# Patient Record
Sex: Female | Born: 1965 | Race: White | Hispanic: No | Marital: Married | Smoking: Never smoker
Health system: Southern US, Community
[De-identification: ages and names within clinical notes are randomized; demographics above are authoritative.]

## PROBLEM LIST (undated history)

## (undated) DIAGNOSIS — F419 Anxiety disorder, unspecified: Secondary | ICD-10-CM

## (undated) HISTORY — PX: HERNIA REPAIR: SHX51

---

## 1999-06-06 ENCOUNTER — Inpatient Hospital Stay (HOSPITAL_COMMUNITY): Admission: AD | Admit: 1999-06-06 | Discharge: 1999-06-08 | Payer: Self-pay | Admitting: Obstetrics and Gynecology

## 1999-06-13 ENCOUNTER — Encounter: Admission: RE | Admit: 1999-06-13 | Discharge: 1999-09-11 | Payer: Self-pay | Admitting: Obstetrics and Gynecology

## 1999-07-28 ENCOUNTER — Other Ambulatory Visit: Admission: RE | Admit: 1999-07-28 | Discharge: 1999-07-28 | Payer: Self-pay | Admitting: Obstetrics and Gynecology

## 2000-12-07 ENCOUNTER — Encounter (INDEPENDENT_AMBULATORY_CARE_PROVIDER_SITE_OTHER): Payer: Self-pay | Admitting: Specialist

## 2000-12-07 ENCOUNTER — Ambulatory Visit (HOSPITAL_COMMUNITY): Admission: RE | Admit: 2000-12-07 | Discharge: 2000-12-07 | Payer: Self-pay | Admitting: *Deleted

## 2004-03-31 ENCOUNTER — Inpatient Hospital Stay (HOSPITAL_COMMUNITY): Admission: RE | Admit: 2004-03-31 | Discharge: 2004-04-03 | Payer: Self-pay | Admitting: Obstetrics and Gynecology

## 2004-03-31 ENCOUNTER — Encounter (INDEPENDENT_AMBULATORY_CARE_PROVIDER_SITE_OTHER): Payer: Self-pay | Admitting: *Deleted

## 2004-04-04 ENCOUNTER — Encounter: Admission: RE | Admit: 2004-04-04 | Discharge: 2004-04-25 | Payer: Self-pay | Admitting: Obstetrics and Gynecology

## 2004-05-09 ENCOUNTER — Other Ambulatory Visit: Admission: RE | Admit: 2004-05-09 | Discharge: 2004-05-09 | Payer: Self-pay | Admitting: Obstetrics and Gynecology

## 2005-07-07 ENCOUNTER — Other Ambulatory Visit: Admission: RE | Admit: 2005-07-07 | Discharge: 2005-07-07 | Payer: Self-pay | Admitting: Obstetrics and Gynecology

## 2005-07-31 ENCOUNTER — Ambulatory Visit: Payer: Self-pay | Admitting: Internal Medicine

## 2006-09-20 ENCOUNTER — Ambulatory Visit: Payer: Self-pay | Admitting: Pulmonary Disease

## 2009-07-06 DIAGNOSIS — E669 Obesity, unspecified: Secondary | ICD-10-CM | POA: Insufficient documentation

## 2009-07-06 DIAGNOSIS — E785 Hyperlipidemia, unspecified: Secondary | ICD-10-CM | POA: Insufficient documentation

## 2009-07-13 ENCOUNTER — Ambulatory Visit: Payer: Self-pay | Admitting: Cardiology

## 2010-04-10 ENCOUNTER — Encounter: Payer: Self-pay | Admitting: Obstetrics and Gynecology

## 2010-04-21 NOTE — Assessment & Plan Note (Signed)
Summary: EC6/ELEVATED LIPIDS/SEEN DR ROSS IN THE PAST/JML  Medications Added SIMVASTATIN 40 MG TABS (SIMVASTATIN) 1 at bedtime      Allergies Added: NKDA  Visit Type:  Initial Consult Primary Provider:  Dr Henderson Cloud  CC:  hyperlipidemia.  History of Present Illness: Felicia Chen comes in today per the request of Dr.Tomblin for the evaluation and management of a severe mixed hyperlipidemia, obesity, and a premature family history of coronary artery disease.  She was evaluated by Dr. Tenny Craw in May of 2007. At that time she was placed on Vytorin. She discontinued it because she was required to come the office for a refill. 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   She is having no symptoms of angina or ischemia. She has 5 children, youngest of 4, and readily admits she allows no time for herself. She does not exercise and she isn't eating late at night with unhealthy foods including ice cream. Her weight is going up.  She is very emotional today her the fact she is failing to take care of herself. She really seems to understand diets, the importance of exercise, and taking her medications but has not had the discipline   She is having no polyuria, polydipsia, dry mouth, weight loss, orthopnea, PND, or edema.  Her last numbers showed a total cholesterol of 281, triglycerides 153, HDL 47, LDL 203 and remarkably a normal VLDL. Her glucose was normal TSH was normal hemoglobin A1c was 6%.  There is no history of liver disease.  Current Medications (verified): 1)  Multivitamins   Tabs (Multiple Vitamin) .Marland Kitchen.. 1 Tab Once Daily 2)  Zoloft 50 Mg Tabs (Sertraline Hcl) .Marland Kitchen.. 1 Tab Once Daily  Allergies (verified): No Known Drug Allergies  Past History:  Past Medical History: Last updated: 02-Aug-2009 CORONARY ARTERY DISEASE, PREMATURE, FAMILY HX (ICD-V17.3) HYPERLIPIDEMIA (ICD-272.4) OBESITY (ICD-278.00)  Past Surgical History: Last updated: 08/02/2009 Repair of umbilical hernia  with mesh. Cesearan  Family History: Last updated: Aug 02, 2009 Father: deceased @ 41...had MI @ 74 and CABG @ 61 Family History of Hyperlipidemia:  Family History of Hypertension:   Social History: Last updated: 02-Aug-2009 Tobacco Use - No.  Alcohol Use - no Drug Use - no Married  Regular Exercise - yes  Risk Factors: Exercise: yes (2009/08/02)  Risk Factors: Smoking Status: never (08-02-2009)  Review of Systems       negative other than history of present illness  Vital Signs:  Patient profile:   45 year old female Height:      68 inches Weight:      258 pounds BMI:     39.37 Pulse rate:   81 / minute BP sitting:   126 / 72  (left arm) Cuff size:   large  Vitals Entered By: Hardin Negus, RMA (July 13, 2009 11:10 AM)  Physical Exam  General:  obese.  no acute distress, emotional Head:  normocephalic and atraumatic Eyes:  PERRLA/EOM intact; conjunctiva and lids normal. Mouth:  Teeth, gums and palate normal. Oral mucosa normal. Neck:  Neck supple, no JVD. No masses, thyromegaly or abnormal cervical nodes. Chest Shea Swalley:  no deformities or breast masses noted Lungs:  Clear bilaterally to auscultation and percussion. Heart:  Non-displaced PMI, chest non-tender; regular rate and rhythm, S1, S2 without murmurs, rubs or gallops. Carotid upstroke normal, no bruit. Normal abdominal aortic size, no bruits. Femorals normal pulses, no bruits. Pedals normal pulses. No edema, no varicosities. Msk:  Back normal, normal gait. Muscle strength and tone  normal. Pulses:  pulses normal in all 4 extremities Extremities:  No clubbing or cyanosis. Neurologic:  Alert and oriented x 3. Skin:  Intact without lesions or rashes. Psych:  Normal affect.   EKG  Procedure date:  07/13/2009  Findings:      normal sinus rhythm, low voltage.  Impression & Recommendations:  Problem # 1:  HYPERLIPIDEMIA (ICD-272.4) Assessment Deteriorated I have had a long discussion with the patient  today. I recommended simvastatin 40 mg q.h.s. because she cannot afford a torn. Our goal is to lower total cholesterol less than 200 and her LDL less than 1:30 or greater. We will check followup blood work in 6 weeks. I will see her back on an annual basis in order to renew his prescription. The following medications were removed from the medication list:    Vytorin 10-10 Mg Tabs (Ezetimibe-simvastatin) .Marland Kitchen... 1 tab at bedtime Her updated medication list for this problem includes:    Simvastatin 40 Mg Tabs (Simvastatin) .Marland Kitchen... 1 at bedtime  The following medications were removed from the medication list:    Vytorin 10-10 Mg Tabs (Ezetimibe-simvastatin) .Marland Kitchen... 1 tab at bedtime  Problem # 2:  OBESITY (ICD-278.00) Assessment: Deteriorated II have spent a great amount of time, greater than 30 minutes, talking about therapeutic and healthy lifestyle changes. This includes walking 3 hours per week, take her medication, and not gaining anymore weight. I think is safe to say that she would not lose weight based on her history. She agrees with this. Orders: EKG w/ Interpretation (93000)  Problem # 3:  CORONARY ARTERY DISEASE, PREMATURE, FAMILY HX (ICD-V17.3) Assessment: Unchanged  Orders: EKG w/ Interpretation (93000)  Patient Instructions: 1)  Your physician recommends that you schedule a follow-up appointment in: YEAR  WITH DR Kota Ciancio 2)  Your physician recommends that you return for lab work in:6 WEEKS LIPID LIVER 272.4 V58.69 MID JUNE FOR FASTING LABS 8:30  3)  Your physician has recommended you make the following change in your medication: SIMVASTATIN 40 MG Prescriptions: SIMVASTATIN 40 MG TABS (SIMVASTATIN) 1 at bedtime  #30 x 11   Entered by:   Scherrie Bateman, LPN   Authorized by:   Gaylord Shih, MD, [facility]   Signed by:   Scherrie Bateman, LPN on 16/12/9602   Method used:   Electronically to        [facility] [phone]* (retail)       [address]       [place], [place]  [address]       Ph:  [phone] or [phone]       Fax: [phone]   RxID:   [id number]

## 2010-08-02 NOTE — Assessment & Plan Note (Signed)
[facility]                             PULMONARY OFFICE NOTE   Felicia Chen, Felicia Chen                    MRN:          [id number]  DATE:09/20/2006                            DOB:          1965-09-01    SLEEP MEDICINE CONSULTATION:   HISTORY OF PRESENT ILLNESS:  The patient is a 45 year old female who I  have been asked to see for sleeping difficulties.  The patient states  that she has been told that she snores but no one has ever mentioned  pauses in her breathing during sleep.  The patient typically goes to bed  between 10 and 11 and gets up at 6 a.m. to start her day during the  school year and 7 to 8 a.m. during the summer.  She is not rested upon  arising.  She will awaken 3-5 times a night and really does not know  why.  She denies any leg kicking but she admits that she is in a king-  size bed and it may not be felt by her bed partner.  She denies any  symptoms consistent with a restless legs syndrome.  The patient does  have two sisters with obstructive sleep apnea, who are on CPAP.  She  currently works within the home and denies any significant sleep  pressure during the day.  She does have some fuzziness with periods of  inactivity.  She does note decreased energy level in the afternoon and  early evening.  She may doze in the evening with TV or movies.  She has  no difficulties driving.  The patient also notes that she has gained  about 20 pounds over the last 2 years.   PAST MEDICAL HISTORY:  Significant for hypercholesterolemia and tubal  ligation, otherwise is unremarkable.   CURRENT MEDICATIONS:  1. Zoloft 50 mg daily.  2. Vytorin 10/10 mg daily.   She has no known drug allergies.   SOCIAL HISTORY:  She is married and has children.  She has never smoked.   FAMILY HISTORY:  Remarkable for father having had heart disease as well  as prostate cancer.  Her mother had uterine cancer.   REVIEW OF SYSTEMS:  As per history of  present illness, also patient  intake form documented on the chart.   PHYSICAL EXAM:  GENERAL:  She is a morbidly obese female in no acute  distress.  Blood pressure is 112/70, pulse 77, temperature is 98.7, weight is 240  pounds.  O2 saturation on room air is 96%.  HEENT:  Pupils equal, round and reactive to light and accommodation.  Extraocular muscles were intact.  Nares are patent without discharge but  do show mild septal deviation to the left.  Oropharynx does show mild  elongation of the soft palate and uvula.  NECK:  Supple without JVD or lymphadenopathy.  There is no palpable  thyromegaly.  CHEST:  Totally clear.  CARDIAC:  Regular rate and rhythm, no murmurs, rubs or gallops.  ABDOMEN:  Soft, nontender, with good bowel sounds.  GENITAL, RECTAL, BREASTS:  Exams not done and not indicated.  Lower extremities  are without edema.  Pulses are intact distally.  NEUROLOGIC:  Alert and oriented with no obvious motor deficits.   IMPRESSION:  Possible obstructive sleep apnea.  The patient certainly  has disrupted sleep as well as snoring and is not rested in the  mornings.  However, she seems to be functioning fairly well during the  day and does not feel this is overly impacting her quality of life.  The  patient does not have a lot of cardiovascular morbidities.  Because of  all this, I think that we could either go ahead and proceed with a sleep  study or also take 6 months and work aggressively on weight loss and see  if things get better.  After giving the patient the option, she would  like to work on weight loss for the next 6 months and see how things go.   PLAN:  The patient will work on weight loss for the next months.  If she  is not seeing improvement, then she will contact me about getting set up  for nocturnal polysomnography.     Barbaraann Share, MD,FCCP  Electronically Signed    [facility]/MedQ  DD: 12/25/2006  DT: 12/26/2006  Job #: [id number]   cc:   Guy Sandifer. Henderson Cloud,  M.D.

## 2010-08-05 ENCOUNTER — Other Ambulatory Visit: Payer: Self-pay | Admitting: Cardiology

## 2010-08-05 NOTE — Op Note (Signed)
[facility]  Patient:    Felicia Chen, Felicia Chen Visit Number: [id number] MRN: [id number]          Service Type: DSU Location: DAY Attending Physician:  Vikki Ports. Dictated by:   Vikki Ports, M.D. Proc. Date: 12/07/00 Admit Date:  12/07/2000                             Operative Report  PREOPERATIVE DIAGNOSIS:  Umbilical mass.  POSTOPERATIVE DIAGNOSIS:  Umbilical hernia and cyst.  PROCEDURE:  Repair of umbilical hernia with mesh.  SURGEON:  Vikki Ports, M.D.  ANESTHESIA:  General.  DESCRIPTION OF PROCEDURE:  The patient was taken to the operating room and placed in the supine position.  After adequate general anesthesia was induced using laryngeal mask, the abdomen was prepped and draped in the normal sterile fashion.  Using a transverse infraumbilical incision, I dissected down on to the umbilical stalk.  There was a blue hemorrhagic cyst on the umbilical stalk, and as it was being dissected off, there was a small button hole deformity made in the inferior portion of the umbilicus.  This was repaired with a 4-0 Vicryl subcuticularly.  The hernia sac was then dissected free of surrounding structures and excised.  There was a small amount of momentum within the hernia.  The defect was only the size of my fingertip.  It was closed with figure-of-eight 0 Surgilon sutures.  A piece of Atrium mesh was then placed over the repair and tacked on the periphery using a 2-0 Prolene suture.  All tissues were injected using 0.5 Marcaine.  The skin was closed with subcuticular 4-0 Monocryl.  Dermabond dressing was placed.  A sterile dressing was applied.  The patient tolerated the procedure well and went to PACU in good condition. Dictated by:   Vikki Ports, M.D. Attending Physician:  Danna Hefty R. DD:  12/07/00 TD:  12/07/00 Job: [id number]

## 2010-08-05 NOTE — H&P (Signed)
[facility]  Patient:    Felicia Chen, Felicia Chen                  MRN: [id number] Adm. Date:  [id number] Attending:  Madelyn Flavors CC:         [facility]                         History and Physical  HISTORY OF PRESENT ILLNESS:   The patient is a 45 year old gravida 3, para 1-2-0-3 white female, [facility] June 15, 1999, admitted for elective repeat cesarean section. The patient declines a VBAC, having undergone a cesarean section with her first  delivery in 41 and then for twins in 1997.  Antenatal course has been totally and completely uncomplicated with the exception of some slight dilation of the left  fetal renal pelvis, which has been stable.  Patient is made aware of the risks f surgery including anesthetic complication, hemorrhage or infection, damage to adjacent structures including bladder, bowel, blood vessels or ureter.  She is ade aware of the risk of hemorrhage which could result in blood transfusion which could result in complications such as HIV or hepatitis.  She expresses understanding f and acceptances of these risks.  PAST MEDICAL HISTORY:         Preterm labor at 32 weeks with her twin pregnancy. C-sections x 2, as above.  Advanced maternal age.  FAMILY HISTORY:               Please see [place].  ALLERGIES:                    No known drug allergies.  MEDICATIONS:                  Prenatal vitamins.  PHYSICAL EXAMINATION:  NECK:                         Supple without thyromegaly.  LUNGS:                        Clear to auscultation.  CARDIAC:                      Regular rate and rhythm.  ABDOMEN:                      Gravida, nontender.  No hepatosplenomegaly.  ASSESSMENT AND PLAN:          Patient is a 45 year old gravida 3, para 1-2-0-3 white female, currently at 38-5/7 weeks, admitted for elective repeat cesarean section at term.  Risks have been explained to the patient and she  expresses understanding of and acceptance of these risks. DD:  06/06/99 TD:  06/06/99 Job: [id number]

## 2010-08-05 NOTE — Discharge Summary (Signed)
NAME:  Felicia, Chen NO.:  [id number]   MEDICAL RECORD NO.:  [id number]          PATIENT TYPE:  INP   LOCATION:  9105                           FACILITY:   PHYSICIAN:  Freddy Finner, M.D.   DATE OF BIRTH:  1965-08-23   DATE OF ADMISSION:  03/31/2004  DATE OF DISCHARGE:  04/03/2004                                 DISCHARGE SUMMARY   ADMISSION DIAGNOSES:  1.  Intrauterine pregnancy at 38-1/2 weeks estimated gestational age.  2.  Previous cesarean section, desires repeat.  3.  Multiparity, desires permanent sterilization.   DISCHARGE DIAGNOSES:  1.  Status post low transverse cesarean section.  2.  Viable female infant.  3.  Permanent sterilization.   PROCEDURE:  1.  Repeat low transverse cesarean section.  2.  Bilateral tubal ligation.   REASON FOR ADMISSION:  Please see dictated H&P.   HOSPITAL COURSE:  The patient was a 45 year old, white, married female,  gravida 4, para 4 that had a previous cesarean section and desired repeat.  The patient also desired permanent sterilization.  On the morning of  admission, the patient was taken to the operating room where a spinal  anesthesia was administered without difficulty.  A low transverse incision  was made with the delivery of a viable female infant weighing 8 pounds 9  ounces with Apgars of 8 at 1 minute and 9 at 5 minutes.  Arterial cord pH  was 7.27.  Bilateral tubal ligation was performed without difficulty.  The  patient tolerated the procedure well and was taken to the recovery room in  stable condition.  On postoperative day #1, the patient was without  complaint.  She had good relief from the OnQ system.  Vital signs were  stable.  She was afebrile.  Abdomen soft with good return of bowel function.  The fundus was firm and non-tender.  Abdominal dressing was clean, dry, and  intact.  The Foley catheter had been discontinued.  The patient was voiding  with good output.  Laboratories revealed a  hemoglobin of 10.4.   On postoperative day #2, the patient reported poor results from OnQ system.  Vital signs were stable.  She was afebrile.  The fundus was firm and non-  tender.  Laboratories revealed hemoglobin of 10.4.  The dressing was  removed.  OnQ was removed.  The incision was clean, dry, and intact.  She  was ambulating well and tolerating a regular diet without complaints of  nausea or vomiting.  On postoperative day #3, the patient was without  complaint.  Vital signs were stable.  She was afebrile.  Her fundus was firm  and non-tender.  Her incision was clean, dry, and intact.  Staples were  removed and the patient was discharged home.   CONDITION ON DISCHARGE:  Good.   DIET:  Regular, as tolerated.   DISCHARGE ACTIVITIES:  No heavy lifting.  No driving x2 weeks.  No vaginal  entry.   FOLLOW UP:  The patient is to followup in the office in 1-2 weeks for an  incision check.  She is to call for temperature greater  than 100 degrees,  persistent nausea or vomiting, heavy vaginal bleeding, any redness or  drainage from the incisional site.   DISCHARGE MEDICATIONS:  1.  Percocet 5/325 mg #30 one p.o. q.4-6h. p.r.n.  2.  Motrin 600 mg every six hours.  3.  Prenatal vitamins one p.o. daily.  4.  Colace 100 mg daily p.r.n.      CC/MEDQ  D:  04/20/2004  T:  04/20/2004  Job:  [id number]

## 2010-08-05 NOTE — H&P (Signed)
NAME:  Felicia Chen, Felicia Chen NO.:  [id number]   MEDICAL RECORD NO.:  [id number]          PATIENT TYPE:  INP   LOCATION:  NA                            FACILITY:  [facility]   PHYSICIAN:  Guy Sandifer. Tomblin II, M.D.DATE OF BIRTH:  December 10, 1965   DATE OF ADMISSION:  DATE OF DISCHARGE:                                HISTORY & PHYSICAL   CHIEF COMPLAINT:  Desires repeat cesarean section.   HISTORY OF PRESENT ILLNESS:  This patient is a 45 year old married white  female, G4, P4 (twins), with an EDC of April 08, 2004, consistent with  early ultrasound.  Prenatal care has been complicated by three previous  cesarean sections.  Also evaluation for first trimester nuchal lucency  screening with Dr. Sherrie George was within normal limits.  Potential risks and  complications have been reviewed preoperatively.  The patient also requests  tubal ligation.  Permanence of the procedure, alternatives, increased  ectopic risk, and failure rate have been reviewed.   Past medical history, past surgical history, family history, obstetric  history:  See prenatal history and physical.   MEDICATIONS:  Prenatal vitamins.   ALLERGIES:  No known drug allergies.   PHYSICAL EXAMINATION:  VITAL SIGNS:  Height 5 feet 8 inches, weight 238  pounds, blood pressure 124/72.  HEENT:  Without thyromegaly.  LUNGS:  Clear to auscultation.  HEART:  Regular rate and rhythm.  BACK:  Without CVA tenderness.  BREASTS:  Not examined.  ABDOMEN:  Gravid.  No epigastric tenderness.  CERVIX:  Not examined.  EXTREMITIES:  2+ edema.   ASSESSMENT:  Intrauterine pregnancy at 38-1/2 weeks' estimated gestational  age with previous cesarean section.  Desires repeat.   PLAN:  Repeat cesarean section and tubal ligations.      JET/MEDQ  D:  03/30/2004  T:  03/30/2004  Job:  [id number]

## 2010-08-05 NOTE — Discharge Summary (Signed)
[facility]  Patient:    Felicia Chen, Felicia Chen                  MRN: [id number] Adm. Date:  [id number] Disc. Date: 06/08/99 Attending:  Madelyn Flavors Dictator:   Danie Chandler, R.N.                           Discharge Summary  ADMISSION DIAGNOSES:          1. Intrauterine pregnancy at term.                               2. Declines vaginal birth after cesarean section.  DISCHARGE DIAGNOSES:          1. Intrauterine pregnancy at term.                               2. Declines vaginal birth after cesarean section.  PROCEDURE:                    On June 06, 1999, repeat low transverse cesarean  section.  REASON FOR ADMISSION:         The patient is a 45 year old, gravida 3, para 2, married white female with an estimated date of confinement of June 15, 1999. he patient has been admitted to undergo elective repeat cesarean section.  The patient declines a VBAC.  Her antenatal course has been uncomplicated.  HOSPITAL COURSE:              The patient is taken to the operating room and undergoes the above named procedure without complications.  This is productive f a viable female infant with Apgars of 8 at one minute and 9 at five minutes and an arterial cord pH of 7.19.  Postoperatively, the patient does well.  On postoperative day #1, the patient had a good return of bowel function and was tolerating a regular diet.  She was also ambulating well without difficulty and had good pain control.  Her hemoglobin on this day was 9.5, hematocrit 27.5, and white blood cell count 7.9.  She was started on iron daily and had a repeat CBC ordered for postoperative day #2.  On postoperative day #2, the patient desired discharge home.  She was without complaint.  Her hemoglobin was stable at 10.5.  CONDITION ON DISCHARGE:       Good.  DIET:                         Regular as tolerated.  ACTIVITY:                     No heavy lifting, no driving,  and no vaginal entry. She is to follow up in the office in six days for staple removal and she is to all for a temperature greater than 100 degrees, persistent nausea and vomiting, heavy vaginal bleeding, and/or redness or drainage from the incision site.  DISCHARGE MEDICATIONS:        1. Prenatal vitamins one p.o. q.d.                               2. Tylox #30 one to two p.o. q.3-4h. p.r.n. pain.  3. Ibuprofen 600 mg p.o. q.6h. p.r.n. pain. DD:  06/08/99 TD:  06/08/99 Job: [id number]

## 2010-08-05 NOTE — Op Note (Signed)
[facility]  Patient:    Felicia Chen, Felicia Chen                  MRN: [id number] Proc. Date: 06/06/99 Adm. Date:  [id number] Attending:  Madelyn Flavors                           Operative Report  PREOPERATIVE DIAGNOSIS:  Intrauterine pregnancy, term, declines vaginal birth after cesarean section.  POSTOPERATIVE DIAGNOSIS:  Intrauterine pregnancy, term, declines vaginal birth after cesarean section.  OPERATION:  Repeat cesarean section, low cervical transverse.  SURGEON:  Beather Arbour. Thomasena Edis, M.D.  ASSISTANT:  Guy Sandifer. Arleta Creek, M.D.  ANESTHESIA:  Spinal.  ESTIMATED BLOOD LOSS:  800 cc.  FLUID REPLACEMENT:  3000 cc of crystalloid and 1 gm Ancef IV.  COMPLICATIONS:  None.  DRAINS:  Foley.  DESCRIPTION OF PROCEDURE:  The patient was brought to the operating room and identified on the operating table.  After induction of adequate spinal anesthesia, the patient was placed in the left uterine displacement position and prepped and draped in the usual sterile fashion.  A Pfannenstiel incision was made through he patients previous incision and carried down to the fascia.  The fascia was scored in the midline and extended bilaterally using Mayo scissors.  It was then separated free from the underlying muscles.  The muscles were separated in the midline down to the symphysis.  The peritoneum was entered sharply and carefully taking care to avoid bowel or other abdominal contents.  The peritoneal incision was extended superiorly and inferiorly down to the bladder edge.  The bladder blade was placed. The bladder flap was developed.  The uterus was scored in the lower uterine segment and the incision was extended in a curvilinear fashion using bandage scissors. The amniotic cavity was entered and there was noted to be clear fluid.  All instruments were removed from the operative and the fetal vertex was delivered via vacuum extraction onto  the operative field.  The infant was bulb suction and the remainder of the infant was delivered onto the operative field without difficulty. The cord was doubly clamped and cut and the baby was handed to the awaiting neonatal resuscitation team.  The placenta was manually removed.  The bladder was placed. Spring clamps were placed on the uterus and the uterus was closed using simple running interlocking suture of 0 Monocryl.  Figure-of-eight sutures of 0 Monocryl were placed for hemostasis of any bleeding edges of the uterine incision. After noting excellent hemostasis, the incision was copiously irrigated with warm lactated ringers.  The bladder flap was noted to be very hemostatic.  The gutters were irrigated free of clots.  At that point the subfascial areas were examined for evidence of hemostasis and adequate hemostasis was achieved using cautery.  The  fascia was closed using a suture of 0 PDS with a suture anchored at the leftmost angle of the incision and run to the rightmost angle of the incision and tied. The subcutaneous tissue was irrigated copiously with warm lactated Ringers after assuring excellent fascial closure.  The subcutaneous tissue was then irrigated  copiously with warm lactated Ringers and excellent hemostasis achieved using cautery.  Skin was closed with staples and Steri-Strips were applied.  The patient tolerated the procedure well without apparent complications and transferred to recovery room in stable condition after all sponge, instrument and needle counts were correct.  The baby was found to be  a 9 pound 2 ounce female Apgars 8 and 10, cord pH 7.19 and went to the normal newborn nursery. DD:  06/06/99 TD:  06/07/99 Job: [id number]

## 2010-08-05 NOTE — Op Note (Signed)
NAME:  Felicia Chen, Felicia Chen NO.:  [id number]   MEDICAL RECORD NO.:  [id number]          PATIENT TYPE:  INP   LOCATION:  9105                          FACILITY:  [facility]   PHYSICIAN:  Guy Sandifer. Tomblin II, M.D.DATE OF BIRTH:  1965-10-20   DATE OF PROCEDURE:  03/31/2004  DATE OF DISCHARGE:                                 OPERATIVE REPORT   PREOPERATIVE DIAGNOSIS:  1.  Intrauterine pregnancy at 5 1/2 weeks estimated gestational age.  2.  Previous cesarean section.  3.  Desires permanent sterilization.   POSTOPERATIVE DIAGNOSIS:  1.  Intrauterine pregnancy at 66 1/2 weeks estimated gestational age.  2.  Previous cesarean section.  3.  Desires permanent sterilization.   PROCEDURE:  1.  Repeat low transverse cesarean section.  2.  Bilateral tubal ligation.  3.  ON-Q system.   SURGEON:  Guy Sandifer. Henderson Cloud, M.D.   ANESTHESIA:  Spinal.   ANESTHESIOLOGIST:  Belva Agee, M.D.   ESTIMATED BLOOD LOSS:  800 mL.   SPECIMENS:  Bilateral fallopian tube segments.   FINDINGS:  Viable female infant, Apgars 8 and 9 at 1 and 5 minutes  respectively, birth weight 8 pounds 9 ounces.  Arterial cord pH 7.27.   INDICATIONS AND CONSENT:  This patient is a 45 year old married white  female, G4, P4, (twins) with an EDC of April 08, 2004.  She has had a  previous cesarean section and desires repeat.  She also desires permanent  sterilization.  Alternate methods of contraception have been reviewed.  The  potential risks and complications are reviewed preoperatively including but  not limited to infection, bowel, bladder, or ureteral damage, bleeding  requiring transfusion of blood products and possible transfusion reaction,  HIV, and hepatitis acquisition, DVT, PE, and pneumonia.  Permanence of tubal  ligation, failure rate, and increased ectopic risks are also reviewed.  All  questions are answered and consent is signed on the chart.   DESCRIPTION OF PROCEDURE:  The patient was  taken to the operating room where  she was identified, spinal anesthetic was placed, and she was placed in a  dorsal supine position with a 15 degree left lateral wedge.  She is then  prepped, Foley catheter placed and the bladder is drained, and she was  draped in a sterile fashion.  After testing for adequate spinal incision,  the skin was entered through the previous Pfannenstiel scar and dissection  was carried out in layers to the peritoneum.  The adhesions in the  subfascial layer are quite dense.  The vesicouterine peritoneum was taken  down cephalolaterally.  The bladder flap was developed and the bladder blade  was placed.  The uterus was incised in a low transverse manner and the  uterine cavity was entered bluntly with a hemostat.  The uterine incision  was extended cephalolaterally with the fingers.  Artificial rupture of  membranes for clear fluid was carried out.  The baby is in the oblique  position with the head in the right lower quadrant.  The vertex continued to  try to turn transverse.  After exposing the vertex, the Tender Touch Vacuum  Extractor was placed.  This brings the head in the incision without  difficulty.  A small T-extension approximately 3 cm in the midline in the  upper part of the incision was also done to create adequate room and then  the vertex easily delivers.  Oronasopharynx was suctioned.  The remainder of  the baby is delivered.  A good cry and tone was noted.  The cord was clamped  and cut and the baby was handed to the awaiting pediatrics team.  The  placenta was then delivered.  The uterine cavity was cleaned.  The T-shaped  incision of the uterine extension is closed with two running locking layers  of 0 Monocryl suture which achieved good hemostasis.  The uterine incision  was next closed in two running locking imbricating layers of 0 Monocryl.  The left fallopian tube was then identified from cornua to fimbria.  There  were several filmy  adhesions of the tube which were taken down sharply  without difficulty.  The fallopian tube was then grasped in its mid portion  with a Babcock clamp.  The intervening knuckle of tube was then doubly  ligated with two free ties of 0 plain suture.  The intervening knuckle was  then resected.  Cautery was used to obtain complete hemostasis.  A similar  procedure was carried out on the right tube, although there were no  adhesions on this side.  Good hemostasis is noted on both sides.  After  irrigation, the anterior peritoneum was closed in a running fashion with 0  Monocryl suture which was also used to reapproximate the pyramidalis muscle  in the midline.  The anterior rectus fascia was closed in a running fashion  with 0 PDS suture.  A port for the ON-Q is then placed to the left of the  midline above the incision and the catheter is placed subcutaneously.  The  incision is then closed with 3-0 Monocryl in a subcuticular fashion.  The  catheter is primed.  Dressings are applied.  All counts were correct.  The  patient is taken to the recovery room in stable condition.      JET/MEDQ  D:  03/31/2004  T:  03/31/2004  Job:  [id number]

## 2010-11-30 ENCOUNTER — Other Ambulatory Visit: Payer: Self-pay | Admitting: Cardiology

## 2010-12-24 ENCOUNTER — Other Ambulatory Visit: Payer: Self-pay | Admitting: Cardiology

## 2011-07-28 ENCOUNTER — Other Ambulatory Visit: Payer: Self-pay | Admitting: Cardiology

## 2011-08-24 ENCOUNTER — Other Ambulatory Visit: Payer: Self-pay | Admitting: Cardiology

## 2011-10-06 ENCOUNTER — Other Ambulatory Visit: Payer: Self-pay | Admitting: Cardiology

## 2011-12-14 ENCOUNTER — Other Ambulatory Visit: Payer: Self-pay | Admitting: Cardiology

## 2011-12-14 NOTE — Telephone Encounter (Signed)
LMOM for pt to call to make appt

## 2012-05-27 ENCOUNTER — Encounter (HOSPITAL_COMMUNITY): Payer: Self-pay

## 2012-06-05 ENCOUNTER — Encounter (HOSPITAL_COMMUNITY)
Admission: RE | Admit: 2012-06-05 | Discharge: 2012-06-05 | Disposition: A | Discharge status: Home / Self Care | Payer: Managed Care, Other (non HMO) | Source: Ambulatory Visit | Attending: Obstetrics and Gynecology | Admitting: Obstetrics and Gynecology

## 2012-06-05 ENCOUNTER — Encounter (HOSPITAL_COMMUNITY): Payer: Self-pay

## 2012-06-05 HISTORY — DX: Gilbert syndrome: E80.4

## 2012-06-05 HISTORY — DX: Anxiety disorder, unspecified: F41.9

## 2012-06-05 LAB — CBC
HCT: 39.7 % (ref 36.0–46.0)
Hemoglobin: 13.7 g/dL (ref 12.0–15.0)
MCHC: 34.5 g/dL (ref 30.0–36.0)
MCV: 88 fL (ref 78.0–100.0)
RDW: 13 % (ref 11.5–15.5)
WBC: 9.9 10*3/uL (ref 4.0–10.5)

## 2012-06-05 LAB — SURGICAL PCR SCREEN: Staphylococcus aureus: NEGATIVE

## 2012-06-05 NOTE — Patient Instructions (Signed)
Your procedure is scheduled on: 06/11/12  Enter through the Main Entrance at :6am Pick up desk phone and dial [phone] and inform us of your arrival.  Please call [phone] if you have any problems the morning of surgery.  Remember: Do not eat or drink after midnight:Monday   DO NOT wear jewelry, eye make-up, lipstick,body lotion, or dark fingernail polish. Do not shave for 48 hours prior to surgery.  If you are to be admitted after surgery, leave suitcase in car until your room has been assigned. Patients discharged on the day of surgery will not be allowed to drive home.

## 2012-06-07 NOTE — H&P (Signed)
Felicia Chen is an 47 y.o. female. She has known fibroids and increasingly heavy bleeding, sometimes for more than a week. U/S C/W multiple fibroids up to 4 cm. Ovaries appear normal.  Pertinent Gynecological History: Menses: flow is excessive with use of many pads or tampons on heaviest days Bleeding: dysfunctional uterine bleeding Contraception: tubal ligation DES exposure: denies Blood transfusions: none Sexually transmitted diseases: no past history Previous GYN Procedures: none  Last mammogram: normal Date: 2013 Last pap: normal Date: 2013 OB History: G4, P5   Menstrual History: Menarche age: unknown  No LMP recorded.    Past Medical History  Diagnosis Date  . Anxiety   . Gilbert's syndrome     Past Surgical History  Procedure Laterality Date  . Hernia repair    . Cesarean section      x 4    No family history on file.  Social History:  reports that she has never smoked. She does not have any smokeless tobacco history on file. She reports that  drinks alcohol. She reports that she does not use illicit drugs.  Allergies: No Known Allergies  No prescriptions prior to admission    Review of Systems  Constitutional: Negative for fever.  Respiratory: Negative for shortness of breath.     There were no vitals taken for this visit. Physical Exam  Constitutional: She appears well-developed and well-nourished.  Cardiovascular: Normal rate and regular rhythm.   Respiratory: Effort normal and breath sounds normal.  GI: There is no tenderness.  Genitourinary:  Uterus 12-14 weeks size Adnexa NT without masses  Neurological: She has normal reflexes.    No results found for this or any previous visit (from the past 24 hour(s)).  No results found.  Assessment/Plan: 47 yo with menorrhagia and fibroids for LAVH/BSO. Risks reviewed including infection, organ damage, DVT/PE, pneumonia, bleeding/transfusion-HIV/Hep, fistula, pelvic/abdominal pain, dyspareunia,  laparotomy. All questions answered.  Felicia Chen,Evanne Matsunaga E 06/07/2012, 12:44 PM

## 2012-06-10 ENCOUNTER — Encounter (HOSPITAL_COMMUNITY): Payer: Self-pay | Admitting: Anesthesiology

## 2012-06-10 NOTE — Anesthesia Preprocedure Evaluation (Addendum)
Anesthesia Evaluation  Patient identified by MRN, date of birth, ID band Patient awake    Reviewed: Allergy & Precautions, H&P , NPO status , Patient's Chart, lab work & pertinent test results  Airway Mallampati: III TM Distance: >3 FB Neck ROM: Full    Dental no notable dental hx. (+) Teeth Intact   Pulmonary neg pulmonary ROS,  Snores occasionally breath sounds clear to auscultation        Cardiovascular negative cardio ROS  Rhythm:Regular Rate:Normal     Neuro/Psych Anxiety negative neurological ROS  negative psych ROS   GI/Hepatic negative GI ROS, Gilbert's Syndrome   Endo/Other  Morbid obesityHyperlipidemia  Renal/GU negative Renal ROS   SUI    Musculoskeletal negative musculoskeletal ROS (+)   Abdominal (+) + obese,   Peds  Hematology negative hematology ROS (+)   Anesthesia Other Findings   Reproductive/Obstetrics Fibroids                           Anesthesia Physical Anesthesia Plan  ASA: III  Anesthesia Plan: General   Post-op Pain Management:    Induction: Intravenous  Airway Management Planned: Oral ETT  Additional Equipment:   Intra-op Plan:   Post-operative Plan: Extubation in OR  Informed Consent: I have reviewed the patients History and Physical, chart, labs and discussed the procedure including the risks, benefits and alternatives for the proposed anesthesia with the patient or authorized representative who has indicated his/her understanding and acceptance.   Dental advisory given  Plan Discussed with: CRNA, Anesthesiologist and Surgeon  Anesthesia Plan Comments:         Anesthesia Quick Evaluation

## 2012-06-11 ENCOUNTER — Inpatient Hospital Stay (HOSPITAL_COMMUNITY)
Admission: RE | Admit: 2012-06-11 | Discharge: 2012-06-13 | DRG: 743 | Disposition: A | Discharge status: Home / Self Care | Payer: Managed Care, Other (non HMO) | Source: Ambulatory Visit | Attending: Obstetrics and Gynecology | Admitting: Obstetrics and Gynecology

## 2012-06-11 ENCOUNTER — Ambulatory Visit (HOSPITAL_COMMUNITY): Payer: Managed Care, Other (non HMO) | Admitting: Anesthesiology

## 2012-06-11 ENCOUNTER — Encounter (HOSPITAL_COMMUNITY): Payer: Self-pay | Admitting: Anesthesiology

## 2012-06-11 ENCOUNTER — Encounter (HOSPITAL_COMMUNITY)
Admission: RE | Disposition: A | Discharge status: Home / Self Care | Payer: Self-pay | Source: Ambulatory Visit | Attending: Obstetrics and Gynecology

## 2012-06-11 ENCOUNTER — Encounter (HOSPITAL_COMMUNITY): Payer: Self-pay | Admitting: *Deleted

## 2012-06-11 DIAGNOSIS — N803 Endometriosis of pelvic peritoneum, unspecified: Secondary | ICD-10-CM | POA: Diagnosis present

## 2012-06-11 DIAGNOSIS — N838 Other noninflammatory disorders of ovary, fallopian tube and broad ligament: Secondary | ICD-10-CM | POA: Diagnosis present

## 2012-06-11 DIAGNOSIS — Z5331 Laparoscopic surgical procedure converted to open procedure: Secondary | ICD-10-CM

## 2012-06-11 DIAGNOSIS — D259 Leiomyoma of uterus, unspecified: Secondary | ICD-10-CM | POA: Diagnosis present

## 2012-06-11 DIAGNOSIS — IMO0002 Reserved for concepts with insufficient information to code with codable children: Secondary | ICD-10-CM | POA: Diagnosis present

## 2012-06-11 DIAGNOSIS — N736 Female pelvic peritoneal adhesions (postinfective): Secondary | ICD-10-CM | POA: Diagnosis present

## 2012-06-11 DIAGNOSIS — N920 Excessive and frequent menstruation with regular cycle: Principal | ICD-10-CM | POA: Diagnosis present

## 2012-06-11 DIAGNOSIS — N949 Unspecified condition associated with female genital organs and menstrual cycle: Secondary | ICD-10-CM | POA: Diagnosis present

## 2012-06-11 HISTORY — PX: ABDOMINAL HYSTERECTOMY: SHX81

## 2012-06-11 HISTORY — PX: SALPINGOOPHORECTOMY: SHX82

## 2012-06-11 HISTORY — PX: LAPAROSCOPIC ASSISTED VAGINAL HYSTERECTOMY: SHX5398

## 2012-06-11 HISTORY — PX: CYSTOSCOPY: SHX5120

## 2012-06-11 HISTORY — PX: LAPAROSCOPIC LYSIS OF ADHESIONS: SHX5905

## 2012-06-11 SURGERY — HYSTERECTOMY, VAGINAL, LAPAROSCOPY-ASSISTED
Anesthesia: General | Site: Bladder | Wound class: Clean Contaminated

## 2012-06-11 MED ORDER — CEFAZOLIN SODIUM-DEXTROSE 2-3 GM-% IV SOLR
INTRAVENOUS | Status: AC
  Filled 2012-06-11: qty 50

## 2012-06-11 MED ORDER — HYDROMORPHONE HCL PF 1 MG/ML IJ SOLN
INTRAMUSCULAR | Status: AC
  Administered 2012-06-11: 0.5 mg via INTRAVENOUS
  Filled 2012-06-11: qty 1

## 2012-06-11 MED ORDER — DOCUSATE SODIUM 100 MG PO CAPS
100.0000 mg | ORAL_CAPSULE | Freq: Two times a day (BID) | ORAL | Status: DC
  Administered 2012-06-12 – 2012-06-13 (×3): 100 mg via ORAL
  Filled 2012-06-11 (×3): qty 1

## 2012-06-11 MED ORDER — CEFAZOLIN SODIUM-DEXTROSE 2-3 GM-% IV SOLR
2.0000 g | Freq: Three times a day (TID) | INTRAVENOUS | Status: AC
  Administered 2012-06-11 (×2): 2 g via INTRAVENOUS
  Filled 2012-06-11 (×3): qty 50

## 2012-06-11 MED ORDER — DEXAMETHASONE SODIUM PHOSPHATE 4 MG/ML IJ SOLN
INTRAMUSCULAR | Status: DC | PRN
  Administered 2012-06-11: 10 mg via INTRAVENOUS

## 2012-06-11 MED ORDER — INDIGOTINDISULFONATE SODIUM 8 MG/ML IJ SOLN
INTRAMUSCULAR | Status: AC
  Filled 2012-06-11: qty 5

## 2012-06-11 MED ORDER — PHENYLEPHRINE HCL 10 MG/ML IJ SOLN
INTRAMUSCULAR | Status: DC | PRN
  Administered 2012-06-11 (×3): 40 ug via INTRAVENOUS

## 2012-06-11 MED ORDER — PROPOFOL 10 MG/ML IV EMUL
INTRAVENOUS | Status: DC | PRN
  Administered 2012-06-11: 250 mg via INTRAVENOUS

## 2012-06-11 MED ORDER — GLYCOPYRROLATE 0.2 MG/ML IJ SOLN
INTRAMUSCULAR | Status: AC
  Filled 2012-06-11: qty 1

## 2012-06-11 MED ORDER — OXYCODONE-ACETAMINOPHEN 5-325 MG PO TABS
1.0000 | ORAL_TABLET | ORAL | Status: DC | PRN
  Administered 2012-06-12: 1 via ORAL
  Administered 2012-06-12 – 2012-06-13 (×4): 2 via ORAL
  Administered 2012-06-13: 1 via ORAL
  Filled 2012-06-11 (×3): qty 2
  Filled 2012-06-11: qty 1
  Filled 2012-06-11: qty 2
  Filled 2012-06-11: qty 1

## 2012-06-11 MED ORDER — ALUM & MAG HYDROXIDE-SIMETH 200-200-20 MG/5ML PO SUSP: 30.0000 mL | ORAL | Status: DC | PRN

## 2012-06-11 MED ORDER — HYDROMORPHONE HCL PF 1 MG/ML IJ SOLN
0.2500 mg | INTRAMUSCULAR | Status: DC | PRN
  Administered 2012-06-11: 0.5 mg via INTRAVENOUS

## 2012-06-11 MED ORDER — ROCURONIUM BROMIDE 100 MG/10ML IV SOLN
INTRAVENOUS | Status: DC | PRN
  Administered 2012-06-11 (×5): 10 mg via INTRAVENOUS
  Administered 2012-06-11: 40 mg via INTRAVENOUS

## 2012-06-11 MED ORDER — BUPIVACAINE LIPOSOME 1.3 % IJ SUSP
INTRAMUSCULAR | Status: DC | PRN
  Administered 2012-06-11: 70 mL

## 2012-06-11 MED ORDER — BUPIVACAINE HCL (PF) 0.5 % IJ SOLN
INTRAMUSCULAR | Status: DC | PRN
  Administered 2012-06-11: 7 mL

## 2012-06-11 MED ORDER — ACETAMINOPHEN 10 MG/ML IV SOLN: 1000.0000 mg | Freq: Once | INTRAVENOUS | Status: DC

## 2012-06-11 MED ORDER — DEXAMETHASONE SODIUM PHOSPHATE 10 MG/ML IJ SOLN
INTRAMUSCULAR | Status: AC
  Filled 2012-06-11: qty 1

## 2012-06-11 MED ORDER — CEFAZOLIN SODIUM-DEXTROSE 2-3 GM-% IV SOLR
2.0000 g | INTRAVENOUS | Status: AC
  Administered 2012-06-11: 2 g via INTRAVENOUS
  Filled 2012-06-11: qty 50

## 2012-06-11 MED ORDER — SODIUM CHLORIDE 0.9 % IJ SOLN
INTRAMUSCULAR | Status: DC | PRN
  Administered 2012-06-11: 10 mL

## 2012-06-11 MED ORDER — SODIUM CHLORIDE 0.9 % IJ SOLN: Freq: Once | INTRAMUSCULAR | Status: DC

## 2012-06-11 MED ORDER — GLYCOPYRROLATE 0.2 MG/ML IJ SOLN
INTRAMUSCULAR | Status: DC | PRN
  Administered 2012-06-11 (×2): 0.1 mg via INTRAVENOUS

## 2012-06-11 MED ORDER — PROPOFOL 10 MG/ML IV EMUL
INTRAVENOUS | Status: AC
  Filled 2012-06-11: qty 20

## 2012-06-11 MED ORDER — ONDANSETRON HCL 4 MG/2ML IJ SOLN: 4.0000 mg | Freq: Four times a day (QID) | INTRAMUSCULAR | Status: DC | PRN

## 2012-06-11 MED ORDER — ROCURONIUM BROMIDE 50 MG/5ML IV SOLN
INTRAVENOUS | Status: AC
  Filled 2012-06-11: qty 1

## 2012-06-11 MED ORDER — PHENYLEPHRINE 40 MCG/ML (10ML) SYRINGE FOR IV PUSH (FOR BLOOD PRESSURE SUPPORT)
PREFILLED_SYRINGE | INTRAVENOUS | Status: AC
  Filled 2012-06-11: qty 5

## 2012-06-11 MED ORDER — SCOPOLAMINE 1 MG/3DAYS TD PT72
MEDICATED_PATCH | TRANSDERMAL | Status: AC
  Administered 2012-06-11: 1.5 mg via TRANSDERMAL
  Filled 2012-06-11: qty 1

## 2012-06-11 MED ORDER — INDIGOTINDISULFONATE SODIUM 8 MG/ML IJ SOLN
INTRAMUSCULAR | Status: DC | PRN
  Administered 2012-06-11: 5 mL via INTRAVENOUS

## 2012-06-11 MED ORDER — NEOSTIGMINE METHYLSULFATE 1 MG/ML IJ SOLN
INTRAMUSCULAR | Status: DC | PRN
  Administered 2012-06-11: 3 mg via INTRAVENOUS

## 2012-06-11 MED ORDER — ONDANSETRON HCL 4 MG/2ML IJ SOLN
INTRAMUSCULAR | Status: AC
  Filled 2012-06-11: qty 2

## 2012-06-11 MED ORDER — MEPERIDINE HCL 25 MG/ML IJ SOLN: 6.2500 mg | INTRAMUSCULAR | Status: DC | PRN

## 2012-06-11 MED ORDER — MIDAZOLAM HCL 5 MG/5ML IJ SOLN
INTRAMUSCULAR | Status: DC | PRN
  Administered 2012-06-11: 2 mg via INTRAVENOUS

## 2012-06-11 MED ORDER — KETOROLAC TROMETHAMINE 30 MG/ML IJ SOLN
INTRAMUSCULAR | Status: AC
  Filled 2012-06-11: qty 1

## 2012-06-11 MED ORDER — HYDROMORPHONE 0.3 MG/ML IV SOLN
INTRAVENOUS | Status: DC
  Administered 2012-06-11: 12:00:00 via INTRAVENOUS
  Administered 2012-06-11: 0.999 mg via INTRAVENOUS
  Administered 2012-06-11 (×2): 1.19 mg via INTRAVENOUS
  Administered 2012-06-12: 22 mL via INTRAVENOUS
  Administered 2012-06-12: 1.59 mg via INTRAVENOUS
  Administered 2012-06-12: 0.399 mg via INTRAVENOUS
  Filled 2012-06-11: qty 25

## 2012-06-11 MED ORDER — DEXTROSE 5 % IV SOLN: 3.0000 g | INTRAVENOUS | Status: DC

## 2012-06-11 MED ORDER — EPHEDRINE 5 MG/ML INJ
INTRAVENOUS | Status: AC
  Filled 2012-06-11: qty 10

## 2012-06-11 MED ORDER — SODIUM CHLORIDE 0.9 % IJ SOLN: 9.0000 mL | INTRAMUSCULAR | Status: DC | PRN

## 2012-06-11 MED ORDER — MIDAZOLAM HCL 2 MG/2ML IJ SOLN
INTRAMUSCULAR | Status: AC
  Filled 2012-06-11: qty 2

## 2012-06-11 MED ORDER — ONDANSETRON HCL 4 MG/2ML IJ SOLN
INTRAMUSCULAR | Status: DC | PRN
  Administered 2012-06-11: 4 mg via INTRAVENOUS

## 2012-06-11 MED ORDER — LACTATED RINGERS IV SOLN
INTRAVENOUS | Status: DC
  Administered 2012-06-11 – 2012-06-12 (×2): via INTRAVENOUS

## 2012-06-11 MED ORDER — LACTATED RINGERS IV SOLN
INTRAVENOUS | Status: DC
  Administered 2012-06-11 (×4): via INTRAVENOUS

## 2012-06-11 MED ORDER — DIPHENHYDRAMINE HCL 12.5 MG/5ML PO ELIX
12.5000 mg | ORAL_SOLUTION | Freq: Four times a day (QID) | ORAL | Status: DC | PRN
  Administered 2012-06-12: 12.5 mg via ORAL
  Filled 2012-06-11 (×2): qty 5

## 2012-06-11 MED ORDER — METOCLOPRAMIDE HCL 5 MG/ML IJ SOLN: 10.0000 mg | Freq: Once | INTRAMUSCULAR | Status: DC | PRN

## 2012-06-11 MED ORDER — DIPHENHYDRAMINE HCL 50 MG/ML IJ SOLN: 12.5000 mg | Freq: Four times a day (QID) | INTRAMUSCULAR | Status: DC | PRN

## 2012-06-11 MED ORDER — SCOPOLAMINE 1 MG/3DAYS TD PT72: 1.0000 | MEDICATED_PATCH | TRANSDERMAL | Status: DC

## 2012-06-11 MED ORDER — 0.9 % SODIUM CHLORIDE (POUR BTL) OPTIME
TOPICAL | Status: DC | PRN
  Administered 2012-06-11 (×2): 1000 mL

## 2012-06-11 MED ORDER — IBUPROFEN 600 MG PO TABS
600.0000 mg | ORAL_TABLET | Freq: Four times a day (QID) | ORAL | Status: DC | PRN
  Administered 2012-06-12 – 2012-06-13 (×4): 600 mg via ORAL
  Filled 2012-06-11 (×4): qty 1

## 2012-06-11 MED ORDER — BUPIVACAINE LIPOSOME 1.3 % IJ SUSP
20.0000 mL | Freq: Once | INTRAMUSCULAR | Status: DC
  Filled 2012-06-11: qty 20

## 2012-06-11 MED ORDER — ZOLPIDEM TARTRATE 5 MG PO TABS: 5.0000 mg | ORAL_TABLET | Freq: Every evening | ORAL | Status: DC | PRN

## 2012-06-11 MED ORDER — MENTHOL 3 MG MT LOZG: 1.0000 | LOZENGE | OROMUCOSAL | Status: DC | PRN

## 2012-06-11 MED ORDER — FENTANYL CITRATE 0.05 MG/ML IJ SOLN
INTRAMUSCULAR | Status: DC | PRN
Start: 2012-06-11 — End: 2012-06-11
  Administered 2012-06-11 (×7): 50 ug via INTRAVENOUS

## 2012-06-11 MED ORDER — STERILE WATER FOR IRRIGATION IR SOLN
Status: DC | PRN
  Administered 2012-06-11: 1000 mL via INTRAVESICAL

## 2012-06-11 MED ORDER — NALOXONE HCL 0.4 MG/ML IJ SOLN: 0.4000 mg | INTRAMUSCULAR | Status: DC | PRN

## 2012-06-11 MED ORDER — ACETAMINOPHEN 10 MG/ML IV SOLN
INTRAVENOUS | Status: AC
  Administered 2012-06-11: 1000 mg via INTRAVENOUS
  Filled 2012-06-11: qty 100

## 2012-06-11 MED ORDER — LIDOCAINE HCL (CARDIAC) 20 MG/ML IV SOLN
INTRAVENOUS | Status: DC | PRN
  Administered 2012-06-11: 60 mg via INTRAVENOUS

## 2012-06-11 MED ORDER — SERTRALINE HCL 50 MG PO TABS
50.0000 mg | ORAL_TABLET | Freq: Every day | ORAL | Status: DC
  Administered 2012-06-12 – 2012-06-13 (×2): 50 mg via ORAL
  Filled 2012-06-11 (×4): qty 1

## 2012-06-11 MED ORDER — EPHEDRINE SULFATE 50 MG/ML IJ SOLN
INTRAMUSCULAR | Status: DC | PRN
  Administered 2012-06-11 (×2): 10 mg via INTRAVENOUS

## 2012-06-11 MED ORDER — FENTANYL CITRATE 0.05 MG/ML IJ SOLN
INTRAMUSCULAR | Status: AC
  Filled 2012-06-11: qty 10

## 2012-06-11 MED ORDER — LIDOCAINE HCL (CARDIAC) 20 MG/ML IV SOLN
INTRAVENOUS | Status: AC
  Filled 2012-06-11: qty 5

## 2012-06-11 MED ORDER — BUPIVACAINE HCL (PF) 0.5 % IJ SOLN
INTRAMUSCULAR | Status: AC
  Filled 2012-06-11: qty 30

## 2012-06-11 MED ORDER — ONDANSETRON HCL 4 MG PO TABS: 4.0000 mg | ORAL_TABLET | Freq: Four times a day (QID) | ORAL | Status: DC | PRN

## 2012-06-11 MED ORDER — GLYCOPYRROLATE 0.2 MG/ML IJ SOLN
INTRAMUSCULAR | Status: DC | PRN
  Administered 2012-06-11: 0.6 mg via INTRAVENOUS

## 2012-06-11 SURGICAL SUPPLY — 44 items
CABLE HIGH FREQUENCY MONO STRZ (ELECTRODE) IMPLANT
CATH ROBINSON RED A/P 16FR (CATHETERS) ×4 IMPLANT
CLOTH BEACON ORANGE TIMEOUT ST (SAFETY) ×4 IMPLANT
CONT PATH 16OZ SNAP LID 3702 (MISCELLANEOUS) ×4 IMPLANT
COVER TABLE BACK 60X90 (DRAPES) ×4 IMPLANT
DECANTER SPIKE VIAL GLASS SM (MISCELLANEOUS) ×8 IMPLANT
DERMABOND ADVANCED (GAUZE/BANDAGES/DRESSINGS) ×1
DERMABOND ADVANCED .7 DNX12 (GAUZE/BANDAGES/DRESSINGS) ×3 IMPLANT
DRSG OPSITE POSTOP 4X10 (GAUZE/BANDAGES/DRESSINGS) ×4 IMPLANT
ELECT LIGASURE LONG (ELECTRODE) ×4 IMPLANT
ELECT LIGASURE SHORT 9 REUSE (ELECTRODE) IMPLANT
ELECT REM PT RETURN 9FT ADLT (ELECTROSURGICAL) ×8
ELECTRODE REM PT RTRN 9FT ADLT (ELECTROSURGICAL) ×6 IMPLANT
FILTER SMOKE EVAC LAPAROSHD (FILTER) ×4 IMPLANT
GLOVE BIO SURGEON STRL SZ8 (GLOVE) ×12 IMPLANT
GLOVE BIOGEL PI IND STRL 6.5 (GLOVE) ×3 IMPLANT
GLOVE BIOGEL PI INDICATOR 6.5 (GLOVE) ×1
GOWN STRL REIN XL XLG (GOWN DISPOSABLE) ×20 IMPLANT
NEEDLE INSUFFLATION 120MM (ENDOMECHANICALS) ×8 IMPLANT
NEEDLE INSUFFLATION 150MM (ENDOMECHANICALS) ×4 IMPLANT
NS IRRIG 1000ML POUR BTL (IV SOLUTION) ×4 IMPLANT
PACK LAVH (CUSTOM PROCEDURE TRAY) ×4 IMPLANT
PROTECTOR NERVE ULNAR (MISCELLANEOUS) ×4 IMPLANT
SCISSORS LAP 5X45 EPIX DISP (ENDOMECHANICALS) IMPLANT
SEALER TISSUE G2 CVD JAW 45CM (ENDOMECHANICALS) ×4 IMPLANT
SET CYSTO W/LG BORE CLAMP LF (SET/KITS/TRAYS/PACK) ×4 IMPLANT
SET IRRIG TUBING LAPAROSCOPIC (IRRIGATION / IRRIGATOR) IMPLANT
SPONGE SURGIFOAM ABS GEL 12-7 (HEMOSTASIS) ×4 IMPLANT
STRIP CLOSURE SKIN 1/4X3 (GAUZE/BANDAGES/DRESSINGS) IMPLANT
SUT MNCRL 0 MO-4 VIOLET 18 CR (SUTURE) ×3 IMPLANT
SUT MNCRL 0 VIOLET 6X18 (SUTURE) ×3 IMPLANT
SUT MNCRL AB 0 CT1 27 (SUTURE) IMPLANT
SUT MONOCRYL 0 6X18 (SUTURE) ×1
SUT MONOCRYL 0 MO 4 18  CR/8 (SUTURE) ×1
SUT VIC AB 4-0 PS2 27 (SUTURE) ×4 IMPLANT
SUT VICRYL 0 UR6 27IN ABS (SUTURE) IMPLANT
SYR 30ML LL (SYRINGE) ×4 IMPLANT
SYR 5ML LL (SYRINGE) ×4 IMPLANT
TOWEL OR 17X24 6PK STRL BLUE (TOWEL DISPOSABLE) ×8 IMPLANT
TRAY FOLEY CATH 14FR (SET/KITS/TRAYS/PACK) ×4 IMPLANT
TROCAR XCEL NON-BLD 11X100MML (ENDOMECHANICALS) ×4 IMPLANT
TROCAR XCEL NON-BLD 5MMX100MML (ENDOMECHANICALS) ×8 IMPLANT
WARMER LAPAROSCOPE (MISCELLANEOUS) ×4 IMPLANT
WATER STERILE IRR 1000ML POUR (IV SOLUTION) IMPLANT

## 2012-06-11 NOTE — Progress Notes (Signed)
Reviewed with patient procedure No changes to H&P per patient history  

## 2012-06-11 NOTE — Progress Notes (Signed)
Good pain relief, ambulating well Hungry  Blood pressure 100/63, pulse 95, temperature 98.5 F (36.9 C), temperature source Oral, resp. rate 18, height 5\' 8"  (1.727 m), weight 260 lb (117.935 kg), last menstrual period 05/24/2012, SpO2 95.00%.  Lungs CTA Cor RRR Abd soft, BS+, incisions OK Ext PAS on  A: Stable  P: Per Orders

## 2012-06-11 NOTE — Op Note (Signed)
NAME:  Felicia Chen, SPIKER NO.:  [id number]  MEDICAL RECORD NO.:  [id number]  LOCATION:  [facility]                          FACILITY:  [facility]  PHYSICIAN:  Guy Sandifer. Henderson Cloud, M.D. DATE OF BIRTH:  [birth year]  DATE OF PROCEDURE:  06/11/2012 DATE OF DISCHARGE:                              OPERATIVE REPORT   PREOPERATIVE DIAGNOSIS:  Uterine leiomyomata.  POSTOPERATIVE DIAGNOSES: 1. Uterine leiomyomata. 2. Endometriosis. 3. Dense pelvic adhesions.  PROCEDURE: 1. Attempted laparoscopic vaginal hysterectomy. 2. Total abdominal hysterectomy with bilateral salpingo-oophorectomy. 3. Extensive lysis of adhesions. 4. Cystoscopy.  SURGEON:  Guy Sandifer. Henderson Cloud, M.D.  ASSISTANT:  Duke Salvia. Marcelle Overlie, M.D.  ANESTHESIA:  General endotracheal intubation.  ESTIMATED BLOOD LOSS:  800 mL.  SPECIMENS:  Uterus, bilateral tubes, and ovaries to Pathology.  INDICATIONS AND CONSENT:  This patient is a 47 year old multiparous female with enlarging uterine leiomyomata.  Details are dictated in the history and physical.  Laparoscopically-assisted vaginal hysterectomy with bilateral salpingo-oophorectomy as discussed preoperatively. Potential risks and complications were reviewed preoperatively including, but not limited to, infection, organ damage, bleeding requiring transfusion of blood products with HIV and hepatitis acquisition, DVT, PE, pneumonia, fistula formation, pelvic pain, pain with intercourse, abdominal pain, laparotomy.  All questions were answered and consent is signed on the chart.  FINDINGS:  Upper abdomen is grossly normal.  Uterus is about 14 weeks in size, smooth in contour.  Right ovary has a 2 cm smooth translucent cyst and the right tube is normal status post ligation.  The left ovary and tube are adherent to the lower uterine segment, and the insertion of the uterosacral ligament into the uterus.  Anterior cul-de-sac is somewhat scarred with the bladder upon the uterus  secondary to C-section. Posterior cul-de-sac was clear.  PROCEDURE:  The patient was taken to the operating room, where she was identified, placed in dorsal supine position.  General anesthesia was induced via endotracheal intubation.  She was then placed in a dorsal lithotomy position.  Time-out was undertaken.  She was prepped abdominally, bladder straight catheterized.  Hulka was placed.  The uterus was manipulated and she was draped in a sterile fashion.  The infraumbilical and suprapubic areas were injected midline with approximately 6 mL of 0.25% plain Marcaine.  Small infraumbilical incision was made and a disposable Veress needle was placed on the 1st attempt without difficulty.  Good syringe and drop test were noted.  2 L of gas were then insufflated under low pressure with good tympany in the right upper quadrant.  Veress needle was removed.  A 10/11 XL bladeless disposable trocar sleeve was then inserted using direct visualization with the diagnostic laparoscope.  After insertion, the operative laparoscope was used.  A small suprapubic incision was made and later a right lower quadrant incision was made after careful transillumination and injection with local.  5 mm disposable XL trocar sleeves were placed and both under direct visualization without difficulty.  After noting the above, the EnSeal bipolar cautery cutting instrument was used to free the left ovary and tube.  Then coming across the left infundibulopelvic ligament, dissection was carried down to the round ligament, which was also taken down.  The adhesions to the left lower  uterine segment in the pelvic sidewall were quite thick.  The left portion of the vesicouterine peritoneum was taken down with the EnSeal. The right infundibulopelvic was taken down coming across the meso across the round ligament.  The attachments were quite thick as well on the lower uterine segment and the pelvic sidewall.  Suprapubic  trocar sleeves were removed.  The instruments were removed, and attention was turned to the vagina.  The cervix was circumscribed with unipolar cautery.  Mucosa was advanced sharply and bluntly.  There was very little descent.  Using the long LigaSure bipolar cautery instrument, progressive bites were taken and eventually the posterior cul-de-sac was entered without difficulty.  Further dissection was carried out with progressive small bites bilaterally.  Further sharp and blunt was carried out anteriorly.  Eventually it was noted that the endocervical canal had actually been entered.  At this point, the dissection was carried all the way up to the lower uterine segment, and there was no descent in a quite long vagina.  At this point, decision was made to perform abdominal hysterectomy.  Foley catheter was placed and attention was returned to the abdomen.  Pfannenstiel incision was made incorporating the two 5 mm sites.  Dissection was carried out in layers. The fascia was scarred down somewhat.  The peritoneum was successfully entered without difficulty.  This was extended superiorly and inferiorly.  Andrey Spearman retractor was placed.  Bowel was packed away and the upper blade and the bladder blade replaced.  A Kelly clamp was used to grasp the proximal ligaments bilaterally.  The bladder was further completely taken down and advanced sharply and bluntly. Straight clamps were used with Monocryl suture to take down the cardinal ligaments bilaterally.  This reaches the point where the posterior colpotomy had been done.  This was used to initiate the incisions in the upper vagina and to basically circumscribe the cervix maintaining maximum vaginal length.  The specimen was delivered intact.  Multiple bleeders were encountered, which were ligated and cauterized.  The vagina was then closed in a running, locking fashion with 0-Monocryl suture.  This achieved good hemostasis.  Irrigation  was carried out. Gelfoam was placed in the posterior cul-de-sac to help assure hemostasis.  Retractors and packs were removed.  The peritoneum was closed in running fashion with a 0-Monocryl suture.  The fascia was then closed in a running fashion with a 0-looped PDS suture.  Exparel anesthetic 20 mL diluted in 50 mL of saline was then injected subfascially and subcutaneously.  The adipose layer was reapproximated with interrupted plain suture and the skin was closed with clips.  The umbilical incision was closed with interrupted 4-0 Vicryl.  Dermabond was placed on this and dressings were placed on the Pfannenstiel incision.  Cystoscopy was then carried out.  Foley catheter was removed. The patient had already being given indigo carmine.  360-degree inspection reveals an intact bladder with no foreign bodies.  No evidence of perforation.  A good puff of indigo carmine was noted from the ureters bilaterally.  Cystoscope was removed.  Foley catheter was replaced.  All counts were correct.  The patient was awake and taken to recovery room in stable condition.    Guy Sandifer Henderson Cloud, M.D.    JET/MEDQ  D:  06/11/2012  T:  06/11/2012  Job:  [id number]

## 2012-06-11 NOTE — Anesthesia Postprocedure Evaluation (Signed)
  Anesthesia Post-op Note  Patient: Felicia Chen  Procedure(s) Performed: Procedure(s) with comments: LAPAROSCOPIC ASSISTED VAGINAL HYSTERECTOMY atttempted (N/A) - Attempted Laparoscopic assisted vaginal hysterectomy. SALPINGO OOPHORECTOMY (Bilateral) LAPAROSCOPIC LYSIS OF ADHESIONS (N/A) CYSTOSCOPY (N/A) HYSTERECTOMY ABDOMINAL (N/A)  Patient Location: [facility] Unit  Anesthesia Type:General  Level of Consciousness: awake, alert , oriented and patient cooperative  Airway and Oxygen Therapy: Patient Spontanous Breathing and Patient connected to nasal cannula oxygen  Post-op Pain: mild  Post-op Assessment: Patient's Cardiovascular Status Stable, Respiratory Function Stable, Patent Airway, No signs of Nausea or vomiting, Adequate PO intake and Pain level controlled  Post-op Vital Signs: Reviewed and stable  Complications: No apparent anesthesia complications

## 2012-06-11 NOTE — Anesthesia Postprocedure Evaluation (Signed)
  Anesthesia Post Note  Patient: Felicia Chen  Procedure(s) Performed: Procedure(s) (LRB): LAPAROSCOPIC ASSISTED VAGINAL HYSTERECTOMY atttempted (N/A) SALPINGO OOPHORECTOMY (Bilateral) LAPAROSCOPIC LYSIS OF ADHESIONS (N/A) CYSTOSCOPY (N/A) HYSTERECTOMY ABDOMINAL (N/A)  Anesthesia type: GA  Patient location: PACU  Post pain: Pain level controlled  Post assessment: Post-op Vital signs reviewed  Last Vitals:  Filed Vitals:   06/11/12 1115  BP: 130/70  Pulse: 94  Temp:   Resp: 14    Post vital signs: Reviewed  Level of consciousness: sedated  Complications: No apparent anesthesia complications

## 2012-06-11 NOTE — Brief Op Note (Signed)
06/11/2012  10:31 AM  PATIENT:  Felicia Chen  47 y.o. female  PRE-OPERATIVE DIAGNOSIS:  fibroids  POST-OPERATIVE DIAGNOSIS:  Fibroids, Endometriosis, pelvic adhesions  PROCEDURE:  Procedure(s) with comments: LAPAROSCOPIC ASSISTED VAGINAL HYSTERECTOMY atttempted (N/A) - Attempted Laparoscopic assisted vaginal hysterectomy. SALPINGO OOPHORECTOMY (Bilateral) LAPAROSCOPIC LYSIS OF ADHESIONS (N/A) CYSTOSCOPY (N/A) HYSTERECTOMY ABDOMINAL (N/A)  SURGEON:  Surgeon(s) and Role:    * [name], MD - Primary    * Meriel Pica, MD - Assisting  PHYSICIAN ASSISTANT:   ASSISTANTS: none   ANESTHESIA:   general  EBL:  Total I/O In: 2000 [I.V.:2000] Out: 875 [Urine:75; Blood:800]  BLOOD ADMINISTERED:none  DRAINS: Urinary Catheter (Foley)   LOCAL MEDICATIONS USED:  MARCAINE    and OTHER Exparel  SPECIMEN:  Source of Specimen:  uterus,bilateral tubes and ovaries  DISPOSITION OF SPECIMEN:  PATHOLOGY  COUNTS:  YES  TOURNIQUET:  * No tourniquets in log *  DICTATION: .Other Dictation: Dictation Number [id number]  PLAN OF CARE: Admit to inpatient   PATIENT DISPOSITION:  PACU - hemodynamically stable.   Delay start of Pharmacological VTE agent (>24hrs) due to surgical blood loss or risk of bleeding: not applicable

## 2012-06-11 NOTE — Transfer of Care (Signed)
Immediate Anesthesia Transfer of Care Note  Patient: Felicia Chen  Procedure(s) Performed: Procedure(s) with comments: LAPAROSCOPIC ASSISTED VAGINAL HYSTERECTOMY atttempted (N/A) - Attempted Laparoscopic assisted vaginal hysterectomy. SALPINGO OOPHORECTOMY (Bilateral) LAPAROSCOPIC LYSIS OF ADHESIONS (N/A) CYSTOSCOPY (N/A) HYSTERECTOMY ABDOMINAL (N/A)  Patient Location: PACU  Anesthesia Type:General  Level of Consciousness: awake, alert  and oriented  Airway & Oxygen Therapy: Patient Spontanous Breathing and Patient connected to nasal cannula oxygen  Post-op Assessment: Report given to PACU RN and Post -op Vital signs reviewed and stable  Post vital signs: stable  Complications: No apparent anesthesia complications

## 2012-06-11 NOTE — Progress Notes (Signed)
UR completed 

## 2012-06-12 ENCOUNTER — Encounter (HOSPITAL_COMMUNITY): Payer: Self-pay | Admitting: Obstetrics and Gynecology

## 2012-06-12 LAB — CBC
HCT: 32.3 % — ABNORMAL LOW (ref 36.0–46.0)
Hemoglobin: 10.9 g/dL — ABNORMAL LOW (ref 12.0–15.0)
MCH: 30.3 pg (ref 26.0–34.0)
MCHC: 33.7 g/dL (ref 30.0–36.0)
MCV: 89.7 fL (ref 78.0–100.0)

## 2012-06-12 NOTE — Progress Notes (Signed)
POD #1 Passing flatus, tolerating regular diet, ambulating well, voiding, good pain relief, tired  Blood pressure 111/59, pulse 73, temperature 98.5 F (36.9 C), temperature source Oral, resp. rate 16, height 5\' 8"  (1.727 m), weight 260 lb (117.935 kg), last menstrual period 05/24/2012, SpO2 95.00%.  Abd soft, good BS Incision healing well  Results for orders placed during the hospital encounter of 06/11/12 (from the past 24 hour(s))  CBC     Status: Abnormal   Collection Time    06/12/12  5:30 AM      Result Value Range   WBC 13.6 (*) 4.0 - 10.5 K/uL   RBC 3.60 (*) 3.87 - 5.11 MIL/uL   Hemoglobin 10.9 (*) 12.0 - 15.0 g/dL   HCT 13.0 (*) 86.5 - 78.4 %   MCV 89.7  78.0 - 100.0 fL   MCH 30.3  26.0 - 34.0 pg   MCHC 33.7  30.0 - 36.0 g/dL   RDW 69.6  29.5 - 28.4 %   Platelets 295  150 - 400 K/uL    A: Satisfactory   P: D/C in am     D/w patient discharge instructions, meds

## 2012-06-13 MED ORDER — IBUPROFEN 600 MG PO TABS: 600.0000 mg | ORAL_TABLET | Freq: Four times a day (QID) | ORAL | Status: DC | PRN

## 2012-06-13 MED ORDER — HYDROMORPHONE HCL 2 MG PO TABS: 2.0000 mg | ORAL_TABLET | ORAL | Status: DC | PRN

## 2012-06-13 NOTE — Progress Notes (Signed)
Patient discharged home.  Patient and family verbalized understanding of discharge instructions.  Patient ambulated to car without difficulty. 

## 2012-06-13 NOTE — Discharge Summary (Signed)
Physician Discharge Summary  Patient ID: Felicia Chen MRN: [id number] DOB/AGE: Aug 03, 1965 47 y.o.  Admit date: 06/11/2012 Discharge date: 06/13/2012  Admission Diagnoses:Fibroids  Discharge Diagnoses: Fibroids, endometriosis  Active Problems:   * No active hospital problems. *   Discharged Condition: good  Hospital Course: admitted for LAVH/BSO. LAVH converted to TAH/BSO. Good recovery of bowel and bladder function. VSS and afebrile. Some mild itching with Percocet. Good pain control.  Consults: None  Significant Diagnostic Studies: labs:  Results for orders placed during the hospital encounter of 06/11/12 (from the past 48 hour(s))  CBC     Status: Abnormal   Collection Time    06/12/12  5:30 AM      Result Value Range   WBC 13.6 (*) 4.0 - 10.5 K/uL   RBC 3.60 (*) 3.87 - 5.11 MIL/uL   Hemoglobin 10.9 (*) 12.0 - 15.0 g/dL   HCT 40.9 (*) 81.1 - 91.4 %   MCV 89.7  78.0 - 100.0 fL   MCH 30.3  26.0 - 34.0 pg   MCHC 33.7  30.0 - 36.0 g/dL   RDW 78.2  95.6 - 21.3 %   Platelets 295  150 - 400 K/uL    Treatments: IV hydration, antibiotics: Ancef and surgery: TAH/BSO, LOA  Discharge Exam: Blood pressure 135/79, pulse 82, temperature 98.3 F (36.8 C), temperature source Oral, resp. rate 18, height 5\' 8"  (1.727 m), weight 260 lb (117.935 kg), last menstrual period 05/24/2012, SpO2 95.00%. General appearance: alert, cooperative and no distress GI: soft, non-tender; bowel sounds normal; no masses,  no organomegaly  Disposition:      Medication List    STOP taking these medications       Lysine HCl 500 MG Tabs     vitamin C 500 MG tablet  Commonly known as:  ASCORBIC ACID      TAKE these medications       HYDROmorphone 2 MG tablet  Commonly known as:  DILAUDID  Take 1 tablet (2 mg total) by mouth every 4 (four) hours as needed for pain.     ibuprofen 600 MG tablet  Commonly known as:  ADVIL,MOTRIN  Take 1 tablet (600 mg total) by mouth every 6 (six) hours as  needed (mild pain).     sertraline 50 MG tablet  Commonly known as:  ZOLOFT  Take 50 mg by mouth daily.           Follow-up Information   Call in 1 week to follow up.      Signed: Jarone Ostergaard Chen,Felicia Chen 06/13/2012, 1:30 PM

## 2012-06-13 NOTE — Progress Notes (Signed)
Feeling good, tolerating regular diet, passing flatus, voiding. Some itching with Percocet  Blood pressure 135/79, pulse 82, temperature 98.3 F (36.8 C), temperature source Oral, resp. rate 18, height 5\' 8"  (1.727 m), weight 260 lb (117.935 kg), last menstrual period 05/24/2012, SpO2 95.00%.  Abd soft, incision healing well  A: Satisfactory  P: D/C home     Instructions given     Dilaudid 2mg 

## 2016-10-31 ENCOUNTER — Encounter (INDEPENDENT_AMBULATORY_CARE_PROVIDER_SITE_OTHER): Payer: Self-pay

## 2018-04-03 DIAGNOSIS — E669 Obesity, unspecified: Secondary | ICD-10-CM | POA: Diagnosis not present

## 2018-04-03 DIAGNOSIS — Z23 Encounter for immunization: Secondary | ICD-10-CM | POA: Diagnosis not present

## 2018-04-03 DIAGNOSIS — E7800 Pure hypercholesterolemia, unspecified: Secondary | ICD-10-CM | POA: Diagnosis not present

## 2018-08-01 DIAGNOSIS — E7800 Pure hypercholesterolemia, unspecified: Secondary | ICD-10-CM | POA: Diagnosis not present

## 2019-02-11 ENCOUNTER — Other Ambulatory Visit: Payer: Self-pay

## 2019-02-11 DIAGNOSIS — Z20822 Contact with and (suspected) exposure to covid-19: Secondary | ICD-10-CM

## 2019-02-13 LAB — NOVEL CORONAVIRUS, NAA: SARS-CoV-2, NAA: NOT DETECTED

## 2020-12-01 ENCOUNTER — Inpatient Hospital Stay (HOSPITAL_COMMUNITY): Payer: 59

## 2020-12-01 ENCOUNTER — Emergency Department (HOSPITAL_COMMUNITY): Payer: 59

## 2020-12-01 ENCOUNTER — Other Ambulatory Visit: Payer: Self-pay

## 2020-12-01 ENCOUNTER — Inpatient Hospital Stay (HOSPITAL_COMMUNITY)
Admission: EM | Admit: 2020-12-01 | Discharge: 2020-12-07 | DRG: 064 | Disposition: A | Discharge status: Rehabilitation Facility | Payer: 59 | Attending: Family Medicine | Admitting: Family Medicine

## 2020-12-01 DIAGNOSIS — R1310 Dysphagia, unspecified: Secondary | ICD-10-CM | POA: Diagnosis present

## 2020-12-01 DIAGNOSIS — G935 Compression of brain: Secondary | ICD-10-CM | POA: Diagnosis present

## 2020-12-01 DIAGNOSIS — G9349 Other encephalopathy: Secondary | ICD-10-CM | POA: Diagnosis present

## 2020-12-01 DIAGNOSIS — G9341 Metabolic encephalopathy: Secondary | ICD-10-CM | POA: Diagnosis not present

## 2020-12-01 DIAGNOSIS — I6389 Other cerebral infarction: Secondary | ICD-10-CM

## 2020-12-01 DIAGNOSIS — J9601 Acute respiratory failure with hypoxia: Secondary | ICD-10-CM | POA: Diagnosis present

## 2020-12-01 DIAGNOSIS — D72829 Elevated white blood cell count, unspecified: Secondary | ICD-10-CM

## 2020-12-01 DIAGNOSIS — R4182 Altered mental status, unspecified: Secondary | ICD-10-CM | POA: Diagnosis not present

## 2020-12-01 DIAGNOSIS — I10 Essential (primary) hypertension: Secondary | ICD-10-CM | POA: Diagnosis present

## 2020-12-01 DIAGNOSIS — H5461 Unqualified visual loss, right eye, normal vision left eye: Secondary | ICD-10-CM | POA: Diagnosis present

## 2020-12-01 DIAGNOSIS — I611 Nontraumatic intracerebral hemorrhage in hemisphere, cortical: Secondary | ICD-10-CM | POA: Diagnosis not present

## 2020-12-01 DIAGNOSIS — R7401 Elevation of levels of liver transaminase levels: Secondary | ICD-10-CM | POA: Diagnosis not present

## 2020-12-01 DIAGNOSIS — I161 Hypertensive emergency: Secondary | ICD-10-CM | POA: Diagnosis present

## 2020-12-01 DIAGNOSIS — R29713 NIHSS score 13: Secondary | ICD-10-CM | POA: Diagnosis present

## 2020-12-01 DIAGNOSIS — I959 Hypotension, unspecified: Secondary | ICD-10-CM | POA: Diagnosis not present

## 2020-12-01 DIAGNOSIS — D72828 Other elevated white blood cell count: Secondary | ICD-10-CM | POA: Diagnosis not present

## 2020-12-01 DIAGNOSIS — I612 Nontraumatic intracerebral hemorrhage in hemisphere, unspecified: Secondary | ICD-10-CM | POA: Diagnosis not present

## 2020-12-01 DIAGNOSIS — F419 Anxiety disorder, unspecified: Secondary | ICD-10-CM | POA: Diagnosis present

## 2020-12-01 DIAGNOSIS — G4733 Obstructive sleep apnea (adult) (pediatric): Secondary | ICD-10-CM | POA: Diagnosis present

## 2020-12-01 DIAGNOSIS — R110 Nausea: Secondary | ICD-10-CM | POA: Diagnosis not present

## 2020-12-01 DIAGNOSIS — R4701 Aphasia: Secondary | ICD-10-CM | POA: Diagnosis present

## 2020-12-01 DIAGNOSIS — Z79899 Other long term (current) drug therapy: Secondary | ICD-10-CM

## 2020-12-01 DIAGNOSIS — I69320 Aphasia following cerebral infarction: Secondary | ICD-10-CM | POA: Diagnosis not present

## 2020-12-01 DIAGNOSIS — G903 Multi-system degeneration of the autonomic nervous system: Secondary | ICD-10-CM | POA: Diagnosis not present

## 2020-12-01 DIAGNOSIS — U071 COVID-19: Secondary | ICD-10-CM | POA: Diagnosis present

## 2020-12-01 DIAGNOSIS — H5340 Unspecified visual field defects: Secondary | ICD-10-CM | POA: Diagnosis present

## 2020-12-01 DIAGNOSIS — Z6839 Body mass index (BMI) 39.0-39.9, adult: Secondary | ICD-10-CM | POA: Diagnosis not present

## 2020-12-01 DIAGNOSIS — T380X5A Adverse effect of glucocorticoids and synthetic analogues, initial encounter: Secondary | ICD-10-CM | POA: Diagnosis not present

## 2020-12-01 DIAGNOSIS — S0010XA Contusion of unspecified eyelid and periocular area, initial encounter: Secondary | ICD-10-CM | POA: Diagnosis not present

## 2020-12-01 DIAGNOSIS — G936 Cerebral edema: Secondary | ICD-10-CM | POA: Diagnosis present

## 2020-12-01 DIAGNOSIS — I67848 Other cerebrovascular vasospasm and vasoconstriction: Secondary | ICD-10-CM | POA: Diagnosis present

## 2020-12-01 DIAGNOSIS — R414 Neurologic neglect syndrome: Secondary | ICD-10-CM | POA: Diagnosis present

## 2020-12-01 DIAGNOSIS — G8191 Hemiplegia, unspecified affecting right dominant side: Secondary | ICD-10-CM

## 2020-12-01 DIAGNOSIS — J1282 Pneumonia due to coronavirus disease 2019: Secondary | ICD-10-CM | POA: Diagnosis not present

## 2020-12-01 DIAGNOSIS — F32A Depression, unspecified: Secondary | ICD-10-CM | POA: Diagnosis present

## 2020-12-01 DIAGNOSIS — E782 Mixed hyperlipidemia: Secondary | ICD-10-CM | POA: Diagnosis not present

## 2020-12-01 DIAGNOSIS — W19XXXA Unspecified fall, initial encounter: Secondary | ICD-10-CM | POA: Diagnosis not present

## 2020-12-01 DIAGNOSIS — I619 Nontraumatic intracerebral hemorrhage, unspecified: Secondary | ICD-10-CM | POA: Diagnosis present

## 2020-12-01 DIAGNOSIS — I609 Nontraumatic subarachnoid hemorrhage, unspecified: Secondary | ICD-10-CM | POA: Diagnosis present

## 2020-12-01 DIAGNOSIS — I610 Nontraumatic intracerebral hemorrhage in hemisphere, subcortical: Secondary | ICD-10-CM | POA: Diagnosis not present

## 2020-12-01 DIAGNOSIS — Z823 Family history of stroke: Secondary | ICD-10-CM

## 2020-12-01 DIAGNOSIS — G8110 Spastic hemiplegia affecting unspecified side: Secondary | ICD-10-CM | POA: Diagnosis not present

## 2020-12-01 DIAGNOSIS — Z8679 Personal history of other diseases of the circulatory system: Secondary | ICD-10-CM | POA: Diagnosis not present

## 2020-12-01 DIAGNOSIS — E785 Hyperlipidemia, unspecified: Secondary | ICD-10-CM | POA: Diagnosis present

## 2020-12-01 LAB — LIPID PANEL
Cholesterol: 289 mg/dL — ABNORMAL HIGH (ref 0–200)
HDL: 40 mg/dL — ABNORMAL LOW (ref >40)
LDL Cholesterol: 212 mg/dL — ABNORMAL HIGH (ref 0–99)
Total CHOL/HDL Ratio: 7.2 RATIO
Triglycerides: 186 mg/dL — ABNORMAL HIGH (ref <150)
VLDL: 37 mg/dL (ref 0–40)

## 2020-12-01 LAB — URINALYSIS, ROUTINE W REFLEX MICROSCOPIC
Bilirubin Urine: NEGATIVE
Glucose, UA: NEGATIVE mg/dL
Ketones, ur: NEGATIVE mg/dL
Leukocytes,Ua: NEGATIVE
Nitrite: NEGATIVE
Protein, ur: NEGATIVE mg/dL
Specific Gravity, Urine: 1.015 (ref 1.005–1.030)
pH: 6.5 (ref 5.0–8.0)

## 2020-12-01 LAB — COMPREHENSIVE METABOLIC PANEL
ALT: 41 U/L (ref 0–44)
AST: 29 U/L (ref 15–41)
Albumin: 3.8 g/dL (ref 3.5–5.0)
Alkaline Phosphatase: 54 U/L (ref 38–126)
Anion gap: 16 — ABNORMAL HIGH (ref 5–15)
BUN: 14 mg/dL (ref 6–20)
CO2: 23 mmol/L (ref 22–32)
Calcium: 9.5 mg/dL (ref 8.9–10.3)
Chloride: 99 mmol/L (ref 98–111)
Creatinine, Ser: 0.71 mg/dL (ref 0.44–1.00)
GFR, Estimated: >60 mL/min (ref >60)
Glucose, Bld: 130 mg/dL — ABNORMAL HIGH (ref 70–99)
Potassium: 3.4 mmol/L — ABNORMAL LOW (ref 3.5–5.1)
Sodium: 138 mmol/L (ref 135–145)
Total Bilirubin: 1.5 mg/dL — ABNORMAL HIGH (ref 0.3–1.2)
Total Protein: 6.8 g/dL (ref 6.5–8.1)

## 2020-12-01 LAB — DIFFERENTIAL
Abs Immature Granulocytes: 0.02 10*3/uL (ref 0.00–0.07)
Basophils Absolute: 0 10*3/uL (ref 0.0–0.1)
Basophils Relative: 1 %
Eosinophils Absolute: 0.2 10*3/uL (ref 0.0–0.5)
Eosinophils Relative: 2 %
Immature Granulocytes: 0 %
Lymphocytes Relative: 30 %
Lymphs Abs: 2.2 10*3/uL (ref 0.7–4.0)
Monocytes Absolute: 0.6 10*3/uL (ref 0.1–1.0)
Monocytes Relative: 8 %
Neutro Abs: 4.2 10*3/uL (ref 1.7–7.7)
Neutrophils Relative %: 59 %

## 2020-12-01 LAB — CBC
HCT: 44.1 % (ref 36.0–46.0)
Hemoglobin: 15.2 g/dL — ABNORMAL HIGH (ref 12.0–15.0)
MCH: 30.9 pg (ref 26.0–34.0)
MCHC: 34.5 g/dL (ref 30.0–36.0)
MCV: 89.6 fL (ref 80.0–100.0)
Platelets: 285 10*3/uL (ref 150–400)
RBC: 4.92 MIL/uL (ref 3.87–5.11)
RDW: 12.2 % (ref 11.5–15.5)
WBC: 7.1 10*3/uL (ref 4.0–10.5)
nRBC: 0 % (ref 0.0–0.2)

## 2020-12-01 LAB — ECHOCARDIOGRAM LIMITED
Area-P 1/2: 5.34 cm2
Calc EF: 80.6 %
Height: 68 in
S' Lateral: 2.4 cm
Single Plane A2C EF: 82.9 %
Single Plane A4C EF: 78.9 %
Weight: 4158.76 oz

## 2020-12-01 LAB — HEMOGLOBIN A1C
Hgb A1c MFr Bld: 5.4 % (ref 4.8–5.6)
Mean Plasma Glucose: 108.28 mg/dL

## 2020-12-01 LAB — I-STAT CHEM 8, ED
BUN: 20 mg/dL (ref 6–20)
Calcium, Ion: 1.13 mmol/L — ABNORMAL LOW (ref 1.15–1.40)
Chloride: 103 mmol/L (ref 98–111)
Creatinine, Ser: 0.6 mg/dL (ref 0.44–1.00)
Glucose, Bld: 132 mg/dL — ABNORMAL HIGH (ref 70–99)
HCT: 45 % (ref 36.0–46.0)
Hemoglobin: 15.3 g/dL — ABNORMAL HIGH (ref 12.0–15.0)
Potassium: 3.7 mmol/L (ref 3.5–5.1)
Sodium: 140 mmol/L (ref 135–145)
TCO2: 27 mmol/L (ref 22–32)

## 2020-12-01 LAB — RAPID URINE DRUG SCREEN, HOSP PERFORMED
Amphetamines: NOT DETECTED
Barbiturates: NOT DETECTED
Benzodiazepines: NOT DETECTED
Cocaine: NOT DETECTED
Opiates: NOT DETECTED
Tetrahydrocannabinol: NOT DETECTED

## 2020-12-01 LAB — RESP PANEL BY RT-PCR (FLU A&B, COVID) ARPGX2
Influenza A by PCR: NEGATIVE
Influenza B by PCR: NEGATIVE
SARS Coronavirus 2 by RT PCR: POSITIVE — AB

## 2020-12-01 LAB — PROTIME-INR
INR: 1 (ref 0.8–1.2)
Prothrombin Time: 13 seconds (ref 11.4–15.2)

## 2020-12-01 LAB — URINALYSIS, MICROSCOPIC (REFLEX)
Bacteria, UA: NONE SEEN
Squamous Epithelial / HPF: NONE SEEN (ref 0–5)
WBC, UA: NONE SEEN WBC/hpf (ref 0–5)

## 2020-12-01 LAB — ETHANOL: Alcohol, Ethyl (B): <10 mg/dL (ref <10)

## 2020-12-01 LAB — I-STAT BETA HCG BLOOD, ED (MC, WL, AP ONLY): I-stat hCG, quantitative: <5 m[IU]/mL (ref <5)

## 2020-12-01 LAB — SODIUM
Sodium: 139 mmol/L (ref 135–145)
Sodium: 139 mmol/L (ref 135–145)

## 2020-12-01 LAB — APTT: aPTT: 26 seconds (ref 24–36)

## 2020-12-01 LAB — C-REACTIVE PROTEIN: CRP: 1.4 mg/dL — ABNORMAL HIGH (ref <1.0)

## 2020-12-01 LAB — MRSA NEXT GEN BY PCR, NASAL: MRSA by PCR Next Gen: NOT DETECTED

## 2020-12-01 LAB — HIV ANTIBODY (ROUTINE TESTING W REFLEX): HIV Screen 4th Generation wRfx: NONREACTIVE

## 2020-12-01 LAB — PREGNANCY, URINE: Preg Test, Ur: NEGATIVE

## 2020-12-01 LAB — CBG MONITORING, ED: Glucose-Capillary: 126 mg/dL — ABNORMAL HIGH (ref 70–99)

## 2020-12-01 MED ORDER — LABETALOL HCL 5 MG/ML IV SOLN
10.0000 mg | Freq: Once | INTRAVENOUS | Status: AC
  Administered 2020-12-01: 10 mg via INTRAVENOUS

## 2020-12-01 MED ORDER — BETHANECHOL CHLORIDE 10 MG PO TABS
10.0000 mg | ORAL_TABLET | Freq: Three times a day (TID) | ORAL | Status: AC
  Administered 2020-12-01 – 2020-12-03 (×8): 10 mg via ORAL
  Filled 2020-12-01 (×8): qty 1

## 2020-12-01 MED ORDER — HYDROXYZINE HCL 25 MG PO TABS
25.0000 mg | ORAL_TABLET | Freq: Four times a day (QID) | ORAL | Status: DC
  Administered 2020-12-01 – 2020-12-07 (×26): 25 mg via ORAL
  Filled 2020-12-01 (×26): qty 1

## 2020-12-01 MED ORDER — LORAZEPAM 2 MG/ML IJ SOLN
0.5000 mg | INTRAMUSCULAR | Status: DC | PRN
  Administered 2020-12-01 – 2020-12-03 (×5): 0.5 mg via INTRAVENOUS
  Filled 2020-12-01: qty 1

## 2020-12-01 MED ORDER — DEXAMETHASONE SODIUM PHOSPHATE 10 MG/ML IJ SOLN
6.0000 mg | Freq: Every day | INTRAMUSCULAR | Status: DC
  Administered 2020-12-01 – 2020-12-07 (×7): 6 mg via INTRAVENOUS
  Filled 2020-12-01 (×7): qty 1

## 2020-12-01 MED ORDER — ACETAMINOPHEN 160 MG/5ML PO SOLN: 650.0000 mg | ORAL | Status: DC | PRN

## 2020-12-01 MED ORDER — REMDESIVIR 100 MG IV SOLR
100.0000 mg | Freq: Every day | INTRAVENOUS | Status: AC
Start: 2020-12-02 — End: 2020-12-05
  Administered 2020-12-02 – 2020-12-05 (×4): 100 mg via INTRAVENOUS
  Filled 2020-12-01 (×6): qty 20

## 2020-12-01 MED ORDER — LORAZEPAM 2 MG/ML IJ SOLN
1.0000 mg | Freq: Once | INTRAMUSCULAR | Status: AC
  Administered 2020-12-01: 1 mg via INTRAVENOUS

## 2020-12-01 MED ORDER — IOHEXOL 350 MG/ML SOLN
75.0000 mL | Freq: Once | INTRAVENOUS | Status: AC | PRN
  Administered 2020-12-01: 75 mL via INTRAVENOUS

## 2020-12-01 MED ORDER — PANTOPRAZOLE SODIUM 40 MG IV SOLR
40.0000 mg | Freq: Every day | INTRAVENOUS | Status: DC
  Administered 2020-12-01 – 2020-12-03 (×3): 40 mg via INTRAVENOUS
  Filled 2020-12-01 (×3): qty 40

## 2020-12-01 MED ORDER — HYDRALAZINE HCL 20 MG/ML IJ SOLN
20.0000 mg | INTRAMUSCULAR | Status: DC | PRN
  Filled 2020-12-01: qty 1

## 2020-12-01 MED ORDER — CLEVIDIPINE BUTYRATE 0.5 MG/ML IV EMUL
0.0000 mg/h | INTRAVENOUS | Status: DC
  Administered 2020-12-01: 2 mg/h via INTRAVENOUS
  Administered 2020-12-01: 8 mg/h via INTRAVENOUS
  Administered 2020-12-01: 6 mg/h via INTRAVENOUS
  Administered 2020-12-02: 2 mg/h via INTRAVENOUS
  Administered 2020-12-02: 5 mg/h via INTRAVENOUS
  Filled 2020-12-01 (×3): qty 100

## 2020-12-01 MED ORDER — LABETALOL HCL 5 MG/ML IV SOLN
20.0000 mg | INTRAVENOUS | Status: DC | PRN
  Administered 2020-12-01 – 2020-12-02 (×2): 20 mg via INTRAVENOUS
  Filled 2020-12-01 (×3): qty 4

## 2020-12-01 MED ORDER — SODIUM CHLORIDE 0.9 % IV SOLN
200.0000 mg | Freq: Once | INTRAVENOUS | Status: AC
  Administered 2020-12-01: 200 mg via INTRAVENOUS
  Filled 2020-12-01: qty 40

## 2020-12-01 MED ORDER — SERTRALINE HCL 50 MG PO TABS
100.0000 mg | ORAL_TABLET | Freq: Every day | ORAL | Status: DC
  Administered 2020-12-01 – 2020-12-02 (×2): 100 mg via ORAL
  Filled 2020-12-01 (×2): qty 2

## 2020-12-01 MED ORDER — ACETAMINOPHEN 325 MG PO TABS
650.0000 mg | ORAL_TABLET | ORAL | Status: DC | PRN
  Administered 2020-12-04 – 2020-12-07 (×4): 650 mg via ORAL
  Filled 2020-12-01 (×4): qty 2

## 2020-12-01 MED ORDER — LORAZEPAM 2 MG/ML IJ SOLN
INTRAMUSCULAR | Status: AC
  Filled 2020-12-01: qty 1

## 2020-12-01 MED ORDER — CHLORHEXIDINE GLUCONATE CLOTH 2 % EX PADS
6.0000 | MEDICATED_PAD | Freq: Every day | CUTANEOUS | Status: DC
  Administered 2020-12-02 – 2020-12-07 (×6): 6 via TOPICAL

## 2020-12-01 MED ORDER — STROKE: EARLY STAGES OF RECOVERY BOOK
Freq: Once | Status: AC
  Filled 2020-12-01: qty 1

## 2020-12-01 MED ORDER — SODIUM CHLORIDE 3 % IV SOLN
INTRAVENOUS | Status: DC
  Filled 2020-12-01 (×4): qty 500
  Filled 2020-12-01: qty 1000
  Filled 2020-12-01: qty 500

## 2020-12-01 MED ORDER — SENNOSIDES-DOCUSATE SODIUM 8.6-50 MG PO TABS
1.0000 | ORAL_TABLET | Freq: Two times a day (BID) | ORAL | Status: DC
  Administered 2020-12-02 – 2020-12-07 (×8): 1 via ORAL
  Filled 2020-12-01 (×12): qty 1

## 2020-12-01 MED ORDER — SODIUM CHLORIDE 0.9 % IV SOLN: INTRAVENOUS | Status: DC

## 2020-12-01 MED ORDER — ACETAMINOPHEN 650 MG RE SUPP: 650.0000 mg | RECTAL | Status: DC | PRN

## 2020-12-01 NOTE — Code Documentation (Signed)
Responded to Code Stroke called at [id number] for R sided weakness, LSN- yesterday afternoon. Pt arrived at 0540, CBG-126, NIH-13, CT head-60cc L frontal parietal lobar hematoma with regional subarachnoid extension. BP-170/95. Pt given labetolol '10mg'$  x 2 while in CT and cleviprex gtt started for goal SBP<140. Plan to admit to ICU.

## 2020-12-01 NOTE — Consult Note (Signed)
NAME:  Felicia Chen, MRN:  [id number], DOB:  [birth year], LOS: 0 ADMISSION DATE:  12/01/2020, CONSULTATION DATE: 11/21/2020 REFERRING MD: Dr. Leonie Man, CHIEF COMPLAINT: Headache and shortness of breath  History of Present Illness:  55 year old female with hyperlipidemia and obesity was brought in the emergency department with increasing headache and right-sided neglect with weakness.  Stroke code was called, patient was noted to have left frontal parietal intraparenchymal hemorrhage with acute ischemic stroke of left temporal region.  Patient was diagnosed with COVID few days ago, now with increasing shortness of breath requiring oxygen, PCCM was consulted for help with management. Patient continued complain of headache and shortness of breath, denies fever, chills, nausea vomiting, dysuria, urgency, frequency or other complaints  Pertinent  Medical History   Past Medical History:  Diagnosis Date   Anxiety    Gilbert's syndrome      Significant Hospital Events: Including procedures, antibiotic start and stop dates in addition to other pertinent events   9/14 admitted under stroke service, PCCM was consulted  Interim History / Subjective:    Objective   Blood pressure 128/66, pulse (!) 57, temperature 98.7 F (37.1 C), temperature source Oral, resp. rate 13, height '5\' 8"'$  (1.727 m), weight 117.9 kg, SpO2 (!) 84 %.       No intake or output data in the 24 hours ending 12/01/20 1039 Filed Weights   12/01/20 0703  Weight: 117.9 kg    Examination:   Physical exam: General: Crtitically ill-appearing obese middle-aged Caucasian female, on nasal cannula oxygen HEENT: [place]/AT, eyes anicteric.  Severely dry mucous membranes Neuro: Alert, awake, forgetful.  Following simple commands on left side, severe neglect on right side with right upper extremity 1/4, right lower extremity 2/4 Chest: Coarse breath sounds, no wheezes or rhonchi Heart: Regular rate and rhythm, no murmurs or  gallops Abdomen: Soft, nontender, nondistended, bowel sounds present Skin: No rash  Resolved Hospital Problem list     Assessment & Plan:  Acute left frontoparietal intraparenchymal hemorrhage with associated subarachnoid hemorrhage and cerebral brain edema, ICH score of 1 Acute hypoxic respiratory failure due to COVID-19 pneumonia Morbid obesity Hyperlipidemia Hypertension  Continue neuro watch every hour Patient has obtained some neglect on the right side PT/OT and speech evaluation Maintain systolic blood pressure [id number] Continue clevidipine as needed MRI and MRA brain is pending to rule out cortical vein thrombosis leading to hemorrhagic and ischemic stroke together Continue 3% saline to help reduce cytotoxic cerebral edema Continue oxygen via nasal cannula Continue contact and airborne isolation Started on remdesivir and dexamethasone Nutritionist follow-up Continue atorvastatin   Best Practice (right click and "Production designer, theatre/television/film" daily)   Diet/type: NPO speech and swallow evaluation pending DVT prophylaxis: SCD GI prophylaxis: N/A Lines: N/A Foley:  N/A Code Status:  full code Last date of multidisciplinary goals of care discussion [Per primary team]  Labs   CBC: Recent Labs  Lab 12/01/20 0545 12/01/20 0552  WBC 7.1  --   NEUTROABS 4.2  --   HGB 15.2* 15.3*  HCT 44.1 45.0  MCV 89.6  --   PLT 285  --     Basic Metabolic Panel: Recent Labs  Lab 12/01/20 0545 12/01/20 0552  NA 138 140  K 3.4* 3.7  CL 99 103  CO2 23  --   GLUCOSE 130* 132*  BUN 14 20  CREATININE 0.71 0.60  CALCIUM 9.5  --    GFR: Estimated Creatinine Clearance: 106 mL/min (by C-G formula based on SCr  of 0.6 mg/dL). Recent Labs  Lab 12/01/20 0545  WBC 7.1    Liver Function Tests: Recent Labs  Lab 12/01/20 0545  AST 29  ALT 41  ALKPHOS 54  BILITOT 1.5*  PROT 6.8  ALBUMIN 3.8   No results for input(s): LIPASE, AMYLASE in the last 168 hours. No results  for input(s): AMMONIA in the last 168 hours.  ABG    Component Value Date/Time   TCO2 27 12/01/2020 0552     Coagulation Profile: Recent Labs  Lab 12/01/20 0545  INR 1.0    Cardiac Enzymes: No results for input(s): CKTOTAL, CKMB, CKMBINDEX, TROPONINI in the last 168 hours.  HbA1C: Hgb A1c MFr Bld  Date/Time Value Ref Range Status  12/01/2020 08:32 AM 5.4 4.8 - 5.6 % Final    Comment:    (NOTE) Pre diabetes:          5.7%-6.4%  Diabetes:              >6.4%  Glycemic control for   <7.0% adults with diabetes     CBG: Recent Labs  Lab 12/01/20 0545  GLUCAP 126*    Review of Systems:   12 point review of systems significant for complaint mentioned in HPI, rest is negative  Past Medical History:  She,  has a past medical history of Anxiety and Gilbert's syndrome.   Surgical History:   Past Surgical History:  Procedure Laterality Date   ABDOMINAL HYSTERECTOMY N/A 06/11/2012   Procedure: HYSTERECTOMY ABDOMINAL;  Surgeon: Allena Katz, MD;  Location: [facility];  Service: Gynecology;  Laterality: N/A;   CESAREAN SECTION     x 4   CYSTOSCOPY N/A 06/11/2012   Procedure: CYSTOSCOPY;  Surgeon: Allena Katz, MD;  Location: [facility];  Service: Gynecology;  Laterality: N/A;   HERNIA REPAIR     LAPAROSCOPIC ASSISTED VAGINAL HYSTERECTOMY N/A 06/11/2012   Procedure: LAPAROSCOPIC ASSISTED VAGINAL HYSTERECTOMY atttempted;  Surgeon: Allena Katz, MD;  Location: [facility];  Service: Gynecology;  Laterality: N/A;  Attempted Laparoscopic assisted vaginal hysterectomy.   LAPAROSCOPIC LYSIS OF ADHESIONS N/A 06/11/2012   Procedure: LAPAROSCOPIC LYSIS OF ADHESIONS;  Surgeon: Allena Katz, MD;  Location: [facility];  Service: Gynecology;  Laterality: N/A;   SALPINGOOPHORECTOMY Bilateral 06/11/2012   Procedure: SALPINGO OOPHORECTOMY;  Surgeon: Allena Katz, MD;  Location: [facility];  Service: Gynecology;  Laterality: Bilateral;     Social History:   reports current alcohol  use. She reports that she does not use drugs.   Family History:  Her family history is not on file.   Allergies No Known Allergies   Home Medications  Prior to Admission medications   Medication Sig Start Date End Date Taking? Authorizing Provider  hydrOXYzine (VISTARIL) 25 MG capsule Take 25 mg by mouth 4 (four) times daily. 11/03/20   [provider]  sertraline (ZOLOFT) 100 MG tablet Take 100 mg by mouth daily. 11/03/20   [provider]     Critical care time:     Total critical care time: 47 minutes  Performed by: [place] care time was exclusive of separately billable procedures and treating other patients.   Critical care was necessary to treat or prevent imminent or life-threatening deterioration.   Critical care was time spent personally by me on the following activities: development of treatment plan with patient and/or surrogate as well as nursing, discussions with consultants, evaluation of patient's response to treatment, examination  of patient, obtaining history from patient or surrogate, ordering and performing treatments and interventions, ordering and review of laboratory studies, ordering and review of radiographic studies, pulse oximetry and re-evaluation of patient's condition.   Jacky Kindle MD [facility] See Amion for pager If no response to pager, please call [phone] until 7pm After 7pm, Please call E-link [phone]

## 2020-12-01 NOTE — ED Triage Notes (Signed)
Pt brought in by EMS for a code stroke. Pt reports a headache with right side deficits.

## 2020-12-01 NOTE — Procedures (Signed)
Patient Name: Felicia Chen  MRN: [id number]  Epilepsy Attending: Lora Havens  Referring Physician/Provider: Parke Poisson Date: 12/01/2020 Duration: 30.25 mins  Patient history: 55yo F with left frontoparietal ICH. EEG to evaluate for seizure  Level of alertness: Awake, asleep  AEDs during EEG study: None  Technical aspects: This EEG study was done with scalp electrodes positioned according to the 10-20 International system of electrode placement. Electrical activity was acquired at a sampling rate of '500Hz'$  and reviewed with a high frequency filter of '70Hz'$  and a low frequency filter of '1Hz'$ . EEG data were recorded continuously and digitally stored.   Description: No clear posterior dominant rhythm was seen. Sleep was characterized by vertex waves, sleep spindles (12 to 14 Hz), maximal frontocentral region.  EEG showed continuous generalized and lateralized left hemisphere 5 to 6 Hz theta-delta slowing admixed with 15-'18Hz'$  generalized beta activity. Sharp transients were noted in left temporal region. Hyperventilation and photic stimulation were not performed.     ABNORMALITY -Continuous slow, generalized and lateralized left hemisphere  IMPRESSION: This study is suggestive of cortical dysfunction in left hemisphere likely secondary to hemorrhage.  Additionally there is moderate diffuse encephalopathy, nonspecific etiology. No seizures or definite epileptiform discharges were seen throughout the recording.  If suspicion for interictal activity remains a concern, a prolonged study can be considered.   Malaiya Paczkowski Barbra Sarks

## 2020-12-01 NOTE — Progress Notes (Signed)
EEG complete - results pending 

## 2020-12-01 NOTE — ED Provider Notes (Signed)
[facility] EMERGENCY DEPARTMENT Provider Note   CSN: [id number] Arrival date & time: 12/01/20  0540     History No chief complaint on file.   Felicia Chen is a 55 y.o. female.  HPI     This is a 55 year old female with a history of anxiety and Felicia Chen syndrome who presents as a code stroke.  Last seen normal by family at 2 a.m.  Patient reports that she woke up around 4 AM with an intense headache.  She noted at 4 AM that she was unable to move her right arm and leg.  She cannot really tell us when she last normally moves her right arm and leg.  She states she has had a headache since yesterday evening but it intensified.  Per EMS was hypertensive in route.  Normal CBG.  Recently diagnosed with COVID-19.  Level 5 caveat for acuity of condition  Past Medical History:  Diagnosis Date   Anxiety    Gilbert's syndrome     Patient Active Problem List   Diagnosis Date Noted   HYPERLIPIDEMIA 07/06/2009   OBESITY 07/06/2009    Past Surgical History:  Procedure Laterality Date   ABDOMINAL HYSTERECTOMY N/A 06/11/2012   Procedure: HYSTERECTOMY ABDOMINAL;  Surgeon: Allena Katz, MD;  Location: [facility];  Service: Gynecology;  Laterality: N/A;   CESAREAN SECTION     x 4   CYSTOSCOPY N/A 06/11/2012   Procedure: CYSTOSCOPY;  Surgeon: Allena Katz, MD;  Location: [facility];  Service: Gynecology;  Laterality: N/A;   HERNIA REPAIR     LAPAROSCOPIC ASSISTED VAGINAL HYSTERECTOMY N/A 06/11/2012   Procedure: LAPAROSCOPIC ASSISTED VAGINAL HYSTERECTOMY atttempted;  Surgeon: Allena Katz, MD;  Location: [facility];  Service: Gynecology;  Laterality: N/A;  Attempted Laparoscopic assisted vaginal hysterectomy.   LAPAROSCOPIC LYSIS OF ADHESIONS N/A 06/11/2012   Procedure: LAPAROSCOPIC LYSIS OF ADHESIONS;  Surgeon: Allena Katz, MD;  Location: [facility];  Service: Gynecology;  Laterality: N/A;   SALPINGOOPHORECTOMY Bilateral 06/11/2012   Procedure: SALPINGO  OOPHORECTOMY;  Surgeon: Allena Katz, MD;  Location: [facility];  Service: Gynecology;  Laterality: Bilateral;     OB History   No obstetric history on file.     No family history on file.  Social History   Tobacco Use   Smoking status: Never  Substance Use Topics   Alcohol use: Yes    Comment: rarely   Drug use: No    Home Medications Prior to Admission medications   Medication Sig Start Date End Date Taking? Authorizing Provider  HYDROmorphone (DILAUDID) 2 MG tablet Take 1 tablet (2 mg total) by mouth every 4 (four) hours as needed for pain. 06/13/12   Everlene Farrier, MD  ibuprofen (ADVIL,MOTRIN) 600 MG tablet Take 1 tablet (600 mg total) by mouth every 6 (six) hours as needed (mild pain). 06/13/12   Everlene Farrier, MD  sertraline (ZOLOFT) 50 MG tablet Take 50 mg by mouth daily.    [provider]    Allergies    Patient has no known allergies.  Review of Systems   Review of Systems  Unable to perform ROS: Acuity of condition   Physical Exam Updated Vital Signs There were no vitals taken for this visit.  Physical Exam Vitals and nursing note reviewed.  Constitutional:      Appearance: She is well-developed. She is obese. She is not ill-appearing.  HENT:     Head: Normocephalic and atraumatic.  Nose: Nose normal.     Mouth/Throat:     Mouth: Mucous membranes are moist.  Eyes:     Pupils: Pupils are equal, round, and reactive to light.  Cardiovascular:     Rate and Rhythm: Normal rate and regular rhythm.  Pulmonary:     Effort: Pulmonary effort is normal. No respiratory distress.     Breath sounds: No wheezing.  Abdominal:     Palpations: Abdomen is soft.     Tenderness: There is no abdominal tenderness.  Musculoskeletal:     Cervical back: Neck supple.     Right lower leg: No edema.     Left lower leg: No edema.  Skin:    General: Skin is warm and dry.  Neurological:     Mental Status: She is alert and oriented to person, place, and time.      Comments: Cranial nerves II through XII intact, flaccid paralysis noted right upper extremity, right-sided neglect  Psychiatric:        Mood and Affect: Mood normal.    ED Results / Procedures / Treatments   Labs (all labs ordered are listed, but only abnormal results are displayed) Labs Reviewed  CBC - Abnormal; Notable for the following components:      Result Value   Hemoglobin 15.2 (*)    All other components within normal limits  CBG MONITORING, ED - Abnormal; Notable for the following components:   Glucose-Capillary 126 (*)    All other components within normal limits  I-STAT CHEM 8, ED - Abnormal; Notable for the following components:   Glucose, Bld 132 (*)    Calcium, Ion 1.13 (*)    Hemoglobin 15.3 (*)    All other components within normal limits  RESP PANEL BY RT-PCR (FLU A&B, COVID) ARPGX2  PROTIME-INR  APTT  DIFFERENTIAL  ETHANOL  COMPREHENSIVE METABOLIC PANEL  RAPID URINE DRUG SCREEN, HOSP PERFORMED  URINALYSIS, ROUTINE W REFLEX MICROSCOPIC  I-STAT BETA HCG BLOOD, ED ([facility], [facility], [facility] ONLY)    EKG None  Radiology No results found.  Procedures .Critical Care Performed by: Merryl Hacker, MD Authorized by: Merryl Hacker, MD   Critical care provider statement:    Critical care time (minutes):  35   Critical care was time spent personally by me on the following activities:  Discussions with consultants, evaluation of patient's response to treatment, examination of patient, ordering and performing treatments and interventions, ordering and review of laboratory studies, ordering and review of radiographic studies, pulse oximetry, re-evaluation of patient's condition, obtaining history from patient or surrogate and review of old charts   Medications Ordered in ED Medications - No data to display  ED Course  I have reviewed the triage vital signs and the nursing notes.  Pertinent labs & imaging results that were available during my care of the patient  were reviewed by me and considered in my medical decision making (see chart for details).  Clinical Course as of 12/01/20 0614  Wed Dec 01, 2020  0614 Now at the bedside.  He was updated regarding CT results.  CT concerning for acute bleed.  Further imaging ordered by neurology in conjunction with radiology for further delineation.  Patient will need ICU care and monitoring. [CH]    Clinical Course User Index [CH] Grigor Lipschutz, Barbette Hair, MD   MDM Rules/Calculators/A&P                           Patient  presents as a code stroke.  Reports headache as well as some right-sided deficits.  Unclear exact last known normal although family reports normal around 59.  Patient was sent to the [facility] with neurology.  ABCs intact but she had evidence of right-sided weakness and neglect.  CT reviewed independently and confirmed with neurology.  Patient with hemorrhagic stroke.  Unclear origin.  Patient be taken for further imaging.  Will need blood pressure control and ICU monitoring.  Husband updated at the bedside.  Available labs reviewed and largely reassuring.  No significant metabolic derangement or other.  Final Clinical Impression(s) / ED Diagnoses Final diagnoses:  Hemorrhagic stroke [facility])    Rx / DC Orders ED Discharge Orders     None        Shawnta Schlegel, Barbette Hair, MD 12/01/20 [phone]

## 2020-12-01 NOTE — Progress Notes (Addendum)
STROKE TEAM PROGRESS NOTE   INTERVAL HISTORY Patient is pleasant and cooperative. COVID precautions in place.  No one is in the room at time of this exam.  Blood pressure adequately controlled on Cleviprex drip.  Patient continues to have dense right hemiplegia with some hemineglect and right-sided visual field loss. Vitals:   12/01/20 0730 12/01/20 0830 12/01/20 0845 12/01/20 0900  BP: 138/76 (!) 143/83 128/71 128/66  Pulse: 88 87 91 (!) 57  Resp: (!) '25 20 16 13  '$ Temp:    98.7 F (37.1 C)  TempSrc:    Oral  SpO2: 96% 92% 96% (!) 84%  Weight:      Height:       CBC:  Recent Labs  Lab 12/01/20 0545 12/01/20 0552  WBC 7.1  --   NEUTROABS 4.2  --   HGB 15.2* 15.3*  HCT 44.1 45.0  MCV 89.6  --   PLT 285  --    Basic Metabolic Panel:  Recent Labs  Lab 12/01/20 0545 12/01/20 0552  NA 138 140  K 3.4* 3.7  CL 99 103  CO2 23  --   GLUCOSE 130* 132*  BUN 14 20  CREATININE 0.71 0.60  CALCIUM 9.5  --     Lipid Panel:  Recent Labs  Lab 12/01/20 0832  CHOL 289*  TRIG 186*  HDL 40*  CHOLHDL 7.2  VLDL 37  LDLCALC 212*    HgbA1c:  Recent Labs  Lab 12/01/20 0832  HGBA1C 5.4   Urine Drug Screen:  Recent Labs  Lab 12/01/20 0819  LABOPIA NONE DETECTED  COCAINSCRNUR NONE DETECTED  LABBENZ NONE DETECTED  AMPHETMU NONE DETECTED  THCU NONE DETECTED  LABBARB NONE DETECTED    Alcohol Level  Recent Labs  Lab 12/01/20 0545  ETH <10    IMAGING past 24 hours DG CHEST PORT 1 VIEW  Result Date: 12/01/2020 CLINICAL DATA:  Altered mental status. EXAM: PORTABLE CHEST 1 VIEW COMPARISON:  None. FINDINGS: Low volume film. Cardiopericardial silhouette is at upper limits of normal for size. There is pulmonary vascular congestion without overt pulmonary edema. Subtle airspace opacity noted at the lung bases. No pleural effusion. Telemetry leads overlie the chest. IMPRESSION: 1. Low volume film with pulmonary vascular congestion. 2. Subtle airspace opacity at the lung bases.  Atelectasis versus pneumonia. Electronically Signed   By: Misty Stanley M.D.   On: 12/01/2020 09:46   EEG adult  Result Date: 12/01/2020 Lora Havens, MD     12/01/2020 10:53 AM Patient Name: BRIXLEY DIOGUARDI MRN: [id number] Epilepsy Attending: Lora Havens Referring Physician/Provider: Parke Poisson Date: 12/01/2020 Duration: 30.25 mins Patient history: 55yo F with left frontoparietal ICH. EEG to evaluate for seizure Level of alertness: Awake, asleep AEDs during EEG study: None Technical aspects: This EEG study was done with scalp electrodes positioned according to the 10-20 International system of electrode placement. Electrical activity was acquired at a sampling rate of '500Hz'$  and reviewed with a high frequency filter of '70Hz'$  and a low frequency filter of '1Hz'$ . EEG data were recorded continuously and digitally stored. Description: No clear posterior dominant rhythm was seen. Sleep was characterized by vertex waves, sleep spindles (12 to 14 Hz), maximal frontocentral region.  EEG showed continuous generalized and lateralized left hemisphere 5 to 6 Hz theta-delta slowing admixed with 15-'18Hz'$  generalized beta activity. Sharp transients were noted in left temporal region. Hyperventilation and photic stimulation were not performed.   ABNORMALITY -Continuous slow, generalized and lateralized left hemisphere  IMPRESSION: This study is suggestive of cortical dysfunction in left hemisphere likely secondary to hemorrhage.  Additionally there is moderate diffuse encephalopathy, nonspecific etiology. No seizures or definite epileptiform discharges were seen throughout the recording. If suspicion for interictal activity remains a concern, a prolonged study can be considered. Lora Havens   CT VENOGRAM HEAD  Result Date: 12/01/2020 CLINICAL DATA:  Cerebral hemorrhage. EXAM: CT VENOGRAM HEAD TECHNIQUE: Venographic phase images of the brain were obtained following the administration of intravenous  contrast. Multiplanar reformats and maximum intensity projections were generated. CONTRAST:  37m OMNIPAQUE IOHEXOL 350 MG/ML SOLN COMPARISON:  Preceding CTA and noncontrast head CT from the same day FINDINGS: The dural sinuses are diffusely patent with no thrombus or focal stenosis. Paired deep veins are unremarkable. As permitted by subarachnoid blood and local mass effect, no visible cortical vein thrombus. IMPRESSION: Negative for dural venous sinus thrombosis. Electronically Signed   By: JJorje GuildM.D.   On: 12/01/2020 07:47   CT HEAD CODE STROKE WO CONTRAST  Result Date: 12/01/2020 CLINICAL DATA:  Code stroke.  Right-sided weakness EXAM: CT HEAD WITHOUT CONTRAST TECHNIQUE: Contiguous axial images were obtained from the base of the skull through the vertex without intravenous contrast. COMPARISON:  None. FINDINGS: Brain: Acute, high-density hematoma at the left frontal parietal cortex and subjacent white matter which measures 6.1 x 5 x 3.8 cm (nearly 60 cc volume). Regional subarachnoid extension. Mass effect on adjacent structures without midline shift or entrapment. Low-density appearance at the left temporal lobe could be accentuated by streak artifact but is not completely explained by artifact. It is unclear if this would reflect cerebritis, infarction, or other. Vascular: No hyperdense vessel or unexpected calcification. The dural sinuses do not appear hyperdense. No hyperdense cortical veins noted. There is plan to evaluate the dural sinuses at vessel imaging follow-up. Skull: Normal. Negative for fracture or focal lesion. Sinuses/Orbits: No acute finding. Other: Critical Value/emergent results were called by telephone at the time of interpretation on 12/01/2020 at 6:02 am to Dr BCurly Sh[place] who verbally acknowledged these results. ASPECTS ([facility] Early CT Score) Not scored in this setting IMPRESSION: 1. 60 cc left frontal parietal lobar hematoma with regional subarachnoid extension.  2. Low-density appearance of the left temporal lobe of indeterminate cause, as above. Recommend MR characterization when able. Electronically Signed   By: JJorje GuildM.D.   On: 12/01/2020 06:11   CT ANGIO HEAD NECK W WO CM (CODE STROKE)  Result Date: 12/01/2020 CLINICAL DATA:  Cerebral hemorrhage suspected. History of headache with subsequent weakness EXAM: CT ANGIOGRAPHY HEAD AND NECK TECHNIQUE: Multidetector CT imaging of the head and neck was performed using the standard protocol during bolus administration of intravenous contrast. Multiplanar CT image reconstructions and MIPs were obtained to evaluate the vascular anatomy. Carotid stenosis measurements (when applicable) are obtained utilizing NASCET criteria, using the distal internal carotid diameter as the denominator. CONTRAST:  761mOMNIPAQUE IOHEXOL 350 MG/ML SOLN COMPARISON:  Head CT from earlier the same day FINDINGS: CTA NECK FINDINGS Aortic arch: Normal arch with 3 vessel branching Right carotid system: Mild mixed density plaque at the bifurcation without stenosis or ulceration. ICA tortuosity without beading. Left carotid system: Mixed density plaque at the bifurcation without stenosis or ulceration. Vertebral arteries: No proximal subclavian stenosis. Codominant vertebral arteries which are widely patent to the dura. Borderline beading of the V4 segments, fibromuscular dysplasia is possible. Skeleton: Negative Other neck: No acute finding Upper chest: Negative Review of the MIP images confirms the  above findings CTA HEAD FINDINGS Anterior circulation: Widely patent carotid siphons. Hypoplastic left A1 segment. Segmental narrowings of MCA and ACA branches. No major branch occlusion or proximal flow limiting stenosis. Negative for aneurysm or vascular malformation. No spot sign at the lobar hematoma on the left. Posterior circulation: Codominant vertebral arteries. Vertebral and basilar arteries are smoothly contoured and widely patent.  Segmental narrowing is possible at the PCA branches. Narrow right P1 segment primarily attributed to fetal type circulation. Venous sinuses: Negative, as reported on dedicated CTV Anatomic variants: As above Review of the MIP images confirms the above findings IMPRESSION: 1. No spot sign or discrete vascular lesion underlying the lobar hematoma. 2. 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 3. Mild atherosclerosis in the neck. 4. Tortuous cervical carotid arteries and possible mild cervical vertebral fibromuscular dysplasia. Electronically Signed   By: Jorje Guild M.D.   On: 12/01/2020 07:35    PHYSICAL EXAM Pleasant mildly obese middle-aged Caucasian lady not in distress. . Afebrile. Head is nontraumatic. Neck is supple without bruit.    Cardiac exam no murmur or gallop. Lungs are clear to auscultation. Distal pulses are well felt.  Mental Status: Patient is awake, alert, oriented to person, place, month, year, and situation.  Patient appears anxious and at times tearful. Patient is not able to give history at this time but she recognizes that she is confused.  Clear right sided neglect but after coaching patient was able to recognize her right arm as part of her ody.   Cranial Nerves: II: Visual Fields are notable for  right hemianopia.    Pupils are equal, round, and reactive to light.   III,IV, VI: EOM lLeft gaze preference but able to look to the left past midline. V: Facial sensation is symmetric to light touch  VII: Facial movement is symmetric.  VIII: hearing is intact to voice XII: Tongue midline Unable to assess remainder given mental status  Motor: Tone is low in the RUE. Bulk is normal. RUE 1/5, RLE triple flexion, LUE no pronator drift, LLE no drift  Sensory: Marked loss of sensation in the right arm and leg  Deep Tendon Reflexes: 3+ right biceps and patella, 2+ on the left  Plantars: Toes are mute on the right and  downgoing on the left  Cerebellar: FNF and HKS are intact bilaterally  ASSESSMENT/PLAN Ms. TRENECIA GAFFKE is a 55 y.o. female with history of   significant for hyperlipidemia, obesity (BMI 39.5), anxiety presenting with confusion as well as headache in the setting of recent COVID infection. CT head showed 60 cc left frontal parietal lobar hematoma with regional subarachnoid extension. 2. Low-density appearance of the left temporal lobe of indeterminate cause, as above.   Stroke:  left temporal lobe ischemic stroke secondary to  small vessel disease  Left frontal lobe cerebral bleed with subarachnoid extension  Etiology unclear but likely related to her acute COVID illness  CT head 1. 60 cc left frontal parietal lobar hematoma with regiona subarachnoid extension. 2. Low-density appearance of the left temporal lobe of indeterminate cause, as above. Recommend MR characterization when able.  CTA head & neck  . No spot sign or discrete vascular lesion underlying the lobar hematoma. 2. 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 3. Mild atherosclerosis in the neck. 4. Tortuous cervical carotid arteries and possible mild cervical vertebral fibromuscular dysplasia. MRI  brain pending MRV 2D Echo done and results pending  LDL 212 HgbA1c 5.4 VTE prophylaxis - scd     Diet   Diet NPO time specified   No antithrombotic prior to admission, now on No antithrombotic. Cerebral bleed  Therapy recommendations:  pending Disposition:     Hypertension Home meds:   none stable Permissive hypertension (OK if < 220/120) but gradually normalize in 5-7 days Long-term BP goal normotensive  Hyperlipidemia Home meds:   none  LDL 212, goal < 70 Add lipitor   High intensity statin   Continue statin at discharge    HgbA1c 5.4, goal < 7.0 CBGs Recent Labs    12/01/20 0545  GLUCAP 126*    SSI  Other Stroke Risk Factors  Obesity,  Body mass index is 39.52 kg/m., BMI >/= 30 associated with increased stroke risk, recommend weight loss, diet and exercise as appropriate   Other Active Problems    Hospital day # 0  I have personally obtained history,examined this patient, reviewed notes, independently viewed imaging studies, participated in medical decision making and plan of care.ROS completed by me personally and pertinent positives fully documented  I have made any additions or clarifications directly to the above note. Agree with note above.  Patient presented with right-sided weakness and neglect secondary to left parietotemporal hemorrhage as well as left anterior temporal suspect ischemic infarct.  Etiology unclear possibly representing sinus thrombosis related to acute COVID illness.  Continue close neurological monitoring and strict blood pressure control with systolic goal 0000000 for the first 24 hours and then below 160.  Start hypertonic saline at 75 cc an hour trabeculae with serum sodium goal 150-155.  Check MRI scan of the brain and MRV later today.  No family available at the bedside for discussion.  Plan to consult pulmonary critical care team to help manage her acute COVID illness.  Restart home medications for anxiety and depression.  Discussed with Dr. Tacy Learn critical care medicine and pharmacist This patient is critically ill and at significant risk of neurological worsening, death and care requires constant monitoring of vital signs, hemodynamics,respiratory and cardiac monitoring, extensive review of multiple databases, frequent neurological assessment, discussion with family, other specialists and medical decision making of high complexity.I have made any additions or clarifications directly to the above note.This critical care time does not reflect procedure time, or teaching time or supervisory time of PA/NP/Med Resident etc but could involve care discussion time.  I spent 35 minutes of neurocritical care time   in the care of  this patient.      Antony Contras, MD Medical Director [facility] Pager: [phone] 12/01/2020 1:40 PM   To contact Stroke Continuity provider, please refer to [email or web address] After hours, contact General Neurology

## 2020-12-01 NOTE — Progress Notes (Signed)
  Echocardiogram 2D Echocardiogram has been performed.  Felicia Chen 12/01/2020, 12:03 PM

## 2020-12-01 NOTE — H&P (Addendum)
Neurology H&P Reason for Consult: Code stroke  CC: headache and right sided weakness  History is obtained from: Patient, EMS and chart review as well as husband at bedside  HPI: Felicia Chen is a 55 y.o. female with a PMHx significant for hyperlipidemia, obesity (BMI 39.5), anxiety  Patient developed typical COVID symptoms over the past weekend (3 to 4 days prior to admission), such as cough, fever, chills.  Her daughters have recently had COVID and were helping her, but she was isolating from her husband.  He notes that she began to complain of a severe headache yesterday afternoon which is atypical for her.  This resolved but then recurred around 7 PM at which time he went to get her some Excedrin for the pain.  The patient reports she woke up at 4:30 AM with a headache and she is not having any weakness and she woke up, but she is having significant neglect and her history is not felt to be reliable.  She has been out of her cholesterol medications that she is due for her PCP appointment, and otherwise takes hydroxyzine and Zoloft 100 mg daily for anxiety.  LKW: 9/13 afternoon tPA given?: No, ICH Premorbid modified rankin scale:      0 - No symptoms. ICH Score:   Time performed: 6:15  GCS: 13-15 is 0 points Infratentorial: No.. If yes, 1 point -- 0 Volume: >30cc is 1 point  Age: 55 y.o.. >80 is 1 point -- 0 Intraventricular extension is 1 point -- 0  Score:1  A Score of 1 points has a 30 day mortality of 13%. Stroke. 2001 Apr;32(4):891-7.    ROS: Unable to obtain due to altered mental status, obtained from family as able  Past Medical History:  Diagnosis Date   Anxiety    Gilbert's syndrome    No family history on file. File reports history of aneurysm in her sister and stroke in her maternal   Social History:  reports current alcohol use. She reports that she does not use drugs. No history on file for tobacco use. Family denies any substance use including alcohol  use  Exam: Current vital signs: There were no vitals taken for this visit. Vital signs in last 24 hours:     Physical Exam  Constitutional: Appears well-developed and well-nourished. Obese Psych: Affect calm and cooperative Eyes: No scleral injection HENT: No oropharyngeal obstruction.  MSK: no joint deformities.  Cardiovascular: Normal rate and regular rhythm.  Respiratory: Effort normal, non-labored breathing GI: Soft.  No distension. There is no tenderness.  Skin: Warm dry and intact visible skin  Neuro: Mental Status: Patient is awake, alert, oriented to person, place, month, year, and situation. Patient is able to give some coherent history but confused on details of time. Clear right neglect  Cranial Nerves: II: Visual Fields are notable for intermittent right hemianopia. Pupils are equal, round, and reactive to light.   III,IV, VI: EOM left gaze preference but ALMOST buries to the right  V: Facial sensation is symmetric to light touch  VII: Facial movement is symmetric.  VIII: hearing is intact to voice XII: Tongue midline Unable to assess remainder given mental status Motor: Tone is low in the RUE. Bulk is normal. RUE 1/5, RLE triple flexion, LUE no pronator drift, LLE no drift  Sensory: Marked loss of sensation in the left arm and leg  Deep Tendon Reflexes: 3+ right biceps and patella, 2+ on the left  Plantars: Toes are mute on the right  and downgoing on the left  Cerebellar: FNF and HKS are intact bilaterally  NIHSS total 13 Score breakdown: One-point for gaze deviation to the left, one-point for right hemianopia, 3 points for right upper extremity weakness, 3 points for right lower leg weakness, 2 points for severe sensory loss in the right, one-point for aphasia (possibly simple confusion), 2 points for neglect of right side   I have reviewed labs in epic and the results pertinent to this consultation are:  Basic Metabolic Panel: Recent Labs  Lab  12/01/20 0545 12/01/20 0552  NA 138 140  K 3.4* 3.7  CL 99 103  CO2 23  --   GLUCOSE 130* 132*  BUN 14 20  CREATININE 0.71 0.60  CALCIUM 9.5  --     CBC: Recent Labs  Lab 12/01/20 0545 12/01/20 0552  WBC 7.1  --   NEUTROABS 4.2  --   HGB 15.2* 15.3*  HCT 44.1 45.0  MCV 89.6  --   PLT 285  --     Coagulation Studies: Recent Labs    12/01/20 0545  LABPROT 13.0  INR 1.0   I have reviewed the images obtained:  Head CT 1. 60 cc left frontal parietal lobar hematoma with regional subarachnoid extension. 2. Low-density appearance of the left temporal lobe of indeterminate cause, as above. Recommend MR characterization when able.  Impression: Intracerebral hemorrhage of unclear etiology in the setting of COVID-19 infection.  Hypertensive hemorrhage is a possibility although patient does not have a known history of hypertension, underlying mass cannot be excluded, venous thrombosis with secondary intraparenchymal hemorrhage seems less likely, underlying vascular malformation is possible, RCVS is a possibility given recurrent severe headaches and use of SSRI    Assessment:   Plan: Cortical ICH, nontraumatic, with associated subarachnoid hemorrhage and cerebral edema  Acuity: Acute Laterality: Left frontoparietal  -Admit to stroke team, ICU -ICH Score: 1 -ICH Volume: 60 cc -BP control goal SYS< 140 -PT/OT/ST  -neuromonitoring  CNS Cerebral edema Compression of brain -Hold hyperosmolar therapy for now, will start if examination declines so that there is room for therapeutic effect -Every 6 hours sodium with goal 140 - 145 -Routine EEG given high risk of seizures due to cortical location of ICH  Dysarthria Dysphagia following ICH  -NPO until cleared by speech -ST -Advance diet as tolerated -May need PEG  Hemiplegia and hemiparesis following nontraumatic intracerebral hemorrhage affecting right dominant side  -Continue PT/OT/ST  Anxiety -Hold home SSRI  at this time given possible RCVS and theoretical effect on platelet function -Hold hydroxyzine for now given it is sedating  RESP Currently protecting airway -Confirmed with patent family patient is a full CODE STATUS in case of decline  CV Hypertensive Emergency -Aggressive BP control, goal SBP < 140 -Labetalol and clozapine for now  Potential heart failure -TTE  GI/GU No active issues, continue to monitor -Gentle hydration 50 cc normal saline per hour  HEME Iron Deficiency Anemia Blood Loss Anemia Anemia in CKD -Monitor -transfuse for hgb < 7  Chart history of possible Gilbert's syndrome -Continue to monitor  ENDO No active issues, follow-up A1c  Fluid/Electrolyte Disorders No active issues  ID  COVID-19 infection Possible Aspiration PNA -CXR -NPO -Monitor -CCM consult   Nutrition E66.9 Obesity  E46 Protein-Calorie Malnutrition Mild Moderate Severe -diet consult  Prophylaxis DVT: SCDs GI: Pantoprazole Bowel: Senna  Dispo: Pending clinical stabilization  Diet: NPO until cleared by speech  Code Status: Full Code     Lesleigh Noe MD-PhD  [facility] [phone] Available 7 PM to 7 AM, outside of these hours please call Neurologist on call as listed on Amion.   Total critical care time: 60 minutes   Critical care time was exclusive of separately billable procedures and treating other patients.   Critical care was necessary to treat or prevent imminent or life-threatening deterioration.   Critical care was time spent personally by me on the following activities: development of treatment plan with patient and/or surrogate as well as nursing, discussions with consultants/primary team, evaluation of patient's response to treatment, examination of patient, obtaining history from patient or surrogate, ordering and performing treatments and interventions, ordering and review of laboratory studies, ordering and review of radiographic  studies, and re-evaluation of patient's condition as needed, as documented above.

## 2020-12-02 ENCOUNTER — Inpatient Hospital Stay (HOSPITAL_COMMUNITY): Payer: 59

## 2020-12-02 DIAGNOSIS — I611 Nontraumatic intracerebral hemorrhage in hemisphere, cortical: Secondary | ICD-10-CM

## 2020-12-02 LAB — SODIUM
Sodium: 144 mmol/L (ref 135–145)
Sodium: 146 mmol/L — ABNORMAL HIGH (ref 135–145)
Sodium: 145 mmol/L (ref 135–145)
Sodium: 145 mmol/L (ref 135–145)

## 2020-12-02 MED ORDER — ORAL CARE MOUTH RINSE
15.0000 mL | Freq: Two times a day (BID) | OROMUCOSAL | Status: DC
  Administered 2020-12-03 – 2020-12-07 (×10): 15 mL via OROMUCOSAL

## 2020-12-02 MED ORDER — HYDRALAZINE HCL 20 MG/ML IJ SOLN: 20.0000 mg | INTRAMUSCULAR | Status: DC | PRN

## 2020-12-02 MED ORDER — HYALURONIDASE HUMAN 150 UNIT/ML IJ SOLN
150.0000 [IU] | Freq: Once | INTRAMUSCULAR | Status: AC
  Administered 2020-12-02: 150 [IU] via SUBCUTANEOUS
  Filled 2020-12-02: qty 1

## 2020-12-02 MED ORDER — BUSPIRONE HCL 10 MG PO TABS
5.0000 mg | ORAL_TABLET | Freq: Two times a day (BID) | ORAL | Status: DC
  Administered 2020-12-03 – 2020-12-07 (×9): 5 mg via ORAL
  Filled 2020-12-02 (×9): qty 1

## 2020-12-02 MED ORDER — LABETALOL HCL 5 MG/ML IV SOLN
20.0000 mg | INTRAVENOUS | Status: DC | PRN
  Administered 2020-12-03: 20 mg via INTRAVENOUS
  Filled 2020-12-02 (×2): qty 4

## 2020-12-02 MED ORDER — HYALURONIDASE OVINE 200 UNIT/ML IJ SOLN
150.0000 [IU] | Freq: Once | INTRAMUSCULAR | Status: DC
  Filled 2020-12-02: qty 0.75

## 2020-12-02 NOTE — Progress Notes (Signed)
TCD completed. Refer to "CV Proc" under chart review to view preliminary results.  12/02/2020 5:34 PM Kelby Aline., MHA, RVT, RDCS, RDMS

## 2020-12-02 NOTE — Progress Notes (Addendum)
At around 2200 the patient's IV watch began to alarm alerting to check IV site where 3% hypertonic saline was running at 75 mL/hr. The IV site appeared flushed and swollen, I attempted to get blood return and none was noted. I stopped the infusion, aspirated the drug, placed cold compress at the site, and elevated the arm above the level of the heart. The provider on call was contacted - pharmacy has been consulted. Awaiting any possible orders.   Normajean Baxter, RN  Updated at 2330 - pharmacist ordered hyaluronidase ovine (Vitrase) subcutaneous as well as hot compress. This was given and hot compress was applied.   Normajean Baxter, RN

## 2020-12-02 NOTE — Evaluation (Signed)
Occupational Therapy Evaluation Patient Details Name: Felicia Chen MRN: [id number] DOB: 09-30-1965 Today's Date: 12/02/2020   History of Present Illness 55 yo female presents to [facility] on 9/14 with intense headache, R hemiplegia. CTH/MRI shows 60 cc left frontal parietal lobar hematoma with SAH extending over bilat cerebral hemispheres and cerebellum, L temporal lobe ischemic CVA. Pt also with covid-19. PMH includes anxiety, Gilbert's syndrome, obesity, HLD.   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 Pt presents with a significant functional decline, requiring Max A +2 with mobility and Max A with ADL tasks due to R spastic hemiplegia, R inattention, sensory motor processing and visual deficits in addition to deficits listed below. Pt is extremely motivated to improve and has a very supportive family. Recommend intensive therapy at CIR to maximize functional level of independence and facilitate safe DC home with family.  VSS on RA throughout session. Encourage nsg to position pt in chair position at least TID.     Recommendations for follow up therapy are one component of a multi-disciplinary discharge planning process, led by the attending physician.  Recommendations may be updated based on patient status, additional functional criteria and insurance authorization.   Follow Up Recommendations  CIR    Equipment Recommendations  3 in 1 bedside commode;Tub/shower bench;Wheelchair (measurements OT);Wheelchair cushion (measurements OT)    Recommendations for Other Services Rehab consult     Precautions / Restrictions Precautions Precautions: Fall Precaution Comments: R hemi, inattention. SBP parameters 120-140 Restrictions Weight Bearing Restrictions: No      Mobility Bed Mobility Overal bed mobility: Needs Assistance Bed Mobility: Rolling;Sidelying to Sit;Sit to Supine Rolling: Mod assist;+2 for safety/equipment Sidelying  to sit: Max assist;+2 for physical assistance   Sit to supine: Max assist;+2 for physical assistance;HOB elevated   General bed mobility comments: mod assist for roll to R for truncal translation, RLE translation. Max +2 for supine<>sit for trunk and LE management, especially RLE given inattention and tone. Boost assist needed for moving up in bed, but pt assisting with LLE and and LUE.    Transfers Overall transfer level: Needs assistance Equipment used: 2 person hand held assist Transfers: Sit to/from Stand Sit to Stand: Max assist;+2 physical assistance;From elevated surface. Push towards R         General transfer comment: Max +2 for power up, rise, managing RLE, and steadying upon standing. Standing tolerance x10 seconds.    Balance Overall balance assessment: Needs assistance Sitting-balance support: Single extremity supported;Feet supported Sitting balance-Leahy Scale: Poor Sitting balance - Comments: R and posterior leaning, especially with fatigue Postural control: Right lateral lean;Posterior lean Standing balance support: Bilateral upper extremity supported Standing balance-Leahy Scale: Zero Standing balance comment: max +2                           ADL either performed or assessed with clinical judgement   ADL Overall ADL's : Needs assistance/impaired Eating/Feeding: Minimal assistance   Grooming: Moderate assistance;Sitting   Upper Body Bathing: Moderate assistance;Sitting   Lower Body Bathing: Maximal assistance;Bed level   Upper Body Dressing : Maximal assistance;Sitting   Lower Body Dressing: Total assistance;Bed level       Toileting- Clothing Manipulation and Hygiene: Total assistance       Functional mobility during ADLs: Maximal assistance;+2 for physical assistance General ADL Comments: affected by hemiplegia in addition to apraxia     Vision Baseline Vision/History: 1 Wears glasses (contacts)  Patient Visual Report: No change  from baseline Vision Assessment?: Vision impaired- to be further tested in functional context Additional Comments: decreased visual attention; most likely R field cut     Perception Perception Perception Tested?: Yes Perception Deficits: Inattention/neglect;Spatial orientation Inattention/Neglect: Impaired- to be further tested in functional context   [place] tested?: Deficits Deficits: Ideomotor;Organization;Initiation Praxis-Other Comments: "I know what I need to do...why can't I make my body do it"    Pertinent Vitals/Pain Pain Assessment: Faces Faces Pain Scale: No hurt     Hand Dominance Right   Extremity/Trunk Assessment Upper Extremity Assessment Upper Extremity Assessment: RUE deficits/detail RUE Deficits / Details: flexor synergy pattern reflexive in nature; no active movement; spasticity; +clonus; non-functional RUE RUE Sensation: decreased light touch;decreased proprioception RUE Coordination: decreased fine motor;decreased gross motor   Lower Extremity Assessment Lower Extremity Assessment: Defer to PT evaluation RLE Deficits / Details: 0/5 observed throughout RLE; hypertonicity with extensor tone in supine, assumes flexor tone when sitting EOB/standing RLE Sensation: decreased light touch LLE Deficits / Details: incoordination as assessed via heel-to-shin, at least 3/5 hip and knee flex/ext.   Cervical / Trunk Assessment Cervical / Trunk Assessment: Other exceptions (R bias) Cervical / Trunk Exceptions: R lateral leaning functionally   Communication Communication Communication: Expressive difficulties (word finding deficits at times)   Cognition Arousal/Alertness: Awake/alert Behavior During Therapy: Restless Overall Cognitive Status: Impaired/Different from baseline Area of Impairment: Attention;Safety/judgement;Awareness;Problem solving;Following commands                 Orientation Level: Disoriented to;Time Current Attention Level:  Sustained   Following Commands: Follows one step commands with increased time (apparent apraxia) Safety/Judgement: Decreased awareness of safety;Decreased awareness of deficits Awareness: Emergent Problem Solving: Slow processing;Decreased initiation;Difficulty sequencing;Requires verbal cues;Requires tactile cues General Comments: Demonstrates insight into deficits, however asks if she will get back to normal; attention affecting postural control while sitting EOB   General Comments  VSS on RA    Exercises Exercises: Other exercises Other Exercises Other Exercises: encouraged husband to keep RUE/hadn elevated on pillows Other Exercises: began education on RUE PROM with husband   Shoulder Instructions      Home Living Family/patient expects to be discharged to:: Private residence Living Arrangements: Spouse/significant other Available Help at Discharge: Family Type of Home: House Home Access: Stairs to enter Technical brewer of Steps: 2 (from kitchen to [employer])   Home Layout: One level     Bathroom Shower/Tub: Gaffer;Tub/shower unit   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 How Accessible: Accessible via walker Home Equipment: Shower seat;Hand held shower head      Lives With: Family    Prior Functioning/Environment Level of Independence: Independent        Comments: Pt helps 33 yo mother at baseline, completely independent        OT Problem List: Decreased strength;Decreased range of motion;Decreased activity tolerance;Impaired balance (sitting and/or standing);Impaired vision/perception;Decreased coordination;Decreased cognition;Decreased safety awareness;Decreased knowledge of use of DME or AE;Impaired sensation;Impaired tone;Obesity;Impaired UE functional use;Pain;Increased edema      OT Treatment/Interventions: Self-care/ADL training;Therapeutic exercise;Neuromuscular education;DME and/or AE instruction;Splinting;Therapeutic  activities;Cognitive remediation/compensation;Visual/perceptual remediation/compensation;Patient/family education;Balance training    OT Goals(Current goals can be found in the care plan section) Acute Rehab OT Goals Patient Stated Goal: get better OT Goal Formulation: With patient/family Time For Goal Achievement: 12/16/20 Potential to Achieve Goals: Good  OT Frequency: Min 2X/week   Barriers to D/C:            Co-evaluation PT/OT/SLP Co-Evaluation/Treatment: Yes Reason  for Co-Treatment: For patient/therapist safety;Complexity of the patient's impairments (multi-system involvement) PT goals addressed during session: Mobility/safety with mobility;Balance OT goals addressed during session: ADL's and self-care      AM-PAC OT "6 Clicks" Daily Activity     Outcome Measure Help from another person eating meals?: A Lot Help from another person taking care of personal grooming?: A Lot Help from another person toileting, which includes using toliet, bedpan, or urinal?: Total Help from another person bathing (including washing, rinsing, drying)?: A Lot Help from another person to put on and taking off regular upper body clothing?: A Lot Help from another person to put on and taking off regular lower body clothing?: Total 6 Click Score: 10   End of Session Nurse Communication: Mobility status  Activity Tolerance: Patient tolerated treatment well Patient left: in bed;with call bell/phone within reach;with bed alarm set;with family/visitor present;Other (comment) (chair position)  OT Visit Diagnosis: Other abnormalities of gait and mobility (R26.89);Muscle weakness (generalized) (M62.81);Apraxia (R48.2);Other symptoms and signs involving cognitive function;Other symptoms and signs involving the nervous system (R29.898);Hemiplegia and hemiparesis;Pain Hemiplegia - Right/Left: Right Hemiplegia - dominant/non-dominant: Dominant Hemiplegia - caused by: Nontraumatic SAH;Other Nontraumatic  intracranial hemorrhage;Cerebral infarction Pain - part of body:  (general discomfort)                Time: [id number] OT Time Calculation (min): 49 min Charges:  OT General Charges $OT Visit: 1 Visit OT Evaluation $OT Eval Moderate Complexity: 1 Mod OT Treatments $Self Care/Home Management : 8-22 mins  Maurie Boettcher, OT/L   Acute OT Clinical Specialist [place] Pager [phone] Office [phone]   [facility] 12/02/2020, 4:18 PM

## 2020-12-02 NOTE — Progress Notes (Signed)
SBP goal < 160 per Dr. Leonie Man Orders modified.

## 2020-12-02 NOTE — Evaluation (Signed)
Speech Language Pathology Evaluation Patient Details Name: Felicia Chen MRN: [id number] DOB: 25-Sep-1965 Today's Date: 12/02/2020 Time: 1511-1540 SLP Time Calculation (min) (ACUTE ONLY): 29 min  Problem List:  Patient Active Problem List   Diagnosis Date Noted   ICH (intracerebral hemorrhage) ([place]) 12/01/2020   HYPERLIPIDEMIA 07/06/2009   OBESITY 07/06/2009   Past Medical History:  Past Medical History:  Diagnosis Date   Anxiety    Gilbert's syndrome    Past Surgical History:  Past Surgical History:  Procedure Laterality Date   ABDOMINAL HYSTERECTOMY N/A 06/11/2012   Procedure: HYSTERECTOMY ABDOMINAL;  Surgeon: Allena Katz, MD;  Location: [facility];  Service: Gynecology;  Laterality: N/A;   CESAREAN SECTION     x 4   CYSTOSCOPY N/A 06/11/2012   Procedure: CYSTOSCOPY;  Surgeon: Allena Katz, MD;  Location: [place];  Service: Gynecology;  Laterality: N/A;   HERNIA REPAIR     LAPAROSCOPIC ASSISTED VAGINAL HYSTERECTOMY N/A 06/11/2012   Procedure: LAPAROSCOPIC ASSISTED VAGINAL HYSTERECTOMY atttempted;  Surgeon: Allena Katz, MD;  Location: [facility];  Service: Gynecology;  Laterality: N/A;  Attempted Laparoscopic assisted vaginal hysterectomy.   LAPAROSCOPIC LYSIS OF ADHESIONS N/A 06/11/2012   Procedure: LAPAROSCOPIC LYSIS OF ADHESIONS;  Surgeon: Allena Katz, MD;  Location: [facility];  Service: Gynecology;  Laterality: N/A;   SALPINGOOPHORECTOMY Bilateral 06/11/2012   Procedure: SALPINGO OOPHORECTOMY;  Surgeon: Allena Katz, MD;  Location: [facility];  Service: Gynecology;  Laterality: Bilateral;   HPI:  55 yo female presents to [facility] on 9/14 with intense headache, R sided neglect with weakness. CTH/MRI shows 60 cc left frontal parietal lobar hematoma with SAH extending over bilat cerebral hemispheres and cerebellum, L temporal lobe ischemic CVA. Pt also with covid-19. PMH includes anxiety, Gilbert's syndrome, obesity, HLD.   Assessment / Plan /  Recommendation Clinical Impression  Pt presents with mild expressive and receptive aphasia with question of cognitive communication deficit post CVA. Pt engaged in complex conversation with clinician, exhibiting occasional semantic paraphasias without awareness or self correction. She is able to follow simple commands and answer simple y/n questions, but demonstrates decreased auditory comprehension with greater complexity. She benefits from clarification questions in order to increase mutual understanding in conversation. Immediate recall of word lists and prompted commands was mildly reduced. Pt reports being "out of it" as reason for performance this date. Recommend SLP services to f/u to continue to address language and cognitive functions during acute stay.    SLP Assessment  SLP Recommendation/Assessment: Patient needs continued Speech Lanaguage Pathology Services SLP Visit Diagnosis: Aphasia (R47.01);Cognitive communication deficit (R41.841)    Recommendations for follow up therapy are one component of a multi-disciplinary discharge planning process, led by the attending physician.  Recommendations may be updated based on patient status, additional functional criteria and insurance authorization.    Follow Up Recommendations  Inpatient Rehab    Frequency and Duration min 2x/week  2 weeks      SLP Evaluation Cognition  Overall Cognitive Status: Impaired/Different from baseline Arousal/Alertness: Awake/alert Orientation Level: Oriented to person;Oriented to place;Oriented to situation Attention: Sustained Sustained Attention: Appears intact Memory: Impaired Memory Impairment: Storage deficit;Retrieval deficit;Decreased recall of new information Immediate Memory Recall:  (4/5 immediate and delayed) Awareness: Impaired Awareness Impairment: Emergent impairment Executive Function: Self Monitoring;Self Correcting Self Monitoring: Impaired Self Monitoring Impairment: Verbal basic Self  Correcting: Impaired Self Correcting Impairment: Verbal basic       Comprehension  Auditory Comprehension Overall Auditory Comprehension: Impaired Yes/No  Questions: Impaired Complex Questions: 50-74% accurate Commands: Impaired Two Step Basic Commands: 50-74% accurate Complex Commands: 25-49% accurate Conversation: Complex EffectiveTechniques: Repetition;Extra processing time Visual Recognition/Discrimination Discrimination: Not tested Reading Comprehension Reading Status: Within funtional limits    Expression Expression Primary Mode of Expression: Verbal Verbal Expression Overall Verbal Expression: Impaired Initiation: No impairment Automatic Speech: Social Response Level of Generative/Spontaneous Verbalization: Conversation Repetition: No impairment Naming: Impairment Confrontation: Within functional limits Other Naming Comments: receptive naming 80% Verbal Errors: Semantic paraphasias;Not aware of errors Pragmatics: No impairment Interfering Components: Other (comment) (reported lethargy) Effective Techniques: Open ended questions Written Expression Dominant Hand: Right Written Expression: Not tested   Oral / Motor  Oral Motor/Sensory Function Overall Oral Motor/Sensory Function: Within functional limits Motor Speech Overall Motor Speech: Appears within functional limits for tasks assessed   [place], [place], [place] Office Number: [phone]  Acie Fredrickson 12/02/2020, 3:57 PM

## 2020-12-02 NOTE — Plan of Care (Signed)
Notified of infiltrated IV site for 3% saline  Pharmacy consult placed for recommendations on arm care  Lesleigh Noe MD-PhD [facility] [phone]  Available 7 PM to 7 AM, outside of these hours please call Neurologist on call as listed on Amion.

## 2020-12-02 NOTE — Progress Notes (Signed)
OT Cancellation Note  Patient Details Name: Felicia Chen MRN: [id number] DOB: Jul 23, 1965   Cancelled Treatment:    Reason Eval/Treat Not Completed: Active bedrest order  [place], OT/L   Acute OT Clinical Specialist Acute Rehabilitation Services Pager [phone] Office [phone]  12/02/2020, 8:14 AM

## 2020-12-02 NOTE — Progress Notes (Signed)
Inpatient Rehabilitation Admissions Coordinator   Inpatient rehab consult./prescreen received. Noted COVID + 12/01/20. Patients are eligible to be considered for admit to the [facility] when cleared from airborne precautions by acute MD or Infectious disease regardless of onset day.  Please call me with any questions.    Danne Baxter, RN, MSN Rehab Admissions Coordinator [phone] 12/02/2020 1:01 PM

## 2020-12-02 NOTE — Evaluation (Signed)
Physical Therapy Evaluation Patient Details Name: Felicia Chen MRN: [id number] DOB: 1965/08/14 55 yo female presents to [facility] on 9/14 with intense headache, R hemiplegia. CTH/MRI shows 60 cc left frontal parietal lobar hematoma with SAH extending over bilat cerebral hemispheres and cerebellum, L temporal lobe ischemic CVA. Pt also with covid-19. PMH includes anxiety, Gilbert's syndrome, obesity, HLD.  Clinical Impression   Pt presents with R inattention, R hemiplegia, impaired R sensation, apraxia, poor sitting and standing balance, R-sided hypertonicity, L sided ataxia, and max difficulty performing mobility tasks at this time. Pt to benefit from acute PT to address deficits. Pt requires max +2 to reach standing position from EOB today, with heavy R lateral leaning and R hypertonicity. At baseline, pt is completely independent and states multiple times during session "will I be back to normal? Did I do well?". PT recommending CIR to maximize functional recovery, has a supportive family (husband works from home, 3 daughters with with pt and husband).  PT to progress mobility as tolerated, and will continue to follow acutely.         Recommendations for follow up therapy are one component of a multi-disciplinary discharge planning process, led by the attending physician.  Recommendations may be updated based on patient status, additional functional criteria and insurance authorization.  Follow Up Recommendations CIR    Equipment Recommendations  3in1 (PT);Wheelchair (measurements PT);Wheelchair cushion (measurements PT)    Recommendations for Other Services Rehab consult     Precautions / Restrictions Precautions Precautions: Fall Precaution Comments: R hemi, inattention. SBP parameters 120-140 Restrictions Weight Bearing Restrictions: No      Mobility  Bed Mobility Overal bed mobility: Needs Assistance Bed Mobility:  Rolling;Sidelying to Sit;Sit to Supine Rolling: Mod assist;+2 for safety/equipment Sidelying to sit: Max assist;+2 for physical assistance   Sit to supine: Max assist;+2 for physical assistance;HOB elevated   General bed mobility comments: mod assist for roll to R for truncal translation, RLE translation. Max +2 for supine<>sit for trunk and LE management, especially RLE given inattention and tone. Boost assist needed for moving up in bed, but pt assisting with LLE and and LUE.    Transfers Overall transfer level: Needs assistance Equipment used: 2 person hand held assist Transfers: Sit to/from Stand Sit to Stand: Max assist;+2 physical assistance;From elevated surface         General transfer comment: Max +2 for power up, rise, managing RLE, and steadying upon standing. Standing tolerance x10 seconds.  Ambulation/Gait             General Gait Details: nt  Financial trader Rankin (Stroke Patients Only) Modified Rankin (Stroke Patients Only) Pre-Morbid Rankin Score: No symptoms Modified Rankin: Severe disability     Balance Overall balance assessment: Needs assistance Sitting-balance support: Single extremity supported;Feet supported Sitting balance-Leahy Scale: Poor Sitting balance - Comments: R and posterior leaning, especially with fatigue Postural control: Right lateral lean;Posterior lean Standing balance support: Bilateral upper extremity supported Standing balance-Leahy Scale: Zero Standing balance comment: max +2                             Pertinent Vitals/Pain Pain Assessment: No/denies pain    Home Living Family/patient expects to be discharged to:: Private residence Living Arrangements: Spouse/significant other Available Help at Discharge: Family Type of Home: House (information  based on pt's mother's house) Home Access: Stairs to enter   [employer] of Steps: 2 (from kitchen to  [employer]) Home Layout: One level Home Equipment: Shower seat;Hand held shower head      Prior Function Level of Independence: Independent         Comments: Pt helps 72 yo mother at baseline, completely independent     Hand Dominance   Dominant Hand: Right    Extremity/Trunk Assessment   Upper Extremity Assessment Upper Extremity Assessment: Defer to OT evaluation    Lower Extremity Assessment Lower Extremity Assessment: RLE deficits/detail;LLE deficits/detail RLE Deficits / Details: 0/5 observed throughout RLE; hypertonicity with extensor tone in supine, assumes flexor tone when sitting EOB/standing RLE Sensation: decreased light touch LLE Deficits / Details: incoordination as assessed via heel-to-shin, at least 3/5 hip and knee flex/ext.    Cervical / Trunk Assessment Cervical / Trunk Assessment: Other exceptions Cervical / Trunk Exceptions: R lateral leaning functionally  Communication   Communication: No difficulties  Cognition Arousal/Alertness: Awake/alert Behavior During Therapy: Restless Overall Cognitive Status: Impaired/Different from baseline Area of Impairment: Orientation;Attention;Following commands;Safety/judgement;Problem solving;Awareness                 Orientation Level: Disoriented to;Time Current Attention Level: Sustained   Following Commands: Follows one step commands with increased time Safety/Judgement: Decreased awareness of safety Awareness: Emergent Problem Solving: Decreased initiation;Difficulty sequencing;Requires verbal cues;Requires tactile cues General Comments: Pt states year is 2021, otherwise A&O. Pt aware of CVA and that things feel "weird", but does not perceive impairements functionally (i.e. heavy R lateral leaning, R inattention). Pt demonstrating ideomotor apraxia during exam (cannot execute finger to nose initially stating "why can't I do that?"), requires max multimodal cuing for mobility.      General Comments General  comments (skin integrity, edema, etc.): SpO2 90-95% on RA, SBP 129 sitting EOB, other vss    Exercises     Assessment/Plan    PT Assessment Patient needs continued PT services  PT Problem List Decreased strength;Decreased mobility;Decreased safety awareness;Impaired tone;Decreased range of motion;Decre  History of Present Illness  55 yo female presents to [facility] on 9/14 with intense headache, R hemiplegia. CTH/MRI shows 60 cc left frontal parietal lobar hematoma with SAH extending over bilat cerebral hemispheres and cerebellum, L temporal lobe ischemic CVA. Pt also with covid-19. PMH includes anxiety, Gilbert's syndrome, obesity, HLD.  Clinical Impression   Pt presents with R inattention, R hemiplegia, impaired R sensation, apraxia, poor sitting and standing balance, R-sided hypertonicity, L sided ataxia, and max difficulty performing mobility tasks at this time. Pt to benefit from acute PT to address deficits. Pt requires max +2 to reach standing position from EOB today, with heavy R lateral leaning and R hypertonicity. At baseline, pt is completely independent and states multiple times during session "will I be back to normal? Did I do well?". PT recommending CIR to maximize functional recovery, has a supportive family (husband works from home, 3 daughters with with pt and husband).  PT to progress mobility as tolerated, and will continue to follow acutely.         Recommendations for follow up therapy are one component of a multi-disciplinary discharge planning process, led by the attending physician.  Recommendations may be updated based on patient status, additional functional criteria and insurance authorization.  Follow Up Recommendations CIR    Equipment Recommendations  3in1 (PT);Wheelchair (measurements PT);Wheelchair cushion (measurements PT)    Recommendations for Other Services Rehab consult     Precautions / Restrictions Precautions Precautions: Fall Precaution Comments: R hemi, inattention. SBP parameters 120-140 Restrictions Weight Bearing Restrictions: No      Mobility  Bed Mobility Overal bed mobility: Needs Assistance Bed Mobility:  Rolling;Sidelying to Sit;Sit to Supine Rolling: Mod assist;+2 for safety/equipment Sidelying to sit: Max assist;+2 for physical assistance   Sit to supine: Max assist;+2 for physical assistance;HOB elevated   General bed mobility comments: mod assist for roll to R for truncal translation, RLE translation. Max +2 for supine<>sit for trunk and LE management, especially RLE given inattention and tone. Boost assist needed for moving up in bed, but pt assisting with LLE and and LUE.    Transfers Overall transfer level: Needs assistance Equipment used: 2 person hand held assist Transfers: Sit to/from Stand Sit to Stand: Max assist;+2 physical assistance;From elevated surface         General transfer comment: Max +2 for power up, rise, managing RLE, and steadying upon standing. Standing tolerance x10 seconds.  Ambulation/Gait             General Gait Details: nt  Financial trader Rankin (Stroke Patients Only) Modified Rankin (Stroke Patients Only) Pre-Morbid Rankin Score: No symptoms Modified Rankin: Severe disability     Balance Overall balance assessment: Needs assistance Sitting-balance support: Single extremity supported;Feet supported Sitting balance-Leahy Scale: Poor Sitting balance - Comments: R and posterior leaning, especially with fatigue Postural control: Right lateral lean;Posterior lean Standing balance support: Bilateral upper extremity supported Standing balance-Leahy Scale: Zero Standing balance comment: max +2                             Pertinent Vitals/Pain Pain Assessment: No/denies pain    Home Living Family/patient expects to be discharged to:: Private residence Living Arrangements: Spouse/significant other Available Help at Discharge: Family Type of Home: House (information  based on pt's mother's house) Home Access: Stairs to enter   [employer] of Steps: 2 (from kitchen to  [employer]) Home Layout: One level Home Equipment: Shower seat;Hand held shower head      Prior Function Level of Independence: Independent         Comments: Pt helps 72 yo mother at baseline, completely independent     Hand Dominance   Dominant Hand: Right    Extremity/Trunk Assessment   Upper Extremity Assessment Upper Extremity Assessment: Defer to OT evaluation    Lower Extremity Assessment Lower Extremity Assessment: RLE deficits/detail;LLE deficits/detail RLE Deficits / Details: 0/5 observed throughout RLE; hypertonicity with extensor tone in supine, assumes flexor tone when sitting EOB/standing RLE Sensation: decreased light touch LLE Deficits / Details: incoordination as assessed via heel-to-shin, at least 3/5 hip and knee flex/ext.    Cervical / Trunk Assessment Cervical / Trunk Assessment: Other exceptions Cervical / Trunk Exceptions: R lateral leaning functionally  Communication   Communication: No difficulties  Cognition Arousal/Alertness: Awake/alert Behavior During Therapy: Restless Overall Cognitive Status: Impaired/Different from baseline Area of Impairment: Orientation;Attention;Following commands;Safety/judgement;Problem solving;Awareness                 Orientation Level: Disoriented to;Time Current Attention Level: Sustained   Following Commands: Follows one step commands with increased time Safety/Judgement: Decreased awareness of safety Awareness: Emergent Problem Solving: Decreased initiation;Difficulty sequencing;Requires verbal cues;Requires tactile cues General Comments: Pt states year is 2021, otherwise A&O. Pt aware of CVA and that things feel "weird", but does not perceive impairements functionally (i.e. heavy R lateral leaning, R inattention). Pt demonstrating ideomotor apraxia during exam (cannot execute finger to nose initially stating "why can't I do that?"), requires max multimodal cuing for mobility.      General Comments General  comments (skin integrity, edema, etc.): SpO2 90-95% on RA, SBP 129 sitting EOB, other vss    Exercises     Assessment/Plan    PT Assessment Patient needs continued PT services  PT Problem List Decreased strength;Decreased mobility;Decreased safety awareness;Impaired tone;Decreased range of motion;Decreased coordination;Obesity;Decreased activity tolerance;Decreased cognition;Decreased balance;Decreased knowledge of use of DME;Impaired sensation       PT Treatment Interventions DME instruction;Therapeutic activities;Gait training;Therapeutic exercise;Patient/family education;Balance training;Functional mobility training;Neuromuscular re-education    PT Goals (Current goals can be found in the Care Plan section)  Acute Rehab PT Goals Patient Stated Goal: get better PT Goal Formulation: With patient/family Time For Goal Achievement: 12/16/20 Potential to Achieve Goals: Good    Frequency Min 4X/week   Barriers to discharge        Co-evaluation PT/OT/SLP Co-Evaluation/Treatment: Yes Reason for Co-Treatment: For patient/therapist safety;To address functional/ADL transfers;Complexity of the patient's impairments (multi-system involvement) PT goals addressed during session: Mobility/safety with mobility;Balance         AM-PAC PT "6 Clicks" Mobility  Outcome Measure Help needed turning from your back to your side while in a flat bed without using bedrails?: A Lot Help needed moving from lying on your back to sitting on the side of a flat bed without using bedrails?: A Lot Help needed moving to and from a bed to a chair (including a wheelchair)?: Total Help needed standing up from a chair using your arms (e.g., wheelchair or bedside chair)?: Total Help needed to walk in hospital room?: Total Help needed climbing 3-5 steps with a railing? : Total 6 Click Score: 8    End of Session Equipment Utilized During Treatment: Gait belt Activity Tolerance: Patient tolerated treatment  well Patient left: in  bed;with call bell/phone within reach;with bed alarm set;with family/visitor present;Other (comment) (on chair position in bed) Nurse Communication: Mobility status PT Visit Diagnosis: Hemiplegia and hemiparesis Hemiplegia - Right/Left: Right Hemiplegia - dominant/non-dominant: Dominant Hemiplegia - caused by: Nontraumatic intracerebral hemorrhage;Cerebral infarction    Time: [id number] PT Time Calculation (min) (ACUTE ONLY): 49 min   Charges:   PT Evaluation $PT Eval Moderate Complexity: 1 Mod         Wlliam Grosso S, PT DPT Acute Rehabilitation Services Pager [phone]  Office [phone]   Roxine Caddy E Ruffin Pyo 12/02/2020, 12:50 PM

## 2020-12-02 NOTE — Progress Notes (Signed)
Na 145. 3% infusing at 75cc/hr as ordered. Dr. Leonie Man aware, no further orders at this time.

## 2020-12-02 NOTE — Progress Notes (Signed)
STROKE TEAM PROGRESS NOTE  INTERVAL HISTORY Patient is pleasant and cooperative. COVID precautions in place.Headaches improved. Bps also improved, d/c'd clevidipine gtt. Can continue to use prns, only used once overnight.  Patient's husband is in the room at time of this exam. Patient continues to have dense right hemiplegia with some hemineglect and right-sided visual field loss. Vitals:   12/02/20 1330 12/02/20 1400 12/02/20 1430 12/02/20 1500  BP: 130/84 115/74 123/69 (!) 138/91  Pulse: 77 78 83 95  Resp: 17 (!) 22 20 (!) 28  Temp:      TempSrc:      SpO2: 93% (!) 89% (!) 88% 94%  Weight:      Height:       CBC:  Recent Labs  Lab 12/01/20 0545 12/01/20 0552  WBC 7.1  --   NEUTROABS 4.2  --   HGB 15.2* 15.3*  HCT 44.1 45.0  MCV 89.6  --   PLT 285  --     Basic Metabolic Panel:  Recent Labs  Lab 12/01/20 0545 12/01/20 0552 12/01/20 1434 12/02/20 0119 12/02/20 0734  NA 138 140   < > 144 146*  K 3.4* 3.7  --   --   --   CL 99 103  --   --   --   CO2 23  --   --   --   --   GLUCOSE 130* 132*  --   --   --   BUN 14 20  --   --   --   CREATININE 0.71 0.60  --   --   --   CALCIUM 9.5  --   --   --   --    < > = values in this interval not displayed.     Lipid Panel:  Recent Labs  Lab 12/01/20 0832  CHOL 289*  TRIG 186*  HDL 40*  CHOLHDL 7.2  VLDL 37  LDLCALC 212*     HgbA1c:  Recent Labs  Lab 12/01/20 0832  HGBA1C 5.4    Urine Drug Screen:  Recent Labs  Lab 12/01/20 0819  LABOPIA NONE DETECTED  COCAINSCRNUR NONE DETECTED  LABBENZ NONE DETECTED  AMPHETMU NONE DETECTED  THCU NONE DETECTED  LABBARB NONE DETECTED     Alcohol Level  Recent Labs  Lab 12/01/20 0545  ETH <10     IMAGING past 24 hours No results found.  PHYSICAL EXAM: Pleasant mildly obese middle-aged Caucasian lady not in distress. . Afebrile. Head is nontraumatic. Neck is supple without bruit.    Cardiac exam no murmur or gallop. Lungs are clear to auscultation. Distal  pulses are well felt.  Mental Status: Patient is awake, alert, oriented to person, place, month, year, and situation.  Patient appears anxious and at times tearful. Patient is not able to give history at this time but she recognizes that she is confused.  Clear right sided neglect but after coaching patient was able to recognize her right arm as part of her ody.   Cranial Nerves: II: Visual Fields are notable for  right hemianopia.    Pupils are equal, round, and reactive to light.   III,IV, VI: EOM lLeft gaze preference but able to look to the left past midline. V: Facial sensation is symmetric to light touch  VII: Facial movement is symmetric.  VIII: hearing is intact to voice XII: Tongue midline Unable to assess remainder given mental status  Motor: Tone is low in the RUE. Bulk is normal.  RUE 1/5, RLE triple flexion, LUE no pronator drift, LLE no drift  Sensory: Marked loss of sensation in the right arm and leg  Deep Tendon Reflexes: 3+ right biceps and patella, 2+ on the left  Plantars: Toes are mute on the right and downgoing on the left  Cerebellar: FNF and HKS are intact bilaterally  ASSESSMENT/PLAN Felicia Chen is a 55 y.o. female with history of   significant for hyperlipidemia, obesity (BMI 39.5), anxiety presenting with confusion as well as headache in the setting of recent COVID infection. CT head showed 60 cc left frontal parietal lobar hematoma with regional subarachnoid extension and Low-density appearance of the left temporal lobe of indeterminate cause, as above. Initial concern for the latter was ischemic infarction, however, MRI fortunately did not show evidence of acute ischemia.   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 60 cc left frontal parietal lobar hematoma with regiona subarachnoid extension. 2. Low-density appearance of the left temporal lobe of  indeterminate cause, as above. Recommend MR characterization when able. CTA head & neck  . No spot sign or discrete vascular lesion underlying the lobar hematoma. 2. 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 3. Mild atherosclerosis in the neck. 4. Tortuous cervical carotid arteries and possible mild cervical vertebral fibromuscular dysplasia. MRI  brain : no evidence temporal lobe ischemia as initially deszcribed on head CT. Continues to show stable size of left parietal IPH with subarachnoid extension overlying bilateral cerebral hemispheres are well as cerebellar regions. Mild edema, no midline shift.  MRV: no evidence CVST  2D Echo LVEF 60-65%, no regional wall abnormalities, LVH which is mild, grade I diastolic dysfunction  TCDs pending [  ]  LDL 212 HgbA1c 5.4 VTE prophylaxis - scd     Diet   Diet Heart Room service appropriate? Yes; Fluid consistency: Thin   No antithrombotic prior to admission, now on No antithrombotic. Cerebral bleed  Therapy recommendations:  CIR  Disposition: for acute rehab after treatment for acute COVID infection     Hypertension Home meds:   none stable BOP <140 in setting of ICH , was able to discontinue celvedipine gtt, can use labetalol or hydral prns Long-term BP goal normotensive  Hyperlipidemia Home meds:   none  LDL 212, goal < 70 Add lipitor   High intensity statin   Continue statin at discharge   HgbA1c 5.4, goal < 7.0 CBGs Recent Labs    12/01/20 0545  GLUCAP 126*    SSI  Other Stroke Risk Factors  Obesity, Body mass index is 39.52 kg/m., BMI >/= 30 associated with increased stroke risk, recommend weight loss, diet and exercise as appropriate   Other Active Problems COVID infection, active. On remdesivir and dexamethasone: appreciate management per CCM   Headache: much improved and BP stabilizing, no role nimodipine  Depression: had been on zoloft '100mg'$  outpatient.  This was discontinued due to suspicion RCVS and she was alternatively placed on buspar '5mg'$ . Can also use prn atarax, which is a home medication.   Hospital day # 1  Felicia Chen, [facility]  I have personally obtained history,examined this patient, reviewed notes, independently viewed imaging studies, participated in medical decision making and plan of care.ROS completed by me personally and pertinent positives fully documented  I have made any additions or clarifications directly to the above note. Agree with note above.  Mobilize out of bed.  Continue ongoing therapies.  Continue COVID treatment as per CCM.  Discussed with Dr. Tacy Learn and patient's husband.This patient is critically ill and at significant risk of neurological worsening, death and care requires constant monitoring of vital signs, hemodynamics,respiratory and cardiac monitoring, extensive review of multiple databases, frequent neurological assessment, discussion with family, other specialists and medical decision making of high complexity.I have made any additions or clarifications directly to the above note.This critical care time does not reflect procedure time, or teaching time or supervisory time of PA/NP/Med Resident etc but could involve care discussion time.  I spent 30 minutes of neurocritical care time  in the care of  this patient.      Felicia Contras, MD Medical Director [facility] Pager: [phone] 12/02/2020 6:21 PM Strokes 1 deficits on the NIH stroke scale To contact Stroke Continuity provider, please refer to [email or web address] After hours, contact General Neurology

## 2020-12-02 NOTE — Progress Notes (Signed)
NAME:  Felicia Chen, MRN:  [id number], DOB:  13-Jan-1966, LOS: 1 ADMISSION DATE:  12/01/2020, CONSULTATION DATE: 11/21/2020 REFERRING MD: Dr. Leonie Man, CHIEF COMPLAINT: Headache and shortness of breath  History of Present Illness:  55 year old female with hyperlipidemia and obesity was brought in the emergency department with increasing headache and right-sided neglect with weakness.  Stroke code was called, patient was noted to have left frontal parietal intraparenchymal hemorrhage with acute ischemic stroke of left temporal region.  Patient was diagnosed with COVID few days ago, now with increasing shortness of breath requiring oxygen, PCCM was consulted for help with management. Patient continued complain of headache and shortness of breath, denies fever, chills, nausea vomiting, dysuria, urgency, frequency or other complaints  Significant Hospital Events: Including procedures, antibiotic start and stop dates in addition to other pertinent events   9/14 admitted under stroke service, PCCM was consulted  Interim History / Subjective:  Remains on 2 L oxygen via nasal cannula, afebrile  Objective   Blood pressure 133/71, pulse 85, temperature 98.9 F (37.2 C), temperature source Oral, resp. rate (!) 28, height '5\' 8"'$  (1.727 m), weight 117.9 kg, SpO2 93 %.        Intake/Output Summary (Last 24 hours) at 12/02/2020 1027 Last data filed at 12/02/2020 1000 Gross per 24 hour  Intake 2111.7 ml  Output 2525 ml  Net -413.3 ml   Filed Weights   12/01/20 0703  Weight: 117.9 kg    Examination:   Physical exam: General: Crtitically ill-appearing obese middle-aged Caucasian female, on nasal cannula oxygen HEENT: [place]/AT, eyes anicteric.  Severely dry mucous membranes Neuro: Alert, awake, forgetful.  Following simple commands on left side, dense plegia on right side Chest: Coarse breath sounds, no wheezes or rhonchi Heart: Regular rate and rhythm, no murmurs or gallops Abdomen: Soft,  nontender, nondistended, bowel sounds present Skin: No rash  Resolved Hospital Problem list     Assessment & Plan:  Acute left frontoparietal intraparenchymal hemorrhage with associated subarachnoid hemorrhage and cerebral brain edema, ICH score of 1, could be related to her CVS Acute hypoxic respiratory failure due to COVID-19 pneumonia Morbid obesity Hyperlipidemia Hypertension  Continue neuro watch every hour PT/OT  evaluation Closely monitor blood pressure with SBP goal 120-140 Continue clevidipine as needed MRI and MRA brain ruled out cortical vein thrombosis, confirmed left-sided frontoparietal hemorrhage Continue 3% saline to help reduce cytotoxic cerebral edema, serum sodium goal 150-155 Continue oxygen via nasal cannula Continue contact and airborne isolation Continue remdesivir and dexamethasone Nutritionist follow-up Continue atorvastatin Avoid antiplatelet agent   Best Practice (right click and "Reselect all SmartList Selections" daily)   Diet/type: Heart healthy diet DVT prophylaxis: SCD GI prophylaxis: N/A Lines: N/A Foley:  N/A Code Status:  full code Last date of multidisciplinary goals of care discussion [9/15: Patient has been was updated at bedside]  Labs   CBC: Recent Labs  Lab 12/01/20 0545 12/01/20 0552  WBC 7.1  --   NEUTROABS 4.2  --   HGB 15.2* 15.3*  HCT 44.1 45.0  MCV 89.6  --   PLT 285  --     Basic Metabolic Panel: Recent Labs  Lab 12/01/20 0545 12/01/20 0552 12/01/20 1434 12/01/20 2023 12/02/20 0119 12/02/20 0734  NA 138 140 139 139 144 146*  K 3.4* 3.7  --   --   --   --   CL 99 103  --   --   --   --   CO2 23  --   --   --   --   --  GLUCOSE 130* 132*  --   --   --   --   BUN 14 20  --   --   --   --   CREATININE 0.71 0.60  --   --   --   --   CALCIUM 9.5  --   --   --   --   --    GFR: Estimated Creatinine Clearance: 106 mL/min (by C-G formula based on SCr of 0.6 mg/dL). Recent Labs  Lab 12/01/20 0545  WBC 7.1     Liver Function Tests: Recent Labs  Lab 12/01/20 0545  AST 29  ALT 41  ALKPHOS 54  BILITOT 1.5*  PROT 6.8  ALBUMIN 3.8   No results for input(s): LIPASE, AMYLASE in the last 168 hours. No results for input(s): AMMONIA in the last 168 hours.  ABG    Component Value Date/Time   TCO2 27 12/01/2020 0552     Coagulation Profile: Recent Labs  Lab 12/01/20 0545  INR 1.0    Cardiac Enzymes: No results for input(s): CKTOTAL, CKMB, CKMBINDEX, TROPONINI in the last 168 hours.  HbA1C: Hgb A1c MFr Bld  Date/Time Value Ref Range Status  12/01/2020 08:32 AM 5.4 4.8 - 5.6 % Final    Comment:    (NOTE) Pre diabetes:          5.7%-6.4%  Diabetes:              >6.4%  Glycemic control for   <7.0% adults with diabetes     CBG: Recent Labs  Lab 12/01/20 0545  GLUCAP 126*      Total critical care time: 39 minutes  Performed by: Central care time was exclusive of separately billable procedures and treating other patients.   Critical care was necessary to treat or prevent imminent or life-threatening deterioration.   Critical care was time spent personally by me on the following activities: development of treatment plan with patient and/or surrogate as well as nursing, discussions with consultants, evaluation of patient's response to treatment, examination of patient, obtaining history from patient or surrogate, ordering and performing treatments and interventions, ordering and review of laboratory studies, ordering and review of radiographic studies, pulse oximetry and re-evaluation of patient's condition.   Jacky Kindle MD [facility] See Amion for pager If no response to pager, please call [phone] until 7pm After 7pm, Please call E-link [phone]

## 2020-12-02 NOTE — Progress Notes (Signed)
PIV consult for second site. Pt with hypertonic saline infusing. Early phlebitis noted at L lateral AC site. 2 PIVs placed in LUE. Recommend central line for hypertonic saline as pt has had one infiltration already. For now, please use upper arm site as it is a larger vein.

## 2020-12-02 NOTE — Progress Notes (Signed)
Attempted TCD, however patient is receiving nursing care, and phlebotomy is entering pt room. Will attempt again as schedule permits.  12/02/2020 2:34 PM Kelby Aline., MHA, RVT, RDCS, RDMS

## 2020-12-03 DIAGNOSIS — G9341 Metabolic encephalopathy: Secondary | ICD-10-CM

## 2020-12-03 LAB — MAGNESIUM: Magnesium: 2.1 mg/dL (ref 1.7–2.4)

## 2020-12-03 LAB — CBC
HCT: 39.9 % (ref 36.0–46.0)
Hemoglobin: 13.6 g/dL (ref 12.0–15.0)
MCH: 31.1 pg (ref 26.0–34.0)
MCHC: 34.1 g/dL (ref 30.0–36.0)
MCV: 91.3 fL (ref 80.0–100.0)
Platelets: 289 10*3/uL (ref 150–400)
RBC: 4.37 MIL/uL (ref 3.87–5.11)
RDW: 12.9 % (ref 11.5–15.5)
WBC: 10.6 10*3/uL — ABNORMAL HIGH (ref 4.0–10.5)
nRBC: 0 % (ref 0.0–0.2)

## 2020-12-03 LAB — COMPREHENSIVE METABOLIC PANEL
ALT: 34 U/L (ref 0–44)
AST: 26 U/L (ref 15–41)
Albumin: 3.5 g/dL (ref 3.5–5.0)
Alkaline Phosphatase: 38 U/L (ref 38–126)
Anion gap: 6 (ref 5–15)
BUN: 13 mg/dL (ref 6–20)
CO2: 25 mmol/L (ref 22–32)
Calcium: 8.5 mg/dL — ABNORMAL LOW (ref 8.9–10.3)
Chloride: 114 mmol/L — ABNORMAL HIGH (ref 98–111)
Creatinine, Ser: 0.62 mg/dL (ref 0.44–1.00)
GFR, Estimated: >60 mL/min (ref >60)
Glucose, Bld: 125 mg/dL — ABNORMAL HIGH (ref 70–99)
Potassium: 3.8 mmol/L (ref 3.5–5.1)
Sodium: 145 mmol/L (ref 135–145)
Total Bilirubin: 1.5 mg/dL — ABNORMAL HIGH (ref 0.3–1.2)
Total Protein: 6 g/dL — ABNORMAL LOW (ref 6.5–8.1)

## 2020-12-03 LAB — SODIUM
Sodium: 144 mmol/L (ref 135–145)
Sodium: 143 mmol/L (ref 135–145)
Sodium: 144 mmol/L (ref 135–145)

## 2020-12-03 MED ORDER — SODIUM CHLORIDE 3 % IV SOLN
INTRAVENOUS | Status: AC
  Filled 2020-12-03: qty 500

## 2020-12-03 MED ORDER — HYDRALAZINE HCL 20 MG/ML IJ SOLN
20.0000 mg | INTRAMUSCULAR | Status: DC | PRN
  Administered 2020-12-03: 20 mg via INTRAVENOUS
  Filled 2020-12-03: qty 1

## 2020-12-03 MED ORDER — SODIUM CHLORIDE 3 % IV SOLN: INTRAVENOUS | Status: DC

## 2020-12-03 MED ORDER — ATORVASTATIN CALCIUM 80 MG PO TABS
80.0000 mg | ORAL_TABLET | Freq: Every day | ORAL | Status: DC
  Administered 2020-12-04 – 2020-12-07 (×4): 80 mg via ORAL
  Filled 2020-12-03 (×4): qty 1

## 2020-12-03 MED ORDER — LABETALOL HCL 5 MG/ML IV SOLN
20.0000 mg | INTRAVENOUS | Status: DC | PRN
  Administered 2020-12-03 (×2): 20 mg via INTRAVENOUS
  Filled 2020-12-03: qty 4

## 2020-12-03 MED ORDER — ALPRAZOLAM 0.5 MG PO TABS
0.5000 mg | ORAL_TABLET | Freq: Three times a day (TID) | ORAL | Status: DC
  Administered 2020-12-03: 0.5 mg via ORAL
  Administered 2020-12-03: 0.25 mg via ORAL
  Administered 2020-12-03 – 2020-12-07 (×11): 0.5 mg via ORAL
  Filled 2020-12-03 (×14): qty 1

## 2020-12-03 NOTE — Progress Notes (Signed)
NAME:  Felicia Chen, MRN:  [id number], DOB:  [birth year], LOS: 2 ADMISSION DATE:  12/01/2020, CONSULTATION DATE: 11/21/2020 REFERRING MD: Dr. Leonie Man, CHIEF COMPLAINT: Headache and shortness of breath  History of Present Illness:  55 year old female with hyperlipidemia and obesity was brought in the emergency department with increasing headache and right-sided neglect with weakness.  Stroke code was called, patient was noted to have left frontal parietal intraparenchymal hemorrhage with acute ischemic stroke of left temporal region.  Patient was diagnosed with COVID few days ago, now with increasing shortness of breath requiring oxygen, PCCM was consulted for help with management. Patient continued complain of headache and shortness of breath, denies fever, chills, nausea vomiting, dysuria, urgency, frequency or other complaints  Significant Hospital Events: Including procedures, antibiotic start and stop dates in addition to other pertinent events   9/14 admitted under stroke service, PCCM was consulted  Interim History / Subjective:  No overnight issues. On 2LNC. On HTS and had IV infiltrate overnight.   Objective   Blood pressure (!) 160/80, pulse 68, temperature 98.6 F (37 C), temperature source Oral, resp. rate 20, height '5\' 8"'$  (1.727 m), weight 117.9 kg, SpO2 93 %.        Intake/Output Summary (Last 24 hours) at 12/03/2020 1005 Last data filed at 12/03/2020 0800 Gross per 24 hour  Intake 1556.94 ml  Output 1850 ml  Net -293.06 ml   Filed Weights   12/01/20 0703  Weight: 117.9 kg    Examination:   Physical exam: General: Crtitically ill-appearing obese middle-aged Caucasian female, on nasal cannula oxygen HEENT: [place]/AT, eyes anicteric.  Severely dry mucous membranes Neuro: awake, alert, follows commands, right sided hemiplegia, encephalopathic and forgetful Chest: no increased wob, no wheezes or crackles Heart: RRR no mrg Abdomen: soft, nt, nd Skin: No  rash  Resolved Hospital Problem list     Assessment & Plan:  Acute left frontoparietal intraparenchymal hemorrhage with associated subarachnoid hemorrhage and cerebral brain edema, ICH score of 1, could be related to her CVS Mild encephalopathy possible related to cerebral edema Acute hypoxic respiratory failure due to COVID-19 pneumonia Morbid obesity Hyperlipidemia Hypertension  Continue neuro watch every hour PT/OT  evaluation Closely monitor blood pressure with SBP goal 120-140. Off clevidipine now.  MRI and MRA brain ruled out cortical vein thrombosis, confirmed left-sided frontoparietal hemorrhage Continue 3% saline to help reduce cytotoxic cerebral edema, serum sodium goal 150-155. Will talk with neurology today to see when they would like to start tapering this.  Continue oxygen via nasal cannula. Goal saturations over 90%.  Continue contact and airborne isolation Continue remdesivir and dexamethasone Nutritionist follow-up Continue atorvastatin Avoid antiplatelet agent   Best Practice (right click and "Reselect all SmartList Selections" daily)   Diet/type: Heart healthy diet DVT prophylaxis: SCD GI prophylaxis: N/A Lines: N/A Foley:  N/A Code Status:  full code Last date of multidisciplinary goals of care discussion [9/16: Patient and husband have been updated at bedside]  The patient is critically ill due to encephalopathy and cerebral edema.  Critical care was necessary to treat or prevent imminent or life-threatening deterioration.  Critical care was time spent personally by me on the following activities: development of treatment plan with patient and/or surrogate as well as nursing, discussions with consultants, evaluation of patient's response to treatment, examination of patient, obtaining history from patient or surrogate, ordering and performing treatments and interventions, ordering and review of laboratory studies, ordering and review of radiographic studies,  pulse oximetry, re-evaluation of patient's condition and  participation in multidisciplinary rounds.   Critical Care Time devoted to patient care services described in this note is 31 minutes. This time reflects time of care of this [place] . This critical care time does not reflect separately billable procedures or procedure time, teaching time or supervisory time of PA/NP/Med student/Med Resident etc but could involve care discussion time.       Spero Geralds [facility] 12/03/2020 10:07 AM  Pager: see AMION  If no response to pager , please call critical care on call (see AMION) until 7pm After 7:00 pm call Elink     Labs   CBC: Recent Labs  Lab 12/01/20 0545 12/01/20 0552 12/03/20 0751  WBC 7.1  --  10.6*  NEUTROABS 4.2  --   --   HGB 15.2* 15.3* 13.6  HCT 44.1 45.0 39.9  MCV 89.6  --  91.3  PLT 285  --  [id number]    Basic Metabolic Panel: Recent Labs  Lab 12/01/20 0545 12/01/20 0552 12/01/20 1434 12/02/20 0734 12/02/20 1451 12/02/20 2015 12/03/20 0143 12/03/20 0751  NA 138 140   < > 146* 145 145 144 145  K 3.4* 3.7  --   --   --   --   --  3.8  CL 99 103  --   --   --   --   --  114*  CO2 23  --   --   --   --   --   --  25  GLUCOSE 130* 132*  --   --   --   --   --  125*  BUN 14 20  --   --   --   --   --  13  CREATININE 0.71 0.60  --   --   --   --   --  0.62  CALCIUM 9.5  --   --   --   --   --   --  8.5*  MG  --   --   --   --   --   --   --  2.1   < > = values in this interval not displayed.   GFR: Estimated Creatinine Clearance: 106 mL/min (by C-G formula based on SCr of 0.62 mg/dL). Recent Labs  Lab 12/01/20 0545 12/03/20 0751  WBC 7.1 10.6*    Liver Function Tests: Recent Labs  Lab 12/01/20 0545 12/03/20 0751  AST 29 26  ALT 41 34  ALKPHOS 54 38  BILITOT 1.5* 1.5*  PROT 6.8 6.0*  ALBUMIN 3.8 3.5   No results for input(s): LIPASE, AMYLASE in the last 168 hours. No results for input(s): AMMONIA  in the last 168 hours.  ABG    Component Value Date/Time   TCO2 27 12/01/2020 0552     Coagulation Profile: Recent Labs  Lab 12/01/20 0545  INR 1.0    Cardiac Enzymes: No results for input(s): CKTOTAL, CKMB, CKMBINDEX, TROPONINI in the last 168 hours.  HbA1C: Hgb A1c MFr Bld  Date/Time Value Ref Range Status  12/01/2020 08:32 AM 5.4 4.8 - 5.6 % Final    Comment:    (NOTE) Pre diabetes:          5.7%-6.4%  Diabetes:              >6.4%  Glycemic control for   <7.0% adults with diabetes     CBG: Recent Labs  Lab 12/01/20 0545  GLUCAP 126*

## 2020-12-03 NOTE — Progress Notes (Signed)
Physical Therapy Treatment Patient Details Name: Felicia Chen MRN: [id number] DOB: [birth year] Today's Date: 12/03/2020   History of Present Illness 55 yo female [facility]14 with intense headache, R hemiplegia. CTH/MRI shows 60 cc left frontal parietal lobar hematoma with SAH extending over bilat cerebral hemispheres and cerebellum, L temporal lobe ischemic CVA. Pt also with covid-19. PMH includes anxiety, Gilbert's syndrome, obesity, HLD.    PT Comments    Progression to OOB today and standing for balance, midline orientation and R LE weight bearing for tone inhibition.  Patient appropriate for CIR level rehab.  Spouse eager to assist and very attentive.  PT to continue to follow acutely.    Recommendations for follow up therapy are one component of a multi-disciplinary discharge planning process, led by the attending physician.  Recommendations may be updated based on patient status, additional functional criteria and insurance authorization.  Follow Up Recommendations  CIR     Equipment Recommendations  3in1 (PT);Wheelchair (measurements PT);Wheelchair cushion (measurements PT)    Recommendations for Other Services       Precautions / Restrictions Precautions Precautions: Fall Precaution Comments: R hemi, inattention. SBP parameters 120-140     Mobility  Bed Mobility Overal bed mobility: Needs Assistance Bed Mobility: Rolling Rolling: Mod assist;+2 for safety/equipment Sidelying to sit: Max assist;+2 for physical assistance       General bed mobility comments: patient trying to initiate rolling to L by using on L side until cued and assisted to reach with L hand for R hand and assist to turn then assisted legs off bed and trunk upright    Transfers Overall transfer level: Needs assistance Equipment used: 2 person hand held assist (handle on back of recliner) Transfers: Sit to/from [employer] Sit to Stand: Max assist;+2 physical  assistance   Squat pivot transfers: Mod assist;+2 physical assistance     General transfer comment: Assist initially to stand without device and pt pushing badly to R so placed back of recliner with handle near L side and pt able to stand enough for PT to place R foot in good position to limit flexor tone and allow weight bearing and more upright posture; pivot to recliner with drop arm and mod cues reaching with L hand to opposite armrest with multimodal cues and guiding hips into chair  Ambulation/Gait                 Stairs             Wheelchair Mobility    Modified Rankin (Stroke Patients Only) Modified Rankin (Stroke Patients Only) Pre-Morbid Rankin Score: No symptoms Modified Rankin: Severe disability     Balance Overall balance assessment: Needs assistance Sitting-balance support: Single extremity supported Sitting balance-Leahy Scale: Poor Sitting balance - Comments: leans R when has something to push into, can sit with min A to CGA when static and positioned, loses balance to R with functional tasks in sitting Postural control: Right lateral lean Standing balance support: Single extremity supported Standing balance-Leahy Scale: Poor Standing balance comment: initially standing pt pushing R and R LE in flexor synergy; with handle from back of recliner on L side pt able to use to pull up and for balance on L foot so PT could place R foot in weight bearing position and decrease tone, pt stood for about 2 minutes and performed some lateral weight shifts wtih A of 2  Cognition Arousal/Alertness: Awake/alert Behavior During Therapy: Impulsive Overall Cognitive Status: Impaired/Different from baseline Area of Impairment: Attention;Safety/judgement;Problem solving;Awareness                   Current Attention Level: Sustained   Following Commands: Follows one step commands with increased time;Follows one step commands  inconsistently (and multimodal cues) Safety/Judgement: Decreased awareness of safety;Decreased awareness of deficits Awareness: Emergent Problem Solving: Slow processing;Difficulty sequencing;Requires verbal cues;Requires tactile cues        Exercises Other Exercises Other Exercises: Performed R LE PROM to reduce extensor tone in supine and attempted bridging in supine, but pt unable to utilize R LE despite cues so performed lateral trunk rotation PROM/AAROM for tone inhibition    General Comments General comments (skin integrity, edema, etc.): Spouse present in the room and very eager to assist; pt c/o not being able to see due to not having her glasses      Pertinent Vitals/Pain      Home Living                      Prior Function            PT Goals (current goals can now be found in the care plan section) Progress towards PT goals: Progressing toward goals    Frequency    Min 4X/week      PT Plan Current plan remains appropriate    Co-evaluation              AM-PAC PT "6 Clicks" Mobility   Outcome Measure  Help needed turning from your back to your side while in a flat bed without using bedrails?: A Lot Help needed moving from lying on your back to sitting on the side of a flat bed without using bedrails?: Total Help needed moving to and from a bed to a chair (including a wheelchair)?: Total Help needed standing up from a chair using your arms (e.g., wheelchair or bedside chair)?: Total Help needed to walk in hospital room?: Total Help needed climbing 3-5 steps with a railing? : Total 6 Click Score: 7    End of Session Equipment Utilized During Treatment: Gait belt Activity Tolerance: Patient tolerated treatment well Patient left: in chair;with call bell/phone within reach;with family/visitor present;with chair alarm set Nurse Communication: Mobility status;Need for lift equipment PT Visit Diagnosis: Hemiplegia and hemiparesis;Other symptoms  and signs involving the nervous system (R29.898);Other abnormalities of gait and mobility (R26.89) Hemiplegia - Right/Left: Right Hemiplegia - dominant/non-dominant: Dominant Hemiplegia - caused by: Nontraumatic intracerebral hemorrhage;Cerebral infarction     Time: 1035-1105 PT Time Calculation (min) (ACUTE ONLY): 30 min  Charges:  $Therapeutic Activity: 8-22 mins $Neuromuscular Re-education: 8-22 mins                     Magda Kiel, PT Acute Rehabilitation Services Pager:[phone] [phone] 12/03/2020    Felicia Chen 12/03/2020, 1:11 PM

## 2020-12-03 NOTE — Progress Notes (Addendum)
STROKE TEAM PROGRESS NOTE  INTERVAL HISTORY Patient   remains pleasant and cooperative.  Marland KitchenHeadaches improved. Bps also improved, d/c'd clevidipine gtt. Can continue to use prns, only used once overnight.    Patient continues to have dense right hemiplegia with some hemineglect and right-sided visual field loss.  Vitals:   12/03/20 0600 12/03/20 0645 12/03/20 0700 12/03/20 0800  BP: (!) 163/96 (!) 149/87 (!) 153/76 (!) 160/80  Pulse: 76 72 76 68  Resp: (!) 22 (!) 21 (!) 23 20  Temp:      TempSrc:      SpO2: 91% 90% 96% 93%  Weight:      Height:       CBC:  Recent Labs  Lab 12/01/20 0545 12/01/20 0552 12/03/20 0751  WBC 7.1  --  10.6*  NEUTROABS 4.2  --   --   HGB 15.2* 15.3* 13.6  HCT 44.1 45.0 39.9  MCV 89.6  --  91.3  PLT 285  --  [id number]   Basic Metabolic Panel:  Recent Labs  Lab 12/01/20 0545 12/01/20 0552 12/01/20 1434 12/02/20 2015 12/03/20 0143  NA 138 140   < > 145 144  K 3.4* 3.7  --   --   --   CL 99 103  --   --   --   CO2 23  --   --   --   --   GLUCOSE 130* 132*  --   --   --   BUN 14 20  --   --   --   CREATININE 0.71 0.60  --   --   --   CALCIUM 9.5  --   --   --   --    < > = values in this interval not displayed.    Lipid Panel:  Recent Labs  Lab 12/01/20 0832  CHOL 289*  TRIG 186*  HDL 40*  CHOLHDL 7.2  VLDL 37  LDLCALC 212*    HgbA1c:  Recent Labs  Lab 12/01/20 0832  HGBA1C 5.4   Urine Drug Screen:  Recent Labs  Lab 12/01/20 0819  LABOPIA NONE DETECTED  COCAINSCRNUR NONE DETECTED  LABBENZ NONE DETECTED  AMPHETMU NONE DETECTED  THCU NONE DETECTED  LABBARB NONE DETECTED    Alcohol Level  Recent Labs  Lab 12/01/20 0545  ETH <10    IMAGING past 24 hours VAS [place] TRANSCRANIAL DOPPLER  Result Date: 12/02/2020  Transcranial Doppler Patient Name:  Felicia Chen  Date of Exam:   12/02/2020 Medical Rec #: [id number]            Accession #:    [id number] Date of Birth: 1965-09-14           Patient Gender: F Patient Age:   55  years Exam Location:  [facility] Procedure:      VAS [place] TRANSCRANIAL DOPPLER Referring Phys: Amere Iott --------------------------------------------------------------------------------  Indications: Stroke. Comparison Study: No prior study Performing Technologist: Maudry Mayhew MHA, RDMS, RVT, RDCS  Examination Guidelines: A complete evaluation includes B-mode imaging, spectral Doppler, color Doppler, and power Doppler as needed of all accessible portions of each vessel. Bilateral testing is considered an integral part of a complete examination. Limited examinations for reoccurring indications may be performed as noted.  +----------+-------------+----------+-----------+-------+ RIGHT TCD Right VM (cm)Depth (cm)PulsatilityComment +----------+-------------+----------+-----------+-------+ MCA           57.00                 1.17            +----------+-------------+----------+-----------+-------+  ACA          -51.00                 1.40            +----------+-------------+----------+-----------+-------+ Term ICA      30.00                 1.21            +----------+-------------+----------+-----------+-------+ PCA           41.00                 1.18            +----------+-------------+----------+-----------+-------+ Opthalmic     26.00                 1.21            +----------+-------------+----------+-----------+-------+ ICA siphon    30.00                 1.25            +----------+-------------+----------+-----------+-------+ Vertebral    -32.00                 1.43            +----------+-------------+----------+-----------+-------+  +----------+------------+----------+-----------+------------------+ LEFT TCD  Left VM (cm)Depth (cm)Pulsatility     Comment       +----------+------------+----------+-----------+------------------+ MCA          43.00                 1.24                        +----------+------------+----------+-----------+------------------+ ACA                                        Unable to insonate +----------+------------+----------+-----------+------------------+ Term ICA     30.00                 1.55                       +----------+------------+----------+-----------+------------------+ PCA          41.00                 1.14                       +----------+------------+----------+-----------+------------------+ Opthalmic    18.00                 1.49                       +----------+------------+----------+-----------+------------------+ ICA siphon   38.00                 1.57                       +----------+------------+----------+-----------+------------------+ Vertebral    -22.00                1.34                       +----------+------------+----------+-----------+------------------+  +------------+------+------------------+             VM cm      Comment       +------------+------+------------------+  Prox Basilar-27.00                   +------------+------+------------------+ Dist Basilar      Unable to insonate +------------+------+------------------+    Preliminary     PHYSICAL EXAM: Pleasant mildly obese middle-aged Caucasian lady not in distress. . Afebrile. Head is nontraumatic. Neck is supple without bruit.    Cardiac exam no murmur or gallop. Lungs are clear to auscultation. Distal pulses are well felt.  Mental Status: Patient is awake, alert, oriented to person, place, month, year, and situation.  Patient appears anxious and at times tearful. Patient is not able to give history at this time but she recognizes that she is confused.  Clear right sided neglect but after coaching patient was able to recognize her right arm as part of her ody.   Cranial Nerves: II: Visual Fields are notable for  right hemianopia.    Pupils are equal, round, and reactive to light.   III,IV, VI: EOM lLeft gaze  preference but able to look to the left past midline. V: Facial sensation is symmetric to light touch  VII: Facial movement is symmetric.  VIII: hearing is intact to voice XII: Tongue midline Unable to assess remainder given mental status  Motor: Tone is low in the RUE. Bulk is normal. RUE 1/5, RLE triple flexion, LUE no pronator drift, LLE no drift  Sensory: Marked loss of sensation in the right arm and leg .  Hemineglect improved Deep Tendon Reflexes: 3+ right biceps and patella, 2+ on the left  Plantars: Toes are mute on the right and downgoing on the left  Cerebellar: FNF and HKS are intact bilaterally  ASSESSMENT/PLAN Ms. Felicia Chen is a 55 y.o. female with history of   significant for hyperlipidemia, obesity (BMI 39.5), anxiety presenting with confusion as well as headache in the setting of recent COVID infection. CT head showed 60 cc left frontal parietal lobar hematoma with regional subarachnoid extension and Low-density appearance of the left temporal lobe of indeterminate cause, as above. Initial concern for the latter was ischemic infarction, however, MRI fortunately did not show evidence of acute ischemia.   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 60 cc left frontal parietal lobar hematoma with regiona subarachnoid extension. 2. Low-density appearance of the left temporal lobe of indeterminate cause, as above. Recommend MR characterization when able. CTA head & neck  . No spot sign or discrete vascular lesion underlying the lobar hematoma. 2. 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 3. Mild atherosclerosis in the neck. 4. Tortuous cervical carotid arteries and possible mild cervical vertebral fibromuscular dysplasia. MRI  brain  : no evidence temporal lobe ischemia as initially deszcribed on head CT. Continues to show stable size of left parietal IPH with subarachnoid extension overlying bilateral cerebral hemispheres are well as cerebellar regions. Mild edema, no midline shift.  MRV: no evidence CVST  2D Echo LVEF 60-65%, no regional wall abnormalities, LVH which is mild, grade I diastolic dysfunction  TCDs no evidence of vasospasm  LDL 212 HgbA1c 5.4 VTE prophylaxis - scd     Diet   Diet Heart Room service appropriate? Yes; Fluid consistency: Thin   No antithrombotic prior to admission, now on No antithrombotic. Cerebral bleed  Therapy recommendations:  CIR  Disposition: for acute  rehab after treatment for acute COVID infection     Hypertension Home meds:   none stable BOP <160 in setting of ICH , w hydralazine prn first  Long-term BP goal normotensive  Hyperlipidemia Home meds:   none  LDL 212, goal < 70 Add lipitor   High intensity statin   Continue statin at discharge   HgbA1c 5.4, goal < 7.0 CBGs Recent Labs    12/01/20 0545  GLUCAP 126*   SSI  Anxiety Add Xanax 0.'5mg'$  TID scheduled DC IV Ativan   Other Stroke Risk Factors  Obesity, Body mass index is 39.52 kg/m., BMI >/= 30 associated with increased stroke risk, recommend weight loss, diet and exercise as appropriate   Other Active Problems COVID infection, active. On remdesivir and dexamethasone: appreciate management per CCM   Headache: much improved and BP stabilizing, no role nimodipine  Depression: had been on zoloft '100mg'$  outpatient. This was discontinued due to suspicion RCVS and she was alternatively placed on buspar '5mg'$ . Can also use prn atarax, which is a home medication.  Have also added Xanax to BuSpar kicks in Possible RCVS triggered by Zoloft and COVID infection.  Strict blood pressure control with systolic 0000000 and have discontinued Zoloft and COVID infection is being treated Consider repeat CT angiogram in 1 week  prior to discharge to look for resolution of cerebral vaso-constriction Hospital day # 2  Patient continues to show slow improvement.  Recommend tapering and discontinuing hypertonic saline over the next 24 hours.  Continue mobilize out of bed and ongoing therapy consults.  Continue tight blood pressure control.  Add Xanax for anxiety and continue BuSpar.  Long discussion with patient and pharmacy nurse and answered questions.  Hopefully transfer to neurology floor bed in the next couple of days.  We will asked medical hospitalist team to take over for help manage COVID once she is on the floor. This patient is critically ill and at significant risk of neurological worsening, death and care requires constant monitoring of vital signs, hemodynamics,respiratory and cardiac monitoring, extensive review of multiple databases, frequent neurological assessment, discussion with family, other specialists and medical decision making of high complexity.I have made any additions or clarifications directly to the above note.This critical care time does not reflect procedure time, or teaching time or supervisory time of PA/NP/Med Resident etc but could involve care discussion time.  I spent 30 minutes of neurocritical care time  in the care of  this patient.  Discussed with Dr. Lenice Llamas critical care medicine.  Antony Contras MD     To contact Stroke Continuity provider, please refer to [email or web address] After hours, contact General Neurology

## 2020-12-03 NOTE — Progress Notes (Addendum)
Wasted 1 mg of Lorazepam (Ativan) with Wyn Quaker, RN.  Normajean Baxter, RN

## 2020-12-04 DIAGNOSIS — I609 Nontraumatic subarachnoid hemorrhage, unspecified: Secondary | ICD-10-CM

## 2020-12-04 DIAGNOSIS — I161 Hypertensive emergency: Secondary | ICD-10-CM

## 2020-12-04 DIAGNOSIS — E782 Mixed hyperlipidemia: Secondary | ICD-10-CM

## 2020-12-04 LAB — BASIC METABOLIC PANEL
Anion gap: 9 (ref 5–15)
BUN: 14 mg/dL (ref 6–20)
CO2: 24 mmol/L (ref 22–32)
Calcium: 8.5 mg/dL — ABNORMAL LOW (ref 8.9–10.3)
Chloride: 111 mmol/L (ref 98–111)
Creatinine, Ser: 0.63 mg/dL (ref 0.44–1.00)
GFR, Estimated: >60 mL/min (ref >60)
Glucose, Bld: 97 mg/dL (ref 70–99)
Potassium: 3.4 mmol/L — ABNORMAL LOW (ref 3.5–5.1)
Sodium: 144 mmol/L (ref 135–145)

## 2020-12-04 LAB — CBC
HCT: 37.7 % (ref 36.0–46.0)
Hemoglobin: 12.8 g/dL (ref 12.0–15.0)
MCH: 31.1 pg (ref 26.0–34.0)
MCHC: 34 g/dL (ref 30.0–36.0)
MCV: 91.5 fL (ref 80.0–100.0)
Platelets: 282 10*3/uL (ref 150–400)
RBC: 4.12 MIL/uL (ref 3.87–5.11)
RDW: 13 % (ref 11.5–15.5)
WBC: 10.8 10*3/uL — ABNORMAL HIGH (ref 4.0–10.5)
nRBC: 0 % (ref 0.0–0.2)

## 2020-12-04 LAB — SODIUM: Sodium: 143 mmol/L (ref 135–145)

## 2020-12-04 LAB — CARDIOLIPIN ANTIBODIES, IGG, IGM, IGA
Anticardiolipin IgA: <9 APL U/mL (ref 0–11)
Anticardiolipin IgG: <9 GPL U/mL (ref 0–14)
Anticardiolipin IgM: <9 MPL U/mL (ref 0–12)

## 2020-12-04 MED ORDER — POTASSIUM CHLORIDE CRYS ER 20 MEQ PO TBCR
40.0000 meq | EXTENDED_RELEASE_TABLET | Freq: Once | ORAL | Status: AC
  Administered 2020-12-04: 40 meq via ORAL
  Filled 2020-12-04: qty 2

## 2020-12-04 MED ORDER — ENSURE SURGERY PO LIQD
237.0000 mL | Freq: Three times a day (TID) | ORAL | Status: DC
  Filled 2020-12-04 (×2): qty 237

## 2020-12-04 MED ORDER — LABETALOL HCL 5 MG/ML IV SOLN
5.0000 mg | INTRAVENOUS | Status: DC | PRN
  Administered 2020-12-04 – 2020-12-05 (×2): 10 mg via INTRAVENOUS
  Administered 2020-12-06: 20 mg via INTRAVENOUS
  Filled 2020-12-04 (×3): qty 4

## 2020-12-04 MED ORDER — PANTOPRAZOLE SODIUM 40 MG PO TBEC
40.0000 mg | DELAYED_RELEASE_TABLET | Freq: Every day | ORAL | Status: DC
  Administered 2020-12-04 – 2020-12-07 (×4): 40 mg via ORAL
  Filled 2020-12-04 (×4): qty 1

## 2020-12-04 MED ORDER — HEPARIN SODIUM (PORCINE) 5000 UNIT/ML IJ SOLN
5000.0000 [IU] | Freq: Three times a day (TID) | INTRAMUSCULAR | Status: DC
  Administered 2020-12-04 – 2020-12-07 (×9): 5000 [IU] via SUBCUTANEOUS
  Filled 2020-12-04 (×9): qty 1

## 2020-12-04 MED ORDER — HYDRALAZINE HCL 20 MG/ML IJ SOLN: 5.0000 mg | INTRAMUSCULAR | Status: DC | PRN

## 2020-12-04 MED ORDER — LOSARTAN POTASSIUM 50 MG PO TABS
50.0000 mg | ORAL_TABLET | Freq: Every day | ORAL | Status: DC
  Administered 2020-12-04 – 2020-12-05 (×2): 50 mg via ORAL
  Filled 2020-12-04 (×2): qty 1

## 2020-12-04 MED ORDER — ENSURE SURGERY PO LIQD
237.0000 mL | Freq: Two times a day (BID) | ORAL | Status: DC
  Administered 2020-12-04 – 2020-12-07 (×7): 237 mL via ORAL
  Filled 2020-12-04 (×7): qty 237

## 2020-12-04 MED ORDER — BETHANECHOL CHLORIDE 10 MG PO TABS
10.0000 mg | ORAL_TABLET | Freq: Three times a day (TID) | ORAL | Status: AC
  Administered 2020-12-04 – 2020-12-06 (×9): 10 mg via ORAL
  Filled 2020-12-04 (×9): qty 1

## 2020-12-04 NOTE — Progress Notes (Signed)
Occupational Therapy Treatment Patient Details Name: Felicia Chen MRN: [id number] DOB: [birth year] Today's Date: 12/04/2020   History of present illness 55 yo female presents to [facility] on 9/14 with intense headache, R hemiplegia. CTH/MRI shows 60 cc left frontal parietal lobar hematoma with SAH extending over bilat cerebral hemispheres and cerebellum, L temporal lobe ischemic CVA. Pt also with covid-19. PMH includes anxiety, Gilbert's syndrome, obesity, HLD.   OT comments  Pt progressing towards OT goals this session. Very motivated. Continues with R visual deficits, R inattention, cognitive deficits including problem solving, attention. No tone noted today in RLE during multiple sit<>stands and pivot to recliner. More buckling in RLE today. CIR remains essential to maximize safety and independence in ADL and functional transfers.    Recommendations for follow up therapy are one component of a multi-disciplinary discharge planning process, led by the attending physician.  Recommendations may be updated based on patient status, additional functional criteria and insurance authorization.    Follow Up Recommendations  CIR    Equipment Recommendations  3 in 1 bedside commode;Tub/shower bench;Wheelchair (measurements OT);Wheelchair cushion (measurements OT)    Recommendations for Other Services Rehab consult    Precautions / Restrictions Precautions Precautions: Fall Precaution Comments: R hemi, inattention. SBP parameters 120-140 Restrictions Weight Bearing Restrictions: No       Mobility Bed Mobility Overal bed mobility: Needs Assistance Bed Mobility: Supine to Sit     Supine to sit: Mod assist;+2 for physical assistance;+2 for safety/equipment;HOB elevated     General bed mobility comments: assist for BLE off the bed (cues to attend to right side throughout), assist for trunk elevation and use of pad to bring hips out EOB. Pt trying so hard to help but with such trouble  problem solving that sometimes it was more of a hinderance. multimodal cues to correct    Transfers Overall transfer level: Needs assistance Equipment used: 2 person hand held assist (handle on back of recliner) Transfers: Sit to/from [facility] Sit to Stand: +2 physical assistance;Mod assist;+2 safety/equipment Stand pivot transfers: Max assist;+2 physical assistance;+2 safety/equipment       General transfer comment: rocking momentum used to cue Pt to get into standing. Blocking of R knee to prevent buckling today. initial Right lateral lean able to correct with assist.    Balance Overall balance assessment: Needs assistance Sitting-balance support: Single extremity supported Sitting balance-Leahy Scale: Poor Sitting balance - Comments: leans R when has something to push into, can sit with min A to CGA when static and positioned, loses balance to R with functional tasks in sitting Postural control: Right lateral lean Standing balance support: Single extremity supported Standing balance-Leahy Scale: Poor Standing balance comment: sit<>stand x3 from EOB with RLE showing decreased control with each stand requiring blocking at knee and repositioning of foot for stand.  pt stood for and performed some lateral weight shifts wtih A of 2, and then finally pivot to chair with max A.                           ADL either performed or assessed with clinical judgement   ADL Overall ADL's : Needs assistance/impaired                     Lower Body Dressing: Total assistance;Bed level Lower Body Dressing Details (indicate cue type and reason): to don socks       Toileting - Clothing Manipulation Details (indicate cue type and  reason): purewick in place at beginning and end of session     Functional mobility during ADLs: Maximal assistance;+2 for physical assistance General ADL Comments: affected by hemiplegia in addition to apraxia and visual deficits      Vision   Vision Assessment?: Vision impaired- to be further tested in functional context   Perception     Praxis      Cognition Arousal/Alertness: Awake/alert Behavior During Therapy: Impulsive Overall Cognitive Status: Impaired/Different from baseline Area of Impairment: Attention;Safety/judgement;Problem solving;Awareness                   Current Attention Level: Sustained   Following Commands: Follows one step commands with increased time;Follows one step commands inconsistently (and multimodal cues) Safety/Judgement: Decreased awareness of safety;Decreased awareness of deficits Awareness: Emergent Problem Solving: Slow processing;Difficulty sequencing;Requires verbal cues;Requires tactile cues General Comments: Demonstrates limited insight into deficits, emotional at times; attention affecting postural control while sitting EOB, requires multimodal cues for problem solving        Exercises     Shoulder Instructions       General Comments      Pertinent Vitals/ Pain       Pain Assessment: No/denies pain Faces Pain Scale: No hurt  Home Living                                          Prior Functioning/Environment              Frequency  Min 2X/week        Progress Toward Goals  OT Goals(current goals can now be found in the care plan section)  Progress towards OT goals: Progressing toward goals  Acute Rehab OT Goals Patient Stated Goal: get better OT Goal Formulation: With patient/family Time For Goal Achievement: 12/16/20 Potential to Achieve Goals: Good ADL Goals Pt Will Perform Eating: with modified independence Pt Will Perform Grooming: with set-up;sitting Pt Will Perform Upper Body Bathing: with set-up;sitting Pt Will Perform Lower Body Bathing: with mod assist;sit to/from stand Pt Will Transfer to Toilet: squat pivot transfer;bedside commode;with mod assist Additional ADL Goal #1: Pt will maintain midline  postural control EOB with S in preparation for ADL tasks  Plan Discharge plan remains appropriate;Frequency remains appropriate    Co-evaluation    PT/OT/SLP Co-Evaluation/Treatment: Yes Reason for Co-Treatment: Necessary to address cognition/behavior during functional activity;For patient/therapist safety;To address functional/ADL transfers PT goals addressed during session: Mobility/safety with mobility;Balance;Strengthening/ROM OT goals addressed during session: ADL's and self-care;Strengthening/ROM      AM-PAC OT "6 Clicks" Daily Activity     Outcome Measure   Help from another person eating meals?: A Lot Help from another person taking care of personal grooming?: A Lot Help from another person toileting, which includes using toliet, bedpan, or urinal?: Total Help from another person bathing (including washing, rinsing, drying)?: A Lot Help from another person to put on and taking off regular upper body clothing?: A Lot Help from another person to put on and taking off regular lower body clothing?: Total 6 Click Score: 10    End of Session Equipment Utilized During Treatment: Gait belt;Oxygen  OT Visit Diagnosis: Other abnormalities of gait and mobility (R26.89);Muscle weakness (generalized) (M62.81);Apraxia (R48.2);Other symptoms and signs involving cognitive function;Other symptoms and signs involving the nervous system (R29.898);Hemiplegia and hemiparesis Hemiplegia - Right/Left: Right Hemiplegia - dominant/non-dominant: Dominant Hemiplegia - caused by: Nontraumatic SAH;Other Nontraumatic  intracranial hemorrhage;Cerebral infarction   Activity Tolerance Patient tolerated treatment well   Patient Left with call bell/phone within reach;with family/visitor present;Other (comment);in chair;with chair alarm set (chair position)   Nurse Communication Mobility status        Time: [phone] OT Time Calculation (min): 34 min  Charges: OT General Charges $OT Visit: 1  Visit OT Treatments $Self Care/Home Management : 8-22 mins  Jesse Sans OTR/L Acute Rehabilitation Services Pager: [phone] Office: [place] 12/04/2020, 5:28 PM

## 2020-12-04 NOTE — Progress Notes (Signed)
Pharmacy Electrolyte Replacement  Recent Labs:  Recent Labs    12/03/20 0751 12/04/20 0016  K 3.8 3.4*  MG 2.1  --   CREATININE 0.62 0.63    Low Critical Values (K </= 2.5, Phos </= 1, Mg </= 1) Present: None  Plan:  - K 3.4 - supplement KCl 40 mEq po x 1 - F/u K with AM labs  Thank you for allowing pharmacy to be a part of this patient's care.  Alycia Rossetti, PharmD, BCPS Clinical Pharmacist Clinical phone for 12/04/2020: [phone] 12/04/2020 7:19 AM   **Pharmacist phone directory can now be found on [email or web address] ([email or web address]).  Listed under [place].

## 2020-12-04 NOTE — Progress Notes (Signed)
NAME:  Felicia Chen, MRN:  [id number], DOB:  02/14/1966, LOS: 3 ADMISSION DATE:  12/01/2020, CONSULTATION DATE: 11/21/2020 REFERRING MD: Dr. Leonie Man, CHIEF COMPLAINT: Headache and shortness of breath  History of Present Illness:  55 year old female with hyperlipidemia and obesity was brought in the emergency department with increasing headache and right-sided neglect with weakness.  Stroke code was called, patient was noted to have left frontal parietal intraparenchymal hemorrhage with acute ischemic stroke of left temporal region.  Patient was diagnosed with COVID few days ago, now with increasing shortness of breath requiring oxygen, PCCM was consulted for help with management. Patient continued complain of headache and shortness of breath, denies fever, chills, nausea vomiting, dysuria, urgency, frequency or other complaints  Significant Hospital Events: Including procedures, antibiotic start and stop dates in addition to other pertinent events   9/14 admitted under stroke service, PCCM was consulted  Interim History / Subjective:  Had a good night's sleep. Anxiety and confusion MUCH better today. Breathing stable, oxygen saturations improved.   Objective   Blood pressure (!) 149/89, pulse 82, temperature 100 F (37.8 C), temperature source Oral, resp. rate 15, height '5\' 8"'$  (1.727 m), weight 117.9 kg, SpO2 96 %.        Intake/Output Summary (Last 24 hours) at 12/04/2020 1100 Last data filed at 12/04/2020 0800 Gross per 24 hour  Intake 685.69 ml  Output 2090 ml  Net -1404.31 ml   Filed Weights   12/01/20 0703  Weight: 117.9 kg    Examination:   Physical exam: General: sitting in bed, resting comfortably,  HEENT: mmm Neuro: right sided hemiplegia, normal speech, AOx4 Chest: no increased wob, no wheezes or crackles. On nasal cannula. Able to speak in full setences Heart: RRR no mrg Abdomen: soft, nt, nd Skin: No rash Psych: normal affect and mod  Resolved Hospital  Problem list     Assessment & Plan:  Acute left frontoparietal intraparenchymal hemorrhage with associated subarachnoid hemorrhage and cerebral brain edema, ICH score of 1, could be related to her CVS Mild encephalopathy possible related to cerebral edema Acute hypoxic respiratory failure due to COVID-19 pneumonia Morbid obesity Hyperlipidemia Hypertension   PT/OT  evaluation. May be appropriate for CIR Closely monitor blood pressure with SBP goal 120-140. Off clevidipine now.  MRI and MRA brain ruled out cortical vein thrombosis, confirmed left-sided frontoparietal hemorrhage Taper off HTS today. Anxiety and confusion much improved.  Continue oxygen via nasal cannula. Goal saturations over 90%.  Continue contact and airborne isolation Continue remdesivir and dexamethasone for total course Continue atorvastatin Avoid antiplatelet agent   Best Practice (right click and "Reselect all SmartList Selections" daily)   Diet/type: Heart healthy diet DVT prophylaxis: SCD GI prophylaxis: N/A Lines: N/A Foley:  N/A Code Status:  full code Last date of multidisciplinary goals of care discussion [9/17: Patient and husband have been updated at bedside]  [facility] for transfer to [facility] today. PCCM will see as needed.   [facility] 12/04/2020 11:00 AM  Pager: see AMION  If no response to pager , please call critical care on call (see AMION) until 7pm After 7:00 pm call Elink     Labs   CBC: Recent Labs  Lab 12/01/20 0545 12/01/20 0552 12/03/20 0751 12/04/20 0016  WBC 7.1  --  10.6* 10.8*  NEUTROABS 4.2  --   --   --   HGB 15.2* 15.3* 13.6 12.8  HCT 44.1 45.0 39.9 37.7  MCV 89.6  --  91.3 91.5  PLT 285  --  289 [id number]    Basic Metabolic Panel: Recent Labs  Lab 12/01/20 0545 12/01/20 0552 12/01/20 1434 12/03/20 0143 12/03/20 0751 12/03/20 1154 12/03/20 1746 12/04/20 0016  NA 138 140   < > 144 145 143 144 144  143  K 3.4* 3.7   --   --  3.8  --   --  3.4*  CL 99 103  --   --  114*  --   --  111  CO2 23  --   --   --  25  --   --  24  GLUCOSE 130* 132*  --   --  125*  --   --  97  BUN 14 20  --   --  13  --   --  14  CREATININE 0.71 0.60  --   --  0.62  --   --  0.63  CALCIUM 9.5  --   --   --  8.5*  --   --  8.5*  MG  --   --   --   --  2.1  --   --   --    < > = values in this interval not displayed.   GFR: Estimated Creatinine Clearance: 106 mL/min (by C-G formula based on SCr of 0.63 mg/dL). Recent Labs  Lab 12/01/20 0545 12/03/20 0751 12/04/20 0016  WBC 7.1 10.6* 10.8*    Liver Function Tests: Recent Labs  Lab 12/01/20 0545 12/03/20 0751  AST 29 26  ALT 41 34  ALKPHOS 54 38  BILITOT 1.5* 1.5*  PROT 6.8 6.0*  ALBUMIN 3.8 3.5   No results for input(s): LIPASE, AMYLASE in the last 168 hours. No results for input(s): AMMONIA in the last 168 hours.  ABG    Component Value Date/Time   TCO2 27 12/01/2020 0552     Coagulation Profile: Recent Labs  Lab 12/01/20 0545  INR 1.0    Cardiac Enzymes: No results for input(s): CKTOTAL, CKMB, CKMBINDEX, TROPONINI in the last 168 hours.  HbA1C: Hgb A1c MFr Bld  Date/Time Value Ref Range Status  12/01/2020 08:32 AM 5.4 4.8 - 5.6 % Final    Comment:    (NOTE) Pre diabetes:          5.7%-6.4%  Diabetes:              >6.4%  Glycemic control for   <7.0% adults with diabetes     CBG: Recent Labs  Lab 12/01/20 0545  GLUCAP 126*

## 2020-12-04 NOTE — Progress Notes (Signed)
Physical Therapy Treatment Patient Details Name: Felicia Chen MRN: [id number] DOB: 1965-10-21 Today's Date: 12/04/2020   History of Present Illness 55 yo female presents to [facility] on 9/14 with intense headache, R hemiplegia. CTH/MRI shows 60 cc left frontal parietal lobar hematoma with SAH extending over bilat cerebral hemispheres and cerebellum, L temporal lobe ischemic CVA. Pt also with covid-19. PMH includes anxiety, Gilbert's syndrome, obesity, HLD.    PT Comments    Patient continues to progress towards physical therapy goals. Patient continues to be limited by R inattention, R visual field deficits, R hemi, and impaired cognition. Requires multimodal cueing to process commands at times and follow directions. Patient performed sit to stand x 3 from EOB with R knee blocked and maxA+2 to pivot towards L to recliner. Patient with difficulty problem solving throughout session.  Continue to recommend comprehensive inpatient rehab (CIR) for post-acute therapy needs.    Recommendations for follow up therapy are one component of a multi-disciplinary discharge planning process, led by the attending physician.  Recommendations may be updated based on patient status, additional functional criteria and insurance authorization.  Follow Up Recommendations  CIR     Equipment Recommendations  3in1 (PT);Wheelchair (measurements PT);Wheelchair cushion (measurements PT)    Recommendations for Other Services       Precautions / Restrictions Precautions Precautions: Fall Precaution Comments: R hemi, inattention. SBP parameters 120-140 Restrictions Weight Bearing Restrictions: No     Mobility  Bed Mobility Overal bed mobility: Needs Assistance Bed Mobility: Supine to Sit     Supine to sit: Mod assist;+2 for physical assistance;+2 for safety/equipment;HOB elevated     General bed mobility comments: assist for BLE off the bed (cues to attend to right side throughout), assist for trunk  elevation and use of pad to bring hips out EOB. Pt trying so hard to help but with such trouble problem solving that sometimes it was more of a hinderance. multimodal cues to correct    Transfers Overall transfer level: Needs assistance Equipment used: 2 person hand held assist Transfers: Sit to/from [facility] Sit to Stand: Mod assist;+2 physical assistance;+2 safety/equipment Stand pivot transfers: Max assist;+2 physical assistance;+2 safety/equipment       General transfer comment: rocking momentum used to cue Pt to get into standing. Blocking of R knee to prevent buckling today. initial Right lateral lean able to correct with assist.  Ambulation/Gait                 Stairs             Wheelchair Mobility    Modified Rankin (Stroke Patients Only) Modified Rankin (Stroke Patients Only) Pre-Morbid Rankin Score: No symptoms Modified Rankin: Severe disability     Balance Overall balance assessment: Needs assistance Sitting-balance support: Single extremity supported Sitting balance-Leahy Scale: Poor Sitting balance - Comments: leans R when has something to push into, can sit with min A to CGA when static and positioned, loses balance to R with functional tasks in sitting Postural control: Right lateral lean Standing balance support: Single extremity supported Standing balance-Leahy Scale: Poor Standing balance comment: sit<>stand x3 from EOB with RLE showing decreased control with each stand requiring blocking at knee and repositioning of foot for stand.  pt stood for and performed some lateral weight shifts wtih A of 2, and then finally pivot to chair with max A.  Cognition Arousal/Alertness: Awake/alert Behavior During Therapy: Impulsive Overall Cognitive Status: Impaired/Different from baseline Area of Impairment: Attention;Safety/judgement;Problem solving;Awareness                   Current  Attention Level: Sustained   Following Commands: Follows one step commands with increased time;Follows one step commands inconsistently Safety/Judgement: Decreased awareness of safety;Decreased awareness of deficits Awareness: Emergent Problem Solving: Slow processing;Difficulty sequencing;Requires verbal cues;Requires tactile cues General Comments: Demonstrates limited insight into deficits, emotional at times; attention affecting postural control while sitting EOB, requires multimodal cues for problem solving      Exercises      General Comments        Pertinent Vitals/Pain Pain Assessment: No/denies pain Faces Pain Scale: No hurt    Home Living                      Prior Function            PT Goals (current goals can now be found in the care plan section) Acute Rehab PT Goals Patient Stated Goal: get better PT Goal Formulation: With patient/family Time For Goal Achievement: 12/16/20 Potential to Achieve Goals: Good Progress towards PT goals: Progressing toward goals    Frequency    Min 4X/week      PT Plan Current plan remains appropriate    Co-evaluation PT/OT/SLP Co-Evaluation/Treatment: Yes Reason for Co-Treatment: Necessary to address cognition/behavior during functional activity;For patient/therapist safety;To address functional/ADL transfers PT goals addressed during session: Mobility/safety with mobility;Balance;Strengthening/ROM OT goals addressed during session: ADL's and self-care;Strengthening/ROM      AM-PAC PT "6 Clicks" Mobility   Outcome Measure  Help needed turning from your back to your side while in a flat bed without using bedrails?: A Lot Help needed moving from lying on your back to sitting on the side of a flat bed without using bedrails?: Total Help needed moving to and from a bed to a chair (including a wheelchair)?: Total Help needed standing up from a chair using your arms (e.g., wheelchair or bedside chair)?:  Total Help needed to walk in hospital room?: Total Help needed climbing 3-5 steps with a railing? : Total 6 Click Score: 7    End of Session Equipment Utilized During Treatment: Gait belt Activity Tolerance: Patient tolerated treatment well Patient left: in chair;with call bell/phone within reach;with chair alarm set Nurse Communication: Mobility status;Need for lift equipment PT Visit Diagnosis: Hemiplegia and hemiparesis;Other symptoms and signs involving the nervous system (R29.898);Other abnormalities of gait and mobility (R26.89) Hemiplegia - Right/Left: Right Hemiplegia - dominant/non-dominant: Dominant Hemiplegia - caused by: Nontraumatic intracerebral hemorrhage;Cerebral infarction     Time: [id number] PT Time Calculation (min) (ACUTE ONLY): 34 min  Charges:  $Neuromuscular Re-education: 8-22 mins                     Naheim Burgen A. Gilford Rile PT, DPT Acute Rehabilitation Services Pager [phone] Office [phone]    Linna Hoff 12/04/2020, 5:50 PM

## 2020-12-04 NOTE — Progress Notes (Signed)
STROKE TEAM PROGRESS NOTE  INTERVAL HISTORY Patient lying in bed, PT/OT also in room. Pt awake alert, conversing well, still has right hemiplegia. Off 3% saline, doing well. On diet, no appetite, will give ensure for diet supplement. BP stable on the high end, will start losartan.   Vitals:   12/04/20 1121 12/04/20 1200 12/04/20 1300 12/04/20 1400  BP:  (!) 149/84 (!) 148/84 (!) 147/75  Pulse: 78 76 78 83  Resp: 18 (!) 21 (!) 21 (!) 22  Temp:  99 F (37.2 C)    TempSrc:  Oral    SpO2: 96% 91% 97% 93%  Weight:      Height:       CBC:  Recent Labs  Lab 12/01/20 0545 12/01/20 0552 12/03/20 0751 12/04/20 0016  WBC 7.1  --  10.6* 10.8*  NEUTROABS 4.2  --   --   --   HGB 15.2*   < > 13.6 12.8  HCT 44.1   < > 39.9 37.7  MCV 89.6  --  91.3 91.5  PLT 285  --  289 282   < > = values in this interval not displayed.   Basic Metabolic Panel:  Recent Labs  Lab 12/03/20 0751 12/03/20 1154 12/03/20 1746 12/04/20 0016  NA 145   < > 144 144  143  K 3.8  --   --  3.4*  CL 114*  --   --  111  CO2 25  --   --  24  GLUCOSE 125*  --   --  97  BUN 13  --   --  14  CREATININE 0.62  --   --  0.63  CALCIUM 8.5*  --   --  8.5*  MG 2.1  --   --   --    < > = values in this interval not displayed.    Lipid Panel:  Recent Labs  Lab 12/01/20 0832  CHOL 289*  TRIG 186*  HDL 40*  CHOLHDL 7.2  VLDL 37  LDLCALC 212*    HgbA1c:  Recent Labs  Lab 12/01/20 0832  HGBA1C 5.4   Urine Drug Screen:  Recent Labs  Lab 12/01/20 0819  LABOPIA NONE DETECTED  COCAINSCRNUR NONE DETECTED  LABBENZ NONE DETECTED  AMPHETMU NONE DETECTED  THCU NONE DETECTED  LABBARB NONE DETECTED    Alcohol Level  Recent Labs  Lab 12/01/20 0545  ETH <10    IMAGING past 24 hours No results found.  PHYSICAL EXAM:  Temp:  [98.7 F (37.1 C)-100 F (37.8 C)] 99 F (37.2 C) (09/17 1200) Pulse Rate:  [53-87] 83 (09/17 1400) Resp:  [12-30] 22 (09/17 1400) BP: (134-162)/(71-94) 147/75 (09/17  1400) SpO2:  [90 %-97 %] 93 % (09/17 1400)  General - Well nourished, well developed, in no apparent distress.  Ophthalmologic - fundi not visualized due to noncooperation.  Cardiovascular - Regular rhythm and rate.  Mental Status -  Level of arousal and orientation to time, place, and person were intact. Language including expression, naming, repetition, comprehension was assessed and found intact. Fund of Knowledge was assessed and was intact.  Cranial Nerves II - XII - II - Visual field intact OU but with right simultagnosia. III, IV, VI - Extraocular movements intact. V - Facial sensation intact bilaterally. VII - mild right nasolabial fold flattening VIII - Hearing & vestibular intact bilaterally. X - Palate elevates symmetrically. XI - Chin turning & shoulder shrug intact bilaterally. XII - Tongue protrusion intact.  Motor Strength - The patient's strength was normal in LUE and LLE, however, right UE 0/5 and RLE 2-/5.  Bulk was normal and fasciculations were absent.   Motor Tone - Muscle tone was assessed at the neck and appendages and was normal.  Reflexes - The patient's reflexes were symmetrical in all extremities and she had no pathological reflexes.  Sensory - Light touch, temperature/pinprick were assessed and were symmetrical.    Coordination - The patient had normal movements in the left hand with no ataxia or dysmetria.  Tremor was absent.  Gait and Station - deferred.   ASSESSMENT/PLAN Ms. ANTONIYA VORNDRAN is a 55 y.o. female with history of   significant for hyperlipidemia, obesity (BMI 39.5), anxiety presenting with confusion as well as headache in the setting of recent COVID infection. CT head showed 60 cc left frontal parietal lobar hematoma with regional subarachnoid extension and Low-density appearance of the left temporal lobe of indeterminate cause, as above. Initial concern for the latter was ischemic infarction, however, MRI fortunately did not show  evidence of acute ischemia.   ICH -  Left frontal lobe ICH with small SAH, etiology unclear, hypertension vs. small vessel event given multiple risk factors vs. ? RCVS   CT head 1. 60 cc left frontal parietal lobar hematoma with regiona subarachnoid extension. CTA head & neck  . No spot sign or discrete vascular lesion underlying the lobar hematoma. 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 CTV negative for CVST MRI  brain : no evidence temporal lobe ischemia as initially deszcribed on head CT. Continues to show stable size of left parietal IPH with subarachnoid extension overlying bilateral cerebral hemispheres are well as cerebellar regions. Mild edema, no midline shift.  MRV: no evidence CVST  2D Echo LVEF 60-65%, no regional wall abnormalities, LVH which is mild, grade I diastolic dysfunction TCDs no evidence of vasospasm  LDL 212 HgbA1c 5.4 VTE prophylaxis - heparin subq No antithrombotic prior to admission, now on No antithrombotic. Cerebral bleed  Therapy recommendations:  CIR  Disposition: for acute rehab after treatment for acute COVID infection     Hypertensive emergency Home meds:   none Off cleviprex Stable on the high end Put on losartan  BP <160  Long-term BP goal normotensive  Hyperlipidemia Home meds:   none  LDL 212, goal < 70 Add lipitor 80 High intensity statin   Continue statin at discharge  ? RCVS CTA head and neck - Scattered segmental narrowings, suspect RCVS or similar vasculopathy in this patient with headache. ? Possible RCVS triggered by Zoloft and COVID infection.   May Consider repeat CT angiogram in 1 week prior to discharge to look for resolution of cerebral vaso-constriction  COVID infection COVID infection, PCR positive Airborne isolation  On remdesivir and dexamethasone Aappreciate management per CCM   Anxiety Add Xanax 0.'5mg'$  TID scheduled DC IV Ativan  Other Stroke Risk  Factors  Obesity, Body mass index is 39.52 kg/m., BMI >/= 30 associated with increased stroke risk, recommend weight loss, diet and exercise as appropriate   Other Active Problems Depression: had been on zoloft '100mg'$  outpatient. This was discontinued due to suspicion RCVS and she was alternatively placed on buspar '5mg'$ . Can also use prn atarax, which is a home medication.  Have also added Xanax to BuSpar kicks in Headache: much improved and BP stabilizing, no role nimodipine   Hospital day # 3  This patient is critically ill due to large left  frontal ICH, SAH, hypertensive emergency,?  R CVS, COVID infection and at significant risk of neurological worsening, death form recurrent hemorrhage, cerebral edema, brain herniation, hypertensive encephalopathy, respiratory failure, sepsis, seizure. This patient's care requires constant monitoring of vital signs, hemodynamics, respiratory and cardiac monitoring, review of multiple databases, neurological assessment, discussion with family, other specialists and medical decision making of high complexity. I spent 40 minutes of neurocritical care time in the care of this patient.  Rosalin Hawking, MD PhD Stroke Neurology 12/04/2020 7:38 PM    To contact Stroke Continuity provider, please refer to [email or web address] After hours, contact General Neurology

## 2020-12-05 ENCOUNTER — Inpatient Hospital Stay (HOSPITAL_COMMUNITY): Payer: 59

## 2020-12-05 LAB — BASIC METABOLIC PANEL
Anion gap: 13 (ref 5–15)
BUN: 18 mg/dL (ref 6–20)
CO2: 21 mmol/L — ABNORMAL LOW (ref 22–32)
Calcium: 8.7 mg/dL — ABNORMAL LOW (ref 8.9–10.3)
Chloride: 105 mmol/L (ref 98–111)
Creatinine, Ser: 0.69 mg/dL (ref 0.44–1.00)
GFR, Estimated: >60 mL/min (ref >60)
Glucose, Bld: 90 mg/dL (ref 70–99)
Potassium: 4.9 mmol/L (ref 3.5–5.1)
Sodium: 139 mmol/L (ref 135–145)

## 2020-12-05 LAB — CBC WITH DIFFERENTIAL/PLATELET
Abs Immature Granulocytes: 0.14 10*3/uL — ABNORMAL HIGH (ref 0.00–0.07)
Basophils Absolute: 0.1 10*3/uL (ref 0.0–0.1)
Basophils Relative: 1 %
Eosinophils Absolute: 0.1 10*3/uL (ref 0.0–0.5)
Eosinophils Relative: 1 %
HCT: 41.1 % (ref 36.0–46.0)
Hemoglobin: 14 g/dL (ref 12.0–15.0)
Immature Granulocytes: 1 %
Lymphocytes Relative: 35 %
Lymphs Abs: 4 10*3/uL (ref 0.7–4.0)
MCH: 30.7 pg (ref 26.0–34.0)
MCHC: 34.1 g/dL (ref 30.0–36.0)
MCV: 90.1 fL (ref 80.0–100.0)
Monocytes Absolute: 0.9 10*3/uL (ref 0.1–1.0)
Monocytes Relative: 8 %
Neutro Abs: 6.3 10*3/uL (ref 1.7–7.7)
Neutrophils Relative %: 54 %
Platelets: 326 10*3/uL (ref 150–400)
RBC: 4.56 MIL/uL (ref 3.87–5.11)
RDW: 12.6 % (ref 11.5–15.5)
WBC: 11.5 10*3/uL — ABNORMAL HIGH (ref 4.0–10.5)
nRBC: 0 % (ref 0.0–0.2)

## 2020-12-05 MED ORDER — AMLODIPINE BESYLATE 5 MG PO TABS
5.0000 mg | ORAL_TABLET | Freq: Every day | ORAL | Status: DC
  Administered 2020-12-05 – 2020-12-07 (×3): 5 mg via ORAL
  Filled 2020-12-05 (×2): qty 1

## 2020-12-05 MED ORDER — LOSARTAN POTASSIUM 50 MG PO TABS
50.0000 mg | ORAL_TABLET | Freq: Two times a day (BID) | ORAL | Status: DC
  Administered 2020-12-05 – 2020-12-07 (×4): 50 mg via ORAL
  Filled 2020-12-05 (×4): qty 1

## 2020-12-05 NOTE — Progress Notes (Signed)
PROGRESS NOTE   Felicia Chen  [id number] DOB: [birth year] DOA: 12/01/2020 PCP: Default, Provider, MD  Brief Narrative:  10 home dwelling f HLD BMI 39.5 moderate obesity anxiety Gilbert syndrome endometriosis status post TAH/BSO 2014 Recent diagnosis COVID-19 Admit code stroke 12/01/2020-diagnosed left frontal parietal intraparenchymal hemorrhage + ischemic stroke left temporal region ubarachnoid hemorrhage cerebral edema [?  Sinus thrombosis related to COVID?] Dense right hemiplegia noted hemineglect right-sided visual loss, ICH score of 1 Also requiring 2 L of oxygen critical care consulted  9/14 hypertonic saline started, Cleviprex drip started, given remdesivir dexamethasone since 9/14 9/15 Cleviprex drip stopped 9/16 hypertonic saline stopped  4 L nasal cannula T-max 99.1 151/80   Hospital-Problem based course  Accidental fall with mild periorbital hematoma right side Fall occurred in setting of ambulation Assessed and do not think there is high risk for any further intracranial pathology and CT head performed stat was negative for any intracranial bleed Continue monitoring and therapy services to continue to assess patient Left frontoparietal intracranial hemorrhage + ischemic stroke-?  Secondary to sinus thrombosis from COVID although ruled out on MRI brain No current antiplatelet or blood thinner because of intracranial bleed recently Strict blood pressure control for BP with goal below 160 over next several days Statin etc. as per neurology Needs CT angio to look for resolution of cerebral vasoconstriction contraction Hypertension with goal less than 160 Continue Cozaar 50 daily Augment with Norvasc '5mg'$  still somewhat uncontrolled  as needed labetalol every 2 as needed above 1/61 choice and hydralazine every 4 as needed second choice blood pressure above 160 Leukocytosis is secondary to steroids probably No work-up at this time Hypoxic respiratory failure on  admission Coronavirus 19 infection-mild Probable OSA given habitus and desaturations when sleeping Complete remdesivir 9/18, complete Decadron 9/23 Wean oxygen up in chair and if worsening oxygenation would resume checking COVID labs Will need CIR placement once outside of infectious window--- symptom onset was 11/26/2020--so 10 days out would be 9/18 and can probably come off precautions 9/19 a.m. Will need outpatient bilevel sleep study to rule out OSA if the patient drops her sats at night Anxiety depression Was taking hydroxyzine 25 4 times daily and sertraline 100 daily at home on BuSpar 5 twice daily and Xanax    DVT prophylaxis: SCD Code Status: Full presumed Family Communication: Husband [phone] discussed with at bedside Disposition:  Status is: Inpatient  Remains inpatient appropriate because:Persistent severe electrolyte disturbances  Dispo: The patient is from:  home              Anticipated d/c is to: CIR              Patient currently is not medically stable to d/c.   Difficult to place patient No    Consultants:  Neurology  Procedures: Multiple scans  Antimicrobials:   Remdesivir till 9/19   Subjective: Seen downstairs around 730 outside of CT suite and patient had no focal deficits-only had swelling over right eyebrow Reassessed at bedside at around 10 AM feeling somewhat sleepy and a little bit headache but no new focal deficits fully arousable Husband relates seems about the same in terms of level of function    Objective: Vitals:   12/05/20 0300 12/05/20 0400 12/05/20 0500 12/05/20 0600  BP: (!) 149/84 136/82 (!) 158/98 (!) 151/80  Pulse: 66 67 69 70  Resp: (!) '23 18 20 20  '$ Temp:  99.1 F (37.3 C)    TempSrc:  Oral    SpO2: Marland Kitchen)  88% 95% 97% 92%  Weight:      Height:        Intake/Output Summary (Last 24 hours) at 12/05/2020 0721 Last data filed at 12/05/2020 0300 Gross per 24 hour  Intake 1017 ml  Output 3025 ml  Net -2008 ml   Filed  Weights   12/01/20 0703  Weight: 117.9 kg    Examination:  Coherent pleasant no distress EOMI NCAT dense hemineglect on right side dense paralysis right upper extremity and right lower extremity sensory is intact reflexes poor exam because of no knee hand however not brisk Finger-nose-finger not tested external ocular movements intact smile symmetric shoulder shrug unilaterally performed on left side absent on the right S1-S2 no murmur no rub no gallop telemetry shows sinus, sinus bradycardia Chest clear no added sound no rales no rhonchi No lower extremity edema    Data Reviewed: personally reviewed   CBC    Component Value Date/Time   WBC 10.8 (H) 12/04/2020 0016   RBC 4.12 12/04/2020 0016   HGB 12.8 12/04/2020 0016   HCT 37.7 12/04/2020 0016   PLT 282 12/04/2020 0016   MCV 91.5 12/04/2020 0016   MCH 31.1 12/04/2020 0016   MCHC 34.0 12/04/2020 0016   RDW 13.0 12/04/2020 0016   LYMPHSABS 2.2 12/01/2020 0545   MONOABS 0.6 12/01/2020 0545   EOSABS 0.2 12/01/2020 0545   BASOSABS 0.0 12/01/2020 0545   CMP Latest Ref Rng & Units 12/04/2020 12/04/2020 12/03/2020  Glucose 70 - 99 mg/dL 97 - -  BUN 6 - 20 mg/dL 14 - -  Creatinine 0.44 - 1.00 mg/dL 0.63 - -  Sodium 135 - 145 mmol/L 144 143 144  Potassium 3.5 - 5.1 mmol/L 3.4(L) - -  Chloride 98 - 111 mmol/L 111 - -  CO2 22 - 32 mmol/L 24 - -  Calcium 8.9 - 10.3 mg/dL 8.5(L) - -  Total Protein 6.5 - 8.1 g/dL - - -  Total Bilirubin 0.3 - 1.2 mg/dL - - -  Alkaline Phos 38 - 126 U/L - - -  AST 15 - 41 U/L - - -  ALT 0 - 44 U/L - - -     Radiology Studies: No results found.   Scheduled Meds:  ALPRAZolam  0.5 mg Oral TID   atorvastatin  80 mg Oral Daily   bethanechol  10 mg Oral TID   busPIRone  5 mg Oral BID   Chlorhexidine Gluconate Cloth  6 each Topical Daily   dexamethasone (DECADRON) injection  6 mg Intravenous Daily   feeding supplement  237 mL Oral BID BM   heparin injection (subcutaneous)  5,000 Units  Subcutaneous Q8H   hydrOXYzine  25 mg Oral QID   losartan  50 mg Oral Daily   mouth rinse  15 mL Mouth Rinse BID   pantoprazole  40 mg Oral Daily   senna-docusate  1 tablet Oral BID   Continuous Infusions:  remdesivir 100 mg in NS 100 mL Stopped (12/04/20 1342)     LOS: 4 days   Time spent: [place], MD [facility] To contact the attending provider between 7A-7P or the covering provider during after hours 7P-7A, please log into the web site [email or web address] and access using universal [facility] password for that web site. If you do not have the password, please call the hospital operator.  12/05/2020, 7:21 AM

## 2020-12-05 NOTE — Progress Notes (Signed)
STROKE TEAM PROGRESS NOTE  INTERVAL HISTORY Husband and RN are at bedside.  Patient this morning was using bedside commode, had a fall, hitting head, now has right face bruises and eyelid swelling.  Stat CT no acute change of left brain hematoma and mild SAH.  BP still on the high end, will increase losartan to twice daily.  Vitals:   12/05/20 1300 12/05/20 1400 12/05/20 1500 12/05/20 1600  BP: (!) 141/76 (!) 158/91 (!) 169/90 (!) 168/93  Pulse: 89 92 95 95  Resp: '18 15 14 '$ (!) 21  Temp:    99.6 F (37.6 C)  TempSrc:    Oral  SpO2: 95% 95% 94% 97%  Weight:      Height:       CBC:  Recent Labs  Lab 12/01/20 0545 12/01/20 0552 12/04/20 0016 12/05/20 0628  WBC 7.1   < > 10.8* 11.5*  NEUTROABS 4.2  --   --  6.3  HGB 15.2*   < > 12.8 14.0  HCT 44.1   < > 37.7 41.1  MCV 89.6   < > 91.5 90.1  PLT 285   < > 282 326   < > = values in this interval not displayed.   Basic Metabolic Panel:  Recent Labs  Lab 12/03/20 0751 12/03/20 1154 12/04/20 0016 12/05/20 0545  NA 145   < > 144  143 139  K 3.8  --  3.4* 4.9  CL 114*  --  111 105  CO2 25  --  24 21*  GLUCOSE 125*  --  97 90  BUN 13  --  14 18  CREATININE 0.62  --  0.63 0.69  CALCIUM 8.5*  --  8.5* 8.7*  MG 2.1  --   --   --    < > = values in this interval not displayed.    Lipid Panel:  Recent Labs  Lab 12/01/20 0832  CHOL 289*  TRIG 186*  HDL 40*  CHOLHDL 7.2  VLDL 37  LDLCALC 212*    HgbA1c:  Recent Labs  Lab 12/01/20 0832  HGBA1C 5.4   Urine Drug Screen:  Recent Labs  Lab 12/01/20 0819  LABOPIA NONE DETECTED  COCAINSCRNUR NONE DETECTED  LABBENZ NONE DETECTED  AMPHETMU NONE DETECTED  THCU NONE DETECTED  LABBARB NONE DETECTED    Alcohol Level  Recent Labs  Lab 12/01/20 0545  ETH <10    IMAGING past 24 hours CT HEAD WO CONTRAST (5MM)  Result Date: 12/05/2020 CLINICAL DATA:  Cerebral hemorrhage suspected EXAM: CT HEAD WITHOUT CONTRAST TECHNIQUE: Contiguous axial images were obtained from the  base of the skull through the vertex without intravenous contrast. COMPARISON:  12/01/2020 FINDINGS: Brain: Size stable left cerebral hematoma centered at the parietal lobe with 5.3 cm craniocaudal span and nearly 3 cm width. Regional subarachnoid hemorrhage is stable to decreased. Expected vasogenic edema with local mass effect. No intraventricular hemorrhage. No visible infarct or hydrocephalus. Vascular: No hyperdense vessel or unexpected calcification. Skull: Normal. Negative for fracture or focal lesion. Sinuses/Orbits: No acute finding. Other: Soft tissue hematoma lateral to the right orbit. Patchy sinus opacification. IMPRESSION: No progression of the left cerebral hematoma with mild subarachnoid extension. No hydrocephalus or midline shift. Electronically Signed   By: Jorje Guild M.D.   On: 12/05/2020 08:18    PHYSICAL EXAM:  Temp:  [98.4 F (36.9 C)-99.6 F (37.6 C)] 99.6 F (37.6 C) (09/18 1600) Pulse Rate:  [62-95] 95 (09/18 1600) Resp:  [13-30] 21 (  09/18 1600) BP: (128-169)/(60-109) 168/93 (09/18 1600) SpO2:  [87 %-97 %] 97 % (09/18 1600)  General - Well nourished, well developed, in no apparent distress.  Ophthalmologic - fundi not visualized due to noncooperation.  Cardiovascular - Regular rhythm and rate.  Mental Status -  Level of arousal and orientation to time, place, and person were intact.  Right facial and eyelid bruises with swelling. Language including expression, naming, repetition, comprehension was assessed and found intact. Fund of Knowledge was assessed and was intact.  Cranial Nerves II - XII - II - Visual field intact OU but with right simultagnosia. III, IV, VI - Extraocular movements intact. V - Facial sensation intact bilaterally. VII - mild right nasolabial fold flattening VIII - Hearing & vestibular intact bilaterally. X - Palate elevates symmetrically. XI - Chin turning & shoulder shrug intact bilaterally. XII - Tongue protrusion  intact.  Motor Strength - The patient's strength was normal in LUE and LLE, however, right UE 0/5 and RLE 2-/5.  Bulk was normal and fasciculations were absent.   Motor Tone - Muscle tone was assessed at the neck and appendages and was normal.  Reflexes - The patient's reflexes were symmetrical in all extremities and she had no pathological reflexes.  Sensory - Light touch, temperature/pinprick were assessed and were symmetrical.    Coordination - The patient had normal movements in the left hand with no ataxia or dysmetria.  Tremor was absent.  Gait and Station - deferred.   ASSESSMENT/PLAN Ms. Felicia Chen is a 55 y.o. female with history of   significant for hyperlipidemia, obesity (BMI 39.5), anxiety presenting with confusion as well as headache in the setting of recent COVID infection. CT head showed 60 cc left frontal parietal lobar hematoma with regional subarachnoid extension and Low-density appearance of the left temporal lobe of indeterminate cause, as above. Initial concern for the latter was ischemic infarction, however, MRI fortunately did not show evidence of acute ischemia.   ICH -  Left frontal lobe ICH with small SAH, etiology unclear, hypertension vs. small vessel event given multiple risk factors vs. ? RCVS   CT head 1. 60 cc left frontal parietal lobar hematoma with regiona subarachnoid extension. CTA head & neck  . No spot sign or discrete vascular lesion underlying the lobar hematoma. 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 CTV negative for CVST MRI  brain : no evidence temporal lobe ischemia as initially deszcribed on head CT. Continues to show stable size of left parietal IPH with subarachnoid extension overlying bilateral cerebral hemispheres are well as cerebellar regions. Mild edema, no midline shift.  MRV: no evidence CVST  CT head 9/18 -stable hematoma and mild SAH 2D Echo LVEF 60-65%, no regional  wall abnormalities, LVH which is mild, grade I diastolic dysfunction TCDs no evidence of vasospasm  LDL 212 HgbA1c 5.4 VTE prophylaxis - heparin subq No antithrombotic prior to admission, now on No antithrombotic. Cerebral bleed  Therapy recommendations:  CIR  Disposition: for acute rehab after isolation for acute COVID infection     Hypertensive emergency Home meds:   none Off cleviprex Stable on the high end Put on losartan daily -> twice daily BP <160  Long-term BP goal normotensive  Hyperlipidemia Home meds:   none  LDL 212, goal < 70 Add lipitor 80 High intensity statin   Continue statin at discharge  ? RCVS vs. Intracranial stenosis given multiple risk factors CTA head and neck - Scattered segmental  narrowings, suspect RCVS or similar vasculopathy in this patient with headache. ? Possible RCVS triggered by Zoloft and COVID infection.   May Consider repeat CTA in 2 weeks to re-evaluate cerebral vaso-constriction vs. Atherosclerotic stenosis  COVID infection COVID infection, PCR positive Airborne isolation  On remdesivir and dexamethasone Aappreciate management per CCM   Anxiety Add Xanax 0.'5mg'$  TID scheduled DC IV Ativan  Other Stroke Risk Factors  Obesity, Body mass index is 39.52 kg/m., BMI >/= 30 associated with increased stroke risk, recommend weight loss, diet and exercise as appropriate   Other Active Problems Depression: had been on zoloft '100mg'$  outpatient. This was discontinued due to suspicion RCVS and she was alternatively placed on buspar '5mg'$ . Can also use prn atarax, which is a home medication.  Have also added Xanax to BuSpar kicks in Headache: much improved and BP stabilizing, no role nimodipine  Fall 9/18 while on bedside commode - right facial and eyelid bruise and edema - CT no change  Hospital day # 4  Neurology will sign off. Please call with questions. Pt will follow up with stroke clinic NP at [facility] in about 4 weeks. Thanks for the  consult.   Rosalin Hawking, MD PhD Stroke Neurology 12/05/2020 5:36 PM    To contact Stroke Continuity provider, please refer to [email or web address] After hours, contact General Neurology

## 2020-12-05 NOTE — Progress Notes (Signed)
Patient through night having difficulty urinating and was I and O twice.  Wanted to use bathroom but I set up Bedside Commode.  Transferred patient to bedside commode and stood with her about 3 min and she said it would take a minute.  Husband and I began to clean up her bed because it was soiled.  I was behind the bedside commode when she fell forward off of commode landing on her hands and right eyebrow hitting base of bedside table.  Patient never lost rhythm, O2 sat remained normal, she never lost consciousness.   Left side weakness remained the same and I believe was the primary factor leading to fall.  Stat CT done and Hospitalist and Neuro MD aware.  Dr Verlon Au evaluated patient outside CT room and will complete further exam at bedside.

## 2020-12-06 LAB — BASIC METABOLIC PANEL
Anion gap: 12 (ref 5–15)
BUN: 20 mg/dL (ref 6–20)
CO2: 25 mmol/L (ref 22–32)
Calcium: 8.9 mg/dL (ref 8.9–10.3)
Chloride: 100 mmol/L (ref 98–111)
Creatinine, Ser: 0.73 mg/dL (ref 0.44–1.00)
GFR, Estimated: >60 mL/min (ref >60)
Glucose, Bld: 105 mg/dL — ABNORMAL HIGH (ref 70–99)
Potassium: 3.6 mmol/L (ref 3.5–5.1)
Sodium: 137 mmol/L (ref 135–145)

## 2020-12-06 LAB — CBC
HCT: 43.1 % (ref 36.0–46.0)
Hemoglobin: 14.4 g/dL (ref 12.0–15.0)
MCH: 30.4 pg (ref 26.0–34.0)
MCHC: 33.4 g/dL (ref 30.0–36.0)
MCV: 90.9 fL (ref 80.0–100.0)
Platelets: 379 10*3/uL (ref 150–400)
RBC: 4.74 MIL/uL (ref 3.87–5.11)
RDW: 12.5 % (ref 11.5–15.5)
WBC: 12.2 10*3/uL — ABNORMAL HIGH (ref 4.0–10.5)
nRBC: 0 % (ref 0.0–0.2)

## 2020-12-06 NOTE — Progress Notes (Signed)
Physical Therapy Treatment Patient Details Name: Felicia Chen MRN: [id number] DOB: September 10, 1965 Today's Date: 12/06/2020   History of Present Illness 55 yo female [facility] to [facility] on 9/14 with intense headache, R hemiplegia. CTH/MRI shows 60 cc left frontal parietal lobar hematoma with SAH extending over bilat cerebral hemispheres and cerebellum, L temporal lobe ischemic CVA. Pt also with covid-19. Fall off [facility] 9/18; stat CT  -No progression of the left cerebral hematoma with mild subarachnoid  extension. PMH includes anxiety, Gilbert's syndrome, obesity, HLD.    PT Comments    Pt seen by PT/OT today, making steady progress towards goals and remains very motivated. Pt came to EOB with mod A +2 and worked on maintaining midline sitting, progressed to min-guard with less R lean. Pt needing full support R side with sit>stand with R lean and R knee buckling, especially when stepping LLE. Continue to recommend CIR at d/c. PT will continue to follow.    Recommendations for follow up therapy are one component of a multi-disciplinary discharge planning process, led by the attending physician.  Recommendations may be updated based on patient status, additional functional criteria and insurance authorization.  Follow Up Recommendations  CIR     Equipment Recommendations  3in1 (PT);Wheelchair (measurements PT);Wheelchair cushion (measurements PT)    Recommendations for Other Services Rehab consult     Precautions / Restrictions Precautions Precautions: Fall Precaution Comments: R hemi, inattention. SBP parameters <160 per neuro Restrictions Weight Bearing Restrictions: No     Mobility  Bed Mobility Overal bed mobility: Needs Assistance Bed Mobility: Supine to Sit Rolling: Mod assist Sidelying to sit: Max assist Supine to sit: +2 for physical assistance;+2 for safety/equipment;HOB elevated;Max assist     General bed mobility comments: rolled R with supervision, needed mod A for prep  for L roll and execution. Max A +2 for LE's off EOB and elevation of trunk to upright    Transfers Overall transfer level: Needs assistance Equipment used: 2 person hand held assist Transfers: Sit to/from [facility] Sit to Stand: Mod assist;+2 physical assistance Stand pivot transfers: Max assist;+2 physical assistance       General transfer comment: needed full support R side due to R lean and buckling of R knee. Pt with very good effort to get self to chair, almost hopping on LLE but needed mod/ max A on R during this  Ambulation/Gait             General Gait Details: R knee buckles with L step   Stairs             Wheelchair Mobility    Modified Rankin (Stroke Patients Only) Modified Rankin (Stroke Patients Only) Pre-Morbid Rankin Score: No symptoms Modified Rankin: Severe disability     Balance Overall balance assessment: Needs assistance Sitting-balance support: Single extremity supported Sitting balance-Leahy Scale: Fair Sitting balance - Comments: worked on leaning to L and then recovering to R without overcorrecting. Worked EOB as well as in Psychologist, occupational. Postural control: Right lateral lean Standing balance support: Bilateral upper extremity supported;During functional activity Standing balance-Leahy Scale: Poor Standing balance comment: worked on wt shifting in standing with verbal and tactile cues. Needed full knee block on R.                            Cognition Arousal/Alertness: Awake/alert Behavior During Therapy: Impulsive Overall Cognitive Status: Impaired/Different from baseline Area of Impairment: Attention;Following commands;Safety/judgement;Problem solving;Awareness;Memory  Orientation Level: Disoriented to;Time Current Attention Level: Sustained   Following Commands: Follows one step commands inconsistently Safety/Judgement: Decreased awareness of safety;Decreased awareness of  deficits Awareness: Emergent Problem Solving: Slow processing;Decreased initiation;Difficulty sequencing;Requires verbal cues;Requires tactile cues General Comments: pt unable to remember all of her children's ages, was able to name them and where they live. Continued R inattention.      Exercises Other Exercises Other Exercises: hand over hand guidance of LUE to assist RUE in lifting 5x Other Exercises: began to work on LLE assisting RLE in an effective way    General Comments General comments (skin integrity, edema, etc.): pt mildly lethargic once in chair and c/o of nausea when sitting EOB. BP in supine 141/88, BP after session in recliner 99/66. HR in 70's, SpO2 93-95% on RA.      Pertinent Vitals/Pain Pain Assessment: Faces Faces Pain Scale: Hurts a little bit Pain Location: nausea Pain Descriptors / Indicators: Discomfort Pain Intervention(s): Limited activity within patient's tolerance;Monitored during session    Home Living                      Prior Function            PT Goals (current goals can now be found in the care plan section) Acute Rehab PT Goals Patient Stated Goal: get better PT Goal Formulation: With patient/family Time For Goal Achievement: 12/16/20 Potential to Achieve Goals: Good Progress towards PT goals: Progressing toward goals    Frequency    Min 4X/week      PT Plan Current plan remains appropriate    Co-evaluation PT/OT/SLP Co-Evaluation/Treatment: Yes Reason for Co-Treatment: Complexity of the patient's impairments (multi-system involvement);Necessary to address cognition/behavior during functional activity;For patient/therapist safety PT goals addressed during session: Mobility/safety with mobility;Balance;Strengthening/ROM OT goals addressed during session: ADL's and self-care;Strengthening/ROM      AM-PAC PT "6 Clicks" Mobility   Outcome Measure  Help needed turning from your back to your side while in a flat bed  without using bedrails?: A Lot Help needed moving from lying on your back to sitting on the side of a flat bed without using bedrails?: Total Help needed moving to and from a bed to a chair (including a wheelchair)?: Total Help needed standing up from a chair using your arms (e.g., wheelchair or bedside chair)?: Total Help needed to walk in hospital room?: Total Help needed climbing 3-5 steps with a railing? : Total 6 Click Score: 7    End of Session Equipment Utilized During Treatment: Gait belt Activity Tolerance: Patient tolerated treatment well Patient left: in chair;with call bell/phone within reach;with chair alarm set;with family/visitor present Nurse Communication: Mobility status;Need for lift equipment PT Visit Diagnosis: Hemiplegia and hemiparesis;Other symptoms and signs involving the nervous system (R29.898);Other abnormalities of gait and mobility (R26.89) Hemiplegia - Right/Left: Right Hemiplegia - dominant/non-dominant: Dominant Hemiplegia - caused by: Nontraumatic intracerebral hemorrhage;Cerebral infarction     Time: [id number] PT Time Calculation (min) (ACUTE ONLY): 37 min  Charges:  $Neuromuscular Re-education: 8-22 mins                     [place]  Pager [phone] Office [place] 12/06/2020, 1:22 PM

## 2020-12-06 NOTE — Progress Notes (Signed)
Occupational Therapy Treatment Patient Details Name: Felicia Chen MRN: [id number] DOB: [birth year] Today's Date: 12/06/2020   History of present illness 55 yo female presents to [facility] on 9/14 with intense headache, R hemiplegia. CTH/MRI shows 60 cc left frontal parietal lobar hematoma with SAH extending over bilat cerebral hemispheres and cerebellum, L temporal lobe ischemic CVA. Pt also with covid-19. Fall off [facility] 9/18; stat CT  -No progression of the left cerebral hematoma with mild subarachnoid  extension. PMH includes anxiety, Gilbert's syndrome, obesity, HLD.   OT comments  Pt is making steady progress toward goals. Able to progress to EOB with mod A +2 and maintain midline postural control with minguard assist. Decreased pushing noted on EOB. Pt does better with repetitive hand over hand assist to increase control of movement patterns rather than multistep verbal directions.  +2 Max A to stand step pivot to recliner. Completed grooming tasks in seated position. Apparent apraxia during mobility and ADL tasks affecting function. Excellent CIR candidate. Pt very motivated to become more independent with mobility and self care. Increased time talking with pt/pt's husband regarding progression of rehab after having a CVA. Will continue to follow acutely.  VSS on RA   Recommendations for follow up therapy are one component of a multi-disciplinary discharge planning process, led by the attending physician.  Recommendations may be updated based on patient status, additional functional criteria and insurance authorization.    Follow Up Recommendations  CIR    Equipment Recommendations  3 in 1 bedside commode;Tub/shower bench;Wheelchair (measurements OT);Wheelchair cushion (measurements OT)    Recommendations for Other Services Rehab consult    Precautions / Restrictions Precautions Precautions: Fall Precaution Comments: R hemi, inattention. SBP parameters <160 per neuro       Mobility  Bed Mobility Overal bed mobility: Needs Assistance     Sidelying to sit: Max assist       General bed mobility comments: Ablet o roll R with S; Mod A +2 to rool L;    Transfers Overall transfer level: Needs assistance   Transfers: Sit to/from [employer] Transfers Sit to Stand: Mod assist;+2 physical assistance Stand pivot transfers: Max assist;+2 physical assistance       General transfer comment: improved ability to stand and maintain midline control    Balance     Sitting balance-Leahy Scale: Fair       Standing balance-Leahy Scale: Poor                             ADL either performed or assessed with clinical judgement   ADL Overall ADL's : Needs assistance/impaired Eating/Feeding: Minimal assistance   Grooming: Moderate assistance;Sitting   Upper Body Bathing: Moderate assistance;Sitting                           Functional mobility during ADLs: Maximal assistance;+2 for physical assistance       Vision   Vision Assessment?: Vision impaired- to be further tested in functional context Additional Comments: Difficulty maintaining visual target in R field during pursuits   Perception     Praxis      Cognition Arousal/Alertness: Awake/alert Behavior During Therapy: Impulsive Overall Cognitive Status: Impaired/Different from baseline Area of Impairment: Attention;Following commands;Safety/judgement;Problem solving;Awareness                   Current Attention Level: Sustained   Following Commands: Follows one step commands inconsistently Safety/Judgement: Decreased  awareness of safety;Decreased awareness of deficits Awareness: Emergent Problem Solving: Slow processing;Decreased initiation;Difficulty sequencing;Requires verbal cues;Requires tactile cues General Comments: R/L discrimination deficits        Exercises Other Exercises Other Exercises: RUE PROM; encouraged husband to continue PROM adn positioning    Shoulder Instructions       General Comments Long conversation with husband after session regarding progression of rehab most likely taking months; active listening provided    Pertinent Vitals/ Pain       Pain Assessment: Faces Faces Pain Scale: Hurts a little bit Pain Location: nausea Pain Descriptors / Indicators: Discomfort Pain Intervention(s): Limited activity within patient's tolerance  Home Living                                          Prior Functioning/Environment              Frequency  Min 2X/week        Progress Toward Goals  OT Goals(current goals can now be found in the care plan section)  Progress towards OT goals: Progressing toward goals  Acute Rehab OT Goals Patient Stated Goal: get better OT Goal Formulation: With patient/family Time For Goal Achievement: 12/16/20 Potential to Achieve Goals: Good ADL Goals Pt Will Perform Eating: with modified independence Pt Will Perform Grooming: with set-up;sitting Pt Will Perform Upper Body Bathing: with set-up;sitting Pt Will Perform Lower Body Bathing: with mod assist;sit to/from stand Pt Will Transfer to Toilet: squat pivot transfer;bedside commode;with mod assist Additional ADL Goal #1: Pt will maintain midline postural control EOB with S in preparation for ADL tasks  Plan Discharge plan remains appropriate;Frequency remains appropriate    Co-evaluation    PT/OT/SLP Co-Evaluation/Treatment: Yes Reason for Co-Treatment: For patient/therapist safety;To address functional/ADL transfers   OT goals addressed during session: ADL's and self-care;Strengthening/ROM      AM-PAC OT "6 Clicks" Daily Activity     Outcome Measure   Help from another person eating meals?: A Lot Help from another person taking care of personal grooming?: A Lot Help from another person toileting, which includes using toliet, bedpan, or urinal?: Total Help from another person bathing (including washing,  rinsing, drying)?: A Lot Help from another person to put on and taking off regular upper body clothing?: A Lot Help from another person to put on and taking off regular lower body clothing?: Total 6 Click Score: 10    End of Session Equipment Utilized During Treatment: Gait belt  OT Visit Diagnosis: Other abnormalities of gait and mobility (R26.89);Muscle weakness (generalized) (M62.81);Apraxia (R48.2);Other symptoms and signs involving cognitive function;Other symptoms and signs involving the nervous system (R29.898);Hemiplegia and hemiparesis Hemiplegia - Right/Left: Right Hemiplegia - dominant/non-dominant: Dominant Hemiplegia - caused by: Nontraumatic SAH;Other Nontraumatic intracranial hemorrhage;Cerebral infarction Pain - part of body:  (general discomfort form nausea)   Activity Tolerance Patient tolerated treatment well   Patient Left in chair;with call bell/phone within reach;with chair alarm set;with family/visitor present   Nurse Communication Mobility status;Need for lift equipment;Precautions        Time: 1000-1045 OT Time Calculation (min): 45 min  Charges: OT General Charges $OT Visit: 1 Visit OT Treatments $Self Care/Home Management : 8-22 mins $Therapeutic Exercise: 8-22 mins  Maurie Boettcher, OT/L   Acute OT Clinical Specialist Acute Rehabilitation Services Pager [phone] Office [phone]   [facility] 12/06/2020, 11:04 AM

## 2020-12-06 NOTE — Progress Notes (Signed)
Inpatient Rehabilitation Admissions Coordinator   Inpatient rehab consult received. I met with pt's spouse outside of room to discuss goals and expectations of a possible Cir admit. He prefers CIR admit. I discussed with Dr Verlon Au to clarify  when to d/c airborne precautions. Once airborne precautions discontinued, we can then begin process for possible CIR admit insurance Auth, etc.  Danne Baxter, RN, MSN Rehab Admissions Coordinator [phone] 12/06/2020 11:27 AM

## 2020-12-06 NOTE — Progress Notes (Signed)
PROGRESS NOTE   Felicia Chen  [id number] DOB: 07/17/1965 DOA: 12/01/2020 PCP: Default, Provider, MD  Brief Narrative:  4 home dwelling f HLD BMI 39.5 moderate obesity anxiety Gilbert syndrome endometriosis status post TAH/BSO 2014 Recent diagnosis COVID-19 Admit code stroke 12/01/2020-diagnosed left frontal parietal intraparenchymal hemorrhage + ischemic stroke left temporal region ubarachnoid hemorrhage cerebral edema [?  Sinus thrombosis related to COVID?] Dense right hemiplegia noted hemineglect right-sided visual loss, ICH score of 1 Also requiring 2 L of oxygen critical care consulted  9/14 hypertonic saline started, Cleviprex drip started, given remdesivir dexamethasone since 9/14 9/15 Cleviprex drip stopped 9/16 hypertonic saline stopped   Hospital-Problem based course  Accidental fall 9/18 with mild periorbital hematoma right side Fall occurred in setting of ambulation CT head performed stat was negative for any intracranial bleed Therapy re-eval and likely can discharge to CIR when stable Left frontoparietal intracranial hemorrhage + ischemic stroke-?  Secondary to sinus thrombosis from COVID although ruled out on MRI brain No antiplatelet or blood thinner because of intracranial bleed recently Strict blood pressure control for BP with goal below 160 over next several days Statin etc. as per neurology Needs CT angio  OCtober 3rd/4th to look for resolution of cerebral vasoconstriction contraction Hypertension with goal less than 160 Continue Cozaar 50 bid as per neuro Augment with Norvasc '5mg'$  still somewhat uncontrolled Prn labetalol every 2 as needed above 1/61 choice and hydralazine every 4 as needed second choice blood pressure above 160 Leukocytosis is secondary to steroids probably No work-up at this time Hypoxic respiratory failure on admission Coronavirus 19 infection-mild Probable OSA given habitus and desaturations when sleeping Complete remdesivir  9/18, complete Decadron 9/23 Wean oxygen up in chair and if worsening oxygenation would resume checking COVID labs Will need CIR placement once outside of infectious window--- symptom onset was 11/26/2020--so 10 days out would be 9/18  D/c precautions 9/19 a.m. Will need outpatient bilevel sleep study to rule out OSA if the patient drops her sats at night Anxiety depression PTA hydroxyzine 25 4 times daily and sertraline 100 daily at home on BuSpar 5 twice daily and Xanax    DVT prophylaxis: SCD Code Status: Full presumed Family Communication: Husband [phone] discussed with at bedside on several days Disposition:  Status is: Inpatient  Remains inpatient appropriate because:Persistent severe electrolyte disturbances  Dispo: The patient is from:  home              Anticipated d/c is to: CIR              Patient currently is not medically stable to d/c.   Difficult to place patient No    Consultants:  Neurology  Procedures: Multiple scans  Antimicrobials:   Remdesivir till 9/19   Subjective:  Awake coherent pleasant sitting up in chair very motivated Husband at bedside No chest pain no fever no chills no nausea no vomiting Moving left upper and lower extremity well still dense plegia to right side   Objective: Vitals:   12/06/20 0500 12/06/20 0600 12/06/20 0700 12/06/20 0800  BP: (!) 140/100 (!) 152/104 (!) 167/109 (!) 168/106  Pulse: 63 63 68 75  Resp: '19 18 20 11  '$ Temp:    99.3 F (37.4 C)  TempSrc:    Oral  SpO2: 96% 95% 95% 93%  Weight:      Height:        Intake/Output Summary (Last 24 hours) at 12/06/2020 1056 Last data filed at 12/06/2020 0600 Gross per 24 hour  Intake 100 ml  Output 2025 ml  Net -1925 ml    Filed Weights   12/01/20 0703  Weight: 117.9 kg    Examination:  Awake coherent no distress Bump on right periorbital area seems a little improved but has darkened expectedly Dense plegia on right side ROM intact on left good power  5/5 Some hemineglect on right Chest clear no added sound no rales no rhonchi S1-S2 no murmur no rub no gallop Psych euthymic pleasant coherent   Data Reviewed: personally reviewed   CBC    Component Value Date/Time   WBC 12.2 (H) 12/06/2020 0347   RBC 4.74 12/06/2020 0347   HGB 14.4 12/06/2020 0347   HCT 43.1 12/06/2020 0347   PLT 379 12/06/2020 0347   MCV 90.9 12/06/2020 0347   MCH 30.4 12/06/2020 0347   MCHC 33.4 12/06/2020 0347   RDW 12.5 12/06/2020 0347   LYMPHSABS 4.0 12/05/2020 0628   MONOABS 0.9 12/05/2020 0628   EOSABS 0.1 12/05/2020 0628   BASOSABS 0.1 12/05/2020 0628   CMP Latest Ref Rng & Units 12/06/2020 12/05/2020 12/04/2020  Glucose 70 - 99 mg/dL 105(H) 90 97  BUN 6 - 20 mg/dL '20 18 14  '$ Creatinine 0.44 - 1.00 mg/dL 0.73 0.69 0.63  Sodium 135 - 145 mmol/L 137 139 144  Potassium 3.5 - 5.1 mmol/L 3.6 4.9 3.4(L)  Chloride 98 - 111 mmol/L 100 105 111  CO2 22 - 32 mmol/L 25 21(L) 24  Calcium 8.9 - 10.3 mg/dL 8.9 8.7(L) 8.5(L)  Total Protein 6.5 - 8.1 g/dL - - -  Total Bilirubin 0.3 - 1.2 mg/dL - - -  Alkaline Phos 38 - 126 U/L - - -  AST 15 - 41 U/L - - -  ALT 0 - 44 U/L - - -     Radiology Studies: CT HEAD WO CONTRAST (5MM)  Result Date: 12/05/2020 CLINICAL DATA:  Cerebral hemorrhage suspected EXAM: CT HEAD WITHOUT CONTRAST TECHNIQUE: Contiguous axial images were obtained from the base of the skull through the vertex without intravenous contrast. COMPARISON:  12/01/2020 FINDINGS: Brain: Size stable left cerebral hematoma centered at the parietal lobe with 5.3 cm craniocaudal span and nearly 3 cm width. Regional subarachnoid hemorrhage is stable to decreased. Expected vasogenic edema with local mass effect. No intraventricular hemorrhage. No visible infarct or hydrocephalus. Vascular: No hyperdense vessel or unexpected calcification. Skull: Normal. Negative for fracture or focal lesion. Sinuses/Orbits: No acute finding. Other: Soft tissue hematoma lateral to the  right orbit. Patchy sinus opacification. IMPRESSION: No progression of the left cerebral hematoma with mild subarachnoid extension. No hydrocephalus or midline shift. Electronically Signed   By: Jorje Guild M.D.   On: 12/05/2020 08:18     Scheduled Meds:  ALPRAZolam  0.5 mg Oral TID   amLODipine  5 mg Oral Daily   atorvastatin  80 mg Oral Daily   bethanechol  10 mg Oral TID   busPIRone  5 mg Oral BID   Chlorhexidine Gluconate Cloth  6 each Topical Daily   dexamethasone (DECADRON) injection  6 mg Intravenous Daily   feeding supplement  237 mL Oral BID BM   heparin injection (subcutaneous)  5,000 Units Subcutaneous Q8H   hydrOXYzine  25 mg Oral QID   losartan  50 mg Oral BID   mouth rinse  15 mL Mouth Rinse BID   pantoprazole  40 mg Oral Daily   senna-docusate  1 tablet Oral BID   Continuous Infusions:     LOS: 5 days  Time spent: 75  Nita Sells, MD [facility] To contact the attending provider between 7A-7P or the covering provider during after hours 7P-7A, please log into the web site [email or web address] and access using universal [place] password for that web site. If you do not have the password, please call the hospital operator.  12/06/2020, 10:56 AM

## 2020-12-06 NOTE — Plan of Care (Signed)

## 2020-12-07 ENCOUNTER — Other Ambulatory Visit: Payer: Self-pay

## 2020-12-07 ENCOUNTER — Encounter (HOSPITAL_COMMUNITY): Payer: Self-pay | Admitting: Physical Medicine and Rehabilitation

## 2020-12-07 ENCOUNTER — Inpatient Hospital Stay (HOSPITAL_COMMUNITY)
Admission: RE | Admit: 2020-12-07 | Discharge: 2021-01-05 | DRG: 057 | Disposition: A | Discharge status: Home Health | Payer: 59 | Source: Intra-hospital | Attending: Physical Medicine and Rehabilitation | Admitting: Physical Medicine and Rehabilitation

## 2020-12-07 DIAGNOSIS — Z8679 Personal history of other diseases of the circulatory system: Secondary | ICD-10-CM

## 2020-12-07 DIAGNOSIS — E669 Obesity, unspecified: Secondary | ICD-10-CM | POA: Diagnosis present

## 2020-12-07 DIAGNOSIS — D72828 Other elevated white blood cell count: Secondary | ICD-10-CM | POA: Diagnosis not present

## 2020-12-07 DIAGNOSIS — I611 Nontraumatic intracerebral hemorrhage in hemisphere, cortical: Principal | ICD-10-CM

## 2020-12-07 DIAGNOSIS — I69120 Aphasia following nontraumatic intracerebral hemorrhage: Secondary | ICD-10-CM

## 2020-12-07 DIAGNOSIS — Z90710 Acquired absence of both cervix and uterus: Secondary | ICD-10-CM | POA: Diagnosis not present

## 2020-12-07 DIAGNOSIS — I69151 Hemiplegia and hemiparesis following nontraumatic intracerebral hemorrhage affecting right dominant side: Principal | ICD-10-CM

## 2020-12-07 DIAGNOSIS — D72829 Elevated white blood cell count, unspecified: Secondary | ICD-10-CM | POA: Diagnosis present

## 2020-12-07 DIAGNOSIS — I69190 Apraxia following nontraumatic intracerebral hemorrhage: Secondary | ICD-10-CM | POA: Diagnosis not present

## 2020-12-07 DIAGNOSIS — I619 Nontraumatic intracerebral hemorrhage, unspecified: Secondary | ICD-10-CM

## 2020-12-07 DIAGNOSIS — N319 Neuromuscular dysfunction of bladder, unspecified: Secondary | ICD-10-CM | POA: Diagnosis present

## 2020-12-07 DIAGNOSIS — Z79899 Other long term (current) drug therapy: Secondary | ICD-10-CM

## 2020-12-07 DIAGNOSIS — J029 Acute pharyngitis, unspecified: Secondary | ICD-10-CM | POA: Diagnosis not present

## 2020-12-07 DIAGNOSIS — I959 Hypotension, unspecified: Secondary | ICD-10-CM | POA: Diagnosis not present

## 2020-12-07 DIAGNOSIS — R0982 Postnasal drip: Secondary | ICD-10-CM | POA: Diagnosis not present

## 2020-12-07 DIAGNOSIS — I69051 Hemiplegia and hemiparesis following nontraumatic subarachnoid hemorrhage affecting right dominant side: Secondary | ICD-10-CM | POA: Diagnosis present

## 2020-12-07 DIAGNOSIS — T380X5A Adverse effect of glucocorticoids and synthetic analogues, initial encounter: Secondary | ICD-10-CM | POA: Diagnosis present

## 2020-12-07 DIAGNOSIS — I69020 Aphasia following nontraumatic subarachnoid hemorrhage: Secondary | ICD-10-CM

## 2020-12-07 DIAGNOSIS — F419 Anxiety disorder, unspecified: Secondary | ICD-10-CM | POA: Diagnosis present

## 2020-12-07 DIAGNOSIS — Y92239 Unspecified place in hospital as the place of occurrence of the external cause: Secondary | ICD-10-CM | POA: Diagnosis present

## 2020-12-07 DIAGNOSIS — Z90722 Acquired absence of ovaries, bilateral: Secondary | ICD-10-CM

## 2020-12-07 DIAGNOSIS — E785 Hyperlipidemia, unspecified: Secondary | ICD-10-CM | POA: Diagnosis present

## 2020-12-07 DIAGNOSIS — Z8616 Personal history of COVID-19: Secondary | ICD-10-CM | POA: Diagnosis not present

## 2020-12-07 DIAGNOSIS — Z9181 History of falling: Secondary | ICD-10-CM | POA: Diagnosis not present

## 2020-12-07 DIAGNOSIS — I69090 Apraxia following nontraumatic subarachnoid hemorrhage: Secondary | ICD-10-CM

## 2020-12-07 DIAGNOSIS — Z23 Encounter for immunization: Secondary | ICD-10-CM

## 2020-12-07 DIAGNOSIS — G8110 Spastic hemiplegia affecting unspecified side: Secondary | ICD-10-CM

## 2020-12-07 DIAGNOSIS — Z6834 Body mass index (BMI) 34.0-34.9, adult: Secondary | ICD-10-CM | POA: Diagnosis not present

## 2020-12-07 DIAGNOSIS — R339 Retention of urine, unspecified: Secondary | ICD-10-CM | POA: Diagnosis not present

## 2020-12-07 DIAGNOSIS — G903 Multi-system degeneration of the autonomic nervous system: Secondary | ICD-10-CM | POA: Diagnosis present

## 2020-12-07 DIAGNOSIS — R110 Nausea: Secondary | ICD-10-CM

## 2020-12-07 DIAGNOSIS — I612 Nontraumatic intracerebral hemorrhage in hemisphere, unspecified: Secondary | ICD-10-CM | POA: Diagnosis not present

## 2020-12-07 DIAGNOSIS — F32A Depression, unspecified: Secondary | ICD-10-CM | POA: Diagnosis present

## 2020-12-07 DIAGNOSIS — I10 Essential (primary) hypertension: Secondary | ICD-10-CM | POA: Diagnosis present

## 2020-12-07 DIAGNOSIS — G8191 Hemiplegia, unspecified affecting right dominant side: Secondary | ICD-10-CM

## 2020-12-07 DIAGNOSIS — R7401 Elevation of levels of liver transaminase levels: Secondary | ICD-10-CM | POA: Diagnosis present

## 2020-12-07 DIAGNOSIS — I610 Nontraumatic intracerebral hemorrhage in hemisphere, subcortical: Secondary | ICD-10-CM | POA: Diagnosis not present

## 2020-12-07 DIAGNOSIS — I69320 Aphasia following cerebral infarction: Secondary | ICD-10-CM | POA: Diagnosis not present

## 2020-12-07 LAB — CBC
HCT: 42 % (ref 36.0–46.0)
HCT: 43.6 % (ref 36.0–46.0)
Hemoglobin: 14.2 g/dL (ref 12.0–15.0)
Hemoglobin: 14.8 g/dL (ref 12.0–15.0)
MCH: 30.6 pg (ref 26.0–34.0)
MCH: 30.5 pg (ref 26.0–34.0)
MCHC: 33.8 g/dL (ref 30.0–36.0)
MCHC: 33.9 g/dL (ref 30.0–36.0)
MCV: 90.5 fL (ref 80.0–100.0)
MCV: 89.9 fL (ref 80.0–100.0)
Platelets: 360 10*3/uL (ref 150–400)
Platelets: 390 10*3/uL (ref 150–400)
RBC: 4.64 MIL/uL (ref 3.87–5.11)
RBC: 4.85 MIL/uL (ref 3.87–5.11)
RDW: 12.8 % (ref 11.5–15.5)
RDW: 12.8 % (ref 11.5–15.5)
WBC: 14.1 10*3/uL — ABNORMAL HIGH (ref 4.0–10.5)
WBC: 14.4 10*3/uL — ABNORMAL HIGH (ref 4.0–10.5)
nRBC: 0 % (ref 0.0–0.2)
nRBC: 0 % (ref 0.0–0.2)

## 2020-12-07 LAB — BASIC METABOLIC PANEL
Anion gap: 10 (ref 5–15)
BUN: 25 mg/dL — ABNORMAL HIGH (ref 6–20)
CO2: 26 mmol/L (ref 22–32)
Calcium: 9.3 mg/dL (ref 8.9–10.3)
Chloride: 102 mmol/L (ref 98–111)
Creatinine, Ser: 0.74 mg/dL (ref 0.44–1.00)
GFR, Estimated: >60 mL/min (ref >60)
Glucose, Bld: 109 mg/dL — ABNORMAL HIGH (ref 70–99)
Potassium: 3.7 mmol/L (ref 3.5–5.1)
Sodium: 138 mmol/L (ref 135–145)

## 2020-12-07 LAB — CREATININE, SERUM
Creatinine, Ser: 0.94 mg/dL (ref 0.44–1.00)
GFR, Estimated: >60 mL/min (ref >60)

## 2020-12-07 MED ORDER — ALPRAZOLAM 0.5 MG PO TABS: 0.5000 mg | ORAL_TABLET | Freq: Three times a day (TID) | ORAL | 0 refills | Status: DC

## 2020-12-07 MED ORDER — HEPARIN SODIUM (PORCINE) 5000 UNIT/ML IJ SOLN
5000.0000 [IU] | Freq: Three times a day (TID) | INTRAMUSCULAR | Status: DC
  Administered 2020-12-07 – 2020-12-23 (×47): 5000 [IU] via SUBCUTANEOUS
  Filled 2020-12-07 (×47): qty 1

## 2020-12-07 MED ORDER — HYDROXYZINE HCL 25 MG PO TABS
25.0000 mg | ORAL_TABLET | Freq: Four times a day (QID) | ORAL | Status: DC
  Administered 2020-12-07 – 2020-12-11 (×17): 25 mg via ORAL
  Filled 2020-12-07 (×17): qty 1

## 2020-12-07 MED ORDER — BUSPIRONE HCL 5 MG PO TABS: 5.0000 mg | ORAL_TABLET | Freq: Two times a day (BID) | ORAL | Status: DC

## 2020-12-07 MED ORDER — PANTOPRAZOLE SODIUM 40 MG PO TBEC
40.0000 mg | DELAYED_RELEASE_TABLET | Freq: Every day | ORAL | Status: DC
  Administered 2020-12-08 – 2021-01-05 (×29): 40 mg via ORAL
  Filled 2020-12-07 (×29): qty 1

## 2020-12-07 MED ORDER — ONDANSETRON 4 MG PO TBDP
4.0000 mg | ORAL_TABLET | Freq: Three times a day (TID) | ORAL | Status: DC | PRN
  Administered 2020-12-10 – 2020-12-22 (×7): 4 mg via ORAL
  Filled 2020-12-07 (×8): qty 1

## 2020-12-07 MED ORDER — LOSARTAN POTASSIUM 50 MG PO TABS: 50.0000 mg | ORAL_TABLET | Freq: Two times a day (BID) | ORAL | Status: DC

## 2020-12-07 MED ORDER — ENSURE SURGERY PO LIQD
237.0000 mL | Freq: Two times a day (BID) | ORAL | Status: DC
  Administered 2020-12-08 – 2021-01-02 (×29): 237 mL via ORAL
  Filled 2020-12-07 (×60): qty 237

## 2020-12-07 MED ORDER — ATORVASTATIN CALCIUM 80 MG PO TABS: 80.0000 mg | ORAL_TABLET | Freq: Every day | ORAL | Status: DC

## 2020-12-07 MED ORDER — LOSARTAN POTASSIUM 50 MG PO TABS
50.0000 mg | ORAL_TABLET | Freq: Two times a day (BID) | ORAL | Status: DC
  Administered 2020-12-07 – 2020-12-13 (×12): 50 mg via ORAL
  Filled 2020-12-07 (×12): qty 1

## 2020-12-07 MED ORDER — ATORVASTATIN CALCIUM 80 MG PO TABS
80.0000 mg | ORAL_TABLET | Freq: Every day | ORAL | Status: DC
  Administered 2020-12-08 – 2021-01-05 (×29): 80 mg via ORAL
  Filled 2020-12-07 (×29): qty 1

## 2020-12-07 MED ORDER — BUSPIRONE HCL 5 MG PO TABS
5.0000 mg | ORAL_TABLET | Freq: Two times a day (BID) | ORAL | Status: DC
  Administered 2020-12-07 – 2020-12-11 (×9): 5 mg via ORAL
  Filled 2020-12-07 (×9): qty 1

## 2020-12-07 MED ORDER — ONDANSETRON 4 MG PO TBDP
4.0000 mg | ORAL_TABLET | Freq: Three times a day (TID) | ORAL | Status: DC | PRN
  Administered 2020-12-07: 4 mg via ORAL
  Filled 2020-12-07: qty 1

## 2020-12-07 MED ORDER — ACETAMINOPHEN 650 MG RE SUPP: 650.0000 mg | RECTAL | Status: DC | PRN

## 2020-12-07 MED ORDER — ACETAMINOPHEN 160 MG/5ML PO SOLN: 650.0000 mg | ORAL | Status: DC | PRN

## 2020-12-07 MED ORDER — DEXAMETHASONE SODIUM PHOSPHATE 10 MG/ML IJ SOLN
6.0000 mg | Freq: Every day | INTRAMUSCULAR | Status: DC
  Administered 2020-12-08: 6 mg via INTRAVENOUS
  Filled 2020-12-07 (×2): qty 0.6

## 2020-12-07 MED ORDER — HEPARIN SODIUM (PORCINE) 5000 UNIT/ML IJ SOLN: 5000.0000 [IU] | Freq: Three times a day (TID) | INTRAMUSCULAR | Status: DC

## 2020-12-07 MED ORDER — ACETAMINOPHEN 325 MG PO TABS
650.0000 mg | ORAL_TABLET | ORAL | Status: DC | PRN
  Administered 2020-12-10 – 2021-01-04 (×17): 650 mg via ORAL
  Filled 2020-12-07 (×17): qty 2

## 2020-12-07 MED ORDER — SENNOSIDES-DOCUSATE SODIUM 8.6-50 MG PO TABS
1.0000 | ORAL_TABLET | Freq: Two times a day (BID) | ORAL | Status: DC
  Administered 2020-12-07 – 2020-12-23 (×32): 1 via ORAL
  Filled 2020-12-07 (×32): qty 1

## 2020-12-07 MED ORDER — ALPRAZOLAM 0.5 MG PO TABS
0.5000 mg | ORAL_TABLET | Freq: Three times a day (TID) | ORAL | Status: DC
  Administered 2020-12-07 – 2021-01-05 (×86): 0.5 mg via ORAL
  Filled 2020-12-07 (×86): qty 1

## 2020-12-07 MED ORDER — AMLODIPINE BESYLATE 5 MG PO TABS
5.0000 mg | ORAL_TABLET | Freq: Every day | ORAL | Status: DC
  Administered 2020-12-08 – 2020-12-20 (×13): 5 mg via ORAL
  Filled 2020-12-07 (×13): qty 1

## 2020-12-07 MED ORDER — AMLODIPINE BESYLATE 5 MG PO TABS: 5.0000 mg | ORAL_TABLET | Freq: Every day | ORAL | Status: DC

## 2020-12-07 NOTE — Plan of Care (Signed)
Patient progressing 

## 2020-12-07 NOTE — PMR Pre-admission (Signed)
PMR Admission Coordinator Pre-Admission Assessment  Patient: Felicia Chen is an 55 y.o., female MRN: [id number] DOB: 22-Jul-1965 Height: 5\' 8"  (172.7 cm) Weight: 117.9 kg  Insurance Information HMO:     PPO:      PCP:      IPA:      80/20:      OTHER:  PRIMARY: [employer] commercial      Policy#: [id number]      Subscriber: spouse CM Name: Tillie Rung      Phone#: [phone]     Fax#: [phone] Pre-Cert#: [id number] approved for 7 days f/u with [facility] phone [phone] fax [phone]      Employer:  Benefits:  Phone #: [phone]     Name: 9/19 Eff. Date: 03/20/2020     Deduct: $1500      Out of Pocket Max: $2500      Life Max: none CIR: 90%      SNF: 90% 60 days Outpatient: 90%     Co-Pay: 40 visits combined Home Health: 90%      Co-Pay: 120 visits combined DME: 90%     Co-Pay: 10% Providers: in network  SECONDARY: none      Policy#:      Phone#:   Development worker, community:       Phone#:   The Engineer, petroleum" for patients in Inpatient Rehabilitation Facilities with attached "Privacy Act Percy Records" was provided and verbally reviewed with: N/A  Emergency Contact Information Contact Information     Name Relation Home Work Mobile   [place] J Spouse [place] Daughter   [phone]       Current Medical History  Patient Admitting Diagnosis: IPH and SAH  History of Present Illness:  55 year old right-handed female with history of hyperlipidemia, anxiety, Gilbert's syndrome endometriosis status post TAH/BSO 2014, obesity with BMI 39.5 and recent COVID-19.   Presented 12/01/2020 with increasing headache right side weakness and neglect.  CT angiogram head and neck showed 60 cc left frontal parietal lobar hematoma with regional subarachnoid extension.  No spot sign or discrete vascular lesion underlying the lobar hematoma.  Mild atherosclerosis in the neck.  CT venogram of the head negative  for dural venous sinus thrombosis.  MRI showed overall stable size of large left parietal intraparenchymal hematoma with subarachnoid hemorrhage overlying the bilateral cerebral hemispheres and cerebellum.  No evidence of intraventricular extension or hemorrhage.  EEG negative for seizure.  Echocardiogram with ejection fraction of 60 to 65% no wall motion abnormality grade 1 diastolic dysfunction.  Patient did experience hypoxic respiratory failure on admission and monitored by critical care medicine.  Admission chemistries unremarkable except potassium 3.4 glucose 130, alcohol negative, urine drug screen negative, hemoglobin 15.2.  Hypertonic saline initiated.  Placed on Cleviprex for blood pressure control.  Neurology follow-up with conservative care.  Consideration made to repeat CTA in 2 weeks to reevaluate cerebral vasoconstriction versus atherosclerotic stenosis.  Hospital course patient did have a fall while using bedside commode 12/05/2020 sustaining right facial and eyelid bruising with swelling and cranial CT scan showed no progression of the left cerebral hematoma with mild subarachnoid extension.  No hydrocephalus or midline shift.  She was cleared to begin subcutaneous heparin for DVT prophylaxis.  In regards to patient's ELFYB-01 she did complete course of Remdesivir and weaned from dexamethasone.    Complete NIHSS TOTAL: 10  Patient's medical record from [facility] has been reviewed by the rehabilitation admission coordinator and physician.  Past Medical History  Past Medical History:  Diagnosis Date   Anxiety    Gilbert's syndrome     Has the patient had major surgery during 100 days prior to admission? No  Family History   family history is not on file.  Current Medications  Current Facility-Administered Medications:    acetaminophen (TYLENOL) tablet 650 mg, 650 mg, Oral, Q4H PRN, 650 mg at 12/07/20 1129 **OR** acetaminophen (TYLENOL) 160 MG/5ML solution 650 mg, 650 mg,  Per Tube, Q4H PRN **OR** acetaminophen (TYLENOL) suppository 650 mg, 650 mg, Rectal, Q4H PRN, Bhagat, Srishti L, MD   ALPRAZolam (XANAX) tablet 0.5 mg, 0.5 mg, Oral, TID, Bailey-Modzik, Delila A, NP, 0.5 mg at 12/07/20 0944   amLODipine (NORVASC) tablet 5 mg, 5 mg, Oral, Daily, Samtani, Jai-Gurmukh, MD, 5 mg at 12/07/20 0944   atorvastatin (LIPITOR) tablet 80 mg, 80 mg, Oral, Daily, Rosalin Hawking, MD, 80 mg at 12/07/20 0945   busPIRone (BUSPAR) tablet 5 mg, 5 mg, Oral, BID, Leonie Man, Pramod S, MD, 5 mg at 12/07/20 0944   Chlorhexidine Gluconate Cloth 2 % PADS 6 each, 6 each, Topical, Daily, Garvin Fila, MD, 6 each at 12/06/20 0908   dexamethasone (DECADRON) injection 6 mg, 6 mg, Intravenous, Daily, Chand, Sudham, MD, 6 mg at 12/07/20 0945   feeding supplement (ENSURE SURGERY) liquid 237 mL, 237 mL, Oral, BID BM, Rosalin Hawking, MD, 237 mL at 12/07/20 1411   heparin injection 5,000 Units, 5,000 Units, Subcutaneous, Q8H, Rosalin Hawking, MD, 5,000 Units at 12/07/20 1411   hydrALAZINE (APRESOLINE) injection 5-20 mg, 5-20 mg, Intravenous, Q4H PRN, Rosalin Hawking, MD   hydrOXYzine (ATARAX/VISTARIL) tablet 25 mg, 25 mg, Oral, QID, Leonie Man, Pramod S, MD, 25 mg at 12/07/20 1411   labetalol (NORMODYNE) injection 5-20 mg, 5-20 mg, Intravenous, Q2H PRN, Rosalin Hawking, MD, 20 mg at 12/06/20 0816   losartan (COZAAR) tablet 50 mg, 50 mg, Oral, BID, Rosalin Hawking, MD, 50 mg at 12/07/20 0944   MEDLINE mouth rinse, 15 mL, Mouth Rinse, BID, Leonie Man, Pramod S, MD, 15 mL at 12/07/20 0948   ondansetron (ZOFRAN-ODT) disintegrating tablet 4 mg, 4 mg, Oral, Q8H PRN, Nita Sells, MD, 4 mg at 12/07/20 3212   pantoprazole (PROTONIX) EC tablet 40 mg, 40 mg, Oral, Daily, Rosalin Hawking, MD, 40 mg at 12/07/20 0944   senna-docusate (Senokot-S) tablet 1 tablet, 1 tablet, Oral, BID, Bhagat, Srishti L, MD, 1 tablet at 12/07/20 0944  Patients Current Diet:  Diet Order             Diet Heart Room service appropriate? Yes; Fluid consistency:  Thin  Diet effective now                  Precautions / Restrictions Precautions Precautions: Fall Precaution Comments: R hemi, inattention. SBP parameters <160 per neuro Restrictions Weight Bearing Restrictions: No   Has the patient had 2 or more falls or a fall with injury in the past year? Yes Fell on acute hospital 9/18 from [facility]  Prior Activity Level Community (5-7x/wk): Independent and active  Prior Functional Level Self Care: Did the patient need help bathing, dressing, using the toilet or eating? Independent  Indoor Mobility: Did the patient need assistance with walking from room to room (with or without device)? Independent  Stairs: Did the patient need assistance with internal or external stairs (with or without device)? Independent  Functional Cognition: Did the patient need help planning regular tasks such as shopping or remembering to take medications? Independent  Patient Information  Are you of Hispanic, Latino/a,or Spanish origin?: A. No, not of Hispanic, Latino/a, or Spanish origin What is your race?: A. White Do you need or want an interpreter to communicate with a doctor or health care staff?: 0. No  Patient's Response To:  Health Literacy and Transportation Is the patient able to respond to health literacy and transportation needs?: Yes Health Literacy - How often do you need to have someone help you when you read instructions, pamphlets, or other written material from your doctor or pharmacy?: Never In the past 12 months, has lack of transportation kept you from medical appointments or from getting medications?: No In the past 12 months, has lack of transportation kept you from meetings, work, or from getting things needed for daily living?: No  Development worker, international aid / [place] Devices/Equipment: None Home Equipment: Shower seat, Hand held shower head  Prior Device Use: Indicate devices/aids used by the patient prior to current  illness, exacerbation or injury? None of the above  Current Functional Level Cognition  Arousal/Alertness: Awake/alert Overall Cognitive Status: Impaired/Different from baseline Current Attention Level: Sustained Orientation Level: Oriented X4 Following Commands: Follows one step commands inconsistently Safety/Judgement: Decreased awareness of safety, Decreased awareness of deficits General Comments: pt unable to remember all of her children's ages, was able to name them and where they live. Continued R inattention. Attention: Sustained Sustained Attention: Appears intact Memory: Impaired Memory Impairment: Storage deficit, Retrieval deficit, Decreased recall of new information Awareness: Impaired Awareness Impairment: Emergent impairment Executive Function: Self Monitoring, Self Correcting Self Monitoring: Impaired Self Monitoring Impairment: Verbal basic Self Correcting: Impaired Self Correcting Impairment: Verbal basic    Extremity Assessment (includes Sensation/Coordination)  Upper Extremity Assessment: RUE deficits/detail RUE Deficits / Details: flexor synergy pattern reflexive in nature; no active movement; spasticity; +clonus; non-functional RUE RUE Sensation: decreased light touch, decreased proprioception RUE Coordination: decreased fine motor, decreased gross motor  Lower Extremity Assessment: Defer to PT evaluation RLE Deficits / Details: 0/5 observed throughout RLE; hypertonicity with extensor tone in supine, assumes flexor tone when sitting EOB/standing RLE Sensation: decreased light touch LLE Deficits / Details: incoordination as assessed via heel-to-shin, at least 3/5 hip and knee flex/ext.    ADLs  Overall ADL's : Needs assistance/impaired Eating/Feeding: Minimal assistance Grooming: Moderate assistance, Sitting Upper Body Bathing: Moderate assistance, Sitting Lower Body Bathing: Maximal assistance, Bed level Upper Body Dressing : Maximal assistance,  Sitting Lower Body Dressing: Total assistance, Bed level Lower Body Dressing Details (indicate cue type and reason): to don socks Toileting- Clothing Manipulation and Hygiene: Total assistance Toileting - Clothing Manipulation Details (indicate cue type and reason): purewick in place at beginning and end of session Functional mobility during ADLs: Maximal assistance, +2 for physical assistance General ADL Comments: 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 Max A +2 for LE's off EOB and elevation of trunk to upright    Transfers  Overall transfer level: Needs assistance Equipment used: 2 person hand held assist Transfers: Sit to/from Stand, Stand Pivot Transfers Sit to Stand: Mod assist, +2 physical assistance Stand pivot transfers: Max assist, +2 physical assistance Squat  pivot transfers: Mod assist, +2 physical assistance General transfer comment: needed full support R side due to R lean and buckling of R knee. Pt with very good effort to get self to chair, almost hopping on LLE but needed mod/ max A on R during this    Ambulation / Gait / Stairs / Wheelchair Mobility  Ambulation/Gait General Gait Details: R knee buckles with L step    Posture / Balance Dynamic Sitting Balance Sitting balance - Comments: worked on leaning to L and then recovering to R without overcorrecting. Worked EOB as well as in Psychologist, occupational. Balance Overall balance assessment: Needs assistance Sitting-balance support: Single extremity supported Sitting balance-Leahy Scale: Fair Sitting balance -  Comments: worked on leaning to L and then recovering to R without overcorrecting. Worked EOB as well as in Psychologist, occupational. Postural control: Right lateral lean Standing balance support: Bilateral upper extremity supported, During functional activity Standing balance-Leahy Scale: Poor Standing balance comment: worked on wt shifting in standing with verbal and tactile cues. Needed full knee block on R.    Special needs/care consideration Fall form [facility] on acute 9/18 Post COVID isolation d/c'd 9/19   Previous Home Environment  Living Arrangements: Children, Spouse/significant other  Lives With: Family Available Help at Discharge: Family, Available 24 hours/day (spouse works from home and other family can assist) Type of Home: House Home Layout: One level Home Access: Stairs to enter [employer] of Steps: 2 (from kitchen to [employer]) [employer] Shower/Tub: Gaffer, Chiropodist: Standard Bathroom Accessibility: Yes How Accessible: Accessible via walker [place]: No  Discharge Living Setting Plans for Discharge Living Setting: Patient's home, Lives with (comment) (spouse and children) Type of Home at Discharge: House Discharge Home Layout: One level Discharge Home Access: Stairs to enter Entrance Stairs-Rails: None Entrance Stairs-Number of Steps: 2 from kitchen to [employer] Discharge Bathroom Shower/Tub: Careers adviser unit, Horticulturist, commercial: Standard Discharge Bathroom Accessibility: Yes How Accessible: Accessible via walker Does the patient have any problems obtaining your medications?: No  Social/Family/Support Systems Patient Roles: Spouse, Caregiver, Parent Contact Information: spouse, [name] Anticipated Caregiver: spouse and Family Anticipated Ambulance person Information: see above Ability/Limitations of Caregiver: spouse works from home; Librarian, academic for workers compensation cases Caregiver Availability: 24/7 Discharge  Plan Discussed with Primary Caregiver: Yes Is Caregiver In Agreement with Plan?: Yes Does Caregiver/Family have Issues with Lodging/Transportation while Pt is in Rehab?: No  Goals Patient/Family Goal for Rehab: Min assist with PT and OT, supervision with SLP Expected length of stay: ELOS 3 to 4 weeks Pt/Family Agrees to Admission and willing to participate: Yes Program Orientation Provided & Reviewed with Pt/Caregiver Including Roles  & Responsibilities: Yes  Decrease burden of Care through IP rehab admission: n/a  Possible need for SNF placement upon discharge: not anticipated  Patient Condition: I have reviewed medical records from [facility] , spoken with CM, and patient, spouse, and daughter. I met with patient at the bedside for inpatient rehabilitation assessment.  Patient will benefit from ongoing PT, OT, and SLP, can actively participate in 3 hours of therapy a day 5 days of the week, and can make measurable gains during the admission.  Patient will also benefit from the coordinated team approach during an Inpatient Acute Rehabilitation admission.  The patient will receive intensive therapy as well as Rehabilitation physician, nursing, social worker, and care management interventions.  Due to bladder management, bowel management, safety, skin/wound care, disease management, medication administration, pain management, and patient education the patient requires  24 hour a day rehabilitation nursing.  The patient is currently Mod to max assist with mobility and basic ADLs.  Discharge setting and therapy post discharge at home with home health is anticipated.  Patient has agreed to participate in the Acute Inpatient Rehabilitation Program and will admit today.  Preadmission Screen Completed By:  Cleatrice Burke, 12/07/2020 2:25 PM ______________________________________________________________________   Discussed status with Dr. Posey Pronto on 12/07/2020 at 1435 and received approval for  admission today.  Admission Coordinator:  Cleatrice Burke, RN, time  1435 Date  12/07/2020   Assessment/Plan: Diagnosis: IPH and SAH Does the need for close, 24 hr/day Medical supervision in concert with the patient's rehab needs make it unreasonable for this patient to be served in a less intensive setting? Yes Co-Morbidities requiring supervision/potential complications: hyperlipidemia, anxiety, Gilbert's syndrome, endometriosis status post TAH/BSO 2014, obesity  Due to bladder management, safety, disease management, and patient education, does the patient require 24 hr/day rehab nursing? Yes Does the patient require coordinated care of a physician, rehab nurse, PT, OT, and SLP to address physical and functional deficits in the context of the above medical diagnosis(es)? Yes Addressing deficits in the following areas: balance, endurance, locomotion, strength, transferring, bathing, dressing, toileting, cognition, and psychosocial support Can the patient actively participate in an intensive therapy program of at least 3 hrs of therapy 5 days a week? Yes The potential for patient to make measurable gains while on inpatient rehab is excellent Anticipated functional outcomes upon discharge from inpatient rehab: min assist PT, min assist OT, supervision and min assist SLP Estimated rehab length of stay to reach the above functional goals is: 18-21 days. Anticipated discharge destination: Home 10. Overall Rehab/Functional Prognosis: good   MD Signature: Delice Lesch, MD, ABPMR

## 2020-12-07 NOTE — Progress Notes (Signed)
Inpatient Rehabilitation Medication Review by a Pharmacist  A complete drug regimen review was completed for this patient to identify any potential clinically significant medication issues.  High Risk Drug Classes Is patient taking? Indication by Medication  Antipsychotic No   Anticoagulant Yes Heparin for VTE prophylaxis  Antibiotic No   Opioid No   Antiplatelet No   Hypoglycemics/insulin No   Vasoactive Medication Yes Losartan, amlodipine for hypertension  Chemotherapy No   Other No      Type of Medication Issue Identified Description of Issue Recommendation(s)  Drug Interaction(s) (clinically significant)     Duplicate Therapy     Allergy     No Medication Administration End Date     Incorrect Dose     Additional Drug Therapy Needed     Significant med changes from prior encounter (inform family/care partners about these prior to discharge). Sertraline has been discontinued Inform pt/caregivers prior to discharge from CIR  Other       Clinically significant medication issues were identified that warrant physician communication and completion of prescribed/recommended actions by midnight of the next day:  No  Name of provider notified for urgent issues identified:  N/A  Time spent performing this drug regimen review (minutes):  [place], PharmD, BCPS, [facility] Clinical Pharmacist 12/07/2020 5:54 PM

## 2020-12-07 NOTE — Progress Notes (Signed)
Jamse Arn, MD   Physician  Physical Medicine and Rehabilitation  PMR Pre-admission     Signed  Date of Service:  12/07/2020  2:24 PM       Related encounter: ED to Hosp-Admission (Current) from 12/01/2020 in [place] [facility]       Signed      Show:Clear all [x] Written[x] Templated[x] Copied  Added by: [x] Cristina Gong, RN[x] Jamse Arn, MD  [] Hover for details                                                                                                                                                                                                                                                                                                                                                                                                                                           PMR Admission Coordinator Pre-Admission Assessment   Patient: Felicia Chen is an 55 y.o., female MRN: [id number] DOB: Mar 08, 1966 Height: 5\' 8"  (172.7 cm) Weight: 117.9 kg   Insurance Information HMO:     PPO:      PCP:      IPA:      80/20:      OTHER:  PRIMARY: [employer] commercial      Policy#: [id number]      Subscriber: spouse CM Name: Tillie Rung      Phone#: [phone]  Fax#: [phone] Pre-Cert#: [id number] approved for 7 days f/u with [facility] phone [phone] fax [phone]      Employer:  Benefits:  Phone #: [phone]     Name: 9/19 Eff. Date: 03/20/2020     Deduct: $1500      Out of Pocket Max: $2500      Life Max: none CIR: 90%      SNF: 90% 60 days Outpatient: 90%     Co-Pay: 40 visits combined Home Health: 90%      Co-Pay: 120 visits combined DME: 90%     Co-Pay: 10% Providers: in network  SECONDARY: none      Policy#:      Phone#:    Development worker, community:       Phone#:    The Conservator, museum/gallery" for patients in Inpatient Rehabilitation Facilities with attached "Privacy Act [place] Records" was provided and verbally reviewed with: N/A   Emergency Contact Information Contact Information       Name Relation Home Work Mobile    Theresa J Spouse [place] Daughter     [phone]           Current Medical History  Patient Admitting Diagnosis: IPH and SAH   History of Present Illness:  55 year old right-handed female with history of hyperlipidemia, anxiety, Gilbert's syndrome endometriosis status post TAH/BSO 2014, obesity with BMI 39.5 and recent COVID-19.   Presented 12/01/2020 with increasing headache right side weakness and neglect.  CT angiogram head and neck showed 60 cc left frontal parietal lobar hematoma with regional subarachnoid extension.  No spot sign or discrete vascular lesion underlying the lobar hematoma.  Mild atherosclerosis in the neck.  CT venogram of the head negative for dural venous sinus thrombosis.  MRI showed overall stable size of large left parietal intraparenchymal hematoma with subarachnoid hemorrhage overlying the bilateral cerebral hemispheres and cerebellum.  No evidence of intraventricular extension or hemorrhage.  EEG negative for seizure.  Echocardiogram with ejection fraction of 60 to 65% no wall motion abnormality grade 1 diastolic dysfunction.  Patient did experience hypoxic respiratory failure on admission and monitored by critical care medicine.  Admission chemistries unremarkable except potassium 3.4 glucose 130, alcohol negative, urine drug screen negative, hemoglobin 15.2.  Hypertonic saline initiated.  Placed on Cleviprex for blood pressure control.  Neurology follow-up with conservative care.  Consideration made to repeat CTA in 2 weeks to reevaluate cerebral vasoconstriction versus atherosclerotic stenosis.  Hospital course patient did have a fall while using bedside  commode 12/05/2020 sustaining right facial and eyelid bruising with swelling and cranial CT scan showed no progression of the left cerebral hematoma with mild subarachnoid extension.  No hydrocephalus or midline shift.  She was cleared to begin subcutaneous heparin for DVT prophylaxis.  In regards to patient's IHKVQ-25 she did complete course of Remdesivir and weaned from dexamethasone.     Complete NIHSS TOTAL: 10   Patient's medical record from [facility] has been reviewed by the rehabilitation admission coordinator and physician.   Past Medical History      Past Medical History:  Diagnosis Date   Anxiety     Gilbert's syndrome        Has the patient had major surgery during 100 days prior to admission? No   Family History   family history is not on file.   Current Medications   Current Facility-Administered Medications:    acetaminophen (TYLENOL) tablet 650 mg, 650 mg, Oral,  Q4H PRN, 650 mg at 12/07/20 1129 **OR** acetaminophen (TYLENOL) 160 MG/5ML solution 650 mg, 650 mg, Per Tube, Q4H PRN **OR** acetaminophen (TYLENOL) suppository 650 mg, 650 mg, Rectal, Q4H PRN, Bhagat, Srishti L, MD   ALPRAZolam (XANAX) tablet 0.5 mg, 0.5 mg, Oral, TID, Bailey-Modzik, Delila A, NP, 0.5 mg at 12/07/20 0944   amLODipine (NORVASC) tablet 5 mg, 5 mg, Oral, Daily, Samtani, Jai-Gurmukh, MD, 5 mg at 12/07/20 0944   atorvastatin (LIPITOR) tablet 80 mg, 80 mg, Oral, Daily, Rosalin Hawking, MD, 80 mg at 12/07/20 0945   busPIRone (BUSPAR) tablet 5 mg, 5 mg, Oral, BID, Leonie Man, Pramod S, MD, 5 mg at 12/07/20 0944   Chlorhexidine Gluconate Cloth 2 % PADS 6 each, 6 each, Topical, Daily, Garvin Fila, MD, 6 each at 12/06/20 0908   dexamethasone (DECADRON) injection 6 mg, 6 mg, Intravenous, Daily, Chand, Sudham, MD, 6 mg at 12/07/20 0945   feeding supplement (ENSURE SURGERY) liquid 237 mL, 237 mL, Oral, BID BM, Rosalin Hawking, MD, 237 mL at 12/07/20 1411   heparin injection 5,000 Units, 5,000 Units,  Subcutaneous, Q8H, Rosalin Hawking, MD, 5,000 Units at 12/07/20 1411   hydrALAZINE (APRESOLINE) injection 5-20 mg, 5-20 mg, Intravenous, Q4H PRN, Rosalin Hawking, MD   hydrOXYzine (ATARAX/VISTARIL) tablet 25 mg, 25 mg, Oral, QID, Leonie Man, Pramod S, MD, 25 mg at 12/07/20 1411   labetalol (NORMODYNE) injection 5-20 mg, 5-20 mg, Intravenous, Q2H PRN, Rosalin Hawking, MD, 20 mg at 12/06/20 0816   losartan (COZAAR) tablet 50 mg, 50 mg, Oral, BID, Rosalin Hawking, MD, 50 mg at 12/07/20 0944   MEDLINE mouth rinse, 15 mL, Mouth Rinse, BID, Leonie Man, Pramod S, MD, 15 mL at 12/07/20 0948   ondansetron (ZOFRAN-ODT) disintegrating tablet 4 mg, 4 mg, Oral, Q8H PRN, Nita Sells, MD, 4 mg at 12/07/20 9379   pantoprazole (PROTONIX) EC tablet 40 mg, 40 mg, Oral, Daily, Rosalin Hawking, MD, 40 mg at 12/07/20 0944   senna-docusate (Senokot-S) tablet 1 tablet, 1 tablet, Oral, BID, Bhagat, Srishti L, MD, 1 tablet at 12/07/20 0944   Patients Current Diet:  Diet Order                  Diet Heart Room service appropriate? Yes; Fluid consistency: Thin  Diet effective now                       Precautions / Restrictions Precautions Precautions: Fall Precaution Comments: R hemi, inattention. SBP parameters <160 per neuro Restrictions Weight Bearing Restrictions: No    Has the patient had 2 or more falls or a fall with injury in the past year? Yes Fell on acute hospital 9/18 from [facility]   Prior Activity Level Community (5-7x/wk): Independent and active   Prior Functional Level Self Care: Did the patient need help bathing, dressing, using the toilet or eating? Independent   Indoor Mobility: Did the patient need assistance with walking from room to room (with or without device)? Independent   Stairs: Did the patient need assistance with internal or external stairs (with or without device)? Independent   Functional Cognition: Did the patient need help planning regular tasks such as shopping or remembering to take  medications? Independent   Patient Information Are you of Hispanic, Latino/a,or Spanish origin?: A. No, not of Hispanic, Latino/a, or Spanish origin What is your race?: A. White Do you need or want an interpreter to communicate with a doctor or health care staff?: 0. No   Patient's Response To:  Health Literacy and Transportation Is the patient able to respond to health literacy and transportation needs?: Yes Health Literacy - How often do you need to have someone help you when you read instructions, pamphlets, or other written material from your doctor or pharmacy?: Never In the past 12 months, has lack of transportation kept you from medical appointments or from getting medications?: No In the past 12 months, has lack of transportation kept you from meetings, work, or from getting things needed for daily living?: No   Development worker, international aid / [place] Devices/Equipment: None Home Equipment: Shower seat, Hand held shower head   Prior Device Use: Indicate devices/aids used by the patient prior to current illness, exacerbation or injury? None of the above   Current Functional Level Cognition   Arousal/Alertness: Awake/alert Overall Cognitive Status: Impaired/Different from baseline Current Attention Level: Sustained Orientation Level: Oriented X4 Following Commands: Follows one step commands inconsistently Safety/Judgement: Decreased awareness of safety, Decreased awareness of deficits General Comments: pt unable to remember all of her children's ages, was able to name them and where they live. Continued R inattention. Attention: Sustained Sustained Attention: Appears intact Memory: Impaired Memory Impairment: Storage deficit, Retrieval deficit, Decreased recall of new information Awareness: Impaired Awareness Impairment: Emergent impairment Executive Function: Self Monitoring, Self Correcting Self Monitoring: Impaired Self Monitoring Impairment: Verbal basic Self  Correcting: Impaired Self Correcting Impairment: Verbal basic    Extremity Assessment (includes Sensation/Coordination)   Upper Extremity Assessment: RUE deficits/detail RUE Deficits / Details: flexor synergy pattern reflexive in nature; no active movement; spasticity; +clonus; non-functional RUE RUE Sensation: decreased light touch, decreased proprioception RUE Coordination: decreased fine motor, decreased gross motor  Lower Extremity Assessment: Defer to PT evaluation RLE Deficits / Details: 0/5 observed throughout RLE; hypertonicity with extensor tone in supine, assumes flexor tone when sitting EOB/standing RLE Sensation: decreased light touch LLE Deficits / Details: incoordination as assessed via heel-to-shin, at least 3/5 hip and knee flex/ext.     ADLs   Overall ADL's : Needs assistance/impaired Eating/Feeding: Minimal assistance Grooming: Moderate assistance, Sitting Upper Body Bathing: Moderate assistance, Sitting Lower Body Bathing: Maximal assistance, Bed level Upper Body Dressing : Maximal assistance, Sitting Lower Body Dressing: Total assistance, Bed level Lower Body Dressing Details (indicate cue type and reason): to don socks Toileting- Clothing Manipulation and Hygiene: Total assistance Toileting - Clothing Manipulation Details (indicate cue type and reason): purewick in place at beginning and end of session Functional mobility during ADLs: Maximal assistance, +2 for physical assistance General ADL Comments: affected by 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 Max  A +2 for LE's off EOB and elevation of trunk to upright     Transfers   Overall transfer level: Needs assistance Equipment used: 2 person hand held assist Transfers: Sit to/from Stand, Stand Pivot Transfers Sit to Stand: Mod assist, +2 physical assistance Stand pivot transfers: Max assist, +2 physical assistance Squat pivot transfers: Mod assist, +2 physical assistance General transfer comment: needed full support R side due to R lean and buckling of R knee. Pt with very good effort to get self to chair,  almost hopping on LLE but needed mod/ max A on R during this     Ambulation / Gait / Stairs / Wheelchair Mobility   Ambulation/Gait General Gait Details: R knee buckles with L step     Posture / Balance Dynamic Sitting Balance Sitting balance - Comments: worked on leaning to L and then recovering to R without overcorrecting. Worked EOB as well as in Psychologist, occupational. Balance Overall balance assessment: Needs assistance Sitting-balance support: Single extremity supported Sitting balance-Leahy Scale: Fair Sitting balance - Comments: worked on leaning to L and then recovering to R without overcorrecting. Worked EOB as well as in Psychologist, occupational. Postural control: Right lateral lean Standing balance support: Bilateral upper extremity supported, During functional activity Standing balance-Leahy Scale: Poor Standing balance comment: worked on wt shifting in standing with verbal and tactile cues. Needed full knee block on R.     Special needs/care consideration Fall form [facility] on acute 9/18 Post COVID isolation d/c'd 9/19    Previous Home Environment  Living Arrangements: Children, Spouse/significant other  Lives With: Family Available Help at Discharge: Family, Available 24 hours/day (spouse works from home and other family can assist) Type of Home: House Home Layout: One level Home Access: Stairs to enter [employer] of Steps: 2 (from kitchen to [employer]) [employer] Shower/Tub: Gaffer,  Chiropodist: Standard Bathroom Accessibility: Yes How Accessible: Accessible via walker [place]: No   Discharge Living Setting Plans for Discharge Living Setting: Patient's home, Lives with (comment) (spouse and children) Type of Home at Discharge: House Discharge Home Layout: One level Discharge Home Access: Stairs to enter Entrance Stairs-Rails: None Entrance Stairs-Number of Steps: 2 from kitchen to [employer] Discharge Bathroom Shower/Tub: Careers adviser unit, Horticulturist, commercial: Standard Discharge Bathroom Accessibility: Yes How Accessible: Accessible via walker Does the patient have any problems obtaining your medications?: No   Social/Family/Support Systems Patient Roles: Spouse, Caregiver, Parent Contact Information: spouse, [name] Anticipated Caregiver: spouse and Family Anticipated Ambulance person Information: see above Ability/Limitations of Caregiver: spouse works from home; Librarian, academic for workers compensation cases Caregiver Availability: 24/7 Discharge Plan Discussed with Primary Caregiver: Yes Is Caregiver In Agreement with Plan?: Yes Does Caregiver/Family have Issues with Lodging/Transportation while Pt is in Rehab?: No   Goals Patient/Family Goal for Rehab: Min assist with PT and OT, supervision with SLP Expected length of stay: ELOS 3 to 4 weeks Pt/Family Agrees to Admission and willing to participate: Yes Program Orientation Provided & Reviewed with Pt/Caregiver Including Roles  & Responsibilities: Yes   Decrease burden of Care through IP rehab admission: n/a   Possible need for SNF placement upon discharge: not anticipated   Patient Condition: I have reviewed medical records from [facility] , spoken with CM, and patient, spouse, and daughter. I met with patient at the bedside for inpatient rehabilitation assessment.  Patient will benefit from ongoing PT, OT, and SLP, can actively participate in 3 hours  of therapy a day 5 days of the week, and can make measurable gains during the admission.  Patient will also benefit from the coordinated team approach during an Inpatient Acute Rehabilitation admission.  The patient will receive intensive therapy as well as Rehabilitation physician, nursing, social worker, and care management interventions.  Due to bladder management, bowel management, safety, skin/wound care, disease management, medication administration, pain management, and patient education the patient requires 24 hour a day rehabilitation nursing.  The patient is currently Mod to max assist with mobility and basic ADLs.  Discharge setting  and therapy post discharge at home with home health is anticipated.  Patient has agreed to participate in the Acute Inpatient Rehabilitation Program and will admit today.   Preadmission Screen Completed By:  Cleatrice Burke, 12/07/2020 2:25 PM ______________________________________________________________________   Discussed status with Dr. Posey Pronto on 12/07/2020 at 1435 and received approval for admission today.   Admission Coordinator:  Cleatrice Burke, RN, time  1435 Date  12/07/2020    Assessment/Plan: Diagnosis: IPH and SAH Does the need for close, 24 hr/day Medical supervision in concert with the patient's rehab needs make it unreasonable for this patient to be served in a less intensive setting? Yes Co-Morbidities requiring supervision/potential complications: hyperlipidemia, anxiety, Gilbert's syndrome, endometriosis status post TAH/BSO 2014, obesity  Due to bladder management, safety, disease management, and patient education, does the patient require 24 hr/day rehab nursing? Yes Does the patient require coordinated care of a physician, rehab nurse, PT, OT, and SLP to address physical and functional deficits in the context of the above medical diagnosis(es)? Yes Addressing deficits in the following areas: balance, endurance, locomotion,  strength, transferring, bathing, dressing, toileting, cognition, and psychosocial support Can the patient actively participate in an intensive therapy program of at least 3 hrs of therapy 5 days a week? Yes The potential for patient to make measurable gains while on inpatient rehab is excellent Anticipated functional outcomes upon discharge from inpatient rehab: min assist PT, min assist OT, supervision and min assist SLP Estimated rehab length of stay to reach the above functional goals is: 18-21 days. Anticipated discharge destination: Home 10. Overall Rehab/Functional Prognosis: good     MD Signature: Delice Lesch, MD, ABPMR         Revision History                     Note Details  Author Jamse Arn, MD File Time 12/07/2020  2:54 PM  Author Type Physician Status Signed  Last Editor Jamse Arn, MD Service Physical Medicine and Rehabilitation

## 2020-12-07 NOTE — Progress Notes (Signed)
Physical Therapy Treatment Patient Details Name: Felicia Chen MRN: [id number] DOB: 24-Mar-1965 Today's Date: 12/07/2020   History of Present Illness 55 yo female presents to [facility] on 9/14 with intense headache, R hemiplegia. CTH/MRI shows 60 cc left frontal parietal lobar hematoma with SAH extending over bilat cerebral hemispheres and cerebellum, L temporal lobe ischemic CVA. Pt also with covid-19. Fall off [facility] 9/18; stat CT  -No progression of the left cerebral hematoma with mild subarachnoid extension. PMH includes anxiety, Gilbert's syndrome, obesity, HLD.    PT Comments    Pt received in supine, agreeable to therapy session and with good participation and tolerance for bed mobility,  seated balance/strengthening activity and transfer training. Pt needing up to +2 maxA for bed mobility and maxA for partial stand and squat pivots toward R side. Emphasis on midline seated balance, seated weight shifting/reaching, attention to R side, activity pacing and fall precautions. RN/unit secretary notified pt room feels warm and AC not working although temp turned down, pt also with slightly elevated temp (see below). Pt continues to benefit from PT services to progress toward functional mobility goals.    Recommendations for follow up therapy are one component of a multi-disciplinary discharge planning process, led by the attending physician.  Recommendations may be updated based on patient status, additional functional criteria and insurance authorization.  Follow Up Recommendations  CIR     Equipment Recommendations  3in1 (PT);Wheelchair (measurements PT);Wheelchair cushion (measurements PT)    Recommendations for Other Services Rehab consult     Precautions / Restrictions Precautions Precautions: Fall Precaution Comments: R hemi, inattention. SBP parameters <160 per neuro Restrictions Weight Bearing Restrictions: No     Mobility  Bed Mobility Overal bed mobility: Needs  Assistance Bed Mobility: Rolling;Sidelying to Sit;Sit to Sidelying Rolling: Mod assist Sidelying to sit: Max assist;+2 for physical assistance;HOB elevated     Sit to sidelying: Max assist;+2 for physical assistance General bed mobility comments: Max A +2 for LE's off EOB and elevation of trunk to upright, pt able to use LUE on R bedrail when sitting up on R EOB    Transfers Overall transfer level: Needs assistance Equipment used: 1 person hand held assist (face to face partial stand) Transfers: Sit to/from [facility] Sit to Stand: Mod assist   Squat pivot transfers: Max assist     General transfer comment: partial stand and squat scoots toward [facility] with face to face posture, pt unable to reach full upright posture in stance 2/2 fatigue and c/o dizziness (BP 120's/70's)  Ambulation/Gait        General Gait Details: pt unable with +1 assist              Modified Rankin (Stroke Patients Only) Modified Rankin (Stroke Patients Only) Pre-Morbid Rankin Score: No symptoms Modified Rankin: Severe disability     Balance Overall balance assessment: Needs assistance Sitting-balance support: Single extremity supported Sitting balance-Leahy Scale: Poor Sitting balance - Comments: pt with LOB toward R side x2 while performing seated weight shifting/scooting at EOB, needing mod/maxA to correct; can be impulsive to lean/scoot when uncomfortable so needs min guard at least Postural control: Right lateral lean Standing balance support: Bilateral upper extremity supported Standing balance-Leahy Scale: Poor Standing balance comment: worked on squat scooting toward [facility] via face to face posture and attempted static stand but unable to reach upright with +1 assist today.                  Cognition Arousal/Alertness: Awake/alert Behavior  During Therapy: Impulsive;Anxious Overall Cognitive Status: Impaired/Different from baseline Area of Impairment:  Attention;Following commands;Safety/judgement;Problem solving;Awareness;Memory                 Orientation Level: Disoriented to;Time Current Attention Level: Sustained Memory: Decreased recall of precautions;Decreased short-term memory Following Commands: Follows one step commands inconsistently;Follows one step commands with increased time Safety/Judgement: Decreased awareness of safety;Decreased awareness of deficits Awareness: Emergent Problem Solving: Slow processing;Decreased initiation;Difficulty sequencing;Requires verbal cues;Requires tactile cues General Comments: pt anxious regarding deficits and some apraxia and word finding difficulties. Continued R inattention.      Exercises Other Exercises Other Exercises: hand over hand guidance of LUE to assist RUE in lifting 5x working on gentle ROM Other Exercises: LLE AROM: ankle pumps and circles (needs AA), LAQ, hip flexion (needs AA to initiate) x10 reps (assist due to apraxia rather than decreased strength) Other Exercises: STS x1 for BLE strengthening Other Exercises: RLE PROM: ankle pumps, hip flexion, knee flex/ext x5-10 reps ea    General Comments General comments (skin integrity, edema, etc.): Pt noted with damp gown/hair and states daughter washed her hair earlier, but still noted to be warm/clammy so checked temp: 100.1 *F, put it in flowsheet and RN notified, obtained small fan for pt room as A/C does not seem to be working; BP/HR stable      Pertinent Vitals/Pain Pain Assessment: Faces Faces Pain Scale: No hurt Pain Location: "it feels weird" Pain Descriptors / Indicators: Numbness Pain Intervention(s): Limited activity within patient's tolerance;Monitored during session;Repositioned     PT Goals (current goals can now be found in the care plan section) Acute Rehab PT Goals Patient Stated Goal: get better PT Goal Formulation: With patient/family Time For Goal Achievement: 12/16/20 Potential to Achieve  Goals: Good Progress towards PT goals: Progressing toward goals    Frequency    Min 4X/week      PT Plan Current plan remains appropriate       AM-PAC PT "6 Clicks" Mobility   Outcome Measure  Help needed turning from your back to your side while in a flat bed without using bedrails?: A Lot Help needed moving from lying on your back to sitting on the side of a flat bed without using bedrails?: Total Help needed moving to and from a bed to a chair (including a wheelchair)?: Total Help needed standing up from a chair using your arms (e.g., wheelchair or bedside chair)?: Total Help needed to walk in hospital room?: Total Help needed climbing 3-5 steps with a railing? : Total 6 Click Score: 7    End of Session Equipment Utilized During Treatment: Gait belt Activity Tolerance: Patient tolerated treatment well Patient left: in bed;with call bell/phone within reach;with bed alarm set;Other (comment) (heels floated, cushion under RUE to elevate) Nurse Communication: Mobility status;Need for lift equipment;Other (comment) (pt slightly elevated temp (100* F) and AC feels hot in room (Unit Sec notified)) PT Visit Diagnosis: Hemiplegia and hemiparesis;Other symptoms and signs involving the nervous system (R29.898);Other abnormalities of gait and mobility (R26.89) Hemiplegia - Right/Left: Right Hemiplegia - dominant/non-dominant: Dominant Hemiplegia - caused by: Nontraumatic intracerebral hemorrhage;Cerebral infarction     Time: 5427-0623 PT Time Calculation (min) (ACUTE ONLY): 26 min  Charges:  $Therapeutic Exercise: 8-22 mins $Therapeutic Activity: 8-22 mins                     Eisha Chatterjee P., PTA Acute Rehabilitation Services Pager: [phone] Office: [place] 12/07/2020, 4:34 PM

## 2020-12-07 NOTE — H&P (Signed)
Physical Medicine and Rehabilitation Admission H&P    Chief Complaint Patient presents with  Code Stroke : HPI: Felicia Chen is a 55 year old right-handed female with history of hyperlipidemia, anxiety, Gilbert's syndrome endometriosis status post TAH/BSO 2014, obesity with BMI 39.5 and recent COVID-19.  History taken from patient and chart review due to cognition patient lives with spouse.  1 level home 2 steps to entry.  Independent prior to admission provides assistance for her 100 year old mother.  She presented on 12/01/2020 with increasing headache, right side weakness and neglect.  CT angiogram head and neck showed 60 cc left frontal parietal lobar hematoma with regional subarachnoid extension.  No spot sign or discrete vascular lesion underlying the lobar hematoma.  Mild atherosclerosis in the neck.  CT venogram of the head negative for dural venous sinus thrombosis.  MRI showed overall stable size of large left parietal intraparenchymal hematoma with subarachnoid hemorrhage overlying the bilateral cerebral hemispheres and cerebellum.  No evidence of intraventricular extension or hemorrhage.  EEG negative for seizures.  Echocardiogram with ejection fraction of 60-65%, no wall motion abnormality grade 1 diastolic dysfunction.  Patient did experience hypoxic respiratory failure on admission and monitored by critical care medicine.  Admission chemistries unremarkable except potassium 3.4, glucose 130, alcohol negative, urine drug screen negative, hemoglobin 15.2.  Hypertonic saline initiated.  Placed on Cleviprex for blood pressure control.  Neurology follow-up with conservative care.  Consideration made to repeat CTA in 2 weeks to reevaluate cerebral vasoconstriction versus atherosclerotic stenosis.  Hospital course patient did have a fall while using bedside commode 12/05/2020 sustaining right facial and eyelid bruising with swelling and cranial CT scan showed no progression of the left  cerebral hematoma with mild subarachnoid extension.  No hydrocephalus or midline shift.  She was cleared to begin subcutaneous heparin for DVT prophylaxis.  In regards to patient's [id number] she did complete course of Remdesivir and weaned from dexamethasone.  Therapy evaluations completed due to patient's right-sided hemiparesis with sensory deficits was admitted for a comprehensive rehab program.  Please see preadmission assessment from earlier today as well.  Review of Systems  Constitutional:  Negative for chills.       Low-grade fever  HENT:  Negative for hearing loss.   Eyes:  Negative for blurred vision and double vision.  Respiratory:  Positive for cough. Negative for shortness of breath.   Cardiovascular:  Negative for chest pain, palpitations and leg swelling.  Gastrointestinal:  Negative for heartburn, nausea and vomiting.  Genitourinary:  Negative for dysuria, flank pain and hematuria.  Musculoskeletal:  Negative for joint pain and myalgias.  Skin:  Negative for rash.  Neurological:  Positive for sensory change, focal weakness and weakness.  Psychiatric/Behavioral:  Positive for depression.   All other systems reviewed and are negative. Past Medical History: Diagnosis Date  Anxiety   Gilbert's syndrome   Past Surgical History: Procedure Laterality Date  ABDOMINAL HYSTERECTOMY N/A 06/11/2012  Procedure: HYSTERECTOMY ABDOMINAL;  Surgeon: Allena Katz, MD;  Location: [place] [facility];  Service: Gynecology;  Laterality: N/A;  CESAREAN SECTION    x 4  CYSTOSCOPY N/A 06/11/2012  Procedure: CYSTOSCOPY;  Surgeon: Allena Katz, MD;  Location: [facility] [facility];  Service: Gynecology;  Laterality: N/A;  HERNIA REPAIR    LAPAROSCOPIC ASSISTED VAGINAL HYSTERECTOMY N/A 06/11/2012  Procedure: LAPAROSCOPIC ASSISTED VAGINAL HYSTERECTOMY atttempted;  Surgeon: Allena Katz, MD;  Location: [place] [facility];  Service: Gynecology;  Laterality: N/A;  Attempted Laparoscopic assisted vaginal  hysterectomy.  LAPAROSCOPIC LYSIS OF ADHESIONS N/A 06/11/2012  Procedure: LAPAROSCOPIC LYSIS OF ADHESIONS;  Surgeon: Allena Katz, MD;  Location: [place] [facility];  Service: Gynecology;  Laterality: N/A;  SALPINGOOPHORECTOMY Bilateral 06/11/2012  Procedure: SALPINGO OOPHORECTOMY;  Surgeon: Allena Katz, MD;  Location: [place] [facility];  Service: Gynecology;  Laterality: Bilateral;  No pertinent family history of premature CVA Social History:  reports current alcohol use. She reports that she does not use drugs. No history on file for tobacco use. Allergies: No Known Allergies Medications Prior to Admission Medication Sig Dispense Refill  hydrOXYzine (VISTARIL) 25 MG capsule Take 25 mg by mouth 4 (four) times daily.    sertraline (ZOLOFT) 100 MG tablet Take 100 mg by mouth daily.     Drug Regimen Review Drug regimen was reviewed and remains appropriate with no significant issues identified  Home: Home Living Family/patient expects to be discharged to:: Private residence Living Arrangements: Children, Spouse/significant other Available Help at Discharge: Family Type of Home: House Home Access: Stairs to enter Technical brewer of Steps: 2 (from kitchen to [employer]) Home Layout: One level Bathroom Shower/Tub: Gaffer, Chiropodist: Standard Bathroom Accessibility: Yes Home Equipment: Civil engineer, contracting, Engineer, manufacturing held shower head  Lives With: Family   Functional History: Prior Function Level of Independence: Independent Comments: Pt helps 7 yo mother at baseline, completely independent  Functional Status:  Mobility: Bed Mobility Overal bed mobility: Needs Assistance Bed Mobility: Supine to Sit Rolling: Mod assist Sidelying to sit: Max assist Supine to sit: +2 for physical assistance, +2 for safety/equipment, HOB elevated, Max assist Sit to supine: Max assist, +2 for physical assistance, HOB elevated General bed mobility comments: rolled R with supervision, needed mod  A for prep for L roll and execution. Max A +2 for LE's off EOB and elevation of trunk to upright Transfers Overall transfer level: Needs assistance Equipment used: 2 person hand held assist Transfers: Sit to/from Stand, Stand Pivot Transfers Sit to Stand: Mod assist, +2 physical assistance Stand pivot transfers: Max assist, +2 physical assistance Squat pivot transfers: Mod assist, +2 physical assistance General transfer comment: needed full support R side due to R lean and buckling of R knee. Pt with very good effort to get self to chair, almost hopping on LLE but needed mod/ max A on R during this Ambulation/Gait General Gait Details: R knee buckles with L step    ADL: ADL Overall ADL's : Needs assistance/impaired Eating/Feeding: Minimal assistance Grooming: Moderate assistance, Sitting Upper Body Bathing: Moderate assistance, Sitting Lower Body Bathing: Maximal assistance, Bed level Upper Body Dressing : Maximal assistance, Sitting Lower Body Dressing: Total assistance, Bed level Lower Body Dressing Details (indicate cue type and reason): to don socks Toileting- Clothing Manipulation and Hygiene: Total assistance Toileting - Clothing Manipulation Details (indicate cue type and reason): purewick in place at beginning and end of session Functional mobility during ADLs: Maximal assistance, +2 for physical assistance General ADL Comments: affected by hemiplegia in addition to apraxia and visual deficits  Cognition: Cognition Overall Cognitive Status: Impaired/Different from baseline Arousal/Alertness: Awake/alert Orientation Level: Oriented X4 Attention: Sustained Sustained Attention: Appears intact Memory: Impaired Memory Impairment: Storage deficit, Retrieval deficit, Decreased recall of new information Immediate Memory Recall:  (4/5 immediate and delayed) Awareness: Impaired Awareness Impairment: Emergent impairment Executive Function: Self Monitoring, Self Correcting Self  Monitoring: Impaired Self Monitoring Impairment: Verbal basic Self Correcting: Impaired Self Correcting Impairment: Verbal basic Cognition Arousal/Alertness: Awake/alert Behavior During Therapy: Impulsive Overall Cognitive Status: Impaired/Different from baseline Area of Impairment:  Attention, Following commands, Safety/judgement, Problem solving, Awareness, Memory Orientation Level: Disoriented to, Time Current Attention Level: Sustained Following Commands: Follows one step commands inconsistently Safety/Judgement: Decreased awareness of safety, Decreased awareness of deficits Awareness: Emergent Problem Solving: Slow processing, Decreased initiation, Difficulty sequencing, Requires verbal cues, Requires tactile cues General Comments: pt unable to remember all of her children's ages, was able to name them and where they live. Continued R inattention.  Physical Exam: Blood pressure 128/72, pulse 84, temperature 99 F (37.2 C), temperature source Oral, resp. rate 18, height 5\' 8"  (1.727 m), weight 117.9 kg, SpO2 95 %. Physical Exam Vitals reviewed.  Constitutional:      Appearance: Normal appearance. She is obese.  HENT:     Head: Normocephalic.     Comments: Right periorbital edema and ecchymosis    Right Ear: External ear normal.     Left Ear: External ear normal.     Nose: Nose normal.  Eyes:     General:        Right eye: No discharge.        Left eye: No discharge.     Extraocular Movements: Extraocular movements intact.  Cardiovascular:     Rate and Rhythm: Normal rate and regular rhythm.  Pulmonary:     Effort: Pulmonary effort is normal.     Breath sounds: Normal breath sounds. No stridor.  Abdominal:     General: Abdomen is flat. Bowel sounds are normal. There is no distension.  Musculoskeletal:     Cervical back: Normal range of motion and neck supple.     Comments: No edema or tenderness in extremities  Skin:    General: Skin is warm and dry.     Comments:  Right periorbital edema  Neurological:     Mental Status: She is alert.     Comments: Alert Makes eye contact with examiner.   Follows simple commands.   Provides her name and age.   She does some difficulty differentiating right from left. Motor: RUE/RLE: 0/5 proximal distal Sensation diminished to light touch RUE/RLE  Psychiatric:        Mood and Affect: Mood normal.        Behavior: Behavior normal.    Results for orders placed or performed during the hospital encounter of 12/01/20 (from the past 48 hour(s)) CBC     Status: Abnormal  Collection Time: 12/06/20  3:47 AM Result Value Ref Range  WBC 12.2 (H) 4.0 - 10.5 K/uL  RBC 4.74 3.87 - 5.11 MIL/uL  Hemoglobin 14.4 12.0 - 15.0 g/dL  HCT 43.1 36.0 - 46.0 %  MCV 90.9 80.0 - 100.0 fL  MCH 30.4 26.0 - 34.0 pg  MCHC 33.4 30.0 - 36.0 g/dL  RDW 12.5 11.5 - 15.5 %  Platelets 379 150 - 400 K/uL  nRBC 0.0 0.0 - 0.2 %   Comment: Performed at [facility], [place] [address] [place], [place] [address] Basic metabolic panel     Status: Abnormal  Collection Time: 12/06/20  3:47 AM Result Value Ref Range  Sodium 137 135 - 145 mmol/L  Potassium 3.6 3.5 - 5.1 mmol/L   Comment: DELTA CHECK NOTED  Chloride 100 98 - 111 mmol/L  CO2 25 22 - 32 mmol/L  Glucose, Bld 105 (H) 70 - 99 mg/dL   Comment: Glucose reference range applies only to samples taken after fasting for at least 8 hours.  BUN 20 6 - 20 mg/dL  Creatinine, Ser 0.73 0.44 - 1.00 mg/dL  Calcium 8.9 8.9 - 10.3 mg/dL  GFR, Estimated >60 >60 mL/min   Comment: (NOTE) Calculated using the CKD-EPI Creatinine Equation (2021)   Anion gap 12 5 - 15   Comment: Performed at [facility], [place] [address] [place], [place] [address] CBC     Status: Abnormal  Collection Time: 12/07/20  3:55 AM Result Value Ref Range  WBC 14.1 (H) 4.0 - 10.5 K/uL  RBC 4.64 3.87 - 5.11 MIL/uL  Hemoglobin 14.2 12.0 - 15.0 g/dL  HCT 42.0 36.0 - 46.0 %  MCV 90.5 80.0 - 100.0 fL  MCH 30.6 26.0 -  34.0 pg  MCHC 33.8 30.0 - 36.0 g/dL  RDW 12.8 11.5 - 15.5 %  Platelets 360 150 - 400 K/uL  nRBC 0.0 0.0 - 0.2 %   Comment: Performed at [facility], [place] [address] [place], [place] [address] Basic metabolic panel     Status: Abnormal  Collection Time: 12/07/20  3:55 AM Result Value Ref Range  Sodium 138 135 - 145 mmol/L  Potassium 3.7 3.5 - 5.1 mmol/L  Chloride 102 98 - 111 mmol/L  CO2 26 22 - 32 mmol/L  Glucose, Bld 109 (H) 70 - 99 mg/dL   Comment: Glucose reference range applies only to samples taken after fasting for at least 8 hours.  BUN 25 (H) 6 - 20 mg/dL  Creatinine, Ser 0.74 0.44 - 1.00 mg/dL  Calcium 9.3 8.9 - 10.3 mg/dL  GFR, Estimated >60 >60 mL/min   Comment: (NOTE) Calculated using the CKD-EPI Creatinine Equation (2021)   Anion gap 10 5 - 15   Comment: Performed at [facility] [address] [place],  [address]  No results found.     Medical Problem List and Plan: 1.  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  Antithrombotics: -DVT/anticoagulation:  Pharmaceutical: Heparin  -antiplatelet therapy: N/A 3. Pain Management: As needed meds 4. Mood: Xanax 0.5 mg 3 times daily, BuSpar 5 mg twice daily, hydroxyzine 25 mg 4 times daily  -antipsychotic agents: N/A 5. Neuropsych: This patient is not fully capable of making decisions on her own behalf. 6. Skin/Wound Care: Routine skin checks 7. Fluids/Electrolytes/Nutrition: Routine in and outs  CMP ordered for tomorrow a.m. 8.  Hypertension.  Norvasc 5 mg daily, Cozaar 50 mg twice daily.    Monitor with increased mobility 9.  Hyperlipidemia: Lipitor 10.  COVID-19.  Completed course of remdesivir.  Currently completing course of dexamethasone 11.  Obesity.  BMI 39.52.  Dietary follow-up.  Encourage weight loss 12.  Leukocytosis-likely secondary to  steroids  WBCs 14.1 on 9/20  Afebrile, no signs/symptoms of infection  CBC ordered for tomorrow   Cathlyn Parsons, PA-C 12/07/2020  I have personally performed a face to face diagnostic evaluation, including, but not limited to relevant history and physical exam findings, of this patient and developed relevant assessment and plan.  Additionally, I have reviewed and concur with the physician assistant's documentation above.  Delice Lesch, MD, ABPMR  The patient's status has not changed. Any changes from the pre-admission screening or documentation from the acute chart are noted above.   Delice Lesch, MD, ABPMR

## 2020-12-07 NOTE — Progress Notes (Signed)
Inpatient Rehabilitation Admissions Coordinator   I have insurance approval and CIR bed to admit pt to today. Airborne precautions d/c'd 9/19. I met with patient and daughter at bedside and spoke with spouse by phone. I will make the arrangements to admit today. Acute team and TOC made aware.  Danne Baxter, RN, MSN Rehab Admissions Coordinator [phone] 12/07/2020 2:12 PM

## 2020-12-07 NOTE — IPOC Note (Signed)
Individualized overall Plan of Care [facility]) Patient Details Name: Felicia Chen MRN: [id number] DOB: [birth year]  Admitting Diagnosis: ICH (intracerebral hemorrhage) [facility])  Hospital Problems: Principal Problem:   ICH (intracerebral hemorrhage) ([place]) Active Problems:   Transaminitis   Right hemiplegia ([place])     Functional Problem List: Nursing Bladder, Bowel, Endurance, Medication Management, Pain, Safety, Perception, Sensory  PT Balance, Perception, Behavior, Safety, Sensory, Endurance, Motor  OT Balance, Cognition, Edema, Endurance, Motor, Perception, Safety, Sensory  SLP Cognition  TR         Basic ADL's: OT Eating, Grooming, Bathing, Dressing, Toileting     Advanced  ADL's: OT       Transfers: PT Bed Mobility, Bed to Chair, Teacher, early years/pre, Tub/Shower     Locomotion: PT Ambulation, Emergency planning/management officer, Stairs     Additional Impairments: OT Fuctional Use of Upper Extremity  SLP Communication, Social Cognition comprehension, expression Memory, Awareness  TR      Anticipated Outcomes Item Anticipated Outcome  Self Feeding set up  Swallowing      Basic self-care  Min A  Toileting  Min A   Bathroom Transfers CGA  Bowel/Bladder  min assist  Transfers  min A  Locomotion  supervision w/c, mod A gait  Communication  Min-Supervision A  Cognition  Min- Supervision A  Pain  < 3  Safety/Judgment  min assist and no falls   Therapy Plan: PT Intensity: Minimum of 1-2 x/day ,45 to 90 minutes PT Frequency: 5 out of 7 days PT Duration Estimated Length of Stay: ~3 weeks OT Intensity: Minimum of 1-2 x/day, 45 to 90 minutes OT Frequency: 5 out of 7 days OT Duration/Estimated Length of Stay: 21-24 days SLP Intensity: Minumum of 1-2 x/day, 30 to 90 minutes SLP Frequency: 3 to 5 out of 7 days SLP Duration/Estimated Length of Stay: 3-4 weeks    Team Interventions: Nursing Interventions Patient/Family Education, Bladder Management, Bowel Management,  Disease Management/Prevention, Pain Management, Medication Management, Discharge Planning  PT interventions Ambulation/gait training, Cognitive remediation/compensation, Discharge planning, Functional mobility training, Pain management, DME/adaptive equipment instruction, Psychosocial support, UE/LE Strength taining/ROM, Therapeutic Activities, Visual/perceptual remediation/compensation, Training and development officer, Community reintegration, Disease management/prevention, Neuromuscular re-education, Patient/family education, Skin care/wound management, IT trainer, Therapeutic Exercise, Wheelchair propulsion/positioning, UE/LE Coordination activities  OT Interventions Training and development officer, Discharge planning, Functional electrical stimulation, Pain management, Self Care/advanced ADL retraining, Therapeutic Activities, UE/LE Coordination activities, Visual/perceptual remediation/compensation, Therapeutic Exercise, Skin care/wound managment, Patient/family education, Functional mobility training, Disease mangement/prevention, Cognitive remediation/compensation, Academic librarian, Engineer, drilling, Neuromuscular re-education, Psychosocial support, Splinting/orthotics, UE/LE Strength taining/ROM, Wheelchair propulsion/positioning  SLP Interventions Cognitive remediation/compensation, English as a second language teacher, Functional tasks, Speech/Language facilitation, Internal/external aids, Patient/family education  TR Interventions    SW/CM Interventions Discharge Planning, Psychosocial Support, Patient/Family Education   Barriers to Discharge MD  Medical stability and Weight  Nursing Decreased caregiver support, Home environment access/layout, Incontinence, Lack of/limited family support, Medication compliance Lives in a 1 level home with 2 steps. Lives with spouse and children. Spouse works from home and can provide 24/7 assist at discharge.  PT Inaccessible home environment, Home environment  access/layout, Incontinence    OT Home environment access/layout    SLP      SW       Team Discharge Planning: Destination: PT-Home ,OT- Home , SLP-Home Projected Follow-up: PT-Home health PT, OT-  Home health OT, 24 hour supervision/assistance, SLP-Outpatient SLP, 24 hour supervision/assistance Projected Equipment Needs: PT-Rolling walker with 5" wheels, Wheelchair (measurements), OT- To be determined, SLP-None recommended by SLP Equipment Details:  PT- , OT-  Patient/family involved in discharge planning: PT- Patient,  OT-Patient, Family member/caregiver, SLP-Patient, Family member/caregiver  MD ELOS: 18-21 days. Medical Rehab Prognosis:  Good Assessment: Felicia Chen is a 54 year old right-handed female with history of hyperlipidemia, anxiety, Gilbert's syndrome endometriosis status post TAH/BSO 2014, obesity with BMI 39.5 and recent COVID-19.  Patient lives with spouse.  1 level home 2 steps to entry.  Independent prior to admission provides assistance for her 64 year old mother.  She presented on 12/01/2020 with increasing headache, right side weakness and neglect.  CT angiogram head and neck showed 60 cc left frontal parietal lobar hematoma with regional subarachnoid extension.  No spot sign or discrete vascular lesion underlying the lobar hematoma.  Mild atherosclerosis in the neck.  CT venogram of the head negative for dural venous sinus thrombosis.  MRI showed overall stable size of large left parietal intraparenchymal hematoma with subarachnoid hemorrhage overlying the bilateral cerebral hemispheres and cerebellum.  No evidence of intraventricular extension or hemorrhage.  EEG negative for seizures.  Echocardiogram with ejection fraction of 60-65%, no wall motion abnormality grade 1 diastolic dysfunction.  Patient did experience hypoxic respiratory failure on admission and monitored by critical care medicine.  Admission chemistries unremarkable except potassium 3.4, glucose 130,  alcohol negative, urine drug screen negative, hemoglobin 15.2.  Hypertonic saline initiated.  Placed on Cleviprex for blood pressure control.  Neurology follow-up with conservative care.  Consideration made to repeat CTA in 2 weeks to reevaluate cerebral vasoconstriction versus atherosclerotic stenosis.  Hospital course patient did have a fall while using bedside commode 12/05/2020 sustaining right facial and eyelid bruising with swelling and cranial CT scan showed no progression of the left cerebral hematoma with mild subarachnoid extension.  No hydrocephalus or midline shift.  She was cleared to begin subcutaneous heparin for DVT prophylaxis.  In regards to patient's [id number] she did complete course of Remdesivir and weaned from dexamethasone.  Patient with resulting functional deficits with mobility, transfers, cognition.  Will set goals for Min A with PT/OT/SLP.   Due to the current state of emergency, patients may not be receiving their 3-hours of Medicare-mandated therapy.  See Team Conference Notes for weekly updates to the plan of care

## 2020-12-07 NOTE — TOC Transition Note (Signed)
Transition of Care [facility]) - CM/SW Discharge Note   Patient Details  Name: Felicia Chen MRN: [id number] Date of Birth: 01-08-1966  Transition of Care [facility]) CM/SW Contact:  Pollie Friar, RN Phone Number: 12/07/2020, 2:37 PM   Clinical Narrative:    Patient is discharging to CIR today. CM signing off.   Final next level of care: IP Rehab Facility Barriers to Discharge: No Barriers Identified   Patient Goals and CMS Choice        Discharge Placement                       Discharge Plan and Services                                     Social Determinants of Health (SDOH) Interventions     Readmission Risk Interventions No flowsheet data found.

## 2020-12-07 NOTE — Discharge Summary (Addendum)
Physician Discharge Summary  Felicia Chen HAL:[id number] DOB: 06/30/1965 DOA: 9/14/202572  PCP: Default, Provider, MD  Admit date: 12/01/2020 Discharge date: 12/07/2020  Time spent: 25 minutes  Recommendations for Outpatient Follow-up:  Discharging to [facility] for inpatient rehab and need for intensive therapies to regain function Recommend Chem-12 CBC in about 1 week at rehab To be seen by ambulatory neurology and resume aspirin and antiplatelet agent as per them in the outpatient setting Consider noninfectious for coronavirus  Discharge Diagnoses:  MAIN problem for hospitalization   Left frontoparietal hemorrhage and possible ischemic stroke  Please see below for itemized issues addressed in HOpsital- refer to other progress notes for clarity if needed  Discharge Condition: Improved  Diet recommendation: Regular  Filed Weights   12/01/20 0703  Weight: 117.9 kg    History of present illness:  89 home dwelling f HLD BMI 39.5 moderate obesity anxiety Gilbert syndrome endometriosis status post TAH/BSO 2014 Recent diagnosis COVID-19 Admit code stroke 12/01/2020-diagnosed left frontal parietal intraparenchymal hemorrhage + ischemic stroke left temporal region ubarachnoid hemorrhage cerebral edema [?  Sinus thrombosis related to COVID?] Dense right hemiplegia noted hemineglect right-sided visual loss, ICH score of 1 Also requiring 2 L of oxygen critical care consulted   9/14 hypertonic saline started, Cleviprex drip started, given remdesivir dexamethasone since 9/14 9/15 Cleviprex drip stopped 9/16 hypertonic saline stopped    Hospital Course:  Accidental fall 9/18 with mild periorbital hematoma right side Fall occurred in setting of ambulation CT head performed stat was negative for any intracranial bleed Is stable at this time to go to CIR Left frontoparietal intracranial hemorrhage + ischemic stroke-?  Secondary to sinus thrombosis from COVID although ruled out on MRI  brain No antiplatelet or blood thinner because of intracranial bleed recently-needs outpatient consideration for antiplatelet as per neurology Blood pressure goal below 140 chronically but definitely below 160 because of recent bleed Needs CT angio  OCtober 3rd/4th to look for resolution of cerebral vasoconstriction contraction Hypertension with goal less than 160 Continue Cozaar 50 bid  Norvasc 5mg  still somewhat uncontrolled Can use as needed hydralazine if above 160 Leukocytosis is secondary to steroids probably No work-up at this time Hypoxic respiratory failure on admission Coronavirus 19 infection-mild Probable OSA given habitus and desaturations when sleeping Complete remdesivir 9/18, Decadron discontinued as no respiratory symptoms whatsoever Wean oxygen up in chair and if worsening oxygenation would resume checking COVID labs Needs outpatient bilevel study Anxiety depression Hold sertraline 100 daily  Meds to continue BuSpar, Xanax, hydroxyzine  Discharge Exam: Vitals:   12/07/20 0759 12/07/20 1118  BP: (!) 145/71 128/72  Pulse: 76 84  Resp: 18 18  Temp: 98.4 F (36.9 C) 99 F (37.2 C)  SpO2: 95% 95%    Subj on day of d/c   Awake coherent no distress daughter at the bedside doing some grooming no new issues fevers chills nausea vomiting  General Exam on discharge  EOMI NCAT no pallor no icterus Dense right-sided deficits with weakness and mild right hemineglect power 5/5 left upper and lower extremity movement without any limitation Chest clear no added sound rales rhonchi Abdomen soft no rebound guarding S1-S2 no murmur no rub no gallop  Discharge Instructions   Discharge Instructions     Ambulatory referral to Neurology   Complete by: As directed    Follow up with stroke clinic NP [phone] (Jessica Vanschaick or Cecille Rubin, if both not available, consider Zachery Dauer, or Ahern) at [facility] in about 4 weeks. Thanks.  Diet - low sodium heart healthy   Complete  by: As directed    Increase activity slowly   Complete by: As directed       Allergies as of 12/07/2020   No Known Allergies      Medication List     STOP taking these medications    sertraline 100 MG tablet Commonly known as: ZOLOFT       TAKE these medications    ALPRAZolam 0.5 MG tablet Commonly known as: XANAX Take 1 tablet (0.5 mg total) by mouth 3 (three) times daily.   amLODipine 5 MG tablet Commonly known as: NORVASC Take 1 tablet (5 mg total) by mouth daily. Start taking on: December 08, 2020   atorvastatin 80 MG tablet Commonly known as: LIPITOR Take 1 tablet (80 mg total) by mouth daily. Start taking on: December 08, 2020   busPIRone 5 MG tablet Commonly known as: BUSPAR Take 1 tablet (5 mg total) by mouth 2 (two) times daily.   hydrOXYzine 25 MG capsule Commonly known as: VISTARIL Take 25 mg by mouth 4 (four) times daily.   losartan 50 MG tablet Commonly known as: COZAAR Take 1 tablet (50 mg total) by mouth 2 (two) times daily.       No Known Allergies  Follow-up Information     [facility]. Schedule an appointment as soon as possible for a visit in 1 month(s).   Specialty: Neurology Why: stroke clinic Contact information: [address] [place] [phone]                 The results of significant diagnostics from this hospitalization (including imaging, microbiology, ancillary and laboratory) are listed below for reference.    Significant Diagnostic Studies: CT HEAD WO CONTRAST (5MM)  Result Date: 12/05/2020 CLINICAL DATA:  Cerebral hemorrhage suspected EXAM: CT HEAD WITHOUT CONTRAST TECHNIQUE: Contiguous axial images were obtained from the base of the skull through the vertex without intravenous contrast. COMPARISON:  12/01/2020 FINDINGS: Brain: Size stable left cerebral hematoma centered at the parietal lobe with 5.3 cm craniocaudal span and nearly 3 cm width.  Regional subarachnoid hemorrhage is stable to decreased. Expected vasogenic edema with local mass effect. No intraventricular hemorrhage. No visible infarct or hydrocephalus. Vascular: No hyperdense vessel or unexpected calcification. Skull: Normal. Negative for fracture or focal lesion. Sinuses/Orbits: No acute finding. Other: Soft tissue hematoma lateral to the right orbit. Patchy sinus opacification. IMPRESSION: No progression of the left cerebral hematoma with mild subarachnoid extension. No hydrocephalus or midline shift. Electronically Signed   By: Jorje Guild M.D.   On: 12/05/2020 08:18   MR BRAIN WO CONTRAST  Result Date: 12/01/2020 CLINICAL DATA:  Headache EXAM: MRI HEAD WITHOUT CONTRAST MRV HEAD WITHOUT CONTRAST TECHNIQUE: Multiplanar, multiecho pulse sequences of the brain and surrounding structures were obtained without intravenous contrast. Angiographic images of the intracranial venous structures were obtained using MRV technique without intravenous contrast. COMPARISON:  CT/CTA/CTV head obtained earlier the same day FINDINGS: MR HEAD: Brain: There is a large intraparenchymal hematoma in the left parietal lobe measuring approximately 6.0 cm AP by 4.1 cm TV by 5.6 cm cc, similar in size compared to the prior CT allowing for difference in modality and measurement technique. There is mild FLAIR hyperintensity in the surrounding brain parenchyma. There is scattered subarachnoid blood overlying the bilateral cerebral hemispheres as seen on the prior CT. Sulcal FLAIR hyperintensity in the cerebellar folia is also seen consistent with infratentorial subarachnoid blood. There  is no evidence of intra-articular extension. Mass effect from the hematoma results in regional sulcal effacement and partial effacement of the body of the left lateral ventricle. There is no midline shift. No microhemorrhages are seen on the T2* sequence. There is no evidence of acute infarct. The left temporal lobe parenchyma is  normal in appearance. Vascular: Normal flow voids. Skull and upper cervical spine: Normal marrow signal. Sinuses/Orbits: There is mucosal thickening throughout the ethmoid air cells and layering fluid in the right sphenoid sinus, unchanged. Other: None. MRV HEAD: The superior and inferior sagittal sinuses, straight sinus, transverse sinuses, and sigmoid sinuses are patent. The internal cerebral veins and basal veins of Rosenthal are patent. There is no evidence of venous sinus thrombosis IMPRESSION: 1. Overall stable size of the large left parietal intraparenchymal hematoma with subarachnoid hemorrhage overlying the bilateral cerebral hemispheres and cerebellum. There is mild surrounding edema without midline shift. 2. No evidence of intraventricular extension of hemorrhage. 3. No evidence of venous sinus thrombosis. 4. No acute infarct. The left temporal lobe parenchyma is normal in appearance. Electronically Signed   By: Valetta Mole M.D.   On: 12/01/2020 14:17   DG CHEST PORT 1 VIEW  Result Date: 12/01/2020 CLINICAL DATA:  Altered mental status. EXAM: PORTABLE CHEST 1 VIEW COMPARISON:  None. FINDINGS: Low volume film. Cardiopericardial silhouette is at upper limits of normal for size. There is pulmonary vascular congestion without overt pulmonary edema. Subtle airspace opacity noted at the lung bases. No pleural effusion. Telemetry leads overlie the chest. IMPRESSION: 1. Low volume film with pulmonary vascular congestion. 2. Subtle airspace opacity at the lung bases. Atelectasis versus pneumonia. Electronically Signed   By: Misty Stanley M.D.   On: 12/01/2020 09:46   EEG adult  Result Date: 12/01/2020 Lora Havens, MD     12/01/2020 10:53 AM Patient Name: Felicia Chen MRN: [id number] Epilepsy Attending: Lora Havens Referring Physician/Provider: Parke Poisson Date: 12/01/2020 Duration: 30.25 mins Patient history: 55yo F with left frontoparietal ICH. EEG to evaluate for seizure Level  of alertness: Awake, asleep AEDs during EEG study: None Technical aspects: This EEG study was done with scalp electrodes positioned according to the 10-20 International system of electrode placement. Electrical activity was acquired at a sampling rate of 500Hz  and reviewed with a high frequency filter of 70Hz  and a low frequency filter of 1Hz . EEG data were recorded continuously and digitally stored. Description: No clear posterior dominant rhythm was seen. Sleep was characterized by vertex waves, sleep spindles (12 to 14 Hz), maximal frontocentral region.  EEG showed continuous generalized and lateralized left hemisphere 5 to 6 Hz theta-delta slowing admixed with 15-18Hz  generalized beta activity. Sharp transients were noted in left temporal region. Hyperventilation and photic stimulation were not performed.   ABNORMALITY -Continuous slow, generalized and lateralized left hemisphere IMPRESSION: This study is suggestive of cortical dysfunction in left hemisphere likely secondary to hemorrhage.  Additionally there is moderate diffuse encephalopathy, nonspecific etiology. No seizures or definite epileptiform discharges were seen throughout the recording. If suspicion for interictal activity remains a concern, a prolonged study can be considered. Lora Havens   MR MRV HEAD WO CM  Result Date: 12/01/2020 CLINICAL DATA:  Headache EXAM: MRI HEAD WITHOUT CONTRAST MRV HEAD WITHOUT CONTRAST TECHNIQUE: Multiplanar, multiecho pulse sequences of the brain and surrounding structures were obtained without intravenous contrast. Angiographic images of the intracranial venous structures were obtained using MRV technique without intravenous contrast. COMPARISON:  CT/CTA/CTV head obtained earlier the same  day FINDINGS: MR HEAD: Brain: There is a large intraparenchymal hematoma in the left parietal lobe measuring approximately 6.0 cm AP by 4.1 cm TV by 5.6 cm cc, similar in size compared to the prior CT allowing for difference  in modality and measurement technique. There is mild FLAIR hyperintensity in the surrounding brain parenchyma. There is scattered subarachnoid blood overlying the bilateral cerebral hemispheres as seen on the prior CT. Sulcal FLAIR hyperintensity in the cerebellar folia is also seen consistent with infratentorial subarachnoid blood. There is no evidence of intra-articular extension. Mass effect from the hematoma results in regional sulcal effacement and partial effacement of the body of the left lateral ventricle. There is no midline shift. No microhemorrhages are seen on the T2* sequence. There is no evidence of acute infarct. The left temporal lobe parenchyma is normal in appearance. Vascular: Normal flow voids. Skull and upper cervical spine: Normal marrow signal. Sinuses/Orbits: There is mucosal thickening throughout the ethmoid air cells and layering fluid in the right sphenoid sinus, unchanged. Other: None. MRV HEAD: The superior and inferior sagittal sinuses, straight sinus, transverse sinuses, and sigmoid sinuses are patent. The internal cerebral veins and basal veins of Rosenthal are patent. There is no evidence of venous sinus thrombosis IMPRESSION: 1. Overall stable size of the large left parietal intraparenchymal hematoma with subarachnoid hemorrhage overlying the bilateral cerebral hemispheres and cerebellum. There is mild surrounding edema without midline shift. 2. No evidence of intraventricular extension of hemorrhage. 3. No evidence of venous sinus thrombosis. 4. No acute infarct. The left temporal lobe parenchyma is normal in appearance. Electronically Signed   By: Valetta Mole M.D.   On: 12/01/2020 14:17   CT VENOGRAM HEAD  Result Date: 12/01/2020 CLINICAL DATA:  Cerebral hemorrhage. EXAM: CT VENOGRAM HEAD TECHNIQUE: Venographic phase images of the brain were obtained following the administration of intravenous contrast. Multiplanar reformats and maximum intensity projections were generated.  CONTRAST:  69mL OMNIPAQUE IOHEXOL 350 MG/ML SOLN COMPARISON:  Preceding CTA and noncontrast head CT from the same day FINDINGS: The dural sinuses are diffusely patent with no thrombus or focal stenosis. Paired deep veins are unremarkable. As permitted by subarachnoid blood and local mass effect, no visible cortical vein thrombus. IMPRESSION: Negative for dural venous sinus thrombosis. Electronically Signed   By: Jorje Guild M.D.   On: 12/01/2020 07:47   CT HEAD CODE STROKE WO CONTRAST  Result Date: 12/01/2020 CLINICAL DATA:  Code stroke.  Right-sided weakness EXAM: CT HEAD WITHOUT CONTRAST TECHNIQUE: Contiguous axial images were obtained from the base of the skull through the vertex without intravenous contrast. COMPARISON:  None. FINDINGS: Brain: Acute, high-density hematoma at the left frontal parietal cortex and subjacent white matter which measures 6.1 x 5 x 3.8 cm (nearly 60 cc volume). Regional subarachnoid extension. Mass effect on adjacent structures without midline shift or entrapment. Low-density appearance at the left temporal lobe could be accentuated by streak artifact but is not completely explained by artifact. It is unclear if this would reflect cerebritis, infarction, or other. Vascular: No hyperdense vessel or unexpected calcification. The dural sinuses do not appear hyperdense. No hyperdense cortical veins noted. There is plan to evaluate the dural sinuses at vessel imaging follow-up. Skull: Normal. Negative for fracture or focal lesion. Sinuses/Orbits: No acute finding. Other: Critical Value/emergent results were called by telephone at the time of interpretation on 12/01/2020 at 6:02 am to Dr Curly Shores, who verbally acknowledged these results. ASPECTS [facility] Stroke Program Early CT Score) Not scored in this setting IMPRESSION: 1.  60 cc left frontal parietal lobar hematoma with regional subarachnoid extension. 2. Low-density appearance of the left temporal lobe of indeterminate cause, as  above. Recommend MR characterization when able. Electronically Signed   By: Jorje Guild M.D.   On: 12/01/2020 06:11   VAS [place] TRANSCRANIAL DOPPLER  Result Date: 12/03/2020  Transcranial Doppler Patient Name:  ALYSA DUCA  Date of Exam:   12/02/2020 Medical Rec #: [id number]            Accession #:    [id number] Date of Birth: 26-Jun-1965           Patient Gender: F Patient Age:   1 years Exam Location:  [facility] Procedure:      VAS [place] TRANSCRANIAL DOPPLER Referring Phys: PRAMOD SETHI --------------------------------------------------------------------------------  Indications: Stroke. Comparison Study: No prior study Performing Technologist: Maudry Mayhew MHA, RDMS, RVT, RDCS  Examination Guidelines: A complete evaluation includes B-mode imaging, spectral Doppler, color Doppler, and power Doppler as needed of all accessible portions of each vessel. Bilateral testing is considered an integral part of a complete examination. Limited examinations for reoccurring indications may be performed as noted.  +----------+-------------+----------+-----------+-------+ RIGHT TCD Right VM (cm)Depth (cm)PulsatilityComment +----------+-------------+----------+-----------+-------+ MCA           57.00                 1.17            +----------+-------------+----------+-----------+-------+ ACA          -51.00                 1.40            +----------+-------------+----------+-----------+-------+ Term ICA      30.00                 1.21            +----------+-------------+----------+-----------+-------+ PCA           41.00                 1.18            +----------+-------------+----------+-----------+-------+ Opthalmic     26.00                 1.21            +----------+-------------+----------+-----------+-------+ ICA siphon    30.00                 1.25            +----------+-------------+----------+-----------+-------+ Vertebral    -32.00                  1.43            +----------+-------------+----------+-----------+-------+  +----------+------------+----------+-----------+------------------+ LEFT TCD  Left VM (cm)Depth (cm)Pulsatility     Comment       +----------+------------+----------+-----------+------------------+ MCA          43.00                 1.24                       +----------+------------+----------+-----------+------------------+ ACA                                        Unable to insonate +----------+------------+----------+-----------+------------------+ Term ICA     30.00  1.55                       +----------+------------+----------+-----------+------------------+ PCA          41.00                 1.14                       +----------+------------+----------+-----------+------------------+ Opthalmic    18.00                 1.49                       +----------+------------+----------+-----------+------------------+ ICA siphon   38.00                 1.57                       +----------+------------+----------+-----------+------------------+ Vertebral    -22.00                1.34                       +----------+------------+----------+-----------+------------------+  +------------+------+------------------+             VM cm      Comment       +------------+------+------------------+ Prox Basilar-27.00                   +------------+------+------------------+ Dist Basilar      Unable to insonate +------------+------+------------------+ Summary:  Normal mean flow velocities in majority of identified vessels of anterior and posterior cerebral circulations. No dfinite evidence of vasospasm noted. *See table(s) above for TCD measurements and observations.  Diagnosing physician: Antony Contras MD Electronically signed by Antony Contras MD on 12/03/2020 at 12:50:53 PM.    Final    ECHOCARDIOGRAM LIMITED  Result Date: 12/01/2020    ECHOCARDIOGRAM LIMITED  REPORT   Patient Name:   ANALYCIA KHOKHAR Date of Exam: 12/01/2020 Medical Rec #:  [id number]           Height:       68.0 in Accession #:    [id number]          Weight:       259.9 lb Date of Birth:  1965/10/13          BSA:          2.285 m Patient Age:    38 years            BP:           128/66 mmHg Patient Gender: F                   HR:           102 bpm. Exam Location:  Inpatient Procedure: Limited Echo, Cardiac Doppler, Color Doppler and 3D Echo Indications:    Stroke  History:        Patient has no prior history of Echocardiogram examinations.                 Stroke; Risk Factors:Dyslipidemia. Covid positive.  Sonographer:    Roseanna Rainbow RDCS Referring Phys: [id number] [place]  Sonographer Comments: Technically difficult study due to poor echo windows and patient is morbidly obese. Image acquisition challenging due to patient body habitus. IMPRESSIONS  1. Left ventricular ejection fraction, by estimation, is 60 to 65%. The left  ventricle has normal function. The left ventricle has no regional wall motion abnormalities. There is mild concentric left ventricular hypertrophy. Left ventricular diastolic parameters are consistent with Grade I diastolic dysfunction (impaired relaxation).  2. Right ventricular systolic function is normal. The right ventricular size is normal. There is normal pulmonary artery systolic pressure.  3. The mitral valve is normal in structure. Trivial mitral valve regurgitation. No evidence of mitral stenosis.  4. The aortic valve is tricuspid. Aortic valve regurgitation is not visualized. No aortic stenosis is present.  5. The inferior vena cava is normal in size with greater than 50% respiratory variability, suggesting right atrial pressure of 3 mmHg. FINDINGS  Left Ventricle: Left ventricular ejection fraction, by estimation, is 60 to 65%. The left ventricle has normal function. The left ventricle has no regional wall motion abnormalities. The left ventricular internal cavity  size was normal in size. There is  mild concentric left ventricular hypertrophy. Left ventricular diastolic parameters are consistent with Grade I diastolic dysfunction (impaired relaxation). Right Ventricle: The right ventricular size is normal. No increase in right ventricular wall thickness. Right ventricular systolic function is normal. There is normal pulmonary artery systolic pressure. The tricuspid regurgitant velocity is 1.90 m/s, and  with an assumed right atrial pressure of 3 mmHg, the estimated right ventricular systolic pressure is 09.3 mmHg. Left Atrium: Left atrial size was normal in size. Right Atrium: Right atrial size was normal in size. Pericardium: There is no evidence of pericardial effusion. Mitral Valve: The mitral valve is normal in structure. Trivial mitral valve regurgitation. No evidence of mitral valve stenosis. Tricuspid Valve: The tricuspid valve is normal in structure. Tricuspid valve regurgitation is trivial. No evidence of tricuspid stenosis. Aortic Valve: The aortic valve is tricuspid. Aortic valve regurgitation is not visualized. No aortic stenosis is present. Pulmonic Valve: The pulmonic valve was normal in structure. Pulmonic valve regurgitation is not visualized. No evidence of pulmonic stenosis. Aorta: The aortic root is normal in size and structure. Venous: The inferior vena cava is normal in size with greater than 50% respiratory variability, suggesting right atrial pressure of 3 mmHg. IAS/Shunts: No atrial level shunt detected by color flow Doppler. LEFT VENTRICLE PLAX 2D LVIDd:         4.50 cm LVIDs:         2.40 cm LV PW:         1.02 cm LV IVS:        1.24 cm LVOT diam:     1.70 cm LVOT Area:     2.27 cm  LV Volumes (MOD) LV vol d, MOD A2C: 37.2 ml LV vol d, MOD A4C: 74.1 ml LV vol s, MOD A2C: 6.4 ml LV vol s, MOD A4C: 15.6 ml LV SV MOD A2C:     30.8 ml LV SV MOD A4C:     74.1 ml LV SV MOD BP:      45.2 ml IVC IVC diam: 1.90 cm LEFT ATRIUM         Index LA diam:    3.00  cm 1.31 cm/m   AORTA Ao Root diam: 3.10 cm Ao Asc diam:  3.50 cm MITRAL VALVE               TRICUSPID VALVE MV Area (PHT): 5.34 cm    TR Peak grad:   14.4 mmHg MV Decel Time: 142 msec    TR Vmax:        190.04 cm/s MV E velocity: 89.10 cm/s MV A  velocity: 86.50 cm/s  SHUNTS MV E/A ratio:  1.03        Systemic Diam: 1.70 cm Skeet Latch MD Electronically signed by Skeet Latch MD Signature Date/Time: 12/01/2020/4:08:31 PM    Final    CT ANGIO HEAD NECK W WO CM (CODE STROKE)  Result Date: 12/01/2020 CLINICAL DATA:  Cerebral hemorrhage suspected. History of headache with subsequent weakness EXAM: CT ANGIOGRAPHY HEAD AND NECK TECHNIQUE: Multidetector CT imaging of the head and neck was performed using the standard protocol during bolus administration of intravenous contrast. Multiplanar CT image reconstructions and MIPs were obtained to evaluate the vascular anatomy. Carotid stenosis measurements (when applicable) are obtained utilizing NASCET criteria, using the distal internal carotid diameter as the denominator. CONTRAST:  33mL OMNIPAQUE IOHEXOL 350 MG/ML SOLN COMPARISON:  Head CT from earlier the same day FINDINGS: CTA NECK FINDINGS Aortic arch: Normal arch with 3 vessel branching Right carotid system: Mild mixed density plaque at the bifurcation without stenosis or ulceration. ICA tortuosity without beading. Left carotid system: Mixed density plaque at the bifurcation without stenosis or ulceration. Vertebral arteries: No proximal subclavian stenosis. Codominant vertebral arteries which are widely patent to the dura. Borderline beading of the V4 segments, fibromuscular dysplasia is possible. Skeleton: Negative Other neck: No acute finding Upper chest: Negative Review of the MIP images confirms the above findings CTA HEAD FINDINGS Anterior circulation: Widely patent carotid siphons. Hypoplastic left A1 segment. Segmental narrowings of MCA and ACA branches. No major branch occlusion or proximal flow  limiting stenosis. Negative for aneurysm or vascular malformation. No spot sign at the lobar hematoma on the left. Posterior circulation: Codominant vertebral arteries. Vertebral and basilar arteries are smoothly contoured and widely patent. Segmental narrowing is possible at the PCA branches. Narrow right P1 segment primarily attributed to fetal type circulation. Venous sinuses: Negative, as reported on dedicated CTV Anatomic variants: As above Review of the MIP images confirms the above findings IMPRESSION: 1. No spot sign or discrete vascular lesion underlying the lobar hematoma. 2. 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 3. Mild atherosclerosis in the neck. 4. Tortuous cervical carotid arteries and possible mild cervical vertebral fibromuscular dysplasia. Electronically Signed   By: Jorje Guild M.D.   On: 12/01/2020 07:35    Microbiology: Recent Results (from the past 240 hour(s))  Resp Panel by RT-PCR (Flu A&B, Covid) Nasopharyngeal Swab     Status: Abnormal   Collection Time: 12/01/20  5:50 AM   Specimen: Nasopharyngeal Swab; Nasopharyngeal(NP) swabs in vial transport medium  Result Value Ref Range Status   SARS Coronavirus 2 by RT PCR POSITIVE (A) NEGATIVE Final    Comment: RESULT CALLED TO, READ BACK BY AND VERIFIED WITH: J. DODD RN, AT 1610 12/01/20 D. VANHOOK (NOTE) SARS-CoV-2 target nucleic acids are DETECTED.  The SARS-CoV-2 RNA is generally detectable in upper respiratory specimens during the acute phase of infection. Positive results are indicative of the presence of the identified virus, but do not rule out bacterial infection or co-infection with other pathogens not detected by the test. Clinical correlation with patient history and other diagnostic information is necessary to determine patient infection status. The expected result is Negative.  Fact Sheet for  Patients: EntrepreneurPulse.com.au  Fact Sheet for Healthcare Providers: IncredibleEmployment.be  This test is not yet approved or cleared by the [place] FDA and  has been authorized for detection and/or diagnosis of SARS-CoV-2 by FDA under an Emergency Use Authorization (EUA).  This EUA will remain in  effect (meaning this test c an be used) for the duration of  the COVID-19 declaration under Section 564(b)(1) of the Act, 21 U.S.C. section 360bbb-3(b)(1), unless the authorization is terminated or revoked sooner.     Influenza A by PCR NEGATIVE NEGATIVE Final   Influenza B by PCR NEGATIVE NEGATIVE Final    Comment: (NOTE) The Xpert Xpress SARS-CoV-2/FLU/RSV plus assay is intended as an aid in the diagnosis of influenza from Nasopharyngeal swab specimens and should not be used as a sole basis for treatment. Nasal washings and aspirates are unacceptable for Xpert Xpress SARS-CoV-2/FLU/RSV testing.  Fact Sheet for Patients: EntrepreneurPulse.com.au  Fact Sheet for Healthcare Providers: IncredibleEmployment.be  This test is not yet approved or cleared by the [place] FDA and has been authorized for detection and/or diagnosis of SARS-CoV-2 by FDA under an Emergency Use Authorization (EUA). This EUA will remain in effect (meaning this test can be used) for the duration of the COVID-19 declaration under Section 564(b)(1) of the Act, 21 U.S.C. section 360bbb-3(b)(1), unless the authorization is terminated or revoked.  Performed at [facility], [place] [address], [place], [place] [address]   MRSA Next Gen by PCR, Nasal     Status: None   Collection Time: 12/01/20  8:18 AM   Specimen: Nasal Mucosa; Nasal Swab  Result Value Ref Range Status   MRSA by PCR Next Gen NOT DETECTED NOT DETECTED Final    Comment: (NOTE) The GeneXpert MRSA Assay (FDA approved for NASAL specimens only), is one component of  a comprehensive MRSA colonization surveillance program. It is not intended to diagnose MRSA infection nor to guide or monitor treatment for MRSA infections. Test performance is not FDA approved in patients less than 63 years old. Performed at [facility], [place] [address], [place], [place] [address]      Labs: Basic Metabolic Panel: Recent Labs  Lab 12/03/20 0751 12/03/20 1154 12/03/20 1746 12/04/20 0016 12/05/20 0545 12/06/20 0347 12/07/20 0355  NA 145   < > 144 144  143 139 137 138  K 3.8  --   --  3.4* 4.9 3.6 3.7  CL 114*  --   --  111 105 100 102  CO2 25  --   --  24 21* 25 26  GLUCOSE 125*  --   --  97 90 105* 109*  BUN 13  --   --  14 18 20  25*  CREATININE 0.62  --   --  0.63 0.69 0.73 0.74  CALCIUM 8.5*  --   --  8.5* 8.7* 8.9 9.3  MG 2.1  --   --   --   --   --   --    < > = values in this interval not displayed.   Liver Function Tests: Recent Labs  Lab 12/01/20 0545 12/03/20 0751  AST 29 26  ALT 41 34  ALKPHOS 54 38  BILITOT 1.5* 1.5*  PROT 6.8 6.0*  ALBUMIN 3.8 3.5   No results for input(s): LIPASE, AMYLASE in the last 168 hours. No results for input(s): AMMONIA in the last 168 hours. CBC: Recent Labs  Lab 12/01/20 0545 12/01/20 0552 12/03/20 0751 12/04/20 0016 12/05/20 0628 12/06/20 0347 12/07/20 0355  WBC 7.1  --  10.6* 10.8* 11.5* 12.2* 14.1*  NEUTROABS 4.2  --   --   --  6.3  --   --   HGB 15.2*   < > 13.6 12.8 14.0 14.4 14.2  HCT 44.1   < >  39.9 37.7 41.1 43.1 42.0  MCV 89.6  --  91.3 91.5 90.1 90.9 90.5  PLT 285  --  289 282 326 379 360   < > = values in this interval not displayed.   Cardiac Enzymes: No results for input(s): CKTOTAL, CKMB, CKMBINDEX, TROPONINI in the last 168 hours. BNP: BNP (last 3 results) No results for input(s): BNP in the last 8760 hours.  ProBNP (last 3 results) No results for input(s): PROBNP in the last 8760 hours.  CBG: Recent Labs  Lab 12/01/20 0545  GLUCAP 126*        Signed:  Nita Sells MD   [facility] 12/07/2020, 2:31 PM

## 2020-12-07 NOTE — Progress Notes (Signed)
Patient admitted to floor transported by staff with family by side. Patient denies pain. Patient and family oriented to floor and safety. Bed in lowest position and call bell within reach. Will continue to monitor.

## 2020-12-07 NOTE — H&P (Signed)
Physical Medicine and Rehabilitation Admission H&P    Chief Complaint  Patient presents with   Code Stroke  : HPI: Felicia Chen is a 55 year old right-handed female with history of hyperlipidemia, anxiety, Gilbert's syndrome endometriosis status post TAH/BSO 2014, obesity with BMI 39.5 and recent COVID-19.  History taken from patient and chart review due to cognition patient lives with spouse.  1 level home 2 steps to entry.  Independent prior to admission provides assistance for her 107 year old mother.  She presented on 12/01/2020 with increasing headache, right side weakness and neglect.  CT angiogram head and neck showed 60 cc left frontal parietal lobar hematoma with regional subarachnoid extension.  No spot sign or discrete vascular lesion underlying the lobar hematoma.  Mild atherosclerosis in the neck.  CT venogram of the head negative for dural venous sinus thrombosis.  MRI showed overall stable size of large left parietal intraparenchymal hematoma with subarachnoid hemorrhage overlying the bilateral cerebral hemispheres and cerebellum.  No evidence of intraventricular extension or hemorrhage.  EEG negative for seizures.  Echocardiogram with ejection fraction of 60-65%, no wall motion abnormality grade 1 diastolic dysfunction.  Patient did experience hypoxic respiratory failure on admission and monitored by critical care medicine.  Admission chemistries unremarkable except potassium 3.4, glucose 130, alcohol negative, urine drug screen negative, hemoglobin 15.2.  Hypertonic saline initiated.  Placed on Cleviprex for blood pressure control.  Neurology follow-up with conservative care.  Consideration made to repeat CTA in 2 weeks to reevaluate cerebral vasoconstriction versus atherosclerotic stenosis.  Hospital course patient did have a fall while using bedside commode 12/05/2020 sustaining right facial and eyelid bruising with swelling and cranial CT scan showed no progression of the left  cerebral hematoma with mild subarachnoid extension.  No hydrocephalus or midline shift.  She was cleared to begin subcutaneous heparin for DVT prophylaxis.  In regards to patient's TOIZT-24 she did complete course of Remdesivir and weaned from dexamethasone.  Therapy evaluations completed due to patient's right-sided hemiparesis with sensory deficits was admitted for a comprehensive rehab program.  Please see preadmission assessment from earlier today as well.  Review of Systems  Constitutional:  Negative for chills.       Low-grade fever  HENT:  Negative for hearing loss.   Eyes:  Negative for blurred vision and double vision.  Respiratory:  Positive for cough. Negative for shortness of breath.   Cardiovascular:  Negative for chest pain, palpitations and leg swelling.  Gastrointestinal:  Negative for heartburn, nausea and vomiting.  Genitourinary:  Negative for dysuria, flank pain and hematuria.  Musculoskeletal:  Negative for joint pain and myalgias.  Skin:  Negative for rash.  Neurological:  Positive for sensory change, focal weakness and weakness.  Psychiatric/Behavioral:  Positive for depression.   All other systems reviewed and are negative. Past Medical History:  Diagnosis Date   Anxiety    Gilbert's syndrome    Past Surgical History:  Procedure Laterality Date   ABDOMINAL HYSTERECTOMY N/A 06/11/2012   Procedure: HYSTERECTOMY ABDOMINAL;  Surgeon: Allena Katz, MD;  Location: [facility];  Service: Gynecology;  Laterality: N/A;   CESAREAN SECTION     x 4   CYSTOSCOPY N/A 06/11/2012   Procedure: CYSTOSCOPY;  Surgeon: Allena Katz, MD;  Location: [facility];  Service: Gynecology;  Laterality: N/A;   HERNIA REPAIR     LAPAROSCOPIC ASSISTED VAGINAL HYSTERECTOMY N/A 06/11/2012   Procedure: LAPAROSCOPIC ASSISTED VAGINAL HYSTERECTOMY atttempted;  Surgeon: Allena Katz, MD;  Location:  [facility];  Service: Gynecology;  Laterality: N/A;  Attempted Laparoscopic assisted vaginal  hysterectomy.   LAPAROSCOPIC LYSIS OF ADHESIONS N/A 06/11/2012   Procedure: LAPAROSCOPIC LYSIS OF ADHESIONS;  Surgeon: Allena Katz, MD;  Location: [facility];  Service: Gynecology;  Laterality: N/A;   SALPINGOOPHORECTOMY Bilateral 06/11/2012   Procedure: SALPINGO OOPHORECTOMY;  Surgeon: Allena Katz, MD;  Location: [facility];  Service: Gynecology;  Laterality: Bilateral;   No pertinent family history of premature CVA Social History:  reports current alcohol use. She reports that she does not use drugs. No history on file for tobacco use. Allergies: No Known Allergies Medications Prior to Admission  Medication Sig Dispense Refill   hydrOXYzine (VISTARIL) 25 MG capsule Take 25 mg by mouth 4 (four) times daily.     sertraline (ZOLOFT) 100 MG tablet Take 100 mg by mouth daily.      Drug Regimen Review Drug regimen was reviewed and remains appropriate with no significant issues identified  Home: Home Living Family/patient expects to be discharged to:: Private residence Living Arrangements: Children, Spouse/significant other Available Help at Discharge: Family Type of Home: House Home Access: Stairs to enter Technical brewer of Steps: 2 (from kitchen to [employer]) Home Layout: One level Bathroom Shower/Tub: Gaffer, Chiropodist: Standard Bathroom Accessibility: Yes Home Equipment: Civil engineer, contracting, Engineer, manufacturing held shower head  Lives With: Family   Functional History: Prior Function Level of Independence: Independent Comments: Pt helps 55 yo mother at baseline, completely independent  Functional Status:  Mobility: Bed Mobility Overal bed mobility: Needs Assistance Bed Mobility: Supine to Sit Rolling: Mod assist Sidelying to sit: Max assist Supine to sit: +2 for physical assistance, +2 for safety/equipment, HOB elevated, Max assist Sit to supine: Max assist, +2 for physical assistance, HOB elevated General bed mobility comments: rolled R with supervision,  needed mod A for prep for L roll and execution. Max A +2 for LE's off EOB and elevation of trunk to upright Transfers Overall transfer level: Needs assistance Equipment used: 2 person hand held assist Transfers: Sit to/from Stand, Stand Pivot Transfers Sit to Stand: Mod assist, +2 physical assistance Stand pivot transfers: Max assist, +2 physical assistance Squat pivot transfers: Mod assist, +2 physical assistance General transfer comment: needed full support R side due to R lean and buckling of R knee. Pt with very good effort to get self to chair, almost hopping on LLE but needed mod/ max A on R during this Ambulation/Gait General Gait Details: R knee buckles with L step    ADL: ADL Overall ADL's : Needs assistance/impaired Eating/Feeding: Minimal assistance Grooming: Moderate assistance, Sitting Upper Body Bathing: Moderate assistance, Sitting Lower Body Bathing: Maximal assistance, Bed level Upper Body Dressing : Maximal assistance, Sitting Lower Body Dressing: Total assistance, Bed level Lower Body Dressing Details (indicate cue type and reason): to don socks Toileting- Clothing Manipulation and Hygiene: Total assistance Toileting - Clothing Manipulation Details (indicate cue type and reason): purewick in place at beginning and end of session Functional mobility during ADLs: Maximal assistance, +2 for physical assistance General ADL Comments: affected by hemiplegia in addition to apraxia and visual deficits  Cognition: Cognition Overall Cognitive Status: Impaired/Different from baseline Arousal/Alertness: Awake/alert Orientation Level: Oriented X4 Attention: Sustained Sustained Attention: Appears intact Memory: Impaired Memory Impairment: Storage deficit, Retrieval deficit, Decreased recall of new information Immediate Memory Recall:  (4/5 immediate and delayed) Awareness: Impaired Awareness Impairment: Emergent impairment Executive Function: Self Monitoring, Self  Correcting Self Monitoring: Impaired Self Monitoring Impairment: Verbal  basic Self Correcting: Impaired Self Correcting Impairment: Verbal basic Cognition Arousal/Alertness: Awake/alert Behavior During Therapy: Impulsive Overall Cognitive Status: Impaired/Different from baseline Area of Impairment: Attention, Following commands, Safety/judgement, Problem solving, Awareness, Memory Orientation Level: Disoriented to, Time Current Attention Level: Sustained Following Commands: Follows one step commands inconsistently Safety/Judgement: Decreased awareness of safety, Decreased awareness of deficits Awareness: Emergent Problem Solving: Slow processing, Decreased initiation, Difficulty sequencing, Requires verbal cues, Requires tactile cues General Comments: pt unable to remember all of her children's ages, was able to name them and where they live. Continued R inattention.  Physical Exam: Blood pressure 128/72, pulse 84, temperature 99 F (37.2 C), temperature source Oral, resp. rate 18, height 5\' 8"  (1.727 m), weight 117.9 kg, SpO2 95 %. Physical Exam Vitals reviewed.  Constitutional:      Appearance: Normal appearance. She is obese.  HENT:     Head: Normocephalic.     Comments: Right periorbital edema and ecchymosis    Right Ear: External ear normal.     Left Ear: External ear normal.     Nose: Nose normal.  Eyes:     General:        Right eye: No discharge.        Left eye: No discharge.     Extraocular Movements: Extraocular movements intact.  Cardiovascular:     Rate and Rhythm: Normal rate and regular rhythm.  Pulmonary:     Effort: Pulmonary effort is normal.     Breath sounds: Normal breath sounds. No stridor.  Abdominal:     General: Abdomen is flat. Bowel sounds are normal. There is no distension.  Musculoskeletal:     Cervical back: Normal range of motion and neck supple.     Comments: No edema or tenderness in extremities  Skin:    General: Skin is warm and dry.      Comments: Right periorbital edema  Neurological:     Mental Status: She is alert.     Comments: Alert Makes eye contact with examiner.   Follows simple commands.   Provides her name and age.   She does some difficulty differentiating right from left. Motor: RUE/RLE: 0/5 proximal distal Sensation diminished to light touch RUE/RLE  Psychiatric:        Mood and Affect: Mood normal.        Behavior: Behavior normal.    Results for orders placed or performed during the hospital encounter of 12/01/20 (from the past 48 hour(s))  CBC     Status: Abnormal   Collection Time: 12/06/20  3:47 AM  Result Value Ref Range   WBC 12.2 (H) 4.0 - 10.5 K/uL   RBC 4.74 3.87 - 5.11 MIL/uL   Hemoglobin 14.4 12.0 - 15.0 g/dL   HCT 43.1 36.0 - 46.0 %   MCV 90.9 80.0 - 100.0 fL   MCH 30.4 26.0 - 34.0 pg   MCHC 33.4 30.0 - 36.0 g/dL   RDW 12.5 11.5 - 15.5 %   Platelets 379 150 - 400 K/uL   nRBC 0.0 0.0 - 0.2 %    Comment: Performed at [facility], [place] [address], [place], [place] [address]  Basic metabolic panel     Status: Abnormal   Collection Time: 12/06/20  3:47 AM  Result Value Ref Range   Sodium 137 135 - 145 mmol/L   Potassium 3.6 3.5 - 5.1 mmol/L    Comment: DELTA CHECK NOTED   Chloride 100 98 - 111 mmol/L   CO2 25  22 - 32 mmol/L   Glucose, Bld 105 (H) 70 - 99 mg/dL    Comment: Glucose reference range applies only to samples taken after fasting for at least 8 hours.   BUN 20 6 - 20 mg/dL   Creatinine, Ser 0.73 0.44 - 1.00 mg/dL   Calcium 8.9 8.9 - 10.3 mg/dL   GFR, Estimated >60 >60 mL/min    Comment: (NOTE) Calculated using the CKD-EPI Creatinine Equation (2021)    Anion gap 12 5 - 15    Comment: Performed at [facility] [address] [place], [place] [address]  CBC     Status: Abnormal   Collection Time: 12/07/20  3:55 AM  Result Value Ref Range   WBC 14.1 (H) 4.0 - 10.5 K/uL   RBC 4.64 3.87 - 5.11 MIL/uL   Hemoglobin 14.2 12.0 - 15.0 g/dL   HCT 42.0 36.0 -  46.0 %   MCV 90.5 80.0 - 100.0 fL   MCH 30.6 26.0 - 34.0 pg   MCHC 33.8 30.0 - 36.0 g/dL   RDW 12.8 11.5 - 15.5 %   Platelets 360 150 - 400 K/uL   nRBC 0.0 0.0 - 0.2 %    Comment: Performed at [facility], [place] [address], [place], [place] [address]  Basic metabolic panel     Status: Abnormal   Collection Time: 12/07/20  3:55 AM  Result Value Ref Range   Sodium 138 135 - 145 mmol/L   Potassium 3.7 3.5 - 5.1 mmol/L   Chloride 102 98 - 111 mmol/L   CO2 26 22 - 32 mmol/L   Glucose, Bld 109 (H) 70 - 99 mg/dL    Comment: Glucose reference range applies only to samples taken after fasting for at least 8 hours.   BUN 25 (H) 6 - 20 mg/dL   Creatinine, Ser 0.74 0.44 - 1.00 mg/dL   Calcium 9.3 8.9 - 10.3 mg/dL   GFR, Estimated >60 >60 mL/min    Comment: (NOTE) Calculated using the CKD-EPI Creatinine Equation (2021)    Anion gap 10 5 - 15    Comment: Performed at [facility] [address] [place],  [address]   No results found.     Medical Problem List and Plan: 1.  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  Antithrombotics: -DVT/anticoagulation:  Pharmaceutical: Heparin  -antiplatelet therapy: N/A 3. Pain Management: As needed meds 4. Mood: Xanax 0.5 mg 3 times daily, BuSpar 5 mg twice daily, hydroxyzine 25 mg 4 times daily  -antipsychotic agents: N/A 5. Neuropsych: This patient is not fully capable of making decisions on her own behalf. 6. Skin/Wound Care: Routine skin checks 7. Fluids/Electrolytes/Nutrition: Routine in and outs  CMP ordered for tomorrow a.m. 8.  Hypertension.  Norvasc 5 mg daily, Cozaar 50 mg twice daily.    Monitor with increased mobility 9.  Hyperlipidemia: Lipitor 10.  COVID-19.  Completed course of remdesivir.  Currently completing course of dexamethasone 11.  Obesity.  BMI 39.52.  Dietary  follow-up.  Encourage weight loss 12.  Leukocytosis-likely secondary to steroids  WBCs 14.1 on 9/20  Afebrile, no signs/symptoms of infection  CBC ordered for tomorrow   Cathlyn Parsons, PA-C 12/07/2020  I have personally performed a face to face diagnostic evaluation, including, but not limited to relevant history and physical exam findings, of  this patient and developed relevant assessment and plan.  Additionally, I have reviewed and concur with the physician assistant's documentation above.  Delice Lesch, MD, ABPMR

## 2020-12-08 DIAGNOSIS — G8191 Hemiplegia, unspecified affecting right dominant side: Secondary | ICD-10-CM

## 2020-12-08 DIAGNOSIS — I611 Nontraumatic intracerebral hemorrhage in hemisphere, cortical: Secondary | ICD-10-CM

## 2020-12-08 DIAGNOSIS — I10 Essential (primary) hypertension: Secondary | ICD-10-CM

## 2020-12-08 DIAGNOSIS — R7401 Elevation of levels of liver transaminase levels: Secondary | ICD-10-CM | POA: Diagnosis not present

## 2020-12-08 DIAGNOSIS — D72828 Other elevated white blood cell count: Secondary | ICD-10-CM | POA: Diagnosis not present

## 2020-12-08 LAB — COMPREHENSIVE METABOLIC PANEL
ALT: 73 U/L — ABNORMAL HIGH (ref 0–44)
AST: 28 U/L (ref 15–41)
Albumin: 3.5 g/dL (ref 3.5–5.0)
Alkaline Phosphatase: 51 U/L (ref 38–126)
Anion gap: 11 (ref 5–15)
BUN: 29 mg/dL — ABNORMAL HIGH (ref 6–20)
CO2: 26 mmol/L (ref 22–32)
Calcium: 9.4 mg/dL (ref 8.9–10.3)
Chloride: 102 mmol/L (ref 98–111)
Creatinine, Ser: 0.81 mg/dL (ref 0.44–1.00)
GFR, Estimated: >60 mL/min (ref >60)
Glucose, Bld: 104 mg/dL — ABNORMAL HIGH (ref 70–99)
Potassium: 4 mmol/L (ref 3.5–5.1)
Sodium: 139 mmol/L (ref 135–145)
Total Bilirubin: 1.6 mg/dL — ABNORMAL HIGH (ref 0.3–1.2)
Total Protein: 6.7 g/dL (ref 6.5–8.1)

## 2020-12-08 LAB — CBC WITH DIFFERENTIAL/PLATELET
Abs Immature Granulocytes: 0 10*3/uL (ref 0.00–0.07)
Basophils Absolute: 0.2 10*3/uL — ABNORMAL HIGH (ref 0.0–0.1)
Basophils Relative: 1 %
Eosinophils Absolute: 0 10*3/uL (ref 0.0–0.5)
Eosinophils Relative: 0 %
HCT: 45.3 % (ref 36.0–46.0)
Hemoglobin: 15.5 g/dL — ABNORMAL HIGH (ref 12.0–15.0)
Lymphocytes Relative: 33 %
Lymphs Abs: 5.7 10*3/uL — ABNORMAL HIGH (ref 0.7–4.0)
MCH: 31 pg (ref 26.0–34.0)
MCHC: 34.2 g/dL (ref 30.0–36.0)
MCV: 90.6 fL (ref 80.0–100.0)
Monocytes Absolute: 0.7 10*3/uL (ref 0.1–1.0)
Monocytes Relative: 4 %
Neutro Abs: 10.8 10*3/uL — ABNORMAL HIGH (ref 1.7–7.7)
Neutrophils Relative %: 62 %
Platelets: 365 10*3/uL (ref 150–400)
RBC: 5 MIL/uL (ref 3.87–5.11)
RDW: 12.8 % (ref 11.5–15.5)
WBC: 17.4 10*3/uL — ABNORMAL HIGH (ref 4.0–10.5)
nRBC: 0 % (ref 0.0–0.2)
nRBC: 0 /100 WBC

## 2020-12-08 MED ORDER — CHLORHEXIDINE GLUCONATE CLOTH 2 % EX PADS: 6.0000 | MEDICATED_PAD | Freq: Every day | CUTANEOUS | Status: DC

## 2020-12-08 MED ORDER — CHLORHEXIDINE GLUCONATE CLOTH 2 % EX PADS: 6.0000 | MEDICATED_PAD | Freq: Two times a day (BID) | CUTANEOUS | Status: DC

## 2020-12-08 NOTE — Evaluation (Signed)
Occupational Therapy Assessment and Plan  Patient Details  Name: Felicia Chen MRN: [id number] Date of Birth: 09-03-1965  OT Diagnosis: abnormal posture, cognitive deficits, hemiplegia affecting dominant side, and muscle weakness (generalized) Rehab Potential:   ELOS: 21-24 days   Today's Date: 12/08/2020 OT Individual Time: 4627-0350 OT Individual Time Calculation (min): 60 min     Hospital Problem: Principal Problem:   ICH (intracerebral hemorrhage) ([place]) Active Problems:   Transaminitis   Right hemiplegia ([place])   Past Medical History:  Past Medical History:  Diagnosis Date   Anxiety    Gilbert's syndrome    Past Surgical History:  Past Surgical History:  Procedure Laterality Date   ABDOMINAL HYSTERECTOMY N/A 06/11/2012   Procedure: HYSTERECTOMY ABDOMINAL;  Surgeon: Allena Katz, MD;  Location: [facility];  Service: Gynecology;  Laterality: N/A;   CESAREAN SECTION     x 4   CYSTOSCOPY N/A 06/11/2012   Procedure: CYSTOSCOPY;  Surgeon: Allena Katz, MD;  Location: [facility];  Service: Gynecology;  Laterality: N/A;   HERNIA REPAIR     LAPAROSCOPIC ASSISTED VAGINAL HYSTERECTOMY N/A 06/11/2012   Procedure: LAPAROSCOPIC ASSISTED VAGINAL HYSTERECTOMY atttempted;  Surgeon: Allena Katz, MD;  Location: [facility];  Service: Gynecology;  Laterality: N/A;  Attempted Laparoscopic assisted vaginal hysterectomy.   LAPAROSCOPIC LYSIS OF ADHESIONS N/A 06/11/2012   Procedure: LAPAROSCOPIC LYSIS OF ADHESIONS;  Surgeon: Allena Katz, MD;  Location: [facility];  Service: Gynecology;  Laterality: N/A;   SALPINGOOPHORECTOMY Bilateral 06/11/2012   Procedure: SALPINGO OOPHORECTOMY;  Surgeon: Allena Katz, MD;  Location: [facility];  Service: Gynecology;  Laterality: Bilateral;    Assessment & Plan Clinical Impression: Felicia Chen is a 55 year old right-handed female with history of hyperlipidemia, anxiety, Gilbert's syndrome endometriosis status post TAH/BSO 2014, obesity  with BMI 39.5 and recent COVID-19.  History taken from patient and chart review due to cognition patient lives with spouse.  1 level home 2 steps to entry.  Independent prior to admission provides assistance for her 57 year old mother.  She presented on 12/01/2020 with increasing headache, right side weakness and neglect.  CT angiogram head and neck showed 60 cc left frontal parietal lobar hematoma with regional subarachnoid extension.  No spot sign or discrete vascular lesion underlying the lobar hematoma.  Mild atherosclerosis in the neck.  CT venogram of the head negative for dural venous sinus thrombosis.  MRI showed overall stable size of large left parietal intraparenchymal hematoma with subarachnoid hemorrhage overlying the bilateral cerebral hemispheres and cerebellum.  No evidence of intraventricular extension or hemorrhage.  EEG negative for seizures.  Echocardiogram with ejection fraction of 60-65%, no wall motion abnormality grade 1 diastolic dysfunction.  Patient did experience hypoxic respiratory failure on admission and monitored by critical care medicine.  Admission chemistries unremarkable except potassium 3.4, glucose 130, alcohol negative, urine drug screen negative, hemoglobin 15.2.  Hypertonic saline initiated.  Placed on Cleviprex for blood pressure control.  Neurology follow-up with conservative care.  Consideration made to repeat CTA in 2 weeks to reevaluate cerebral vasoconstriction versus atherosclerotic stenosis.  Hospital course patient did have a fall while using bedside commode 12/05/2020 sustaining right facial and eyelid bruising with swelling and cranial CT scan showed no progression of the left cerebral hematoma with mild subarachnoid extension.  No hydrocephalus or midline shift.  She was cleared to begin subcutaneous heparin for DVT prophylaxis.  In regards to patient's [id number] she did complete course of Remdesivir and weaned from dexamethasone.  Therapy evaluations completed due  to patient's right-sided hemiparesis with sensory deficits was admitted for a comprehensive rehab program.  Please see preadmission assessment from earlier today as Chen. Patient transferred to CIR on 12/07/2020 .    Patient currently requires max with basic self-care skills secondary to muscle weakness, decreased cardiorespiratoy endurance, impaired timing and sequencing, abnormal tone, unbalanced muscle activation, decreased coordination, and decreased motor planning, decreased attention to right and decreased motor planning, decreased initiation, decreased attention, decreased awareness, decreased problem solving, decreased safety awareness, and decreased memory, and decreased sitting balance, decreased standing balance, decreased postural control, hemiplegia, and decreased balance strategies.  Prior to hospitalization, patient could complete BADL and IADL with independent .  Patient will benefit from skilled intervention to decrease level of assist with basic self-care skills and increase independence with basic self-care skills prior to discharge home with care partner.  Anticipate patient will require 24 hour supervision and follow up home health.  OT - End of Session Activity Tolerance: Tolerates 10 - 20 min activity with multiple rests Endurance Deficit: Yes OT Assessment OT Barriers to Discharge: Home environment access/layout OT Patient demonstrates impairments in the following area(s): Balance;Cognition;Endurance;Motor;Edema;Perception;Safety OT Basic ADL's Functional Problem(s): Eating;Grooming;Bathing;Dressing;Toileting OT Transfers Functional Problem(s): Toilet;Tub/Shower OT Additional Impairment(s): Fuctional Use of Upper Extremity OT Plan OT Intensity: Minimum of 1-2 x/day, 45 to 90 minutes OT Frequency: 5 out of 7 days OT Duration/Estimated Length of Stay: 21-24 days OT Treatment/Interventions: Balance/vestibular training;Discharge planning;Functional electrical stimulation;Pain  management;Self Care/advanced ADL retraining;Therapeutic Activities;UE/LE Coordination activities;Visual/perceptual remediation/compensation;Therapeutic Exercise;Skin care/wound managment;Patient/family education;Functional mobility training;Disease mangement/prevention;Cognitive remediation/compensation;Community reintegration;DME/adaptive equipment instruction;Neuromuscular re-education;Psychosocial support;Splinting/orthotics;UE/LE Strength taining/ROM;Wheelchair propulsion/positioning   OT Evaluation Precautions/Restrictions  Precautions Precautions: Fall Precaution Comments: R hemi, inattention. SBP parameters <160 per neuro on acute Restrictions Weight Bearing Restrictions: No Pain Pain Assessment Pain Scale: 0-10 Pain Score: 0-No pain Home Living/Prior Functioning Home Living Family/patient expects to be discharged to:: Private residence Living Arrangements: Children, Spouse/significant other Available Help at Discharge: Family, Available 24 hours/day Type of Home: House Home Layout: Two level Alternate Level Stairs-Number of Steps: pt and spouse stating now planning to go back to their house, 2 story with 1/2 bath on main floor Bathroom Shower/Tub: Walk-in shower, Tub/shower unit (upstairs) Bathroom Accessibility: Yes  Lives With: Family Prior Function Level of Independence: Independent with basic ADLs, Independent with homemaking with ambulation, Independent with gait, Independent with transfers  Able to Take Stairs?: Yes Driving: Yes Comments: Pt helps 15 yo mother at baseline, completely independent Vision Baseline Vision/History: 1 Wears glasses (contacts per chart) Patient Visual Report: No change from baseline Vision Assessment?: Vision impaired- to be further tested in functional context Additional Comments: difficulty smoothly tracking and maintaining target in R field Perception  Perception: Impaired Praxis Praxis: Impaired Praxis Impairment Details: Motor  planning Cognition Overall Cognitive Status: Impaired/Different from baseline Arousal/Alertness: Awake/alert Orientation Level: Person Year:  (unable to answer) Month: September Day of Week: Incorrect Memory: Impaired Memory Impairment: Storage deficit;Retrieval deficit;Decreased recall of new information;Decreased short term memory Immediate Memory Recall: Sock;Blue;Bed Memory Recall Sock: Not able to recall Memory Recall Blue: With Cue Memory Recall Bed: Not able to recall Awareness: Impaired Problem Solving: Impaired Self Monitoring: Impaired Self Correcting: Impaired Behaviors: Impulsive Safety/Judgment: Impaired Sensation Sensation Light Touch: Impaired by gross assessment Coordination Gross Motor Movements are Fluid and Coordinated: No Fine Motor Movements are Fluid and Coordinated: No Coordination and Movement Description: flaccid RUE>RLE, R hemi limiting Finger Nose Finger Test: unable with RUE Motor  Motor Motor: Hemiplegia Motor - Skilled  Clinical Observations: flaccid RUE, some tone in RLE in standing  Trunk/Postural Assessment  Cervical Assessment Cervical Assessment: Exceptions to [facility] Thoracic Assessment Thoracic Assessment: Exceptions to [facility] (rounded shoulders) Lumbar Assessment Lumbar Assessment: Exceptions to [facility] (posterior tilt) Postural Control Postural Control: Deficits on evaluation Trunk Control: delayed and inadequate Protective Responses: delayed and inadequate  Balance Balance Balance Assessed: Yes Static Sitting Balance Static Sitting - Balance Support: Feet supported;Left upper extremity supported Static Sitting - Level of Assistance: 4: Min assist Dynamic Sitting Balance Dynamic Sitting - Balance Support: Feet supported Dynamic Sitting - Level of Assistance: 3: Mod assist Dynamic Sitting - Balance Activities: Lateral lean/weight shifting;Forward lean/weight shifting Static Standing Balance Static Standing - Balance Support: During  functional activity;Bilateral upper extremity supported Static Standing - Level of Assistance: 2: Max assist (with 2nd helper) Extremity/Trunk Assessment RUE Assessment RUE Assessment: Exceptions to [facility] Passive Range of Motion (PROM) Comments: starting to develop tone in shoulder grossly and tricep/bicep, PROM [facility] General Strength Comments: flaccid, 0/5 with some tone noted RUE Body System: Neuro Brunstrum levels for arm and hand: Arm;Hand Brunstrum level for arm: Stage I Presynergy Brunstrum level for hand: Stage I Flaccidity LUE Assessment LUE Assessment: Within Functional Limits  Care Tool Care Tool Self Care Eating    Min A    Oral Care     Min A    Bathing   Body parts bathed by patient: Right arm;Chest;Abdomen;Right upper leg;Left upper leg;Face Body parts bathed by helper: Left arm;Front perineal area;Buttocks;Right lower leg;Left lower leg   Assist Level: Total Assistance - Patient < 25%    Upper Body Dressing(including orthotics)   What is the patient wearing?: Pull over shirt   Assist Level: Maximal Assistance - Patient 25 - 49%    Lower Body Dressing (excluding footwear)   What is the patient wearing?: Pants Assist for lower body dressing: Total Assistance - Patient < 25%    Putting on/Taking off footwear   What is the patient wearing?: Non-skid slipper socks Assist for footwear: Total Assistance - Patient < 25%       Care Tool Toileting Toileting activity   Assist for toileting:  (not assessed at time of eval, foley in place as Chen)     Care Tool Bed Mobility Roll left and right activity   Roll left and right assist level: Moderate Assistance - Patient 50 - 74%    Sit to lying activity   Sit to lying assist level: Maximal Assistance - Patient 25 - 49%    Lying to sitting on side of bed activity   Lying to sitting on side of bed assist level: the ability to move from lying on the back to sitting on the side of the bed with no back support.: Maximal  Assistance - Patient 25 - 49%     Care Tool Transfers Sit to stand transfer   Sit to stand assist level: 2 Helpers    Chair/bed transfer   Chair/bed transfer assist level: Dependent - mechanical lift Charlaine Dalton)     Toilet transfer   Assist Level: Dependent - Patient 0% (stedy)     Care Tool Cognition  Expression of Ideas and Wants Expression of Ideas and Wants: 3. Some difficulty - exhibits some difficulty with expressing needs and ideas (e.g, some words or finishing thoughts) or speech is not clear  Understanding Verbal and Non-Verbal Content Understanding Verbal and Non-Verbal Content: 3. Usually understands - understands most conversations, but misses some part/intent of message. Requires cues at times  to understand   Memory/Recall Ability Memory/Recall Ability : Current season;That he or she is in a hospital/hospital unit   Refer to Care Plan for [place] 1 OT Short Term Goal 1 (Week 1): Pt will perform sit <> stand at LRAD with MOD of 1 OT Short Term Goal 2 (Week 1): Pt will thread BLEs into pants with AE PRN OT Short Term Goal 3 (Week 1): Pt will perform BSC/toilet transfer with MOD of 1 OT Short Term Goal 4 (Week 1): Pt will perform self ROM for RUE with no more than Min cues  Recommendations for other services: None    Skilled Therapeutic Intervention ADL ADL Eating: Not assessed Grooming: Not assessed Upper Body Bathing: Moderate assistance Lower Body Bathing: Maximal assistance Upper Body Dressing: Maximal assistance Lower Body Dressing: Dependent Toileting: Dependent Toilet Transfer: Other (comment) (stedy) Mobility  Bed Mobility Bed Mobility: Rolling Right;Right Sidelying to Sit;Supine to Sit Rolling Right: Moderate Assistance - Patient 50-74% Right Sidelying to Sit: Maximal Assistance - Patient 25-49% Supine to Sit: Maximal Assistance - Patient - Patient 25-49% Transfers Sit to Stand: Maximal Assistance - Patient 25-49% Stand to  Sit: Maximal Assistance - Patient 25-49%  Skilled Interventions: Pt greeted at time of session supine in bed resting with husband present, who remained throughout. Discussion with pt and spouse regarding role and purpose of OT and CIR. Initially provided new schedule as one in room was incorrect.   Focus of session initially on bed mobility rolling R with Mod A and supine > sit Max A. Once EOB, Min fading to CGA for sitting balance. Pt able to perform UB dress Max A with instructions for hemitechniques. LB dressing total A again with hemitechnique. Sit <> stand 2 helpers with husband assisting on L side, therapist blocking R knee and pt able to static stand and minimally weight shift for approx 45 seconds. Returned to sitting and sit <> stands in [place] of 2 for LB dressing and standing tolerance tasks. Sit > supine with Max A for technique and LE management. Alarm on call bell in reach. Note difficulty with short term memory throughout session, inconsistent cognition, and mild impulsiviity.    Discharge Criteria: Patient will be discharged from OT if patient refuses treatment 3 consecutive times without medical reason, if treatment goals not met, if there is a change in medical status, if patient makes no progress towards goals or if patient is discharged from hospital.  The above assessment, treatment plan, treatment alternatives and goals were discussed and mutually agreed upon: by patient and by family  [name] 12/08/2020, 1:00 PM

## 2020-12-08 NOTE — Plan of Care (Signed)
Problem: RH Balance Goal: LTG: Patient will maintain dynamic sitting balance (OT) Description: LTG:  Patient will maintain dynamic sitting balance with assistance during activities of daily living (OT) Flowsheets (Taken 12/08/2020 1722) LTG: Pt will maintain dynamic sitting balance during ADLs with: Supervision/Verbal cueing Goal: LTG Patient will maintain dynamic standing with ADLs (OT) Description: LTG:  Patient will maintain dynamic standing balance with assist during activities of daily living (OT)  Flowsheets (Taken 12/08/2020 1722) LTG: Pt will maintain dynamic standing balance during ADLs with: Contact Guard/Touching assist   Problem: Sit to Stand Goal: LTG:  Patient will perform sit to stand in prep for activites of daily living with assistance level (OT) Description: LTG:  Patient will perform sit to stand in prep for activites of daily living with assistance level (OT) Flowsheets (Taken 12/08/2020 1722) LTG: PT will perform sit to stand in prep for activites of daily living with assistance level: Contact Guard/Touching assist   Problem: RH Eating Goal: LTG Patient will perform eating w/assist, cues/equip (OT) Description: LTG: Patient will perform eating with assist, with/without cues using equipment (OT) Flowsheets (Taken 12/08/2020 1722) LTG: Pt will perform eating with assistance level of: Set up assist    Problem: RH Grooming Goal: LTG Patient will perform grooming w/assist,cues/equip (OT) Description: LTG: Patient will perform grooming with assist, with/without cues using equipment (OT) Flowsheets (Taken 12/08/2020 1722) LTG: Pt will perform grooming with assistance level of: Supervision/Verbal cueing   Problem: RH Bathing Goal: LTG Patient will bathe all body parts with assist levels (OT) Description: LTG: Patient will bathe all body parts with assist levels (OT) Flowsheets (Taken 12/08/2020 1722) LTG: Pt will perform bathing with assistance level/cueing: Minimal  Assistance - Patient > 75%   Problem: RH Dressing Goal: LTG Patient will perform upper body dressing (OT) Description: LTG Patient will perform upper body dressing with assist, with/without cues (OT). Flowsheets (Taken 12/08/2020 1722) LTG: Pt will perform upper body dressing with assistance level of: Supervision/Verbal cueing Goal: LTG Patient will perform lower body dressing w/assist (OT) Description: LTG: Patient will perform lower body dressing with assist, with/without cues in positioning using equipment (OT) Flowsheets (Taken 12/08/2020 1722) LTG: Pt will perform lower body dressing with assistance level of: Minimal Assistance - Patient > 75%   Problem: RH Toileting Goal: LTG Patient will perform toileting task (3/3 steps) with assistance level (OT) Description: LTG: Patient will perform toileting task (3/3 steps) with assistance level (OT)  Flowsheets (Taken 12/08/2020 1722) LTG: Pt will perform toileting task (3/3 steps) with assistance level: Contact Guard/Touching assist   Problem: RH Functional Use of Upper Extremity Goal: LTG Patient will use RT/LT upper extremity as a (OT) Description: LTG: Patient will use right/left upper extremity as a stabilizer/gross assist/diminished/nondominant/dominant level with assist, with/without cues during functional activity (OT) Flowsheets (Taken 12/08/2020 1722) LTG: Use of upper extremity in functional activities: RUE as gross assist level LTG: Pt will use upper extremity in functional activity with assistance level of: Supervision/Verbal cueing   Problem: RH Toilet Transfers Goal: LTG Patient will perform toilet transfers w/assist (OT) Description: LTG: Patient will perform toilet transfers with assist, with/without cues using equipment (OT) Flowsheets (Taken 12/08/2020 1722) LTG: Pt will perform toilet transfers with assistance level of: Contact Guard/Touching assist   Problem: RH Tub/Shower Transfers Goal: LTG Patient will perform  tub/shower transfers w/assist (OT) Description: LTG: Patient will perform tub/shower transfers with assist, with/without cues using equipment (OT) Flowsheets (Taken 12/08/2020 1722) LTG: Pt will perform tub/shower stall transfers with assistance level of: Contact  Guard/Touching assist   Problem: RH Awareness Goal: LTG: Patient will demonstrate awareness during functional activites type of (OT) Description: LTG: Patient will demonstrate awareness during functional activites type of (OT) Flowsheets (Taken 12/08/2020 1722) Patient will demonstrate awareness during functional activites type of: Emergent LTG: Patient will demonstrate awareness during functional activites type of (OT): Supervision

## 2020-12-08 NOTE — Progress Notes (Signed)
[facility] Individual Statement of Services  Patient Name:  Felicia Chen  Date:  12/08/2020  Welcome to the [facility].  Our goal is to provide you with an individualized program based on your diagnosis and situation, designed to meet your specific needs.  With this comprehensive rehabilitation program, you will be expected to participate in at least 3 hours of rehabilitation therapies Monday-Friday, with modified therapy programming on the weekends.  Your rehabilitation program will include the following services:  Physical Therapy (PT), Occupational Therapy (OT), Speech Therapy (ST), 24 hour per day rehabilitation nursing, Therapeutic Recreaction (TR), Neuropsychology, Care Coordinator, Rehabilitation Medicine, Nutrition Services, and Pharmacy Services  Weekly team conferences will be held on Tuesday to discuss your progress.  Your Inpatient Rehabilitation Care Coordinator will talk with you frequently to get your input and to update you on team discussions.  Team conferences with you and your family in attendance may also be held.  Expected length of stay: 21-24 days  Overall anticipated outcome: min assist level  Depending on your progress and recovery, your program may change. Your Inpatient Rehabilitation Care Coordinator will coordinate services and will keep you informed of any changes. Your Inpatient Rehabilitation Care Coordinator's name and contact numbers are listed  below.  The following services may also be recommended but are not provided by the [facility] will be made to provide these services after discharge if needed.  Arrangements include referral to agencies that provide these services.  Your insurance has been verified to be:  [facility] Your primary doctor is:  Felicia Chen  Pertinent  information will be shared with your doctor and your insurance company.  Inpatient Rehabilitation Care Coordinator:  Ovidio Kin, [place] or Emilia Beck  Information discussed with and copy given to patient by: Elease Hashimoto, 12/08/2020, 2:19 PM

## 2020-12-08 NOTE — Evaluation (Signed)
Speech Language Pathology Assessment and Plan  Patient Details  Name: Felicia Chen MRN: [id number] Date of Birth: 1965/07/19  SLP Diagnosis: Speech and Language deficits;Cognitive Impairments  Rehab Potential: Good ELOS: 3-4 weeks    Today's Date: 12/08/2020 SLP Individual Time: 1000-1105 SLP Individual Time Calculation (min): 65 min   Hospital Problem: Principal Problem:   ICH (intracerebral hemorrhage) ([place]) Active Problems:   Transaminitis   Right hemiplegia ([place])  Past Medical History:  Past Medical History:  Diagnosis Date   Anxiety    Gilbert's syndrome    Past Surgical History:  Past Surgical History:  Procedure Laterality Date   ABDOMINAL HYSTERECTOMY N/A 06/11/2012   Procedure: HYSTERECTOMY ABDOMINAL;  Surgeon: Allena Katz, MD;  Location: [facility];  Service: Gynecology;  Laterality: N/A;   CESAREAN SECTION     x 4   CYSTOSCOPY N/A 06/11/2012   Procedure: CYSTOSCOPY;  Surgeon: Allena Katz, MD;  Location: [facility];  Service: Gynecology;  Laterality: N/A;   HERNIA REPAIR     LAPAROSCOPIC ASSISTED VAGINAL HYSTERECTOMY N/A 06/11/2012   Procedure: LAPAROSCOPIC ASSISTED VAGINAL HYSTERECTOMY atttempted;  Surgeon: Allena Katz, MD;  Location: [facility];  Service: Gynecology;  Laterality: N/A;  Attempted Laparoscopic assisted vaginal hysterectomy.   LAPAROSCOPIC LYSIS OF ADHESIONS N/A 06/11/2012   Procedure: LAPAROSCOPIC LYSIS OF ADHESIONS;  Surgeon: Allena Katz, MD;  Location: [facility];  Service: Gynecology;  Laterality: N/A;   SALPINGOOPHORECTOMY Bilateral 06/11/2012   Procedure: SALPINGO OOPHORECTOMY;  Surgeon: Allena Katz, MD;  Location: [facility];  Service: Gynecology;  Laterality: Bilateral;    Assessment / Plan / Recommendation Clinical Impression  Felicia Chen is a 55 year old right-handed female with history of hyperlipidemia, anxiety, Gilbert's syndrome endometriosis status post TAH/BSO 2014, obesity with BMI 39.5 and recent  COVID-19.  History taken from patient and chart review due to cognition patient lives with spouse.  1 level home 2 steps to entry.  Independent prior to admission provides assistance for her 52 year old mother.  She presented on 12/01/2020 with increasing headache, right side weakness and neglect.  CT angiogram head and neck showed 60 cc left frontal parietal lobar hematoma with regional subarachnoid extension.  No spot sign or discrete vascular lesion underlying the lobar hematoma.  Mild atherosclerosis in the neck.  CT venogram of the head negative for dural venous sinus thrombosis.  MRI showed overall stable size of large left parietal intraparenchymal hematoma with subarachnoid hemorrhage overlying the bilateral cerebral hemispheres and cerebellum.  No evidence of intraventricular extension or hemorrhage.  EEG negative for seizures.  Echocardiogram with ejection fraction of 60-65%, no wall motion abnormality grade 1 diastolic dysfunction.  Patient did experience hypoxic respiratory failure on admission and monitored by critical care medicine.  Admission chemistries unremarkable except potassium 3.4, glucose 130, alcohol negative, urine drug screen negative, hemoglobin 15.2.  Hypertonic saline initiated.  Placed on Cleviprex for blood pressure control.  Neurology follow-up with conservative care.  Consideration made to repeat CTA in 2 weeks to reevaluate cerebral vasoconstriction versus atherosclerotic stenosis.  Hospital course patient did have a fall while using bedside commode 12/05/2020 sustaining right facial and eyelid bruising with swelling and cranial CT scan showed no progression of the left cerebral hematoma with mild subarachnoid extension.  No hydrocephalus or midline shift.  She was cleared to begin subcutaneous heparin for DVT prophylaxis.  In regards to patient's [id number] she did complete course of Remdesivir and weaned from dexamethasone.  Therapy evaluations completed due  to patient's right-sided  hemiparesis with sensory deficits was admitted for a comprehensive rehab program.  Please see preadmission assessment from earlier today as well.  Pt presents with mild-moderate higher level expressive aphasia and severe-moderate receptive aphasia, further impacted by cognitive impairments in delayed processing, short term recall retrieval and emergent awareness. Expressive language pt can communicate in sentence/conversation level with minimal assistance, noting anomia and circumlocution. Pt can name objects and function of objects in room, demonstrated simple convergent naming, but severe deficits in divergent naming (named 2 animals (n=>15.) Pt can read at phrase level, with increase difficulty writing with non-dominate left hand ability to trace letters with moderate assistance and write at letter level/dictation with maximal assistance. Receptive language pt can answer yes/no questions, follow 1 step commands with 70% accuracy increasing accuracy with demonstration and written cues. Pt is noted to have increase difficulty with numbers; recall dates, ages, mental math and backwards sequencing.  Pt scored 10/30 on SLUMS (n=>27) indicating severe deficits in short term recall, divergent naming, and basic problem solving; however, appeared cognitive deficits are secondary to cognitive deficits. Further assessment in reading comprehension is needed. Pt expressed no acute changes in speech nor swallow function. Pt would benefit from skilled ST services in order to maximize functional independence and reduce burden of care, likely requiring supervision at discharge with continued skilled ST services.   Skilled Therapeutic Interventions          Skilled ST services focused on language skills. SLP facilitated administration of cognitive linguistic formal assessment and provided education of results. SLP and pt collaborated to set goals for cognitive linguistic needs during length of stay. All questions answered to  satisfaction.  Pt was left in room with call bell within reach and bed alarm set. SLP recommends to continue skilled services.  SLP Assessment  Patient will need skilled [facility] during CIR admission    Recommendations  Patient destination: Home Follow up Recommendations: Outpatient SLP;24 hour supervision/assistance Equipment Recommended: None recommended by SLP    SLP Frequency 3 to 5 out of 7 days   SLP Duration  SLP Intensity  SLP Treatment/Interventions 3-4 weeks  Minumum of 1-2 x/day, 30 to 90 minutes  Cognitive remediation/compensation;Cueing hierarchy;Functional tasks;Speech/Language facilitation;Internal/external aids;Patient/family education    Pain Pain Assessment Pain Scale: 0-10 Pain Score: 0-No pain  Prior Functioning Cognitive/Linguistic Baseline: Within functional limits Type of Home: House  Lives With: Family;Spouse Available Help at Discharge: Family;Available 24 hours/day  SLP Evaluation Cognition Overall Cognitive Status: Impaired/Different from baseline Arousal/Alertness: Awake/alert Orientation Level: Oriented to person;Oriented to place;Disoriented to time;Oriented to situation Year: 2022 Month: September Day of Week: Correct Attention: Sustained Sustained Attention: Appears intact Memory: Impaired Memory Impairment: Retrieval deficit;Decreased recall of new information;Decreased short term memory Immediate Memory Recall: Sock;Blue;Bed Memory Recall Sock: Not able to recall Memory Recall Blue: With Cue Memory Recall Bed: Not able to recall Awareness: Impaired Awareness Impairment: Emergent impairment Problem Solving: Impaired (impacted by language deficits) Problem Solving Impairment: Verbal basic;Functional basic Self Monitoring: Impaired Self Correcting: Impaired Behaviors: Impulsive Safety/Judgment: Impaired  Comprehension Auditory Comprehension Overall Auditory Comprehension: Impaired Yes/No Questions:  Impaired Commands: Impaired One Step Basic Commands: 50-74% accurate Two Step Basic Commands: 25-49% accurate Complex Commands: 0-24% accurate Conversation: Complex EffectiveTechniques: Repetition;Extra processing time Visual Recognition/Discrimination Discrimination: Within Function Limits Reading Comprehension Reading Status: Within funtional limits (only assessed phrase level) Expression Expression Primary Mode of Expression: Verbal Verbal Expression Overall Verbal Expression: Impaired Initiation: No impairment Automatic Speech: Counting;Social Response;Name Level of Generative/Spontaneous  Verbalization: Conversation Repetition: No impairment Naming: Impairment Responsive: 76-100% accurate Confrontation: Within functional limits Convergent: 75-100% accurate Divergent: 0-24% accurate Verbal Errors: Aware of errors Pragmatics: No impairment Written Expression Dominant Hand: Right Written Expression: Exceptions to [facility] Trace Ability: Letter Oral Motor Oral Motor/Sensory Function Overall Oral Motor/Sensory Function: Within functional limits Motor Speech Overall Motor Speech: Appears within functional limits for tasks assessed  Care Tool Care Tool Cognition Ability to hear (with hearing aid or hearing appliances if normally used Ability to hear (with hearing aid or hearing appliances if normally used): 0. Adequate - no difficulty in normal conservation, social interaction, listening to TV   Expression of Ideas and Wants Expression of Ideas and Wants: 3. Some difficulty - exhibits some difficulty with expressing needs and ideas (e.g, some words or finishing thoughts) or speech is not clear   Understanding Verbal and Non-Verbal Content Understanding Verbal and Non-Verbal Content: 3. Usually understands - understands most conversations, but misses some part/intent of message. Requires cues at times to understand  Memory/Recall Ability Memory/Recall Ability : Current season;That  he or she is in a hospital/hospital unit    Short Term Goals: Week 1: SLP Short Term Goal 1 (Week 1): Pt will participate in further language assessment. SLP Short Term Goal 2 (Week 1): Pt will demonstrate ability to follow 1 step directions with 80% accuracy given mod A multimodal cues. SLP Short Term Goal 3 (Week 1): Pt will demonstrate word finding strategies in higher level tasks such as divergent naming with mod A semantic cues. SLP Short Term Goal 4 (Week 1): Pt will demonstrate recall of personal information, as well as daily and novel information with mod A verbal cues. SLP Short Term Goal 5 (Week 1): Pt will demonstrated recall of personal information, as well as daily and novel information with supervision A verbal cues for memory notebook. SLP Short Term Goal 6 (Week 1): Pt will demonstrate self-monitoring verbal and functional errors with mod A verbal cues.  Refer to Care Plan for Long Term Goals  Recommendations for other services: None   Discharge Criteria: Patient will be discharged from SLP if patient refuses treatment 3 consecutive times without medical reason, if treatment goals not met, if there is a change in medical status, if patient makes no progress towards goals or if patient is discharged from hospital.  The above assessment, treatment plan, treatment alternatives and goals were discussed and mutually agreed upon: by patient and by family   Broadus Costilla  [facility] 12/08/2020, 4:48 PM

## 2020-12-08 NOTE — Progress Notes (Signed)
Inpatient Rehabilitation  Patient information reviewed and entered into eRehab system by Marvelle Span M. Rhianne Soman, M.A., CCC/SLP, PPS Coordinator.  Information including medical coding, functional ability and quality indicators will be reviewed and updated through discharge.    

## 2020-12-08 NOTE — Discharge Instructions (Addendum)
Inpatient Rehab Discharge Instructions  Felicia Chen Discharge date and time: No discharge date for patient encounter.   Activities/Precautions/ Functional Status: Activity: activity as tolerated Diet: regular diet Wound Care: Routine skin checks Functional status:  ___ No restrictions     ___ Walk up steps independently ___ 24/7 supervision/assistance   ___ Walk up steps with assistance ___ Intermittent supervision/assistance  ___ Bathe/dress independently ___ Walk with walker     _x__ Bathe/dress with assistance ___ Walk Independently    ___ Shower independently ___ Walk with assistance    ___ Shower with assistance ___ No alcohol     ___ Return to work/school ________  Special Instructions: No driving smoking or alcohol    COMMUNITY REFERRALS UPON DISCHARGE:    Home Health:   PT   OT  SP                Agency: [facility] Phone: [phone]   Medical Equipment/Items Ordered: [facility] MOTION  [place] [phone]                                                   GENERAL COMMUNITY RESOURCES FOR PATIENT/FAMILY: Support Groups: [facility] SUPPORT GROUP 2ND Thursday OF EVERY MONTH SEPT-MAY 6;00-7:00PM [place] [address] PHONE: [phone]   My questions have been answered and I understand these instructions. I will adhere to these goals and the provided educational materials after my discharge from the hospital.  Patient/Caregiver Signature _______________________________ Date __________  Clinician Signature _______________________________________ Date __________  Please bring this form and your medication list with you to all your follow-up doctor's appointments.

## 2020-12-08 NOTE — Evaluation (Signed)
Physical Therapy Assessment and Plan  Patient Details  Name: Felicia Chen MRN: [id number] Date of Birth: [birth year]  PT Diagnosis: Abnormal posture, Abnormality of gait, Difficulty walking, Hemiplegia dominant, Impaired cognition, Impaired sensation, and Muscle weakness Rehab Potential:   ELOS: ~3 weeks   Today's Date: 12/08/2020 PT Individual Time: 1300-1400 PT Individual Time Calculation (min): 60 min    Hospital Problem: Principal Problem:   ICH (intracerebral hemorrhage) ([place]) Active Problems:   Transaminitis   Right hemiplegia ([place])   Past Medical History:  Past Medical History:  Diagnosis Date   Anxiety    Gilbert's syndrome    Past Surgical History:  Past Surgical History:  Procedure Laterality Date   ABDOMINAL HYSTERECTOMY N/A 06/11/2012   Procedure: HYSTERECTOMY ABDOMINAL;  Surgeon: Felicia Katz, MD;  Location: [facility];  Service: Gynecology;  Laterality: N/A;   CESAREAN SECTION     x 4   CYSTOSCOPY N/A 06/11/2012   Procedure: CYSTOSCOPY;  Surgeon: Felicia Katz, MD;  Location: [facility];  Service: Gynecology;  Laterality: N/A;   HERNIA REPAIR     LAPAROSCOPIC ASSISTED VAGINAL HYSTERECTOMY N/A 06/11/2012   Procedure: LAPAROSCOPIC ASSISTED VAGINAL HYSTERECTOMY atttempted;  Surgeon: Felicia Katz, MD;  Location: [facility];  Service: Gynecology;  Laterality: N/A;  Attempted Laparoscopic assisted vaginal hysterectomy.   LAPAROSCOPIC LYSIS OF ADHESIONS N/A 06/11/2012   Procedure: LAPAROSCOPIC LYSIS OF ADHESIONS;  Surgeon: Felicia Katz, MD;  Location: [facility];  Service: Gynecology;  Laterality: N/A;   SALPINGOOPHORECTOMY Bilateral 06/11/2012   Procedure: SALPINGO OOPHORECTOMY;  Surgeon: Felicia Katz, MD;  Location: [facility];  Service: Gynecology;  Laterality: Bilateral;    Assessment & Plan Clinical Impression: Felicia Chen is a 55 year old right-handed female with history of hyperlipidemia, anxiety, Gilbert's syndrome endometriosis status  post TAH/BSO 2014, obesity with BMI 39.5 and recent COVID-19.  History taken from patient and chart review due to cognition patient lives with spouse.  1 level home 2 steps to entry.  Independent prior to admission provides assistance for her 53 year old mother.  She presented on 12/01/2020 with increasing headache, right side weakness and neglect.  CT angiogram head and neck showed 60 cc left frontal parietal lobar hematoma with regional subarachnoid extension.  No spot sign or discrete vascular lesion underlying the lobar hematoma.  Mild atherosclerosis in the neck.  CT venogram of the head negative for dural venous sinus thrombosis.  MRI showed overall stable size of large left parietal intraparenchymal hematoma with subarachnoid hemorrhage overlying the bilateral cerebral hemispheres and cerebellum.  No evidence of intraventricular extension or hemorrhage.  EEG negative for seizures.  Echocardiogram with ejection fraction of 60-65%, no wall motion abnormality grade 1 diastolic dysfunction.  Patient did experience hypoxic respiratory failure on admission and monitored by critical care medicine.  Admission chemistries unremarkable except potassium 3.4, glucose 130, alcohol negative, urine drug screen negative, hemoglobin 15.2.  Hypertonic saline initiated.  Placed on Cleviprex for blood pressure control.  Neurology follow-up with conservative care.  Consideration made to repeat CTA in 2 weeks to reevaluate cerebral vasoconstriction versus atherosclerotic stenosis.  Hospital course patient did have a fall while using bedside commode 12/05/2020 sustaining right facial and eyelid bruising with swelling and cranial CT scan showed no progression of the left cerebral hematoma with mild subarachnoid extension.  No hydrocephalus or midline shift.  She was cleared to begin subcutaneous heparin for DVT prophylaxis.  In regards to patient's [id number] she did complete course of Remdesivir and  weaned from dexamethasone.  Therapy  evaluations completed due to patient's right-sided hemiparesis with sensory deficits was admitted for a comprehensive rehab program.  Please see preadmission assessment from earlier today as well. Patient transferred to CIR on 12/07/2020 .    Patient currently requires max with mobility secondary to muscle weakness, decreased cardiorespiratoy endurance, impaired timing and sequencing, abnormal tone, unbalanced muscle activation, decreased coordination, and decreased motor planning, decreased attention, decreased awareness, decreased problem solving, and decreased memory, and decreased sitting balance, decreased standing balance, decreased postural control, and hemiplegia.  Prior to hospitalization, patient was independent  with mobility and lived with Family, Spouse in a House home.  Home access is 2Stairs to enter.  Patient will benefit from skilled PT intervention to maximize safe functional mobility, minimize fall risk, and decrease caregiver burden for planned discharge home with 24 hour assist.  Anticipate patient will benefit from follow up [facility] at discharge.  PT - End of Session Activity Tolerance: Tolerates 30+ min activity with multiple rests Endurance Deficit: Yes Endurance Deficit Description: Pt requires rest breaks PT Assessment PT Barriers to Discharge: [place] home environment;Home environment access/layout;Incontinence PT Patient demonstrates impairments in the following area(s): Balance;Perception;Behavior;Safety;Sensory;Endurance;Motor PT Transfers Functional Problem(s): Bed Mobility;Bed to Chair;Car PT Locomotion Functional Problem(s): Ambulation;Wheelchair Mobility;Stairs PT Plan PT Intensity: Minimum of 1-2 x/day ,45 to 90 minutes PT Frequency: 5 out of 7 days PT Duration Estimated Length of Stay: ~3 weeks PT Treatment/Interventions: Ambulation/gait training;Cognitive remediation/compensation;Discharge planning;Functional mobility training;Pain management;DME/adaptive  equipment instruction;Psychosocial support;UE/LE Strength taining/ROM;Therapeutic Activities;Visual/perceptual remediation/compensation;Balance/vestibular training;Community reintegration;Disease management/prevention;Neuromuscular re-education;Patient/family education;Skin care/wound management;Stair training;Therapeutic Exercise;Wheelchair propulsion/positioning;UE/LE Coordination activities PT Transfers Anticipated Outcome(s): min A PT Locomotion Anticipated Outcome(s): supervision w/c, mod A gait PT Recommendation Recommendations for Other Services: Therapeutic Recreation consult Therapeutic Recreation Interventions: Stress management Follow Up Recommendations: Home health PT Patient destination: Home Equipment Recommended: Rolling walker with 5" wheels;Wheelchair (measurements)   PT Evaluation Precautions/Restrictions Precautions Precautions: Fall Precaution Comments: R hemi, inattention. SBP parameters <160 per neuro on acute General   Vital SignsTherapy Vitals Temp: 98.7 F (37.1 C) Temp Source: Oral Pulse Rate: 91 Resp: 16 BP: 108/61 Patient Position (if appropriate): Lying Oxygen Therapy SpO2: 95 % O2 Device: Room Air Pain Pain Assessment Pain Score: 0-No pain Pain Interference Pain Interference Pain Effect on Sleep: 1. Rarely or not at all Pain Interference with Therapy Activities: 1. Rarely or not at all Pain Interference with Day-to-Day Activities: 1. Rarely or not at all Home Living/Prior [place] Available Help at Discharge: Family;Available 24 hours/day Type of Home: House Home Access: Stairs to enter [employer] of Steps: 2 Entrance Stairs-Rails: None Home Layout: Two level Alternate Level Stairs-Number of Steps: pt and spouse stating now planning to go back to their house, 2 story with 1/2 bath on main floor, could stay in living area if needed  Lives With: Family;Spouse Prior Function Level of Independence: Independent with  gait;Independent with homemaking with ambulation;Independent with transfers  Able to Take Stairs?: Yes Driving: Yes Comments: Pt helps 87 yo mother at baseline, completely independent Vision/Perception  Perception Perception: Impaired Praxis Praxis: Impaired Praxis Impairment Details: Motor planning  Cognition Overall Cognitive Status: Impaired/Different from baseline Arousal/Alertness: Awake/alert Orientation Level: Oriented to person;Oriented to place;Disoriented to time;Oriented to situation Year: 2022 Month: September Day of Week: Correct Attention: Sustained Sustained Attention: Appears intact Memory: Impaired Memory Impairment: Retrieval deficit;Decreased recall of new information;Decreased short term memory Awareness: Impaired Awareness Impairment: Emergent impairment Problem Solving: Impaired Problem Solving Impairment: Verbal basic;Functional basic Executive Function: Self Monitoring;Self Correcting  Self Monitoring: Impaired Self Monitoring Impairment: Verbal basic Self Correcting: Impaired Self Correcting Impairment: Verbal basic Behaviors: Impulsive Safety/Judgment: Impaired Sensation Sensation Light Touch: Impaired by gross assessment Hot/Cold: Not tested Proprioception: Impaired by gross assessment Stereognosis: Not tested Additional Comments: Impaired proprioception in RLE Coordination Gross Motor Movements are Fluid and Coordinated: No Motor  Motor Motor: Hemiplegia Motor - Skilled Clinical Observations: flaccid RUE, some tone in RLE in standing   Trunk/Postural Assessment  Cervical Assessment Cervical Assessment: Exceptions to [facility] Thoracic Assessment Thoracic Assessment: Exceptions to [facility] (rounded shoulders) Lumbar Assessment Lumbar Assessment: Exceptions to [facility] (posterior tilt) Postural Control Postural Control: Deficits on evaluation Trunk Control: delayed and inadequate Protective Responses: delayed and inadequate  Balance Balance Balance  Assessed: Yes Static Sitting Balance Static Sitting - Balance Support: Feet supported;Left upper extremity supported Static Sitting - Level of Assistance: 4: Min assist Dynamic Sitting Balance Dynamic Sitting - Balance Support: Feet supported Dynamic Sitting - Level of Assistance: 3: Mod assist Dynamic Sitting - Balance Activities: Lateral lean/weight shifting;Forward lean/weight shifting Static Standing Balance Static Standing - Balance Support: During functional activity;Bilateral upper extremity supported Static Standing - Level of Assistance: 2: Max assist (with 2nd helper) Extremity Assessment      RLE Assessment RLE Assessment: Exceptions to [facility] General Strength Comments: Grossly 2/5 LLE Assessment LLE Assessment: Exceptions to [facility] General Strength Comments: Grossly 4/5  Care Tool Care Tool Bed Mobility Roll left and right activity   Roll left and right assist level: Moderate Assistance - Patient 50 - 74%    Sit to lying activity   Sit to lying assist level: Maximal Assistance - Patient 25 - 49%    Lying to sitting on side of bed activity   Lying to sitting on side of bed assist level: the ability to move from lying on the back to sitting on the side of the bed with no back support.: Maximal Assistance - Patient 25 - 49%     Care Tool Transfers Sit to stand transfer   Sit to stand assist level: 2 Helpers    Chair/bed transfer   Chair/bed transfer assist level: Dependent - Librarian, academic transfer activity did not occur: Safety/medical concerns        Care Tool Locomotion Ambulation Ambulation activity did not occur: Safety/medical concerns        Walk 10 feet activity Walk 10 feet activity did not occur: Safety/medical concerns       Walk 50 feet with 2 turns activity Walk 50 feet with 2 turns activity did not occur: Safety/medical concerns      Walk 150 feet activity Walk 150 feet activity did not occur:  Safety/medical concerns      Walk 10 feet on uneven surfaces activity Walk 10 feet on uneven surfaces activity did not occur: Safety/medical concerns      Stairs Stair activity did not occur: Safety/medical concerns        Walk up/down 1 step activity Walk up/down 1 step or curb (drop down) activity did not occur: Safety/medical concerns     Walk up/down 4 steps activity did not occuR: Safety/medical concerns  Walk up/down 4 steps activity      Walk up/down 12 steps activity Walk up/down 12 steps activity did not occur: Safety/medical concerns      Pick up small objects from floor Pick up small object from the floor (from standing position) activity did not occur:  Safety/medical concerns      Wheelchair Is the patient using a wheelchair?: No   Wheelchair activity did not occur: Safety/medical concerns      Wheel 50 feet with 2 turns activity Wheelchair 50 feet with 2 turns activity did not occur: Safety/medical concerns    Wheel 150 feet activity Wheelchair 150 feet activity did not occur: Safety/medical concerns      Refer to Care Plan for Long Term Goals  SHORT TERM GOAL WEEK 1 PT Short Term Goal 1 (Week 1): Pt will initiate gait training PT Short Term Goal 2 (Week 1): Pt will perform STS with mod A of 1 PT Short Term Goal 3 (Week 1): Pt will tolerate OOB sitting between therapy sessions  Recommendations for other services: Therapeutic Recreation  Stress management  Skilled Therapeutic Intervention Mobility Bed Mobility Bed Mobility: Rolling Right;Right Sidelying to Sit;Supine to Sit Rolling Right: Moderate Assistance - Patient 50-74% Right Sidelying to Sit: Maximal Assistance - Patient 25-49% Supine to Sit: Maximal Assistance - Patient - Patient 25-49% Transfers Transfers: Sit to Stand;Stand to Sit Sit to Stand: Maximal Assistance - Patient 25-49% Stand to Sit: Maximal Assistance - Patient 25-49% Transfer (Assistive device): Other (Comment) (Stedy) Locomotion   Gait Ambulation: No Gait Gait: No Stairs / Additional Locomotion Stairs: No Wheelchair Mobility Wheelchair Mobility: No  Evaluation completed (see details above and below) with education on PT POC and goals and individual treatment initiated with focus on initiating functional mobility. Sensation and strength testing performed as documented above. Pt required max A for supine<>sit. Sit to stand with stedy min A, scooting at EOB with min A. Pt unable to stand with max A of 1 at this time d/t fatigue. Pt and daughter educated on UE positioning while resting in bed. Pt returned to bed in same manner and was left with all needs in reach and alarm active.    Discharge Criteria: Patient will be discharged from PT if patient refuses treatment 3 consecutive times without medical reason, if treatment goals not met, if there is a change in medical status, if patient makes no progress towards goals or if patient is discharged from hospital.  The above assessment, treatment plan, treatment alternatives and goals were discussed and mutually agreed upon: by patient  Mickel Fuchs 12/08/2020, 7:48 PM

## 2020-12-08 NOTE — Progress Notes (Signed)
Orthopedic Tech Progress Note Patient Details:  Felicia Chen 10-27-1965 [id number]  Called in order to [facility] for a REHAB COMBO.Duanne Guess PRAFO BOOT, [place], WRIST SPLINT  Patient ID: Felicia Chen, female   DOB: 29-Jan-1966, 55 y.o.   MRN: [id number]  Felicia Chen 12/08/2020, 11:51 AM

## 2020-12-08 NOTE — Progress Notes (Signed)
[place] PHYSICAL MEDICINE & REHABILITATION PROGRESS NOTE  Subjective/Complaints: Patient seen sitting up at the edge of the bed this AM, working with therapies.  Fair sitting balance noted.  Family at bedside.  Discussed bracing with therapies, patient, family.  ROS: Denies CP, SOB, N/V/D  Objective: Vital Signs: Blood pressure 124/70, pulse 76, temperature 98.9 F (37.2 C), temperature source Oral, resp. rate 18, height 5' 7.99" (1.727 m), weight 109.4 kg, SpO2 95 %. No results found. Recent Labs    12/07/20 1837 12/08/20 0452  WBC 14.4* 17.4*  HGB 14.8 15.5*  HCT 43.6 45.3  PLT 390 365   Recent Labs    12/07/20 0355 12/07/20 1837 12/08/20 0452  NA 138  --  139  K 3.7  --  4.0  CL 102  --  102  CO2 26  --  26  GLUCOSE 109*  --  104*  BUN 25*  --  29*  CREATININE 0.74 0.94 0.81  CALCIUM 9.3  --  9.4    Intake/Output Summary (Last 24 hours) at 12/08/2020 1021 Last data filed at 12/08/2020 0521 Gross per 24 hour  Intake --  Output 700 ml  Net -700 ml        Physical Exam: BP 124/70   Pulse 76   Temp 98.9 F (37.2 C) (Oral)   Resp 18   Ht 5' 7.99" (1.727 m)   Wt 109.4 kg   SpO2 95%   BMI 36.68 kg/m  Constitutional: No distress . Vital signs reviewed. Obese. HENT: Normocephalic.  Right periorbital edema and ecchymosis. Eyes: EOMI. No discharge. Cardiovascular: No JVD.  RRR. Respiratory: Normal effort.  No stridor.  Bilateral clear to auscultation. GI: Non-distended.  BS +. Skin: Warm and dry.  Right periorbital edema.  Psych: Normal mood.  Normal behavior. Musc: No edema in extremities.  No tenderness in extremities. Neuro: Alert Makes eye contact with examiner.   Follows simple commands.   Provides her name and age.   She does some difficulty differentiating right from left. Motor: RUE/RLE: 0/5 proximal distal, unchanged Sensation diminished to light touch RUE/RLE   Assessment/Plan: 1. Functional deficits which require 3+ hours per day of  interdisciplinary therapy in a comprehensive inpatient rehab setting. Physiatrist is providing close team supervision and 24 hour management of active medical problems listed below. Physiatrist and rehab team continue to assess barriers to discharge/monitor patient progress toward functional and medical goals   Care Tool:  Bathing              Bathing assist       Upper Body Dressing/Undressing Upper body dressing   What is the patient wearing?: Hospital gown only    Upper body assist Assist Level: Maximal Assistance - Patient 25 - 49%    Lower Body Dressing/Undressing Lower body dressing            Lower body assist       Toileting Toileting    Toileting assist Assist for toileting: 2 Helpers     Transfers Chair/bed transfer  Transfers assist     Chair/bed transfer assist level: 2 Helpers     Locomotion Ambulation   Ambulation assist              Walk 10 feet activity   Assist           Walk 50 feet activity   Assist           Walk 150 feet activity   Assist  Walk 10 feet on uneven surface  activity   Assist           Wheelchair     Assist               Wheelchair 50 feet with 2 turns activity    Assist            Wheelchair 150 feet activity     Assist           Medical Problem List and Plan: 1.  Right side hemiplegia secondary to left frontal lobe ICH and small SAH etiology hypertension versus small vessel event  Begin CIR evaluations  WHO/PRAFO ordered 2.  Antithrombotics: -DVT/anticoagulation:  Pharmaceutical: Heparin             -antiplatelet therapy: N/A 3. Pain Management: As needed meds 4. Mood: Xanax 0.5 mg 3 times daily, BuSpar 5 mg twice daily, hydroxyzine 25 mg 4 times daily             -antipsychotic agents: N/A 5. Neuropsych: This patient is not fully capable of making decisions on her own behalf. 6. Skin/Wound Care: Routine skin checks 7.  Fluids/Electrolytes/Nutrition: Routine in and outs 8.  Hypertension.  Norvasc 5 mg daily, Cozaar 50 mg twice daily.               Monitor with increased mobility 9.  Hyperlipidemia: Lipitor 10.  COVID-19.  Completed course of remdesivir.  Currently completing course of dexamethasone 11.  Obesity.  BMI 39.52.  Dietary follow-up.  Encourage weight loss 12.  Leukocytosis-likely secondary to steroids             WBCs 17.4 on 9/21             Afebrile, no signs/symptoms of infection 13.  Transaminitis  ALT elevated on 9/21, continue to monitor  LOS: 1 days A FACE TO FACE EVALUATION WAS PERFORMED  Cranford Blessinger Lorie Phenix 12/08/2020, 10:21 AM

## 2020-12-08 NOTE — Progress Notes (Signed)
Inpatient Rehabilitation Care Coordinator Assessment and Plan Patient Details  Name: Felicia Chen MRN: [id number] Date of Birth: May 17, 1965  Today's Date: 12/08/2020  Hospital Problems: Principal Problem:   ICH (intracerebral hemorrhage) ([place]) Active Problems:   Transaminitis   Right hemiplegia ([place])  Past Medical History:  Past Medical History:  Diagnosis Date   Anxiety    Gilbert's syndrome    Past Surgical History:  Past Surgical History:  Procedure Laterality Date   ABDOMINAL HYSTERECTOMY N/A 06/11/2012   Procedure: HYSTERECTOMY ABDOMINAL;  Surgeon: Allena Katz, MD;  Location: [facility];  Service: Gynecology;  Laterality: N/A;   CESAREAN SECTION     x 4   CYSTOSCOPY N/A 06/11/2012   Procedure: CYSTOSCOPY;  Surgeon: Allena Katz, MD;  Location: [facility];  Service: Gynecology;  Laterality: N/A;   HERNIA REPAIR     LAPAROSCOPIC ASSISTED VAGINAL HYSTERECTOMY N/A 06/11/2012   Procedure: LAPAROSCOPIC ASSISTED VAGINAL HYSTERECTOMY atttempted;  Surgeon: Allena Katz, MD;  Location: [facility];  Service: Gynecology;  Laterality: N/A;  Attempted Laparoscopic assisted vaginal hysterectomy.   LAPAROSCOPIC LYSIS OF ADHESIONS N/A 06/11/2012   Procedure: LAPAROSCOPIC LYSIS OF ADHESIONS;  Surgeon: Allena Katz, MD;  Location: [facility];  Service: Gynecology;  Laterality: N/A;   SALPINGOOPHORECTOMY Bilateral 06/11/2012   Procedure: SALPINGO OOPHORECTOMY;  Surgeon: Allena Katz, MD;  Location: [facility];  Service: Gynecology;  Laterality: Bilateral;   Social History:  reports that she has never smoked. She has never used smokeless tobacco. She reports current alcohol use. She reports that she does not use drugs.  Family / Support Systems Marital Status: Married Patient Roles: Spouse, Parent, Caregiver Spouse/Significant Other: [name] [phone] Children: [name] [phone] [name] taking a  break after finishing college, Geographical information systems officer in college  at [employer], [employer] at [employer] Other Supports: Friends and neighbors Anticipated Caregiver: husband and daughter-Claire Ability/Limitations of Caregiver: Husband works from home and [place] on break after graduating college Caregiver Availability: 24/7 Family Dynamics: [place] knit family al five children have been home to see pt and will provide support. Pt has a 57 yo Mom who is involved also, but pt was assisting with PTA.  Social History Preferred language: English Religion: Catholic Cultural Background: No issues Education: Medical sales representative - How often do you need to have someone help you when you read instructions, pamphlets, or other written material from your doctor or pharmacy?: Never Writes: Yes Employment Status: Unemployed Public relations account executive Issues: No issues Guardian/Conservator: None-according to MD pt is capable of making her own decisions while here. Will include husband per pt's request due to not always processes what is being discussed   Abuse/Neglect Abuse/Neglect Assessment Can Be Completed: Yes Physical Abuse: Denies Verbal Abuse: Denies Sexual Abuse: Denies Exploitation of patient/patient's resources: Denies Self-Neglect: Denies  Patient response to: Social Isolation - How often do you feel lonely or isolated from those around you?: Never  Emotional Status Pt's affect, behavior and adjustment status: Pt is motivated to do well and is still processing what has happened to her. She is willing to work hard and recover from this. She is the one in charge and directs everyone where they need to be. Thisis hard for her to sit back and let them direct themselves. Recent Psychosocial Issues: other health issues were managed and she had recovered from [place] History: History of anxiety takes medications for this and she finds it helpful. She may benefit from seeing neuro-psych while here for coping.  Substance Abuse History: No  issues  Patient / Family Perceptions, Expectations & Goals Pt/Family understanding of illness & functional limitations: Pt and husband are able to explain her SAH and deficits as a result of this. They talk with the MD and feel their concerns are being addressed and they have a direction for pt's treatment plan. Premorbid pt/family roles/activities: mom, wife, caregiver, friend, church member, neighbor etc Anticipated changes in roles/activities/participation: resume Pt/family expectations/goals: Pt states: " I want to be able to do for myself if I can I do not want to burden my husband or children."  Husband states: " We will do what is needed for her she would [place]."  [employer]: None Premorbid Home Care/DME Agencies: None Transportation available at discharge: Family pt drove PTA Is the patient able to respond to transportation needs?: Yes In the past 12 months, has lack of transportation kept you from medical appointments or from getting medications?: No In the past 12 months, has lack of transportation kept you from meetings, work, or from getting things needed for daily living?: No Resource referrals recommended: Neuropsychology  Discharge Planning Living Arrangements: Children, Spouse/significant other Support Systems: Spouse/significant other, Children, Parent, Other relatives, Friends/neighbors, Church/faith community Type of Residence: Private residence Insurance Resources: Multimedia programmer (specify) Sports administrator) Financial Resources: Family Support Financial Screen Referred: No Living Expenses: Medical laboratory scientific officer Management: Patient, Spouse Does the patient have any problems obtaining your medications?: No Home Management: Self other family members to do while she recovers Patient/Family Preliminary Plans: Return home with husband and daughter-Claire who is taking a break after Production designer, theatre/television/film. The other four children are in college but will be involved when  able to. Care Coordinator Anticipated Follow Up Needs: HH/OP  Clinical Impression Pleasant female who is wanting to work hard and recover from her [place]. Her husband and children are involved and supportive, she will have 24/7 care at discharge if needed. She was caring for her 39 yo Mom but the other members will make sure she has what she needs while pt is recovering. Husband works from home and can assist along with daughter. Await therapy evaluations.  Elease Hashimoto 12/08/2020, 2:17 PM

## 2020-12-09 DIAGNOSIS — I611 Nontraumatic intracerebral hemorrhage in hemisphere, cortical: Secondary | ICD-10-CM | POA: Diagnosis not present

## 2020-12-09 MED ORDER — DEXAMETHASONE 4 MG PO TABS
6.0000 mg | ORAL_TABLET | Freq: Every day | ORAL | Status: AC
  Administered 2020-12-09 – 2020-12-10 (×2): 6 mg via ORAL
  Filled 2020-12-09 (×2): qty 2

## 2020-12-09 NOTE — Progress Notes (Signed)
Physical Therapy Session Note  Patient Details  Name: Felicia Chen MRN: [id number] Date of Birth: [birth year]  Today's Date: 12/09/2020 PT Individual Time: 0800-0903 PT Individual Time Calculation (min): 63 min   Short Term Goals: Week 1:  PT Short Term Goal 1 (Week 1): Pt will initiate gait training PT Short Term Goal 2 (Week 1): Pt will perform STS with mod A of 1 PT Short Term Goal 3 (Week 1): Pt will tolerate OOB sitting between therapy sessions  Skilled Therapeutic Interventions/Progress Updates:    pt received in bed and agreeable to therapy. No complaint of pain. Pt requested to use the bathroom. Bed mobility with mod A and max VC, pt demoes poor motor planning and apraxia BIL. Pt sat EOB with CGA fading to close supervision. Pt reported some dizziness in sitting, therapist educated on waiting for dizziness to resolve before moving. Pt later reported that dizziness did not resolve but that [facility] transfer to commode over toilet with mod A to stand with stedy. Pt then had continent bowel movement on commode. Mild confusion noted while attempting to perform anterior hygiene. Pt then stood with stedy, mod A for dependent posterior hygiene. Upon sitting back on stedy paddles, pt became drowsy and could not maintain upright posture.Therapist used cord in bathroom to call for assistance. After several minutes, NT arrived and assisted getting pt back to bed. Pt did not lose consciousness as she was able to respond to verbal stimuli throughout. RN present to check vitals and assess pt status. Almost immediately after returning to bed, pt became alert and awake. BP in supine 110/65. Pt positioned in bed, talking amicably throughout with no obvious signs of confusion. Pt remained in bed with HOB elevated, BP=109/69. Pt's daughter returned to pt room and therapist shared the events of the session with her. Pt was left with all needs in reach and alarm active with her daughter present.   Therapy  Documentation Precautions:  Precautions Precautions: Fall Precaution Comments: R hemi, inattention. SBP parameters <160 per neuro on acute Restrictions Weight Bearing Restrictions: No    Therapy/Group: Individual Therapy  Mickel Fuchs 12/09/2020, 9:55 AM

## 2020-12-09 NOTE — Progress Notes (Signed)
Speech Language Pathology Daily Session Note  Patient Details  Name: Felicia Chen MRN: [id number] Date of Birth: 1965/11/21  Today's Date: 12/09/2020 SLP Individual Time: 0930-1015 SLP Individual Time Calculation (min): 45 min  Short Term Goals: Week 1: SLP Short Term Goal 1 (Week 1): Pt will participate in further language assessment. SLP Short Term Goal 2 (Week 1): Pt will demonstrate ability to follow 1 step directions with 80% accuracy given mod A multimodal cues. SLP Short Term Goal 3 (Week 1): Pt will demonstrate word finding strategies in higher level tasks such as divergent naming with mod A semantic cues. SLP Short Term Goal 4 (Week 1): Pt will demonstrate recall of personal information, as well as daily and novel information with mod A verbal cues. SLP Short Term Goal 5 (Week 1): Pt will demonstrated recall of personal information, as well as daily and novel information with supervision A verbal cues for memory notebook. SLP Short Term Goal 6 (Week 1): Pt will demonstrate self-monitoring verbal and functional errors with mod A verbal cues.  Skilled Therapeutic Interventions:   Patient seen with daughter present in room for skilled ST session focusing on receptive and expressive language. Patient in bed but fully awake, alert. She named object pictures with 90% accuracy, described verb/action photos with 100% accuracy and no verbal cues. She demonstrated reading comprehension at word level by pointing to corresponding word to match with picture in field of three and was 100% accurate without cues. She required modA for sentence level reading, with SLP blocking off parts of sentence secondary to patient seeming to become distracted by too much information at once. Once she read the sentence correctly, she was then 75% accurate with choosing corresponding picture in field of three. Patient participated in picture describing task (with constraint of not being able to say the object's  name) and required modA verbal and modeling cues. Patient does exhibit awareness and said she was surprised that the stroke affected her language as it has. Patient is very motivated and is expected to make very good progress with continued skilled SLP intervention prior to discharge. Patient left in bed with daughter present in room and all needs in place.  Pain Pain Assessment Pain Scale: 0-10 Pain Score: 0-No pain  Therapy/Group: Individual Therapy  Sonia Baller, MA, CCC-SLP Speech Therapy

## 2020-12-09 NOTE — Progress Notes (Signed)
Occupational Therapy Session Note  Patient Details  Name: Felicia Chen MRN: [id number] Date of Birth: 1966-02-06  Today's Date: 12/09/2020 OT Individual Time: 0277-4128 OT Individual Time Calculation (min): 75 min    Short Term Goals: Week 1:  OT Short Term Goal 1 (Week 1): Pt will perform sit <> stand at LRAD with MOD of 1 OT Short Term Goal 2 (Week 1): Pt will thread BLEs into pants with AE PRN OT Short Term Goal 3 (Week 1): Pt will perform BSC/toilet transfer with MOD of 1 OT Short Term Goal 4 (Week 1): Pt will perform self ROM for RUE with no more than Min cues  Skilled Therapeutic Interventions/Progress Updates:  Patient met lying supine in bed in agreement with OT treatment session. 0/10 pain reported at rest and with activity. Daughter present at bedside reports patient with soft BP and near syncope this a.m. with PT. Knee high TED hose donned. Patient education on precautions with change in position including taking extra time. In long sitting, patient able to thread RLE through pants with external assist. Able to thread LLE without help. Patient able to roll R<>L in supine with assist to hike pants over hips at bedlevel. Education on hemi technique with use of non-affected LE to assist affected RLE toward EOB. Patient able to elevate trunk with HOB elevated, use of rail and external assist. Vitals assessed throughout. Please refer to chart below. Attempted sit to stand from EOB for assessment of BP in standing but patient heavily reliant on use of LUE on RW. Unable to obtain standing BP. Squat-pivot to wc on L with Mod A in prep for seated grooming tasks. Total A for wc mobility to sink surface. Patient required assist to sequence oral hygiene task and to thread toothpaste onto toothbrush. Utilized non-dominant LUE to brush teeth. Noted patient closing eyes several times while seated at sink but patient reports this is due to nausea. Sit to stand in stedy for transfer to recliner. In  perched position patient began swaying with ultimate slump forward. This therapist there to support patient to prevent fall forward. RN and Education officer, museum assisted this Probation officer in getting patient back to bed. Patient diaphoretic. HOB put in reverse trendelenburg, knee high TED's doffed and thigh high TED hose donned. Patient opening eyes for brief periods of time before closing them again. Patient attempting conversation with her daughter stating "I'm ok". RN monitoring BP throughout with SBP in 80's and DBP in 50's. Patient left in this position with RN and daughter present at bedside.  Orthostatic BPs  Supine 121/57  Sitting 116/73  Sitting after 5 min 102/65  Standing Unable  Sitting after 10 min 107/58      Therapy Documentation Precautions:  Precautions Precautions: Fall Precaution Comments: R hemi, inattention. SBP parameters <160 per neuro on acute Restrictions Weight Bearing Restrictions: No General:    Therapy/Group: Individual Therapy  Margarite Vessel R [place]-Davis 12/09/2020, 7:29 AM

## 2020-12-09 NOTE — Progress Notes (Signed)
[facility] PHYSICAL MEDICINE & REHABILITATION PROGRESS NOTE  Subjective/Complaints: Felicia Chen is doing well this morning.  WBC increasing to 17.4 yesterday Dizziness when standing this morning- wearing her TED hose.  ROS: Denies CP, SOB, N/V/D  Objective: Vital Signs: Blood pressure 126/76, pulse 75, temperature 98.1 F (36.7 C), temperature source Oral, resp. rate 18, height 5' 7.99" (1.727 m), weight 109.4 kg, SpO2 94 %. No results found. Recent Labs    12/07/20 1837 12/08/20 0452  WBC 14.4* 17.4*  HGB 14.8 15.5*  HCT 43.6 45.3  PLT 390 365   Recent Labs    12/07/20 0355 12/07/20 1837 12/08/20 0452  NA 138  --  139  K 3.7  --  4.0  CL 102  --  102  CO2 26  --  26  GLUCOSE 109*  --  104*  BUN 25*  --  29*  CREATININE 0.74 0.94 0.81  CALCIUM 9.3  --  9.4    Intake/Output Summary (Last 24 hours) at 12/09/2020 1246 Last data filed at 12/08/2020 2300 Gross per 24 hour  Intake 480 ml  Output 950 ml  Net -470 ml        Physical Exam: BP 126/76 (BP Location: Left Arm)   Pulse 75   Temp 98.1 F (36.7 C) (Oral)   Resp 18   Ht 5' 7.99" (1.727 m)   Wt 109.4 kg   SpO2 94%   BMI 36.68 kg/m  Constitutional: No distress . Vital signs reviewed. Obese.BP 109/69 with head of elevated.  HENT: Normocephalic.  Right periorbital edema and ecchymosis. Eyes: EOMI. No discharge. Cardiovascular: No JVD.  RRR. Respiratory: Normal effort.  No stridor.  Bilateral clear to auscultation. GI: Non-distended.  BS +. Skin: Warm and dry.  Right periorbital edema.  Psych: Normal mood.  Normal behavior. Musc: No edema in extremities.  No tenderness in extremities. Neuro: Alert Makes eye contact with examiner.   Follows simple commands.   Provides her name and age.   She does some difficulty differentiating right from left. Motor: RUE/RLE: 0/5 proximal distal, unchanged Sensation diminished to light touch RUE/RLE   Assessment/Plan: 1. Functional deficits which require 3+  hours per day of interdisciplinary therapy in a comprehensive inpatient rehab setting. Physiatrist is providing close team supervision and 24 hour management of active medical problems listed below. Physiatrist and rehab team continue to assess barriers to discharge/monitor patient progress toward functional and medical goals   Care Tool:  Bathing    Body parts bathed by patient: Right arm, Chest, Abdomen, Right upper leg, Left upper leg, Face   Body parts bathed by helper: Left arm, Front perineal area, Buttocks, Right lower leg, Left lower leg     Bathing assist Assist Level: Total Assistance - Patient < 25%     Upper Body Dressing/Undressing Upper body dressing   What is the patient wearing?: Pull over shirt    Upper body assist Assist Level: Maximal Assistance - Patient 25 - 49%    Lower Body Dressing/Undressing Lower body dressing      What is the patient wearing?: Pants     Lower body assist Assist for lower body dressing: Maximal Assistance - Patient 25 - 49%     Toileting Toileting    Toileting assist Assist for toileting: Minimal Assistance - Patient > 75%     Transfers Chair/bed transfer  Transfers assist     Chair/bed transfer assist level: Minimal Assistance - Patient > 75%     Locomotion Ambulation  Ambulation assist   Ambulation activity did not occur: Safety/medical concerns          Walk 10 feet activity   Assist  Walk 10 feet activity did not occur: Safety/medical concerns        Walk 50 feet activity   Assist Walk 50 feet with 2 turns activity did not occur: Safety/medical concerns         Walk 150 feet activity   Assist Walk 150 feet activity did not occur: Safety/medical concerns         Walk 10 feet on uneven surface  activity   Assist Walk 10 feet on uneven surfaces activity did not occur: Safety/medical concerns         Wheelchair     Assist Is the patient using a wheelchair?: No    Wheelchair activity did not occur: Safety/medical concerns         Wheelchair 50 feet with 2 turns activity    Assist    Wheelchair 50 feet with 2 turns activity did not occur: Safety/medical concerns       Wheelchair 150 feet activity     Assist  Wheelchair 150 feet activity did not occur: Safety/medical concerns        Medical Problem List and Plan: 1.  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  Impaired mobility -DVT/anticoagulation:  Pharmaceutical: Continue Heparin             -antiplatelet therapy: N/A 3. Pain Management: As needed meds 4. Anxiety: Continue Xanax 0.5 mg 3 times daily, BuSpar 5 mg twice daily, hydroxyzine 25 mg 4 times daily             -antipsychotic agents: N/A 5. Neuropsych: This patient is not fully capable of making decisions on her own behalf. 6. Skin/Wound Care: Routine skin checks 7. Fluids/Electrolytes/Nutrition: Routine in and outs 8.  Hypertension.  Norvasc 5 mg daily, Cozaar 50 mg twice daily.               Monitor with increased mobility 9.  Hyperlipidemia: Lipitor 10.  COVID-19.  Completed course of remdesivir.  Currently completing course of dexamethasone 11.  Obesity.  BMI 39.52.  Dietary follow-up.  Encourage weight loss 12.  Leukocytosis-likely secondary to steroids             WBCs 17.4 on 9/21, repeat Monday             Afebrile, no signs/symptoms of infection 13.  Transaminitis  ALT elevated on 9/21, continue to monitor 14. Orthostatic hypotension: continue TEDs, added abdominal binder in case this is not enough.   LOS: 2 days A FACE TO FACE EVALUATION WAS PERFORMED  Izora Ribas 12/09/2020, 12:46 PM

## 2020-12-10 NOTE — Progress Notes (Signed)
Occupational Therapy Session Note  Patient Details  Name: Felicia Chen MRN: [id number] Date of Birth: Nov 11, 1965  Today's Date: 12/11/2020 OT Individual Time: 7829-5621 and 3086-5784 OT Individual Time Calculation (min): 56 min and 28 min   Short Term Goals: Week 1:  OT Short Term Goal 1 (Week 1): Pt will perform sit <> stand at LRAD with MOD of 1 OT Short Term Goal 2 (Week 1): Pt will thread BLEs into pants with AE PRN OT Short Term Goal 3 (Week 1): Pt will perform BSC/toilet transfer with MOD of 1 OT Short Term Goal 4 (Week 1): Pt will perform self ROM for RUE with no more than Min cues  Skilled Therapeutic Interventions/Progress Updates:    Pt greeted in bed, NT assessing her vitals. 02 sats 97% on RA and BP 144/63. Pt willing to try sitting EOB during session. Noted that pt had a difficult time motor planning bed mobility and general movements throughout session, required cues repeated and hand over hand assistance often. Max A for supine<sit. Pt reported feeling a little nauseated but nausea quickly absolved. Worked on sitting balance while donning footwear. After OT donned pts Teds, pt then able to assist donning both Lt and Rt gripper socks. Noted that she had several posterior LOBs, requiring Max A to correct. Even when sitting statically, she would lose her balance backwards and needed cues + assist to sit up again. Trained her daughters on PROM for the Rt hand and arm. Daughters Felicia Chen and Felicia Chen had hands on practice with PROM given supervision from OT. Discussed benefits of ROM including edema mgt and preservation of functional ranges. Pt then reported feeling very nauseated and at her limit, returned to bed with +2 assistance. +2 assist for bed mobility to maximize her comfort. Noted BP was 96/63. Provided her with an antinausea aromatherapy blend and BP reassessed after a few minutes, reading: 107/63. Pt remained in bed at close of session, all needs within reach and bed alarm  set.   2nd Session 1:1 tx (28 min) Pt greeted in bed with no c/o pain. Her daughters were still present and all were agreeable to family education. Pt was just issued a resting hand splint and a nighttime hand splint for her Rt UE. Reviewed with pt and daughters method for doffing/donning splint, splint care including washing/drying, daily skin inspection, and limb positioning while wearing splint. Both daughters had hands on practice doffing and donning both types of splints. Education provided on sleep positioning as well to protect pts hemiplegic side. Handouts provided on pts bulletin board and daughters were informed that they can take handouts home at time of pts d/c. Daughters appreciative. Pt remained in care of NT at close of session for I&O cath.   Therapy Documentation Precautions:  Precautions Precautions: Fall Precaution Comments: R hemi, inattention. SBP parameters <160 per neuro on acute Restrictions Weight Bearing Restrictions: No  Vital Signs: Therapy Vitals Temp: 98.4 F (36.9 C) Temp Source: Oral Pulse Rate: 78 Resp: 14 BP: 114/63 Patient Position (if appropriate): Lying Oxygen Therapy SpO2: 97 % O2 Device: Room Air Pain: Pain Assessment Pain Scale: 0-10 Pain Score: 0-No pain ADL: ADL Eating: Not assessed Grooming: Not assessed Upper Body Bathing: Moderate assistance Lower Body Bathing: Maximal assistance Upper Body Dressing: Maximal assistance Lower Body Dressing: Dependent Toileting: Dependent Toilet Transfer: Other (comment) (stedy)   Therapy/Group: Individual Therapy  Johnn Krasowski A Ulys Favia 12/11/2020, 12:39 PM

## 2020-12-10 NOTE — Progress Notes (Signed)
Physical Therapy Session Note  Patient Details  Name: Felicia Chen MRN: [id number] Date of Birth: 25-May-1965  Today's Date: 12/10/2020 PT Individual Time: 2229-7989 PT Individual Time Calculation (min): 75 min   Short Term Goals: Week 1:  PT Short Term Goal 1 (Week 1): Pt will initiate gait training PT Short Term Goal 2 (Week 1): Pt will perform STS with mod A of 1 PT Short Term Goal 3 (Week 1): Pt will tolerate OOB sitting between therapy sessions  Skilled Therapeutic Interventions/Progress Updates:  Patient supine in bed on entrance to room. Husband present. Patient alert and agreeable to PT session.   Patient with no pain complaint throughout session, however perseverates on intensity of nausea and dizziness from previous day associated with drop in BP and feeling of regression and loss of time in therapy. Education provided to pt re: no regression as she is still moving as she was, merely limited in amount d/t drop in BP. This is not something that she can physically/ mentally control but rather a regulatory process in the body that is not reating appropriately and just needs training. Fortunately, this training is within her range of current mobility. Will just require time.  PRAFO had been donned and worn overnight. Unfortunately was ill fitting as it was donned too loose. Demonstrated to husband and pt proper donning and fit and set up kickstand to promote neutral LE alignment. NT notified to further educate overnight NT re: proper donning. Husband points out WHO and daytime wrist brace that have been dropped off. Deferred education on donning/ fit to upcoming OT sessions this day.   Therapeutic Activity: Bed Mobility: Patient performed supine --> sit initially with Mod A requiring extensive vc/ tc for step-by-step technique. To reach upright seated position on EOB. Max A to complete seated pivot to square self on bed. Pt relates feeling of nausea and dizziness and is returned to  supine with Max A. Orthostatic vitals taken and listed below. Return to sitting position following BP in supine with improved symptoms and no drop in BP. Time in standing yielded drop in BP.   MD present following orthostatic vitals and pt c/o nausea. Discussed with RN use of anti-emetic. RN quickly providing anti-nausea medication during session. Transfers: Patient performed sit<>stand with pull-to-stand technique and Min to Mod A throughout session dependent on level of fatigue. Provided verbal cues for initiation, effort and technique.   Neuromuscular Re-ed: NMR facilitated during session with focus onstanding balance and midline orientation. With improved standing vitals, pt guided in pre-gait training and proprioceptive training re: standing balance during standing bouts. Lateral weight shifting with R knee block while pt questioned re: midline orientation and equal WB over BLE. Pt incorrect re: midline but is able to correctly identify lean to side. Notes pull toward L side for confidence and improved strength over R side. Guided in assisted minisquats in STEDY. Trace to minimal quad activation noted in RLE.  NMR performed for improvements in motor control and coordination, balance, sequencing, judgement, and self confidence/ efficacy in performing all aspects of mobility at highest level of independence.   Patient supine  in bed at end of session with brakes locked, bed alarm set, and all needs within reach.  Pt grateful for gentle session and "easing" back into therapy as pt does not tolerate feeling of nausea well.    Therapy Documentation Precautions:  Precautions Precautions: Fall Precaution Comments: R hemi, inattention. SBP parameters <160 per neuro on acute Restrictions Weight Bearing Restrictions: No  Vital Signs: Therapy Vitals BP (Lying): 136/75, pulse 93 BP (Sitting): 136/79, pulse 97 BP (Standing): 112/71, pulse 106  Pain: Pain Assessment Pain Scale: 0-10 Pain Score: 0-No  pain  Therapy/Group: Individual Therapy  Alger Simons PT, DPT 12/10/2020, 7:04 PM

## 2020-12-10 NOTE — Progress Notes (Signed)
Occupational Therapy Session Note  Patient Details  Name: Felicia Chen MRN: [id number] Date of Birth: [birth year]  Today's Date: 12/10/2020 OT Individual Time: 6789-3810 OT Individual Time Calculation (min): 45 min    Short Term Goals: Week 1:  OT Short Term Goal 1 (Week 1): Pt will perform sit <> stand at LRAD with MOD of 1 OT Short Term Goal 2 (Week 1): Pt will thread BLEs into pants with AE PRN OT Short Term Goal 3 (Week 1): Pt will perform BSC/toilet transfer with MOD of 1 OT Short Term Goal 4 (Week 1): Pt will perform self ROM for RUE with no more than Min cues  Skilled Therapeutic Interventions/Progress Updates:    Pt resting in bed upon arrival with husband present. OT intervention with focus on bed mobility, LB dressing, RUE PROM, education, and activity tolerance. BP supine 148/73; HOB elevated 122/72 with slight s/s. Pt reports increased nausea/dizziness when rolling to Lt in bed with nystagmus noted. No dizziness noted when rolling to Rt. No dizziness reported when sitting up in bed from supine. Referral made for vestibular eval. Pt requires max A for rolling L and mod A for rolling R in bed with max verbal cues for sequencing/technique. Pt dependent for donning pants at bed level. Pt educated on RUE SROM. Pt reamined in bed with all needs within reach and bed alarm activated. Husband present.   Therapy Documentation Precautions:  Precautions Precautions: Fall Precaution Comments: R hemi, inattention. SBP parameters <160 per neuro on acute Restrictions Weight Bearing Restrictions: No   Pain:  Pt denies pain this morning   Therapy/Group: Individual Therapy  Leroy Libman 12/10/2020, 12:06 PM

## 2020-12-10 NOTE — Progress Notes (Signed)
Speech Language Pathology Daily Session Note  Patient Details  Name: Felicia Chen MRN: [id number] Date of Birth: 17-Jul-1965  Today's Date: 12/10/2020 SLP Individual Time: 1950-9326 SLP Individual Time Calculation (min): 35 min  Short Term Goals: Week 1: SLP Short Term Goal 1 (Week 1): Pt will participate in further language assessment. SLP Short Term Goal 2 (Week 1): Pt will demonstrate ability to follow 1 step directions with 80% accuracy given mod A multimodal cues. SLP Short Term Goal 3 (Week 1): Pt will demonstrate word finding strategies in higher level tasks such as divergent naming with mod A semantic cues. SLP Short Term Goal 4 (Week 1): Pt will demonstrate recall of personal information, as well as daily and novel information with mod A verbal cues. SLP Short Term Goal 5 (Week 1): Pt will demonstrated recall of personal information, as well as daily and novel information with supervision A verbal cues for memory notebook. SLP Short Term Goal 6 (Week 1): Pt will demonstrate self-monitoring verbal and functional errors with mod A verbal cues.  Skilled Therapeutic Interventions:   Patient seen with daughter present in room for skilled ST session. Patient able to effectively read words of nouns and describe with initially mod A verbal semantic cues but improving to minA . She was able to read and respond to/follow one step printed directions but required modA verbal cues to actually initiate and perform action requested (click tongue, etc). She was able to name 5 items in a given category with modA verbal cues (semantic, cues to try visualization, more specific categories, etc) to perform. She continues to feel that she is overthinking things and gets anxious which does seem to be occurring. SLP provided patient and daughter with noun cards to continue working on language tasks and recommended working on divergent naming as well. Patient expressed that she hopes she will be able to  fully recover language abilities. She continues to benefit from skilled SLP intervention to maximize cognitive-linguistic abilities prior to discharge. She was left in bed with daughter in room.  Pain Pain Assessment Pain Scale: 0-10 Pain Score: 0-No pain  Therapy/Group: Individual Therapy   Sonia Baller, MA, CCC-SLP Speech Therapy

## 2020-12-10 NOTE — Progress Notes (Signed)
Occupational Therapy Session Note  Patient Details  Name: Felicia Chen MRN: [id number] Date of Birth: 1965-12-23  Today's Date: 12/10/2020 OT Individual Time: 7897-8478 OT Individual Time Calculation (min): 28 min    Short Term Goals: Week 1:  OT Short Term Goal 1 (Week 1): Pt will perform sit <> stand at LRAD with MOD of 1 OT Short Term Goal 2 (Week 1): Pt will thread BLEs into pants with AE PRN OT Short Term Goal 3 (Week 1): Pt will perform BSC/toilet transfer with MOD of 1 OT Short Term Goal 4 (Week 1): Pt will perform self ROM for RUE with no more than Min cues  Skilled Therapeutic Interventions/Progress Updates:    Pt missed therapy at beginning of session 2/2 nursing care (bladder scan). Informed pt that she will have a vestibular eval next week to address dizziness when rolling to Lt. OT intervention with focus on bed mobility, technique, bridging, RUE PROM, and education. Avoided rolling to Lt 2/2 increased dizziness when rolling to Lt. Question motor planning deficits with sitting balance in long sitting in bed. Pt slightly impulsive. Pt remained in bed with bed alarm activated. All needs within reach. Daughter present. Therapy Documentation Precautions:  Precautions Precautions: Fall Precaution Comments: R hemi, inattention. SBP parameters <160 per neuro on acute Restrictions Weight Bearing Restrictions: No General: General OT Amount of Missed Time: 17 Minutes Pain: Pt denies pain this afternoon Therapy/Group: Individual Therapy  Leroy Libman 12/10/2020, 2:44 PM

## 2020-12-10 NOTE — Progress Notes (Signed)
[place] PHYSICAL MEDICINE & REHABILITATION PROGRESS NOTE  Subjective/Complaints: Feeling nauseous this morning On the commode Had BM this morning  ROS: Denies CP, SOB, N/V/D, constipation  Objective: Vital Signs: Blood pressure 132/86, pulse 91, temperature 98.2 F (36.8 C), temperature source Oral, resp. rate (!) 21, height 5' 7.99" (1.727 m), weight 109.4 kg, SpO2 96 %. No results found. Recent Labs    12/07/20 1837 12/08/20 0452  WBC 14.4* 17.4*  HGB 14.8 15.5*  HCT 43.6 45.3  PLT 390 365   Recent Labs    12/07/20 1837 12/08/20 0452  NA  --  139  K  --  4.0  CL  --  102  CO2  --  26  GLUCOSE  --  104*  BUN  --  29*  CREATININE 0.94 0.81  CALCIUM  --  9.4    Intake/Output Summary (Last 24 hours) at 12/10/2020 1219 Last data filed at 12/10/2020 2094 Gross per 24 hour  Intake 222 ml  Output 2810 ml  Net -2588 ml        Physical Exam: BP 132/86 (BP Location: Left Arm)   Pulse 91   Temp 98.2 F (36.8 C) (Oral)   Resp (!) 21   Ht 5' 7.99" (1.727 m)   Wt 109.4 kg   SpO2 96%   BMI 36.68 kg/m  Constitutional: No distress . Vital signs reviewed. Nauseous. Obese.BP 109/69 with head of elevated.  HENT: Normocephalic.  Right periorbital edema and ecchymosis. Eyes: EOMI. No discharge. Cardiovascular: No JVD.  RRR. Respiratory: Normal effort.  No stridor.  Bilateral clear to auscultation. GI: Non-distended.  BS +. Skin: Warm and dry.  Right periorbital edema.  Psych: Normal mood.  Normal behavior. Musc: No edema in extremities.  No tenderness in extremities. Neuro: Alert Makes eye contact with examiner.   Follows simple commands.   Provides her name and age.   She does some difficulty differentiating right from left. Motor: RUE/RLE: 0/5 proximal distal, unchanged Sensation diminished to light touch RUE/RLE   Assessment/Plan: 1. Functional deficits which require 3+ hours per day of interdisciplinary therapy in a comprehensive inpatient rehab  setting. Physiatrist is providing close team supervision and 24 hour management of active medical problems listed below. Physiatrist and rehab team continue to assess barriers to discharge/monitor patient progress toward functional and medical goals   Care Tool:  Bathing    Body parts bathed by patient: Right arm, Chest, Abdomen, Right upper leg, Left upper leg, Face   Body parts bathed by helper: Left arm, Front perineal area, Buttocks, Right lower leg, Left lower leg     Bathing assist Assist Level: Total Assistance - Patient < 25%     Upper Body Dressing/Undressing Upper body dressing   What is the patient wearing?: Pull over shirt    Upper body assist Assist Level: Maximal Assistance - Patient 25 - 49%    Lower Body Dressing/Undressing Lower body dressing      What is the patient wearing?: Pants     Lower body assist Assist for lower body dressing: Maximal Assistance - Patient 25 - 49%     Toileting Toileting    Toileting assist Assist for toileting: Minimal Assistance - Patient > 75%     Transfers Chair/bed transfer  Transfers assist     Chair/bed transfer assist level: Minimal Assistance - Patient > 75%     Locomotion Ambulation   Ambulation assist   Ambulation activity did not occur: Safety/medical concerns  Walk 10 feet activity   Assist  Walk 10 feet activity did not occur: Safety/medical concerns        Walk 50 feet activity   Assist Walk 50 feet with 2 turns activity did not occur: Safety/medical concerns         Walk 150 feet activity   Assist Walk 150 feet activity did not occur: Safety/medical concerns         Walk 10 feet on uneven surface  activity   Assist Walk 10 feet on uneven surfaces activity did not occur: Safety/medical concerns         Wheelchair     Assist Is the patient using a wheelchair?: Yes   Wheelchair activity did not occur: Safety/medical concerns         Wheelchair  50 feet with 2 turns activity    Assist    Wheelchair 50 feet with 2 turns activity did not occur: Safety/medical concerns       Wheelchair 150 feet activity     Assist  Wheelchair 150 feet activity did not occur: Safety/medical concerns        Medical Problem List and Plan: 1.  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  Impaired mobility -DVT/anticoagulation:  Pharmaceutical: Continue Heparin             -antiplatelet therapy: N/A 3. Pain Management: As needed meds 4. Anxiety: Continue Xanax 0.5 mg 3 times daily, BuSpar 5 mg twice daily, hydroxyzine 25 mg 4 times daily             -antipsychotic agents: N/A 5. Neuropsych: This patient is not fully capable of making decisions on her own behalf. 6. Skin/Wound Care: Routine skin checks 7. Fluids/Electrolytes/Nutrition: Routine in and outs 8.  Hypertension.  Continue Norvasc 5 mg daily, Cozaar 50 mg twice daily.               Monitor with increased mobility 9.  Hyperlipidemia: Lipitor 10.  COVID-19.  Completed course of remdesivir.  Currently completing course of dexamethasone 11.  Obesity.  BMI 39.52.  Dietary follow-up.  Encourage weight loss 12.  Leukocytosis-likely secondary to steroids             WBCs 17.4 on 9/21, repeat Monday             Afebrile, no signs/symptoms of infection 13.  Transaminitis  ALT elevated on 9/21, continue to monitor 14. Orthostatic hypotension: continue TEDs, added abdominal binder in case this is not enough. Improved  LOS: 3 days A FACE TO FACE EVALUATION WAS PERFORMED  Clide Deutscher Marria Mathison 12/10/2020, 12:19 PM

## 2020-12-10 NOTE — IPOC Note (Signed)
Overall Plan of Care [facility]) Patient Details Name: Felicia Chen MRN: [id number] DOB: May 18, 1965  Admitting Diagnosis: ICH (intracerebral hemorrhage) [facility])  Hospital Problems: Principal Problem:   ICH (intracerebral hemorrhage) ([place]) Active Problems:   Transaminitis   Right hemiplegia ([place])     Functional Problem List: Nursing Bladder, Bowel, Endurance, Medication Management, Pain, Safety, Perception, Sensory  PT Balance, Perception, Behavior, Safety, Sensory, Endurance, Motor  OT Balance, Cognition, Edema, Endurance, Motor, Perception, Safety, Sensory  SLP Cognition  TR         Basic ADL's: OT Eating, Grooming, Bathing, Dressing, Toileting     Advanced  ADL's: OT       Transfers: PT Bed Mobility, Bed to Chair, Teacher, early years/pre, Tub/Shower     Locomotion: PT Ambulation, Emergency planning/management officer, Stairs     Additional Impairments: OT Fuctional Use of Upper Extremity  SLP Communication, Social Cognition comprehension, expression Memory, Awareness  TR      Anticipated Outcomes Item Anticipated Outcome  Self Feeding set up  Swallowing      Basic self-care  Min A  Toileting  Min A   Bathroom Transfers CGA  Bowel/Bladder  min assist  Transfers  min A  Locomotion  supervision w/c, mod A gait  Communication  Min-Supervision A  Cognition  Min- Supervision A  Pain  < 3  Safety/Judgment  min assist and no falls   Therapy Plan: PT Intensity: Minimum of 1-2 x/day ,45 to 90 minutes PT Frequency: 5 out of 7 days PT Duration Estimated Length of Stay: ~3 weeks OT Intensity: Minimum of 1-2 x/day, 45 to 90 minutes OT Frequency: 5 out of 7 days OT Duration/Estimated Length of Stay: 21-24 days SLP Intensity: Minumum of 1-2 x/day, 30 to 90 minutes SLP Frequency: 3 to 5 out of 7 days SLP Duration/Estimated Length of Stay: 3-4 weeks   Due to the current state of emergency, patients may not be receiving their 3-hours of Medicare-mandated therapy.   Team  Interventions: Nursing Interventions Patient/Family Education, Bladder Management, Bowel Management, Disease Management/Prevention, Pain Management, Medication Management, Discharge Planning  PT interventions Ambulation/gait training, Cognitive remediation/compensation, Discharge planning, Functional mobility training, Pain management, DME/adaptive equipment instruction, Psychosocial support, UE/LE Strength taining/ROM, Therapeutic Activities, Visual/perceptual remediation/compensation, Training and development officer, Community reintegration, Disease management/prevention, Neuromuscular re-education, Patient/family education, Skin care/wound management, IT trainer, Therapeutic Exercise, Wheelchair propulsion/positioning, UE/LE Coordination activities  OT Interventions Training and development officer, Discharge planning, Functional electrical stimulation, Pain management, Self Care/advanced ADL retraining, Therapeutic Activities, UE/LE Coordination activities, Visual/perceptual remediation/compensation, Therapeutic Exercise, Skin care/wound managment, Patient/family education, Functional mobility training, Disease mangement/prevention, Cognitive remediation/compensation, Academic librarian, Engineer, drilling, Neuromuscular re-education, Psychosocial support, Splinting/orthotics, UE/LE Strength taining/ROM, Wheelchair propulsion/positioning  SLP Interventions Cognitive remediation/compensation, English as a second language teacher, Functional tasks, Speech/Language facilitation, Internal/external aids, Patient/family education  TR Interventions    SW/CM Interventions Discharge Planning, Psychosocial Support, Patient/Family Education   Barriers to Discharge MD  Medical stability  Nursing Decreased caregiver support, Home environment access/layout, Incontinence, Lack of/limited family support, Medication compliance Lives in a 1 level home with 2 steps. Lives with spouse and children. Spouse works from home and  can provide 24/7 assist at discharge.  PT Inaccessible home environment, Home environment access/layout, Incontinence    OT Home environment access/layout    SLP      SW       Team Discharge Planning: Destination: PT-Home ,OT- Home , SLP-Home Projected Follow-up: PT-Home health PT, OT-  Home health OT, 24 hour supervision/assistance, SLP-Outpatient SLP, 24 hour supervision/assistance Projected Equipment Needs: PT-Rolling walker  with 5" wheels, Wheelchair (measurements), OT- To be determined, SLP-None recommended by SLP Equipment Details: PT- , OT-  Patient/family involved in discharge planning: PT- Patient,  OT-Patient, Family member/caregiver, SLP-Patient, Family member/caregiver  MD ELOS: 2-3 weeks Medical Rehab Prognosis:  Excellent Assessment: Felicia Chen is a 55 year old woman admitted to CIR with right side hemiplegia secondary to left frontal lobe ICH and small SAH etiology hypertension versus small vessel event. Medications are being managed, and labs and vitals are being monitored regularly.     See Team Conference Notes for weekly updates to the plan of care

## 2020-12-11 NOTE — Progress Notes (Signed)
Physical Therapy Session Note  Patient Details  Name: Felicia Chen MRN: [id number] Date of Birth: January 27, 1966  Today's Date: 12/11/2020 PT Individual Time: 1030-1126 PT Individual Time Calculation (min): 56 min   Short Term Goals: Week 1:  PT Short Term Goal 1 (Week 1): Pt will initiate gait training PT Short Term Goal 2 (Week 1): Pt will perform STS with mod A of 1 PT Short Term Goal 3 (Week 1): Pt will tolerate OOB sitting between therapy sessions  Skilled Therapeutic Interventions/Progress Updates:   Session 1: pt received in bed and agreeable to therapy. No complaint of pain. Ted hose in place on arrival. Supine<>sit x 3 with min A to manage RLE and trunk. Pt education on using LLE to move RLE. Pt able to attempt but required min A d/t limited strength and poor coordination. Pt sat EOB for several minutes at a time with CGA to close supervision at times, but continues to have nausea while OOB. Pt reports it feels like heartburn, but worsens in sitting and resolves in supine. During 3 bouts of sitting EOB, pt performed part practice of anterior weight shift for Sit to stand with NDT facilitation and cueing. Pt able to bear weight through BLE with mod A and clear bottom x 4. Pt reports discomfort/abnormal sensations in LLE but could not articulate what felt wrong. LLE coordination greatly improved from eval with automatic movements, still some difficulty with novel tasks. Pt was able to scoot toward L side with min A d/t using LLE, unable to scoot toward R side this session. Pt returned to bed after session and was left with her daughter at bedside, all needs in reach, and alarm active.   Session 2: pt received in bed and agreeable to therapy with her daughters [name] and [name] present. Supine>sit with mod A for LE and trunk management. Pt reports some nausea in sitting, but that it resolves within 1-2 min. Session focused on standing tolerance and LE exercise. Pt performed Sit to stand in  stedy with mod A fading to min A with repetition. Pt directed in squats with stedy, 3 x 5, sitting all the way to bed for each rest break. Pt perseverative on not understanding her condition (nausea). This therapist educated on likely causes, including blood pressure and new medications. Even after explanation, pt returned to topic without recalling what was discussed. At end of session pt stood with stedy for 1 min 40 sec before becoming lightheaded and needing to sit. Pt tends to stand with L lateral lean and is unable to reorient to midline without assist. Pt returned to supine with min A for RLE management, scooting in bed with min A. Pt remained in bed and was left with all needs in reach and alarm active and her daughters present.    Therapy Documentation Precautions:  Precautions Precautions: Fall Precaution Comments: R hemi, inattention. SBP parameters <160 per neuro on acute Restrictions Weight Bearing Restrictions: No   Therapy/Group: Individual Therapy  Mickel Fuchs 12/11/2020, 11:30 AM

## 2020-12-12 DIAGNOSIS — I612 Nontraumatic intracerebral hemorrhage in hemisphere, unspecified: Secondary | ICD-10-CM

## 2020-12-12 MED ORDER — BUSPIRONE HCL 5 MG PO TABS
5.0000 mg | ORAL_TABLET | Freq: Three times a day (TID) | ORAL | Status: DC
  Administered 2020-12-12 – 2021-01-05 (×73): 5 mg via ORAL
  Filled 2020-12-12 (×74): qty 1

## 2020-12-12 NOTE — Plan of Care (Signed)
  Problem: Consults Goal: RH STROKE PATIENT EDUCATION Description: See Patient Education module for education specifics  Outcome: Progressing   Problem: RH BOWEL ELIMINATION Goal: RH STG MANAGE BOWEL WITH ASSISTANCE Description: STG Manage Bowel with [place]. Outcome: Progressing   Problem: RH BLADDER ELIMINATION Goal: RH STG MANAGE BLADDER WITH ASSISTANCE Description: STG Manage Bladder With Min Assistance Outcome: Progressing   Problem: RH SKIN INTEGRITY Goal: RH STG MAINTAIN SKIN INTEGRITY WITH ASSISTANCE Description: STG Maintain Skin Integrity With [place]. Outcome: Progressing   Problem: RH SAFETY Goal: RH STG ADHERE TO SAFETY PRECAUTIONS W/ASSISTANCE/DEVICE Description: STG Adhere to Safety Precautions With Min Assistance/Device. Outcome: Progressing   Problem: RH PAIN MANAGEMENT Goal: RH STG PAIN MANAGED AT OR BELOW PT'S PAIN GOAL Description: < 3 on a 0-10 pain scale. Outcome: Progressing

## 2020-12-12 NOTE — Progress Notes (Signed)
Occupational Therapy Session Note  Patient Details  Name: Felicia Chen MRN: [id number] Date of Birth: 07-19-1965  Today's Date: 12/13/2020 OT Individual Time: 0177-9390 OT Individual Time Calculation (min): 45 min    Skilled Therapeutic Interventions/Progress Updates:    Pt greeted in bed with no c/o pain, just persistent nausea that had gotten better since RN gave her some antinausea medicine. OT provided pt with an antinausea aromatherapy blend at start of session to address therapeutically. Pts husband and daughter [name] were present. Both agreeable to engage in family education today. Discussed importance of hemiplegic positioning in bed and advised changing position every hour for pressure relief. Pt able to roll Rt>Lt multiple times in order for OT to demonstrate to family how to assist pt in sidelying positions, and again after when family members had hands on practice. Discussed rolling technique and limb protection. Max-Total A for rolling. Worked on core strengthening via supine reaches to target, crunches using the lower bedrail, and long sitting in bed. Pt very tight in B hamstrings Rt>Lt. Educated family how to assist her with hamstring stretching. Pt returned to bed at close of session, left her with all needs within reach and bed alarm set. Note that pt did not c/o dizziness throughout tx. She does continue to exhibit a great deal of trouble motor planning basic instruction.   Therapy Documentation Precautions:  Precautions Precautions: Fall Precaution Comments: R hemi, inattention. SBP parameters <160 per neuro on acute Restrictions Weight Bearing Restrictions: No  ADL: ADL Eating: Not assessed Grooming: Not assessed Upper Body Bathing: Moderate assistance Lower Body Bathing: Maximal assistance Upper Body Dressing: Maximal assistance Lower Body Dressing: Dependent Toileting: Dependent Toilet Transfer: Other (comment) (stedy)     Therapy/Group: Individual  Therapy  Anju Sereno A Tyren Dugar 12/13/2020, 3:40 PM

## 2020-12-12 NOTE — Progress Notes (Addendum)
[facility] PHYSICAL MEDICINE & REHABILITATION PROGRESS NOTE  Subjective/Complaints:  875ml cath , I/O cath  On atarax 25 QID scheduled  (anticholinergic )  ROS: Denies CP, SOB, N/V/D, constipation  Objective: Vital Signs: Blood pressure 122/69, pulse 78, temperature 98.4 F (36.9 C), temperature source Oral, resp. rate 18, height 5' 7.99" (1.727 m), weight 109.4 kg, SpO2 97 %. No results found. No results for input(s): WBC, HGB, HCT, PLT in the last 72 hours.  No results for input(s): NA, K, CL, CO2, GLUCOSE, BUN, CREATININE, CALCIUM in the last 72 hours.   Intake/Output Summary (Last 24 hours) at 12/12/2020 0932 Last data filed at 12/12/2020 0035 Gross per 24 hour  Intake 360 ml  Output 1101 ml  Net -741 ml         Physical Exam: BP 122/69   Pulse 78   Temp 98.4 F (36.9 C) (Oral)   Resp 18   Ht 5' 7.99" (1.727 m)   Wt 109.4 kg   SpO2 97%   BMI 36.68 kg/m   General: No acute distress Mood and affect are appropriate Heart: Regular rate and rhythm no rubs murmurs or extra sounds Lungs: Clear to auscultation, breathing unlabored, no rales or wheezes Abdomen: Positive bowel sounds, soft nontender to palpation, nondistended Extremities: No clubbing, cyanosis, or edema Skin: No evidence of breakdown, no evidence of rash   Makes eye contact with examiner.   Follows simple commands.   Provides her name and age.   She does some difficulty differentiating right from left. Motor: RUE/RLE: 0/5 proximal distal, unchanged Sensation diminished to light touch RUE/RLE   Assessment/Plan: 1. Functional deficits which require 3+ hours per day of interdisciplinary therapy in a comprehensive inpatient rehab setting. Physiatrist is providing close team supervision and 24 hour management of active medical problems listed below. Physiatrist and rehab team continue to assess barriers to discharge/monitor patient progress toward functional and medical goals   Care  Tool:  Bathing    Body parts bathed by patient: Right arm, Chest, Abdomen, Right upper leg, Left upper leg, Face   Body parts bathed by helper: Left arm, Front perineal area, Buttocks, Right lower leg, Left lower leg     Bathing assist Assist Level: Total Assistance - Patient < 25%     Upper Body Dressing/Undressing Upper body dressing   What is the patient wearing?: Pull over shirt    Upper body assist Assist Level: Maximal Assistance - Patient 25 - 49%    Lower Body Dressing/Undressing Lower body dressing      What is the patient wearing?: Pants     Lower body assist Assist for lower body dressing: Maximal Assistance - Patient 25 - 49%     Toileting Toileting    Toileting assist Assist for toileting: Minimal Assistance - Patient > 75%     Transfers Chair/bed transfer  Transfers assist     Chair/bed transfer assist level: Dependent - mechanical lift     Locomotion Ambulation   Ambulation assist   Ambulation activity did not occur: Safety/medical concerns          Walk 10 feet activity   Assist  Walk 10 feet activity did not occur: Safety/medical concerns        Walk 50 feet activity   Assist Walk 50 feet with 2 turns activity did not occur: Safety/medical concerns         Walk 150 feet activity   Assist Walk 150 feet activity did not occur: Safety/medical concerns  Walk 10 feet on uneven surface  activity   Assist Walk 10 feet on uneven surfaces activity did not occur: Safety/medical concerns         Wheelchair     Assist Is the patient using a wheelchair?: Yes   Wheelchair activity did not occur: Safety/medical concerns         Wheelchair 50 feet with 2 turns activity    Assist    Wheelchair 50 feet with 2 turns activity did not occur: Safety/medical concerns       Wheelchair 150 feet activity     Assist  Wheelchair 150 feet activity did not occur: Safety/medical concerns         Medical Problem List and Plan: 1.  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  Impaired mobility -DVT/anticoagulation:  Pharmaceutical: Continue Heparin             -antiplatelet therapy: N/A 3. Pain Management: As needed meds 4. Anxiety: Continue Xanax 0.5 mg 3 times daily, BuSpar 5 mg twice daily increase to TID , hydroxyzine 25 mg 4 times daily- d/c atarax due to urinary retention             -antipsychotic agents: N/A 5. Neuropsych: This patient is not fully capable of making decisions on her own behalf. 6. Skin/Wound Care: Routine skin checks 7. Fluids/Electrolytes/Nutrition: Routine in and outs 8.  Hypertension.  Continue Norvasc 5 mg daily, Cozaar 50 mg twice daily.               Monitor with increased mobility 9.  Hyperlipidemia: Lipitor 10.  COVID-19.  Completed course of remdesivir.  Currently completing course of dexamethasone 11.  Obesity.  BMI 39.52.  Dietary follow-up.  Encourage weight loss 12.  Leukocytosis-likely secondary to steroids             WBCs 17.4 on 9/21, repeat Monday             Afebrile, no signs/symptoms of infection 13.  Transaminitis  ALT elevated on 9/21, continue to monitor 14. Orthostatic hypotension: continue TEDs, added abdominal binder in case this is not enough. Improved  LOS: 5 days A FACE TO FACE EVALUATION WAS PERFORMED  Charlett Blake 12/12/2020, 9:32 AM

## 2020-12-13 MED ORDER — BETHANECHOL CHLORIDE 10 MG PO TABS
5.0000 mg | ORAL_TABLET | Freq: Three times a day (TID) | ORAL | Status: DC
  Administered 2020-12-13 – 2020-12-17 (×15): 5 mg via ORAL
  Filled 2020-12-13 (×6): qty 1
  Filled 2020-12-13: qty 0.5
  Filled 2020-12-13 (×9): qty 1

## 2020-12-13 MED ORDER — LOSARTAN POTASSIUM 25 MG PO TABS
25.0000 mg | ORAL_TABLET | Freq: Two times a day (BID) | ORAL | Status: DC
  Administered 2020-12-13 – 2020-12-22 (×18): 25 mg via ORAL
  Filled 2020-12-13 (×20): qty 1

## 2020-12-13 NOTE — Progress Notes (Signed)
Speech Language Pathology Daily Session Note  Patient Details  Name: Felicia Chen MRN: [id number] Date of Birth: 1966/02/27  Today's Date: 12/13/2020 SLP Individual Time: 1400-1500 SLP Individual Time Calculation (min): 60 min  Short Term Goals: Week 1: SLP Short Term Goal 1 (Week 1): Pt will participate in further language assessment. SLP Short Term Goal 2 (Week 1): Pt will demonstrate ability to follow 1 step directions with 80% accuracy given mod A multimodal cues. SLP Short Term Goal 3 (Week 1): Pt will demonstrate word finding strategies in higher level tasks such as divergent naming with mod A semantic cues. SLP Short Term Goal 4 (Week 1): Pt will demonstrate recall of personal information, as well as daily and novel information with mod A verbal cues. SLP Short Term Goal 5 (Week 1): Pt will demonstrated recall of personal information, as well as daily and novel information with supervision A verbal cues for memory notebook. SLP Short Term Goal 6 (Week 1): Pt will demonstrate self-monitoring verbal and functional errors with mod A verbal cues.  Skilled Therapeutic Interventions:Skilled ST services focused on language skills. Pt was unable to recall today's events, but just general worked with therapy. SLP facilitated reading skills using the Reading Comprehension Battery for Aphasia, pt was able to select at word level synonyms in 9/10 opportunities with mod A verbal cues for encouragement and to recall directions. Pt demonstrated 10/10 accuracy in comprehending simple sentence with mod A verbal cues for reading accuracy and mod-max A verbal cues to recall directions. Pt required max A verbal cues to complete functional reading sections, with more complex sentences. SLP initated memory notebook, with directions for staff to write only very simple phrase/sentences for recall. Pt was able to name as well as describe 3/4 items in the room (error on cell phone) and with semantic cues  demonstrated 4/4 accuracy. Pt required demonstration cues to utilize call bell appropriately. Pt was left in room with call bell within reach and bed alarm set. SLP recommends to continue skilled services.       Pain Pain Assessment Pain Score: 0-No pain  Therapy/Group: Individual Therapy  Kensli Bowley  [facility] 12/13/2020, 3:08 PM

## 2020-12-13 NOTE — Progress Notes (Signed)
Occupational Therapy Session Note  Patient Details  Name: Felicia Chen MRN: [id number] Date of Birth: 1965/05/14  Today's Date: 12/13/2020 OT Individual Time: 3662-9476 OT Individual Time Calculation (min): 54 min    Short Term Goals: Week 1:  OT Short Term Goal 1 (Week 1): Pt will perform sit <> stand at LRAD with MOD of 1 OT Short Term Goal 2 (Week 1): Pt will thread BLEs into pants with AE PRN OT Short Term Goal 3 (Week 1): Pt will perform BSC/toilet transfer with MOD of 1 OT Short Term Goal 4 (Week 1): Pt will perform self ROM for RUE with no more than Min cues  Skilled Therapeutic Interventions/Progress Updates:    Pt greeted at time of session sitting up in TIS chair semireclined with daughter in room who remained throughout session. Pt ADL needs already met, wanting to focus on balance and RUE. Agreeable to out of room activity, therapist providing brief tour around CIR for pt comfort and decreasing anxiety with going to gym. In ortho gym, pt participated in [place] for 2 rounds for the following: first round of 2 minutes for core strength/sitting balance with LUE hitting targets, 2nd round alphabet for sequencing and recall able to get A-G with  Max cues before feeling nauseous/dizzy. Note mild/moderate R innattention as well. BP checks are as follows with pt reclined in TIS: 96/69, approx 2 minutes later dropped to 88/62 and then further reclined. Increased to 102/66. Note during rest breaks between BITS also performed PROM/AAROM for RUE for shoulder flexion/extension, elbow ROM, and wrist, as well as educated on role and purpose of splint. Back in room, attempted one sit > stand with RLE blocked and HHA, dizziness present and checked in sitting 93/59. Symptoms resolving and pt feeling better but tired at end of session. RUE elevated, TIS reclined, alarm on call bell in reach and daughter present.   Therapy Documentation Precautions:  Precautions Precautions: Fall Precaution  Comments: R hemi, inattention. SBP parameters <160 per neuro on acute Restrictions Weight Bearing Restrictions: No     Therapy/Group: Individual Therapy  [name] 12/13/2020, 7:26 AM

## 2020-12-13 NOTE — Progress Notes (Signed)
PHYSICAL MEDICINE & REHABILITATION PROGRESS NOTE  Subjective/Complaints: Still requiring cath, 5a as well as now  Now off atarax   ROS: Denies CP, SOB, N/V/D, constipation  Objective: Vital Signs: Blood pressure 95/60, pulse 79, temperature 97.9 F (36.6 C), resp. rate 17, height 5' 7.99" (1.727 m), weight 109.4 kg, SpO2 93 %. No results found. No results for input(s): WBC, HGB, HCT, PLT in the last 72 hours.  No results for input(s): NA, K, CL, CO2, GLUCOSE, BUN, CREATININE, CALCIUM in the last 72 hours.   Intake/Output Summary (Last 24 hours) at 12/13/2020 0909 Last data filed at 12/13/2020 0742 Gross per 24 hour  Intake 480 ml  Output 1775 ml  Net -1295 ml         Physical Exam: BP 95/60   Pulse 79   Temp 97.9 F (36.6 C)   Resp 17   Ht 5' 7.99" (1.727 m)   Wt 109.4 kg   SpO2 93%   BMI 36.68 kg/m   General: No acute distress Mood and affect are appropriate Heart: Regular rate and rhythm no rubs murmurs or extra sounds Lungs: Clear to auscultation, breathing unlabored, no rales or wheezes Abdomen: Positive bowel sounds, soft nontender to palpation, nondistended Extremities: No clubbing, cyanosis, or edema Skin: No evidence of breakdown, no evidence of rash A and O x 3   She does some difficulty differentiating right from left. Motor: RUE/RLE: 0/5 proximal distal, unchanged Sensation diminished to light touch RUE/RLE   Assessment/Plan: 1. Functional deficits which require 3+ hours per day of interdisciplinary therapy in a comprehensive inpatient rehab setting. Physiatrist is providing close team supervision and 24 hour management of active medical problems listed below. Physiatrist and rehab team continue to assess barriers to discharge/monitor patient progress toward functional and medical goals   Care Tool:  Bathing    Body parts bathed by patient: Right arm, Chest, Abdomen, Right upper leg, Left upper leg, Face   Body parts bathed by  helper: Left arm, Front perineal area, Buttocks, Right lower leg, Left lower leg     Bathing assist Assist Level: Total Assistance - Patient < 25%     Upper Body Dressing/Undressing Upper body dressing   What is the patient wearing?: Pull over shirt    Upper body assist Assist Level: Maximal Assistance - Patient 25 - 49%    Lower Body Dressing/Undressing Lower body dressing      What is the patient wearing?: Pants     Lower body assist Assist for lower body dressing: Maximal Assistance - Patient 25 - 49%     Toileting Toileting    Toileting assist Assist for toileting: Minimal Assistance - Patient > 75%     Transfers Chair/bed transfer  Transfers assist     Chair/bed transfer assist level: Dependent - mechanical lift     Locomotion Ambulation   Ambulation assist   Ambulation activity did not occur: Safety/medical concerns          Walk 10 feet activity   Assist  Walk 10 feet activity did not occur: Safety/medical concerns        Walk 50 feet activity   Assist Walk 50 feet with 2 turns activity did not occur: Safety/medical concerns         Walk 150 feet activity   Assist Walk 150 feet activity did not occur: Safety/medical concerns         Walk 10 feet on uneven surface  activity   Assist Walk 10  feet on uneven surfaces activity did not occur: Safety/medical concerns         Wheelchair     Assist Is the patient using a wheelchair?: Yes   Wheelchair activity did not occur: Safety/medical concerns         Wheelchair 50 feet with 2 turns activity    Assist    Wheelchair 50 feet with 2 turns activity did not occur: Safety/medical concerns       Wheelchair 150 feet activity     Assist  Wheelchair 150 feet activity did not occur: Safety/medical concerns        Medical Problem List and Plan: 1.  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  Impaired mobility -DVT/anticoagulation:  Pharmaceutical: Continue Heparin             -antiplatelet therapy: N/A 3. Pain Management: As needed meds 4. Anxiety: Continue Xanax 0.5 mg 3 times daily, BuSpar 5 mg twice daily increase to TID , hydroxyzine 25 mg 4 times daily- d/c atarax due to urinary retention             -antipsychotic agents: N/A 5. Neuropsych: This patient is not fully capable of making decisions on her own behalf. 6. Skin/Wound Care: Routine skin checks 7. Fluids/Electrolytes/Nutrition: Routine in and outs 8.  Hypertension.  Continue Norvasc 5 mg daily, Cozaar 50 mg twice daily.               Monitor with increased mobility Vitals:   12/12/20 2116 12/13/20 0517  BP: (!) 93/58 95/60  Pulse: 79 79  Resp: 17 17  Temp: 98.2 F (36.8 C) 97.9 F (36.6 C)  SpO2: 92% 93%  Running on low side reduce cozaar to 25mg   9.  Hyperlipidemia: Lipitor 10.  COVID-19.  Completed course of remdesivir.  Currently completing course of dexamethasone 11.  Obesity.  BMI 39.52.  Dietary follow-up.  Encourage weight loss 12.  Leukocytosis-likely secondary to steroids             WBCs 17.4 on 9/21, repeat 9/26             Afebrile, no signs/symptoms of infection 13.  Transaminitis  ALT elevated on 9/21, continue to monitor 14. Orthostatic hypotension: continue TEDs, added abdominal binder in case this is not enough. Improved 15.  Urinary retention add urecholine , now off anticholinergic agents , BP too low for flomax also with orthostasis  LOS: 6 days A FACE TO FACE EVALUATION WAS PERFORMED  Charlett Blake 12/13/2020, 9:09 AM

## 2020-12-13 NOTE — Progress Notes (Signed)
Physical Therapy Session Note  Patient Details  Name: Felicia Chen MRN: [id number] Date of Birth: 1965-10-09  Today's Date: 12/13/2020 PT Individual Time: 9323-5573 PT Individual Time Calculation (min): 34 min   Short Term Goals: Week 1:  PT Short Term Goal 1 (Week 1): Pt will initiate gait training PT Short Term Goal 2 (Week 1): Pt will perform STS with mod A of 1 PT Short Term Goal 3 (Week 1): Pt will tolerate OOB sitting between therapy sessions  Skilled Therapeutic Interventions/Progress Updates:    pt received in bed and agreeable to therapy and her daughter present. No complaint of pain. Therapist retrieved TIS w/c to assist with OOB tolerance and BP regulation. At this time, nsg in/out for I/O cath, pt missed 12 min of scheduled therapy for nsg care.  Mod A supine>sit with HHA to elevate trunk. Pt reports low grade nausea while seated EOB and stated that symptoms improved in several minutes. Session focused on part practice and performing squat pivot transfers. Pt performed anterior weight shift to lift hips from bed x 3 with mod-max A at times. Pt then performed lateral scoots lifting hips to scoot laterally x 1 BIL, mod A to L side, max to R side. Pt then performed squat pivot transfer with max A EOB>TIS toward L side. Multimodal cues throughout for technique and hand placement. Pt reported increased nausea after transfer, which resolved by tilting back in chair. Pt's daughter, Felicia Chen, educated on how to tilt chair if symptoms worsen and demoed understanding. Pt remained slightly reclined in TIS at end of session with seatbelt buckled, and was left with all needs in reach and alarm active.   Therapy Documentation Precautions:  Precautions Precautions: Fall Precaution Comments: R hemi, inattention. SBP parameters <160 per neuro on acute Restrictions Weight Bearing Restrictions: No General: PT Amount of Missed Time (min): 12 Minutes PT Missed Treatment Reason: Nursing care (I/O  cath)    Therapy/Group: Individual Therapy  Mickel Fuchs 12/13/2020, 11:57 AM

## 2020-12-14 MED ORDER — ONDANSETRON HCL 4 MG PO TABS
4.0000 mg | ORAL_TABLET | Freq: Every day | ORAL | Status: DC
  Administered 2020-12-15 – 2021-01-05 (×22): 4 mg via ORAL
  Filled 2020-12-14 (×22): qty 1

## 2020-12-14 NOTE — Progress Notes (Signed)
[place] PHYSICAL MEDICINE & REHABILITATION PROGRESS NOTE  Subjective/Complaints:  Slept well- denies pain- eating OK.  LBM- feels like it "takes forever". Still having nausea- esp in AM.  However per chart, had 2 large BM this AM- 1 at midnight and 1 at 6am- still requiring in/out caths.   Hasn't voided at all in last 24 hours.   ROS:  Pt denies SOB, abd pain, CP, N/V/C/D, and vision changes  Objective: Vital Signs: Blood pressure 131/66, pulse 78, temperature 98.2 F (36.8 C), temperature source Oral, resp. rate 18, height 5' 7.99" (1.727 m), weight 109.4 kg, SpO2 97 %. No results found. No results for input(s): WBC, HGB, HCT, PLT in the last 72 hours.  No results for input(s): NA, K, CL, CO2, GLUCOSE, BUN, CREATININE, CALCIUM in the last 72 hours.   Intake/Output Summary (Last 24 hours) at 12/14/2020 0902 Last data filed at 12/14/2020 0007 Gross per 24 hour  Intake 480 ml  Output 1500 ml  Net -1020 ml        Physical Exam: BP 131/66 (BP Location: Left Arm)   Pulse 78   Temp 98.2 F (36.8 C) (Oral)   Resp 18   Ht 5' 7.99" (1.727 m)   Wt 109.4 kg   SpO2 97%   BMI 36.68 kg/m    General: awake, alert, appropriate, sitting up in bed; vague; R resting wrist splint in place; NAD HENT: conjugate gaze; oropharynx moist CV: regular rate; no JVD Pulmonary: CTA B/L; no W/R/R- good air movement GI: soft, NT, ND, (+)BS; slightly hyperactive Psychiatric: appropriate but vague- aphasic? Neurological: alert  Extremities: No clubbing, cyanosis, or edema Skin: No evidence of breakdown, no evidence of rash A and O x 3   She does some difficulty differentiating right from left. Motor: RUE/RLE: 0/5 proximal distal, unchanged Sensation diminished to light touch RUE/RLE   Assessment/Plan: 1. Functional deficits which require 3+ hours per day of interdisciplinary therapy in a comprehensive inpatient rehab setting. Physiatrist is providing close team supervision and 24 hour  management of active medical problems listed below. Physiatrist and rehab team continue to assess barriers to discharge/monitor patient progress toward functional and medical goals   Care Tool:  Bathing    Body parts bathed by patient: Right arm, Chest, Abdomen, Right upper leg, Left upper leg, Face   Body parts bathed by helper: Left arm, Front perineal area, Buttocks, Right lower leg, Left lower leg     Bathing assist Assist Level: Total Assistance - Patient < 25%     Upper Body Dressing/Undressing Upper body dressing   What is the patient wearing?: Pull over shirt    Upper body assist Assist Level: Maximal Assistance - Patient 25 - 49%    Lower Body Dressing/Undressing Lower body dressing      What is the patient wearing?: Pants     Lower body assist Assist for lower body dressing: Maximal Assistance - Patient 25 - 49%     Toileting Toileting    Toileting assist Assist for toileting: Minimal Assistance - Patient > 75%     Transfers Chair/bed transfer  Transfers assist     Chair/bed transfer assist level: Maximal Assistance - Patient 25 - 49% (squat pivot toward L)     Locomotion Ambulation   Ambulation assist   Ambulation activity did not occur: Safety/medical concerns          Walk 10 feet activity   Assist  Walk 10 feet activity did not occur: Safety/medical concerns  Walk 50 feet activity   Assist Walk 50 feet with 2 turns activity did not occur: Safety/medical concerns         Walk 150 feet activity   Assist Walk 150 feet activity did not occur: Safety/medical concerns         Walk 10 feet on uneven surface  activity   Assist Walk 10 feet on uneven surfaces activity did not occur: Safety/medical concerns         Wheelchair     Assist Is the patient using a wheelchair?: Yes   Wheelchair activity did not occur: Safety/medical concerns         Wheelchair 50 feet with 2 turns  activity    Assist    Wheelchair 50 feet with 2 turns activity did not occur: Safety/medical concerns       Wheelchair 150 feet activity     Assist  Wheelchair 150 feet activity did not occur: Safety/medical concerns        Medical Problem List and Plan: 1.  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  Impaired mobility -DVT/anticoagulation:  Pharmaceutical: Continue Heparin             -antiplatelet therapy: N/A 3. Pain Management: As needed meds 4. Anxiety: Continue Xanax 0.5 mg 3 times daily, BuSpar 5 mg twice daily increase to TID , hydroxyzine 25 mg 4 times daily- d/c atarax due to urinary retention             -antipsychotic agents: N/A 5. Neuropsych: This patient is not fully capable of making decisions on her own behalf. 6. Skin/Wound Care: Routine skin checks 7. Fluids/Electrolytes/Nutrition: Routine in and outs 8.  Hypertension.  Continue Norvasc 5 mg daily, Cozaar 50 mg twice daily.               Monitor with increased mobility Vitals:   12/13/20 1925 12/14/20 0615  BP: 124/89 131/66  Pulse: 79 78  Resp: 18 18  Temp: 98.3 F (36.8 C) 98.2 F (36.8 C)  SpO2: 99% 97%  Running on low side reduce cozaar to 25mg    9/27- BP controlled- con't regimen 9.  Hyperlipidemia: Lipitor 10.  COVID-19.  Completed course of remdesivir.  Currently completing course of dexamethasone 11.  Obesity.  BMI 39.52.  Dietary follow-up.  Encourage weight loss 12.  Leukocytosis-likely secondary to steroids             WBCs 17.4 on 9/21, repeat 9/26             Afebrile, no signs/symptoms of infection  9/27- no labs yesterday- will check in AM 13.  Transaminitis  ALT elevated on 9/21, continue to monitor  9/27- will recheck CMP in AM 14. Orthostatic hypotension: continue TEDs, added abdominal binder in case  this is not enough. Improved 15.  Urinary retention add urecholine , now off anticholinergic agents , BP too low for flomax also with orthostasis   9/27- cannot add Flomax due to low BP- is NOT voiding at all- all in/out caths-  16. Nausea  9/27- will schedule Zofran 4 mg at 6am daily to help with nausea and monitor    LOS: 7 days A FACE TO FACE EVALUATION WAS PERFORMED  Felicia Chen 12/14/2020, 9:02 AM

## 2020-12-14 NOTE — Progress Notes (Signed)
Speech Language Pathology Weekly Progress and Session Note  Patient Details  Name: Felicia Chen MRN: [id number] Date of Birth: 23-Nov-1965  Beginning of progress report period: December 08, 2020 End of progress report period: December 14, 2020  Today's Date: 12/14/2020 SLP Individual Time: 9390-3009 SLP Individual Time Calculation (min): 43 min  Short Term Goals: Week 1: SLP Short Term Goal 1 (Week 1): Pt will participate in further language assessment. SLP Short Term Goal 1 - Progress (Week 1): Met SLP Short Term Goal 2 (Week 1): Pt will demonstrate ability to follow 1 step directions with 80% accuracy given mod A multimodal cues. SLP Short Term Goal 2 - Progress (Week 1): Met SLP Short Term Goal 3 (Week 1): Pt will demonstrate word finding strategies in higher level tasks such as divergent naming with mod A semantic cues. SLP Short Term Goal 3 - Progress (Week 1): Met SLP Short Term Goal 4 (Week 1): Pt will demonstrate recall of personal information, as well as daily and novel information with mod A verbal cues. SLP Short Term Goal 4 - Progress (Week 1): Not met SLP Short Term Goal 5 (Week 1): Pt will demonstrated recall of personal information, as well as daily and novel information with supervision A verbal cues for memory notebook. SLP Short Term Goal 5 - Progress (Week 1): Not met SLP Short Term Goal 6 (Week 1): Pt will demonstrate self-monitoring verbal and functional errors with mod A verbal cues. SLP Short Term Goal 6 - Progress (Week 1): Met    New Short Term Goals: Week 2: SLP Short Term Goal 1 (Week 2): Pt will demonstrate reading comprehenison at the sentence level with 80% accuracy and min A verbal cues. SLP Short Term Goal 2 (Week 2): Pt will demonstrate recall of daily, novel information/events with supervision A verbal cues for use of memory notebook. SLP Short Term Goal 3 (Week 2): Pt will complete basic-mildly complex tasks with mod A verbal cues. SLP Short  Term Goal 4 (Week 2): Pt will self-monitor and self-correct functional and verbal errors with min A verbal cues. SLP Short Term Goal 5 (Week 2): Pt will demonstrate word finding skills in higher level tasks (ex: describing, convergent/divergent naming) with min A verbal cues.  Weekly Progress Updates: Pt made good progress meeting 4 out 6 goals this reporting periods. Pt participated in further assessment of cognitive linguistic skills, including reading comprehension with ability to demonstrate skill at phrase level and simple sentence level. SLP initiated memory notebook to aid in daily recall. Pt demonstrates increase awareness of verbal and functional errors with attempts to correct, following 1 step commands with mod-min A verbal cues and communicate at sentence level with mod-min A breakdowns. Barriers to discharge are receptive and expressive deficits, short term recall deficits and error awareness. Education is on going with various family members present. Pt would continue to benefit from skilled ST services in order to maximize functional independence and reduce burden of care, requiring 24 hour supervision at discharge with continued skilled ST services.      Intensity: Minumum of 1-2 x/day, 30 to 90 minutes Frequency: 3 to 5 out of 7 days Duration/Length of Stay: 10/19 Treatment/Interventions: Cognitive remediation/compensation;Cueing hierarchy;Functional tasks;Speech/Language facilitation;Internal/external aids;Patient/family education   Daily Session  Skilled Therapeutic Interventions:Skilled ST services focused on language skills. Pt was unable to recall daily events, but utilize memory notebook with min A verbal cues. SLP facilitated word finding strategies naming items in picture cards. Pt was able to name mildly  abstract nouns, emotions, and actions with supervision A semantic cues fade to mod I. Pt required min A semantic cues to name preposition in pictures, supervision A semantic  cues to name function of objects and mod-min A semantic and sentence completion cues to sort pictures into categories/convergent naming. Pt was left in room with call bell within reach and bed alarm set. SLP recommends to continue skilled services.     General    Pain Pain Assessment Pain Score: 0-No pain  Therapy/Group: Individual Therapy  Adnan Vanvoorhis  [facility] 12/14/2020, 4:18 PM

## 2020-12-14 NOTE — Patient Care Conference (Signed)
Inpatient RehabilitationTeam Conference and Plan of Care Update Date: 12/14/2020   Time: 11:40 AM    Patient Name: Felicia Chen      Medical Record Number: [id number]  Date of Birth: 1965/05/08 Sex: Female         Room/Bed: 4M13C/4M13C-01 Payor Info: Payor: [employer] park [employer] / Plan: [place] / Product Type: *No Product type* /    Admit Date/Time:  12/07/2020  5:42 PM  Primary Diagnosis:  ICH (intracerebral hemorrhage) [facility])  Hospital Problems: Principal Problem:   ICH (intracerebral hemorrhage) ([place]) Active Problems:   Transaminitis   Right hemiplegia [facility])    Expected Discharge Date: Expected Discharge Date: 01/05/21  Team Members Present: Physician leading conference: Dr. Courtney Heys Social Worker Present: Ovidio Kin, LCSW Nurse Present: Dorthula Nettles, RN PT Present: Ailene Rud, PT OT Present: Lillia Corporal, OT SLP Present: Sherren Kerns, SLP PPS Coordinator present : Gunnar Fusi, SLP     Current Status/Progress Goal Weekly Team Focus  Bowel/Bladder   Incontinent of bowels, retains urine, still doing in and out cath q 8hrly, lbm 9/27  pt will be able to pass urine normally  scheduled toileting   Swallow/Nutrition/ Hydration             ADL's   Stedy transfers, total/Max for toilet hygiene and clothing management, Max/total LB dress, Max UB dress. BP issues/dizziness limiting. RUE flaccid. Motor planning and sequencing limiting.  CGA transfers, Min LB  sit <> stands when able, OOB tolerance, RUE PROM/NMR, ADL retraining, functional transfers, motor planning   Mobility   bed mobility mod-max, squat pivot max x1, STS with mod-max  min A transfers, mod A gait  BP management, OOB tolerance, transfers   Communication   mod A  sup A for expression, min A for comprehension  naming, describing, sentence level reading comprehension, word finding strategies, error awareness   Safety/Cognition/ Behavioral Observations  mod A recall and error  awareness  supervision  functional recall with compensatory strategies and memory notebook, awareness of deficits   Pain   Patient denies pain  Remain pain free  assess for pain q4 and treat prn   Skin   patient has no new skin issues  patient will maintain skin integrity  assess skin q shift and keep clean and dry.     Discharge Planning:  Home with husband who works from home and daughter to assist. Will have 24/7 care   Team Discussion: Being I&O cathed, having nausea, scheduled Zofran at 6 AM. Labs ordered for tomorrow. BP to low for Flomax. Incontinent bowel, I&O cathing for urinary retention, LBM 12/13/20. 7/10 pain reported. Tylenol available. Skin CDI. Nursing educating on bladder management, medications. Barriers are incontinence, urinary retention, and ortho static BP. Thigh high ted hose and abdominal binder added. Patient may not be a functional Ambulator at discharge. Mod/max assist bed mobility, mas assist to the left. Squat pivot mod assist/contact guard. STS mod/max assist. Could be min assist. Min/mod assist goals. Using steady for transfers from elevated bed. ADL's total assist at bed level. RUE flaccid, trying to range it. Mod assist for receptive communication. Supervision goals. Addressing functional communication goals. Working on cognition, recall, memory. Memory notebook started.  Patient on target to meet rehab goals: yes, progress is slow.  *See Care Plan and progress notes for long and short-term goals.   Revisions to Treatment Plan:  MD scheduled Zofran for 6 AM daily.  Teaching Needs: Family education, medication management, pain management, bowel/bladder management, teach family  I&O caths, transfer training, gait training, balance training, endurance training, BP management, safety awareness.  Current Barriers to Discharge: Decreased caregiver support, Medical stability, Home enviroment access/layout, Incontinence, Neurogenic bowel and bladder, Lack of/limited  family support, Weight, and Medication compliance  Possible Resolutions to Barriers: Continue current medications for pain management, educate family on bowel/bladder management, educate family on I&O caths, teach BP management, teach ROM, provide emotional support.     Medical Summary Current Status: ICH- requiring in/out caths- no voiding at all; incontinent of bowel; tylenol for pain prn  Barriers to Discharge: Decreased family/caregiver support;Behavior;Home enviroment access/layout;Incontinence;Neurogenic Bowel & Bladder;Medical stability;Weight;Other (comments)  Barriers to Discharge Comments: orthostatic hypotension and resting low BP; husband works home; and daughter to help as well? 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; needs abd binder; 10/19 d/c   Continued Need for Acute Rehabilitation Level of Care: The patient requires daily medical management by a physician with specialized training in physical medicine and rehabilitation for the following reasons: Direction of a multidisciplinary physical rehabilitation program to maximize functional independence : Yes Medical management of patient stability for increased activity during participation in an intensive rehabilitation regime.: Yes Analysis of laboratory values and/or radiology reports with any subsequent need for medication adjustment and/or medical intervention. : Yes   I attest that I was present, lead the team conference, and concur with the assessment and plan of the team.   Cristi Loron 12/14/2020, 5:10 PM

## 2020-12-14 NOTE — Progress Notes (Signed)
Occupational Therapy Session Note  Patient Details  Name: Felicia Chen MRN: [id number] Date of Birth: 1965/12/28  Today's Date: 12/14/2020 OT Individual Time: 8889-1694 OT Individual Time Calculation (min): 57 min    Short Term Goals: Week 1:  OT Short Term Goal 1 (Week 1): Pt will perform sit <> stand at LRAD with MOD of 1 OT Short Term Goal 2 (Week 1): Pt will thread BLEs into pants with AE PRN OT Short Term Goal 3 (Week 1): Pt will perform BSC/toilet transfer with MOD of 1 OT Short Term Goal 4 (Week 1): Pt will perform self ROM for RUE with no more than Min cues   Skilled Therapeutic Interventions/Progress Updates:    Pt greeted at time of session semireclined finishing eating breakfast, no pain reported. Switched R resting hand splint for night to day splint. Donned TEDS and socks bed level prior to activity. Donned pants Max/total A with therapist threading BLEs and having pt pull with LUE to thigh level and helping with over hips. Note when rolling L/R pt needing extended time, simple phrasing to sequence rolling and following commands. Supine > sit Min A and BP checked in sitting 143/82, Stedy transfer bed > TIS chair and BP perched in [place] 125/83. No dizziness reported during this time. After med pass from nursing, transported to gym and focused on sit <> stand at high/low table with R knee block and Mod A of 1 to come up to standing, able to stand for 1-1:30, attempting small lateral weight shifts as well. Returned to room and nursing needing to cath, Stedy back to bed. Sit > supine same as above. Alarm on call bell in reach and daughter present. RUE elevated on pillows.    Therapy Documentation Precautions:  Precautions Precautions: Fall Precaution Comments: R hemi, inattention. SBP parameters <160 per neuro on acute Restrictions Weight Bearing Restrictions: No    Therapy/Group: Individual Therapy  [name] 12/14/2020, 7:14 AM

## 2020-12-14 NOTE — Progress Notes (Signed)
Physical Therapy Session Note  Patient Details  Name: Felicia Chen MRN: [id number] Date of Birth: Apr 13, 1965  Today's Date: 12/14/2020 PT Individual Time: 9381-0175,1025-8527 PT Individual Time Calculation (min): 30 min, 60 min  Short Term Goals: Week 1:  PT Short Term Goal 1 (Week 1): Pt will initiate gait training PT Short Term Goal 2 (Week 1): Pt will perform STS with mod A of 1 PT Short Term Goal 3 (Week 1): Pt will tolerate OOB sitting between therapy sessions  Skilled Therapeutic Interventions/Progress Updates:    Session 1: pt received in bed and agreeable to therapy. No complaint of pain.  BP as follows throughout session: Supine: 111/68 Sitting: 102/75 Standing: 97/57 Sitting after activity: 109/80   Session focused on standing tolerance and BP management. Supine>sit with mod A.Pt directed in Sit to stand with mod A x 1, with +2 for safety. Therapist facilitated weightbearing through R side and provided knee block. Pt continually demoed preference for standing on L side and could not motor plan weight shift to R side. Pt then performed squats x 5 for improved LE strength and control. Squat pivot transfer to L side to TIS chair with mod A of 1, +2 for safety, therapist blocking knees and facilitating weight shift. Pt remained in [place] chair at end of session for OOB tolerance and was left with all needs in reach and alarm active.   Session 2: pt received in bed and agreeable to therapy. No complaint of pain. Pt's daughter reported she had stayed in chair ~1 hour before having orthostatic symptoms and returning to bed. BP in supine: 108/61.Supine>sit with max A d/t poor sequencing. Pt performed squat pivot with mod A to TIS chair with +2 for safety. Pt transported to hall for time management and energy conservation. Pt stood with mod A at hallway rail with knee block and manual facilitation at pelvis and knee. Pt was unable to coordinate weight shift toward R side with several  attempts and multimodal cues, including mirror. Gait training 3 x 8 ft with hall way rail and max A and w/c follow, extended seated rest breaks for BP management. Pt demoed external rotation of RLE, poor weight shift to R side, knee buckling, poor trunk control. Upon standing again, pt had instance of orthostatics and return to TIS, symptoms subsided. Returned to pt's room to return to bed. Pt reported increased orthostatic symptoms after performing mod A squat pivot transfer to R side. Pt reclined at foot of bed until symptoms subsided long enough to scoot toward [facility] and return to bed. Sit>supine with min A, dependent scooting to HOB. BP noted to be 99/66 in supine, nsg made aware. Pt left with legs elevated to improve BP and was left with all needs in reach and alarm active.   Therapy Documentation Precautions:  Precautions Precautions: Fall Precaution Comments: R hemi, inattention. SBP parameters <160 per neuro on acute Restrictions Weight Bearing Restrictions: No     Therapy/Group: Individual Therapy  Mickel Fuchs 12/14/2020, 4:56 PM

## 2020-12-14 NOTE — Progress Notes (Signed)
Patient ID: Felicia Chen, female   DOB: 1965/04/20, 55 y.o.   MRN: [id number] Met with pt, husband and daughter-Felicia Chen to update regarding team conference goals of min-mod level of assist and target discharge date of 10/19. Pt is wanting to be as little care as possible so will stay however long team feels she needs too. Husband and daughter somewhat concerned regarding progress and the level of care she will require at discharge. Hopefully will make more gains and not require mod assist level. Pt does not seem to fully understand the amount of care she will require at discharge. Aware due to BP issues have lost some time in therapies. Will work toward discharge needs and ask neuro-psych to see while here.

## 2020-12-15 LAB — COMPREHENSIVE METABOLIC PANEL
ALT: 60 U/L — ABNORMAL HIGH (ref 0–44)
AST: 21 U/L (ref 15–41)
Albumin: 3.4 g/dL — ABNORMAL LOW (ref 3.5–5.0)
Alkaline Phosphatase: 65 U/L (ref 38–126)
Anion gap: 6 (ref 5–15)
BUN: 16 mg/dL (ref 6–20)
CO2: 27 mmol/L (ref 22–32)
Calcium: 9.2 mg/dL (ref 8.9–10.3)
Chloride: 102 mmol/L (ref 98–111)
Creatinine, Ser: 0.82 mg/dL (ref 0.44–1.00)
GFR, Estimated: >60 mL/min (ref >60)
Glucose, Bld: 115 mg/dL — ABNORMAL HIGH (ref 70–99)
Potassium: 4.2 mmol/L (ref 3.5–5.1)
Sodium: 135 mmol/L (ref 135–145)
Total Bilirubin: 2.1 mg/dL — ABNORMAL HIGH (ref 0.3–1.2)
Total Protein: 6.6 g/dL (ref 6.5–8.1)

## 2020-12-15 LAB — CBC WITH DIFFERENTIAL/PLATELET
Abs Immature Granulocytes: 0.07 10*3/uL (ref 0.00–0.07)
Basophils Absolute: 0.1 10*3/uL (ref 0.0–0.1)
Basophils Relative: 1 %
Eosinophils Absolute: 0.3 10*3/uL (ref 0.0–0.5)
Eosinophils Relative: 3 %
HCT: 46.9 % — ABNORMAL HIGH (ref 36.0–46.0)
Hemoglobin: 15.8 g/dL — ABNORMAL HIGH (ref 12.0–15.0)
Immature Granulocytes: 1 %
Lymphocytes Relative: 25 %
Lymphs Abs: 2.7 10*3/uL (ref 0.7–4.0)
MCH: 30.5 pg (ref 26.0–34.0)
MCHC: 33.7 g/dL (ref 30.0–36.0)
MCV: 90.5 fL (ref 80.0–100.0)
Monocytes Absolute: 0.9 10*3/uL (ref 0.1–1.0)
Monocytes Relative: 8 %
Neutro Abs: 7 10*3/uL (ref 1.7–7.7)
Neutrophils Relative %: 62 %
Platelets: 313 10*3/uL (ref 150–400)
RBC: 5.18 MIL/uL — ABNORMAL HIGH (ref 3.87–5.11)
RDW: 12.2 % (ref 11.5–15.5)
WBC: 11 10*3/uL — ABNORMAL HIGH (ref 4.0–10.5)
nRBC: 0 % (ref 0.0–0.2)

## 2020-12-15 NOTE — Progress Notes (Signed)
Occupational Therapy Session Note  Patient Details  Name: Felicia Chen MRN: [id number] Date of Birth: November 04, 1965  Today's Date: 12/15/2020 OT Individual Time: 0300-9233 OT Individual Time Calculation (min): 56 min    Short Term Goals: Week 1:  OT Short Term Goal 1 (Week 1): Pt will perform sit <> stand at LRAD with MOD of 1 OT Short Term Goal 2 (Week 1): Pt will thread BLEs into pants with AE PRN OT Short Term Goal 3 (Week 1): Pt will perform BSC/toilet transfer with MOD of 1 OT Short Term Goal 4 (Week 1): Pt will perform self ROM for RUE with no more than Min cues  Skilled Therapeutic Interventions/Progress Updates:      Vestibular Assessment - 12/15/20 0001       Vestibular Assessment   General Observation Pt in bed resting      Symptom Behavior   Subjective history of current problem Pt reports some dizziness since CVA.  She reports noticing it more when she is rolling in the bed. Noted bruising to the right side of her face.    Type of Dizziness  Spinning;"Funny feeling in head"    Frequency of Dizziness On and off with positional changes, mostly rolling to the left side    Duration of Dizziness less than 30 seconds    Symptom Nature Motion provoked    Aggravating Factors Turning body quickly;Turning head quickly    Relieving Factors Head stationary    Progression of Symptoms Worse    History of similar episodes Pt reports history of having vertigo in the past      Oculomotor Exam   Oculomotor Alignment Normal    Ocular ROM WNLs    Spontaneous Absent    Comment Pt with some lost fixation with smooth pursuits, but more due to sustained attention deficit.      Positional Testing   Dix-Hallpike Dix-Hallpike Left      Dix-Hallpike Left   Dix-Hallpike Left Duration 10 seconds    Dix-Hallpike Left Symptoms Upbeat, right rotatory nystagmus      Cognition   Cognition Orientation Level Oriented to person;Oriented to place;Oriented to time    Cognition Comment  Deficits related to CVA, increased difficulty following some specific directions to keep her eyes focused at midline and to not close them.      Orthostatics   BP supine (x 5 minutes) 101/66            Completed left Epley Maneuver for correction of right posterior BPPV with assist from another OT.  After completion of maneuver and re-testing, no nystagmus was noted and no further dizziness with completion of Marye Round X2 on the left and once on the right.  Pt required total assist +2 (pt 40%) for completion of supine to long sitting in bed for testing as well as for transition from sidelying on the right side to sitting.  Max assist for rolling to the left with supervision for rolling to the right.  Completed sit to stand X1 for transition from the foot of the bed back to the middle with max assist.  Max facilitation to take 3-4 small steps up toward the top of the bed before transitioning back to supine.  Once session was finished, she was left in the bed with the call button and phone in reach and safety alarm in place.    Therapy Documentation Precautions:  Precautions Precautions: Fall Precaution Comments: R hemi, inattention. SBP parameters <160 per neuro on acute Restrictions  Weight Bearing Restrictions: No   Pain: Pain Assessment Pain Scale: Faces Pain Score: 0-No pain ADL: See Care Tool Section for some details of mobility and selfcare   Therapy/Group: Individual Therapy  Felicia Chen OTR/L 12/15/2020, 4:50 PM

## 2020-12-15 NOTE — Progress Notes (Signed)
Physical Therapy Weekly Progress Note  Patient Details  Name: Felicia Chen MRN: [id number] Date of Birth: [birth year]  Beginning of progress report period: December 08, 2020 End of progress report period: December 15, 2020  Today's Date: 12/15/2020 PT Individual Time: 1100-1200 PT Individual Time Calculation (min): 60 min   Patient has met 3 of 3 short term goals.  Supine to sit via R sidelying with min A at times. Squat pivot with mod A. Initiating gait at hall way rail, max A with close w/c follow. Severely limited by orthostatic symptoms, with syncopal episodes noted during CIR admission.   Patient continues to demonstrate the following deficits muscle weakness, decreased cardiorespiratoy endurance, impaired timing and sequencing, abnormal tone, unbalanced muscle activation, decreased coordination, and decreased motor planning, and hemiplegia and therefore will continue to benefit from skilled PT intervention to increase functional independence with mobility.  Patient progressing toward long term goals..  Continue plan of care.  PT Short Term Goals Week 1:  PT Short Term Goal 1 (Week 1): Pt will initiate gait training PT Short Term Goal 1 - Progress (Week 1): Met PT Short Term Goal 2 (Week 1): Pt will perform STS with mod A of 1 PT Short Term Goal 2 - Progress (Week 1): Met PT Short Term Goal 3 (Week 1): Pt will tolerate OOB sitting between therapy sessions PT Short Term Goal 3 - Progress (Week 1): Met Week 2:  PT Short Term Goal 1 (Week 2): Pt will demonstrate STS with RW and mod A PT Short Term Goal 2 (Week 2): Pt's will perform standing mobility x 2 min without orthostatic symptoms. PT Short Term Goal 3 (Week 2): Pt will perform gait with max A x 10 ft  Skilled Therapeutic Interventions/Progress Updates:  Ambulation/gait training;Cognitive remediation/compensation;Discharge planning;Functional mobility training;Pain management;DME/adaptive equipment  instruction;Psychosocial support;UE/LE Strength taining/ROM;Therapeutic Activities;Visual/perceptual remediation/compensation;Balance/vestibular training;Community reintegration;Disease management/prevention;Neuromuscular re-education;Patient/family education;Skin care/wound management;Stair training;Therapeutic Exercise;Wheelchair propulsion/positioning;UE/LE Coordination activities;Splinting/orthotics   pt received in bed and agreeable to therapy with her daughter present. Supine > sit with min A and max VC for technique. Pt reported mild-moderate orthostatic symptoms while sitting EOB, resolved when reclined in TIS. Squat pivot with mod A to TIS chair. Session focused on BP management and gait training at hall way rail. BP noted as follows: Seated EOB: 93/74 Reclined  in TIS: 108/62 Seated in TIS 98/57 Seated after activity with binder: 103/66 Seated after activity with binder 95/58 Pt required mod A to stand at rail with knee block and trunk support. Gait x 3 bouts of 6-10 ft with max A to manage RLE. Pt required max multimodal cues for motor planning and coordination. Pt responded well to startle cues and UE motion to raise BP and maintain alertness. Pt returned to room after session and remained in TIS, reclined for safety. Educated pt and daughter to recline further or request help to return to bed if needed. Pt was left with all needs in reach and alarm active.    Therapy Documentation Precautions:  Precautions Precautions: Fall Precaution Comments: R hemi, inattention. SBP parameters <160 per neuro on acute Restrictions Weight Bearing Restrictions: No   Therapy/Group: Individual Therapy  Mickel Fuchs 12/15/2020, 12:48 PM

## 2020-12-15 NOTE — Progress Notes (Signed)
Occupational Therapy Session Note  Patient Details  Name: Felicia Chen MRN: [id number] Date of Birth: [birth year]  Today's Date: 12/15/2020 OT Individual Time: 0263-7858 OT Individual Time Calculation (min): 56 min    Short Term Goals: Week 1:  OT Short Term Goal 1 (Week 1): Pt will perform sit <> stand at LRAD with MOD of 1 OT Short Term Goal 2 (Week 1): Pt will thread BLEs into pants with AE PRN OT Short Term Goal 3 (Week 1): Pt will perform BSC/toilet transfer with MOD of 1 OT Short Term Goal 4 (Week 1): Pt will perform self ROM for RUE with no more than Min cues   Skilled Therapeutic Interventions/Progress Updates:    Pt greeted at time of session semireclined in bed resting, agreeable to OT session and no pain at rest. At end of session pt c/o R shoulder discomfort/soreness. Note throughout session pt with difficulty expressing needs/discomfort and overall verbal communication, requiring extended time to problem solve needs and communicate. In supine, donned TEDS and socks dependent, Max A for LB dressing pants only with pt able to help pull over hips partially bridging and rolling. Supine > sit Mod A and squat pivot to L side with Mod/Max A and set up at sink. Oral hygiene Min A for physical assist and Max multimodal cues, unable to sequence spitting in dish trying to drink water despite max cues. Transported to gym and practiced standing at high/low table with Mod/Max for sit > stand x3 trials. BP readings are as follows: sitting EOB initially 151/117, after squat pivot: 130/65, s/p standing at table 120/76. Despite readings, pt stating in standing that she felt "dizzy" and unable to verbalize how dizzy but could answer "a little, medium, or a lot." Returned to room and squat pivot TIS > bed Max A. Sit > supine Min/Mod. Hand off to nursing for cathing.   Therapy Documentation Precautions:  Precautions Precautions: Fall Precaution Comments: R hemi, inattention. SBP parameters <160  per neuro on acute Restrictions Weight Bearing Restrictions: No     Therapy/Group: Individual Therapy  [name] 12/15/2020, 7:16 AM

## 2020-12-15 NOTE — Progress Notes (Signed)
Occupational Therapy Session Note  Patient Details  Name: Felicia Chen MRN: [id number] Date of Birth: February 29, 1966  Today's Date: 12/15/2020 OT Individual Time: 1400-1430 OT Individual Time Calculation (min): 30 min    Short Term Goals: Week 1:  OT Short Term Goal 1 (Week 1): Pt will perform sit <> stand at LRAD with MOD of 1 OT Short Term Goal 2 (Week 1): Pt will thread BLEs into pants with AE PRN OT Short Term Goal 3 (Week 1): Pt will perform BSC/toilet transfer with MOD of 1 OT Short Term Goal 4 (Week 1): Pt will perform self ROM for RUE with no more than Min cues   Skilled Therapeutic Interventions/Progress Updates:  Patient met in perched position in [name] with nursing staff. Patient reporting need to return to supine 2/2 dizziness. Request for bed level therapy this afternoon. Session with focus on self-ROM and PROM of RUE. Education provided to daughter on RUE PROM including hand placement to support UE one joint above and below for each exercise. Daughter expressed verbal understanding. Saebo e-stim applied to R shoulder for 30 minutes (15 attended, 15 unattended) to restore function and decrease risk of subluxation. Session concluded with patient lying supine in bed with call bell within reach, bed alarm activated and all needs met. RN present for med administration.   Therapy Documentation Precautions:  Precautions Precautions: Fall Precaution Comments: R hemi, inattention. SBP parameters <160 per neuro on acute Restrictions Weight Bearing Restrictions: No General:    Therapy/Group: Individual Therapy  [name] 12/15/2020, 12:36 PM

## 2020-12-15 NOTE — Progress Notes (Signed)
[place] [place] PHYSICAL MEDICINE & REHABILITATION PROGRESS NOTE  Subjective/Complaints:  Just waking up- Zofran at 6am- helped a lot.  WBC down to 11k- from 17.4k.   Feeling better  ROS:   Pt denies SOB, abd pain, CP, N/V/C/D, and vision changes   Objective: Vital Signs: Blood pressure 100/61, pulse 85, temperature 98.5 F (36.9 C), temperature source Oral, resp. rate 16, height 5' 7.99" (1.727 m), weight 104.3 kg, SpO2 97 %. No results found. Recent Labs    12/15/20 0524  WBC 11.0*  HGB 15.8*  HCT 46.9*  PLT 313    Recent Labs    12/15/20 0524  NA 135  K 4.2  CL 102  CO2 27  GLUCOSE 115*  BUN 16  CREATININE 0.82  CALCIUM 9.2     Intake/Output Summary (Last 24 hours) at 12/15/2020 0801 Last data filed at 12/15/2020 0100 Gross per 24 hour  Intake 480 ml  Output 1350 ml  Net -870 ml        Physical Exam: BP 100/61 (BP Location: Left Arm)   Pulse 85   Temp 98.5 F (36.9 C) (Oral)   Resp 16   Ht 5' 7.99" (1.727 m)   Wt 104.3 kg   SpO2 97%   BMI 34.97 kg/m     General: awake, alert, appropriate, sitting up in bed; NAD HENT: conjugate gaze; oropharynx moist CV: regular rate; no JVD Pulmonary: CTA B/L; no W/R/R- good air movement GI: soft, NT, ND, (+)BS Psychiatric: appropriate Neurological: Ox3  Extremities: No clubbing, cyanosis, or edema Skin: No evidence of breakdown, no evidence of rash A and O x 3   She does some difficulty differentiating right from left. Motor: RUE/RLE: 0/5 proximal distal- unchanged.  Sensation diminished to light touch RUE/RLE   Assessment/Plan: 1. Functional deficits which require 3+ hours per day of interdisciplinary therapy in a comprehensive inpatient rehab setting. Physiatrist is providing close team supervision and 24 hour management of active medical problems listed below. Physiatrist and rehab team continue to assess barriers to discharge/monitor patient progress toward functional and medical goals   Care  Tool:  Bathing    Body parts bathed by patient: Right arm, Chest, Abdomen, Right upper leg, Left upper leg, Face   Body parts bathed by helper: Left arm, Front perineal area, Buttocks, Right lower leg, Left lower leg     Bathing assist Assist Level: Total Assistance - Patient < 25%     Upper Body Dressing/Undressing Upper body dressing   What is the patient wearing?: Pull over shirt    Upper body assist Assist Level: Maximal Assistance - Patient 25 - 49%    Lower Body Dressing/Undressing Lower body dressing      What is the patient wearing?: Pants     Lower body assist Assist for lower body dressing: Maximal Assistance - Patient 25 - 49%     Toileting Toileting    Toileting assist Assist for toileting: Minimal Assistance - Patient > 75%     Transfers Chair/bed transfer  Transfers assist     Chair/bed transfer assist level: Maximal Assistance - Patient 25 - 49% (squat pivot toward L)     Locomotion Ambulation   Ambulation assist   Ambulation activity did not occur: Safety/medical concerns          Walk 10 feet activity   Assist  Walk 10 feet activity did not occur: Safety/medical concerns        Walk 50 feet activity   Assist Walk 50  feet with 2 turns activity did not occur: Safety/medical concerns         Walk 150 feet activity   Assist Walk 150 feet activity did not occur: Safety/medical concerns         Walk 10 feet on uneven surface  activity   Assist Walk 10 feet on uneven surfaces activity did not occur: Safety/medical concerns         Wheelchair     Assist Is the patient using a wheelchair?: Yes   Wheelchair activity did not occur: Safety/medical concerns         Wheelchair 50 feet with 2 turns activity    Assist    Wheelchair 50 feet with 2 turns activity did not occur: Safety/medical concerns       Wheelchair 150 feet activity     Assist  Wheelchair 150 feet activity did not occur:  Safety/medical concerns        Medical Problem List and Plan: 1.  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  Impaired mobility -DVT/anticoagulation:  Pharmaceutical: Continue Heparin             -antiplatelet therapy: N/A 3. Pain Management: As needed meds 4. Anxiety: Continue Xanax 0.5 mg 3 times daily, BuSpar 5 mg twice daily increase to TID , hydroxyzine 25 mg 4 times daily- d/c atarax due to urinary retention             -antipsychotic agents: N/A 5. Neuropsych: This patient is not fully capable of making decisions on her own behalf. 6. Skin/Wound Care: Routine skin checks 7. Fluids/Electrolytes/Nutrition: Routine in and outs 8.  Hypertension.  Continue Norvasc 5 mg daily, Cozaar 50 mg twice daily.               Monitor with increased mobility Vitals:   12/14/20 2019 12/15/20 0517  BP: 111/68 100/61  Pulse: 84 85  Resp: 18 16  Temp: 98.7 F (37.1 C) 98.5 F (36.9 C)  SpO2: 90% 97%  Running on low side reduce cozaar to 25mg    9/28- use Abd binder and add for low BP.  9.  Hyperlipidemia: Lipitor 10.  COVID-19.  Completed course of remdesivir.  Currently completing course of dexamethasone 11.  Obesity.  BMI 39.52.  Dietary follow-up.  Encourage weight loss 12.  Leukocytosis-likely secondary to steroids             Afebrile, no signs/symptoms of infection  9/27- no labs yesterday- will check in AM  9/28- WBC down to 11k- con't to monitor weekly.  13.  Transaminitis  ALT elevated on 9/21, continue to monitor  9/27- will recheck CMP in AM 14. Orthostatic hypotension: continue TEDs, added abdominal binder in case this is not enough. Improved  9/28- adding abd binder and will wait on Florinef for today, but will see if still limiting-  15.  Urinary retention add urecholine , now off anticholinergic agents , BP too low for flomax also with  orthostasis   9/27- cannot add Flomax due to low BP- is NOT voiding at all- all in/out caths-  16. Nausea  9/27- will schedule Zofran 4 mg at 6am daily to help with nausea and monitor   9/28- doing better- con't regimen.    LOS: 8 days A FACE TO FACE EVALUATION WAS PERFORMED  Gracin Soohoo 12/15/2020, 8:01 AM

## 2020-12-15 NOTE — Progress Notes (Signed)
Orthopedic Tech Progress Note Patient Details:  VIRDIA ZIESMER [birth year] [id number]  RN called requesting an ABDOMINAL BINDER. THERAPY was working with patient. Gave it to her to apply   Ortho Devices Type of Ortho Device: Abdominal binder Ortho Device/Splint Location: STOMACH Ortho Device/Splint Interventions: Other (comment), Ordered   Post Interventions Patient Tolerated: Well Instructions Provided: Care of device  Janit Pagan 12/15/2020, 12:40 PM

## 2020-12-16 MED ORDER — FLUDROCORTISONE ACETATE 0.1 MG PO TABS
0.1000 mg | ORAL_TABLET | Freq: Every day | ORAL | Status: DC
  Administered 2020-12-16 – 2020-12-21 (×6): 0.1 mg via ORAL
  Filled 2020-12-16 (×6): qty 1

## 2020-12-16 MED ORDER — CITALOPRAM HYDROBROMIDE 20 MG PO TABS
20.0000 mg | ORAL_TABLET | Freq: Every day | ORAL | Status: DC
  Administered 2020-12-16 – 2021-01-05 (×21): 20 mg via ORAL
  Filled 2020-12-16: qty 2
  Filled 2020-12-16: qty 1
  Filled 2020-12-16 (×6): qty 2
  Filled 2020-12-16 (×3): qty 1
  Filled 2020-12-16: qty 2
  Filled 2020-12-16: qty 1
  Filled 2020-12-16: qty 2
  Filled 2020-12-16: qty 1
  Filled 2020-12-16 (×3): qty 2
  Filled 2020-12-16: qty 1
  Filled 2020-12-16 (×2): qty 2

## 2020-12-16 NOTE — Evaluation (Signed)
Recreational Therapy Assessment and Plan  Patient Details  Name: Felicia Chen MRN: [id number] Date of Birth: [birth year] Today's Date: 12/16/2020  Rehab Potential:  Good ELOS:   10/19  Assessment Hospital Problem: Principal Problem:   ICH (intracerebral hemorrhage) ([place]) Active Problems:   Transaminitis   Right hemiplegia ([place])     Past Medical History:      Past Medical History:  Diagnosis Date   Anxiety     Gilbert's syndrome      Past Surgical History:       Past Surgical History:  Procedure Laterality Date   ABDOMINAL HYSTERECTOMY N/A 06/11/2012    Procedure: HYSTERECTOMY ABDOMINAL;  Surgeon: Allena Katz, MD;  Location: [facility];  Service: Gynecology;  Laterality: N/A;   CESAREAN SECTION        x 4   CYSTOSCOPY N/A 06/11/2012    Procedure: CYSTOSCOPY;  Surgeon: Allena Katz, MD;  Location: [facility];  Service: Gynecology;  Laterality: N/A;   HERNIA REPAIR       LAPAROSCOPIC ASSISTED VAGINAL HYSTERECTOMY N/A 06/11/2012    Procedure: LAPAROSCOPIC ASSISTED VAGINAL HYSTERECTOMY atttempted;  Surgeon: Allena Katz, MD;  Location: [facility];  Service: Gynecology;  Laterality: N/A;  Attempted Laparoscopic assisted vaginal hysterectomy.   LAPAROSCOPIC LYSIS OF ADHESIONS N/A 06/11/2012    Procedure: LAPAROSCOPIC LYSIS OF ADHESIONS;  Surgeon: Allena Katz, MD;  Location: [facility];  Service: Gynecology;  Laterality: N/A;   SALPINGOOPHORECTOMY Bilateral 06/11/2012    Procedure: SALPINGO OOPHORECTOMY;  Surgeon: Allena Katz, MD;  Location: [facility];  Service: Gynecology;  Laterality: Bilateral;      Assessment & Plan Clinical Impression: Felicia Chen is a 55 year old right-handed female with history of hyperlipidemia, anxiety, Gilbert's syndrome endometriosis status post TAH/BSO 2014, obesity with BMI 39.5 and recent COVID-19.  History taken from patient and chart review due to cognition patient lives with spouse.  1 level home 2 steps to entry.   Independent prior to admission provides assistance for her 9 year old mother.  She presented on 12/01/2020 with increasing headache, right side weakness and neglect.  CT angiogram head and neck showed 60 cc left frontal parietal lobar hematoma with regional subarachnoid extension.  No spot sign or discrete vascular lesion underlying the lobar hematoma.  Mild atherosclerosis in the neck.  CT venogram of the head negative for dural venous sinus thrombosis.  MRI showed overall stable size of large left parietal intraparenchymal hematoma with subarachnoid hemorrhage overlying the bilateral cerebral hemispheres and cerebellum.  No evidence of intraventricular extension or hemorrhage.  EEG negative for seizures.  Echocardiogram with ejection fraction of 60-65%, no wall motion abnormality grade 1 diastolic dysfunction.  Patient did experience hypoxic respiratory failure on admission and monitored by critical care medicine.  Admission chemistries unremarkable except potassium 3.4, glucose 130, alcohol negative, urine drug screen negative, hemoglobin 15.2.  Hypertonic saline initiated.  Placed on Cleviprex for blood pressure control.  Neurology follow-up with conservative care.  Consideration made to repeat CTA in 2 weeks to reevaluate cerebral vasoconstriction versus atherosclerotic stenosis.  Hospital course patient did have a fall while using bedside commode 12/05/2020 sustaining right facial and eyelid bruising with swelling and cranial CT scan showed no progression of the left cerebral hematoma with mild subarachnoid extension.  No hydrocephalus or midline shift.  She was cleared to begin subcutaneous heparin for DVT prophylaxis.  In regards to patient's [id number] she did complete course of Remdesivir and weaned from dexamethasone.  Therapy  evaluations completed due to patient's right-sided hemiparesis with sensory deficits was admitted for a comprehensive rehab program.  Please see preadmission assessment from earlier  today as well. Patient transferred to CIR on 12/07/2020 .     Met with pt today to discuss TR services, leisure interests, activity analysis/modifications & stress management.  One daughter present briefly during discussion. Pt presents with decreased activity tolerance, decreased functional mobility, decreased balance, decreased coordination, decreased attention, decreased awareness, decreased problem solving, and decreased memory, feelings of stress/anxiety Limiting pt's independence with leisure/community pursuits.   Plan  Min 1 TR session >20 minutes per week during LOS  Recommendations for other services: Neuropsych  Discharge Criteria: Patient will be discharged from TR if patient refuses treatment 3 consecutive times without medical reason.  If treatment goals not met, if there is a change in medical status, if patient makes no progress towards goals or if patient is discharged from hospital.  The above assessment, treatment plan, treatment alternatives and goals were discussed and mutually agreed upon: by patient  [place] 12/16/2020, 3:08 PM

## 2020-12-16 NOTE — Progress Notes (Signed)
Occupational Therapy Session Note  Patient Details  Name: Felicia Chen MRN: [id number] Date of Birth: 01-02-1966  Today's Date: 12/16/2020 OT Individual Time: 1300-1330 OT Individual Time Calculation (min): 30 min    Short Term Goals: Week 1:  OT Short Term Goal 1 (Week 1): Pt will perform sit <> stand at LRAD with MOD of 1 OT Short Term Goal 1 - Progress (Week 1): Met OT Short Term Goal 2 (Week 1): Pt will thread BLEs into pants with AE PRN OT Short Term Goal 2 - Progress (Week 1): Progressing toward goal OT Short Term Goal 3 (Week 1): Pt will perform BSC/toilet transfer with MOD of 1 OT Short Term Goal 3 - Progress (Week 1): Progressing toward goal OT Short Term Goal 4 (Week 1): Pt will perform self ROM for RUE with no more than Min cues OT Short Term Goal 4 - Progress (Week 1): Progressing toward goal  Skilled Therapeutic Interventions/Progress Updates:     Pt received in bed with no pain fatigued but agreeable to OT.   Therapeutic activity Pt sup>sit with MOD A for trunk and LE management. Seated bean bag toss at EOB with pt reaching in mod-max ranges outside BOS with lateral weight shifting/crossing midline with LUE to WB into R forearm on OT thigh for deep proprioceptive input. Pt often needs cuing to throw bean bag after retrieving it from far distance demo poor carryover of 2 step commands. MAX LOB when leaning and reachign far R, however good trunk activation when reaching L with R obliques to correct midline  Saebo Stim One 330 pulse width 35 Hz pulse rate On 8 sec/ off 8 sec Ramp up/ down 2 sec Symmetrical Biphasic wave form  Max intensity 1104mA at 500 Ohm load Deltoid stim with good contraction and skin in tact from end of stim session per PT verbal report  Pt left at end of session in bed with exit alarm on, call light in reach and all needs met   Therapy Documentation Precautions:  Precautions Precautions: Fall Precaution Comments: R hemi, inattention.  SBP parameters <160 per neuro on acute Restrictions Weight Bearing Restrictions: No General:   Vital Signs: Therapy Vitals Temp: 98.4 F (36.9 C) Temp Source: Oral Pulse Rate: 77 Resp: 16 BP: 95/60 Patient Position (if appropriate): Lying Oxygen Therapy SpO2: 98 % O2 Device: Room Air   Therapy/Group: Individual Therapy  Tonny Branch 12/16/2020, 7:21 AM

## 2020-12-16 NOTE — Progress Notes (Signed)
Felicia Chen PHYSICAL MEDICINE & REHABILITATION PROGRESS NOTE  Subjective/Complaints:  Pt tearful this AM- admits very depressed- denies more anxiety- is on Buspar and Benzo for anxiety.   Also discussed needing to start Florinef for low BP which is limiting therapy and making it impossible to start Flomax for bladder- stil no voiding.    ROS:   Pt denies SOB, abd pain, CP, N/V/C/D, and vision changes   Objective: Vital Signs: Blood pressure 95/60, pulse 77, temperature 98.4 F (36.9 C), temperature source Oral, resp. rate 16, height 5' 7.99" (1.727 m), weight 104.3 kg, SpO2 98 %. No results found. Recent Labs    12/15/20 0524  WBC 11.0*  HGB 15.8*  HCT 46.9*  PLT 313    Recent Labs    12/15/20 0524  NA 135  K 4.2  CL 102  CO2 27  GLUCOSE 115*  BUN 16  CREATININE 0.82  CALCIUM 9.2     Intake/Output Summary (Last 24 hours) at 12/16/2020 0849 Last data filed at 12/16/2020 0250 Gross per 24 hour  Intake 240 ml  Output 1100 ml  Net -860 ml        Physical Exam: BP 95/60 (BP Location: Left Arm)   Pulse 77   Temp 98.4 F (36.9 C) (Oral)   Resp 16   Ht 5' 7.99" (1.727 m)   Wt 104.3 kg   SpO2 98%   BMI 34.97 kg/m      General: awake, alert, appropriate, sitting up in bed; daughter at bedside;  NAD HENT: conjugate gaze; oropharynx moist CV: regular rate; no JVD Pulmonary: CTA B/L; no W/R/R- good air movement GI: soft, NT, ND, (+)BS Psychiatric: appropriate but intermittently crying a lot.  Neurological: alert- mild receptive aphasia.   Extremities: No clubbing, cyanosis, or edema Skin: No evidence of breakdown, no evidence of rash A and O x 3   She does some difficulty differentiating right from left. Motor: RUE/RLE: 0/5 proximal distal- RUE 0/5 still- wearing R wrist resting splint still.  Sensation diminished to light touch RUE/RLE   Assessment/Plan: 1. Functional deficits which require 3+ hours per day of interdisciplinary therapy in a  comprehensive inpatient rehab setting. Physiatrist is providing close team supervision and 24 hour management of active medical problems listed below. Physiatrist and rehab team continue to assess barriers to discharge/monitor patient progress toward functional and medical goals   Care Tool:  Bathing    Body parts bathed by patient: Right arm, Chest, Abdomen, Right upper leg, Left upper leg, Face   Body parts bathed by helper: Left arm, Front perineal area, Buttocks, Right lower leg, Left lower leg     Bathing assist Assist Level: Total Assistance - Patient < 25%     Upper Body Dressing/Undressing Upper body dressing   What is the patient wearing?: Pull over shirt    Upper body assist Assist Level: Maximal Assistance - Patient 25 - 49%    Lower Body Dressing/Undressing Lower body dressing      What is the patient wearing?: Pants     Lower body assist Assist for lower body dressing: Maximal Assistance - Patient 25 - 49%     Toileting Toileting    Toileting assist Assist for toileting: Minimal Assistance - Patient > 75%     Transfers Chair/bed transfer  Transfers assist     Chair/bed transfer assist level: Moderate Assistance - Patient 50 - 74%     Locomotion Ambulation   Ambulation assist   Ambulation activity did not  occur: Safety/medical concerns  Assist level: Maximal Assistance - Patient 25 - 49% Assistive device:  (hall rail) Max distance: 8   Walk 10 feet activity   Assist  Walk 10 feet activity did not occur: Safety/medical concerns        Walk 50 feet activity   Assist Walk 50 feet with 2 turns activity did not occur: Safety/medical concerns         Walk 150 feet activity   Assist Walk 150 feet activity did not occur: Safety/medical concerns         Walk 10 feet on uneven surface  activity   Assist Walk 10 feet on uneven surfaces activity did not occur: Safety/medical concerns         Wheelchair     Assist Is  the patient using a wheelchair?: Yes Type of Wheelchair: Manual Wheelchair activity did not occur: Safety/medical concerns  Wheelchair assist level: Dependent - Patient 0%      Wheelchair 50 feet with 2 turns activity    Assist    Wheelchair 50 feet with 2 turns activity did not occur: Safety/medical concerns       Wheelchair 150 feet activity     Assist  Wheelchair 150 feet activity did not occur: Safety/medical concerns        Medical Problem List and Plan: 1.  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  Impaired mobility -DVT/anticoagulation:  Pharmaceutical: Continue Heparin             -antiplatelet therapy: N/A 3. Pain Management: As needed meds 4. Anxiety/depression: Continue Xanax 0.5 mg 3 times daily, BuSpar 5 mg twice daily increase to TID , hydroxyzine 25 mg 4 times daily- d/c atarax due to urinary retention  9/29- will start Celexa 20 mg daily for severe new onset depression.              -antipsychotic agents: N/A 5. Neuropsych: This patient is not fully capable of making decisions on her own behalf. 6. Skin/Wound Care: Routine skin checks 7. Fluids/Electrolytes/Nutrition: Routine in and outs 8.  Hypertension.  Continue Norvasc 5 mg daily, Cozaar 50 mg twice daily.               Monitor with increased mobility Vitals:   12/15/20 2002 12/16/20 0612  BP: 103/62 95/60  Pulse: 89 77  Resp: 18 16  Temp: 98.3 F (36.8 C) 98.4 F (36.9 C)  SpO2: 94% 98%  Running on low side reduce cozaar to 25mg    9/28- use Abd binder and add for low BP.   9/29- will start Florinef 0.1 mg daily- might also need midodrine?- If BP better in a few days, will start Flomax for urinary retention. Check orthostatics daily.  9.  Hyperlipidemia: Lipitor 10.  COVID-19.  Completed course of remdesivir.  Currently completing course of  dexamethasone 11.  Obesity.  BMI 39.52.  Dietary follow-up.  Encourage weight loss  9/29- BMI 34.97- improving.  12.  Leukocytosis-likely secondary to steroids             Afebrile, no signs/symptoms of infection  9/27- no labs yesterday- will check in AM  9/28- WBC down to 11k- con't to monitor weekly.  13.  Transaminitis  ALT elevated on 9/21, continue to monitor  9/27- will recheck CMP in AM  9/29- LFTs 21/60- doing  better- con't to monitor 14. Orthostatic hypotension: continue TEDs, added abdominal binder in case this is not enough. Improved  9/28- adding abd binder and will wait on Florinef for today, but will see if still limiting-  15.  Urinary retention add urecholine , now off anticholinergic agents , BP too low for flomax also with orthostasis   9/27- cannot add Flomax due to low BP- is NOT voiding at all- all in/out caths-   9/29- will wait a few days on Florinef til starting Flomax.  16. Nausea  9/27- will schedule Zofran 4 mg at 6am daily to help with nausea and monitor   9/28- doing better- con't regimen.    LOS: 9 days A FACE TO FACE EVALUATION WAS PERFORMED  Felicia Chen 12/16/2020, 8:49 AM

## 2020-12-16 NOTE — Progress Notes (Signed)
Speech Language Pathology Daily Session Note  Patient Details  Name: Felicia Chen MRN: [id number] Date of Birth: 1965-10-15  Today's Date: 12/16/2020 SLP Individual Time: 9163-8466 SLP Individual Time Calculation (min): 45 min  Short Term Goals: Week 2: SLP Short Term Goal 1 (Week 2): Pt will demonstrate reading comprehenison at the sentence level with 80% accuracy and min A verbal cues. SLP Short Term Goal 2 (Week 2): Pt will demonstrate recall of daily, novel information/events with supervision A verbal cues for use of memory notebook. SLP Short Term Goal 3 (Week 2): Pt will complete basic-mildly complex tasks with mod A verbal cues. SLP Short Term Goal 4 (Week 2): Pt will self-monitor and self-correct functional and verbal errors with min A verbal cues. SLP Short Term Goal 5 (Week 2): Pt will demonstrate word finding skills in higher level tasks (ex: describing, convergent/divergent naming) with min A verbal cues.  Skilled Therapeutic Interventions:   Patient seen for skilled ST session focusing on receptive and expressive language goals. Patient lying in bed with daughter present in room. She completed receptive language task of identifying real word in list of 3 and was 85% accuracy (34/40). She was able to follow verbal one step directions without cues for 90% accuracy but with same/similar one step printed directions, she required mod-max verbal, modeling, gestural cues, fading to mod A with repeated practice. She was then able to answer printed, biographical What questions (what is your address, etc). During expressive language tasks, patient described verbs at phrase level with initially minimal A verbal cues, improving to supervision A. SLP noted that patient exhibiting less frequent tangential, disorganized language during unstructured and semi-structured language tasks. She continues to benefit from skilled SLP intervention to maximize cognitive-linguistic function prior to  discharge.   Pain Pain Assessment Pain Scale: 0-10 Pain Score: 0-No pain  Therapy/Group: Individual Therapy  Sonia Baller, MA, CCC-SLP Speech Therapy

## 2020-12-16 NOTE — Progress Notes (Signed)
Occupational Therapy Weekly Progress Note  Patient Details  Name: Felicia Chen MRN: [id number] Date of Birth: 1965/11/12  Beginning of progress report period: December 08, 2020 End of progress report period: December 16, 2020  Today's Date: 12/16/2020 OT Individual Time: 1030-1130 and 3299-2426 OT Individual Time Calculation (min): 60 min and 25 min   Patient has met 1 of 4 short term goals.  Pt has been limited during first reporting period 2/2 vestibular issues that recently resolved after vestibular eval and treatment. Pt continued to have difficulty regulating BP as she has been both orthostatic with 1 episode of syncope and several near syncopal episodes. Treatments for this include both thigh high TEDS and abdominal binder as needed. Family has been present for most sessions to visually observe progress. Pt performing squat pivot transfers bed <> wheelchair with Mod/Max of 1 toward L side, max A for UB/LB dressing, and has politely declined bathing tasks. Pt continues to have difficulty with sequencing and motor planning, needing mod-max cues to facilitate. RUE continues to be flaccid, will continue with Saebo and weight bearing to facilitate.   Patient continues to demonstrate the following deficits: muscle weakness, decreased cardiorespiratoy endurance, impaired timing and sequencing, abnormal tone, decreased coordination, and decreased motor planning, decreased attention to right and decreased motor planning, decreased initiation, decreased attention, decreased awareness, decreased problem solving, decreased safety awareness, decreased memory, and delayed processing, and decreased sitting balance, decreased standing balance, decreased postural control, hemiplegia, and decreased balance strategies and therefore will continue to benefit from skilled OT intervention to enhance overall performance with BADL and Reduce care partner burden.  Patient progressing toward long term goals..   Continue plan of care.  OT Short Term Goals Week 1:  OT Short Term Goal 1 (Week 1): Pt will perform sit <> stand at LRAD with MOD of 1 OT Short Term Goal 1 - Progress (Week 1): Met OT Short Term Goal 2 (Week 1): Pt will thread BLEs into pants with AE PRN OT Short Term Goal 2 - Progress (Week 1): Progressing toward goal OT Short Term Goal 3 (Week 1): Pt will perform BSC/toilet transfer with MOD of 1 OT Short Term Goal 3 - Progress (Week 1): Progressing toward goal OT Short Term Goal 4 (Week 1): Pt will perform self ROM for RUE with no more than Min cues OT Short Term Goal 4 - Progress (Week 1): Progressing toward goal Week 2:  OT Short Term Goal 1 (Week 2): Pt will perform BSC/toilet transfer with Mod A of 1 OT Short Term Goal 2 (Week 2): Pt will perform RUE PROM HEP with no more than Min cues OT Short Term Goal 3 (Week 2): Pt will perform sit <> stands in prep for ADL with Min A OT Short Term Goal 4 (Week 2): Pt will perform LB dress w/ Mod A and AE PRN  Skilled Therapeutic Interventions/Progress Updates:    Session 1: Pt greeted at time of session semireclined in bed resting, no pain reported. Donned TEDS total A for time and supine > sit Max A with cues to use LLE to assist RLE. Once EOB, scooting with CGA to EOB. BP checked and 156/70 with no symptoms. Squat pivot to L side Mod A of 1 with 2nd helper for safety. Transported to ortho gym and SCIFIT on level 1 (later increased to level 2) with ace wrap on R hand/wrist and therapist facilitation at elbow and scap for ROM, encouraging pt to engage RUE verbally to initiate movement,  potentially trace felt in shoulder. Transitioned to standing frame and pt sit > stand Min/Mod A in frame and harness used to support standing for pt to laterally weight shift, weight bear thorugh RUE, and attempt towel slides in standing. Pt feeling dizzy, returned to sitting and BP reading 162/141 seated but quickly rechecked and reading 119/77 and HR of 154. HR rechecked  after deep breathing and HR 97. Pt feeling "normal" after deep breathing.   Session 2: Pt greeted at time of session bed level sleeping but easily woken with verbal stimuli. Pt wanting to remain bed level 2/2 fatigue, stating she worked very hard this afternoon. No pain throughout. Focused on RUE NMR for the following: PROM at shoulder, elbow, forearm, wrist and all digits within all planes/movements with static hold at end range. Note increased tone with external rotation. Saebo application at R wrist extensors with directing pt to "open and close" hand when appropriate to promote tenodesis and prevent contracture development. Skin in tact pre and post. See below for details. Pt left in bed resting alarm on call bell in reach with hemi bed positioning.   Saebo Stim One 330 pulse width 35 Hz pulse rate On 8 sec/ off 8 sec Ramp up/ down 2 sec Symmetrical Biphasic wave form Max intensity 171m at 500 Ohm load    Therapy Documentation Precautions:  Precautions Precautions: Fall Precaution Comments: R hemi, inattention. SBP parameters <160 per neuro on acute Restrictions Weight Bearing Restrictions: No    Therapy/Group: Individual Therapy  HViona Gilmore9/29/2022, 7:22 AM

## 2020-12-16 NOTE — Progress Notes (Signed)
Physical Therapy Session Note  Patient Details  Name: Felicia Chen MRN: [id number] Date of Birth: [birth year]  Today's Date: 12/16/2020 PT Individual Time: 1415-1500 PT Individual Time Calculation (min): 45 min   Short Term Goals: Week 1:  PT Short Term Goal 1 (Week 1): Pt will initiate gait training PT Short Term Goal 1 - Progress (Week 1): Met PT Short Term Goal 2 (Week 1): Pt will perform STS with mod A of 1 PT Short Term Goal 2 - Progress (Week 1): Met PT Short Term Goal 3 (Week 1): Pt will tolerate OOB sitting between therapy sessions PT Short Term Goal 3 - Progress (Week 1): Met Week 2:  PT Short Term Goal 1 (Week 2): Pt will demonstrate STS with RW and mod A PT Short Term Goal 2 (Week 2): Pt's will perform standing mobility x 2 min without orthostatic symptoms. PT Short Term Goal 3 (Week 2): Pt will perform gait with max A x 10 ft  Skilled Therapeutic Interventions/Progress Updates:    pt received in bed and agreeable to therapy. No complaint of pain. Pt reports improvements in nausea, but is quite fatigued. Pt with ted hose donned on arrival. Supine>sit min with max Vcs for sequencing. Unable to get BP reading sitting EOB due to equipment issues. Pt symptomatic throughout. Mod A squat pivot to L side to TIS chair, +2 for safety. Pt continues to deny symptoms. Pt then stood at hallway rail x 5 with mod A for RLE and trunk control. Pt directed in stepping L foot forward and back, had difficulty coordinating motion but was able to perform after visual demonstration. Managed BP with binder, UE motion, and continued engagement. Pt often slumped toward wall dejectedly upon sitting, endorsing extreme fatigue. Pt returned to bed with L squat pivot, mod A x2 d/t fatigue. Max A sit supine d/t fatigue. Pt was left with all needs in reach and alarm active.   Therapy Documentation Precautions:  Precautions Precautions: Fall Precaution Comments: R hemi, inattention. SBP parameters <160 per  neuro on acute Restrictions Weight Bearing Restrictions: No     Therapy/Group: Individual Therapy  Mickel Fuchs 12/16/2020, 4:23 PM

## 2020-12-17 MED ORDER — SORBITOL 70 % SOLN
60.0000 mL | Freq: Once | Status: AC
  Administered 2020-12-17: 60 mL via ORAL
  Filled 2020-12-17: qty 60

## 2020-12-17 NOTE — Progress Notes (Signed)
Speech Language Pathology Daily Session Note  Patient Details  Name: Felicia Chen MRN: [id number] Date of Birth: 1965/12/15  Today's Date: 12/17/2020 SLP Individual Time: 32-1530 SLP Individual Time Calculation (min): 55 min  Short Term Goals: Week 2: SLP Short Term Goal 1 (Week 2): Pt will demonstrate reading comprehenison at the sentence level with 80% accuracy and min A verbal cues. SLP Short Term Goal 2 (Week 2): Pt will demonstrate recall of daily, novel information/events with supervision A verbal cues for use of memory notebook. SLP Short Term Goal 3 (Week 2): Pt will complete basic-mildly complex tasks with mod A verbal cues. SLP Short Term Goal 4 (Week 2): Pt will self-monitor and self-correct functional and verbal errors with min A verbal cues. SLP Short Term Goal 5 (Week 2): Pt will demonstrate word finding skills in higher level tasks (ex: describing, convergent/divergent naming) with min A verbal cues.  Skilled Therapeutic Interventions:   Patient seen for skilled ST session with focus on aphasia goals. Patient in bed and participated fully. She was a little frustrated that she was having BP issues which prevented her from participating in PT and OT today but otherwise in good spirits. She demonstrated ability to read at phrase level and perform commands she read (Stick out your tongue, etc) initially requiring minA verbal cues to initiate performing action after reading but progressing to performing with supervision A. She performed task of matching noun words to adjectives, requiring initially modA but improving to min-modA. She reported to SLP that she has to remind herself occasionally what the task demands/rules are as she forgets them. During semi-structured conversation regarding her children, she was having a very difficult time telling SLP the ages of her 5 children and seemed to be getting very disorganized with her thinking and language. SLP then acted as Education administrator and  patient was able to tell all ages of her kids as well as their current occupation/school major when SLP provided structure and cued for redirection. Patient continues to demonstrate good progress with language (expressive and receptive) and continues to benefit from skilled SLP intervention to maximize her cognitive-linguistic abilities prior to discharge.  Pain Pain Assessment Pain Scale: 0-10 Pain Score: 0-No pain  Therapy/Group: Individual Therapy  Sonia Baller, MA, CCC-SLP Speech Therapy

## 2020-12-17 NOTE — Progress Notes (Signed)
Occupational Therapy Note  Patient Details  Name: Felicia Chen MRN: [id number] Date of Birth: 05/11/1965  Today's Date: 12/17/2020 OT Missed Time: 80 Minutes Missed Time Reason: Other (comment) (symptomatic orthostatics)  Pt greeted semi-reclined in bed. Per discussion with nursing, pt with symptomatic orthostatic hypotension during last therapy session requiring bed in trendelenburg to achieve higher BP. Pt declines participation at this time due to BP limitations. OT will follow up this afternoon as time allows.    Daneen Schick [name] 12/17/2020, 10:53 AM

## 2020-12-17 NOTE — Progress Notes (Signed)
This nurse notified of orthostatic hypotension by OT,  111/73 laying down  97/57 with c/o dizziness and lightheadedness on standing and immediately returned to wheel chair  80/49 nurse notified and placed patient back in bed with and placed in trendelenburg positioning and  BP Increased  to 100/;62 pulse 85  patient in bed with no complaints of pain or dizziness 4 P's addressed and patient in bed with call light in reach

## 2020-12-17 NOTE — Progress Notes (Signed)
Occupational Therapy Session Note  Patient Details  Name: Felicia Chen MRN: [id number] Date of Birth: [birth year]  Today's Date: 12/17/2020 OT Individual Time: 0930-1015 OT Individual Time Calculation (min): 45 min    Short Term Goals: Week 2:  OT Short Term Goal 1 (Week 2): Pt will perform BSC/toilet transfer with Mod A of 1 OT Short Term Goal 2 (Week 2): Pt will perform RUE PROM HEP with no more than Min cues OT Short Term Goal 3 (Week 2): Pt will perform sit <> stands in prep for ADL with Min A OT Short Term Goal 4 (Week 2): Pt will perform LB dress w/ Mod A and AE PRN  Skilled Therapeutic Interventions/Progress Updates:    Pt resting in bed upon arrival with daughter present. Ted hose already donned. Pt agreeable to getting OOB and sitting in w/c Supine>sit EOB with max A. Sitting balance with CGA/min A. Sit<>stand in [place] with mod A. Pt c/o nausea/dizziness. BP sitting EOB 111/73, sitting after standing 97/57 but nausea resolved. Pt transferred to w/c with [place]. Pt symptomatic and BP 80/49. LPN Vonna Kotyk notifed and pt returned to bed. BP in trendelenburg 100/62. Pt remained in bed with LPN attending. Therapy Documentation Precautions:  Precautions Precautions: Fall Precaution Comments: R hemi, inattention. SBP parameters <160 per neuro on acute Restrictions Weight Bearing Restrictions: No Pain:  Pt denies pain this morning   Therapy/Group: Individual Therapy  Leroy Libman 12/17/2020, 3:00 PM

## 2020-12-17 NOTE — Progress Notes (Signed)
[facility] PHYSICAL MEDICINE & REHABILITATION PROGRESS NOTE  Subjective/Complaints:  Pt reports emotionally feeling somewhat better this AM- More tired from yesterday and day before- didn't do as well in therapy.  Still no voiding.     ROS:   Pt denies SOB, abd pain, CP, N/V/C/D, and vision changes   Objective: Vital Signs: Blood pressure (!) 121/106, pulse 88, temperature 98.7 F (37.1 C), temperature source Oral, resp. rate 17, height 5' 7.99" (1.727 m), weight 104.3 kg, SpO2 96 %. No results found. Recent Labs    12/15/20 0524  WBC 11.0*  HGB 15.8*  HCT 46.9*  PLT 313    Recent Labs    12/15/20 0524  NA 135  K 4.2  CL 102  CO2 27  GLUCOSE 115*  BUN 16  CREATININE 0.82  CALCIUM 9.2     Intake/Output Summary (Last 24 hours) at 12/17/2020 0901 Last data filed at 12/17/2020 0100 Gross per 24 hour  Intake 120 ml  Output 1575 ml  Net -1455 ml        Physical Exam: BP (!) 121/106 (BP Location: Left Arm)   Pulse 88   Temp 98.7 F (37.1 C) (Oral)   Resp 17   Ht 5' 7.99" (1.727 m)   Wt 104.3 kg   SpO2 96%   BMI 34.97 kg/m       General: awake, alert, appropriate, sitting up in bed; NAD HENT: conjugate gaze; oropharynx moist CV: regular rate; no JVD Pulmonary: CTA B/L; no W/R/R- good air movement GI: soft, NT, ND, (+)BS Psychiatric: appropriate; no crying today- brighter Neurological: Ox3 MAS of 1+ in R eblow; Hoffman's (+) RUE Extremities: No clubbing, cyanosis, or edema Skin: No evidence of breakdown, no evidence of rash A and O x 3   She does some difficulty differentiating right from left. Motor: RUE/RLE: 0/5 proximal distal- RUE 0/5 still- splints off.  Sensation diminished to light touch RUE/RLE   Assessment/Plan: 1. Functional deficits which require 3+ hours per day of interdisciplinary therapy in a comprehensive inpatient rehab setting. Physiatrist is providing close team supervision and 24 hour management of active medical problems  listed below. Physiatrist and rehab team continue to assess barriers to discharge/monitor patient progress toward functional and medical goals   Care Tool:  Bathing    Body parts bathed by patient: Right arm, Chest, Abdomen, Right upper leg, Left upper leg, Face   Body parts bathed by helper: Left arm, Front perineal area, Buttocks, Right lower leg, Left lower leg     Bathing assist Assist Level: Total Assistance - Patient < 25%     Upper Body Dressing/Undressing Upper body dressing   What is the patient wearing?: Pull over shirt    Upper body assist Assist Level: Maximal Assistance - Patient 25 - 49%    Lower Body Dressing/Undressing Lower body dressing      What is the patient wearing?: Pants     Lower body assist Assist for lower body dressing: Maximal Assistance - Patient 25 - 49%     Toileting Toileting    Toileting assist Assist for toileting: Minimal Assistance - Patient > 75%     Transfers Chair/bed transfer  Transfers assist     Chair/bed transfer assist level: Moderate Assistance - Patient 50 - 74%     Locomotion Ambulation   Ambulation assist   Ambulation activity did not occur: Safety/medical concerns  Assist level: Maximal Assistance - Patient 25 - 49% Assistive device:  (hall rail) Max distance: 8  Walk 10 feet activity   Assist  Walk 10 feet activity did not occur: Safety/medical concerns        Walk 50 feet activity   Assist Walk 50 feet with 2 turns activity did not occur: Safety/medical concerns         Walk 150 feet activity   Assist Walk 150 feet activity did not occur: Safety/medical concerns         Walk 10 feet on uneven surface  activity   Assist Walk 10 feet on uneven surfaces activity did not occur: Safety/medical concerns         Wheelchair     Assist Is the patient using a wheelchair?: Yes Type of Wheelchair: Manual Wheelchair activity did not occur: Safety/medical  concerns  Wheelchair assist level: Dependent - Patient 0%      Wheelchair 50 feet with 2 turns activity    Assist    Wheelchair 50 feet with 2 turns activity did not occur: Safety/medical concerns       Wheelchair 150 feet activity     Assist  Wheelchair 150 feet activity did not occur: Safety/medical concerns        Medical Problem List and Plan: 1.  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  Impaired mobility -DVT/anticoagulation:  Pharmaceutical: Continue Heparin             -antiplatelet therapy: N/A 3. Pain Management: As needed meds 4. Anxiety/depression: Continue Xanax 0.5 mg 3 times daily, BuSpar 5 mg twice daily increase to TID , hydroxyzine 25 mg 4 times daily- d/c atarax due to urinary retention  9/29- will start Celexa 20 mg daily for severe new onset depression. 9/30- feeling better this AM- have asked Neuropsych to see.               -antipsychotic agents: N/A 5. Neuropsych: This patient is not fully capable of making decisions on her own behalf. 6. Skin/Wound Care: Routine skin checks 7. Fluids/Electrolytes/Nutrition: Routine in and outs 8.  Hypertension.  Continue Norvasc 5 mg daily, Cozaar 50 mg twice daily.               Monitor with increased mobility Vitals:   12/16/20 2110 12/17/20 0636  BP: (!) 100/58 (!) 121/106  Pulse: 83 88  Resp: 17   Temp: 98.6 F (37 C) 98.7 F (37.1 C)  SpO2: 94% 96%  Running on low side reduce cozaar to 25mg    9/28- use Abd binder and add for low BP.   9/29- will start Florinef 0.1 mg daily- might also need midodrine?- If BP better in a few days, will start Flomax for urinary retention. Check orthostatics daily.   9/30- will con't Abd binder/TEDS/ACE wraps- and monitor- looks a little better this AM 100/58 9.  Hyperlipidemia: Lipitor 10.  COVID-19.  Completed course of  remdesivir.  Currently completing course of dexamethasone 11.  Obesity.  BMI 39.52.  Dietary follow-up.  Encourage weight loss  9/29- BMI 34.97- improving.  12.  Leukocytosis-likely secondary to steroids             Afebrile, no signs/symptoms of infection  9/27- no labs yesterday- will check in AM  9/28- WBC down to 11k- con't to monitor weekly.  13.  Transaminitis  ALT elevated on 9/21, continue to monitor  9/27- will recheck CMP in AM  9/29- LFTs 21/60- doing better- con't to monitor 14. Orthostatic hypotension: continue TEDs, added abdominal binder in case this is not enough. Improved  9/28- adding abd binder and will wait on Florinef for today, but will see if still limiting-  15.  Urinary retention add urecholine , now off anticholinergic agents , BP too low for flomax also with orthostasis   9/27- cannot add Flomax due to low BP- is NOT voiding at all- all in/out caths-   9/29- will wait a few days on Florinef til starting Flomax.  16. Nausea  9/27- will schedule Zofran 4 mg at 6am daily to help with nausea and monitor   9/28- doing better- con't regimen.  17. Spasticity  9/30- developing low level spasticity in RUE- educated pt and daughter on spasticity and how progresses; what it means- that can masquerade as strength at low levels, etc   I spent a total of 38 minutes on total care- >50% on coordination of care- d/w pt/daughter about spasticity and low BP.   LOS: 10 days A FACE TO FACE EVALUATION WAS PERFORMED  Cherilyn Sautter 12/17/2020, 9:01 AM

## 2020-12-17 NOTE — Progress Notes (Signed)
Physical Therapy Session Note  Patient Details  Name: Felicia Chen MRN: [id number] Date of Birth: 28-Sep-1965  Today's Date: 12/17/2020 PT Individual Time: 0804-0900 PT Individual Time Calculation (min): 56 min   Short Term Goals: Week 1:  PT Short Term Goal 1 (Week 1): Pt will initiate gait training PT Short Term Goal 1 - Progress (Week 1): Met PT Short Term Goal 2 (Week 1): Pt will perform STS with mod A of 1 PT Short Term Goal 2 - Progress (Week 1): Met PT Short Term Goal 3 (Week 1): Pt will tolerate OOB sitting between therapy sessions PT Short Term Goal 3 - Progress (Week 1): Met Week 2:  PT Short Term Goal 1 (Week 2): Pt will demonstrate STS with RW and mod A PT Short Term Goal 2 (Week 2): Pt's will perform standing mobility x 2 min without orthostatic symptoms. PT Short Term Goal 3 (Week 2): Pt will perform gait with max A x 10 ft Week 3:     Skilled Therapeutic Interventions/Progress Updates:    BP monitored throughout session w/no OH occurring, but did c/o nausea, mild dizzyness and appeared extremely anxious at times.  Session included: Sitting balance on edge of bed - supervision to min assist w/mild post tendency during dynamic task Seated scooting and stand pivot transfers to L side w/emphasis on mechanics/motor planning, max to mod assist due to poor attention, impulsivilty, pt becoming frustrated/overwhelmed.  Mult breaks and encouragement required during session due to low frustration threshold.  Discussed long term nature of CVA recovery, "rewiring", and complexity of tasks attempted today.  Pt remains discouraged.   In parallel bars: Midline orientation w/Sit to stand, standing, and stepping forward/back - emphasis on protraction of R hip and shoulder due to retraction in standing, maintaiing midline w/and w/o LUE support by tapping therapists shoulders/head focusing on maintaining midline hip/shoulder advancing to stepping.  Pt frequently appeared  overwhelmed/anxious/c/o nausea, very low frustration threshold.  BP remained stable w/mult readings taken during session Pt transported back to room. Pt left oob in wc w/alarm belt set and needs in reach, daughter at bedside.     Therapy Documentation Precautions:  Precautions Precautions: Fall Precaution Comments: R hemi, inattention. SBP parameters <160 per neuro on acute Restrictions Weight Bearing Restrictions: No     Therapy/Group: Individual Therapy Callie Fielding, [place] 12/17/2020, 12:12 PM

## 2020-12-18 MED ORDER — BETHANECHOL CHLORIDE 10 MG PO TABS
10.0000 mg | ORAL_TABLET | Freq: Four times a day (QID) | ORAL | Status: DC
  Administered 2020-12-18 – 2021-01-05 (×73): 10 mg via ORAL
  Filled 2020-12-18 (×72): qty 1

## 2020-12-18 MED ORDER — BETHANECHOL CHLORIDE 10 MG PO TABS: 10.0000 mg | ORAL_TABLET | Freq: Three times a day (TID) | ORAL | Status: DC

## 2020-12-18 NOTE — Progress Notes (Signed)
PHYSICAL MEDICINE & REHABILITATION PROGRESS NOTE  Subjective/Complaints:   Still no voiding.  Daughters at bedside  have question about dizziness and meds to increase and reduce BP   Cont to have word finding problems    ROS:   Pt denies SOB, abd pain, CP, N/V/C/D, and vision changes   Objective: Vital Signs: Blood pressure 106/72, pulse 82, temperature 98.5 F (36.9 C), temperature source Oral, resp. rate 18, height 5' 7.99" (1.727 m), weight 104.3 kg, SpO2 97 %. No results found. No results for input(s): WBC, HGB, HCT, PLT in the last 72 hours.   No results for input(s): NA, K, CL, CO2, GLUCOSE, BUN, CREATININE, CALCIUM in the last 72 hours.    Intake/Output Summary (Last 24 hours) at 12/18/2020 0813 Last data filed at 12/18/2020 0400 Gross per 24 hour  Intake --  Output 1075 ml  Net -1075 ml         Physical Exam: BP 106/72 (BP Location: Left Arm)   Pulse 82   Temp 98.5 F (36.9 C) (Oral)   Resp 18   Ht 5' 7.99" (1.727 m)   Wt 104.3 kg   SpO2 97%   BMI 34.97 kg/m   General: No acute distress Mood and affect are appropriate Heart: Regular rate and rhythm no rubs murmurs or extra sounds Lungs: Clear to auscultation, breathing unlabored, no rales or wheezes Abdomen: Positive bowel sounds, soft nontender to palpation, nondistended  Extremities: No clubbing, cyanosis, or edema Skin: No evidence of breakdown, no evidence of rash A and O x 3   Tone with clonus RIght ankle  She does some difficulty differentiating right from left. Motor: RUE/RLE: 0/5 proximal distal- RUE 0/5 still- splints off.  Sensation diminished to light touch RUE/RLE   Assessment/Plan: 1. Functional deficits which require 3+ hours per day of interdisciplinary therapy in a comprehensive inpatient rehab setting. Physiatrist is providing close team supervision and 24 hour management of active medical problems listed below. Physiatrist and rehab team continue to assess  barriers to discharge/monitor patient progress toward functional and medical goals   Care Tool:  Bathing    Body parts bathed by patient: Right arm, Chest, Abdomen, Right upper leg, Left upper leg, Face   Body parts bathed by helper: Left arm, Front perineal area, Buttocks, Right lower leg, Left lower leg     Bathing assist Assist Level: Total Assistance - Patient < 25%     Upper Body Dressing/Undressing Upper body dressing   What is the patient wearing?: Pull over shirt    Upper body assist Assist Level: Maximal Assistance - Patient 25 - 49%    Lower Body Dressing/Undressing Lower body dressing      What is the patient wearing?: Pants     Lower body assist Assist for lower body dressing: Maximal Assistance - Patient 25 - 49%     Toileting Toileting    Toileting assist Assist for toileting: Minimal Assistance - Patient > 75%     Transfers Chair/bed transfer  Transfers assist     Chair/bed transfer assist level: Moderate Assistance - Patient 50 - 74%     Locomotion Ambulation   Ambulation assist   Ambulation activity did not occur: Safety/medical concerns  Assist level: Maximal Assistance - Patient 25 - 49% Assistive device:  (hall rail) Max distance: 8   Walk 10 feet activity   Assist  Walk 10 feet activity did not occur: Safety/medical concerns        Walk 50 feet  activity   Assist Walk 50 feet with 2 turns activity did not occur: Safety/medical concerns         Walk 150 feet activity   Assist Walk 150 feet activity did not occur: Safety/medical concerns         Walk 10 feet on uneven surface  activity   Assist Walk 10 feet on uneven surfaces activity did not occur: Safety/medical concerns         Wheelchair     Assist Is the patient using a wheelchair?: Yes Type of Wheelchair: Manual Wheelchair activity did not occur: Safety/medical concerns  Wheelchair assist level: Dependent - Patient 0%      Wheelchair 50  feet with 2 turns activity    Assist    Wheelchair 50 feet with 2 turns activity did not occur: Safety/medical concerns       Wheelchair 150 feet activity     Assist  Wheelchair 150 feet activity did not occur: Safety/medical concerns        Medical Problem List and Plan: 1.  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  Impaired mobility -DVT/anticoagulation:  Pharmaceutical: Continue Heparin             -antiplatelet therapy: N/A 3. Pain Management: As needed meds 4. Anxiety/depression: Continue Xanax 0.5 mg 3 times daily, BuSpar 5 mg twice daily increase to TID , hydroxyzine 25 mg 4 times daily- d/c atarax due to urinary retention  9/29- will start Celexa 20 mg daily for severe new onset depression. 9/30- feeling better this AM- have asked Neuropsych to see.               -antipsychotic agents: N/A 5. Neuropsych: This patient is not fully capable of making decisions on her own behalf. 6. Skin/Wound Care: Routine skin checks 7. Fluids/Electrolytes/Nutrition: Routine in and outs 8.  Hypertension.  Continue Norvasc 5 mg daily, Cozaar 50 mg twice daily.               Monitor with increased mobility Vitals:   12/17/20 1955 12/18/20 0352  BP: (!) 96/51 106/72  Pulse: 79 82  Resp: 18 18  Temp: 98.7 F (37.1 C) 98.5 F (36.9 C)  SpO2: 96% 97%  Running on low side reduce cozaar to 25mg  BID on 9/26  9/28- use Abd binder and add for low BP.   9/29- will start Florinef 0.1 mg daily- might also need midodrine?- If BP better in a few days, will start Flomax for urinary retention. Check orthostatics daily.   9/30- will con't Abd binder/TEDS/ACE wraps- and monitor- looks a little better this AM 100/58 10/1- reduce cozaar to 25mg  qd, amlodipine to 2.5 mgwould reduce BP meds before starting midodrine or increasing florinef 9.   Hyperlipidemia: Lipitor 10.  COVID-19.  Completed course of remdesivir.  Currently completing course of dexamethasone 11.  Obesity.  BMI 39.52.  Dietary follow-up.  Encourage weight loss  9/29- BMI 34.97- improving.  12.  Leukocytosis-likely secondary to steroids             Afebrile, no signs/symptoms of infection  9/27- no labs yesterday- will check in AM  9/28- WBC down to 11k- con't to monitor weekly.  13.  Transaminitis  ALT elevated on 9/21, continue to monitor  9/27- will recheck CMP in AM  9/29- LFTs 21/60- doing better- con't  to monitor 14. Orthostatic hypotension: continue TEDs, added abdominal binder in case this is not enough. Improved  reduce cozaar to 25mg  daily , amlodipine to 2.5mg  15.  Urinary retention add urecholine increase to 10mg  QID , now off anticholinergic agents , BP too low for flomax also with orthostasis   9/27- cannot add Flomax due to low BP- is NOT voiding at all- all in/out caths-   9/29- will wait a few days on Florinef til starting Flomax.  16. Nausea  9/27- will schedule Zofran 4 mg at 6am daily to help with nausea and monitor   9/28- doing better- con't regimen.  17. Spasticity  9/30- developing low level spasticity in RUE- educated pt and daughter on spasticity and how progresses; what it means- that can masquerade as strength at low levels, etc   I spent a total of 38 minutes on total care- >50% on coordination of care- d/w pt and 2 daughters about spasticity and low BP, as well as urinary retention .   LOS: 11 days A FACE TO FACE EVALUATION WAS PERFORMED  Charlett Blake 12/18/2020, 8:13 AM

## 2020-12-19 MED ORDER — MIDODRINE HCL 5 MG PO TABS
5.0000 mg | ORAL_TABLET | Freq: Three times a day (TID) | ORAL | Status: DC
  Administered 2020-12-19 – 2020-12-23 (×12): 5 mg via ORAL
  Filled 2020-12-19 (×12): qty 1

## 2020-12-19 MED ORDER — TRAZODONE HCL 50 MG PO TABS
50.0000 mg | ORAL_TABLET | Freq: Every day | ORAL | Status: DC
  Administered 2020-12-19 – 2021-01-04 (×17): 50 mg via ORAL
  Filled 2020-12-19 (×16): qty 1

## 2020-12-19 NOTE — Progress Notes (Signed)
Speech Language Pathology Daily Session Note  Patient Details  Name: ESMAE DONATHAN MRN: [id number] Date of Birth: 23-Aug-1965  Today's Date: 12/19/2020 SLP Individual Time: 7588-3254 SLP Individual Time Calculation (min): 40 min  Short Term Goals: Week 2: SLP Short Term Goal 1 (Week 2): Pt will demonstrate reading comprehenison at the sentence level with 80% accuracy and min A verbal cues. SLP Short Term Goal 2 (Week 2): Pt will demonstrate recall of daily, novel information/events with supervision A verbal cues for use of memory notebook. SLP Short Term Goal 3 (Week 2): Pt will complete basic-mildly complex tasks with mod A verbal cues. SLP Short Term Goal 4 (Week 2): Pt will self-monitor and self-correct functional and verbal errors with min A verbal cues. SLP Short Term Goal 5 (Week 2): Pt will demonstrate word finding skills in higher level tasks (ex: describing, convergent/divergent naming) with min A verbal cues.  Skilled Therapeutic Interventions:Skilled ST services focused on cognitive skills. Pt's son and daughter were present. SLP facilitated basic problem solving, error awareness and recall skills in novel card task Blink. Pt required max A fade to mod A verbal cues from problem solving to apply 3 rules, pt required mod A verbal cues for error awareness and mod A fade to min A verbal cues for recall of 3 ways to match with use of visual aid/list. Pt's son asked appropriate questions pertaining to right side neglect, SLP provided education pertaining to right side neglect versus visual deficits and ways to improve right side attention. All questions answered to satisfaction. Pt was left in room with family, call bell within reach and bed alarm set. SLP recommends to continue skilled services.      Pain Pain Assessment Pain Score: 0-No pain  Therapy/Group: Individual Therapy  Nael Petrosyan  [facility] 12/19/2020, 4:20 PM

## 2020-12-19 NOTE — Progress Notes (Signed)
[facility] PHYSICAL MEDICINE & REHABILITATION PROGRESS NOTE  Subjective/Complaints:   Pt reports feels OK, but BP was 62/41 this AM- felt lighthheaded, but better= was sitting up.   Will add Midodrine 5 mg TID with meals/0630 am for first AM dose so doesn't interfere with therapy.    ROS:   Pt denies SOB, abd pain, CP, N/V/C/D, and vision changes   Objective: Vital Signs: Blood pressure (!) 62/41, pulse 81, temperature (!) 97.3 F (36.3 C), temperature source Oral, resp. rate 15, height 5' 7.99" (1.727 m), weight 104.3 kg, SpO2 94 %. No results found. No results for input(s): WBC, HGB, HCT, PLT in the last 72 hours.   No results for input(s): NA, K, CL, CO2, GLUCOSE, BUN, CREATININE, CALCIUM in the last 72 hours.    Intake/Output Summary (Last 24 hours) at 12/19/2020 1104 Last data filed at 12/19/2020 0810 Gross per 24 hour  Intake 240 ml  Output 675 ml  Net -435 ml        Physical Exam: BP (!) 62/41   Pulse 81   Temp (!) 97.3 F (36.3 C) (Oral)   Resp 15   Ht 5' 7.99" (1.727 m)   Wt 104.3 kg   SpO2 94%   BMI 34.97 kg/m    General: awake, alert, appropriate, sitting up in bed; c/o cognitive changes with low BP; daughter and son in room; NAD HENT: conjugate gaze; oropharynx moist CV: regular rate; no JVD Pulmonary: CTA B/L; no W/R/R- good air movement GI: soft, NT, ND, (+)BS Psychiatric: appropriate; more interactive Neurological: aphasic, but more at sentence level than word level anymore.   Extremities: No clubbing, cyanosis, or edema Skin: No evidence of breakdown, no evidence of rash A and O x 3   Tone with clonus RIght ankle  She does some difficulty differentiating right from left. Motor: RUE/RLE: 0/5 proximal distal- RUE 0/5 still- splints off.  Sensation diminished to light touch RUE/RLE   Assessment/Plan: 1. Functional deficits which require 3+ hours per day of interdisciplinary therapy in a comprehensive inpatient rehab setting. Physiatrist  is providing close team supervision and 24 hour management of active medical problems listed below. Physiatrist and rehab team continue to assess barriers to discharge/monitor patient progress toward functional and medical goals   Care Tool:  Bathing    Body parts bathed by patient: Right arm, Chest, Abdomen, Right upper leg, Left upper leg, Face   Body parts bathed by helper: Left arm, Front perineal area, Buttocks, Right lower leg, Left lower leg     Bathing assist Assist Level: Total Assistance - Patient < 25%     Upper Body Dressing/Undressing Upper body dressing   What is the patient wearing?: Pull over shirt    Upper body assist Assist Level: Maximal Assistance - Patient 25 - 49%    Lower Body Dressing/Undressing Lower body dressing      What is the patient wearing?: Pants     Lower body assist Assist for lower body dressing: Maximal Assistance - Patient 25 - 49%     Toileting Toileting    Toileting assist Assist for toileting: Minimal Assistance - Patient > 75%     Transfers Chair/bed transfer  Transfers assist     Chair/bed transfer assist level: Moderate Assistance - Patient 50 - 74%     Locomotion Ambulation   Ambulation assist   Ambulation activity did not occur: Safety/medical concerns  Assist level: Maximal Assistance - Patient 25 - 49% Assistive device:  (hall rail) Max  distance: 8   Walk 10 feet activity   Assist  Walk 10 feet activity did not occur: Safety/medical concerns        Walk 50 feet activity   Assist Walk 50 feet with 2 turns activity did not occur: Safety/medical concerns         Walk 150 feet activity   Assist Walk 150 feet activity did not occur: Safety/medical concerns         Walk 10 feet on uneven surface  activity   Assist Walk 10 feet on uneven surfaces activity did not occur: Safety/medical concerns         Wheelchair     Assist Is the patient using a wheelchair?: Yes Type of  Wheelchair: Manual Wheelchair activity did not occur: Safety/medical concerns  Wheelchair assist level: Dependent - Patient 0%      Wheelchair 50 feet with 2 turns activity    Assist    Wheelchair 50 feet with 2 turns activity did not occur: Safety/medical concerns       Wheelchair 150 feet activity     Assist  Wheelchair 150 feet activity did not occur: Safety/medical concerns        Medical Problem List and Plan: 1.  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  Impaired mobility -DVT/anticoagulation:  Pharmaceutical: Continue Heparin             -antiplatelet therapy: N/A 3. Pain Management: As needed meds 4. Anxiety/depression: Continue Xanax 0.5 mg 3 times daily, BuSpar 5 mg twice daily increase to TID , hydroxyzine 25 mg 4 times daily- d/c atarax due to urinary retention  9/29- will start Celexa 20 mg daily for severe new onset depression. 9/30- feeling better this AM- have asked Neuropsych to see.               -antipsychotic agents: N/A 5. Neuropsych: This patient is not fully capable of making decisions on her own behalf. 6. Skin/Wound Care: Routine skin checks 7. Fluids/Electrolytes/Nutrition: Routine in and outs 8.  Hypertension.  Continue Norvasc 5 mg daily, Cozaar 50 mg twice daily.               Monitor with increased mobility Vitals:   12/18/20 2028 12/19/20 0708  BP: 92/67 (!) 62/41  Pulse: 88 81  Resp: 15   Temp: (!) 97.3 F (36.3 C)   SpO2: 95% 94%  Running on low side reduce cozaar to 25mg  BID on 9/26  9/28- use Abd binder and add for low BP.   9/29- will start Florinef 0.1 mg daily- might also need midodrine?- If BP better in a few days, will start Flomax for urinary retention. Check orthostatics daily.   9/30- will con't Abd binder/TEDS/ACE wraps- and monitor- looks a little better this AM  100/58 10/1- reduce cozaar to 25mg  qd, amlodipine to 2.5 mgwould reduce BP meds before starting midodrine or increasing florinef  10/2- will con't fLORINEF AND ADD mIDRODRINE 5 MG tid WITH MEALS/FIRST DOSE 0630AM.  9.  Hyperlipidemia: Lipitor 10.  COVID-19.  Completed course of remdesivir.  Currently completing course of dexamethasone 11.  Obesity.  BMI 39.52.  Dietary follow-up.  Encourage weight loss  9/29- BMI 34.97- improving.  12.  Leukocytosis-likely secondary to steroids             Afebrile, no signs/symptoms of infection  9/27- no labs yesterday- will check in AM  9/28- WBC down to 11k- con't to monitor weekly.  13.  Transaminitis  ALT elevated on 9/21, continue to monitor  9/27- will recheck CMP in AM  9/29- LFTs 21/60- doing better- con't to monitor 14. Orthostatic hypotension: continue TEDs, added abdominal binder in case this is not enough. Improved  reduce cozaar to 25mg  daily , amlodipine to 2.5mg  15.  Urinary retention add urecholine increase to 10mg  QID , now off anticholinergic agents , BP too low for flomax also with orthostasis   9/27- cannot add Flomax due to low BP- is NOT voiding at all- all in/out caths-   9/29- will wait a few days on Florinef til starting Flomax.   10/2- BP still too low to start Flomax.  16. Nausea  9/27- will schedule Zofran 4 mg at 6am daily to help with nausea and monitor   9/28- doing better- con't regimen.  17. 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

## 2020-12-19 NOTE — Progress Notes (Signed)
Dr. Dagoberto Ligas aware of low blood pressure and eill be starting patient on Midodrine. Patient states she is feeling alright and denies any dizziness.

## 2020-12-19 NOTE — Progress Notes (Signed)
Occupational Therapy Session Note  Patient Details  Name: Felicia Chen MRN: [id number] Date of Birth: March 15, 1966  Today's Date: 12/19/2020 OT Individual Time: 1300-1400 OT Individual Time Calculation (min): 60 min    Short Term Goals: Week 2:  OT Short Term Goal 1 (Week 2): Pt will perform BSC/toilet transfer with Mod A of 1 OT Short Term Goal 2 (Week 2): Pt will perform RUE PROM HEP with no more than Min cues OT Short Term Goal 3 (Week 2): Pt will perform sit <> stands in prep for ADL with Min A OT Short Term Goal 4 (Week 2): Pt will perform LB dress w/ Mod A and AE PRN  Skilled Therapeutic Interventions/Progress Updates:    Treatment session with focus on postural changes, dynamic sitting balance, sit > stand, and problem solving/awareness.  Pt received semi-reclined in bed with RN administering BP meds.  Therapist donned thigh high TEDS before exiting bed.  Pt completed bed mobility with mod assist and mod cues for sequencing and problem solving.  Engaged in seated activity at EOB with focus on sitting balance, maintaining steady BP, and problem solving/awareness while completing pattern replication with foam blocks. Pt required mod cues for problem solving, sequencing, and awareness of errors during block building activity.  Therapist educating on typical recovery pattern and implications with low BP.  Pt able to engage in reaching activity in sitting with CGA- Supervision for sitting balance.  Engaged in sit > stand x3 with +2 assist for safety.  Pt able to complete sit > stand to RW with mod A +2 for safety.  Pt maintained standing ~10 seconds up to ~25 seconds for final stand before reporting increased nausea and lightheadedness.  Unable to obtain vitals in standing, pt BP 102/57 in sitting with no symptoms.  Pt reports feeling accomplished with increased standing tolerance and without use of Stedy.  Pt returned to semi-reclined and left with all needs in reach.  Therapy  Documentation Precautions:  Precautions Precautions: Fall Precaution Comments: R hemi, inattention. SBP parameters <160 per neuro on acute Restrictions Weight Bearing Restrictions: Yes General:   Vital Signs: Therapy Vitals Temp: 98.4 F (36.9 C) Pulse Rate: 81 Resp: 18 BP: (!) 82/56 Patient Position (if appropriate): Lying Oxygen Therapy SpO2: 94 % O2 Device: Room Air Pain:  Pt with no c/o pain   Therapy/Group: Individual Therapy  Simonne Come 12/19/2020, 2:59 PM

## 2020-12-20 DIAGNOSIS — I610 Nontraumatic intracerebral hemorrhage in hemisphere, subcortical: Secondary | ICD-10-CM

## 2020-12-20 MED ORDER — AMLODIPINE BESYLATE 2.5 MG PO TABS
2.5000 mg | ORAL_TABLET | Freq: Every day | ORAL | Status: DC
  Filled 2020-12-20: qty 1

## 2020-12-20 NOTE — Progress Notes (Signed)
Speech Language Pathology Daily Session Note  Patient Details  Name: Felicia Chen MRN: [id number] Date of Birth: Apr 09, 1965  Today's Date: 12/20/2020 SLP Individual Time: 1030-1100 SLP Individual Time Calculation (min): 30 min  Short Term Goals: Week 2: SLP Short Term Goal 1 (Week 2): Pt will demonstrate reading comprehenison at the sentence level with 80% accuracy and min A verbal cues. SLP Short Term Goal 2 (Week 2): Pt will demonstrate recall of daily, novel information/events with supervision A verbal cues for use of memory notebook. SLP Short Term Goal 3 (Week 2): Pt will complete basic-mildly complex tasks with mod A verbal cues. SLP Short Term Goal 4 (Week 2): Pt will self-monitor and self-correct functional and verbal errors with min A verbal cues. SLP Short Term Goal 5 (Week 2): Pt will demonstrate word finding skills in higher level tasks (ex: describing, convergent/divergent naming) with min A verbal cues.  Skilled Therapeutic Interventions: Pt received semi-reclined in bed and agreeable to skilled ST intervention with focus on cognitive-communication goals. SLP facilitated session by providing mod A verbal cues for alternating attention and generative naming task involving naming words within a specific category based on initial letters. Pt benefited from semantic cue for generative naming. Pt displayed difficulty alternating her attention between the topic and the letter and also required increased verbal reinforcement and for recall of task directions following external distractions to staff and family coming in and out of room. SLP provided education on attention strategies and discussed cognitive fatigue and how intermittent breaks can be beneficial for cognitive performance. Patient was left in bed with alarm activated and immediate needs within reach at end of session. Continue per current plan of care.    Pain Pain Assessment Pain Scale: 0-10 Pain Score: 0-No  pain  Therapy/Group: Individual Therapy  Felicia Chen 12/20/2020, 12:56 PM

## 2020-12-20 NOTE — Progress Notes (Signed)
Speech Language Pathology Weekly Progress and Session Note  Patient Details  Name: Felicia Chen MRN: [id number] Date of Birth: 1965/12/13  Beginning of progress report period: December 13, 2020 End of progress report period: December 20, 2020  Today's Date: 12/20/2020 SLP Individual Time: 1303-1405 SLP Individual Time Calculation (min): 62 min  Short Term Goals: Week 2: SLP Short Term Goal 1 (Week 2): Pt will demonstrate reading comprehenison at the sentence level with 80% accuracy and min A verbal cues. SLP Short Term Goal 1 - Progress (Week 2): Met SLP Short Term Goal 2 (Week 2): Pt will demonstrate recall of daily, novel information/events with supervision A verbal cues for use of memory notebook. SLP Short Term Goal 2 - Progress (Week 2): Met SLP Short Term Goal 3 (Week 2): Pt will complete basic-mildly complex tasks with mod A verbal cues. SLP Short Term Goal 3 - Progress (Week 2): Met SLP Short Term Goal 4 (Week 2): Pt will self-monitor and self-correct functional and verbal errors with min A verbal cues. SLP Short Term Goal 4 - Progress (Week 2): Not met SLP Short Term Goal 5 (Week 2): Pt will demonstrate word finding skills in higher level tasks (ex: describing, convergent/divergent naming) with min A verbal cues. SLP Short Term Goal 5 - Progress (Week 2): Not met SLP Short Term Goal 6 (Week 2): Pt will demonstrate recall of daily, novel information/events with supervision A verbal cues for use of memory notebook.    New Short Term Goals: Week 3: SLP Short Term Goal 1 (Week 3): Pt will demonstrate reading comprehenison at the sentence level with 80% accuracy and supervision A verbal cues. SLP Short Term Goal 2 (Week 3): Pt will complete mildly complex tasks with mod A verbal cues. SLP Short Term Goal 3 (Week 3): Pt will self-monitor and self-correct functional and functional/verbal errors with min A verbal cues. SLP Short Term Goal 4 (Week 3): Pt will demonstrate word  finding skills in higher level tasks (ex: describing, convergent/divergent naming) with min A verbal cues. SLP Short Term Goal 5 (Week 3): Pt will demonstrate recall of daily, novel information/events with supervision A verbal cues for compensatory strategies (written aids, memory notebooks).  Weekly Progress Updates: Pt made moderate progress meeting 3 out 6 goals this reporting period. Pt demonstrates improvement in expressing wants/needs at sentence/conversation level, carryover of word finding strategies, awareness of errors, and recall of novel information with memory notebook. Pt had demonstrated increased reading comprehension at phrase and sentence level, further impacted by old glasses prescription.  Pt barriers to discharge include, disorganized thought processes, higher level word finding, selective attention due to internal distractions, correct errors, short term recall and mildly complex problem solving skills. Pt would benefit from skilled ST services in order to maximize functional independence and reduce burden of care, requiring supervision at discharge with continued skilled ST services.      Intensity: Minumum of 1-2 x/day, 30 to 90 minutes Frequency: 3 to 5 out of 7 days Duration/Length of Stay: 10/17 Treatment/Interventions: Cognitive remediation/compensation;Cueing hierarchy;Functional tasks;Speech/Language facilitation;Internal/external aids;Patient/family education   Daily Session  Skilled Therapeutic Interventions: Skilled ST services focused on language skills. SLP recorded daily events in memory notebook, pt required mod A fading to min A verbal cues to recall am events and supervision A verbal cues to use notebook to recall yesterday's therapy events. SLP facilitated familiar task of generative naming, pt wanted to assess carryover from am session. Pt required mainly mod A verbal cues for divergent/generative naming  with carryover at the end of the task for self-cuing  only requiring min A semantic cues. SLP provided education pertaining to current goal status, process of neurological rehab and goals for upcoming reporting period. Pt and pt's husband asked appropriate questioned, all answered to satisfaction. Pt was left in room with pt's husband, call bell within reach and chair alarm set. SLP recommends to continue skilled services.    General    Pain Pain Assessment Pain Scale: 0-10 Pain Score: 0-No pain  Therapy/Group: Individual Therapy  [name]  [facility] 12/20/2020, 2:25 PM

## 2020-12-20 NOTE — Consult Note (Signed)
Neuropsychological Consultation   Patient:   Felicia Chen   DOB:   07-11-1965  MR Number:  [id number]  Location:  [facility] [address] [facility] [address] [place] [id number] [place] [id number] Dept: [place]: [phone]           Date of Service:   12/20/2020  Start Time:   8 AM End Time:   9 AM  Provider/Observer:  Ilean Skill, Psy.D.       Clinical Neuropsychologist       Billing Code/Service: 67893  Chief Complaint:    Felicia Chen is a 55 year old female who has a past medical history including hyperlipidemia, anxiety, Gilbert's syndrome endometriosis, obesity and recent COVID-19 illness.  Patient presented on 12/01/2020 with increasing headache, right-sided weakness and neglect.  CT angiogram head neck showed 60 cm right frontal parietal lobar hematoma with regional subarachnoid extension.  MRI showed overall stable size of large left parietal intraparenchymal hematoma with subarachnoid hemorrhage overlying the bilateral cerebral hemispheres and cerebellum.  Patient did complete course of remdesivir and wean from steroids.  Patient with residual right-sided hemiparesis with sensory deficit and was admitted to the comprehensive rehabilitation program.  Patient with ongoing depression and anxiety and difficulty coping with residual cognitive and physical/motor changes.  Reason for Service:  Patient was referred for neuropsychological consultation due to coping and adjustment with the development of anxiety and stress during recovery and treatment during CIR.  Below is the HPI for the current admission.  HPI: Felicia Chen is a 55 year old right-handed female with history of hyperlipidemia, anxiety, Gilbert's syndrome endometriosis status post TAH/BSO 2014, obesity with BMI 39.5 and recent COVID-19.  History taken from patient and chart review due to cognition patient lives with spouse.  1 level  home 2 steps to entry.  Independent prior to admission provides assistance for her 10 year old mother.  She presented on 12/01/2020 with increasing headache, right side weakness and neglect.  CT angiogram head and neck showed 60 cc left frontal parietal lobar hematoma with regional subarachnoid extension.  No spot sign or discrete vascular lesion underlying the lobar hematoma.  Mild atherosclerosis in the neck.  CT venogram of the head negative for dural venous sinus thrombosis.  MRI showed overall stable size of large left parietal intraparenchymal hematoma with subarachnoid hemorrhage overlying the bilateral cerebral hemispheres and cerebellum.  No evidence of intraventricular extension or hemorrhage.  EEG negative for seizures.  Echocardiogram with ejection fraction of 60-65%, no wall motion abnormality grade 1 diastolic dysfunction.  Patient did experience hypoxic respiratory failure on admission and monitored by critical care medicine.  Admission chemistries unremarkable except potassium 3.4, glucose 130, alcohol negative, urine drug screen negative, hemoglobin 15.2.  Hypertonic saline initiated.  Placed on Cleviprex for blood pressure control.  Neurology follow-up with conservative care.  Consideration made to repeat CTA in 2 weeks to reevaluate cerebral vasoconstriction versus atherosclerotic stenosis.  Hospital course patient did have a fall while using bedside commode 12/05/2020 sustaining right facial and eyelid bruising with swelling and cranial CT scan showed no progression of the left cerebral hematoma with mild subarachnoid extension.  No hydrocephalus or midline shift.  She was cleared to begin subcutaneous heparin for DVT prophylaxis.  In regards to patient's [id number] she did complete course of Remdesivir and weaned from dexamethasone.  Therapy evaluations completed due to patient's right-sided hemiparesis with sensory deficits was admitted for a comprehensive rehab program.  Please see preadmission  assessment from  earlier today as well.  Current Status:  Patient was awake and alert as I entered the room with her daughter present.  Patient acknowledged difficulty around anxiety and stress associated with her recovery.  However, she reports that she has improved over the past couple of days with adjustments made to blood pressure as she had been running low blood pressure.  The patient continues to have motor deficits and difficulties with cognition particularly around parietal lobe associated functions.  Neglect symptoms and other coping issues present.  Patient with good expressive language.  Patient had question as to etiological factors and wondering whether her COVID-19 illness caused the cerebrovascular issues.  At this point, an explanation of the exact causes or factors would be difficult to specifically identify.  Patient reports that her depression and anxiety have been significantly improving over the past couple of days and she is looking forward to working hard in getting maximal recovery and rehab.  Behavioral Observation: Felicia Chen  presents as a 55 y.o.-year-old Right handed Caucasian Female who appeared her stated age. her dress was Appropriate and she was Well Groomed and her manners were Appropriate to the situation.  her participation was indicative of Appropriate and Redirectable behaviors.  There were physical disabilities noted.  she displayed an appropriate level of cooperation and motivation.     Interactions:    Active Appropriate  Attention:   abnormal and attention span appeared shorter than expected for age  Memory:   abnormal; remote memory intact, recent memory impaired  Visuo-spatial:  abnormal  Speech (Volume):  normal  Speech:   normal; normal  Thought Process:  Coherent and Relevant  Though Content:  WNL; not suicidal and not homicidal  Orientation:   person, place, time/date, and  situation  Judgment:   Fair  Planning:   Poor  Affect:    Anxious  Mood:    Dysphoric  Insight:   Good  Intelligence:   normal  Medical History:   Past Medical History:  Diagnosis Date   Anxiety    Gilbert's syndrome          Patient Active Problem List   Diagnosis Date Noted   Transaminitis    Right hemiplegia ([place])    Hemorrhagic stroke ([place])    Leucocytosis    Essential hypertension    Dyslipidemia    Right hemiparesis ([place])    [place] (intracerebral hemorrhage) ([place]) 12/01/2020   HYPERLIPIDEMIA 07/06/2009   OBESITY 07/06/2009   Psychiatric History:  Patient does have a history of some anxiety and difficulty coping at various times but denies any significant psychiatric illness.  Family Med/Psych History: History reviewed. No pertinent family history.   Impression/DX:  Felicia Chen is a 55 year old female who has a past medical history including hyperlipidemia, anxiety, Gilbert's syndrome endometriosis, obesity and recent COVID-19 illness.  Patient presented on 12/01/2020 with increasing headache, right-sided weakness and neglect.  CT angiogram head neck showed 60 cm right frontal parietal lobar hematoma with regional subarachnoid extension.  MRI showed overall stable size of large left parietal intraparenchymal hematoma with subarachnoid hemorrhage overlying the bilateral cerebral hemispheres and cerebellum.  Patient did complete course of remdesivir and wean from steroids.  Patient with residual right-sided hemiparesis with sensory deficit and was admitted to the comprehensive rehabilitation program.  Patient with ongoing depression and anxiety and difficulty coping with residual cognitive and physical/motor changes.  Patient was awake and alert as I entered the room with her daughter present.  Patient acknowledged difficulty around  anxiety and stress associated with her recovery.  However, she reports that she has improved over the past couple of days with  adjustments made to blood pressure as she had been running low blood pressure.  The patient continues to have motor deficits and difficulties with cognition particularly around parietal lobe associated functions.  Neglect symptoms and other coping issues present.  Patient with good expressive language.  Patient had question as to etiological factors and wondering whether her COVID-19 illness caused the cerebrovascular issues.  At this point, an explanation of the exact causes or factors would be difficult to specifically identify.  Patient reports that her depression and anxiety have been significantly improving over the past couple of days and she is looking forward to working hard in getting maximal recovery and rehab.  Disposition/Plan:  Today we worked on coping and adjustment issues and helping the patient understand the process of a rehabilitative efforts and goals for her return home.  The patient reports that she is noticing some significant improvements and with time patient is expected to continue to make gains from her cerebrovascular hemorrhage.  Diagnosis:    Nontraumatic subcortical hemorrhage of left cerebral hemisphere [facility]) - Plan: Ambulatory referral to Neurology         Electronically Signed   _______________________ Ilean Skill, Psy.D. Clinical Neuropsychologist

## 2020-12-20 NOTE — Progress Notes (Signed)
Occupational Therapy Session Note  Patient Details  Name: Felicia Chen MRN: [id number] Date of Birth: [birth year]  Today's Date: 12/20/2020 OT Individual Time: 1125-1205 OT Individual Time Calculation (min): 40 min  and Today's Date: 12/20/2020 OT Missed Time: 20 Minutes Missed Time Reason: Nursing care (cathing)   Short Term Goals: Week 1:  OT Short Term Goal 1 (Week 1): Pt will perform sit <> stand at LRAD with MOD of 1 OT Short Term Goal 1 - Progress (Week 1): Met OT Short Term Goal 2 (Week 1): Pt will thread BLEs into pants with AE PRN OT Short Term Goal 2 - Progress (Week 1): Progressing toward goal OT Short Term Goal 3 (Week 1): Pt will perform BSC/toilet transfer with MOD of 1 OT Short Term Goal 3 - Progress (Week 1): Progressing toward goal OT Short Term Goal 4 (Week 1): Pt will perform self ROM for RUE with no more than Min cues OT Short Term Goal 4 - Progress (Week 1): Progressing toward goal Week 2:  OT Short Term Goal 1 (Week 2): Pt will perform BSC/toilet transfer with Mod A of 1 OT Short Term Goal 2 (Week 2): Pt will perform RUE PROM HEP with no more than Min cues OT Short Term Goal 3 (Week 2): Pt will perform sit <> stands in prep for ADL with Min A OT Short Term Goal 4 (Week 2): Pt will perform LB dress w/ Mod A and AE PRN   Skilled Therapeutic Interventions/Progress Updates:    Pt greeted at time of session finishing up with SLP, hand off to OT. NT present at this time as well stating pt needed cath. Pt missed 20 mins of OT at beginning of session for cathing. While cathing, OT retrieved walker hand splint for R hand and adhered to RW for future sessions to improve sit <> stands at RW. Upon resuming session, donned TEDS total A for time, supine > sit Max A from flat bed using bed rails and squat pivot bed > chair with Mod A and second helper spotting for safety. Up in chair, daughter present and had questions regarding CLOF. Planning for shower in upcoming sessions.  Discussed this with daughter and pt, both agreeable. Set up in TIS reclined with RUE elevated and pt with no symptoms of dizziness/nausea. Alarm on call bell in reach.   Therapy Documentation Precautions:  Precautions Precautions: Fall Precaution Comments: R hemi, inattention. SBP parameters <160 per neuro on acute Restrictions Weight Bearing Restrictions: Yes     Therapy/Group: Individual Therapy  [name] 12/20/2020, 7:24 AM

## 2020-12-20 NOTE — Progress Notes (Signed)
   12/19/20 0708  Assess: MEWS Score  BP (!) 62/41  Pulse Rate 81  SpO2 94 %  Assess: MEWS Score  MEWS Temp 0  MEWS Systolic 3  MEWS Pulse 0  MEWS RR 0  MEWS LOC 0  MEWS Score 3  MEWS Score Color Yellow  Assess: if the MEWS score is Yellow or Red  Were vital signs taken at a resting state? Yes  Focused Assessment No change from prior assessment  Treat  MEWS Interventions Administered scheduled meds/treatments;Escalated (See documentation below)  Pain Scale 0-10  Pain Score 0  Faces Pain Scale 0  Take Vital Signs  Increase Vital Sign Frequency  Yellow: Q 2hr X 2 then Q 4hr X 2, if remains yellow, continue Q 4hrs  Escalate  MEWS: Escalate Yellow: discuss with charge nurse/RN and consider discussing with provider and RRT  Notify: Charge Nurse/RN  Name of Charge Nurse/RN Notified maureen, RN  Date Charge Nurse/RN Notified 12/19/20  Time Charge Nurse/RN Notified 0710  Notify: Provider  Provider Name/Title Lovron, MD  Date Provider Notified 12/19/20  Time Provider Notified [phone]  Notification Type Face-to-face  Notification Reason Critical result  Provider response At bedside;See new orders  Date of Provider Response 12/19/20  Time of Provider Response 0710  Document  Patient Outcome Other (Comment) (stable at this time, reported to oncoming shift, provider)

## 2020-12-20 NOTE — Progress Notes (Signed)
[facility] PHYSICAL MEDICINE & REHABILITATION PROGRESS NOTE  Subjective/Complaints:  Pt reports feeling MUCH better today- cognitively and mood wise- thinks higher BP has really helped- won at [employer] last night.   Stayed up til 10pm last night- which is new- usually drowsing.     ROS:   Pt denies SOB, abd pain, CP, N/V/C/D, and vision changes   Objective: Vital Signs: Blood pressure 101/64, pulse 75, temperature 98.2 F (36.8 C), resp. rate 14, height 5' 7.99" (1.727 m), weight 104.3 kg, SpO2 99 %. No results found. No results for input(s): WBC, HGB, HCT, PLT in the last 72 hours.   No results for input(s): NA, K, CL, CO2, GLUCOSE, BUN, CREATININE, CALCIUM in the last 72 hours.    Intake/Output Summary (Last 24 hours) at 12/20/2020 1015 Last data filed at 12/19/2020 1600 Gross per 24 hour  Intake 236 ml  Output 500 ml  Net -264 ml        Physical Exam: BP 101/64   Pulse 75   Temp 98.2 F (36.8 C)   Resp 14   Ht 5' 7.99" (1.727 m)   Wt 104.3 kg   SpO2 99%   BMI 34.97 kg/m     General: awake, alert, appropriate, sitting up in bed; more energy; brighter; NAD HENT: conjugate gaze; oropharynx moist CV: regular rate; no JVD Pulmonary: CTA B/L; no W/R/R- good air movement GI: soft, NT, ND, (+)BS Psychiatric: appropriate- more interactive Neurological: alert- speech much better with aphasia.  Hoffman's RUE and few beats clonus RLE  Extremities: No clubbing, cyanosis, or edema Skin: No evidence of breakdown, no evidence of rash A and O x 3   Tone with clonus RIght ankle  She does some difficulty differentiating right from left. Motor: RUE/RLE: 0/5 proximal distal- RUE 0/5 still- splints off.  Sensation diminished to light touch RUE/RLE   Assessment/Plan: 1. Functional deficits which require 3+ hours per day of interdisciplinary therapy in a comprehensive inpatient rehab setting. Physiatrist is providing close team supervision and 24 hour management of  active medical problems listed below. Physiatrist and rehab team continue to assess barriers to discharge/monitor patient progress toward functional and medical goals   Care Tool:  Bathing    Body parts bathed by patient: Right arm, Chest, Abdomen, Right upper leg, Left upper leg, Face   Body parts bathed by helper: Left arm, Front perineal area, Buttocks, Right lower leg, Left lower leg     Bathing assist Assist Level: Total Assistance - Patient < 25%     Upper Body Dressing/Undressing Upper body dressing   What is the patient wearing?: Pull over shirt    Upper body assist Assist Level: Maximal Assistance - Patient 25 - 49%    Lower Body Dressing/Undressing Lower body dressing      What is the patient wearing?: Pants     Lower body assist Assist for lower body dressing: Maximal Assistance - Patient 25 - 49%     Toileting Toileting    Toileting assist Assist for toileting: Minimal Assistance - Patient > 75%     Transfers Chair/bed transfer  Transfers assist     Chair/bed transfer assist level: Moderate Assistance - Patient 50 - 74%     Locomotion Ambulation   Ambulation assist   Ambulation activity did not occur: Safety/medical concerns  Assist level: Maximal Assistance - Patient 25 - 49% Assistive device:  (hall rail) Max distance: 8   Walk 10 feet activity   Assist  Walk 10 feet  activity did not occur: Safety/medical concerns        Walk 50 feet activity   Assist Walk 50 feet with 2 turns activity did not occur: Safety/medical concerns         Walk 150 feet activity   Assist Walk 150 feet activity did not occur: Safety/medical concerns         Walk 10 feet on uneven surface  activity   Assist Walk 10 feet on uneven surfaces activity did not occur: Safety/medical concerns         Wheelchair     Assist Is the patient using a wheelchair?: Yes Type of Wheelchair: Manual Wheelchair activity did not occur:  Safety/medical concerns  Wheelchair assist level: Dependent - Patient 0%      Wheelchair 50 feet with 2 turns activity    Assist    Wheelchair 50 feet with 2 turns activity did not occur: Safety/medical concerns       Wheelchair 150 feet activity     Assist  Wheelchair 150 feet activity did not occur: Safety/medical concerns        Medical Problem List and Plan: 1.  Right side hemiplegia secondary to left frontal lobe ICH and small SAH etiology hypertension versus small vessel event  WHO/PRAFO ordered  D/c 10/19-   Continue CIR- PT, OT and SLP- BP much better per pt.  2.  Impaired mobility -DVT/anticoagulation:  Pharmaceutical: Continue Heparin             -antiplatelet therapy: N/A 3. Pain Management: As needed meds 4. Anxiety/depression: Continue Xanax 0.5 mg 3 times daily, BuSpar 5 mg twice daily increase to TID , hydroxyzine 25 mg 4 times daily- d/c atarax due to urinary retention  9/29- will start Celexa 20 mg daily for severe new onset depression. 10/3- feels mood is doing better- con't regimen              -antipsychotic agents: N/A 5. Neuropsych: This patient is not fully capable of making decisions on her own behalf. 6. Skin/Wound Care: Routine skin checks 7. Fluids/Electrolytes/Nutrition: Routine in and outs 8.  Hypertension.  Continue Norvasc 5 mg daily, Cozaar 50 mg twice daily.               Monitor with increased mobility Vitals:   12/20/20 0450 12/20/20 0759  BP: 114/70 101/64  Pulse: 73 75  Resp: 14   Temp: 98.2 F (36.8 C)   SpO2: 99%   Running on low side reduce cozaar to 25mg  BID on 9/26  9/28- use Abd binder and add for low BP.   9/29- will start Florinef 0.1 mg daily- might also need midodrine?- If BP better in a few days, will start Flomax for urinary retention. Check orthostatics daily.   9/30- will con't Abd binder/TEDS/ACE wraps- and monitor- looks a little better this AM 100/58 10/1- reduce cozaar to 25mg  qd, amlodipine to 2.5  mgwould reduce BP meds before starting midodrine or increasing florinef  10/2- will con't fLORINEF AND ADD mIDRODRINE 5 MG tid WITH MEALS/FIRST DOSE [id number].   10/3- started Midodrine since BP was so low yesterday- 60s/40s- will reduce Norvasc to 2.5 mg daily.  Con't Losartan 25 mg BID for now.  9.  Hyperlipidemia: Lipitor 10.  COVID-19.  Completed course of remdesivir.  Currently completing course of dexamethasone 11.  Obesity.  BMI 39.52.  Dietary follow-up.  Encourage weight loss  9/29- BMI 34.97- improving.  12.  Leukocytosis-likely secondary to steroids  Afebrile, no signs/symptoms of infection  9/27- no labs yesterday- will check in AM  9/28- WBC down to 11k- con't to monitor weekly.  13.  Transaminitis  ALT elevated on 9/21, continue to monitor  9/27- will recheck CMP in AM  9/29- LFTs 21/60- doing better- con't to monitor  10/3- will recheck labs in AM 14. Orthostatic hypotension: continue TEDs, added abdominal binder in case this is not enough. Improved  reduce cozaar to 25mg  daily , amlodipine to 2.5mg  15.  Urinary retention add urecholine increase to 10mg  QID , now off anticholinergic agents , BP too low for flomax also with orthostasis   9/27- cannot add Flomax due to low BP- is NOT voiding at all- all in/out caths-   9/29- will wait a few days on Florinef til starting Flomax.   10/3- wait til BP in 301S systolic til add Flomax.   16. Nausea  9/27- will schedule Zofran 4 mg at 6am daily to help with nausea and monitor   9/28- doing better- con't regimen.  17. 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    LOS: 13 days A FACE TO FACE EVALUATION WAS PERFORMED  Ninamarie Keel 12/20/2020, 10:15 AM

## 2020-12-20 NOTE — Progress Notes (Signed)
Physical Therapy Session Note  Patient Details  Name: Felicia Chen MRN: [id number] Date of Birth: 04-30-1965  Today's Date: 12/20/2020 PT Individual Time: 1415-1530 PT Individual Time Calculation (min): 75 min   Short Term Goals: Week 1:  PT Short Term Goal 1 (Week 1): Pt will initiate gait training PT Short Term Goal 1 - Progress (Week 1): Met PT Short Term Goal 2 (Week 1): Pt will perform STS with mod A of 1 PT Short Term Goal 2 - Progress (Week 1): Met PT Short Term Goal 3 (Week 1): Pt will tolerate OOB sitting between therapy sessions PT Short Term Goal 3 - Progress (Week 1): Met Week 2:  PT Short Term Goal 1 (Week 2): Pt will demonstrate STS with RW and mod A PT Short Term Goal 2 (Week 2): Pt's will perform standing mobility x 2 min without orthostatic symptoms. PT Short Term Goal 3 (Week 2): Pt will perform gait with max A x 10 ft  Skilled Therapeutic Interventions/Progress Updates:    Pt seated in TIS on arrival, slightly tilted. Pt reports greatly improved nausea from previous sessions, no c/o pain. BP taken at rest, 97/65. Binder applied before continuing session. Session focused on gait with hallway rail, 5 x 8-10 ft with max A for RLE management. DF assist wrap applied for improved control of the lower leg. Pt demoed improved affect and arousal, with no nausea or orthostatic symptoms for >45 min of session. Pt then demoed decr arousal and reported incr nausea. Pt reported this might be d/t needing to have a bowel movement. On returning to room, pt stated she had to vomit and had small emesis in trash can.  Bp noted at 85/60. Pt tilted in chair with legs elevated and conversed for several minutes until symptoms subsided. Continued with strategies to raise BP while returning to upright over several minutes in an attempt to get pt to bathroom. Upon coming fully upright, pt continued to have symptoms so returned to bed.  Squat pivot transfer mod A x 2 to return to bed, min A to  sit>supine. Pt required mod A for rolling to get set up on bedpan. Pt remained on bed pan at end of session with call bell in hand and nsg notified.   Therapy Documentation Precautions:  Precautions Precautions: Fall Precaution Comments: R hemi, inattention. SBP parameters <160 per neuro on acute Restrictions Weight Bearing Restrictions: Yes    Therapy/Group: Individual Therapy  Mickel Fuchs 12/20/2020, 3:42 PM

## 2020-12-21 LAB — COMPREHENSIVE METABOLIC PANEL
ALT: 30 U/L (ref 0–44)
AST: 15 U/L (ref 15–41)
Albumin: 3.1 g/dL — ABNORMAL LOW (ref 3.5–5.0)
Alkaline Phosphatase: 60 U/L (ref 38–126)
Anion gap: 8 (ref 5–15)
BUN: 15 mg/dL (ref 6–20)
CO2: 25 mmol/L (ref 22–32)
Calcium: 9.2 mg/dL (ref 8.9–10.3)
Chloride: 103 mmol/L (ref 98–111)
Creatinine, Ser: 0.73 mg/dL (ref 0.44–1.00)
GFR, Estimated: >60 mL/min (ref >60)
Glucose, Bld: 101 mg/dL — ABNORMAL HIGH (ref 70–99)
Potassium: 3.8 mmol/L (ref 3.5–5.1)
Sodium: 136 mmol/L (ref 135–145)
Total Bilirubin: 1.7 mg/dL — ABNORMAL HIGH (ref 0.3–1.2)
Total Protein: 6 g/dL — ABNORMAL LOW (ref 6.5–8.1)

## 2020-12-21 LAB — CBC WITH DIFFERENTIAL/PLATELET
Abs Immature Granulocytes: 0.02 10*3/uL (ref 0.00–0.07)
Basophils Absolute: 0.1 10*3/uL (ref 0.0–0.1)
Basophils Relative: 1 %
Eosinophils Absolute: 0.3 10*3/uL (ref 0.0–0.5)
Eosinophils Relative: 4 %
HCT: 40.4 % (ref 36.0–46.0)
Hemoglobin: 13.7 g/dL (ref 12.0–15.0)
Immature Granulocytes: 0 %
Lymphocytes Relative: 29 %
Lymphs Abs: 2.2 10*3/uL (ref 0.7–4.0)
MCH: 30.8 pg (ref 26.0–34.0)
MCHC: 33.9 g/dL (ref 30.0–36.0)
MCV: 90.8 fL (ref 80.0–100.0)
Monocytes Absolute: 0.6 10*3/uL (ref 0.1–1.0)
Monocytes Relative: 8 %
Neutro Abs: 4.3 10*3/uL (ref 1.7–7.7)
Neutrophils Relative %: 58 %
Platelets: 258 10*3/uL (ref 150–400)
RBC: 4.45 MIL/uL (ref 3.87–5.11)
RDW: 12 % (ref 11.5–15.5)
WBC: 7.4 10*3/uL (ref 4.0–10.5)
nRBC: 0 % (ref 0.0–0.2)

## 2020-12-21 LAB — GLUCOSE, CAPILLARY: Glucose-Capillary: 111 mg/dL — ABNORMAL HIGH (ref 70–99)

## 2020-12-21 MED ORDER — FLUDROCORTISONE ACETATE 0.1 MG PO TABS
0.1000 mg | ORAL_TABLET | Freq: Once | ORAL | Status: AC
  Administered 2020-12-21: 0.1 mg via ORAL
  Filled 2020-12-21: qty 1

## 2020-12-21 MED ORDER — FLUDROCORTISONE ACETATE 0.1 MG PO TABS
0.2000 mg | ORAL_TABLET | Freq: Every day | ORAL | Status: DC
  Administered 2020-12-22 – 2020-12-28 (×7): 0.2 mg via ORAL
  Filled 2020-12-21 (×7): qty 2

## 2020-12-21 NOTE — Progress Notes (Signed)
Patient ID: Felicia Chen, female   DOB: 1965/11/04, 55 y.o.   MRN: [id number] Met with pt, daughter and husband to discuss progress this week, BP issues and have loss days due to medical issues. Discussed next week extending her discharge date. Both daughter and pt's husband are here daily to see her in therapies. Pt is optimistic and knows this is the place for her to be she needs the rehab to make progress in therapies. Hopefully pt's BP will continue to get better and be able to be managed-MD working on this daily. Will continue to work on discharge needs.

## 2020-12-21 NOTE — Patient Care Conference (Signed)
Inpatient RehabilitationTeam Conference and Plan of Care Update Date: 12/21/2020   Time: 11:29 AM    Patient Name: Felicia Chen      Medical Record Number: [id number]  Date of Birth: 15-Oct-1965 Sex: Female         Room/Bed: 4M13C/4M13C-01 Payor Info: Payor: Theme park manager / Plan: [place] / Product Type: *No Product type* /    Admit Date/Time:  12/07/2020  5:42 PM  Primary Diagnosis:  ICH (intracerebral hemorrhage) [facility])  Hospital Problems: Principal Problem:   ICH (intracerebral hemorrhage) ([place]) Active Problems:   Transaminitis   Right hemiplegia [facility])    Expected Discharge Date: Expected Discharge Date: 01/05/21  Team Members Present: Physician leading conference: Dr. Courtney Heys Social Worker Present: Ovidio Kin, LCSW Nurse Present: Dorthula Nettles, RN PT Present: Ailene Rud, PT OT Present: Lillia Corporal, OT SLP Present: Charolett Bumpers, SLP PPS Coordinator present : Gunnar Fusi, SLP     Current Status/Progress Goal Weekly Team Focus  Bowel/Bladder   Pt is currently incont of bowel and unable to void. Pt requires q8h I&O cath  Pt gain cont of B/B  Encouage pt to void and timed toileting   Swallow/Nutrition/ Hydration             ADL's   Mod squat pivot to L, declines toileting and bathing tasks politely, Max A bed level dressing, vestibular issues much improved, BP fluctuates (sometimes low, sometimes dizzy and when sitting high), cognition limitng, poor sequencing  CGA transfers, Min LB  standing balance, sit > stands at [place], toileting!!!, toilet transfers, RUE NMR, motor planning and sequencing   Mobility   bed mobility min to roll R, max to roll L, Supine<>sit mod A, squat pivot mod x 1, STS mod A x 1,  min A transfers, mod A gait  BP management, transfers, gait   Communication   Mod-Min , increased ability to express wants/needs, working on higher level word finding/organization  sup A for expression, min A for comprehension   higher level word finding, sentence reading   Safety/Cognition/ Behavioral Observations  mod-min A  supervision  recall with aids, mildly complex problem solving, error awareness   Pain   Pt denies pain  remains pain free  Assess for pain qshift   Skin   Bruising scattered on abd and R eye. No other  breakdown or injuries present  Pt skin remains free of injury  Assess skin qshift and provide skin care     Discharge Planning:  Husand and daughter are here daily and provide support to pt-aware of her care needs. Will need multiple days of education prior to going home   Team Discussion: Red mews yesterday due to BP 67/41. Vomiting with therapy. Stopped Norvasc. Added Ab binder, ted hose, and ace wraps. Not voiding. Spasticity of right side. Celexa started last week. I&O caths q 8 hr. Continent bowel, LBM 12/19/2020. No pain reported. Trazodone for sleep. Educating on I&O caths, medications. Barriers are mobility and blood pressure. Squat pivot to the left. Completed right side transfer. Barriers are motor planning and initiation. Nausea/dizziness improved. BP still dropping. Can express thoughts well. Working on structured tasks, mild level complex problem solving. Mod assist. Memory improving.  Patient on target to meet rehab goals: yes, BP continues to be an issue.   *See Care Plan and progress notes for long and short-term goals.   Revisions to Treatment Plan:  MD adjusting medications.  Teaching Needs: Family education, medication management, BP management, I&O cath  education, bladder management, safety awareness, transfer training, gait training, balance training, endurance training.  Current Barriers to Discharge: Decreased caregiver support, Medical stability, Home enviroment access/layout, Neurogenic bowel and bladder, Lack of/limited family support, Insurance for SNF coverage, Weight, Weight bearing restrictions, and Medication compliance  Possible Resolutions to  Barriers: Medication adjustments addressed. Bladder management addressed. BP management addressed.      Medical Summary Current Status: mood better since started Celexa; Low BP/orthostatic hypotension- 61/42 yesterday- LBM 10/2; urinary retention- needing i/o caths;  Barriers to Discharge: Home enviroment access/layout;Neurogenic Bowel & Bladder;Medical stability;Weight;Weight bearing restrictions  Barriers to Discharge Comments: home with family who's here daily; - orthostaitc hypotension and urinary retention are biggest limiters Possible Resolutions to [employer]: focus- low BP- use abd binder/TEDs/ACE wraps- stopped Norvasc; increased florinef; con't midodrine; vestibular issues MUCH better; nausea also improved; poor motor planning- aphasia doing better- less circumlocution- mod A higher level cognition; d/c- 10/19- will re-eval at next conference   Continued Need for Acute Rehabilitation Level of Care: The patient requires daily medical management by a physician with specialized training in physical medicine and rehabilitation for the following reasons: Direction of a multidisciplinary physical rehabilitation program to maximize functional independence : Yes Medical management of patient stability for increased activity during participation in an intensive rehabilitation regime.: Yes Analysis of laboratory values and/or radiology reports with any subsequent need for medication adjustment and/or medical intervention. : Yes   I attest that I was present, lead the team conference, and concur with the assessment and plan of the team.   Cristi Loron 12/21/2020, 6:06 PM

## 2020-12-21 NOTE — Progress Notes (Signed)
[place] PHYSICAL MEDICINE & REHABILITATION PROGRESS NOTE  Subjective/Complaints:  Pt reports had episode of low BP again yesterday during therapy- vomited x1- BP was 80s/60s-  No more N/V this AM- feels good this AM.   Had red MEWS 62/41 per chart.  Of note, didn't get ACE wraps of LE's per pt/daughter?  ROS:   Pt denies SOB, abd pain, CP, N/V/C/D, and vision changes   Objective: Vital Signs: Blood pressure (!) 91/53, pulse 71, temperature 98.5 F (36.9 C), temperature source Oral, resp. rate 18, height 5' 7.99" (1.727 m), weight 104.3 kg, SpO2 95 %. No results found. Recent Labs    12/21/20 0503  WBC 7.4  HGB 13.7  HCT 40.4  PLT 258     Recent Labs    12/21/20 0503  NA 136  K 3.8  CL 103  CO2 25  GLUCOSE 101*  BUN 15  CREATININE 0.73  CALCIUM 9.2      Intake/Output Summary (Last 24 hours) at 12/21/2020 0853 Last data filed at 12/21/2020 0600 Gross per 24 hour  Intake 236 ml  Output 1497 ml  Net -1261 ml        Physical Exam: BP (!) 91/53   Pulse 71   Temp 98.5 F (36.9 C) (Oral)   Resp 18   Ht 5' 7.99" (1.727 m)   Wt 104.3 kg   SpO2 95%   BMI 34.97 kg/m      General: awake, alert, appropriate, daughter at bedside;  NAD HENT: conjugate gaze; oropharynx moist CV: regular rate; no JVD Pulmonary: CTA B/L; no W/R/R- good air movement GI: soft, NT, ND, (+)BS Psychiatric: appropriate Neurological: alert- aphasia is better Hoffman's RUE and few beats clonus RLE  Extremities: No clubbing, cyanosis, or edema Skin: No evidence of breakdown, no evidence of rash A and O x 3   Tone with clonus RIght ankle  She does some difficulty differentiating right from left. Motor: RUE/RLE: 0/5 proximal distal- RUE 0/5 still- splints off.  Sensation diminished to light touch RUE/RLE   Assessment/Plan: 1. Functional deficits which require 3+ hours per day of interdisciplinary therapy in a comprehensive inpatient rehab setting. Physiatrist is providing  close team supervision and 24 hour management of active medical problems listed below. Physiatrist and rehab team continue to assess barriers to discharge/monitor patient progress toward functional and medical goals   Care Tool:  Bathing    Body parts bathed by patient: Right arm, Chest, Abdomen, Right upper leg, Left upper leg, Face   Body parts bathed by helper: Left arm, Front perineal area, Buttocks, Right lower leg, Left lower leg     Bathing assist Assist Level: Total Assistance - Patient < 25%     Upper Body Dressing/Undressing Upper body dressing   What is the patient wearing?: Pull over shirt    Upper body assist Assist Level: Maximal Assistance - Patient 25 - 49%    Lower Body Dressing/Undressing Lower body dressing      What is the patient wearing?: Pants     Lower body assist Assist for lower body dressing: Maximal Assistance - Patient 25 - 49%     Toileting Toileting    Toileting assist Assist for toileting: Minimal Assistance - Patient > 75%     Transfers Chair/bed transfer  Transfers assist     Chair/bed transfer assist level: Moderate Assistance - Patient 50 - 74%     Locomotion Ambulation   Ambulation assist   Ambulation activity did not occur: Safety/medical concerns  Assist level: Maximal Assistance - Patient 25 - 49% Assistive device:  (hall rail) Max distance: 10   Walk 10 feet activity   Assist  Walk 10 feet activity did not occur: Safety/medical concerns  Assist level: Maximal Assistance - Patient 25 - 49% Assistive device:  (hall rail)   Walk 50 feet activity   Assist Walk 50 feet with 2 turns activity did not occur: Safety/medical concerns         Walk 150 feet activity   Assist Walk 150 feet activity did not occur: Safety/medical concerns         Walk 10 feet on uneven surface  activity   Assist Walk 10 feet on uneven surfaces activity did not occur: Safety/medical concerns          Wheelchair     Assist Is the patient using a wheelchair?: Yes Type of Wheelchair: Manual Wheelchair activity did not occur: Safety/medical concerns  Wheelchair assist level: Dependent - Patient 0%      Wheelchair 50 feet with 2 turns activity    Assist    Wheelchair 50 feet with 2 turns activity did not occur: Safety/medical concerns       Wheelchair 150 feet activity     Assist  Wheelchair 150 feet activity did not occur: Safety/medical concerns        Medical Problem List and Plan: 1.  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  Impaired mobility -DVT/anticoagulation:  Pharmaceutical: Continue Heparin             -antiplatelet therapy: N/A 3. Pain Management: As needed meds 4. Anxiety/depression: Continue Xanax 0.5 mg 3 times daily, BuSpar 5 mg twice daily increase to TID , hydroxyzine 25 mg 4 times daily- d/c atarax due to urinary retention  9/29- will start Celexa 20 mg daily for severe new onset depression. 10/3- feels mood is doing better- con't regimen              -antipsychotic agents: N/A 5. Neuropsych: This patient is not fully capable of making decisions on her own behalf. 6. Skin/Wound Care: Routine skin checks 7. Fluids/Electrolytes/Nutrition: Routine in and outs 8.  Hypertension.  Continue Norvasc 5 mg daily, Cozaar 50 mg twice daily.               Monitor with increased mobility Vitals:   12/21/20 0548 12/21/20 0759  BP: (!) 97/57 (!) 91/53  Pulse: 80 71  Resp: 18   Temp: 98.5 F (36.9 C)   SpO2: 95%   Running on low side reduce cozaar to 25mg  BID on 9/26  9/28- use Abd binder and add for low BP.   9/29- will start Florinef 0.1 mg daily- might also need midodrine?- If BP better in a few days, will start Flomax for urinary retention. Check orthostatics daily.   9/30- will con't Abd  binder/TEDS/ACE wraps- and monitor- looks a little better this AM 100/58 10/1- reduce cozaar to 25mg  qd, amlodipine to 2.5 mgwould reduce BP meds before starting midodrine or increasing florinef  10/2- will con't fLORINEF AND ADD mIDRODRINE 5 MG tid WITH MEALS/FIRST DOSE 1601UX.   10/3- started Midodrine since BP was so low yesterday- 60s/40s- will reduce Norvasc to 2.5 mg daily.  Con't Losartan 25 mg BID for now.   10/4- will stop Norvasc completely; increase Florinef to 0.2 mg  daily with red MEWS 61/42 yesterday and 90s/50s this AM- and give 0.1 mg x1 of Florinef to increase dose for today- also left note for therapy to use TEDS and ACE wraps.  9.  Hyperlipidemia: Lipitor 10.  COVID-19.  Completed course of remdesivir.  Currently completing course of dexamethasone 11.  Obesity.  BMI 39.52.  Dietary follow-up.  Encourage weight loss  9/29- BMI 34.97- improving.  12.  Leukocytosis-likely secondary to steroids             Afebrile, no signs/symptoms of infection  9/27- no labs yesterday- will check in AM  9/28- WBC down to 11k- con't to monitor weekly.  13.  Transaminitis  ALT elevated on 9/21, continue to monitor  9/27- will recheck CMP in AM  9/29- LFTs 21/60- doing better- con't to monitor  10/3- will recheck labs in AM 14. Orthostatic hypotension: continue TEDs, added abdominal binder in case this is not enough. Improved  reduce cozaar to 25mg  daily , amlodipine to 2.5mg  15.  Urinary retention add urecholine increase to 10mg  QID , now off anticholinergic agents , BP too low for flomax also with orthostasis   9/27- cannot add Flomax due to low BP- is NOT voiding at all- all in/out caths-   10/4- still not voiding- but BP still too low for Flomax.  16. Nausea  9/27- will schedule Zofran 4 mg at 6am daily to help with nausea and monitor   9/28- doing better- con't regimen.  17. Spasticity  9/30- developing low level spasticity in RUE- educated pt and daughter on spasticity and how  progresses; what it means- that can masquerade as strength at low levels, etc  10/3- developing slowly- will monitor and wait on meds.    LOS: 14 days A FACE TO FACE EVALUATION WAS PERFORMED  Felicia Chen 12/21/2020, 8:53 AM

## 2020-12-21 NOTE — Progress Notes (Signed)
Physical Therapy Session Note  Patient Details  Name: Felicia Chen MRN: [id number] Date of Birth: 1965-05-12  Today's Date: 12/21/2020 PT Individual Time: 1330-1415 PT Individual Time Calculation (min): 45 min   Short Term Goals: Week 2:  PT Short Term Goal 1 (Week 2): Pt will demonstrate STS with RW and mod A PT Short Term Goal 2 (Week 2): Pt's will perform standing mobility x 2 min without orthostatic symptoms. PT Short Term Goal 3 (Week 2): Pt will perform gait with max A x 10 ft  Skilled Therapeutic Interventions/Progress Updates:     Pt in TIS w/c on arrival, family at bedside, pt agreeable to PT treatment. She denies any pain. She has TED's on and we donned abdominal binder with totalA. BP assessed reading 119/98, HR 95 while resting. Transported to main rehab hallway and ace wrapped RLE for DF assist. Initiated functional gait training in hallway with L hand rail and +2 assist for w/c follow for safety - required modA for balance and totalA for RLE advancement/placement/blocking - ambulated length of rail ~24ft + ~18ft with seated rest break b/w trials. Cues throughout for normalizing gait pattern, reducing excessive step length on L, and squaring shoulders down hallway due to L thoracic rotation. Pt denies any symptoms of hypotension during session. Wheeled to main rehab gym for energy conservation and assisted to mat table via lateral scoot transfer with modA for hip translation and managing her weaker RLE. At edge of mat, worked on sit<>stands with modA for blocking RLE and facilitating forward weight shift and upward translation of the trunk to achieve full upright posture. She completed 1x5 with brief rest breaks b/w efforts. Assisted back to her w/c via lateral scoot transfer with min/modA, towards her stronger L side. She was reclined for comfort and returned to her room in TIS w/c. Reclined slightly for safety at end of session with safety belt alarm on and pillow supporting  paretic RUE. All needs within reach and pt in good spirits after avoiding hypotensive episodes during session.   Therapy Documentation Precautions:  Precautions Precautions: Fall Precaution Comments: R hemi, inattention. SBP parameters <160 per neuro on acute Restrictions Weight Bearing Restrictions: No General:    Therapy/Group: Individual Therapy  Minoru Chap P [name] 12/21/2020, 12:50 PM

## 2020-12-21 NOTE — Progress Notes (Signed)
Speech Language Pathology Daily Session Note  Patient Details  Name: Felicia Chen MRN: [id number] Date of Birth: 19-Jun-1965  Today's Date: 12/21/2020 SLP Individual Time: 0803-0905 SLP Individual Time Calculation (min): 62 min  Short Term Goals: Week 3: SLP Short Term Goal 1 (Week 3): Pt will demonstrate reading comprehenison at the sentence level with 80% accuracy and supervision A verbal cues. SLP Short Term Goal 2 (Week 3): Pt will complete mildly complex tasks with mod A verbal cues. SLP Short Term Goal 3 (Week 3): Pt will self-monitor and self-correct functional and functional/verbal errors with min A verbal cues. SLP Short Term Goal 4 (Week 3): Pt will demonstrate word finding skills in higher level tasks (ex: describing, convergent/divergent naming) with min A verbal cues. SLP Short Term Goal 5 (Week 3): Pt will demonstrate recall of daily, novel information/events with supervision A verbal cues for compensatory strategies (written aids, memory notebooks).  Skilled Therapeutic Interventions:Skilled ST services focused on cognitive skills. SLP facilitated basic problem solving, short term recall, working memory and error awareness skills in simple change counting and displaying task from [place]. Pt required max A verbal cues for organization to count change, however when broken down step by step and SLP providing written cues for recall within task pt required mod A verbal cues for problem solving/error awareness and min A verbal cues to refer to written cues for recall. Pt became upset by novel difficulty with simple task, SLP provided emotional support. SLP facilitated medication management with verbal problem solving questions for medications 1-4x per day, pt initially required mod A verbal cues quickly fading to min A verbal cues once pt appeared to grasp concept. Pt was able to complete ALFA medication management task (mildly complex problem solving) with min A verbal cues and  supervision A verbal cues to scan right when reading medication labels. SLP created personal medication list, educating on medication name/functional/times per day and required min A fading to supervision A verbal cues for verbal problem solving with personal medication. Pt was able to recall today's ST events with supervision A verbal cues as SLP recorded events in memory notebook. Pt was left in room with call bell within reach and bed alarm set. SLP recommends to continue skilled services.     Pain Pain Assessment Pain Scale: 0-10 Pain Score: 0-No pain  Therapy/Group: Individual Therapy  Lashay Osborne  [facility] 12/21/2020, 11:02 AM

## 2020-12-21 NOTE — Progress Notes (Signed)
Occupational Therapy Session Note  Patient Details  Name: Felicia Chen MRN: [id number] Date of Birth: 1965/03/27  Today's Date: 12/21/2020 OT Individual Time: 1031-1130 and [id number] OT Individual Time Calculation (min): 59 min and 39 min   Short Term Goals: Week 1:  OT Short Term Goal 1 (Week 1): Pt will perform sit <> stand at LRAD with MOD of 1 OT Short Term Goal 1 - Progress (Week 1): Met OT Short Term Goal 2 (Week 1): Pt will thread BLEs into pants with AE PRN OT Short Term Goal 2 - Progress (Week 1): Progressing toward goal OT Short Term Goal 3 (Week 1): Pt will perform BSC/toilet transfer with MOD of 1 OT Short Term Goal 3 - Progress (Week 1): Progressing toward goal OT Short Term Goal 4 (Week 1): Pt will perform self ROM for RUE with no more than Min cues OT Short Term Goal 4 - Progress (Week 1): Progressing toward goal Week 2:  OT Short Term Goal 1 (Week 2): Pt will perform BSC/toilet transfer with Mod A of 1 OT Short Term Goal 2 (Week 2): Pt will perform RUE PROM HEP with no more than Min cues OT Short Term Goal 3 (Week 2): Pt will perform sit <> stands in prep for ADL with Min A OT Short Term Goal 4 (Week 2): Pt will perform LB dress w/ Mod A and AE PRN   Skilled Therapeutic Interventions/Progress Updates:    Session 1: Pt greeted at time of session semireclined in bed, no pain reported and eager to participate. Donned TEDS total A prior to sitting EOB supine > sit Mod A with cues to cross ankles. Sitting EOB BP 119/62. Squat pivot bed > TIS toward L side with Mod A and 2nd helper spotting for safety. Up in TIS, BP checked and 119/76. Retrieved bariatric DABSC from storage and placed over commode. Squat pivot TIS > BSC over toilet with Mod A and again 2nd helper for safety and this time transferred to R side which is new for this pt. Sit <> stand at commode Mod A with HHA and 2nd helper to don/doff clothing for practice, but unable to have continent void on commode.  Partial stand pivot back to wheelchair toward L side with Mod A of 1 and second helper for safety. BP 108/80 s/p activity. Remainder of session focused on NMR of RUE at shoulder in scaption (to prevent sublux) and wrist/digit extensors (to promote tenodesis) both on 11 clicks. When working on wrist/digits, pt Financial controller working on IT sales professional. Saebo applied to R shoulder deltoid to prevent subluxation, tolerated for 60 minutes. Skin in tact pre and post for all. See below for details. Pt up in chair alarm on call bell in reach semireclined in TIS.  Saebo Stim One 330 pulse width 35 Hz pulse rate On 8 sec/ off 8 sec Ramp up/ down 2 sec Symmetrical Biphasic wave form Max intensity 138mA at 500 Ohm load    Session 2: Pt greeted at time of session sitting up in TIS chair with binder on and agreeable to OT session, no pain reported throughout. BP in sitting 110/67 and pt asymptomatic throughout session, no reports of dizziness. Transported to gym and performed SCIFIT seated with ace wrap on R hand for support, on level 4 and therapist support at scapula and elbow support for ROM. Also attempted constraint induced movement therapy with using RUE only for rotational movement for SCIFIT, needing total A for movement. Squat pivot wheelchair <>  mat with Mod A, attempting to have the pt perform squat pivot but continuously attempted standing. Therapist providing R knee block and support at hips. Focused on EOM for reciprocal scooting but severe difficulty sequencing and following commands. Back in room, partial stand pivot/squat pivot TIS > bed with Min/Mod A toward L side. Note 2nd helper present throughout for safety during transfers. Sit > supine Min A with using LLE under RLE. Alarm on call bell in reach   Therapy Documentation Precautions:  Precautions Precautions: Fall Precaution Comments: R hemi, inattention. SBP parameters <160 per neuro on acute Restrictions Weight Bearing  Restrictions: Yes    Therapy/Group: Individual Therapy  [name] 12/21/2020, 7:19 AM

## 2020-12-22 MED ORDER — AMANTADINE HCL 100 MG PO CAPS
100.0000 mg | ORAL_CAPSULE | Freq: Every day | ORAL | Status: DC
  Administered 2020-12-22 – 2020-12-28 (×7): 100 mg via ORAL
  Filled 2020-12-22 (×7): qty 1

## 2020-12-22 NOTE — Progress Notes (Signed)
Occupational Therapy Weekly Progress Note  Patient Details  Name: Felicia Chen MRN: [id number] Date of Birth: [birth year]  Beginning of progress report period: December 16, 2020 End of progress report period: December 22, 2020  Today's Date: 12/22/2020 OT Individual Time: 7616-0737 OT Individual Time Calculation (min): 45 min  and Today's Date: 12/22/2020 OT Missed Time: 15 Minutes Missed Time Reason: Nursing care (cathing)   Patient has met 1 of 4 short term goals.  Pt is slowly progressing toward OT goals and has politely declined ADL tasks during most sessions in favor of focusing on standing balance and NMR. Pt is Max A for LB dressing, but progressing to threading and getting over hips at beg level. Bed mobility varies d/t hemiparesis and poor sequencing. Squat pivot transfers to L side Mod A progressing to Min A. Sit <> stands at MOD A for therapist and staff to don/doff clothing. RUE continues to be primarily flaccid but does have beginning trace contraction at shoulder. Family is very supportive and present for many sessions, education ongoing. Pt is very pleasant and motivated. Blood pressure has been significantly limiting but recently improving, managing with TEDS, binder, and ace.   Patient continues to demonstrate the following deficits: muscle weakness, decreased cardiorespiratoy endurance, impaired timing and sequencing, abnormal tone, unbalanced muscle activation, decreased coordination, and decreased motor planning, decreased attention, decreased awareness, decreased problem solving, decreased safety awareness, and decreased memory, and decreased sitting balance, decreased standing balance, decreased postural control, hemiplegia, and decreased balance strategies and therefore will continue to benefit from skilled OT intervention to enhance overall performance with BADL and Reduce care partner burden.  Patient progressing toward long term goals..  Continue plan of care.  OT  Short Term Goals Week 2:  OT Short Term Goal 1 (Week 2): Pt will perform BSC/toilet transfer with Mod A of 1 OT Short Term Goal 1 - Progress (Week 2): Met OT Short Term Goal 2 (Week 2): Pt will perform RUE PROM HEP with no more than Min cues OT Short Term Goal 2 - Progress (Week 2): Progressing toward goal OT Short Term Goal 3 (Week 2): Pt will perform sit <> stands in prep for ADL with Min A OT Short Term Goal 3 - Progress (Week 2): Progressing toward goal OT Short Term Goal 4 (Week 2): Pt will perform LB dress w/ Mod A and AE PRN OT Short Term Goal 4 - Progress (Week 2): Progressing toward goal Week 3:  OT Short Term Goal 1 (Week 3): Pt will perform sit <> stands at [place] with Min A in prep for ADL OT Short Term Goal 2 (Week 3): Pt will perform squat pivot transfer to BSC/toilet with Min A OT Short Term Goal 3 (Week 3): Pt will perform LB dressing with Mod A OT Short Term Goal 4 (Week 3): Pt will improve awareness attending to RUE during ADL tasks in 3 consecutive sessions  Skilled Therapeutic Interventions/Progress Updates:    Pt greeted at time of session in bed finishing up cathing with NT. Missed 15 mins at beginning of session 2/2 cathing. Due to time constraints, will plan on doing shower in another session. Brief change bed level with total A with pt rolling to R side Min A and L side Max A. Donned TEDS dependent for time and pants back on with Mod/Max A assist to thread RLE pt able to thread LLE and help get over hips, eventually needing to roll to fully get up. Supine > sit Mod A  with cues to cross ankles. BP in sitting 113/64. Squat pivot to L side Min/Mod A to TIS. Up in chair BP 112/84. Transported to gym and focused on standing balance at high/low table with Mod A for sit <> stand and standing approx 2-3 minutes with BUE support, weight bearing through [place] and BLEs for weight bearing. Also attempting lateral weight shifts in standing, significant R knee buckling and less tone noted  today in standing. Transported back to room, RUE elevated and alarm on call bell in reach. 2nd helper present throughout session for safety during transfers.   Therapy Documentation Precautions:  Precautions Precautions: Fall Precaution Comments: R hemi, inattention. SBP parameters <160 per neuro on acute Restrictions Weight Bearing Restrictions: No    Therapy/Group: Individual Therapy  [name] 12/22/2020, 7:17 AM

## 2020-12-22 NOTE — Progress Notes (Signed)
Recreational Therapy Session Note  Patient Details  Name: AMIAYAH GIEBEL MRN: [id number] Date of Birth: 24-May-1965 Today's Date: 12/22/2020  Pain: no c/o Pt participated in animal assisted activity w/c level petting the dog and conversing with the pet partner team about her personal dogs with supervision.  Pts 2 daughters present and participatory in this visit and all appreciative.   Leighanne Adolph 12/22/2020, 11:57 AM

## 2020-12-22 NOTE — Progress Notes (Signed)
Speech Language Pathology Daily Session Note  Patient Details  Name: Felicia Chen MRN: [id number] Date of Birth: July 08, 1965  Today's Date: 12/22/2020 SLP Individual Time: 1105-1203 SLP Individual Time Calculation (min): 58 min  Short Term Goals: Week 3: SLP Short Term Goal 1 (Week 3): Pt will demonstrate reading comprehenison at the sentence level with 80% accuracy and supervision A verbal cues. SLP Short Term Goal 2 (Week 3): Pt will complete mildly complex tasks with mod A verbal cues. SLP Short Term Goal 3 (Week 3): Pt will self-monitor and self-correct functional and functional/verbal errors with min A verbal cues. SLP Short Term Goal 4 (Week 3): Pt will demonstrate word finding skills in higher level tasks (ex: describing, convergent/divergent naming) with min A verbal cues. SLP Short Term Goal 5 (Week 3): Pt will demonstrate recall of daily, novel information/events with supervision A verbal cues for compensatory strategies (written aids, memory notebooks).  Skilled Therapeutic Interventions:Skilled ST services focused on cognitive skills. SLP facilitated complex problem solving, recall and error awareness skills in QID pill organizer task. Pt required min A fading to mod I, mainly for cues to continue task and organization. Pt demonstrated good alternating attention stopping and starting task when therapy dog entered room. Pt demonstrated recall from yesterday's tasks with min A verbal cues and only supervision A verbal cues to utilize memory notebook.  Pt was left in room with daughters, call bell within reach and chair alarm set. SLP recommends to continue skilled services.     Pain Pain Assessment Pain Score: 0-No pain  Therapy/Group: Individual Therapy  [name]  [facility] 12/22/2020, 4:02 PM

## 2020-12-22 NOTE — Progress Notes (Signed)
[place] PHYSICAL MEDICINE & REHABILITATION PROGRESS NOTE  Subjective/Complaints:  Pt reports last 24 hours BP has been great- no drops and no nausea; no feeling bad.  In general feeling much better.  Still not peeing. Motor planning still an issue with PT and OT- appetite better.   ROS:   Pt denies SOB, abd pain, CP, N/V/C/D, and vision changes   Objective: Vital Signs: Blood pressure 106/66, pulse 79, temperature 98.4 F (36.9 C), temperature source Oral, resp. rate 18, height 5' 7.99" (1.727 m), weight 104.3 kg, SpO2 96 %. No results found. Recent Labs    12/21/20 0503  WBC 7.4  HGB 13.7  HCT 40.4  PLT 258     Recent Labs    12/21/20 0503  NA 136  K 3.8  CL 103  CO2 25  GLUCOSE 101*  BUN 15  CREATININE 0.73  CALCIUM 9.2      Intake/Output Summary (Last 24 hours) at 12/22/2020 1440 Last data filed at 12/22/2020 0930 Gross per 24 hour  Intake 240 ml  Output 1100 ml  Net -860 ml        Physical Exam: BP 106/66 (BP Location: Left Arm)   Pulse 79   Temp 98.4 F (36.9 C) (Oral)   Resp 18   Ht 5' 7.99" (1.727 m)   Wt 104.3 kg   SpO2 96%   BMI 34.97 kg/m      General: awake, alert, appropriate, sitting up in bed; alone this AM; NAD HENT: conjugate gaze; oropharynx moist CV: regular rate; no JVD Pulmonary: CTA B/L; no W/R/R- good air movement GI: soft, NT, ND, (+)BS Psychiatric: appropriate- brighter affect Neurological: alert- aphasia is better- at sentence level Hoffman's RUE and few beats clonus RLE R wrist splint in place;  Extremities: No clubbing, cyanosis, or edema Skin: No evidence of breakdown, no evidence of rash A and O x 3   Tone with clonus RIght ankle  She does some difficulty differentiating right from left. Motor: RUE/RLE: 0/5 proximal distal- RUE 0/5 still- splints off.  Sensation diminished to light touch RUE/RLE   Assessment/Plan: 1. Functional deficits which require 3+ hours per day of interdisciplinary therapy in a  comprehensive inpatient rehab setting. Physiatrist is providing close team supervision and 24 hour management of active medical problems listed below. Physiatrist and rehab team continue to assess barriers to discharge/monitor patient progress toward functional and medical goals   Care Tool:  Bathing    Body parts bathed by patient: Right arm, Chest, Abdomen, Right upper leg, Left upper leg, Face   Body parts bathed by helper: Left arm, Front perineal area, Buttocks, Right lower leg, Left lower leg     Bathing assist Assist Level: Total Assistance - Patient < 25%     Upper Body Dressing/Undressing Upper body dressing   What is the patient wearing?: Pull over shirt    Upper body assist Assist Level: Maximal Assistance - Patient 25 - 49%    Lower Body Dressing/Undressing Lower body dressing      What is the patient wearing?: Pants     Lower body assist Assist for lower body dressing: Maximal Assistance - Patient 25 - 49%     Toileting Toileting    Toileting assist Assist for toileting: 2 Helpers     Transfers Chair/bed transfer  Transfers assist     Chair/bed transfer assist level: Moderate Assistance - Patient 50 - 74%     Locomotion Ambulation   Ambulation assist   Ambulation activity  did not occur: Safety/medical concerns  Assist level: Maximal Assistance - Patient 25 - 49% Assistive device:  (hall rail) Max distance: 10   Walk 10 feet activity   Assist  Walk 10 feet activity did not occur: Safety/medical concerns  Assist level: Maximal Assistance - Patient 25 - 49% Assistive device:  (hall rail)   Walk 50 feet activity   Assist Walk 50 feet with 2 turns activity did not occur: Safety/medical concerns         Walk 150 feet activity   Assist Walk 150 feet activity did not occur: Safety/medical concerns         Walk 10 feet on uneven surface  activity   Assist Walk 10 feet on uneven surfaces activity did not occur:  Safety/medical concerns         Wheelchair     Assist Is the patient using a wheelchair?: Yes Type of Wheelchair: Manual Wheelchair activity did not occur: Safety/medical concerns  Wheelchair assist level: Dependent - Patient 0%      Wheelchair 50 feet with 2 turns activity    Assist    Wheelchair 50 feet with 2 turns activity did not occur: Safety/medical concerns       Wheelchair 150 feet activity     Assist  Wheelchair 150 feet activity did not occur: Safety/medical concerns        Medical Problem List and Plan: 1.  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  2.  Impaired mobility -DVT/anticoagulation:  Pharmaceutical: Continue Heparin             -antiplatelet therapy: N/A 3. Pain Management: As needed meds 4. Anxiety/depression: Continue Xanax 0.5 mg 3 times daily, BuSpar 5 mg twice daily increase to TID , hydroxyzine 25 mg 4 times daily- d/c atarax due to urinary retention  9/29- will start Celexa 20 mg daily for severe new onset depression. 110/5- mood much better- con't regimen             -antipsychotic agents: N/A 5. Neuropsych: This patient is not fully capable of making decisions on her own behalf. 6. Skin/Wound Care: Routine skin checks 7. Fluids/Electrolytes/Nutrition: Routine in and outs 8.  Hypertension.  Continue Norvasc 5 mg daily, Cozaar 50 mg twice daily.               Monitor with increased mobility Vitals:   12/22/20 0506 12/22/20 1429  BP: (!) 98/56 106/66  Pulse: 78 79  Resp: 16 18  Temp: 98.7 F (37.1 C) 98.4 F (36.9 C)  SpO2: 96%     9/30- will con't Abd binder/TEDS/ACE wraps- and monitor- looks a little better this AM 100/58 10/1- reduce cozaar to 25mg  qd, amlodipine to 2.5 mgwould reduce BP  meds before starting midodrine or increasing florinef  10/2- will con't fLORINEF AND ADD mIDRODRINE 5 MG tid WITH MEALS/FIRST DOSE 0177LT.   10/3- started Midodrine since BP was so low yesterday- 60s/40s- will reduce Norvasc to 2.5 mg daily.  Con't Losartan 25 mg BID for now.   10/4- will stop Norvasc completely; increase Florinef to 0.2 mg daily with red MEWS 61/42 yesterday and 90s/50s this AM- and give 0.1 mg x1 of Florinef to increase dose for today- also left note for therapy to  use TEDS and ACE wraps.   10/5- best day for BP yesterday- will give Amantadine and monitor BP closely- might need to reduce Losartan? 9.  Hyperlipidemia: Lipitor 10.  COVID-19.  Completed course of remdesivir.  Currently completing course of dexamethasone 11.  Obesity.  BMI 39.52.  Dietary follow-up.  Encourage weight loss  9/29- BMI 34.97- improving.  12.  Leukocytosis-likely secondary to steroids             Afebrile, no signs/symptoms of infection  9/27- no labs yesterday- will check in AM  9/28- WBC down to 11k- con't to monitor weekly.  13.  Transaminitis  ALT elevated on 9/21, continue to monitor  9/27- will recheck CMP in AM  9/29- LFTs 21/60- doing better- con't to monitor  10/3- will recheck labs in AM 14. Orthostatic hypotension: continue TEDs, added abdominal binder in case this is not enough. Improved  reduce cozaar to 25mg  daily , amlodipine to 2.5mg  15.  Urinary retention add urecholine increase to 10mg  QID , now off anticholinergic agents , BP too low for flomax also with orthostasis   10/5- still cannot add Flomax- BP still too low- not voiding- but cathing q6-8 hours-  16. Nausea  9/27- will schedule Zofran 4 mg at 6am daily to help with nausea and monitor   9/28- doing better- con't regimen.  17. 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  18. Aphasia/poor motor planning  10/5- adding Amantadine 100 mg daily-   LOS: 15 days A FACE TO FACE EVALUATION WAS PERFORMED  Felicia Chen 12/22/2020, 2:40 PM

## 2020-12-22 NOTE — Progress Notes (Addendum)
Physical Therapy Session Note  Patient Details  Name: Felicia Chen MRN: [id number] Date of Birth: 1965/08/24  Today's Date: 12/22/2020 PT Individual Time: 1415-1530 PT Individual Time Calculation (min): 75 min   Short Term Goals: Week 1:  PT Short Term Goal 1 (Week 1): Pt will initiate gait training PT Short Term Goal 1 - Progress (Week 1): Met PT Short Term Goal 2 (Week 1): Pt will perform STS with mod A of 1 PT Short Term Goal 2 - Progress (Week 1): Met PT Short Term Goal 3 (Week 1): Pt will tolerate OOB sitting between therapy sessions PT Short Term Goal 3 - Progress (Week 1): Met Week 2:  PT Short Term Goal 1 (Week 2): Pt will demonstrate STS with RW and mod A PT Short Term Goal 2 (Week 2): Pt's will perform standing mobility x 2 min without orthostatic symptoms. PT Short Term Goal 3 (Week 2): Pt will perform gait with max A x 10 ft  Skilled Therapeutic Interventions/Progress Updates:    pt received in bed and agreeable to therapy with her daughters present. No complaint of pain. Pt reports she had episode of not feeling well and becoming emotional after weighting >45 min for nsg to assist her back to bed. Pt reports she is feeling better after resting.   Transfers: Supine<>sit with min A to manage RLE, scooting to [facility] with min A. Pt performed squat pivot to L side with mod A x 1. Squat pivot to R side x 4 with mod A, min A x 1 with repetition. Pt required max VC for technique and motor planning.   TherActivity: Pt performed scoots BIL the length of mat table for improved mobility during transfers. Pt then stood with mod A to power up and max A to manage RLE. Pt directed in weight shifting onto RLE. Pt then stood with maxi sky for improved confidence and stability. Gait x 4 ft fwd and backward with maxisky and max A to manage RLE. Pt then had difficulty standing on LLE and reported feeling nausea and disoriented.   BP taken EOB: 106.88 BP after standing activity sitting on  mat : 119/89  Pt returned to room and to bed as written above and was left with all needs in reach and alarm active.   Therapy Documentation Precautions:  Precautions Precautions: Fall Precaution Comments: R hemi, inattention. SBP parameters <160 per neuro on acute Restrictions Weight Bearing Restrictions: No    Therapy/Group: Individual Therapy  Mickel Fuchs 12/22/2020, 3:42 PM

## 2020-12-23 MED ORDER — SENNOSIDES-DOCUSATE SODIUM 8.6-50 MG PO TABS
2.0000 | ORAL_TABLET | Freq: Two times a day (BID) | ORAL | Status: DC
  Administered 2020-12-23 – 2021-01-05 (×26): 2 via ORAL
  Filled 2020-12-23 (×26): qty 2

## 2020-12-23 MED ORDER — MIDODRINE HCL 5 MG PO TABS
10.0000 mg | ORAL_TABLET | Freq: Three times a day (TID) | ORAL | Status: DC
  Administered 2020-12-23 – 2021-01-05 (×39): 10 mg via ORAL
  Filled 2020-12-23 (×39): qty 2

## 2020-12-23 MED ORDER — ENOXAPARIN SODIUM 40 MG/0.4ML IJ SOSY
40.0000 mg | PREFILLED_SYRINGE | INTRAMUSCULAR | Status: DC
  Administered 2020-12-23 – 2021-01-05 (×14): 40 mg via SUBCUTANEOUS
  Filled 2020-12-23 (×14): qty 0.4

## 2020-12-23 NOTE — Progress Notes (Signed)
Speech Language Pathology Daily Session Note  Patient Details  Name: Felicia Chen MRN: [id number] Date of Birth: 1965/09/07  Today's Date: 12/23/2020 SLP Individual Time: 0800-0900 SLP Individual Time Calculation (min): 60 min  Short Term Goals: Week 1: SLP Short Term Goal 1 (Week 1): Pt will participate in further language assessment. SLP Short Term Goal 1 - Progress (Week 1): Met SLP Short Term Goal 2 (Week 1): Pt will demonstrate ability to follow 1 step directions with 80% accuracy given mod A multimodal cues. SLP Short Term Goal 2 - Progress (Week 1): Met SLP Short Term Goal 3 (Week 1): Pt will demonstrate word finding strategies in higher level tasks such as divergent naming with mod A semantic cues. SLP Short Term Goal 3 - Progress (Week 1): Met SLP Short Term Goal 4 (Week 1): Pt will demonstrate recall of personal information, as well as daily and novel information with mod A verbal cues. SLP Short Term Goal 4 - Progress (Week 1): Not met SLP Short Term Goal 5 (Week 1): Pt will demonstrated recall of personal information, as well as daily and novel information with supervision A verbal cues for memory notebook. SLP Short Term Goal 5 - Progress (Week 1): Not met SLP Short Term Goal 6 (Week 1): Pt will demonstrate self-monitoring verbal and functional errors with mod A verbal cues. SLP Short Term Goal 6 - Progress (Week 1): Met  Skilled Therapeutic Interventions:   Patient seen with daughter present in room for skilled ST session focusing on cognitive-linguistic goals. She reported that she has been feeling feeling better lately (has been having some BP issues). Patient also has her more current prescription eyeglasses on today. SLP introduced sentence-long math word problem which involved determining year someone was born when given age and current year. She was able read sentence and with modA verbal, modeling cues determined what operation (subtraction) she needed to use to  solve. When SLP setup math problem, she required maxA cues to solve. She performed best with one and two digit subtraction or addition problems. Patient was a bit frustrated by how difficult this was for her and she stated she would work on it. SLP informed patient that math computations is not specifically going to be targeted in ST sessions but determining how to solve functional problems will be (some involving numbers such as financial). Patient then completed short paragraph level reading comprehension task and was able to answer recall based questions with 90% accuracy and min-mod fading to min A semantic cues. Patient left in bed with daughter present in room and all needs within reach. She continues to benefit from skilled SLP intervention to maximize cognitive-linguistic function prior to discharge.  Pain Pain Assessment Pain Scale: 0-10 Pain Score: 0-No pain  Therapy/Group: Individual Therapy   Sonia Baller, MA, CCC-SLP Speech Therapy

## 2020-12-23 NOTE — Progress Notes (Signed)
Discussed with Dr. Dagoberto Ligas on patient's low blood pressure this morning.  New orders to hold cozaar and increase midodrine to 10mg .  Patient appears asymptomatic. Will continue to monitor.

## 2020-12-23 NOTE — Progress Notes (Signed)
Occupational Therapy Session Note  Patient Details  Name: Felicia Chen MRN: [id number] Date of Birth: [birth year]  Today's Date: 12/23/2020 OT Individual Time: 5573-2202 OT Individual Time Calculation (min): 80 min    Short Term Goals: Week 3:  OT Short Term Goal 1 (Week 3): Pt will perform sit <> stands at [place] with Min A in prep for ADL OT Short Term Goal 2 (Week 3): Pt will perform squat pivot transfer to BSC/toilet with Min A OT Short Term Goal 3 (Week 3): Pt will perform LB dressing with Mod A OT Short Term Goal 4 (Week 3): Pt will improve awareness attending to RUE during ADL tasks in 3 consecutive sessions  Skilled Therapeutic Interventions/Progress Updates:  Patient met lying supine in bed in agreement with OT treatment session. 0/10 pain reported at rest and with activity. Patient continues to be limited by symptomatic hypotension with SPB ranging from 542-706 and diastolic BP ranging from 23-76 at rest and with activity this session. Session with focus on self-care re-education, NMR and therapeutic activity. Supine to EOB with Min to light Mod A to elevate trunk with heavy use of grab bar. Patient able to scoot anteriorly at EOB in prep for sit to stand in stedy without external assist. Cues for R attention. Patient completing sit to stands in stedy with light Min A from EOB and from tub bench in walk-in shower. Bathing at shower level with thigh high TED hose in place for BP control. Patient reporting onset of dizziness at conclusion of shower. Returned to supine with assist at RLE and CGA at trunk to control descent. Bed momentarily placed in reverse trendelenburg. Total A to doff wet TEDs and don new pair. After dizziness resolved, patient able to thread LLE through underwear and brief seated EOB with Min guard for dynamic sitting balance. Total A to thread RLE. Max A +2 to hike pants over hip standing in stedy. Max A to don shirt seated EOB with cues for hemi technique. Patient  with continued deficits in organization, motor planning, attention and sequencing. Able to maintain static sitting balance for extended period of time at EOB as therapist assisted with de-tangling hair. Noted patient with improved fluency of speech and ability to make needs known during ADL tasks this session. Session concluded with patient lying supine with staff present for in-and-out cath.  Therapy Documentation Precautions:  Precautions Precautions: Fall Precaution Comments: R hemi, inattention. SBP parameters <160 per neuro on acute Restrictions Weight Bearing Restrictions: No General:    Therapy/Group: Individual Therapy  Herta Hink R [place] 12/23/2020, 6:55 AM

## 2020-12-23 NOTE — Progress Notes (Signed)
[place] PHYSICAL MEDICINE & REHABILITATION PROGRESS NOTE  Subjective/Complaints:  Pt reports didn't feel bad when BP 85/50 this AM, but was laying down.  Did have episode of aching muscles and joints when up in w/c too long yesterday.  Yesterday was good BP day.   Spoke with nurse and pt about low BP- will stop Losartan and increase Midodrine- will goal of starting Flomax- since still not peeing.   ROS:   Pt denies SOB, abd pain, CP, N/V/C/D, and vision changes   Objective: Vital Signs: Blood pressure (!) 85/50, pulse 75, temperature 98.9 F (37.2 C), temperature source Oral, resp. rate 18, height 5' 7.99" (1.727 m), weight 104.3 kg, SpO2 99 %. No results found. Recent Labs    12/21/20 0503  WBC 7.4  HGB 13.7  HCT 40.4  PLT 258     Recent Labs    12/21/20 0503  NA 136  K 3.8  CL 103  CO2 25  GLUCOSE 101*  BUN 15  CREATININE 0.73  CALCIUM 9.2      Intake/Output Summary (Last 24 hours) at 12/23/2020 0914 Last data filed at 12/23/2020 0549 Gross per 24 hour  Intake 320 ml  Output 1300 ml  Net -980 ml        Physical Exam: BP (!) 85/50 (BP Location: Right Arm)   Pulse 75   Temp 98.9 F (37.2 C) (Oral)   Resp 18   Ht 5' 7.99" (1.727 m)   Wt 104.3 kg   SpO2 99%   BMI 34.97 kg/m       General: awake, alert, appropriate, laying in bed; daughter at bedside; NAD HENT: conjugate gaze; oropharynx moist CV: regular rate; no JVD Pulmonary: CTA B/L; no W/R/R- good air movement GI: soft, NT, ND, (+)BS Psychiatric: appropriate Neurological: alert- aphasia is better- at sentence level- discussed working on aphasia word games with family Hoffman's RUE and few beats clonus RLE R wrist splint in place;  Extremities: No clubbing, cyanosis, or edema Skin: No evidence of breakdown, no evidence of rash A and O x 3   Tone with clonus RIght ankle - few beats with Hoffman's on RUE She does some difficulty differentiating right from left. Motor: RUE/RLE: 0/5  proximal distal- RUE 0/5 still- splints off.  Sensation diminished to light touch RUE/RLE   Assessment/Plan: 1. Functional deficits which require 3+ hours per day of interdisciplinary therapy in a comprehensive inpatient rehab setting. Physiatrist is providing close team supervision and 24 hour management of active medical problems listed below. Physiatrist and rehab team continue to assess barriers to discharge/monitor patient progress toward functional and medical goals   Care Tool:  Bathing    Body parts bathed by patient: Right arm, Chest, Abdomen, Right upper leg, Left upper leg, Face   Body parts bathed by helper: Left arm, Front perineal area, Buttocks, Right lower leg, Left lower leg     Bathing assist Assist Level: Total Assistance - Patient < 25%     Upper Body Dressing/Undressing Upper body dressing   What is the patient wearing?: Pull over shirt    Upper body assist Assist Level: Maximal Assistance - Patient 25 - 49%    Lower Body Dressing/Undressing Lower body dressing      What is the patient wearing?: Pants     Lower body assist Assist for lower body dressing: Maximal Assistance - Patient 25 - 49%     Toileting Toileting    Toileting assist Assist for toileting: 2 Helpers  Transfers Chair/bed transfer  Transfers assist     Chair/bed transfer assist level: Moderate Assistance - Patient 50 - 74%     Locomotion Ambulation   Ambulation assist   Ambulation activity did not occur: Safety/medical concerns  Assist level: Maximal Assistance - Patient 25 - 49% Assistive device:  (hall rail) Max distance: 10   Walk 10 feet activity   Assist  Walk 10 feet activity did not occur: Safety/medical concerns  Assist level: Maximal Assistance - Patient 25 - 49% Assistive device:  (hall rail)   Walk 50 feet activity   Assist Walk 50 feet with 2 turns activity did not occur: Safety/medical concerns         Walk 150 feet  activity   Assist Walk 150 feet activity did not occur: Safety/medical concerns         Walk 10 feet on uneven surface  activity   Assist Walk 10 feet on uneven surfaces activity did not occur: Safety/medical concerns         Wheelchair     Assist Is the patient using a wheelchair?: Yes Type of Wheelchair: Manual Wheelchair activity did not occur: Safety/medical concerns  Wheelchair assist level: Dependent - Patient 0%      Wheelchair 50 feet with 2 turns activity    Assist    Wheelchair 50 feet with 2 turns activity did not occur: Safety/medical concerns       Wheelchair 150 feet activity     Assist  Wheelchair 150 feet activity did not occur: Safety/medical concerns        Medical Problem List and Plan: 1.  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  Impaired mobility -DVT/anticoagulation:  Pharmaceutical: Continue Heparin  10/6- will change to Lovenox             -antiplatelet therapy: N/A 3. Pain Management: As needed meds 4. Anxiety/depression: Continue Xanax 0.5 mg 3 times daily, BuSpar 5 mg twice daily increase to TID , hydroxyzine 25 mg 4 times daily- d/c atarax due to urinary retention  9/29- will start Celexa 20 mg daily for severe new onset depression. 10/5- mood much better- con't regimen             -antipsychotic agents: N/A 5. Neuropsych: This patient is not fully capable of making decisions on her own behalf. 6. Skin/Wound Care: Routine skin checks 7. Fluids/Electrolytes/Nutrition: Routine in and outs 8.  Hypertension.  Continue Norvasc 5 mg daily, Cozaar 50 mg twice daily.               Monitor with increased mobility Vitals:   12/22/20 2026 12/23/20 0553  BP: (!) 98/53 (!) 85/50  Pulse: 76 75  Resp: 18 18  Temp: 98.3 F (36.8 C) 98.9 F (37.2  C)  SpO2: 94% 99%    9/30- will con't Abd binder/TEDS/ACE wraps- and monitor- looks a little better this AM 100/58 10/1- reduce cozaar to 25mg  qd, amlodipine to 2.5 mgwould reduce BP meds before starting midodrine or increasing florinef  10/2- will con't fLORINEF AND ADD mIDRODRINE 5 MG tid WITH MEALS/FIRST DOSE 0160FU.   10/3- started Midodrine since BP was so low yesterday- 60s/40s- will reduce Norvasc to 2.5 mg daily.  Con't Losartan 25 mg BID for now.   10/4- will stop Norvasc completely; increase  Florinef to 0.2 mg daily with red MEWS 61/42 yesterday and 90s/50s this AM- and give 0.1 mg x1 of Florinef to increase dose for today- also left note for therapy to use TEDS and ACE wraps.   10/5- best day for BP yesterday- will give Amantadine and monitor BP closely- might need to reduce Losartan?  10/6- stop Losartan and increase midodrine to 10 mg TID-  9.  Hyperlipidemia: Lipitor 10.  COVID-19.  Completed course of remdesivir.  Currently completing course of dexamethasone 11.  Obesity.  BMI 39.52.  Dietary follow-up.  Encourage weight loss  9/29- BMI 34.97- improving.  12.  Leukocytosis-likely secondary to steroids             Afebrile, no signs/symptoms of infection  9/27- no labs yesterday- will check in AM  9/28- WBC down to 11k- con't to monitor weekly.  13.  Transaminitis  ALT elevated on 9/21, continue to monitor  9/27- will recheck CMP in AM  9/29- LFTs 21/60- doing better- con't to monitor  10/3- will recheck labs in AM 14. Orthostatic hypotension: continue TEDs, added abdominal binder in case this is not enough. Improved  reduce cozaar to 25mg  daily , amlodipine to 2.5mg  15.  Urinary retention add urecholine increase to 10mg  QID , now off anticholinergic agents , BP too low for flomax also with orthostasis   10/5- still cannot add Flomax- BP still too low- not voiding- but cathing q6-8 hours-  16. Nausea  9/27- will schedule Zofran 4 mg at 6am daily to help with nausea and  monitor   9/28- doing better- con't regimen.  17. 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  18. Aphasia/poor motor planning  10/5- adding Amantadine 100 mg daily-   I spent a total of 35 minutes on visit- d/w nursing and pt and daughter about care- >50% on coordination of care.   LOS: 16 days A FACE TO FACE EVALUATION WAS PERFORMED  Lido Maske 12/23/2020, 9:14 AM

## 2020-12-23 NOTE — Progress Notes (Addendum)
Patient ID: Felicia Chen, female   DOB: 1965-06-11, 55 y.o.   MRN: [id number] Informed by insurance update pt's [employer] is trying to deny her coverage and feels by tomorrow 10/7 she can be discharged. Have contacted [facility] and awaiting a return call for MD pt od a peer to peer. Pt may be coded wrong according to [facility] pt is here for weakness. Text MD awaiting number from [employer]  9:30 AM Have contacted the peer to peer line and have requested MDwho will be DR. Audrie Lia will be calling our MD anytime between 8:00-4:30 pm today. [employer] feels pt can be managed at a lower level of care. MD aware and will expect a call. Do not want to let pt know due to it will upset her, will see result of this first before informing pt and family.  9:55 AM MD reports pt has been approved, she spoke with Medical Director at [facility]. Pt will probably not be able to be extended though. Will work toward 10/19

## 2020-12-23 NOTE — Progress Notes (Signed)
Physical Therapy Weekly Progress Note  Patient Details  Name: Felicia Chen MRN: [id number] Date of Birth: [birth year]  Beginning of progress report period: December 15, 2020 End of progress report period: December 23, 2020  Today's Date: 12/23/2020 PT Individual Time: 0900-1000 PT Individual Time Calculation (min): 60 min   Patient has met 3 of 3 short term goals.  Pt is min A consistently with bed mobility. Squat pivot transfers with mod A consistently to both sides, min A at times. Gait up to 35 ft with max A and hall way rail. Pt severely limited by hypotension until several days prior to this report, but demonstrates marked improvement over past several sessions.  Patient continues to demonstrate the following deficits muscle weakness, decreased cardiorespiratoy endurance, impaired timing and sequencing, abnormal tone, unbalanced muscle activation, decreased coordination, and decreased motor planning, decreased safety awareness, decreased memory, and delayed processing, and hemiplegia and therefore will continue to benefit from skilled PT intervention to increase functional independence with mobility.  Patient progressing toward long term goals..  Continue plan of care.  PT Short Term Goals Week 1:  PT Short Term Goal 1 (Week 1): Pt will initiate gait training PT Short Term Goal 1 - Progress (Week 1): Met PT Short Term Goal 2 (Week 1): Pt will perform STS with mod A of 1 PT Short Term Goal 2 - Progress (Week 1): Met PT Short Term Goal 3 (Week 1): Pt will tolerate OOB sitting between therapy sessions PT Short Term Goal 3 - Progress (Week 1): Met Week 2:  PT Short Term Goal 1 (Week 2): Pt will demonstrate STS with RW and mod A PT Short Term Goal 1 - Progress (Week 2): Met PT Short Term Goal 2 (Week 2): Pt's will perform standing mobility x 2 min without orthostatic symptoms. PT Short Term Goal 2 - Progress (Week 2): Met PT Short Term Goal 3 (Week 2): Pt will perform gait with max  A x 10 ft PT Short Term Goal 3 - Progress (Week 2): Met PT Short Term Goal 4 (Week 2): Pt will demo standing activity x 5 min wqithout orthostatic sx. PT Short Term Goal 5 (Week 2): Pt will tolerate sitting in manual w/c x 1 hour  Skilled Therapeutic Interventions/Progress Updates:    pt received in bed and agreeable to therapy. Pt daughter at bedside. [place] Bing, RT, present throughout session for co treat focusing on coping skills during functional mobility.   Therapeutic Activity Transfers: Supine<>sit with min A, Pt demoed active motion of RLE while lifting LE onto bed. Pt unable to conquer gravity but controlled leg onto bed with min A.  Squat pivot transfers R and L with mod A, one instance of min A with pt moving before therapist was in position to assist requiring multiple scoots to get centered in [place] chair.  Lattie Haw provided encouragement and education on coping skills throughout. Pt demoed improved self efficacy and acknowledged successes with min cueing.  Gait training:  Gait x 20 ft and x 12 ft with hall way rail and max A to advance RLE. DF ace wrap applied tot A. Cues for shorter steps with LLE and to slow down and take deep breaths to manage anxiety. Pt required only short rest break to regroup between bouts.   NMR: Pt participated in standing cornhole x 2 bouts with max A for RLE extension while standing, max x 2 for balance fading to mod A of 1. Pt demoes posterior lean in standing, able  to correct with cueing to use head to shift weight forward. Pt demoed delayed balance reactions, needing no more than mod A to maintain balance while reaching for beanbags and tossing them.   Pt returned to bed after session and was left with all needs in reach and alarm active.   Therapy Documentation Precautions:  Precautions Precautions: Fall Precaution Comments: R hemi, inattention. SBP parameters <160 per neuro on acute Restrictions Weight Bearing Restrictions:  No    Therapy/Group: Individual Therapy  Mickel Fuchs 12/23/2020, 1:34 PM

## 2020-12-23 NOTE — Progress Notes (Signed)
Recreational Therapy Session Note  Patient Details  Name: MIKELE SIFUENTES MRN: [id number] Date of Birth: 1966/02/21 Today's Date: 12/23/2020  Pain: no c/o Skilled Therapeutic Interventions/Progress Updates: Session focused on incorporating coping/relaxation strategies into mobility and leisure tasks.  Pt is very motivated to improve and demonstrates increased tension and anxiety with activity.  Pt ambulated with +2 assist (+2 for w/c follow) with max assist and mod instructional cues to walk 20" and 12 using handrail.  LRT encouraged deep breathing throughout ambulation for safer gait and to reduce anxiety with mobility.  During seated rest breaks encouraged deep breathing in preparation for the next task.  Pt needed mod fading to min cues for utilization of relaxation strategies.  Transitioned to cornhole with pt standing with max assist of 2 for safety.  Pt with posterior lean with activity needing verbal and tactile cues to recover.  Pt progressed to needing only mod assist of 1 for 2nd round of cornhole.  Pt voiced a sense of accomplishment at the end of the session.  Pt verbalized that she was going to return to the room for exercise in bed with her daughter.  Encouraged pt to allow her body and brain time to rest and recover before doing additional exercise.  Pt agreeable.  Therapy/Group: Co-Treatment  Trexton Escamilla 12/23/2020, 4:31 PM

## 2020-12-24 MED ORDER — FLUTICASONE PROPIONATE 50 MCG/ACT NA SUSP
1.0000 | Freq: Every day | NASAL | Status: DC
  Administered 2020-12-24 – 2020-12-28 (×2): 1 via NASAL
  Filled 2020-12-24: qty 16

## 2020-12-24 NOTE — Progress Notes (Signed)
Occupational Therapy Session Note  Patient Details  Name: NATASHA BURDA MRN: [id number] Date of Birth: Jun 17, 1965  Today's Date: 12/24/2020 OT Individual Time: 1001-1057 OT Individual Time Calculation (min): 56 min    Short Term Goals: Week 3:  OT Short Term Goal 1 (Week 3): Pt will perform sit <> stands at [place] with Min A in prep for ADL OT Short Term Goal 2 (Week 3): Pt will perform squat pivot transfer to BSC/toilet with Min A OT Short Term Goal 3 (Week 3): Pt will perform LB dressing with Mod A OT Short Term Goal 4 (Week 3): Pt will improve awareness attending to RUE during ADL tasks in 3 consecutive sessions  Skilled Therapeutic Interventions/Progress Updates:  Patient met seated in TIS wc in agreement with OT treatment session. 0/10 pain reported at rest and with activity. Total A for wc transport to bathroom in prep for toilet transfer. Patient able to scoot anteriorly in TIS wc in prep for squat-pivot to drop-arm BSC over standard height toilet. Mod A required for transfer with assist to manage RLE. Patient attending to RUE with Min mutlimodal cues. Mod A +2 for hygiene/clothing management in sitting/standing with R knee block and assist to maintain position of RUE on RW. In all patient stood for ~3 minutes. Total A for wc transport to dayroom in prep for NMR. Blocked practice for sit to stand transfers with visual feedback from standing mirror in same manner as indicated above. Patient then returned to hospital room with Total A. Daughter present in hospital room upon return. Able to verbalize tasks completed with this writer during session without difficulty. Session concluded with patient seated in TIS wc with call bell within reach, belt alarm activated and all needs met.   Therapy Documentation Precautions:  Precautions Precautions: Fall Precaution Comments: R hemi, inattention. SBP parameters <160 per neuro on acute Restrictions Weight Bearing Restrictions: No General:    Therapy/Group: Individual Therapy  Marleen Moret R [place] 12/24/2020, 9:53 AM

## 2020-12-24 NOTE — Progress Notes (Signed)
Physical Therapy Session Note  Patient Details  Name: Felicia Chen MRN: [id number] Date of Birth: 1965-08-18  Today's Date: 12/24/2020 PT Individual Time: 0800-0915, 1130-1203 PT Individual Time Calculation (min): 75 min, 33 min  Short Term Goals: Week 1:  PT Short Term Goal 1 (Week 1): Pt will initiate gait training PT Short Term Goal 1 - Progress (Week 1): Met PT Short Term Goal 2 (Week 1): Pt will perform STS with mod A of 1 PT Short Term Goal 2 - Progress (Week 1): Met PT Short Term Goal 3 (Week 1): Pt will tolerate OOB sitting between therapy sessions PT Short Term Goal 3 - Progress (Week 1): Met Week 2:  PT Short Term Goal 1 (Week 2): Pt will demonstrate STS with RW and mod A PT Short Term Goal 1 - Progress (Week 2): Met PT Short Term Goal 2 (Week 2): Pt's will perform standing mobility x 2 min without orthostatic symptoms. PT Short Term Goal 2 - Progress (Week 2): Met PT Short Term Goal 3 (Week 2): Pt will perform gait with max A x 10 ft PT Short Term Goal 3 - Progress (Week 2): Met PT Short Term Goal 4 (Week 2): Pt will demo standing activity x 5 min wqithout orthostatic sx. PT Short Term Goal 5 (Week 2): Pt will tolerate sitting in manual w/c x 1 hour  Skilled Therapeutic Interventions/Progress Updates:    Session 1: pt received in bed and agreeable to therapy. No complaint of pain. Pt in good spirits this morning with her daughter present. Supine>sit with min A and bed features. Squat pivot EOB>w/c>mat table>w/c with mod A to both R and L sides.  Pt directed in Sit to stand x 5 and x 10 with mod A assist for R knee block and hip extension, +2 present providing HHA, fading to CGA. Pt demoed improved standing balance and postural control. Sit>supine with min A for RLE management. Rolling R and L on mat table with supervision and VC. Pt participated in active assisted exercise in L sidelying with powder board including: hip flexion/extension, knee flexion and extension,  marching (triple extension/flexion). Pt demoes active motion into hip flexion and knee extension without assist but requires max to total for hip extension and knee flexion. Noted ant hip tightness, addressed with passive stretch. Noted mild spastic response with hip extension. Visual feedback, max VC, and tapping facilitation utilized throughout. Supine>sit with max assist and VC d/t new task of sitting up from flat surface. Pt became emotional and tearful after becoming frustrated while trying to sit. Pt utilized deep breathing and visualization for holistic stress management. Pt returned to room after session and remained in [place]. Pt was left with all needs in reach and alarm active.   Session 2: Pt seated in TIS with her daughter present. No complaint of pain. Pt transported to therapy gym for time management and energy conservation. Session focused on high intensity gait training. Pt ambulated 3 x 30 ft with hallway rail. DF assist ace wrap donned with tot A and max for advancement of RLE. Cues throughout for appropriate step length and sequencing. Pt demoed improved stability on RLE, with less ER noted during gait. Pt returned to bed afte session with mod A squat pivot to R side, min A sit>supine and min scooting toward HOB. Pt was left with all needs in reach and alarm active.   Therapy Documentation Precautions:  Precautions Precautions: Fall Precaution Comments: R hemi, inattention. SBP parameters <160 per neuro  on acute Restrictions Weight Bearing Restrictions: No     Therapy/Group: Individual Therapy  Mickel Fuchs 12/24/2020, 12:19 PM

## 2020-12-24 NOTE — Progress Notes (Signed)
Speech Language Pathology Daily Session Note  Patient Details  Name: Felicia Chen MRN: [id number] Date of Birth: 1965/04/10  Today's Date: 12/24/2020 SLP Individual Time: 1445-1530 SLP Individual Time Calculation (min): 45 min  Short Term Goals: Week 3: SLP Short Term Goal 1 (Week 3): Pt will demonstrate reading comprehenison at the sentence level with 80% accuracy and supervision A verbal cues. SLP Short Term Goal 2 (Week 3): Pt will complete mildly complex tasks with mod A verbal cues. SLP Short Term Goal 3 (Week 3): Pt will self-monitor and self-correct functional and functional/verbal errors with min A verbal cues. SLP Short Term Goal 4 (Week 3): Pt will demonstrate word finding skills in higher level tasks (ex: describing, convergent/divergent naming) with min A verbal cues. SLP Short Term Goal 5 (Week 3): Pt will demonstrate recall of daily, novel information/events with supervision A verbal cues for compensatory strategies (written aids, memory notebooks).  Skilled Therapeutic Interventions:   Patient seen for skilled ST session focusing on language goals. Patient reported that she was happy because she was able to do more with PT and OT today (has been having BP issues which has limited her physical activity). Patient was very prompt and accurate when naming items in a given category and she only required minA verbal cues when she was starting to 'overthink' and get disorganized and unable to maintain focus on thoughts/topic. She was able to describe specific therapeutic activities completed with PT and OT with SLP providing minA verbal cues. Verbal fluency during semi structured conversations improved with less frequent hesitations, and patient requiring only supervision to minA verbal cues.  Patient was left in bed with all needs within reach. She continues to demonstrate steady progress with language and cognitive function and continues to benefit from skilled SLP intervention to  maximize cognitive and language function prior to discharge.  Pain Pain Assessment Pain Scale: 0-10 Pain Score: 0-No pain  Therapy/Group: Individual Therapy   Sonia Baller, MA, CCC-SLP Speech Therapy

## 2020-12-24 NOTE — Progress Notes (Signed)
[facility] PHYSICAL MEDICINE & REHABILITATION PROGRESS NOTE  Subjective/Complaints:   Pt reports she's had the first "normal" day of therapy yesterday.  Does have sore throat- Slept poorly.  Michela Pitcher might have allergies, but didn't think she did- but is having post nasal drip.    ROS:   Pt denies SOB, abd pain, CP, N/V/C/D, and vision changes  Objective: Vital Signs: Blood pressure 102/62, pulse 75, temperature 98.8 F (37.1 C), temperature source Oral, resp. rate 16, height 5' 7.99" (1.727 m), weight 104.3 kg, SpO2 95 %. No results found. No results for input(s): WBC, HGB, HCT, PLT in the last 72 hours.    No results for input(s): NA, K, CL, CO2, GLUCOSE, BUN, CREATININE, CALCIUM in the last 72 hours.     Intake/Output Summary (Last 24 hours) at 12/24/2020 1057 Last data filed at 12/24/2020 0430 Gross per 24 hour  Intake 120 ml  Output 1050 ml  Net -930 ml        Physical Exam: BP 102/62 (BP Location: Left Arm)   Pulse 75   Temp 98.8 F (37.1 C) (Oral)   Resp 16   Ht 5' 7.99" (1.727 m)   Wt 104.3 kg   SpO2 95%   BMI 34.97 kg/m        General: awake, alert, appropriate, sitting up in bed slightly; NAD HENT: conjugate gaze; oropharynx moist; sniffy/stuffy- oropharynx and uvula moderate erythema.  CV: regular rate; no JVD Pulmonary: CTA B/L; no W/R/R- good air movement GI: soft, NT, ND, (+)BS Psychiatric: appropriate- laying in bed with daughter at bedside Neurological: alert- aphasia is better- at sentence level- discussed working on aphasia word games with family Hoffman's RUE and few beats clonus RLE R wrist splint in place;  Extremities: No clubbing, cyanosis, or edema Skin: No evidence of breakdown, no evidence of rash A and O x 3   Tone with clonus RIght ankle - few beats with Hoffman's on RUE She does some difficulty differentiating right from left. Motor: RUE/RLE: 0/5 proximal distal- RUE 0/5 still- splints off.  Sensation diminished to light  touch RUE/RLE   Assessment/Plan: 1. Functional deficits which require 3+ hours per day of interdisciplinary therapy in a comprehensive inpatient rehab setting. Physiatrist is providing close team supervision and 24 hour management of active medical problems listed below. Physiatrist and rehab team continue to assess barriers to discharge/monitor patient progress toward functional and medical goals   Care Tool:  Bathing    Body parts bathed by patient: Right arm, Chest, Abdomen, Right upper leg, Left upper leg, Face   Body parts bathed by helper: Left arm, Front perineal area, Buttocks, Right lower leg, Left lower leg     Bathing assist Assist Level: Maximal Assistance - Patient 24 - 49%     Upper Body Dressing/Undressing Upper body dressing   What is the patient wearing?: Pull over shirt    Upper body assist Assist Level: Maximal Assistance - Patient 25 - 49%    Lower Body Dressing/Undressing Lower body dressing      What is the patient wearing?: Pants, Underwear/pull up     Lower body assist Assist for lower body dressing: Maximal Assistance - Patient 25 - 49%     Toileting Toileting    Toileting assist Assist for toileting: 2 Helpers     Transfers Chair/bed transfer  Transfers assist     Chair/bed transfer assist level: Moderate Assistance - Patient 50 - 74%     Locomotion Ambulation   Ambulation assist  Ambulation activity did not occur: Safety/medical concerns  Assist level: Maximal Assistance - Patient 25 - 49% Assistive device:  (hall rail) Max distance: 10   Walk 10 feet activity   Assist  Walk 10 feet activity did not occur: Safety/medical concerns  Assist level: Maximal Assistance - Patient 25 - 49% Assistive device:  (hall rail)   Walk 50 feet activity   Assist Walk 50 feet with 2 turns activity did not occur: Safety/medical concerns         Walk 150 feet activity   Assist Walk 150 feet activity did not occur:  Safety/medical concerns         Walk 10 feet on uneven surface  activity   Assist Walk 10 feet on uneven surfaces activity did not occur: Safety/medical concerns         Wheelchair     Assist Is the patient using a wheelchair?: Yes Type of Wheelchair: Manual Wheelchair activity did not occur: Safety/medical concerns  Wheelchair assist level: Dependent - Patient 0%      Wheelchair 50 feet with 2 turns activity    Assist    Wheelchair 50 feet with 2 turns activity did not occur: Safety/medical concerns       Wheelchair 150 feet activity     Assist  Wheelchair 150 feet activity did not occur: Safety/medical concerns        Medical Problem List and Plan: 1.  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  Impaired mobility -DVT/anticoagulation:  Pharmaceutical: Continue Heparin  10/6- will change to Lovenox             -antiplatelet therapy: N/A 3. Pain Management: As needed meds 4. Anxiety/depression: Continue Xanax 0.5 mg 3 times daily, BuSpar 5 mg twice daily increase to TID , hydroxyzine 25 mg 4 times daily- d/c atarax due to urinary retention  9/29- will start Celexa 20 mg daily for severe new onset depression. 10/5- mood much better- con't regimen             -antipsychotic agents: N/A 5. Neuropsych: This patient is not fully capable of making decisions on her own behalf. 6. Skin/Wound Care: Routine skin checks 7. Fluids/Electrolytes/Nutrition: Routine in and outs 8.  Hypertension.  Continue Norvasc 5 mg daily, Cozaar 50 mg twice daily.               Monitor with increased mobility Vitals:   12/23/20 1953 12/24/20 0520  BP: 106/62 102/62  Pulse: 69 75  Resp: 19 16  Temp: 98.2 F (36.8 C) 98.8 F (37.1 C)  SpO2: 95% 95%    9/30- will con't Abd  binder/TEDS/ACE wraps- and monitor- looks a little better this AM 100/58 10/1- reduce cozaar to 25mg  qd, amlodipine to 2.5 mgwould reduce BP meds before starting midodrine or increasing florinef  10/2- will con't fLORINEF AND ADD mIDRODRINE 5 MG tid WITH MEALS/FIRST DOSE [id number].   10/3- started Midodrine since BP was so low yesterday- 60s/40s- will reduce Norvasc to 2.5 mg daily.  Con't Losartan 25 mg BID for now.   10/4- will stop Norvasc completely; increase Florinef to 0.2 mg daily with red MEWS 61/42 yesterday and 90s/50s this AM- and give 0.1 mg x1 of Florinef to increase dose for today- also left note for therapy to use TEDS and  ACE wraps.   10/5- best day for BP yesterday- will give Amantadine and monitor BP closely- might need to reduce Losartan?  10/6- stop Losartan and increase midodrine to 10 mg TID-   10/7- BP 100s/60s- will still wait til BP comes up more- for adding Flomax.  9.  Hyperlipidemia: Lipitor 10.  COVID-19.  Completed course of remdesivir.  Currently completing course of dexamethasone 11.  Obesity.  BMI 39.52.  Dietary follow-up.  Encourage weight loss  9/29- BMI 34.97- improving.  12.  Leukocytosis-likely secondary to steroids             Afebrile, no signs/symptoms of infection  9/27- no labs yesterday- will check in AM  9/28- WBC down to 11k- con't to monitor weekly.  13.  Transaminitis  ALT elevated on 9/21, continue to monitor  9/27- will recheck CMP in AM  9/29- LFTs 21/60- doing better- con't to monitor  10/7- resolved 14. Orthostatic hypotension: continue TEDs, added abdominal binder in case this is not enough. Improved  reduce cozaar to 25mg  daily , amlodipine to 2.5mg  15.  Urinary retention add urecholine increase to 10mg  QID , now off anticholinergic agents , BP too low for flomax also with orthostasis   10/5- still cannot add Flomax- BP still too low- not voiding- but cathing q6-8 hours-   10/7- will still wait on Flomax since BP low 100s/60s-  16.  Nausea  9/27- will schedule Zofran 4 mg at 6am daily to help with nausea and monitor   9/28- doing better- con't regimen.  17. 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  18. Aphasia/poor motor planning  10/5- adding Amantadine 100 mg daily-   10/7- appears to be doing better with aphasia- will d/w therapy if motor planning is any better? 19 Post nasal drip  10/7- will add Flonase for sore throat/post nasal drip- if need be, might need Claritin.    LOS: 17 days A FACE TO FACE EVALUATION WAS PERFORMED  Danie Diehl 12/24/2020, 10:57 AM

## 2020-12-25 DIAGNOSIS — I959 Hypotension, unspecified: Secondary | ICD-10-CM

## 2020-12-25 DIAGNOSIS — I69320 Aphasia following cerebral infarction: Secondary | ICD-10-CM

## 2020-12-25 MED ORDER — POLYETHYLENE GLYCOL 3350 17 G PO PACK
17.0000 g | PACK | Freq: Every day | ORAL | Status: DC
  Administered 2020-12-25 – 2021-01-05 (×7): 17 g via ORAL
  Filled 2020-12-25 (×9): qty 1

## 2020-12-25 NOTE — Progress Notes (Signed)
[facility] PHYSICAL MEDICINE & REHABILITATION PROGRESS NOTE  Subjective/Complaints:   Happy that nausea is better. Had another good day of therapy. I actually saw her up with PT and she was doing great. Daughters with her this morning  ROS: Patient denies fever, rash, sore throat, blurred vision, nausea, vomiting, diarrhea, cough, shortness of breath or chest pain, joint or back pain, headache, or mood change.   Objective: Vital Signs: Blood pressure (!) 97/57, pulse 74, temperature 97.6 F (36.4 C), temperature source Oral, resp. rate 18, height 5' 7.99" (1.727 m), weight 104.3 kg, SpO2 93 %. No results found. No results for input(s): WBC, HGB, HCT, PLT in the last 72 hours.    No results for input(s): NA, K, CL, CO2, GLUCOSE, BUN, CREATININE, CALCIUM in the last 72 hours.     Intake/Output Summary (Last 24 hours) at 12/25/2020 0943 Last data filed at 12/24/2020 1821 Gross per 24 hour  Intake 480 ml  Output 300 ml  Net 180 ml        Physical Exam: BP (!) 97/57 (BP Location: Left Arm)   Pulse 74   Temp 97.6 F (36.4 C) (Oral)   Resp 18   Ht 5' 7.99" (1.727 m)   Wt 104.3 kg   SpO2 93%   BMI 34.97 kg/m        Constitutional: No distress . Vital signs reviewed. HEENT: NCAT, EOMI, oral membranes moist Neck: supple Cardiovascular: RRR without murmur. No JVD    Respiratory/Chest: CTA Bilaterally without wheezes or rales. Normal effort    GI/Abdomen: BS +, non-tender, non-distended Ext: no clubbing, cyanosis, or edema Psych: pleasant and cooperative  Neurological: alert- improving with word findings Hoffman's RUE and few beats clonus RLE remain present  Extremities: No clubbing, cyanosis, or edema Skin: No evidence of breakdown, no evidence of rash A and O x 3   Tone with clonus RIght ankle - few beats with Hoffman's on RUE She does some difficulty differentiating right from left. Motor: RLE: 1-2/5 proximal 0/5 distal- RUE 1/5 with early flexor patterns  developing Sensation diminished to light touch RUE/RLE   Assessment/Plan: 1. Functional deficits which require 3+ hours per day of interdisciplinary therapy in a comprehensive inpatient rehab setting. Physiatrist is providing close team supervision and 24 hour management of active medical problems listed below. Physiatrist and rehab team continue to assess barriers to discharge/monitor patient progress toward functional and medical goals   Care Tool:  Bathing    Body parts bathed by patient: Right arm, Chest, Abdomen, Right upper leg, Left upper leg, Face   Body parts bathed by helper: Left arm, Front perineal area, Buttocks, Right lower leg, Left lower leg     Bathing assist Assist Level: Maximal Assistance - Patient 24 - 49%     Upper Body Dressing/Undressing Upper body dressing   What is the patient wearing?: Pull over shirt    Upper body assist Assist Level: Maximal Assistance - Patient 25 - 49%    Lower Body Dressing/Undressing Lower body dressing      What is the patient wearing?: Pants, Underwear/pull up     Lower body assist Assist for lower body dressing: Maximal Assistance - Patient 25 - 49%     Toileting Toileting    Toileting assist Assist for toileting: 2 Helpers     Transfers Chair/bed transfer  Transfers assist     Chair/bed transfer assist level: Moderate Assistance - Patient 50 - 74% (via squat-pivot)     Locomotion Ambulation  Ambulation assist   Ambulation activity did not occur: Safety/medical concerns  Assist level: Maximal Assistance - Patient 25 - 49% Assistive device:  (hall rail) Max distance: 10   Walk 10 feet activity   Assist  Walk 10 feet activity did not occur: Safety/medical concerns  Assist level: Maximal Assistance - Patient 25 - 49% Assistive device:  (hall rail)   Walk 50 feet activity   Assist Walk 50 feet with 2 turns activity did not occur: Safety/medical concerns         Walk 150 feet  activity   Assist Walk 150 feet activity did not occur: Safety/medical concerns         Walk 10 feet on uneven surface  activity   Assist Walk 10 feet on uneven surfaces activity did not occur: Safety/medical concerns         Wheelchair     Assist Is the patient using a wheelchair?: Yes Type of Wheelchair: Manual Wheelchair activity did not occur: Safety/medical concerns  Wheelchair assist level: Dependent - Patient 0%      Wheelchair 50 feet with 2 turns activity    Assist    Wheelchair 50 feet with 2 turns activity did not occur: Safety/medical concerns       Wheelchair 150 feet activity     Assist  Wheelchair 150 feet activity did not occur: Safety/medical concerns        Medical Problem List and Plan: 1.  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  Impaired mobility -DVT/anticoagulation:  Pharmaceutical: Continue Heparin  10/6- will change to Lovenox             -antiplatelet therapy: N/A 3. Pain Management: As needed meds 4. Anxiety/depression: Continue Xanax 0.5 mg 3 times daily, BuSpar 5 mg twice daily increase to TID , hydroxyzine 25 mg 4 times daily- d/c atarax due to urinary retention  9/29- will start Celexa 20 mg daily for severe new onset depression. 10/5- mood much better- con't regimen             -antipsychotic agents: N/A 5. Neuropsych: This patient is not fully capable of making decisions on her own behalf. 6. Skin/Wound Care: Routine skin checks 7. Fluids/Electrolytes/Nutrition: Routine in and outs 8.  Hypertension.  Continue Norvasc 5 mg daily, Cozaar 50 mg twice daily.               Monitor with increased mobility Vitals:   12/24/20 2016 12/25/20 0410  BP: (!) 96/54 (!) 97/57  Pulse: 72 74  Resp: 18 18  Temp: 98 F (36.7 C) 97.6 F (36.4 C)  SpO2: 91% 93%     9/30- will con't Abd binder/TEDS/ACE wraps- and monitor- looks a little better this AM 100/58 10/1- reduce cozaar to 25mg  qd, amlodipine to 2.5 mgwould reduce BP meds before starting midodrine or increasing florinef  10/2- will con't fLORINEF AND ADD mIDRODRINE 5 MG tid WITH MEALS/FIRST DOSE [id number].   10/3- started Midodrine since BP was so low yesterday- 60s/40s- will reduce Norvasc to 2.5 mg daily.  Con't Losartan 25 mg BID for now.   10/4- will stop Norvasc completely; increase Florinef to 0.2 mg daily with red MEWS 61/42 yesterday and 90s/50s this AM- and give 0.1 mg x1 of Florinef to increase dose for today- also left note for therapy to use  TEDS and ACE wraps.   10/5- best day for BP yesterday- will give Amantadine and monitor BP closely- might need to reduce Losartan?  10/6- stop Losartan and increase midodrine to 10 mg TID-   10/7-8: BP 100s/60s- off all meds + midodrine and florinef   9.  Hyperlipidemia: Lipitor 10.  COVID-19.  Completed course of remdesivir.  Currently completing course of dexamethasone 11.  Obesity.  BMI 39.52.  Dietary follow-up.  Encourage weight loss  9/29- BMI 34.97- improving.  12.  Leukocytosis-likely secondary to steroids             Afebrile, no signs/symptoms of infection  9/27- no labs yesterday- will check in AM  9/28- WBC down to 11k- con't to monitor weekly.  13.  Transaminitis  ALT elevated on 9/21, continue to monitor  9/27- will recheck CMP in AM  9/29- LFTs 21/60- doing better- con't to monitor  10/7- resolved 14. Orthostatic hypotension: continue TEDs, added abdominal binder in case this is not enough. Improved  reduce cozaar to 25mg  daily , amlodipine to 2.5mg  15.  Urinary retention add urecholine increase to 10mg  QID , now off anticholinergic agents , BP too low for flomax also with orthostasis   10/5- still cannot add Flomax- BP still too low- not voiding- but cathing q6-8 hours-   10/8- will still wait on Flomax since BP low 100s/60s-   16. Nausea  9/27- will schedule Zofran 4 mg at 6am daily to help with nausea and monitor   9/28- doing better- con't regimen.  17. 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  18. Aphasia/poor motor planning  10/5- adding Amantadine 100 mg daily-   10/7- appears to be doing better with aphasia- will d/w therapy if motor planning is any better? 19 Post nasal drip  10/7- addedFlonase for sore throat/post nasal drip- if need be, might need Claritin.    LOS: 18 days A FACE TO [place] 12/25/2020, 9:43 AM

## 2020-12-25 NOTE — Progress Notes (Signed)
Occupational Therapy Session Note  Patient Details  Name: Felicia Chen MRN: [id number] Date of Birth: Dec 26, 1965  Today's Date: 12/25/2020 OT Individual Time: 1415-1500 OT Individual Time Calculation (min): 45 min    Short Term Goals: Week 3:  OT Short Term Goal 1 (Week 3): Pt will perform sit <> stands at [place] with Min A in prep for ADL OT Short Term Goal 2 (Week 3): Pt will perform squat pivot transfer to BSC/toilet with Min A OT Short Term Goal 3 (Week 3): Pt will perform LB dressing with Mod A OT Short Term Goal 4 (Week 3): Pt will improve awareness attending to RUE during ADL tasks in 3 consecutive sessions  Skilled Therapeutic Interventions/Progress Updates:  Patient met lying supine in bed in agreement with OT treatment session. 0/10 pain reported at rest and with activity. Husband present at bedside. Patient excited to report ability to abduct RLE and flex/extend digits of R hand (with synergistic movement pattern). Education on use of synergy to increase independence with self-care tasks and leisure tasks with use of RUE as a stabilizer. Supine to EOB with Min A and squat-pivot to L with Min A to TIS wc. Good recall for hand placement with Min cues for attention to R. Total A for wc transport to outdoor area near hospital atrium. Patient education on self-ROM HEP. Patient able to return demo 5/5 exercises after verbal/visual instruction. Session concluded with patient seated in TIS wc with call bell within reach, belt alarm activated and all needs met.   Therapy Documentation Precautions:  Precautions Precautions: Fall Precaution Comments: R hemi, inattention. SBP parameters <160 per neuro on acute Restrictions Weight Bearing Restrictions: No General:   Therapy/Group: Individual Therapy  [name] 12/25/2020, 3:04 PM

## 2020-12-26 NOTE — Plan of Care (Signed)
  Problem: Consults Goal: RH STROKE PATIENT EDUCATION Description: See Patient Education module for education specifics  Outcome: Progressing   Problem: RH BOWEL ELIMINATION Goal: RH STG MANAGE BOWEL WITH ASSISTANCE Description: STG Manage Bowel with [place]. Outcome: Progressing   Problem: RH BLADDER ELIMINATION Goal: RH STG MANAGE BLADDER WITH ASSISTANCE Description: STG Manage Bladder With Min Assistance Outcome: Progressing   Problem: RH SKIN INTEGRITY Goal: RH STG MAINTAIN SKIN INTEGRITY WITH ASSISTANCE Description: STG Maintain Skin Integrity With [place]. Outcome: Progressing   Problem: RH SAFETY Goal: RH STG ADHERE TO SAFETY PRECAUTIONS W/ASSISTANCE/DEVICE Description: STG Adhere to Safety Precautions With Min Assistance/Device. Outcome: Progressing   Problem: RH PAIN MANAGEMENT Goal: RH STG PAIN MANAGED AT OR BELOW PT'S PAIN GOAL Description: < 3 on a 0-10 pain scale. Outcome: Progressing   Problem: RH BOWEL ELIMINATION Goal: RH STG MANAGE BOWEL W/MEDICATION W/ASSISTANCE Description: STG Manage Bowel with Medication with Assistance. Outcome: Progressing

## 2020-12-26 NOTE — Progress Notes (Addendum)
Speech Language Pathology Daily Session Note  Patient Details  Name: Felicia Chen MRN: [id number] Date of Birth: 18-Mar-1966  Today's Date: 12/26/2020 SLP Individual Time: 1100-1130 SLP Individual Time Calculation (min): 30 min  Short Term Goals: Week 3: SLP Short Term Goal 1 (Week 3): Pt will demonstrate reading comprehenison at the sentence level with 80% accuracy and supervision A verbal cues. SLP Short Term Goal 2 (Week 3): Pt will complete mildly complex tasks with mod A verbal cues. SLP Short Term Goal 3 (Week 3): Pt will self-monitor and self-correct functional and functional/verbal errors with min A verbal cues. SLP Short Term Goal 4 (Week 3): Pt will demonstrate word finding skills in higher level tasks (ex: describing, convergent/divergent naming) with min A verbal cues. SLP Short Term Goal 5 (Week 3): Pt will demonstrate recall of daily, novel information/events with supervision A verbal cues for compensatory strategies (written aids, memory notebooks).  Skilled Therapeutic Interventions:   Pt was seen in room for skilled ST services to facilitate higher-level word finding in conversation. Pt reported she continues to experience difficulty with calculations and this is a concern for her. SLP provided pt with [employer] word-finding strategies list. SLP reviewed each strategy and asked pt to demonstrate use of each. Pt required repetition of explanations 1-2x to gain clarification on how each strategy could benefit pt. Pt was able to demonstrate use of "describe" strategy when provided with SFA prompts. She reported she believes she would most benefit from: "delay", "describe", and "look it up". Pt was left in room with bed alarm in place and immediate needs within reach. Cont with current POC.   Pain Pain Assessment Pain Score: 0-No pain  Therapy/Group: Individual Therapy  Verdene Lennert MS, CCC-SLP, CBIS  12/26/2020, 12:32 PM

## 2020-12-27 NOTE — Progress Notes (Signed)
Occupational Therapy Session Note  Patient Details  Name: Felicia Chen MRN: [id number] Date of Birth: 1966/02/11  Today's Date: 12/27/2020 OT Individual Time: 2957-4734 OT Individual Time Calculation (min): 56 min    Short Term Goals: Week 2:  OT Short Term Goal 1 (Week 2): Pt will perform BSC/toilet transfer with Mod A of 1 OT Short Term Goal 1 - Progress (Week 2): Met OT Short Term Goal 2 (Week 2): Pt will perform RUE PROM HEP with no more than Min cues OT Short Term Goal 2 - Progress (Week 2): Progressing toward goal OT Short Term Goal 3 (Week 2): Pt will perform sit <> stands in prep for ADL with Min A OT Short Term Goal 3 - Progress (Week 2): Progressing toward goal OT Short Term Goal 4 (Week 2): Pt will perform LB dress w/ Mod A and AE PRN OT Short Term Goal 4 - Progress (Week 2): Progressing toward goal Week 3:  OT Short Term Goal 1 (Week 3): Pt will perform sit <> stands at [place] with Min A in prep for ADL OT Short Term Goal 2 (Week 3): Pt will perform squat pivot transfer to BSC/toilet with Min A OT Short Term Goal 3 (Week 3): Pt will perform LB dressing with Mod A OT Short Term Goal 4 (Week 3): Pt will improve awareness attending to RUE during ADL tasks in 3 consecutive sessions   Skilled Therapeutic Interventions/Progress Updates:    Pt greeted at time of session supine in bed resting with daughter Wilfred Curtis present. No pain reported throughout. Donned TEDS total A prior to mobility. Noted increased AROM in RUE and RLE today, able to assist holding RLE up for therapist to don TEDS and have grasp/release pattern in hand. BP in supine 119/72, supine > sit Min A with crossed ankles and BP 101/79. Squat pivot to R side with Mod A, difficulty sequencing to R side this session. BP 116/74 up in chair, no complaints of dizziness or nausea throughout session. Transported to gym and focused on RUE NMR with UE skate at high/low table seated for "windshield wipers" for elbow  flexion/extension. Note good bicep activation but needing max assist to achieve elbow extension despite tapping at tricep. Switched to towel slides for shoulder flexion/extension and elbow flexion/extension. R hand squeezes for pink sponge for 2x10 as well for grip strengthening and intentional grasp/release pattern. Back in room set up with alarm on call bell in reach.   Therapy Documentation Precautions:  Precautions Precautions: Fall Precaution Comments: R hemi, inattention. SBP parameters <160 per neuro on acute Restrictions Weight Bearing Restrictions: No    Therapy/Group: Individual Therapy  [name] 12/27/2020, 7:23 AM

## 2020-12-27 NOTE — Progress Notes (Signed)
Speech Language Pathology Weekly Progress and Session Note  Patient Details  Name: Felicia Chen MRN: [id number] Date of Birth: [birth year]  Beginning of progress report period: December 20, 2020 End of progress report period: December 27, 2020  Today's Date: 12/27/2020 SLP Individual Time: 7262-0355 SLP Individual Time Calculation (min): 55 min  Short Term Goals: Week 3: SLP Short Term Goal 1 (Week 3): Pt will demonstrate reading comprehenison at the sentence level with 80% accuracy and supervision A verbal cues. SLP Short Term Goal 1 - Progress (Week 3): Met SLP Short Term Goal 2 (Week 3): Pt will complete mildly complex tasks with mod A verbal cues. SLP Short Term Goal 2 - Progress (Week 3): Met SLP Short Term Goal 3 (Week 3): Pt will self-monitor and self-correct functional and functional/verbal errors with min A verbal cues. SLP Short Term Goal 3 - Progress (Week 3): Met SLP Short Term Goal 4 (Week 3): Pt will demonstrate word finding skills in higher level tasks (ex: describing, convergent/divergent naming) with min A verbal cues. SLP Short Term Goal 4 - Progress (Week 3): Met SLP Short Term Goal 5 (Week 3): Pt will demonstrate recall of daily, novel information/events with supervision A verbal cues for compensatory strategies (written aids, memory notebooks). SLP Short Term Goal 5 - Progress (Week 3): Met    New Short Term Goals: Week 4: SLP Short Term Goal 1 (Week 4): STG=LTG due to short ELOS (d/c 10/19)  Weekly Progress Updates: Pt made great progress meeting 5 out 5 goals. Pt currently required min A verbal cues for expressive language and mod A verbal cues for complex receptive language, as well as min A verbal cues for mildly complex problem solving, error awareness and recall. Pt participated in functional tasks of medication/money/time management. Pt requires increase structure for organization, especially in numerical tasks. Pt demonstrates overall increase recall of  daily information as well as within a task. Pt's barriers are mildly complex to complex problem solving, error/anticipatory awareness, complex recall and higher level word finding. Pt continues to benefit from skilled ST services in order to maximize functional independence and reduce burden of care, requiring supervision at discharge with continued skilled ST services.      Intensity: Minumum of 1-2 x/day, 30 to 90 minutes Frequency: 3 to 5 out of 7 days Duration/Length of Stay: 10/19 Treatment/Interventions: Cognitive remediation/compensation;Cueing hierarchy;Functional tasks;Speech/Language facilitation;Internal/external aids;Patient/family education   Daily Session  Skilled Therapeutic Interventions:  Skilled ST services focused on cognitive skills. SLP facilitated mildly complex problem solving, organization, error awareness and recall skills within tasks utilizing account balancing and calendar tasks. SLP provided clear structure for step by step directions in account balancing task, where pt preformed calculations on calculator and dictated written information for SLP in note taking. Pt required supervision A verbal cues for recall and min A fading to supervision A verbal cues for problem solving and error awareness. In calendar task pt also dictated information due to reduced RUE movement, pt required min A fade to supervision A verbal cues for problem solving and error awareness. Pt was left in room with call bell within reach and bed alarm set. SLP recommends to continue skilled services.    General    Pain Pain Assessment Pain Score: 0-No pain  Therapy/Group: Individual Therapy  Jayvian Escoe  [facility] 12/27/2020, 12:56 PM

## 2020-12-27 NOTE — Progress Notes (Signed)
[facility] PHYSICAL MEDICINE & REHABILITATION PROGRESS NOTE  Subjective/Complaints:   Pt reports good weekend- wish had more therapy.  BP still on low side-  Can now abduct fingers and flex fingers - but not against gravity- LBM was yesterday on toilet.  Did estim in R shoulder last week.    ROS: Pt denies SOB, abd pain, CP, N/V/C/D, and vision changes   Objective: Vital Signs: Blood pressure 108/63, pulse 75, temperature 98.7 F (37.1 C), temperature source Oral, resp. rate 16, height 5' 7.99" (1.727 m), weight 104.3 kg, SpO2 95 %. No results found. No results for input(s): WBC, HGB, HCT, PLT in the last 72 hours.    No results for input(s): NA, K, CL, CO2, GLUCOSE, BUN, CREATININE, CALCIUM in the last 72 hours.     Intake/Output Summary (Last 24 hours) at 12/27/2020 1822 Last data filed at 12/27/2020 1300 Gross per 24 hour  Intake 300 ml  Output 1400 ml  Net -1100 ml        Physical Exam: BP 108/63 (BP Location: Left Arm)   Pulse 75   Temp 98.7 F (37.1 C) (Oral)   Resp 16   Ht 5' 7.99" (1.727 m)   Wt 104.3 kg   SpO2 95%   BMI 34.97 kg/m         General: awake, alert, appropriate, sitting up more in bed; daughter at bedside; NAD HENT: conjugate gaze; oropharynx moist CV: regular rate; no JVD Pulmonary: CTA B/L; no W/R/R- good air movement GI: soft, NT, ND, (+)BS Psychiatric: appropriate Neurological: alert- improved STM, but still an issue Has 2/5 R finger flexion and trace finger abduction- also 2/5 KE on R Ext: no clubbing, cyanosis, or edema Psych: pleasant and cooperative  Neurological: alert- improving with word finding Hoffman's RUE and few beats clonus RLE remain present  Extremities: No clubbing, cyanosis, or edema Skin: No evidence of breakdown, no evidence of rash A and O x 3   Tone with clonus RIght ankle - few beats with Hoffman's on RUE She does some difficulty differentiating right from left. Motor: RLE: 1-2/5 proximal 0/5  distal- RUE 1/5 with early flexor patterns developing Sensation diminished to light touch RUE/RLE   Assessment/Plan: 1. Functional deficits which require 3+ hours per day of interdisciplinary therapy in a comprehensive inpatient rehab setting. Physiatrist is providing close team supervision and 24 hour management of active medical problems listed below. Physiatrist and rehab team continue to assess barriers to discharge/monitor patient progress toward functional and medical goals   Care Tool:  Bathing    Body parts bathed by patient: Right arm, Chest, Abdomen, Right upper leg, Left upper leg, Face   Body parts bathed by helper: Left arm, Front perineal area, Buttocks, Right lower leg, Left lower leg     Bathing assist Assist Level: Maximal Assistance - Patient 24 - 49%     Upper Body Dressing/Undressing Upper body dressing   What is the patient wearing?: Pull over shirt    Upper body assist Assist Level: Maximal Assistance - Patient 25 - 49%    Lower Body Dressing/Undressing Lower body dressing      What is the patient wearing?: Pants, Underwear/pull up     Lower body assist Assist for lower body dressing: Maximal Assistance - Patient 25 - 49%     Toileting Toileting    Toileting assist Assist for toileting: 2 Helpers     Transfers Chair/bed transfer  Transfers assist     Chair/bed transfer assist level:  Moderate Assistance - Patient 50 - 74% (via squat-pivot)     Locomotion Ambulation   Ambulation assist   Ambulation activity did not occur: Safety/medical concerns  Assist level: Maximal Assistance - Patient 25 - 49% Assistive device:  (hall rail) Max distance: 10   Walk 10 feet activity   Assist  Walk 10 feet activity did not occur: Safety/medical concerns  Assist level: Maximal Assistance - Patient 25 - 49% Assistive device:  (hall rail)   Walk 50 feet activity   Assist Walk 50 feet with 2 turns activity did not occur: Safety/medical  concerns         Walk 150 feet activity   Assist Walk 150 feet activity did not occur: Safety/medical concerns         Walk 10 feet on uneven surface  activity   Assist Walk 10 feet on uneven surfaces activity did not occur: Safety/medical concerns         Wheelchair     Assist Is the patient using a wheelchair?: Yes Type of Wheelchair: Manual Wheelchair activity did not occur: Safety/medical concerns  Wheelchair assist level: Dependent - Patient 0%      Wheelchair 50 feet with 2 turns activity    Assist    Wheelchair 50 feet with 2 turns activity did not occur: Safety/medical concerns       Wheelchair 150 feet activity     Assist  Wheelchair 150 feet activity did not occur: Safety/medical concerns        Medical Problem List and Plan: 1.  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 2.  Impaired mobility -DVT/anticoagulation:  Pharmaceutical: Continue Heparin  10/6- will change to Lovenox             -antiplatelet therapy: N/A 3. Pain Management: As needed meds 4. Anxiety/depression: Continue Xanax 0.5 mg 3 times daily, BuSpar 5 mg twice daily increase to TID , hydroxyzine 25 mg 4 times daily- d/c atarax due to urinary retention  9/29- will start Celexa 20 mg daily for severe new onset depression. 10/5- mood much better- con't regimen             -antipsychotic agents: N/A 5. Neuropsych: This patient is not fully capable of making decisions on her own behalf. 6. Skin/Wound Care: Routine skin checks 7. Fluids/Electrolytes/Nutrition: Routine in and outs 8.  Hypertension.  Continue Norvasc 5 mg daily, Cozaar 50 mg twice daily.               Monitor with increased mobility Vitals:   12/27/20 0538 12/27/20 1438  BP: (!) 108/57 108/63  Pulse: 85 75  Resp: 16 16  Temp:  98.7 F (37.1 C) 98.7 F (37.1 C)  SpO2: 95% 95%    9/30- will con't Abd binder/TEDS/ACE wraps- and monitor- looks a little better this AM 100/58 10/1- reduce cozaar to 25mg  qd, amlodipine to 2.5 mgwould reduce BP meds before starting midodrine or increasing florinef  10/2- will con't fLORINEF AND ADD mIDRODRINE 5 MG tid WITH MEALS/FIRST DOSE 0865HQ.   10/3- started Midodrine since BP was so low yesterday- 60s/40s- will reduce Norvasc to 2.5 mg daily.  Con't Losartan 25 mg BID for now.   10/4- will stop Norvasc completely; increase Florinef to 0.2 mg daily with red MEWS 61/42 yesterday and 90s/50s this  AM- and give 0.1 mg x1 of Florinef to increase dose for today- also left note for therapy to use TEDS and ACE wraps.   10/5- best day for BP yesterday- will give Amantadine and monitor BP closely- might need to reduce Losartan?  10/6- stop Losartan and increase midodrine to 10 mg TID-   10/7-8: BP 100s/60s- off all meds + midodrine and florinef 10/10- BP low 338V usually systolic- on Midodrine and Florinef 0.2 mg daily- will d/w pt tomorrow increasing florinef to 0.3 mg daily.   9.  Hyperlipidemia: Lipitor 10.  COVID-19.  Completed course of remdesivir.  Currently completing course of dexamethasone 11.  Obesity.  BMI 39.52.  Dietary follow-up.  Encourage weight loss  9/29- BMI 34.97- improving.  12.  Leukocytosis-likely secondary to steroids             Afebrile, no signs/symptoms of infection  9/27- no labs yesterday- will check in AM  9/28- WBC down to 11k- con't to monitor weekly.  13.  Transaminitis  ALT elevated on 9/21, continue to monitor  9/27- will recheck CMP in AM  9/29- LFTs 21/60- doing better- con't to monitor  10/7- resolved 14. Orthostatic hypotension: continue TEDs, added abdominal binder in case this is not enough. Improved  reduce cozaar to 25mg  daily , amlodipine to 2.5mg  15.  Urinary retention add urecholine increase to 10mg  QID , now off anticholinergic agents , BP  too low for flomax also with orthostasis   10/5- still cannot add Flomax- BP still too low- not voiding- but cathing q6-8 hours-   10/8- will still wait on Flomax since BP low 100s/60s-   10/10- BP still low- cannot start Flomax with it better.  16. Nausea  9/27- will schedule Zofran 4 mg at 6am daily to help with nausea and monitor   9/28- doing better- con't regimen.  17. 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  18. Aphasia/poor motor planning  10/5- adding Amantadine 100 mg daily-   10/7- appears to be doing better with aphasia- will d/w therapy if motor planning is any better? 19 Post nasal drip  10/7- addedFlonase for sore throat/post nasal drip- if need be, might need Claritin. 10/10- doing better    LOS: 20 days A FACE TO FACE EVALUATION WAS PERFORMED  Elanna Bert 12/27/2020, 6:22 PM

## 2020-12-27 NOTE — Progress Notes (Signed)
Physical Therapy Session Note  Patient Details  Name: Felicia Chen MRN: [id number] Date of Birth: March 31, 1965  Today's Date: 12/27/2020 PT Individual Time: 7014-1030 PT Individual Time Calculation (min): 75 min   Short Term Goals: Week 1:  PT Short Term Goal 1 (Week 1): Pt will initiate gait training PT Short Term Goal 1 - Progress (Week 1): Met PT Short Term Goal 2 (Week 1): Pt will perform STS with mod A of 1 PT Short Term Goal 2 - Progress (Week 1): Met PT Short Term Goal 3 (Week 1): Pt will tolerate OOB sitting between therapy sessions PT Short Term Goal 3 - Progress (Week 1): Met Week 2:  PT Short Term Goal 1 (Week 2): Pt will demonstrate STS with RW and mod A PT Short Term Goal 1 - Progress (Week 2): Met PT Short Term Goal 2 (Week 2): Pt's will perform standing mobility x 2 min without orthostatic symptoms. PT Short Term Goal 2 - Progress (Week 2): Met PT Short Term Goal 3 (Week 2): Pt will perform gait with max A x 10 ft PT Short Term Goal 3 - Progress (Week 2): Met PT Short Term Goal 4 (Week 2): Pt will demo standing activity x 5 min wqithout orthostatic sx. PT Short Term Goal 5 (Week 2): Pt will tolerate sitting in manual w/c x 1 hour  Skilled Therapeutic Interventions/Progress Updates:    pt received in bed and agreeable to therapy. No complaint of pain. Supine<>sit with min A. Squat pivot x 2 to R side with mod A. Sit to stand with min-mod A throughout session with hall way rail and with lite gait. Session focused on high intensity gait training. Gait at hall way rail with max A for RLE advancement and max cues for sequencing at times, 2 x 30 ft. Gait with lite gait 2 x 40 ft, 1 x 65 ft with max a for RLE and max cues for weight shift and sequencing. Pt reported mild nausea and light headedness after first two bouts, pt direct in diaphragmatic breathing for stress management, then instructed to continue breathing during last bout and reported no symptoms. Pt demoed  improved control of RLE, but began using toe strike on LLE during last bout. Pt returned to bed after session and was left with all needs in reach and alarm active.   Therapy Documentation Precautions:  Precautions Precautions: Fall Precaution Comments: R hemi, inattention. SBP parameters <160 per neuro on acute Restrictions Weight Bearing Restrictions: No    Therapy/Group: Individual Therapy  Mickel Fuchs 12/27/2020, 4:47 PM

## 2020-12-28 MED ORDER — AMANTADINE HCL 100 MG PO CAPS
200.0000 mg | ORAL_CAPSULE | Freq: Every day | ORAL | Status: DC
  Administered 2020-12-29: 200 mg via ORAL
  Filled 2020-12-28: qty 2

## 2020-12-28 MED ORDER — FLUDROCORTISONE ACETATE 0.1 MG PO TABS
0.3000 mg | ORAL_TABLET | Freq: Every day | ORAL | Status: DC
  Administered 2020-12-29 – 2021-01-05 (×7): 0.3 mg via ORAL
  Filled 2020-12-28 (×9): qty 3

## 2020-12-28 NOTE — Progress Notes (Signed)
Speech Language Pathology Daily Session Note  Patient Details  Name: Felicia Chen MRN: [id number] Date of Birth: 1965/04/14  Today's Date: 12/28/2020 SLP Individual Time: 0850-0930 SLP Individual Time Calculation (min): 40 min  Short Term Goals: Week 4: SLP Short Term Goal 1 (Week 4): STG=LTG due to short ELOS (d/c 10/19)  Skilled Therapeutic Interventions:Skilled ST services focused on cognitive skills. SLP facilitated mildly complex problem solving, error awareness and recall within task in two organizational word problem tasks (kitchen and closet organization.) Pt required min-supervision A for problem solving and error awareness and supervision A verbal cues for recall. However when pt did make mistakes, pt required coping strategies, likely impacted by anxiety to problem solve mod A how to correct errors.  Pt was left in room with call bell within reach and bed alarm set. SLP recommends to continue skilled services.     Pain  Pain Assessment Pain Scale: 0-10 Pain Score: 0-No pain Pain Type: Acute pain Pain Location: Generalized Pain Descriptors / Indicators: Sore Pain Frequency: Occasional Pain Onset: Unable to tell Pain Intervention(s): Medication (See eMAR)  Therapy/Group: Individual Therapy  Xolani Degracia  [facility] 12/28/2020, 11:55 AM

## 2020-12-28 NOTE — Progress Notes (Signed)
Physical Therapy Session Note  Patient Details  Name: Felicia Chen MRN: [id number] Date of Birth: [birth year]  Today's Date: 12/28/2020 PT Individual Time: 1191-4782 PT Individual Time Calculation (min): 39 min   Short Term Goals: Week 3:  PT Short Term Goal 1 (Week 3): Pt will initiate gait training with RW PT Short Term Goal 2 (Week 3): Pt will demo dynamic standing balance with mod A  Skilled Therapeutic Interventions/Progress Updates:    Pt received supine in bed with her children present and pt agreeable to therapy session. Supine>sitting R EOB, HOB partially elevated, and pt relying heavily on bedrails to come to sitting EOB with min assist for R LE management and trunk upright. Donned tennis shoes. L squat pivot EOB>w/c light mod assist for lifting/pivoting hips and pt recalling sequencing of transfer. Transported to/from gym in w/c for time management and energy conservation. Donned R LE ankle DF assist ACE wrap. Sit>stand w/c>L handrail in hallway with light mod assist for balance and facilitation for increased R LE WBing. Gait training 70ft at L hallway rail with mod assist of 1 and +2 w/c follow - pt demos impaired motor planning with difficulty differentiating R vs L LE resulting in excessively long L LE step length and requiring total assist to advance R LE during swing due to impaired motor planning - requires max assist to block R knee during stance. Gait training 62ft, 2ft 2x with 3 Musketeer support of +2 mod assist for balance and R LE management - continues to demo impaired motor planning and sequencing of LE stepping and responds best to visual cuing to differentiate R vs L LE resulting in pt being able to advance R LE with only min assist for repositioning to avoid excessive external rotation and excessive adduction (of note: during final gait trial at the end, pt started to have increased R LE flexor tone) - also demos improved R stance control though fluctuates from  requiring min to max assist to maintain stability. Transported back to room. R squat pivot TIS w/c>EOB with light mod assist for lifting/pivoting hips after having pt orient to direction pivoting hips for improved motor planning. Sit>supine with supervision using bedrails and HOB slightly elevated. Pt left supine in bed with needs in reach, R UE therapeutically positioned on pillows, and bed alarm on.  Therapy Documentation Precautions:  Precautions Precautions: Fall Precaution Comments: R hemi, inattention. SBP parameters <160 per neuro on acute Restrictions Weight Bearing Restrictions: No   Pain: No reports of pain throughout session.    Therapy/Group: Individual Therapy  Tawana Scale , PT, DPT, NCS, CSRS 12/28/2020, 12:26 PM

## 2020-12-28 NOTE — Progress Notes (Signed)
Physical Therapy Session Note  Patient Details  Name: Felicia Chen MRN: [id number] Date of Birth: 1965-09-25  Today's Date: 12/28/2020 PT Individual Time: 1300-1400 PT Individual Time Calculation (min): 60 min   Short Term Goals: Week 1:  PT Short Term Goal 1 (Week 1): Pt will initiate gait training PT Short Term Goal 1 - Progress (Week 1): Met PT Short Term Goal 2 (Week 1): Pt will perform STS with mod A of 1 PT Short Term Goal 2 - Progress (Week 1): Met PT Short Term Goal 3 (Week 1): Pt will tolerate OOB sitting between therapy sessions PT Short Term Goal 3 - Progress (Week 1): Met  Skilled Therapeutic Interventions/Progress Updates:    pt received in bed and agreeable to therapy. Pt with no c/o pain.   Transfers: Supine>sit with min A for trunk management and cues for technique. Sit>supine with supervision! Squat pivot with CGA and mod VC to L side and mod A to R side with facilitation for weight shift.   Gait Training: Gait x 170 ft with lite gait, 2 standing rest breaks. Max A for advancement and stability of RLE. Pt demoes toe strike with LLE that was not present when walking with hall rail, mod improvement with VC but continues to have abnormal foot progression. Multimodal cues for weight shifting, heel strike, and anxiety management throughout. Pt reported some nausea at end of walk, resolved after resting for several minutes.   Pt returned to room and was set up with saebo on ant tib for improved active dorsiflexion with settings as follows: Saebo Stim One 330 pulse width 35 Hz pulse rate On 8 sec/ off 8 sec Ramp up/ down 2 sec Symmetrical Biphasic wave form  Max intensity 146mA at 500 Ohm load   Pt remained semi reclined in bed and was left with all needs in reach and alarm active.   Therapy Documentation Precautions:  Precautions Precautions: Fall Precaution Comments: R hemi, inattention. SBP parameters <160 per neuro on acute Restrictions Weight Bearing  Restrictions: No    Therapy/Group: Individual Therapy  Mickel Fuchs 12/28/2020, 5:53 PM

## 2020-12-28 NOTE — Progress Notes (Signed)
Occupational Therapy Session Note  Patient Details  Name: Felicia Chen MRN: [id number] Date of Birth: 02-21-1966  Today's Date: 12/28/2020 OT Individual Time: 9977-4142 OT Individual Time Calculation (min): 56 min    Short Term Goals: Week 2:  OT Short Term Goal 1 (Week 2): Pt will perform BSC/toilet transfer with Mod A of 1 OT Short Term Goal 1 - Progress (Week 2): Met OT Short Term Goal 2 (Week 2): Pt will perform RUE PROM HEP with no more than Min cues OT Short Term Goal 2 - Progress (Week 2): Progressing toward goal OT Short Term Goal 3 (Week 2): Pt will perform sit <> stands in prep for ADL with Min A OT Short Term Goal 3 - Progress (Week 2): Progressing toward goal OT Short Term Goal 4 (Week 2): Pt will perform LB dress w/ Mod A and AE PRN OT Short Term Goal 4 - Progress (Week 2): Progressing toward goal Week 3:  OT Short Term Goal 1 (Week 3): Pt will perform sit <> stands at [place] with Min A in prep for ADL OT Short Term Goal 2 (Week 3): Pt will perform squat pivot transfer to BSC/toilet with Min A OT Short Term Goal 3 (Week 3): Pt will perform LB dressing with Mod A OT Short Term Goal 4 (Week 3): Pt will improve awareness attending to RUE during ADL tasks in 3 consecutive sessions   Skilled Therapeutic Interventions/Progress Updates:    Pt greeted at time of session semireclined in bed with family present, no pain throughout session. Discussion at beginning of session regarding muscle soreness, importance of ROM, etc. Donned TEDS total/Max bed level and supine > sit with Min A with cues for crossing ankles. Reviewed AAROM/PROM for RUE with family present for max carry over. Sitting EOB mild dizziness and BP 106/76 with symptoms quickly resolving. Squat pivot bed > TIS CGA/Min toward L side and BP 119/68. Transported to gym and SCIFIT on level 2.5 with ACE on R hand and therapist facilitation at [place] for 5 mins total, also briefly attempting constraint induced movement therapy  of L hand on knee and only using R hand. Switched to towel slides for shoulder abduction/adduction with noted more bicep activation at table top for 2x10. Back in room, squat pivot w/c > recliner over armrest with Min A. Note 2nd helper present throughout for safety. Alarm on call bell in reach.    Therapy Documentation Precautions:  Precautions Precautions: Fall Precaution Comments: R hemi, inattention. SBP parameters <160 per neuro on acute Restrictions Weight Bearing Restrictions: No    Therapy/Group: Individual Therapy  [name] 12/28/2020, 7:18 AM

## 2020-12-28 NOTE — Progress Notes (Signed)
Felicia Chen  Subjective/Complaints:   Felicia Chen reports achy yesterday- wasn't sure what to do- asked Felicia Chen to ask for tylenol prn.  Having more stiffness esp of RUE.  Discussed options on bladder for cathing/foley after d/c- after prolonged discussion, she decided she and husband will learn to cath. Also cannot start Flomax with BP at low level still because it lowers BP more- Will increase Florinef to 0.3 mg daily and monitor swelling- has no now.   ROS:  Felicia Chen denies SOB, abd pain, CP, N/V/C/D, and vision changes   Objective: Vital Signs: Blood pressure 117/72, pulse 86, temperature 98.5 F (36.9 C), resp. rate 18, height 5' 7.99" (1.727 m), weight 104.3 kg, SpO2 95 %. No results found. No results for input(s): WBC, HGB, HCT, PLT in the last 72 hours.    No results for input(s): NA, K, CL, CO2, GLUCOSE, BUN, CREATININE, CALCIUM in the last 72 hours.     Intake/Output Summary (Last 24 hours) at 12/28/2020 1014 Last data filed at 12/28/2020 0540 Gross per 24 hour  Intake 60 ml  Output 950 ml  Net -890 ml        Physical Exam: BP 117/72 (BP Location: Left Arm)   Pulse 86   Temp 98.5 F (36.9 C)   Resp 18   Ht 5' 7.99" (1.727 m)   Wt 104.3 kg   SpO2 95%   BMI 34.97 kg/m          General: awake, alert, appropriate, laying more in bed today- Felicia Chen at bedside; NAD HENT: conjugate gaze; oropharynx moist CV: regular rate; no JVD Pulmonary: CTA B/L; no W/R/R- good air movement GI: soft, NT, ND, (+)BS Psychiatric: appropriate Neurological: alert; still word finiding- a little worse today Has 2/5 R finger flexion and trace finger abduction- also 2/5 KE on R Ext: no clubbing, cyanosis, or edema- no swelling seen Psych: pleasant and cooperative  Neurological: alert- improving with word finding [place] RUE- MAS of 1-1+ in RUE now Extremities: No clubbing, cyanosis, or edema Skin: No evidence of breakdown, no evidence  of rash A and O x 3    She does some difficulty differentiating right from left. Motor: RLE: 1-2/5 proximal 0/5 distal- RUE 1/5 with early flexor patterns developing Sensation diminished to light touch RUE/RLE   Assessment/Plan: 1. Functional deficits which require 3+ hours per day of interdisciplinary therapy in a comprehensive inpatient rehab setting. Physiatrist is providing close team supervision and 24 hour management of active medical problems listed below. Physiatrist and rehab team continue to assess barriers to discharge/monitor patient progress toward functional and medical goals   Care Tool:  Bathing    Body parts bathed by patient: Right arm, Chest, Abdomen, Right upper leg, Left upper leg, Face   Body parts bathed by helper: Left arm, Front perineal area, Buttocks, Right lower leg, Left lower leg     Bathing assist Assist Level: Maximal Assistance - Patient 24 - 49%     Upper Body Dressing/Undressing Upper body dressing   What is the patient wearing?: Pull over shirt    Upper body assist Assist Level: Maximal Assistance - Patient 25 - 49%    Lower Body Dressing/Undressing Lower body dressing      What is the patient wearing?: Pants, Underwear/pull up     Lower body assist Assist for lower body dressing: Maximal Assistance - Patient 25 - 49%     Toileting Toileting    Toileting assist Assist for toileting:  2 Helpers     Transfers Chair/bed transfer  Transfers assist     Chair/bed transfer assist level: Moderate Assistance - Patient 50 - 74% (via squat-pivot)     Locomotion Ambulation   Ambulation assist   Ambulation activity did not occur: Safety/medical concerns  Assist level: Maximal Assistance - Patient 25 - 49% Assistive device:  (hall rail) Max distance: 10   Walk 10 feet activity   Assist  Walk 10 feet activity did not occur: Safety/medical concerns  Assist level: Maximal Assistance - Patient 25 - 49% Assistive device:   (hall rail)   Walk 50 feet activity   Assist Walk 50 feet with 2 turns activity did not occur: Safety/medical concerns         Walk 150 feet activity   Assist Walk 150 feet activity did not occur: Safety/medical concerns         Walk 10 feet on uneven surface  activity   Assist Walk 10 feet on uneven surfaces activity did not occur: Safety/medical concerns         Wheelchair     Assist Is the patient using a wheelchair?: Yes Type of Wheelchair: Manual Wheelchair activity did not occur: Safety/medical concerns  Wheelchair assist level: Dependent - Patient 0%      Wheelchair 50 feet with 2 turns activity    Assist    Wheelchair 50 feet with 2 turns activity did not occur: Safety/medical concerns       Wheelchair 150 feet activity     Assist  Wheelchair 150 feet activity did not occur: Safety/medical concerns        Medical Problem List and Plan: 1.  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  2.  Impaired mobility -DVT/anticoagulation:  Pharmaceutical: Continue Heparin  10/6- will change to Lovenox             -antiplatelet therapy: N/A 3. Pain Management: As needed meds 4. Anxiety/depression: Continue Xanax 0.5 mg 3 times daily, BuSpar 5 mg twice daily increase to TID , hydroxyzine 25 mg 4 times daily- d/c atarax due to urinary retention  9/29- will start Celexa 20 mg daily for severe new onset depression. 10/5- mood much better- con't regimen             -antipsychotic agents: N/A 5. Neuropsych: This patient is not fully capable of making decisions on Felicia Chen own behalf. 6. Skin/Wound Care: Routine skin checks 7. Fluids/Electrolytes/Nutrition: Routine in and outs 8.  Hypertension.  Continue Norvasc 5 mg daily, Cozaar 50 mg twice daily.                Monitor with increased mobility Vitals:   12/27/20 1958 12/28/20 0532  BP: 115/67 117/72  Pulse: 68 86  Resp: 17 18  Temp: 98.4 F (36.9 C) 98.5 F (36.9 C)  SpO2: 96% 95%    9/30- will con't Abd binder/TEDS/ACE wraps- and monitor- looks a little better this AM 100/58 10/1- reduce cozaar to 25mg  qd, amlodipine to 2.5 mgwould reduce BP meds before starting midodrine or increasing florinef  10/2- will con't fLORINEF AND ADD mIDRODRINE 5 MG tid WITH MEALS/FIRST DOSE 3154MG.   10/3- started Midodrine since BP was so low yesterday- 60s/40s- will reduce Norvasc to 2.5 mg daily.  Con't Losartan 25 mg BID for  now.   10/4- will stop Norvasc completely; increase Florinef to 0.2 mg daily with red MEWS 61/42 yesterday and 90s/50s this AM- and give 0.1 mg x1 of Florinef to increase dose for today- also left Chen for therapy to use TEDS and ACE wraps.   10/5- best day for BP yesterday- will give Amantadine and monitor BP closely- might need to reduce Losartan?  10/6- stop Losartan and increase midodrine to 10 mg TID-   10/11- Will increase Florinef to 0.3 mg daily- no other choices- and wait on Flomax until BP in 741O systolic.   9.  Hyperlipidemia: Lipitor 10.  COVID-19.  Completed course of remdesivir.  Currently completing course of dexamethasone 11.  Obesity.  BMI 39.52.  Dietary follow-up.  Encourage weight loss  9/29- BMI 34.97- improving.  12.  Leukocytosis-likely secondary to steroids             Afebrile, no signs/symptoms of infection  9/27- no labs yesterday- will check in AM  9/28- WBC down to 11k- con't to monitor weekly.  13.  Transaminitis  ALT elevated on 9/21, continue to monitor  9/27- will recheck CMP in AM  9/29- LFTs 21/60- doing better- con't to monitor  10/7- resolved 14. Orthostatic hypotension: continue TEDs, added abdominal binder in case this is not enough. Improved  reduce cozaar to 25mg  daily , amlodipine to 2.5mg  15.  Urinary retention add urecholine increase to  10mg  QID , now off anticholinergic agents , BP too low for flomax also with orthostasis   10/11- hopeful increase in Florinef will allow [place] to add Flomax. Family to learn in/out caths! Will start now.  16. Nausea  9/27- will schedule Zofran 4 mg at 6am daily to help with nausea and monitor   9/28- doing better- con't regimen.  17. Spasticity  9/30- developing low level spasticity in RUE- educated Felicia Chen and Felicia Chen on spasticity and how progresses; what it means- that can masquerade as strength at low levels, etc  10/11- spasticity is  little more- asked Felicia Chen to take tylenol prn for stiffness- wait on Baclofen.   18. Aphasia/poor motor planning  10/5- adding Amantadine 100 mg daily-   10/7- appears to be doing better with aphasia- will d/w therapy if motor planning is any better? 19 Post nasal drip  10/7- addedFlonase for sore throat/post nasal drip- if need be, might need Claritin. 10/10- doing better    I spent a total of 37 minutes on total care today- >50% coordination of care- with team conference, speaking with Felicia Chen and Felicia Chen, and nursing about in/out cath education.    LOS: 21 days A FACE TO FACE EVALUATION WAS PERFORMED  Felicia Chen 12/28/2020, 10:14 AM

## 2020-12-28 NOTE — Progress Notes (Signed)
Patient ID: Felicia Chen, female   DOB: 1965-07-07, 55 y.o.   MRN: [id number]  Met with pt and husband who is present to update regarding team conference progress this week. Pt hand and leg have started to get movement back. She is making gains daily this week and both are very encouraged. Discussed their insurance would probably not extend her stay here so need to focus on 10/19, but if therapy team and family feel she can get to next level of function it may not hurt to try for extension and family could appeal this is denied also. Will wait to see how pt does this week and go from there. Discussed nursing will begin I & O cath  education with pt and husband this week. Will await to see if need to begin home health versus OP therapies, dependent upon car transfers and endurance. Will continue to work on discharge needs.

## 2020-12-29 NOTE — Progress Notes (Signed)
Orthopedic Tech Progress Note Patient Details:  Felicia Chen May 28, 1965 [id number]  ARM SLING for comfort   Ortho Devices Type of Ortho Device: Arm sling Ortho Device/Splint Location: RUE Ortho Device/Splint Interventions: Ordered   Post Interventions Patient Tolerated: Well Instructions Provided: Care of device  Janit Pagan 12/29/2020, 2:21 PM

## 2020-12-29 NOTE — Progress Notes (Signed)
Occupational Therapy Session Note  Patient Details  Name: Felicia Chen MRN: [id number] Date of Birth: 1965-06-24  Today's Date: 12/29/2020 OT Individual Time: 2355-7322 OT Individual Time Calculation (min): 43 min    Short Term Goals: Week 2:  OT Short Term Goal 1 (Week 2): Pt will perform BSC/toilet transfer with Mod A of 1 OT Short Term Goal 1 - Progress (Week 2): Met OT Short Term Goal 2 (Week 2): Pt will perform RUE PROM HEP with no more than Min cues OT Short Term Goal 2 - Progress (Week 2): Progressing toward goal OT Short Term Goal 3 (Week 2): Pt will perform sit <> stands in prep for ADL with Min A OT Short Term Goal 3 - Progress (Week 2): Progressing toward goal OT Short Term Goal 4 (Week 2): Pt will perform LB dress w/ Mod A and AE PRN OT Short Term Goal 4 - Progress (Week 2): Progressing toward goal Week 3:  OT Short Term Goal 1 (Week 3): Pt will perform sit <> stands at [place] with Min A in prep for ADL OT Short Term Goal 2 (Week 3): Pt will perform squat pivot transfer to BSC/toilet with Min A OT Short Term Goal 3 (Week 3): Pt will perform LB dressing with Mod A OT Short Term Goal 4 (Week 3): Pt will improve awareness attending to RUE during ADL tasks in 3 consecutive sessions  Skilled Therapeutic Interventions/Progress Updates:    Pt greeted at time of session up in wheelchair with daughter present, ready for session and no pain throughout. Initial part of session spent on verbally reviewing self care routine planning for DC home next week to living on first floor, doing sponge baths either at sink or in room, and showed picture of hair washing board for home use. Both verbalized understanding and aware of where to purchase. Transported to ortho gym and set up on SCIFIT level 3 with ACE on R hand for placement for 5 minues for warm up. Squat pivot chair <> mat Min/CGA toward both R and L side with therapist providing R knee block. Focused on RUE NMR seated with 2x15 large  therapy ball roll outs and weight bearing presses into large therapy ball. Squat pivot back to TIS CGA to L side. Transported back up to [facility] and squat pivot back to bed same manner. Alarm on call bell in reach.  Note also applied Saebo for further NMR for unattended Estim to pt's R deltoid to prevent subluxation, skin in tact pre and post application, running time for 60 mins. See below for details.  Saebo Stim One 330 pulse width 35 Hz pulse rate On 8 sec/ off 8 sec Ramp up/ down 2 sec Symmetrical Biphasic wave form Max intensity 1110m at 500 Ohm load   Therapy Documentation Precautions:  Precautions Precautions: Fall Precaution Comments: R hemi, inattention. SBP parameters <160 per neuro on acute Restrictions Weight Bearing Restrictions: No     Therapy/Group: Individual Therapy  HViona Gilmore10/02/2021, 7:22 AM

## 2020-12-29 NOTE — Plan of Care (Signed)
  Problem: Consults Goal: RH STROKE PATIENT EDUCATION Description: See Patient Education module for education specifics  Outcome: Progressing   Problem: RH BOWEL ELIMINATION Goal: RH STG MANAGE BOWEL WITH ASSISTANCE Description: STG Manage Bowel with [place]. Outcome: Progressing   Problem: RH BLADDER ELIMINATION Goal: RH STG MANAGE BLADDER WITH ASSISTANCE Description: STG Manage Bladder With Min Assistance Outcome: Progressing   Problem: RH SKIN INTEGRITY Goal: RH STG MAINTAIN SKIN INTEGRITY WITH ASSISTANCE Description: STG Maintain Skin Integrity With [place]. Outcome: Progressing   Problem: RH SAFETY Goal: RH STG ADHERE TO SAFETY PRECAUTIONS W/ASSISTANCE/DEVICE Description: STG Adhere to Safety Precautions With Min Assistance/Device. Outcome: Progressing   Problem: RH PAIN MANAGEMENT Goal: RH STG PAIN MANAGED AT OR BELOW PT'S PAIN GOAL Description: < 3 on a 0-10 pain scale. Outcome: Progressing   Problem: RH BOWEL ELIMINATION Goal: RH STG MANAGE BOWEL W/MEDICATION W/ASSISTANCE Description: STG Manage Bowel with Medication with Assistance. Outcome: Progressing

## 2020-12-29 NOTE — Progress Notes (Signed)
Recreational Therapy Session Note  Patient Details  Name: [name] MRN: [id number] Date of Birth: 1966/02/19 Today's Date: 12/29/2020  Pain: no c/o Skilled Therapeutic Interventions/Progress Updates: Pt participated in stress management/coping group today.  Discussion/education focused on stress exploration identifying factors that contribute to stress, factors that protect against stress and potential coping strategies.  Coping strategies included deep breathing, imagery, progressive muscle relaxation & challenging irrational thoughts.  Pt actively participated in discussion.  Handout provided.  Pt appreciative of this session.  Therapy/Group: Group Therapy  Tarron Krolak 12/29/2020, 12:51 PM

## 2020-12-29 NOTE — Progress Notes (Signed)
Speech Language Pathology Daily Session Note  Patient Details  Name: Felicia Chen MRN: [id number] Date of Birth: 09/06/1965  Today's Date: 12/29/2020 SLP Individual Time: 0805-0900 SLP Individual Time Calculation (min): 55 min  Short Term Goals: Week 4: SLP Short Term Goal 1 (Week 4): STG=LTG due to short ELOS (d/c 10/19)  Skilled Therapeutic Interventions:   Patient seen for skilled ST session focusing on aphasia goals. Patient reported that she is starting become more aware of "the reality of life" in terms of the changes and obstacles from her stroke impairments. Her oral reading fluency at sentence and paragraph level continues to improve and she exhibited significantly less hesitations and errors when doing so. She did report that she still has to really focus on text and visual information in right visual field. She completed single and multiple sentence comprehension sections of RCBA-2 with supervision A for 90% accuracy and paragraph reading comprehension with minA verbal and visual cues for 80% accuracy. She exhibited only minimal frequency of hesitations and errors during unstructured conversation and was able to effectively communicate her wants/needs/thoughts. She was left in bed with PT entering room for session. She continues to make consistent progress and benefits from ongoing SLP intervention to maximize cognitive-linguistic abilities prior to discharge.  Pain Pain Assessment Pain Scale: 0-10 Pain Score: 0-No pain  Therapy/Group: Individual Therapy  Sonia Baller, MA, CCC-SLP Speech Therapy

## 2020-12-29 NOTE — Progress Notes (Signed)
[facility] PHYSICAL MEDICINE & REHABILITATION PROGRESS NOTE  Subjective/Complaints:   Pt reports voiding some with BM yesterday - but resigned to in/out caths at home.   Walked 170 ft on lite gait; walked 82 ft x3 with 2 therapists- no assistive device Feeling recognizing emotions more-   ROS:  Pt denies SOB, abd pain, CP, N/V/C/D, and vision changes   Objective: Vital Signs: Blood pressure (!) 92/50, pulse 70, temperature 98.8 F (37.1 C), temperature source Oral, resp. rate 18, height 5' 7.99" (1.727 m), weight 103.3 kg, SpO2 94 %. No results found. No results for input(s): WBC, HGB, HCT, PLT in the last 72 hours.    No results for input(s): NA, K, CL, CO2, GLUCOSE, BUN, CREATININE, CALCIUM in the last 72 hours.     Intake/Output Summary (Last 24 hours) at 12/29/2020 0751 Last data filed at 12/29/2020 0500 Gross per 24 hour  Intake 240 ml  Output 1150 ml  Net -910 ml        Physical Exam: BP (!) 92/50 (BP Location: Left Arm)   Pulse 70   Temp 98.8 F (37.1 C) (Oral)   Resp 18   Ht 5' 7.99" (1.727 m)   Wt 103.3 kg   SpO2 94%   BMI 34.64 kg/m          General: awake, alert, appropriate, laying supine in bed;  NAD HENT: conjugate gaze; oropharynx moist CV: regular rate; no JVD Pulmonary: CTA B/L; no W/R/R- good air movement GI: soft, NT, ND, (+)BS Psychiatric: appropriate Neurological: alert- mild improvement in aphasia.  Has 2/5 R finger flexion and trace finger abduction- also 2/5 KE on R Ext: no clubbing, cyanosis, or edema- no swelling seen  Hoffmans RUE- MAS of 1-1+ in RUE now Extremities: No clubbing, cyanosis, or edema Skin: No evidence of breakdown, no evidence of rash A and O x 3    She does some difficulty differentiating right from left. Motor: RLE: 1-2/5 proximal 0/5 distal- RUE 1/5 with early flexor patterns developing Sensation diminished to light touch RUE/RLE   Assessment/Plan: 1. Functional deficits which require 3+ hours  per day of interdisciplinary therapy in a comprehensive inpatient rehab setting. Physiatrist is providing close team supervision and 24 hour management of active medical problems listed below. Physiatrist and rehab team continue to assess barriers to discharge/monitor patient progress toward functional and medical goals   Care Tool:  Bathing    Body parts bathed by patient: Right arm, Chest, Abdomen, Right upper leg, Left upper leg, Face   Body parts bathed by helper: Left arm, Front perineal area, Buttocks, Right lower leg, Left lower leg     Bathing assist Assist Level: Maximal Assistance - Patient 24 - 49%     Upper Body Dressing/Undressing Upper body dressing   What is the patient wearing?: Pull over shirt    Upper body assist Assist Level: Maximal Assistance - Patient 25 - 49%    Lower Body Dressing/Undressing Lower body dressing      What is the patient wearing?: Pants, Underwear/pull up     Lower body assist Assist for lower body dressing: Maximal Assistance - Patient 25 - 49%     Toileting Toileting    Toileting assist Assist for toileting: 2 Helpers     Transfers Chair/bed transfer  Transfers assist     Chair/bed transfer assist level: Moderate Assistance - Patient 50 - 74% (squat pivot)     Locomotion Ambulation   Ambulation assist   Ambulation activity did  not occur: Safety/medical concerns  Assist level: 2 helpers Assistive device: Other (comment) (3 Musketeer) Max distance: 55ft   Walk 10 feet activity   Assist  Walk 10 feet activity did not occur: Safety/medical concerns  Assist level: Maximal Assistance - Patient 25 - 49% Assistive device: Lite Gait   Walk 50 feet activity   Assist Walk 50 feet with 2 turns activity did not occur: Safety/medical concerns  Assist level: Maximal Assistance - Patient 25 - 49% Assistive device: Lite Gait    Walk 150 feet activity   Assist Walk 150 feet activity did not occur: Safety/medical  concerns  Assist level: Maximal Assistance - Patient 25 - 49% Assistive device: Lite Gait    Walk 10 feet on uneven surface  activity   Assist Walk 10 feet on uneven surfaces activity did not occur: Safety/medical concerns         Wheelchair     Assist Is the patient using a wheelchair?: Yes Type of Wheelchair: Manual Wheelchair activity did not occur: Safety/medical concerns  Wheelchair assist level: Dependent - Patient 0%      Wheelchair 50 feet with 2 turns activity    Assist    Wheelchair 50 feet with 2 turns activity did not occur: Safety/medical concerns       Wheelchair 150 feet activity     Assist  Wheelchair 150 feet activity did not occur: Safety/medical concerns        Medical Problem List and Plan: 1.  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  Impaired mobility -DVT/anticoagulation:  Pharmaceutical: Continue Heparin  10/6- will change to Lovenox             -antiplatelet therapy: N/A 3. Pain Management: As needed meds 4. Anxiety/depression: Continue Xanax 0.5 mg 3 times daily, BuSpar 5 mg twice daily increase to TID , hydroxyzine 25 mg 4 times daily- d/c atarax due to urinary retention  9/29- will start Celexa 20 mg daily for severe new onset depression. 10/5- mood much better- con't regimen 10/12- feeling emotions more and feels like it's going well.              -antipsychotic agents: N/A 5. Neuropsych: This patient is not fully capable of making decisions on her own behalf. 6. Skin/Wound Care: Routine skin checks 7. Fluids/Electrolytes/Nutrition: Routine in and outs 8.  Hypertension.  Continue Norvasc 5 mg daily, Cozaar 50 mg twice daily.               Monitor with increased mobility Vitals:   12/28/20 2001 12/29/20 0453  BP: 101/61 (!) 92/50  Pulse: 79 70   Resp: 18 18  Temp: 98.9 F (37.2 C) 98.8 F (37.1 C)  SpO2: 93% 94%    9/30- will con't Abd binder/TEDS/ACE wraps- and monitor- looks a little better this AM 100/58 10/1- reduce cozaar to 25mg  qd, amlodipine to 2.5 mgwould reduce BP meds before starting midodrine or increasing florinef  10/2- will con't fLORINEF AND ADD mIDRODRINE 5 MG tid WITH MEALS/FIRST DOSE 1093AT.   10/3- started Midodrine since BP was so low yesterday- 60s/40s- will reduce Norvasc to 2.5 mg daily.  Con't Losartan 25 mg BID for now.   10/4- will stop Norvasc completely; increase Florinef to 0.2 mg daily with red MEWS 61/42 yesterday and 90s/50s this  AM- and give 0.1 mg x1 of Florinef to increase dose for today- also left note for therapy to use TEDS and ACE wraps.   10/5- best day for BP yesterday- will give Amantadine and monitor BP closely- might need to reduce Losartan?  10/6- stop Losartan and increase midodrine to 10 mg TID-   10/11- Will increase Florinef to 0.3 mg daily- no other choices- and wait on Flomax until BP in 159Y systolic.    58/59- BP [id number]- con't regimen 9.  Hyperlipidemia: Lipitor 10.  COVID-19.  Completed course of remdesivir.  Currently completing course of dexamethasone 11.  Obesity.  BMI 39.52.  Dietary follow-up.  Encourage weight loss  9/29- BMI 34.97- improving.  12.  Leukocytosis-likely secondary to steroids             Afebrile, no signs/symptoms of infection  9/27- no labs yesterday- will check in AM  9/28- WBC down to 11k- con't to monitor weekly.  13.  Transaminitis  ALT elevated on 9/21, continue to monitor  9/27- will recheck CMP in AM  9/29- LFTs 21/60- doing better- con't to monitor  10/7- resolved 14. Orthostatic hypotension: continue TEDs, added abdominal binder in case this is not enough. Improved  reduce cozaar to 25mg  daily , amlodipine to 2.5mg  15.  Urinary retention add urecholine increase to 10mg  QID , now off anticholinergic agents , BP too low for flomax also  with orthostasis   10/11- hopeful increase in Florinef will allow [place] to add Flomax. Family to learn in/out caths! Will start now.   10/12- pt still not voiding-  16. Nausea  9/27- will schedule Zofran 4 mg at 6am daily to help with nausea and monitor   9/28- doing better- con't regimen.  17. Spasticity  9/30- developing low level spasticity in RUE- educated pt and daughter on spasticity and how progresses; what it means- that can masquerade as strength at low levels, etc  10/11- spasticity is  little more- asked pt to take tylenol prn for stiffness- wait on Baclofen.   18. Aphasia/poor motor planning  10/5- adding Amantadine 100 mg daily-   10/7- appears to be doing better with aphasia- will d/w therapy if motor planning is any better?  10/12- increase Amantadine to 200 mg daily.  19 Post nasal drip  10/7- addedFlonase for sore throat/post nasal drip- if need be, might need Claritin. 10/10- doing better  20. Dispo  10/12- ground pass- wrote for pt.     LOS: 22 days A FACE TO FACE EVALUATION WAS PERFORMED  Abrey Bradway 12/29/2020, 7:51 AM

## 2020-12-29 NOTE — Progress Notes (Signed)
Physical Therapy Session Note  Patient Details  Name: Felicia Chen MRN: [id number] Date of Birth: 1965/06/19  Today's Date: 12/29/2020 PT Individual Time: 0900-1000 PT Individual Time Calculation (min): 60 min   Short Term Goals: Week 1:  PT Short Term Goal 1 (Week 1): Pt will initiate gait training PT Short Term Goal 1 - Progress (Week 1): Met PT Short Term Goal 2 (Week 1): Pt will perform STS with mod A of 1 PT Short Term Goal 2 - Progress (Week 1): Met PT Short Term Goal 3 (Week 1): Pt will tolerate OOB sitting between therapy sessions PT Short Term Goal 3 - Progress (Week 1): Met  Skilled Therapeutic Interventions/Progress Updates:    pt received in bed and agreeable to therapy. No complaint of pain. Pt performed supine>sit with mod A and VC for technique from flat bed. Squat pivot with min A to L side. Donned shoes and R DF ace wrap with tot A. Pt transported to therapy gym for time management and energy conservation. Givmohr sling donned with tot A. Gait x 70 ft with Lite Gait with 1 standing rest break, max A for and advancement and stability of RLE. Improved gait mechanics from previous day, with improved heel strike on LLE and stability during stance on RLE. Pt then became nauseous, dizzy, and mildly disoriented. Upon sitting and tilting in chair, pt recovered after several minutes. Stand pivot transfer with +2 assist to manual w/c. Pt propelled chair back to room with LUE and attempted to assist with LLE, but had difficulty coordinating motion and getting good propulsion, as hemi height chair was not available. Required mod A for steering chair. Pt performed mod A squat pivot from w/c>EOB>TIS chair toward L side. Pt then tilted back in TIS chair with legs elevated to rest before group session, was left with all needs in reach and alarm active.   Pt set up with saebo on ant tib for improved active DF, set as follows: Saebo Stim One 330 pulse width 35 Hz pulse rate On 8 sec/  off 8 sec Ramp up/ down 2 sec Symmetrical Biphasic wave form  Max intensity 149mA at 500 Ohm load   Therapy Documentation Precautions:  Precautions Precautions: Fall Precaution Comments: R hemi, inattention. SBP parameters <160 per neuro on acute Restrictions Weight Bearing Restrictions: No   Therapy/Group: Individual Therapy  Mickel Fuchs 12/29/2020, 12:42 PM

## 2020-12-29 NOTE — Patient Care Conference (Addendum)
Inpatient RehabilitationTeam Conference and Plan of Care Update Date: 12/28/2020   Time: 11:42 AM   Patient Name: Felicia Chen      Medical Record Number: [id number]  Date of Birth: 02-01-1966 Sex: Female         Room/Bed: 4M13C/4M13C-01 Payor Info: Payor: [employer] park [employer] / Plan: [employer] / Product Type: *No Product type* /    Admit Date/Time:  12/07/2020  5:42 PM  Primary Diagnosis:  ICH (intracerebral hemorrhage) [facility])  Hospital Problems: Principal Problem:   ICH (intracerebral hemorrhage) ([place]) Active Problems:   Transaminitis   Right hemiplegia [facility])    Expected Discharge Date: Expected Discharge Date: 01/05/21  Team Members Present: Physician leading conference: Dr. Courtney Heys Social Worker Present: Ovidio Kin, LCSW Nurse Present: Dorthula Nettles, RN PT Present: Ailene Rud, PT OT Present: Lillia Corporal, OT SLP Present: Charolett Bumpers, SLP PPS Coordinator present : Gunnar Fusi, SLP     Current Status/Progress Goal Weekly Team Focus  Bowel/Bladder   Incontinent of B/B. Unable to void, I&O cath Q8hr for no void. LBM 10/9  Regain continence  Timed toileting and PRN   Swallow/Nutrition/ Hydration             ADL's   Min squat pivot to L, Max UB/LB dress (LB at bed level), dizziness much improved, BP fluctuates but much improved, RUE trace at shoulder/biceps and more at digits!  CGA transfers, downgrade toilet and LB to Mod  standing balance, RUE/RLE NMR, sit > stands at LRAD, core strength, hemi education, toilet transfers, motor planning   Mobility   supine<>sit min A, squat pivot min-mod both directions, STS min-mod for RLE, gait up to 65 ft with lite gait  min A transfers, mod A gait  transfers, gait   Communication   Min A expressive and mod-min A comprehension on complex directions  sup A for expression, min A for comprehension  education, higher level word finding, reading paragraphs and following complex commands.    Safety/Cognition/ Behavioral Observations  Min A  supervision  education, mildly complex to complex problem solving, error/anticipatory awareness and recall   Pain   No c/o pain  Remain pain free  Assess Qshift and pRN   Skin   Bruising scattered on abdomen and right side of face.  Maintain skin integrity on remainder of skin  Assess Qshift and PRN     Discharge Planning:  Able to participate more in therpaies due to BP better, making slow gains. Family here daily and will participate when appropriate   Team Discussion: BP still not hight enough for bladder medications. Teach patient and husband I&O cathing. I&O cathing now every 6 hr. No reported pain. Doing better this week. Dizziness improved. BP better during therapy. Some active movement in fingers. Can get some hip flexion. Can do E-stem on BLE. Making good progress with SLP, noticing errors, 2-3 step directions. Working on strategies to get back on track. Patient on target to meet rehab goals: yes, min assist squat pivot to left. Completed toilet transfer. Walked 8 ft in [place] assist for ADL's, lower body mod assist goals. Supine/sit min assist. Min assist goals, mod assist gait.  *See Care Plan and progress notes for long and short-term goals.   Revisions to Treatment Plan:  Adjusting medications, educate patient and spouse on I&O cathing. Continue to monitor BP. Teaching Needs: Family education, medication management, BP management, I&O cathing, transfer training, gait training, balance training, endurance training, safety awareness.  Current Barriers to Discharge:  Decreased caregiver support, Medical stability, Home enviroment access/layout, Neurogenic bowel and bladder, Lack of/limited family support, Weight, Weight bearing restrictions, and Medication compliance  Possible Resolutions to Barriers: Education with spouse, teach I&O cathing to spouse and patient, teach BP management, continue current medications.      Medical Summary Current Status: skin good- cathing q8 hours- will change to q6 hours; BP still low; motor planning still an issue; will increase amantadine  Barriers to Discharge: Decreased family/caregiver support;Home enviroment access/layout;Medical stability;Neurogenic Bowel & Bladder;Weight;Weight bearing restrictions  Barriers to Discharge Comments: home with family; cognition/gets overwhelmed and cannot sequence/motor plan Possible Resolutions to [employer]: doing much better- BP much less limiting; dizziness much better; willl increase amantadine; teach cathing- increase florinef to 0.3 mg daily and maintain midodrine 10 mg TID; d/c 10/19 some new active movement in RUE/RLE- some synergism- can do estim; walked 65 ft lite gait; SLP- lots of progress-   Continued Need for Acute Rehabilitation Level of Care: The patient requires daily medical management by a physician with specialized training in physical medicine and rehabilitation for the following reasons: Direction of a multidisciplinary physical rehabilitation program to maximize functional independence : Yes Medical management of patient stability for increased activity during participation in an intensive rehabilitation regime.: Yes Analysis of laboratory values and/or radiology reports with any subsequent need for medication adjustment and/or medical intervention. : Yes   I attest that I was present, lead the team conference, and concur with the assessment and plan of the team.   Cristi Loron 12/29/2020, 8:58 AM

## 2020-12-29 NOTE — Progress Notes (Signed)
Occupational Therapy Session Note  Patient Details  Name: Felicia Chen MRN: [id number] Date of Birth: 18-Jan-1966  Today's Date: 12/29/2020 OT Group Time: 1100-1200 OT Group Time Calculation (min): 60 min   Short Term Goals: Week 3:  OT Short Term Goal 1 (Week 3): Pt will perform sit <> stands at [place] with Min A in prep for ADL OT Short Term Goal 2 (Week 3): Pt will perform squat pivot transfer to BSC/toilet with Min A OT Short Term Goal 3 (Week 3): Pt will perform LB dressing with Mod A OT Short Term Goal 4 (Week 3): Pt will improve awareness attending to RUE during ADL tasks in 3 consecutive sessions  Skilled Therapeutic Interventions/Progress Updates:  Pt participated in group session with a focus on stress mgmt, education on healthy coping strategies, and social interaction. Focus of session on providing coping strategies to manage new current level of function as a result of new diagnosis.  Session focus on breaking down stressors into "daily hassles," "major life stressors" and "life circumstances" in an effort to allow pts to chunk their stressors into groups. Pt actively sharing stressors and contributing to group conversation. Offered education on factors that protect [place] against stress such as "daily uplifts," "healthy coping strategies" and "protective factors." Encouraged all group members to make an effort to actively recall one event from their day that was a daily uplift in an effort to protect their mindset from stressors. Issued pt handouts on healthy coping strategies to implement into routine. Pt transported back to room by RT.  Therapy Documentation Precautions:  Precautions Precautions: Fall Precaution Comments: R hemi, inattention. SBP parameters <160 per neuro on acute Restrictions Weight Bearing Restrictions: No   Pain: pt reports no pain during session     Therapy/Group: Group Therapy  Precious Haws 12/29/2020, 12:20 PM

## 2020-12-30 MED ORDER — AMANTADINE HCL 100 MG PO CAPS
100.0000 mg | ORAL_CAPSULE | Freq: Every day | ORAL | Status: DC
  Administered 2020-12-31 – 2021-01-05 (×6): 100 mg via ORAL
  Filled 2020-12-30 (×6): qty 1

## 2020-12-30 MED ORDER — INFLUENZA VAC SPLIT QUAD 0.5 ML IM SUSY
0.5000 mL | PREFILLED_SYRINGE | INTRAMUSCULAR | Status: AC
  Administered 2020-12-31: 0.5 mL via INTRAMUSCULAR
  Filled 2020-12-30: qty 0.5

## 2020-12-30 NOTE — Progress Notes (Signed)
Physical Therapy Weekly Progress Note  Patient Details  Name: Felicia Chen MRN: [id number] Date of Birth: March 25, 1965  Beginning of progress report period: December 23, 2020 End of progress report period: December 30, 2020  Today's Date: 12/30/2020 PT Individual Time: 1115-1200 PT Individual Time Calculation (min): 45 min   Patient has met 1 of 2 short term goals.  Discontinued gait with RW goal, not appropriate at this time d/t needing max facilitation or lite gait for gait. Gait up to 170 ft with lite gait. Supine>sit with min A. Sit>supine with min A toward L side and supervision toward R side. Sit to stand with mod A for LE management.   Patient continues to demonstrate the following deficits muscle weakness, decreased cardiorespiratoy endurance, impaired timing and sequencing, abnormal tone, unbalanced muscle activation, decreased coordination, and decreased motor planning, and decreased standing balance, decreased postural control, hemiplegia, and decreased balance strategies and therefore will continue to benefit from skilled PT intervention to increase functional independence with mobility.  Patient progressing toward long term goals..  Continue plan of care.  PT Short Term Goals Week 3:  PT Short Term Goal 1 (Week 3): Pt will initiate gait training with RW PT Short Term Goal 1 - Progress (Week 3): Discontinued (comment) (Not appropriate at this time) PT Short Term Goal 2 (Week 3): Pt will demo dynamic standing balance with mod A PT Short Term Goal 2 - Progress (Week 3): Met Week 4:  PT Short Term Goal 1 (Week 4): =LTGS d/t ELOS  Skilled Therapeutic Interventions/Progress Updates:  Session 1: Pt seated in TIS chair on arrival and agreeable to therapy. No complaint of pain. Pt transported to therapy gym for time management and energy conservation. Session focused on high intensity gait training for increased cardiovascular demand and functional mobility. Gait 2 x 83 ft and 1 x  70 ft with three musketeers +2 assist. Pt able to advance RLE with assist to maintain neutral hip rotation and block knee. Pt did well with cues to "get that foot on the ground," especially with saying it out loud with each step. Pt returned to bed after session with min A squat pivot transfer to R side. Sit>supine with min A to manage RLE, min A scooting to HOB. Pt demoed alternating flexor and extensor tone during bed mobility and gait. Pt remained in bed after session and was left with all needs in reach and alarm active.   Session 2:  pt received in bed and agreeable to therapy. Supine>sit with min A for RLE management. Pt's daughter [name] assisted with squat pivot transfer to L into hemi height w/c, with therapist providing min A to complete transfer and providing cues for set up and positioning. Hemi w/c propulsion x 200 ft, max VC and mod assist for steering. Pt had difficulty coordinating LLE to propel chair. Began with just LLE and assist for propulsion and steering to practice new motor plan before adding UE. Pt able to coordinate both movements with repetition, but required assist for steering. Pt demoes poor awareness of R side, not realizing she was nearly touching a wall until told to turn her head and look. Min improvement in awareness with therapist standing on R side telling pt not to run over her toes. Gait training in lite gait x 160 ft with DF assist ace wrap, binder, and max A to advance RLE. With more freedom to weightshift, pt was unable to advance RLE as she had in the morning. Multimodal cues to  facilitate weight shifting throughout. Several standing rest breaks required to calm anxiety and use strategies to raise BP. Pt returned to room after session, [name] assisted with transfer in same manner as above, slightly more assist from therapist d/t fatigue. Bed mobility with min A for LE management and pt was left with all needs in reach and alarm active.   Note: Pt was set up with saebo  at end of session on anterior tib for improvement of active DF motion. Pt and daughter were instructed to adjust settings if needing higher stimulation with time. No skin irritation noted during or after treatment. Settings noted below: Saebo Stim One x 60 min  330 pulse width 35 Hz pulse rate On 8 sec/ off 8 sec Ramp up/ down 2 sec Symmetrical Biphasic wave form  Max intensity 178m at 500 Ohm load   Ambulation/gait training;Cognitive remediation/compensation;Discharge planning;Functional mobility training;Pain management;DME/adaptive equipment instruction;Psychosocial support;UE/LE Strength taining/ROM;Therapeutic Activities;Visual/perceptual remediation/compensation;Balance/vestibular training;Community reintegration;Disease management/prevention;Neuromuscular re-education;Patient/family education;Skin care/wound management;Stair training;Therapeutic Exercise;Wheelchair propulsion/positioning;UE/LE Coordination activities;Splinting/orthotics;Functional electrical stimulation   Therapy Documentation Precautions:  Precautions Precautions: Fall Precaution Comments: R hemi, inattention. SBP parameters <160 per neuro on acute Restrictions Weight Bearing Restrictions: No     Therapy/Group: Individual Therapy  OMickel Fuchs10/13/2022, 12:39 PM

## 2020-12-30 NOTE — Progress Notes (Signed)
Occupational Therapy Session Note  Patient Details  Name: Felicia Chen MRN: [id number] Date of Birth: April 02, 1965  Today's Date: 12/31/2020 OT Individual Time: 1025-8527 OT Individual Time Calculation (min): 58 min    Short Term Goals: Week 3:  OT Short Term Goal 1 (Week 3): Pt will perform sit <> stands at [place] with Min A in prep for ADL OT Short Term Goal 1 - Progress (Week 3): Progressing toward goal OT Short Term Goal 2 (Week 3): Pt will perform squat pivot transfer to BSC/toilet with Min A OT Short Term Goal 2 - Progress (Week 3): Met OT Short Term Goal 3 (Week 3): Pt will perform LB dressing with Mod A OT Short Term Goal 3 - Progress (Week 3): Progressing toward goal OT Short Term Goal 4 (Week 3): Pt will improve awareness attending to RUE during ADL tasks in 3 consecutive sessions OT Short Term Goal 4 - Progress (Week 3): Progressing toward goal  Skilled Therapeutic Interventions/Progress Updates:    Pt greeted in bed with no c/o pain. Saebo Stim unit removed from her Rt LE with skin intact. Her spouse was present, OT asked if he wanted to participate in hands on family training with spouse opting to wait until scheduled education after the weekend. Pt agreeable to sit outside in the sun today during therapy. Mod A for supine<sit and Min A for squat pivot<w/c with vcs. Escorted pt via w/c to the outdoor patio and worked on self ROM and pt education regarding joint protection. She required vcs to count via breaths vs out loud to maintain prolonged stretches. Education provided on using diaphragmatic breathing for anxiety and pain mgt with pt guided through breathing exercises while outdoors today. She was then returned to the room and completed squat pivot<bed with Mod A due to height of w/c. She remained comfortably in bed at close of session, all needs within reach and bed alarm set.   Therapy Documentation Precautions:  Precautions Precautions: Fall Precaution Comments: R  hemi, inattention. SBP parameters <160 per neuro on acute Restrictions Weight Bearing Restrictions: No:   Vital Signs: Therapy Vitals Temp: 99.1 F (37.3 C) Temp Source: Oral Pulse Rate: (!) 102 Resp: 16 BP: 110/70 Patient Position (if appropriate): Lying Oxygen Therapy SpO2: 94 % O2 Device: Room Air Pain: Pain Assessment Pain Scale: 0-10 Pain Score: 2  Pain Type: Acute pain Pain Location: Generalized Pain Orientation: Right;Left Pain Descriptors / Indicators: Sore Pain Frequency: Intermittent Pain Onset: On-going Patients Stated Pain Goal: 1 Pain Intervention(s): Medication (See eMAR) ADL: ADL Eating: Not assessed Grooming: Not assessed Upper Body Bathing: Moderate assistance Lower Body Bathing: Maximal assistance Upper Body Dressing: Maximal assistance Lower Body Dressing: Dependent Toileting: Dependent Toilet Transfer: Other (comment) (stedy)  Therapy/Group: Individual Therapy  Calianna Kim A Sincere Liuzzi 12/31/2020, 4:09 PM

## 2020-12-30 NOTE — Progress Notes (Signed)
Speech Language Pathology Daily Session Note  Patient Details  Name: Felicia Chen MRN: [id number] Date of Birth: Sep 12, 1965  Today's Date: 12/30/2020 SLP Individual Time: 7322-0254 SLP Individual Time Calculation (min): 40 min  Short Term Goals: Week 4: SLP Short Term Goal 1 (Week 4): STG=LTG due to short ELOS (d/c 10/19)  Skilled Therapeutic Interventions:   Patient seen for skilled ST session focusing on aphasia goals. NT shadowing speech session and patient's daughter both present as well during session. Patient able to describe to NT her basic history related to progression of therapy as well as group session yesterday which focused on coping. Patient continues to demonstrate improving anticipatory awareness. She then participated in reading comprehension of sentence level directions and was 100% accurate with very minimal verbal, visual cues. She then completed reading comprehension at paragraph level, summarizing paragraph with approximately 70% accuracy and answering comprehension questions with minA verbal, visual cues for 85% accuracy. Patient left in bed with OT entering room for session. She continues to benefit from skilled SLP intervention to maximize language and cognitive function prior to discharge.  Pain Pain Assessment Pain Scale: 0-10 Pain Score: 0-No pain  Therapy/Group: Individual Therapy  Sonia Baller, MA, CCC-SLP Speech Therapy

## 2020-12-30 NOTE — Progress Notes (Signed)
Occupational Therapy Weekly Progress Note  Patient Details  Name: Felicia Chen MRN: [id number] Date of Birth: 19-Nov-1965  Beginning of progress report period: December 22, 2020 End of progress report period: December 30, 2020    Patient has met 1 of 4 short term goals.  Pt has made good progress toward OT goals during this reporting period. Pt is performing squat pivot transfers to bed, wheelchair, and BSC/toilet with CGA/Min A with R knee blocked and multimodal cues. Pt prefers to use OT sessions for tasks other than self care, but at last assessment, pt has significantly improved sitting and standing balance but continues to be Mod/Max A with self care tasks with Min/Mod goals. Family ed/training has been initiated with daughter Felicia Chen and official family training planned for next week prior to DC home. Pt also has increased AROM in R wrist and digits but has not been able to functionally use yet at this time. Will continue to use Saebo at R shoulder to prevent dislocation. BP has been better controlled, and pt is very motivated to participate.   Patient continues to demonstrate the following deficits: muscle weakness, decreased cardiorespiratoy endurance, impaired timing and sequencing, abnormal tone, unbalanced muscle activation, decreased coordination, and decreased motor planning, decreased attention to right and decreased motor planning, and decreased sitting balance, decreased standing balance, decreased postural control, hemiplegia, and decreased balance strategies and therefore will continue to benefit from skilled OT intervention to enhance overall performance with BADL and Reduce care partner burden.  Patient progressing toward long term goals..  Continue plan of care.  OT Short Term Goals Week 3:  OT Short Term Goal 1 (Week 3): Pt will perform sit <> stands at [place] with Min A in prep for ADL OT Short Term Goal 1 - Progress (Week 3): Progressing toward goal OT Short Term Goal 2  (Week 3): Pt will perform squat pivot transfer to BSC/toilet with Min A OT Short Term Goal 2 - Progress (Week 3): Met OT Short Term Goal 3 (Week 3): Pt will perform LB dressing with Mod A OT Short Term Goal 3 - Progress (Week 3): Progressing toward goal OT Short Term Goal 4 (Week 3): Pt will improve awareness attending to RUE during ADL tasks in 3 consecutive sessions OT Short Term Goal 4 - Progress (Week 3): Progressing toward goal Week 4:  OT Short Term Goal 1 (Week 4): STGs = LTGs at [facility]    Therapy Documentation Precautions:  Precautions Precautions: Fall Precaution Comments: R hemi, inattention. SBP parameters <160 per neuro on acute Restrictions Weight Bearing Restrictions: No   Therapy/Group: Individual Therapy  [name] 12/30/2020, 4:50 PM

## 2020-12-30 NOTE — Progress Notes (Addendum)
[facility] PHYSICAL MEDICINE & REHABILITATION PROGRESS NOTE  Subjective/Complaints:   Pt had bad episode of lightheadedness in lite gait yesterday- we discussed most likely cause was increase Amantadine- she agrees as well as daughter to decrease back to previous dose.   Went 12 hours between caths yesterday x1- firs time this has occurred.     ROS:  Pt denies SOB, abd pain, CP, N/V/C/D, and vision changes   Objective: Vital Signs: Blood pressure 111/69, pulse 85, temperature 98.5 F (36.9 C), temperature source Oral, resp. rate 16, height 5' 7.99" (1.727 m), weight 103.3 kg, SpO2 97 %. No results found. No results for input(s): WBC, HGB, HCT, PLT in the last 72 hours.    No results for input(s): NA, K, CL, CO2, GLUCOSE, BUN, CREATININE, CALCIUM in the last 72 hours.     Intake/Output Summary (Last 24 hours) at 12/30/2020 1007 Last data filed at 12/30/2020 0825 Gross per 24 hour  Intake 240 ml  Output 800 ml  Net -560 ml        Physical Exam: BP 111/69 (BP Location: Left Arm)   Pulse 85   Temp 98.5 F (36.9 C) (Oral)   Resp 16   Ht 5' 7.99" (1.727 m)   Wt 103.3 kg   SpO2 97%   BMI 34.64 kg/m           General: awake, alert, appropriate, laying supine in bed; daughter at bedside; NAD HENT: conjugate gaze; oropharynx moist CV: regular rate; no JVD Pulmonary: CTA B/L; no W/R/R- good air movement GI: soft, NT, ND, (+)BS; protuberant Psychiatric: appropriate- slightly delayed responses Neurological: expressive aphasia- at sentence level.   Has 2/5 R finger flexion and trace finger abduction- also 2/5 KE on R; also has some R hip external rotation- no DF yet Ext: no clubbing, cyanosis, or edema- no swelling seen  Hoffmans RUE- MAS of 2 in R elbow and wrist today- les sin shoulder and finger- ~ 1-1+.  Extremities: No clubbing, cyanosis, or edema Skin: No evidence of breakdown, no evidence of rash A and O x 3    She does some difficulty  differentiating right from left. Motor: RLE: 1-2/5 proximal 0/5 distal- RUE 1/5 with early flexor patterns developing Sensation diminished to light touch RUE/RLE   Assessment/Plan: 1. Functional deficits which require 3+ hours per day of interdisciplinary therapy in a comprehensive inpatient rehab setting. Physiatrist is providing close team supervision and 24 hour management of active medical problems listed below. Physiatrist and rehab team continue to assess barriers to discharge/monitor patient progress toward functional and medical goals   Care Tool:  Bathing    Body parts bathed by patient: Right arm, Chest, Abdomen, Right upper leg, Left upper leg, Face   Body parts bathed by helper: Left arm, Front perineal area, Buttocks, Right lower leg, Left lower leg     Bathing assist Assist Level: Maximal Assistance - Patient 24 - 49%     Upper Body Dressing/Undressing Upper body dressing   What is the patient wearing?: Pull over shirt    Upper body assist Assist Level: Maximal Assistance - Patient 25 - 49%    Lower Body Dressing/Undressing Lower body dressing      What is the patient wearing?: Pants, Underwear/pull up     Lower body assist Assist for lower body dressing: Maximal Assistance - Patient 25 - 49%     Toileting Toileting    Toileting assist Assist for toileting: 2 Helpers     Transfers Chair/bed  transfer  Transfers assist     Chair/bed transfer assist level: Moderate Assistance - Patient 50 - 74% (squat pivot)     Locomotion Ambulation   Ambulation assist   Ambulation activity did not occur: Safety/medical concerns  Assist level: 2 helpers Assistive device: Other (comment) (3 [place]) Max distance: 33ft   Walk 10 feet activity   Assist  Walk 10 feet activity did not occur: Safety/medical concerns  Assist level: Maximal Assistance - Patient 25 - 49% Assistive device: Lite Gait   Walk 50 feet activity   Assist Walk 50 feet with 2  turns activity did not occur: Safety/medical concerns  Assist level: Maximal Assistance - Patient 25 - 49% Assistive device: Lite Gait    Walk 150 feet activity   Assist Walk 150 feet activity did not occur: Safety/medical concerns  Assist level: Maximal Assistance - Patient 25 - 49% Assistive device: Lite Gait    Walk 10 feet on uneven surface  activity   Assist Walk 10 feet on uneven surfaces activity did not occur: Safety/medical concerns         Wheelchair     Assist Is the patient using a wheelchair?: Yes Type of Wheelchair: Manual Wheelchair activity did not occur: Safety/medical concerns  Wheelchair assist level: Dependent - Patient 0%      Wheelchair 50 feet with 2 turns activity    Assist    Wheelchair 50 feet with 2 turns activity did not occur: Safety/medical concerns       Wheelchair 150 feet activity     Assist  Wheelchair 150 feet activity did not occur: Safety/medical concerns        Medical Problem List and Plan: 1.  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  Impaired mobility -DVT/anticoagulation:  Pharmaceutical: Continue Heparin  10/6- will change to Lovenox             -antiplatelet therapy: N/A 3. Pain Management: As needed meds 4. Anxiety/depression: Continue Xanax 0.5 mg 3 times daily, BuSpar 5 mg twice daily increase to TID , hydroxyzine 25 mg 4 times daily- d/c atarax due to urinary retention  9/29- will start Celexa 20 mg daily for severe new onset depression. 10/5- mood much better- con't regimen 10/12- feeling emotions more and feels like it's going well.               -antipsychotic agents: N/A 5. Neuropsych: This patient is not fully capable of making decisions on her own behalf. 6. Skin/Wound Care: Routine skin checks 7. Fluids/Electrolytes/Nutrition: Routine in and outs 8.  Hypertension.  Continue Norvasc 5 mg daily, Cozaar 50 mg twice daily.               Monitor with increased mobility Vitals:   12/29/20 2035 12/30/20 0540  BP: 106/62 111/69  Pulse: 61 85  Resp: 14 16  Temp: 98.4 F (36.9 C) 98.5 F (36.9 C)  SpO2: 95% 97%    9/30- will con't Abd binder/TEDS/ACE wraps- and monitor- looks a little better this AM 100/58 10/1- reduce cozaar to 25mg  qd, amlodipine to 2.5 mgwould reduce BP meds before starting  midodrine or increasing florinef  10/2- will con't fLORINEF AND ADD mIDRODRINE 5 MG tid WITH MEALS/FIRST DOSE 5102HE.   10/3- started Midodrine since BP was so low yesterday- 60s/40s- will reduce Norvasc to 2.5 mg daily.  Con't Losartan 25 mg BID for now.   10/4- will stop Norvasc completely; increase Florinef to 0.2 mg daily with red MEWS 61/42 yesterday and 90s/50s this AM- and give 0.1 mg x1 of Florinef to increase dose for today- also left note for therapy to use TEDS and ACE wraps.   10/5- best day for BP yesterday- will give Amantadine and monitor BP closely- might need to reduce Losartan?  10/6- stop Losartan and increase midodrine to 10 mg TID-   10/11- Will increase Florinef to 0.3 mg daily- no other choices- and wait on Flomax until BP in 527P systolic.    10/13- BP 824-235 systolic- will decrease Amantadine since it's most likely cause.  9.  Hyperlipidemia: Lipitor 10.  COVID-19.  Completed course of remdesivir.  Currently completing course of dexamethasone 11.  Obesity.  BMI 39.52.  Dietary follow-up.  Encourage weight loss  9/29- BMI 34.97- improving.  12.  Leukocytosis-likely secondary to steroids             Afebrile, no signs/symptoms of infection  9/27- no labs yesterday- will check in AM  9/28- WBC down to 11k- con't to  monitor weekly.  13.  Transaminitis  ALT elevated on 9/21, continue to monitor  9/27- will recheck CMP in AM  9/29- LFTs 21/60- doing better- con't to monitor  10/7- resolved 14. Orthostatic hypotension: continue TEDs, added abdominal binder in case this is not enough. Improved  reduce cozaar to 25mg  daily , amlodipine to 2.5mg  15.  Urinary retention add urecholine increase to 10mg  QID , now off anticholinergic agents , BP too low for flomax also with orthostasis   10/11- hopeful increase in Florinef will allow [place] to add Flomax. Family to learn in/out caths! Will start now.   10/13- cannot start Flomax due to low BP- still not voiding.  16. Nausea  9/27- will schedule Zofran 4 mg at 6am daily to help with nausea and monitor   9/28- doing better- con't regimen.  17. Spasticity  9/30- developing low level spasticity in RUE- educated pt and daughter on spasticity and how progresses; what it means- that can masquerade as strength at low levels, etc  10/11- spasticity is  little more- asked pt to take tylenol prn for stiffness- wait on Baclofen.    10/13- canot start Spasitcity meds due to risk of lowering BP- went over ROM exercises for daughter to help-demonstrated how to do, esp for hip and shoulder. Will schedule for Dr Ranell Patrick to do Botox of RUE after d/c- 300 units RUE- since spasitcity progressively worse.  18. Aphasia/poor motor planning  10/5- adding Amantadine 100 mg daily-   10/7- appears to be doing better with aphasia- will d/w therapy if motor planning is any better?  10/12- increase Amantadine to 200 mg daily.   10/13- will decrease back to 100 mg due to low BP episode.  19 Post nasal drip  10/7- addedFlonase for sore throat/post nasal drip- if need be, might need Claritin. 10/10- doing better  20. Dispo  10/12- ground pass- wrote for pt.   I spent a total of of 39 minutes on total care- >50% on coordination of care- educating pt and daughter about spasticity, amantadine and  how affects BP; as well as d/w PA and  trying to get f/u with Dr Ranell Patrick for Botox.   LOS: 23 days A FACE TO FACE EVALUATION WAS PERFORMED  Felicia Chen 12/30/2020, 10:07 AM

## 2020-12-30 NOTE — Progress Notes (Signed)
Occupational Therapy Session Note  Patient Details  Name: Felicia Chen MRN: [id number] Date of Birth: 07/16/1965  Today's Date: 12/30/2020 OT Individual Time: 7903-8333 OT Individual Time Calculation (min): 59 min    Short Term Goals: Week 2:  OT Short Term Goal 1 (Week 2): Pt will perform BSC/toilet transfer with Mod A of 1 OT Short Term Goal 1 - Progress (Week 2): Met OT Short Term Goal 2 (Week 2): Pt will perform RUE PROM HEP with no more than Min cues OT Short Term Goal 2 - Progress (Week 2): Progressing toward goal OT Short Term Goal 3 (Week 2): Pt will perform sit <> stands in prep for ADL with Min A OT Short Term Goal 3 - Progress (Week 2): Progressing toward goal OT Short Term Goal 4 (Week 2): Pt will perform LB dress w/ Mod A and AE PRN OT Short Term Goal 4 - Progress (Week 2): Progressing toward goal Week 3:  OT Short Term Goal 1 (Week 3): Pt will perform sit <> stands at [place] with Min A in prep for ADL OT Short Term Goal 2 (Week 3): Pt will perform squat pivot transfer to BSC/toilet with Min A OT Short Term Goal 3 (Week 3): Pt will perform LB dressing with Mod A OT Short Term Goal 4 (Week 3): Pt will improve awareness attending to RUE during ADL tasks in 3 consecutive sessions   Skilled Therapeutic Interventions/Progress Updates:    Pt greeted at time of session semireclined in bed, daughter present for session. No pain reported throughout session. Supine > sit from flat bed Min/Mod A with hemitechniques and crossing LE's. Sitting EOB, simulated ADL for bathing/dressing demonstrating lateral leans and sit <> stands and/or bed level dressing for the pt to utilize techniques at home. Demonstrated and had daughter Felicia Chen practice sit <> stands at [employer] with R knee block. Squat pivot bed <> wheelchair Min/CGA with pt wearing sling throughout transfer to protect RUE. Daughter Felicia Chen also practicing squat pivot transfer bed > wheelchair with R knee block and appreciated practice.  Would benefit from further practice. Wheelchair transport to bathroom and squat pivot TIS/wheelchair <> toilet with Min/Mod A requiring cues to fully scoot back into chair 2/2 decreased R side awareness. Reviewed with pt and family regarding BSC placement in room at home as pt's bathroom is not w/c accessible. BP checked and 92/66 with minimal fatigue, reclined TIS and rechecked 100/64. Pt feeling well, hand off to nursing for med pass. Also applied Saebo for Estim to R shoulder deltoid to increase strength and decrease risk of subluxation. Skin in tact pre and post. See below for details.   Saebo Stim One 330 pulse width 35 Hz pulse rate On 8 sec/ off 8 sec Ramp up/ down 2 sec Symmetrical Biphasic wave form Max intensity 171mA at 500 Ohm load    Therapy Documentation Precautions:  Precautions Precautions: Fall Precaution Comments: R hemi, inattention. SBP parameters <160 per neuro on acute Restrictions Weight Bearing Restrictions: No     Therapy/Group: Individual Therapy  [name] 12/30/2020, 7:17 AM

## 2020-12-30 NOTE — Progress Notes (Signed)
Recreational Therapy Session Note  Patient Details  Name: Felicia Chen MRN: [id number] Date of Birth: December 11, 1965 Today's Date: 12/30/2020  Pain: no c/o Skilled Therapeutic Interventions/Progress Updates: Session focused on debriefing stress management session from yesterday, activity analysis/modifications & discharge planning.  Pt continues to state appreciation for the group session and how helpful it was to be in a group with other pts that are going through similar stressful situations.  Also discussed activity analysis in regard to how people in her support team could support her as she transitions home and back into some normalcy.  Discussed breaking tasks down into smaller parts, allowing herself to manage a portion as needed and delegating other parts to other people.  Discussed the importance of her staying active and engaged in activities of meaning and realistic expectations of herself.  Briefly discussed pet safety including how to safely reintroduce herself to her pets, encouraged pt to be seated and prepared before dogs enter the room to prevent falls.  Pt stated understanding. Therapy/Group: Individual Therapy  [name] 12/30/2020, 9:36 AM

## 2020-12-31 NOTE — Progress Notes (Signed)
Speech Language Pathology Daily Session Note  Patient Details  Name: Felicia Chen MRN: [id number] Date of Birth: 1965-10-02  Today's Date: 12/31/2020 SLP Individual Time: 1100-1200 SLP Individual Time Calculation (min): 60 min  Short Term Goals: Week 4: SLP Short Term Goal 1 (Week 4): STG=LTG due to short ELOS (d/c 10/19)  Skilled Therapeutic Interventions:   Patient seen with daughter present for entire session and spouse present at end of session, for skilled ST session focusing on cognitive-linguistic goals and discussion of progress. Patient able to answer questions related to medication labels, finding information related to question SLP asking (How many tablets to take each day?). She was 100% accurate with locating and reading correct information and 75% accurate with interpreting information. She requested to try another math/computational word problem as she feels this is still difficult for her. SLP set up a problem involving patient to determine a person's age when given their birthdate. She was able to determine correct calculations, but had significant difficulty when attempting to complete calculation in head and overall was significantly disorganized  with this type of problem. SLP provided mod-maxA cues to break down problem into simple steps which did aid in patient's ability to perform. SLP gave suggestions of real world type problems (money management, etc) that would involve using numbers as this appears important to patient. Patient wondered aloud about what type of stroke she had and so SLP wrote down information about stroke and encouraged her to look up information about parts of the brain and their function and relate it to her own stroke and symptoms. SLP recommended patient do this with family so as not to become overwhelmed or stressed and also emphasized importance of being careful in selecting sources of medical information. Patient left in bed with family present.  She continues to benefit from skilled SLP intervention to maximize cognitive-linguistic function prior to discharge.  Pain Pain Assessment Pain Scale: 0-10 Pain Score: 0-No pain Pain Type: Acute pain Pain Location: Generalized Pain Orientation: Right;Left Pain Descriptors / Indicators: Sore Pain Frequency: Intermittent Pain Onset: On-going Patients Stated Pain Goal: 1 Pain Intervention(s): Medication (See eMAR)  Therapy/Group: Individual Therapy  Sonia Baller, MA, CCC-SLP Speech Therapy

## 2020-12-31 NOTE — Progress Notes (Signed)
Occupational Therapy Session Note  Patient Details  Name: Felicia Chen MRN: [id number] Date of Birth: 07/04/1965  Today's Date: 01/01/2021 OT Individual Time: 6415-8309 OT Individual Time Calculation (min): 62 min    Skilled Therapeutic Interventions/Progress Updates:    Pt greeted in bed and premedicated for pain. Her daughters Felicia Chen and Felicia Chen were present for family education. Strongly educated them to adopt a stretching routine at home for Rt UE/LE. Taught pt and family stretches/ROM using the gait belt for the Rt LE and also gentle shoulder stretches for the Rt UE. Both daughters had hands on practice assisting pt to ensure proper joint protection, technique, and alignment. Note that pts Rt hip is very externally rotated at rest, discussed positioning strategies to improve with time. Reviewed positioning to promote Rt UE shoulder external rotation. Both pt and daughters were very receptive to family education today in prep for d/c home next week. Pt remained in bed at close of session, all needs within reach and bed alarm set.   Therapy Documentation Precautions:  Precautions Precautions: Fall Precaution Comments: R hemi, inattention. SBP parameters <160 per neuro on acute Restrictions Weight Bearing Restrictions: No Pain: Pain Assessment Pain Score: 2  ADL: ADL Eating: Not assessed Grooming: Not assessed Upper Body Bathing: Moderate assistance Lower Body Bathing: Maximal assistance Upper Body Dressing: Maximal assistance Lower Body Dressing: Dependent Toileting: Dependent Toilet Transfer: Other (comment) (stedy)    Therapy/Group: Individual Therapy  Shuayb Schepers A Rayleen Wyrick 01/01/2021, 12:42 PM

## 2020-12-31 NOTE — Progress Notes (Signed)
[facility] PHYSICAL MEDICINE & REHABILITATION PROGRESS NOTE  Subjective/Complaints:   Pt reports also got lightheaded in lite gait, but got 200 mg amantadine yesterday AM-  BP went down somewhat, but better than day before.  PEED  on toilet this Am and emptied!!!  ROS:  Pt denies SOB, abd pain, CP, N/V/C/D, and vision changes   Objective: Vital Signs: Blood pressure 110/70, pulse (!) 102, temperature 99.1 F (37.3 C), temperature source Oral, resp. rate 16, height 5' 7.99" (1.727 m), weight 103.3 kg, SpO2 94 %. No results found. No results for input(s): WBC, HGB, HCT, PLT in the last 72 hours.    No results for input(s): NA, K, CL, CO2, GLUCOSE, BUN, CREATININE, CALCIUM in the last 72 hours.     Intake/Output Summary (Last 24 hours) at 12/31/2020 1904 Last data filed at 12/31/2020 1339 Gross per 24 hour  Intake 354 ml  Output --  Net 354 ml        Physical Exam: BP 110/70 (BP Location: Left Arm)   Pulse (!) 102   Temp 99.1 F (37.3 C) (Oral)   Resp 16   Ht 5' 7.99" (1.727 m)   Wt 103.3 kg   SpO2 94%   BMI 34.64 kg/m            General: awake, alert, appropriate, sitting up slightly in bed; daughter at bedside; NAD HENT: conjugate gaze; oropharynx moist CV: regular rate; no JVD Pulmonary: CTA B/L; no W/R/R- good air movement GI: soft, NT, ND, (+)BS Psychiatric: appropriate- brighter Neurological: alert- aphasia at sentence level Spasticity MAS of 1 only this AM after ROM  Has 2/5 R finger flexion and trace finger abduction- also 2/5 KE on R; also has some R hip external rotation- no DF yet Ext: no clubbing, cyanosis, or edema- no swelling seen  Hoffmans RUE- MAS of 2 in R elbow and wrist today- les sin shoulder and finger- ~ 1-1+.  Extremities: No clubbing, cyanosis, or edema Skin: No evidence of breakdown, no evidence of rash A and O x 3    She does some difficulty differentiating right from left. Motor: RLE: 1-2/5 proximal 0/5 distal- RUE  1/5 with early flexor patterns developing Sensation diminished to light touch RUE/RLE   Assessment/Plan: 1. Functional deficits which require 3+ hours per day of interdisciplinary therapy in a comprehensive inpatient rehab setting. Physiatrist is providing close team supervision and 24 hour management of active medical problems listed below. Physiatrist and rehab team continue to assess barriers to discharge/monitor patient progress toward functional and medical goals   Care Tool:  Bathing    Body parts bathed by patient: Right arm, Chest, Abdomen, Right upper leg, Left upper leg, Face   Body parts bathed by helper: Left arm, Front perineal area, Buttocks, Right lower leg, Left lower leg     Bathing assist Assist Level: Maximal Assistance - Patient 24 - 49%     Upper Body Dressing/Undressing Upper body dressing   What is the patient wearing?: Pull over shirt    Upper body assist Assist Level: Maximal Assistance - Patient 25 - 49%    Lower Body Dressing/Undressing Lower body dressing      What is the patient wearing?: Pants, Underwear/pull up     Lower body assist Assist for lower body dressing: Maximal Assistance - Patient 25 - 49%     Toileting Toileting    Toileting assist Assist for toileting: 2 Helpers     Transfers Chair/bed transfer  Transfers assist  Chair/bed transfer assist level: Minimal Assistance - Patient > 75%     Locomotion Ambulation   Ambulation assist   Ambulation activity did not occur: Safety/medical concerns  Assist level: 2 helpers Assistive device: Other (comment) (3 Musketeer) Max distance: 64ft   Walk 10 feet activity   Assist  Walk 10 feet activity did not occur: Safety/medical concerns  Assist level: Maximal Assistance - Patient 25 - 49% Assistive device: Lite Gait   Walk 50 feet activity   Assist Walk 50 feet with 2 turns activity did not occur: Safety/medical concerns  Assist level: Maximal Assistance -  Patient 25 - 49% Assistive device: Lite Gait    Walk 150 feet activity   Assist Walk 150 feet activity did not occur: Safety/medical concerns  Assist level: Maximal Assistance - Patient 25 - 49% Assistive device: Lite Gait    Walk 10 feet on uneven surface  activity   Assist Walk 10 feet on uneven surfaces activity did not occur: Safety/medical concerns         Wheelchair     Assist Is the patient using a wheelchair?: Yes Type of Wheelchair: Manual Wheelchair activity did not occur: Safety/medical concerns  Wheelchair assist level: Dependent - Patient 0%      Wheelchair 50 feet with 2 turns activity    Assist    Wheelchair 50 feet with 2 turns activity did not occur: Safety/medical concerns       Wheelchair 150 feet activity     Assist  Wheelchair 150 feet activity did not occur: Safety/medical concerns        Medical Problem List and Plan: 1.  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  Impaired mobility -DVT/anticoagulation:  Pharmaceutical: Continue Heparin  10/6- will change to Lovenox             -antiplatelet therapy: N/A 3. Pain Management: As needed meds 4. Anxiety/depression: Continue Xanax 0.5 mg 3 times daily, BuSpar 5 mg twice daily increase to TID , hydroxyzine 25 mg 4 times daily- d/c atarax due to urinary retention  9/29- will start Celexa 20 mg daily for severe new onset depression. 10/5- mood much better- con't regimen 10/12- feeling emotions more and feels like it's going well.              -antipsychotic agents:  N/A 5. Neuropsych: This patient is not fully capable of making decisions on her own behalf. 6. Skin/Wound Care: Routine skin checks 7. Fluids/Electrolytes/Nutrition: Routine in and outs 8.  Hypertension.  Continue Norvasc 5 mg daily, Cozaar 50 mg twice daily.               Monitor with increased mobility Vitals:   12/31/20 0600 12/31/20 1503  BP: (!) 129/117 110/70  Pulse: 70 (!) 102  Resp: 17 16  Temp: 98.9 F (37.2 C) 99.1 F (37.3 C)  SpO2:  94%    9/30- will con't Abd bin10/14- con't Florinef 0.3 mg daily; Midodrine 10 mg TID- off BP meds; con't ACE wraps, abd binder and TEDs- working  better today  9.  Hyperlipidemia: Lipitor 10.  COVID-19.  Completed course of remdesivir.  Currently completing course of dexamethasone 11.  Obesity.  BMI 39.52.  Dietary follow-up.  Encourage weight loss  9/29- BMI 34.97- improving.  12.  Leukocytosis-likely secondary to steroids             Afebrile, no signs/symptoms of infection  9/27- no labs yesterday- will check in AM  9/28- WBC down to 11k- con't to monitor weekly.  13.  Transaminitis  ALT elevated on 9/21, continue to monitor  9/27- will recheck CMP in AM  9/29- LFTs 21/60- doing better- con't to monitor  10/7- resolved 14. Orthostatic hypotension: continue TEDs, added abdominal binder in case this is not enough. Improved  reduce cozaar to 25mg  daily , amlodipine to 2.5mg  15.  Urinary retention add urecholine increase to 10mg  QID , now off anticholinergic agents , BP too low for flomax also with orthostasis   10/14- voided x1 this AM and emptied- con't trying 16. Nausea  9/27- will schedule Zofran 4 mg at 6am daily to help with nausea and monitor   9/28- doing better- con't regimen.  17. Spasticity  9/30- developing low level spasticity in RUE- educated pt and daughter on spasticity and how progresses; what it means- that can masquerade as strength at low levels, etc  10/13- canot start Spasitcity meds due to risk of lowering BP-  went over ROM exercises for daughter to help-demonstrated how to do, esp for hip and shoulder. Will schedule for Dr Ranell Patrick to do Botox of RUE after d/c- 300 units RUE- since spasitcity progressively worse.   10/14- ROM has made a big difference- con't multiple times per day.  18. Aphasia/poor motor planning  10/5- adding Amantadine 100 mg daily-   10/7- appears to be doing better with aphasia- will d/w therapy if motor planning is any better?  10/12- increase Amantadine to 200 mg daily.   10/13- will decrease back to 100 mg due to low BP episode.  19 Post nasal drip  10/7- addedFlonase for sore throat/post nasal drip- if need be, might need Claritin. 10/10- doing better  20. Dispo  10/12- ground pass- wrote for pt.     LOS: 24 days A FACE TO FACE EVALUATION WAS PERFORMED  Ebrahim Deremer 12/31/2020, 7:04 PM

## 2020-12-31 NOTE — Progress Notes (Signed)
Physical Therapy Session Note  Patient Details  Name: RANDIE BLOODGOOD MRN: [id number] Date of Birth: Nov 16, 1965  Today's Date: 12/31/2020 PT Individual Time: 0900-1000 PT Individual Time Calculation (min): 60 min   Short Term Goals: Week 1:  PT Short Term Goal 1 (Week 1): Pt will initiate gait training PT Short Term Goal 1 - Progress (Week 1): Met PT Short Term Goal 2 (Week 1): Pt will perform STS with mod A of 1 PT Short Term Goal 2 - Progress (Week 1): Met PT Short Term Goal 3 (Week 1): Pt will tolerate OOB sitting between therapy sessions PT Short Term Goal 3 - Progress (Week 1): Met Week 2:  PT Short Term Goal 1 (Week 2): Pt will demonstrate STS with RW and mod A PT Short Term Goal 1 - Progress (Week 2): Met PT Short Term Goal 2 (Week 2): Pt's will perform standing mobility x 2 min without orthostatic symptoms. PT Short Term Goal 2 - Progress (Week 2): Met PT Short Term Goal 3 (Week 2): Pt will perform gait with max A x 10 ft PT Short Term Goal 3 - Progress (Week 2): Met PT Short Term Goal 4 (Week 2): Pt will demo standing activity x 5 min wqithout orthostatic sx. PT Short Term Goal 5 (Week 2): Pt will tolerate sitting in manual w/c x 1 hour Week 3:  PT Short Term Goal 1 (Week 3): Pt will initiate gait training with RW PT Short Term Goal 1 - Progress (Week 3): Discontinued (comment) (Not appropriate at this time) PT Short Term Goal 2 (Week 3): Pt will demo dynamic standing balance with mod A PT Short Term Goal 2 - Progress (Week 3): Met  Skilled Therapeutic Interventions/Progress Updates:    pt received in bed and agreeable to therapy. No complaint of pain. Supine>sit with min A for trunk control, sit>supine with supervision! Pt's daughter assisted with squat pivot transfer to L side with sling in place to control RUE, therapist provided min A for safety. Pt propelled chair with LLE x 150 ft, continues to have difficulty coordinating movement when adding LUE. Donned DF ace wrap  with tot A. Gait training x 170 ft with lite gait and assist to advance RLE and stabilize R side. Pt prefers to place R UE on lite gait handle, would benefit from ace wrap or coban to secure. Pt took standing breaks as needed to regain balance at time but demoed increased gait speed and improved advancement of RLE. Pt returned to bed with min A squat pivot transfer to EOB and supervision sit>supine. Pt was left with all needs in reach and alarm active.   Saebo placed on ant tib for improved DF with functional mobility, x 60 min. No skin irritation noted before or after treatment. Setting as follows: Saebo Stim One 330 pulse width 35 Hz pulse rate On 8 sec/ off 8 sec Ramp up/ down 2 sec Symmetrical Biphasic wave form  Max intensity 151mA at 500 Ohm load   Therapy Documentation Precautions:  Precautions Precautions: Fall Precaution Comments: R hemi, inattention. SBP parameters <160 per neuro on acute Restrictions Weight Bearing Restrictions: No   Therapy/Group: Individual Therapy  Mickel Fuchs 12/31/2020, 4:29 PM

## 2021-01-01 DIAGNOSIS — G903 Multi-system degeneration of the autonomic nervous system: Secondary | ICD-10-CM

## 2021-01-01 DIAGNOSIS — R110 Nausea: Secondary | ICD-10-CM

## 2021-01-01 DIAGNOSIS — Z8679 Personal history of other diseases of the circulatory system: Secondary | ICD-10-CM

## 2021-01-01 DIAGNOSIS — G8110 Spastic hemiplegia affecting unspecified side: Secondary | ICD-10-CM

## 2021-01-01 NOTE — Progress Notes (Signed)
[place] [facility] PHYSICAL MEDICINE & REHABILITATION PROGRESS NOTE  Subjective/Complaints: Patient seen laying in bed this morning.  She states she slept well overnight.  She is pleased with her improvement, particularly with bowel/bladder function.  She remembers me from previous encounter.  ROS: Denies CP, SOB, N/V/D  Objective: Vital Signs: Blood pressure 112/69, pulse 84, temperature 98.3 F (36.8 C), temperature source Oral, resp. rate 18, height 5' 7.99" (1.727 m), weight 103.3 kg, SpO2 94 %. No results found. No results for input(s): WBC, HGB, HCT, PLT in the last 72 hours.    No results for input(s): NA, K, CL, CO2, GLUCOSE, BUN, CREATININE, CALCIUM in the last 72 hours.     Intake/Output Summary (Last 24 hours) at 01/01/2021 1141 Last data filed at 01/01/2021 0800 Gross per 24 hour  Intake 218 ml  Output --  Net 218 ml         Physical Exam: BP 112/69 (BP Location: Left Arm)   Pulse 84   Temp 98.3 F (36.8 C) (Oral)   Resp 18   Ht 5' 7.99" (1.727 m)   Wt 103.3 kg   SpO2 94%   BMI 34.64 kg/m  Constitutional: No distress . Vital signs reviewed. HENT: Normocephalic.  Atraumatic. Eyes: EOMI. No discharge. Cardiovascular: No JVD.  RRR. Respiratory: Normal effort.  No stridor.  Bilateral clear to auscultation. GI: Non-distended.  BS +. Skin: Warm and dry.  Intact. Psych: Normal mood.  Normal behavior. Musc: No edema in extremities.  No tenderness in extremities. Neuro: Alert Motor: RUE: Shoulder abduction, elbow flexion/extension 0/5.  Hand grip 3/5 RLE: Hip flexion, knee extension 3 -/5, ankle dorsiflexion 0/5  Right lower extremity plantar flexion, knee extension 3/5, ankle dorsiflexion 0/5 Increased tone RUE Sensation diminished to light touch RUE/RLE, improving  Assessment/Plan: 1. Functional deficits which require 3+ hours per day of interdisciplinary therapy in a comprehensive inpatient rehab setting. Physiatrist is providing close team supervision and  24 hour management of active medical problems listed below. Physiatrist and rehab team continue to assess barriers to discharge/monitor patient progress toward functional and medical goals   Care Tool:  Bathing    Body parts bathed by patient: Right arm, Chest, Abdomen, Right upper leg, Left upper leg, Face   Body parts bathed by helper: Left arm, Front perineal area, Buttocks, Right lower leg, Left lower leg     Bathing assist Assist Level: Maximal Assistance - Patient 24 - 49%     Upper Body Dressing/Undressing Upper body dressing   What is the patient wearing?: Pull over shirt    Upper body assist Assist Level: Maximal Assistance - Patient 25 - 49%    Lower Body Dressing/Undressing Lower body dressing      What is the patient wearing?: Pants, Underwear/pull up     Lower body assist Assist for lower body dressing: Maximal Assistance - Patient 25 - 49%     Toileting Toileting    Toileting assist Assist for toileting: 2 Helpers     Transfers Chair/bed transfer  Transfers assist     Chair/bed transfer assist level: Minimal Assistance - Patient > 75%     Locomotion Ambulation   Ambulation assist   Ambulation activity did not occur: Safety/medical concerns  Assist level: 2 helpers Assistive device: Other (comment) (3 Musketeer) Max distance: 68ft   Walk 10 feet activity   Assist  Walk 10 feet activity did not occur: Safety/medical concerns  Assist level: Maximal Assistance - Patient 25 - 49% Assistive device: Lite Gait  Walk 50 feet activity   Assist Walk 50 feet with 2 turns activity did not occur: Safety/medical concerns  Assist level: Maximal Assistance - Patient 25 - 49% Assistive device: Lite Gait    Walk 150 feet activity   Assist Walk 150 feet activity did not occur: Safety/medical concerns  Assist level: Maximal Assistance - Patient 25 - 49% Assistive device: Lite Gait    Walk 10 feet on uneven surface  activity   Assist  Walk 10 feet on uneven surfaces activity did not occur: Safety/medical concerns         Wheelchair     Assist Is the patient using a wheelchair?: Yes Type of Wheelchair: Manual Wheelchair activity did not occur: Safety/medical concerns  Wheelchair assist level: Dependent - Patient 0%      Wheelchair 50 feet with 2 turns activity    Assist    Wheelchair 50 feet with 2 turns activity did not occur: Safety/medical concerns       Wheelchair 150 feet activity     Assist  Wheelchair 150 feet activity did not occur: Safety/medical concerns        Medical Problem List and Plan: 1.  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  Impaired mobility -DVT/anticoagulation:  Pharmaceutical: Continue Heparin  Lovenox             -antiplatelet therapy: N/A 3. Pain Management: As needed meds 4. Anxiety/depression: Continue Xanax 0.5 mg 3 times daily, BuSpar 5 mg twice daily increase to TID , hydroxyzine 25 mg 4 times daily- d/c atarax due to urinary retention  Celexa 20 mg daily for severe new onset depression.             -antipsychotic agents: N/A 5. Neuropsych: This patient is not fully capable of making decisions on her own behalf. 6. Skin/Wound Care: Routine skin checks 7. Fluids/Electrolytes/Nutrition: Routine in and outs 8.  History of hypertension, now with hypotension.               Monitor with increased mobility Vitals:   12/31/20 1928 01/01/21 0420  BP: (!) 100/59 112/69  Pulse: 61 84  Resp: 18 18  Temp: 98 F (36.7 C) 98.3 F (36.8 C)  SpO2:  94%    Con't Florinef 0.3 mg daily; Midodrine 10 mg TID- off BP meds; con't ACE wraps, abd binder and TEDs  Relatively soft on 10/15 9.  Hyperlipidemia: Lipitor 10.  COVID-19.  Completed course of remdesivir.  Currently completing course of dexamethasone 11.  Obesity.  BMI 39.52.   Dietary follow-up.  Encourage weight loss 12.  Leukocytosis resolved 13.  Transaminitis: Resolved 14. Orthostatic hypotension: continue TEDs, added abdominal binder in case this is not enough.   Of blood pressure medications, improving on 10/15 15.  Urinary retention add urecholine increase to 10mg  QID , now off anticholinergic agents , BP too low for flomax also with orthostasis   Improving 16. Nausea  Schedule Zofran 4 mg at 6am daily to help with nausea and monitor   Controlled, will consider weaning 17. Spasticity  Will schedule for Dr Ranell Patrick to do Botox of RUE after d/c- 300 units RUE- since spasitcity progressively worse.   Proving with range of motion 18. Aphasia/poor motor planning  Amantadine 100 mg daily 19 Post nasal drip  10/7- added Flonase for sore throat/post nasal drip- if need be, might need Claritin.  LOS: 25 days A FACE TO FACE EVALUATION WAS PERFORMED  Faythe Heitzenrater Lorie Phenix 01/01/2021, 11:41 AM

## 2021-01-01 NOTE — Progress Notes (Signed)
Speech Language Pathology Daily Session Note  Patient Details  Name: Felicia Chen MRN: [id number] Date of Birth: December 10, 1965  Today's Date: 01/01/2021 SLP Individual Time: 6606-0045 SLP Individual Time Calculation (min): 45 min  Short Term Goals: Week 4: SLP Short Term Goal 1 (Week 4): STG=LTG due to short ELOS (d/c 10/19)  Skilled Therapeutic Interventions: Skilled treatment session focused on cognitive-linguistic goals. Upon arrival, patient was able to appropriately recall goals of skilled SLP intervention and report current progress. SLP facilitated session with a complex and abstract verbal description task in which she was Mod I for word-finding and for following the procedures of the task. SLP also facilitated session with a complex money management task. Patient was Mod I for verbally organizing the information and required extra time and Min visual cues for problem solving the mathematical section for the balance. The task was left with the patient for her and her daughter's to complete together. Patient left upright in bed with alarm on and all needs within reach. Continue with current plan of care.      Pain Pain Assessment Pain Scale: 0-10 Pain Score: 2  Pain Type: Acute pain Pain Location: Generalized Pain Orientation: Right;Left Pain Descriptors / Indicators: Sore Pain Frequency: Intermittent Pain Onset: On-going Patients Stated Pain Goal: 1 Pain Intervention(s): Medication (See eMAR)  Therapy/Group: Individual Therapy  Clydell Alberts 01/01/2021, 11:46 AM

## 2021-01-02 NOTE — Progress Notes (Signed)
Patient has a large brown stool and voided, assisted to BR and back to bed tolerated well

## 2021-01-03 NOTE — Discharge Summary (Signed)
Physician Discharge Summary  Patient ID: Felicia Chen MRN: [id number] DOB/AGE: [birth year] 55 y.o.  Admit date: 12/07/2020 Discharge date: 01/05/2021  Discharge Diagnoses:  Principal Problem:   ICH (intracerebral hemorrhage) ([place]) Active Problems:   Transaminitis   Right hemiplegia (HCC)   Nausea without vomiting   Spastic hemiparesis (HCC)   History of hypertension   Neurogenic orthostatic hypotension (HCC) DVT prophylaxis Hyperlipidemia Recent COVID-19 Obesity Mood stabilization Neurogenic bladder  Discharged Condition: Stable  Significant Diagnostic Studies: No results found.  Labs:  Basic Metabolic Panel: No results for input(s): NA, K, CL, CO2, GLUCOSE, BUN, CREATININE, CALCIUM, MG, PHOS in the last 168 hours.  CBC: No results for input(s): WBC, NEUTROABS, HGB, HCT, MCV, PLT in the last 168 hours.  CBG: No results for input(s): GLUCAP in the last 168 hours.  Family history.  Positive for hypertension as well as hyperlipidemia.  Denies any colon cancer esophageal cancer or rectal cancer  Brief HPI:   Felicia Chen is a 55 y.o. right-handed female with history of hyperlipidemia, obesity with BMI 39.5 and recent COVID-19.  Per chart review patient lives with spouse.  1 level home 2 steps to entry.  Independent prior to admission.  Presented 12/01/2020 with increasing headache right side weakness and neglect.  CT angiogram head and neck showed a 60 cc left frontal parietal lobar hematoma with regional subarachnoid extension.  No spot sign or discrete vascular lesion underlying the lobar hematoma.  Mild atherosclerosis in the neck.  CT venogram of the head negative for dural venous sinus thrombosis.  MRI showed overall stable size of large left parietal intraparenchymal hematoma with subarachnoid hemorrhage overlying the bilateral cerebral hemispheres and cerebellum.  No evidence of intraventricular extension or hemorrhage.  EEG negative for seizures.   Echocardiogram with ejection fraction of 60 to 65% no wall motion abnormalities grade 1 diastolic dysfunction.  Patient did experience hypoxic respiratory failure on admission and monitored by critical care medicine.  Admission chemistries unremarkable except potassium 3.4 alcohol negative urine drug screen negative.  Hypertonic saline was initiated.  Placed on Cleviprex for blood pressure control.  Neurology follow-up with conservative care.  Hospital course patient did have a fall while using bedside commode 12/05/2020 sustaining right facial and eyelid bruising with swelling and cranial CT scan showed no progression of the left cerebral hematoma with mild subarachnoid extension.  No hydrocephalus or midline shift.  She was cleared to begin subcutaneous heparin for DVT prophylaxis.  In regards to patient's [id number] she did require complete course of remdesivir and weaned from dexamethasone.  Therapy evaluations completed due to patient's right side weakness and sensory deficits was admitted for a comprehensive rehab program.   Hospital Course: Felicia Chen was admitted to rehab 12/07/2020 for inpatient therapies to consist of PT, ST and OT at least three hours five days a week. Past admission physiatrist, therapy team and rehab RN have worked together to provide customized collaborative inpatient rehab.  Pertaining to patient's left frontal ICH and small SAH etiology hypertension versus small vessel event remained stable conservative care follow-up per neurology services.  She had been cleared to begin subcutaneous Lovenox for DVT prophylaxis no bleeding episodes.  Mood stabilization with the use of Xanax, BuSpar 5 mg 3 times daily as well as Celexa.  She was using trazodone to help aid in sleep.  Emotional support provided with follow-up per neuropsychology.  Bouts of orthostasis rebounded nicely with the use of Florinef as well as ProAmatine.  Neurogenic bladder and Urecholine  initiated consideration  made to add Flomax but held due to bouts of orthostasis.  Lipitor ongoing for hyperlipidemia.  Noted hospital course COVID-19 completed course of remdesivir as well as dexamethasone.  Noted obesity BMI 39.52 dietary follow-up.   Blood pressures were monitored on TID basis and soft and monitored     Rehab course: During patient's stay in rehab weekly team conferences were held to monitor patient's progress, set goals and discuss barriers to discharge. At admission, patient required max assist side-lying to sitting moderate assist for rolling +2 physical assist sit to stand moderate assist squat pivot transfers  Physical exam.  Blood pressure 128/72 pulse 84 temperature 99 respirations 18 oxygen saturation 95% room air Constitutional.  No acute distress HEENT.  Comments.  Right periorbital edema and ecchymosis Eyes.  Pupils round and reactive to light no discharge without nystagmus Neck.  Supple nontender no JVD without thyromegaly Cardiac regular rate rhythm not extra sounds or murmur heard Abdomen.  Soft nontender positive bowel sounds without rebound Respiratory effort normal no respiratory distress without wheeze Musculoskeletal.  Normal range of motion Neurologic.  Alert makes eye contact with examiner.  Follows simple commands.  Provides her name and age.  She did have some difficulty differentiating right from left.  Motor.  Right upper extremity/right lower extremity 0/5 proximal distal.  Sensation diminished to light touch right upper right lower extremity  He/She  has had improvement in activity tolerance, balance, postural control as well as ability to compensate for deficits. He/She has had improvement in functional use RUE/LUE  and RLE/LLE as well as improvement in awareness.  Supine to sit with minimal assist for trunk control sit to supine with supervision.  Patient's family did assist with squat pivot transfers and ongoing family teaching.  Propelled her wheelchair with left lower  extremity continued have some difficulty coordinating movement when adding left upper extremity.  Gait training 170 feet with light gait and assist to advance right lower extremity.  Patient could return to bed with minimal assist squat pivot transfers to edge of bed and supervision sit to supine.  Moderate assist upper body bathing max is lower body bathing max is upper body dressing dependent lower body dressing.  Speech therapy follow-up patient was able to appropriately recall goals of skilled SLP intervention report current prognosis.  Facilitated sessions with complex and abstract verbal description task in which she was modified independent with word finding and for following procedure of the task.  Patient is modified independent for verbally organizing information required extra time and minimal cues for problem solving and mathematical sections for the balance.  Full family teaching completed plan discharged to home       Disposition: Discharged home    Diet: Regular  Special Instructions: No driving smoking or alcohol  Medications at discharge 1.  Tylenol as needed 2.  Xanax 0.5 mg p.o. 3 times daily 3.  Amantadine 100 mg p.o. daily 4.  Lipitor 80 mg p.o. daily 5.  Urecholine 10 mg 4 times daily taper as directed 6.  BuSpar 5 mg p.o. 3 times daily 7.  Celexa 20 mg p.o. daily 8.  Florinef 0.3 mg p.o. daily 9.  ProAmatine 10 mg p.o. 3 times daily 10.  Protonix 40 mg p.o. daily 11.  MiraLAX daily hold for loose stools 12.  Senokot S2 tabs p.o. twice daily 13.  Trazodone 50 mg p.o. nightly  30-35 minutes were spent completing discharge summary and discharge planning  Discharge Instructions  Ambulatory referral to Neurology   Complete by: As directed    An appointment is requested in approximately: 4 weeks [place]   Ambulatory referral to Physical Medicine Rehab   Complete by: As directed    Follow up for BOTOX RUE 300 units   Ambulatory referral to Physical Medicine Rehab    Complete by: As directed    Moderate complexity follow-up 1 to 2 weeks ICH        Follow-up Information     Lovorn, Jinny Blossom, MD Follow up.   Specialty: Physical Medicine and Rehabilitation Why: Office to call for appointment Contact information: [address] [place] [address] [phone]         Tobin Chad, PA-C Follow up.          Izora Ribas, MD Follow up.   Specialty: Physical Medicine and Rehabilitation Why: Follow  up for botox RUE 300 units Contact information: [address] [place] [address] [phone]                 Signed: Lavon Paganini Brimhall Nizhoni 01/05/2021, 5:15 AM

## 2021-01-03 NOTE — Progress Notes (Signed)
Speech Language Pathology Daily Session Note  Patient Details  Name: Felicia Chen MRN: [id number] Date of Birth: November 05, 1965  Today's Date: 01/03/2021 SLP Individual Time: 1000-1100 SLP Individual Time Calculation (min): 60 min  Short Term Goals: Week 4: SLP Short Term Goal 1 (Week 4): STG=LTG due to short ELOS (d/c 10/19)  Skilled Therapeutic Interventions: Pt seen for skilled ST with focus on cognitive goals and family education. Pt's husband and 2 daughters in room, engaged and demonstrating good understanding of training and education provided re: memory strategies, attention strategies, safety precautions and appropriate cognitive stimulation at home. SLP re-administering the SLUMS with pt score 23/30 (improved from 10/30 at evaluation) with deficits remaining in mathematics, working memory, short term recall, and verbal fluency. Pt continues to report frustration with cognitive deficits but is aware of improvements during rehab stay. ST recommends ongoing services at discharge to maximize cognitive abilities, pt and family agreeable and motivated. Pt and family with no further questions at this time, pt left in wheelchair with family present and all needs met. Cont ST POC.   Pain Pain Assessment Pain Scale: 0-10 Pain Score: 0-No pain  Therapy/Group: Individual Therapy  Dewaine Conger 01/03/2021, 10:59 AM

## 2021-01-03 NOTE — Progress Notes (Addendum)
Occupational Therapy Session Note  Patient Details  Name: Felicia Chen MRN: [id number] Date of Birth: 10/30/1965  Today's Date: 01/03/2021 OT Individual Time: 2449-7530 and 0511-0211  OT Individual Time Calculation (min): 59 min and 10 min Missed 35 mins of OT 2/2 fatigue   Short Term Goals: Week 3:  OT Short Term Goal 1 (Week 3): Pt will perform sit <> stands at [place] with Min A in prep for ADL OT Short Term Goal 1 - Progress (Week 3): Progressing toward goal OT Short Term Goal 2 (Week 3): Pt will perform squat pivot transfer to BSC/toilet with Min A OT Short Term Goal 2 - Progress (Week 3): Met OT Short Term Goal 3 (Week 3): Pt will perform LB dressing with Mod A OT Short Term Goal 3 - Progress (Week 3): Progressing toward goal OT Short Term Goal 4 (Week 3): Pt will improve awareness attending to RUE during ADL tasks in 3 consecutive sessions OT Short Term Goal 4 - Progress (Week 3): Progressing toward goal Week 4:  OT Short Term Goal 1 (Week 4): STGs = LTGs at [place] Therapeutic Interventions/Progress Updates:    Pt greeted at time of session reclined in bed with family present, daughters [name] and [name] as well as husband [name] for hands on family training. Verbally reviewed ADL skill performance and techniques as pt and family wanting to focus on ADL transfers and problem solving. Family donning TEDS with cues for problem solving. Supine > sit form flat bed Min A. Squat pivot bed <> wheelchair with CGA/Min for demonstration toward L side. Focused on hands on training with husband and daughters to assist with transfers, cues for body mechanics, blocking R knee, and placement on gait belt. Family also practicing/demonstrating how to don/doff RUE arm sling and set up transfers. Squat pivot hemiheight w/c <> DABSC with Mod/Max intially with difficulty clearing commode, but did much better with over back technique. Back to w/c from [facility] with CGA/Min. Set up call bell in reach  and family present for next family training.   Session 2: Pt greeted early that scheduled session 2/2 schedule changes and just getting back to bed after toileting with nursing staff. Pt requesting a few minutes to rest after long day full of family ed/training. Returned at pt's scheduled therapy time and pt remained very fatigue, politely requesting to rest and declined OT session. Pt very fatigued from many transfers and training today. Provided pt with hand outs with print outs for hair washing board for family to use at sink level and for bed rail that pt can add to home bed set up. Pt grateful for hand outs. Pt left bed level resting very fatigued, missed 35 mins of OT.  Therapy Documentation Precautions:  Precautions Precautions: Fall Precaution Comments: R hemi, inattention. SBP parameters <160 per neuro on acute Restrictions Weight Bearing Restrictions: No    Therapy/Group: Individual Therapy  [name] 01/03/2021, 7:26 AM

## 2021-01-03 NOTE — Progress Notes (Signed)
Physical Therapy Session Note  Patient Details  Name: Felicia Chen MRN: [id number] Date of Birth: 11/16/1965  Today's Date: 01/03/2021 PT Individual Time: 1100-1202 PT Individual Time Calculation (min): 62 min   Short Term Goals: Week 1:  PT Short Term Goal 1 (Week 1): Pt will initiate gait training PT Short Term Goal 1 - Progress (Week 1): Met PT Short Term Goal 2 (Week 1): Pt will perform STS with mod A of 1 PT Short Term Goal 2 - Progress (Week 1): Met PT Short Term Goal 3 (Week 1): Pt will tolerate OOB sitting between therapy sessions PT Short Term Goal 3 - Progress (Week 1): Met Week 2:  PT Short Term Goal 1 (Week 2): Pt will demonstrate STS with RW and mod A PT Short Term Goal 1 - Progress (Week 2): Met PT Short Term Goal 2 (Week 2): Pt's will perform standing mobility x 2 min without orthostatic symptoms. PT Short Term Goal 2 - Progress (Week 2): Met PT Short Term Goal 3 (Week 2): Pt will perform gait with max A x 10 ft PT Short Term Goal 3 - Progress (Week 2): Met PT Short Term Goal 4 (Week 2): Pt will demo standing activity x 5 min wqithout orthostatic sx. PT Short Term Goal 5 (Week 2): Pt will tolerate sitting in manual w/c x 1 hour Week 3:  PT Short Term Goal 1 (Week 3): Pt will initiate gait training with RW PT Short Term Goal 1 - Progress (Week 3): Discontinued (comment) (Not appropriate at this time) PT Short Term Goal 2 (Week 3): Pt will demo dynamic standing balance with mod A PT Short Term Goal 2 - Progress (Week 3): Met Week 4:  PT Short Term Goal 1 (Week 4): =LTGS d/t ELOS  Skilled Therapeutic Interventions/Progress Updates:    Pt seated in w/c on arrival and agreeable to therapy. No complaint of pain. Pt's husband, [name], and daughters, [name] and [name], present throughout session for family education. Mod A w/c hemi propulsion with LLE only x 150 ft. Car transfer with min A x 4 with therapist and each family member. Pt and family educated on positioning,  body mechanics, and technique for transfer. Transitioned to working bed chair transfer to elevated mat table toward R side. When going to elevated surface, pt had most success with standing before pivoting to sit. Performed transfer x 5. Required incr cueing d/t slightly different technique. Pt returned to room and performed same stand pivot transfer with [name] to return to bed. Bed mobility with supervision. Pt remained in bed with family members present at close of session and was left with all needs in reach and alarm active.   Therapy Documentation Precautions:  Precautions Precautions: Fall Precaution Comments: R hemi, inattention. SBP parameters <160 per neuro on acute Restrictions Weight Bearing Restrictions: No   Therapy/Group: Individual Therapy  Olivia C Brittain 01/03/2021, 12:14 PM  

## 2021-01-03 NOTE — Progress Notes (Signed)
PHYSICAL MEDICINE & REHABILITATION PROGRESS NOTE  Subjective/Complaints:  Pt reports voiding- and is emptying- having regular Bms as well on commode-   Asking about exposure to COVID after d/c- explained covid increases Stroke risk- would try to avoid and can get booster after 11/1.   ROS:  Pt denies SOB, abd pain, CP, N/V/C/D, and vision changes   Objective: Vital Signs: Blood pressure 106/67, pulse 65, temperature 98.4 F (36.9 C), temperature source Oral, resp. rate 18, height 5' 7.99" (1.727 m), weight 103.3 kg, SpO2 90 %. No results found. No results for input(s): WBC, HGB, HCT, PLT in the last 72 hours.    No results for input(s): NA, K, CL, CO2, GLUCOSE, BUN, CREATININE, CALCIUM in the last 72 hours.    No intake or output data in the 24 hours ending 01/03/21 1407      Physical Exam: BP 106/67 (BP Location: Left Arm)   Pulse 65   Temp 98.4 F (36.9 C) (Oral)   Resp 18   Ht 5' 7.99" (1.727 m)   Wt 103.3 kg   SpO2 90%   BMI 34.64 kg/m     General: awake, alert, appropriate, laying in bed; daughter Joellen Jersey on phone; NAD HENT: conjugate gaze; oropharynx moist CV: regular rate; no JVD Pulmonary: CTA B/L; no W/R/R- good air movement GI: soft, NT, ND, (+)BS Psychiatric: appropriate Neurological: alert- aphasia MUCH better - MAS of 1 in RUE- appears to have already done some ROM.   Skin: Warm and dry.  Intact. Musc: No edema in extremities.  No tenderness in extremities. Neuro: Alert Motor: RUE: Shoulder abduction, elbow flexion/extension 0/5.  Hand grip 3/5 RLE: Hip flexion, knee extension 3 -/5, ankle dorsiflexion 0/5  Right lower extremity plantar flexion, knee extension 3/5, ankle dorsiflexion 0/5 Increased tone RUE Sensation diminished to light touch RUE/RLE, improving  Assessment/Plan: 1. Functional deficits which require 3+ hours per day of interdisciplinary therapy in a comprehensive inpatient rehab setting. Physiatrist is providing  close team supervision and 24 hour management of active medical problems listed below. Physiatrist and rehab team continue to assess barriers to discharge/monitor patient progress toward functional and medical goals   Care Tool:  Bathing    Body parts bathed by patient: Right arm, Chest, Abdomen, Right upper leg, Left upper leg, Face   Body parts bathed by helper: Left arm, Front perineal area, Buttocks, Right lower leg, Left lower leg     Bathing assist Assist Level: Maximal Assistance - Patient 24 - 49%     Upper Body Dressing/Undressing Upper body dressing   What is the patient wearing?: Pull over shirt    Upper body assist Assist Level: Maximal Assistance - Patient 25 - 49%    Lower Body Dressing/Undressing Lower body dressing      What is the patient wearing?: Pants, Underwear/pull up     Lower body assist Assist for lower body dressing: Maximal Assistance - Patient 25 - 49%     Toileting Toileting    Toileting assist Assist for toileting: 2 Helpers     Transfers Chair/bed transfer  Transfers assist     Chair/bed transfer assist level: Minimal Assistance - Patient > 75%     Locomotion Ambulation   Ambulation assist   Ambulation activity did not occur: Safety/medical concerns  Assist level: 2 helpers Assistive device: Other (comment) (3 Musketeer) Max distance: 44ft   Walk 10 feet activity   Assist  Walk 10 feet activity did not occur: Safety/medical concerns  Assist  level: Maximal Assistance - Patient 25 - 49% Assistive device: Lite Gait   Walk 50 feet activity   Assist Walk 50 feet with 2 turns activity did not occur: Safety/medical concerns  Assist level: Maximal Assistance - Patient 25 - 49% Assistive device: Lite Gait    Walk 150 feet activity   Assist Walk 150 feet activity did not occur: Safety/medical concerns  Assist level: Maximal Assistance - Patient 25 - 49% Assistive device: Lite Gait    Walk 10 feet on uneven  surface  activity   Assist Walk 10 feet on uneven surfaces activity did not occur: Safety/medical concerns         Wheelchair     Assist Is the patient using a wheelchair?: Yes Type of Wheelchair: Manual Wheelchair activity did not occur: Safety/medical concerns  Wheelchair assist level: Dependent - Patient 0%      Wheelchair 50 feet with 2 turns activity    Assist    Wheelchair 50 feet with 2 turns activity did not occur: Safety/medical concerns       Wheelchair 150 feet activity     Assist  Wheelchair 150 feet activity did not occur: Safety/medical concerns        Medical Problem List and Plan: 1.  Right side hemiplegia, now with spasticity secondary to left frontal lobe ICH and small SAH etiology hypertension versus small vessel event  WHO/PRAFO nightly  Continue CIR- PT, OT and SLP 2.  Impaired mobility -DVT/anticoagulation:  Pharmaceutical: Continue Heparin  Lovenox             -antiplatelet therapy: N/A 3. Pain Management: As needed meds 4. Anxiety/depression: Continue Xanax 0.5 mg 3 times daily, BuSpar 5 mg twice daily increase to TID , hydroxyzine 25 mg 4 times daily- d/c atarax due to urinary retention  Celexa 20 mg daily for severe new onset depression.  10/17- doing better- brighter affect- con't regimen             -antipsychotic agents: N/A 5. Neuropsych: This patient is not fully capable of making decisions on her own behalf. 6. Skin/Wound Care: Routine skin checks 7. Fluids/Electrolytes/Nutrition: Routine in and outs 8.  History of hypertension, now with hypotension.               Monitor with increased mobility Vitals:   01/02/21 1541 01/02/21 2021  BP: 107/71 106/67  Pulse: 69 65  Resp: 18 18  Temp: 98.7 F (37.1 C) 98.4 F (36.9 C)  SpO2: 97% 90%    Con't Florinef 0.3 mg daily; Midodrine 10 mg TID- off BP meds; con't ACE wraps, abd binder and TEDs  10/17- cannot increase meds yet- will need to conitnue long term 9.   Hyperlipidemia: Lipitor 10.  COVID-19.  Completed course of remdesivir.  Currently completing course of dexamethasone  10/17- cannot have covid vaccine until 11/1 11.  Obesity.  BMI 39.52.  Dietary follow-up.  Encourage weight loss 12.  Leukocytosis resolved 13.  Transaminitis: Resolved 14. Orthostatic hypotension: continue TEDs, added abdominal binder in case this is not enough.   Of blood pressure medications, improving on 10/15 15.  Urinary retention add urecholine increase to 10mg  QID , now off anticholinergic agents , BP too low for flomax also with orthostasis   Improving  10/17- still voiding- con't regimen 16. Nausea  Schedule Zofran 4 mg at 6am daily to help with nausea and monitor   Controlled, will consider weaning 17. Spasticity  Will schedule for Dr Ranell Patrick to do Botox  of RUE after d/c- 300 units RUE- since spasitcity progressively worse.   Proving with range of motion 18. Aphasia/poor motor planning  Amantadine 100 mg daily 19 Post nasal drip  10/7- added Flonase for sore throat/post nasal drip- if need be, might need Claritin.   LOS: 27 days A FACE TO FACE EVALUATION WAS PERFORMED  Giann Obara 01/03/2021, 2:07 PM

## 2021-01-04 ENCOUNTER — Other Ambulatory Visit (HOSPITAL_COMMUNITY): Payer: Self-pay

## 2021-01-04 MED ORDER — SENNOSIDES-DOCUSATE SODIUM 8.6-50 MG PO TABS: 2.0000 | ORAL_TABLET | Freq: Two times a day (BID) | ORAL | Status: DC

## 2021-01-04 MED ORDER — PANTOPRAZOLE SODIUM 40 MG PO TBEC
40.0000 mg | DELAYED_RELEASE_TABLET | Freq: Every day | ORAL | 0 refills | Status: DC
  Filled 2021-01-04: qty 30, 30d supply, fill #0

## 2021-01-04 MED ORDER — BUSPIRONE HCL 5 MG PO TABS
5.0000 mg | ORAL_TABLET | Freq: Three times a day (TID) | ORAL | 0 refills | Status: DC
  Filled 2021-01-04: qty 90, 30d supply, fill #0

## 2021-01-04 MED ORDER — MIDODRINE HCL 10 MG PO TABS
10.0000 mg | ORAL_TABLET | Freq: Three times a day (TID) | ORAL | 0 refills | Status: DC
  Filled 2021-01-04: qty 90, 30d supply, fill #0

## 2021-01-04 MED ORDER — POLYETHYLENE GLYCOL 3350 17 G PO PACK: 17.0000 g | PACK | Freq: Every day | ORAL | 0 refills | Status: DC

## 2021-01-04 MED ORDER — ALPRAZOLAM 0.5 MG PO TABS
0.5000 mg | ORAL_TABLET | Freq: Three times a day (TID) | ORAL | 0 refills | Status: DC
Start: 2021-01-04 — End: 2021-07-20
  Filled 2021-01-04: qty 90, 30d supply, fill #0

## 2021-01-04 MED ORDER — CITALOPRAM HYDROBROMIDE 20 MG PO TABS
20.0000 mg | ORAL_TABLET | Freq: Every day | ORAL | 0 refills | Status: DC
  Filled 2021-01-04: qty 30, 30d supply, fill #0

## 2021-01-04 MED ORDER — TRAZODONE HCL 50 MG PO TABS
50.0000 mg | ORAL_TABLET | Freq: Every day | ORAL | 0 refills | Status: DC
  Filled 2021-01-04: qty 30, 30d supply, fill #0

## 2021-01-04 MED ORDER — ACETAMINOPHEN 325 MG PO TABS: 650.0000 mg | ORAL_TABLET | ORAL | Status: DC | PRN

## 2021-01-04 MED ORDER — ATORVASTATIN CALCIUM 80 MG PO TABS
80.0000 mg | ORAL_TABLET | Freq: Every day | ORAL | 0 refills | Status: DC
  Filled 2021-01-04: qty 30, 30d supply, fill #0

## 2021-01-04 MED ORDER — FLUDROCORTISONE ACETATE 0.1 MG PO TABS
0.3000 mg | ORAL_TABLET | Freq: Every day | ORAL | 1 refills | Status: DC
  Filled 2021-01-04: qty 90, 30d supply, fill #0

## 2021-01-04 MED ORDER — BETHANECHOL CHLORIDE 10 MG PO TABS
10.0000 mg | ORAL_TABLET | Freq: Four times a day (QID) | ORAL | 8 refills | Status: DC
  Filled 2021-01-04: qty 28, 7d supply, fill #0

## 2021-01-04 MED ORDER — AMANTADINE HCL 100 MG PO CAPS
100.0000 mg | ORAL_CAPSULE | Freq: Every day | ORAL | 0 refills | Status: DC
  Filled 2021-01-04: qty 30, 30d supply, fill #0

## 2021-01-04 NOTE — Progress Notes (Signed)
Occupational Therapy Session Note  Patient Details  Name: Felicia Chen MRN: [id number] Date of Birth: 01-Mar-1966  Today's Date: 01/04/2021 OT Individual Time: 0900-0950 OT Individual Time Calculation (min): 50 min    Short Term Goals: Week 2:  OT Short Term Goal 1 (Week 2): Pt will perform BSC/toilet transfer with Mod A of 1 OT Short Term Goal 1 - Progress (Week 2): Met OT Short Term Goal 2 (Week 2): Pt will perform RUE PROM HEP with no more than Min cues OT Short Term Goal 2 - Progress (Week 2): Progressing toward goal OT Short Term Goal 3 (Week 2): Pt will perform sit <> stands in prep for ADL with Min A OT Short Term Goal 3 - Progress (Week 2): Progressing toward goal OT Short Term Goal 4 (Week 2): Pt will perform LB dress w/ Mod A and AE PRN OT Short Term Goal 4 - Progress (Week 2): Progressing toward goal Week 3:  OT Short Term Goal 1 (Week 3): Pt will perform sit <> stands at [place] with Min A in prep for ADL OT Short Term Goal 1 - Progress (Week 3): Progressing toward goal OT Short Term Goal 2 (Week 3): Pt will perform squat pivot transfer to BSC/toilet with Min A OT Short Term Goal 2 - Progress (Week 3): Met OT Short Term Goal 3 (Week 3): Pt will perform LB dressing with Mod A OT Short Term Goal 3 - Progress (Week 3): Progressing toward goal OT Short Term Goal 4 (Week 3): Pt will improve awareness attending to RUE during ADL tasks in 3 consecutive sessions OT Short Term Goal 4 - Progress (Week 3): Progressing toward goal Week 4:  OT Short Term Goal 1 (Week 4): STGs = LTGs at [place] Therapeutic Interventions/Progress Updates:    Pt greeted at time of session semireclined in bed resting with husband and two daughters [name] and [name]) present for family ed. No pain reported. Initial part of session on LB dressing training with husband assisting with TEDS and donned underwear and pants today, with Mod A, assist to thread over feet with hemidressing techniques and  rolling L/R and partial bridging to get over hips. Husband assisting throughout. Supine > sit with husband assist. Focus of session on squat pivot transfers bed <> wheelchair and drop arm BSC multiple trials with husband and both daughters assisting. Cues for family/caregivers knee placement to block her R knee, as well as donning/doffing sling. Pt inconsistent at times 2/2 decreased communication and educated on importance of communication. All family members having multiple trials of practicing with squat pivot transfers. Note cues throughout for pausing, re-centering/pacing, decreasing anxiety. Hand off to SLP for family ed. Brief education as well regarding management of wheelchair parts for TIS feature and foot break.  Therapy Documentation Precautions:  Precautions Precautions: Fall Precaution Comments: R hemi, inattention. SBP parameters <160 per neuro on acute Restrictions Weight Bearing Restrictions: No    Therapy/Group: Individual Therapy  [name] 01/04/2021, 7:16 AM

## 2021-01-04 NOTE — Progress Notes (Signed)
Patient ID: Felicia Chen, female   DOB: 1965/12/12, 55 y.o.   MRN: [id number] Met with pt, husband and daughter's who are here for education-all report going well and feel prepared for discharge tomorrow. Equipment to be delivered today to room and home health arranged. Aware of team conference today and recommendations. All feel prepared for discharge tomorrow.

## 2021-01-04 NOTE — Plan of Care (Signed)
  Problem: RH Comprehension Communication Goal: LTG Patient will comprehend basic/complex auditory (SLP) Description: LTG: Patient will comprehend basic/complex auditory information with cues (SLP). Outcome: Completed/Met   Problem: RH Expression Communication Goal: LTG Patient will verbally express basic/complex needs(SLP) Description: LTG:  Patient will verbally express basic/complex needs, wants or ideas with cues  (SLP) Outcome: Completed/Met Goal: LTG Patient will increase word finding of common (SLP) Description: LTG:  Patient will increase word finding of common objects/daily info/abstract thoughts with cues using compensatory strategies (SLP). Outcome: Completed/Met   Problem: RH Memory Goal: LTG Patient will demonstrate ability for day to day (SLP) Description: LTG:   Patient will demonstrate ability for day to day recall/carryover during cognitive/linguistic activities with assist  (SLP) Outcome: Completed/Met Goal: LTG Patient will use memory compensatory aids to (SLP) Description: LTG:  Patient will use memory compensatory aids to recall biographical/new, daily complex information with cues (SLP) Outcome: Completed/Met   Problem: RH Awareness Goal: LTG: Patient will demonstrate awareness during functional activites type of (SLP) Description: LTG: Patient will demonstrate awareness during functional activites type of (SLP) Outcome: Completed/Met

## 2021-01-04 NOTE — Progress Notes (Signed)
[place] PHYSICAL MEDICINE & REHABILITATION PROGRESS NOTE  Subjective/Complaints:  Pt reports nothing new- more confident about d/c- LBM yesterday- working well- voiding well.  Does ROM of RUE/RLE-  4x/day- more R ankle movement, but "not great yet".    ROS:   Pt denies SOB, abd pain, CP, N/V/C/D, and vision changes   Objective: Vital Signs: Blood pressure 121/67, pulse 86, temperature 99.5 F (37.5 C), temperature source Oral, resp. rate 16, height 5' 7.99" (1.727 m), weight 103.3 kg, SpO2 97 %. No results found. No results for input(s): WBC, HGB, HCT, PLT in the last 72 hours.    No results for input(s): NA, K, CL, CO2, GLUCOSE, BUN, CREATININE, CALCIUM in the last 72 hours.     Intake/Output Summary (Last 24 hours) at 01/04/2021 1046 Last data filed at 01/04/2021 0900 Gross per 24 hour  Intake 480 ml  Output --  Net 480 ml        Physical Exam: BP 121/67 (BP Location: Left Arm)   Pulse 86   Temp 99.5 F (37.5 C) (Oral)   Resp 16   Ht 5' 7.99" (1.727 m)   Wt 103.3 kg   SpO2 97%   BMI 34.64 kg/m      General: awake, alert, appropriate, laying supine in bed- daughters in room; NAD HENT: conjugate gaze; oropharynx moist CV: regular rate; no JVD Pulmonary: CTA B/L; no W/R/R- good air movement GI: soft, NT, ND, (+)BS Psychiatric: appropriate Neurological: alert- barely notice aphasia unless pauses to think of a word every few sentences. RUE MAS of 1+ in this- did ROM already; R DF 2-/5 which is new.   Skin: Warm and dry.  Intact. Musc: No edema in extremities.  No tenderness in extremities. Neuro: Alert Motor: RUE: Shoulder abduction, elbow flexion/extension 0/5.  Hand grip 3/5 RLE: Hip flexion, knee extension 3 -/5, ankle dorsiflexion 0/5  Right lower extremity plantar flexion, knee extension 3/5, ankle dorsiflexion 0/5 Increased tone RUE Sensation diminished to light touch RUE/RLE, improving  Assessment/Plan: 1. Functional deficits which  require 3+ hours per day of interdisciplinary therapy in a comprehensive inpatient rehab setting. Physiatrist is providing close team supervision and 24 hour management of active medical problems listed below. Physiatrist and rehab team continue to assess barriers to discharge/monitor patient progress toward functional and medical goals   Care Tool:  Bathing    Body parts bathed by patient: Right arm, Chest, Abdomen, Right upper leg, Left upper leg, Face   Body parts bathed by helper: Left arm, Front perineal area, Buttocks, Right lower leg, Left lower leg     Bathing assist Assist Level: Maximal Assistance - Patient 24 - 49%     Upper Body Dressing/Undressing Upper body dressing   What is the patient wearing?: Pull over shirt    Upper body assist Assist Level: Maximal Assistance - Patient 25 - 49%    Lower Body Dressing/Undressing Lower body dressing      What is the patient wearing?: Pants, Underwear/pull up     Lower body assist Assist for lower body dressing: Maximal Assistance - Patient 25 - 49%     Toileting Toileting    Toileting assist Assist for toileting: 2 Helpers     Transfers Chair/bed transfer  Transfers assist     Chair/bed transfer assist level: Minimal Assistance - Patient > 75%     Locomotion Ambulation   Ambulation assist   Ambulation activity did not occur: Safety/medical concerns  Assist level: 2 helpers Assistive device: Other (  comment) (3 Musketeer) Max distance: 46ft   Walk 10 feet activity   Assist  Walk 10 feet activity did not occur: Safety/medical concerns  Assist level: Maximal Assistance - Patient 25 - 49% Assistive device: Lite Gait   Walk 50 feet activity   Assist Walk 50 feet with 2 turns activity did not occur: Safety/medical concerns  Assist level: Maximal Assistance - Patient 25 - 49% Assistive device: Lite Gait    Walk 150 feet activity   Assist Walk 150 feet activity did not occur: Safety/medical  concerns  Assist level: Maximal Assistance - Patient 25 - 49% Assistive device: Lite Gait    Walk 10 feet on uneven surface  activity   Assist Walk 10 feet on uneven surfaces activity did not occur: Safety/medical concerns         Wheelchair     Assist Is the patient using a wheelchair?: Yes Type of Wheelchair: Manual Wheelchair activity did not occur: Safety/medical concerns  Wheelchair assist level: Dependent - Patient 0%      Wheelchair 50 feet with 2 turns activity    Assist    Wheelchair 50 feet with 2 turns activity did not occur: Safety/medical concerns       Wheelchair 150 feet activity     Assist  Wheelchair 150 feet activity did not occur: Safety/medical concerns        Medical Problem List and Plan: 1.  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  Impaired mobility -DVT/anticoagulation:  Pharmaceutical: Continue Heparin  Lovenox             -antiplatelet therapy: N/A 3. Pain Management: As needed meds 4. Anxiety/depression: Continue Xanax 0.5 mg 3 times daily, BuSpar 5 mg twice daily increase to TID , hydroxyzine 25 mg 4 times daily- d/c atarax due to urinary retention  Celexa 20 mg daily for severe new onset depression.  10/17- doing better- brighter affect- con't regimen             -antipsychotic agents: N/A 5. Neuropsych: This patient is not fully capable of making decisions on her own behalf. 6. Skin/Wound Care: Routine skin checks 7. Fluids/Electrolytes/Nutrition: Routine in and outs 8.  History of hypertension, now with hypotension.               Monitor with increased mobility Vitals:   01/03/21 1829 01/04/21 0610  BP: 108/71 121/67  Pulse: 65 86  Resp: 16   Temp: 98.8 F (37.1 C) 99.5 F (37.5 C)  SpO2: 92% 97%    Con't Florinef 0.3 mg daily; Midodrine 10 mg  TID- off BP meds; con't ACE wraps, abd binder and TEDs  10/17- cannot increase meds yet- will need to conitnue long term  10/18- con't long term- and I will manage- d/w PA as well.  9.  Hyperlipidemia: Lipitor 10.  COVID-19.  Completed course of remdesivir.  Currently completing course of dexamethasone  10/17- cannot have covid vaccine until 11/1 11.  Obesity.  BMI 39.52.  Dietary follow-up.  Encourage weight loss 12.  Leukocytosis resolved 13.  Transaminitis: Resolved 14. Orthostatic hypotension: continue TEDs, added abdominal binder in case this is not enough.   Of blood pressure medications, improving on 10/15 15.  Urinary retention add urecholine increase to 10mg  QID , now off anticholinergic agents , BP too low for flomax also with  orthostasis   Improving  10/17- still voiding- con't regimen 16. Nausea  Schedule Zofran 4 mg at 6am daily to help with nausea and monitor   Controlled, will consider weaning 17. Spasticity  Will schedule for Dr Ranell Patrick to do Botox of RUE after d/c- 300 units RUE- since spasitcity progressively worse.   Proving with range of motion  10/18- doing ROM with good results, but will likely need Botox sooner than later of RUE.  18. Aphasia/poor motor planning  Amantadine 100 mg daily  10/18- will send home on Amantadine- so much improvement when this was added 19 Post nasal drip  10/7- added Flonase for sore throat/post nasal drip- if need be, might need Claritin.   LOS: 28 days A FACE TO FACE EVALUATION WAS PERFORMED  Felicia Chen 01/04/2021, 10:46 AM

## 2021-01-04 NOTE — Progress Notes (Signed)
Physical Therapy Discharge Summary  Patient Details  Name: Felicia Chen MRN: [id number] Date of Birth: 11-01-1965  Today's Date: 01/04/2021 PT Individual Time: 1412-1500 PT Individual Time Calculation (min): 48 min    Patient has met 5 of 6 long term goals due to improved activity tolerance, improved balance, improved postural control, increased strength, ability to compensate for deficits, functional use of  right lower extremity, improved attention, improved awareness, and improved coordination.  Patient to discharge at a wheelchair level [place].   Patient's care partner is independent to provide the necessary physical assistance at discharge. Pt is returning home with her husband and daughters who have been trained on transfers and mobility. All parties demoed appropriate and safe assistance levels. Pt requires min A for squat pivot transfers and car transfers. Pt has participated in high intensity gait training during rehab stay, walking up to 170 ft with lite gait and 112 ft with 3 musketeers style assist. Pt requires min A to stand and min-mod A to advance and place RLE during gait. Pt began training with hemi propulsion, but is unable to coordinate LLE and LUE at the same time. Min A w/c mobility with LLE only. Pt continues to demo R inattention at times.   Reasons goals not met: Pt did not meet w/c goal because she continues to have difficulty coordinating LLE and LUE motion at the same time for effective hemi propulsion.   Recommendation:  Patient will benefit from ongoing skilled PT services in home health setting to continue to advance safe functional mobility, address ongoing impairments in strength, balance, coordination, endurance, and minimize fall risk. This therapist recommends a quick transition to OPPT as soon as it is safe to do so to continue high intensity gait training for maximum functional return.    Equipment: Specialty w/c through [employer]  Reasons for  discharge: treatment goals met and discharge from hospital  Patient/family agrees with progress made and goals achieved: Yes  Skilled Therapeutic Interventions/Progress Updates:  Session 1: Pt seated in w/c on arrival and agreeable to therapy. No complaint of pain. Pt's husband, [name], and daughters, [name] and [name], present for continued family education. Significant time spent orienting family members to [place] chair, including removing back rest, brakes, and tilt functions. Pt's family trained on bumping up small step with TIS to get into home. Pt requested to repeat training with car transfer. Pt and family performed transfer with no issues, demoing appropriate and safe assistance throughout. Returned to pt's room, [name] assisted with squat pivot transfer back to bed. Bed mobility with supervision. Pt remained in bed with family present and was left with all needs in reach and alarm active.   Session 2: pt received in bed and agreeable to therapy. No complaint of pain. Supine>sit with supervision and bed rail from flat bed. Squat pivot to w/c with min A. Pt transported to therapy gym for time management and energy conservation. Session focused on high intensity gait training to continue working toward functional return of mobility. Gait with 3 musketeers style assist x 83 ft, x 92 ft, 2 x 111 ft (greatest distance and attempts to date). Pt demoes greatly improved ability to advance RLE, assist needed for placement. Pt was able to bear weight on RLE without buckling throughout session. Pt did require verbal cues for sequencing and technique, but was able to implement with improved coordination compared to previous sessions. Pt returned to room after session and remained in w/c. Pt was left seated in  chair with her daughters present.   PT Discharge Precautions/Restrictions Precautions Precautions: Fall Precaution Comments: R hemi, inattention, R visual field loss Restrictions Weight  Bearing Restrictions: No  Pain Pain Assessment Pain Scale: 0-10 Pain Score: 0-No pain Pain Interference Pain Interference Pain Effect on Sleep: 0. Does not apply - I have not had any pain or hurting in the past 5 days Pain Interference with Therapy Activities: 1. Rarely or not at all Pain Interference with Day-to-Day Activities: 1. Rarely or not at all Vision/Perception  Vision - History Ability to See in Adequate Light: 0 Adequate Perception Perception: Impaired Inattention/Neglect: Does not attend to right visual field;Does not attend to right side of body Praxis Praxis: Impaired Praxis Impairment Details: Motor planning  Cognition Overall Cognitive Status: Impaired/Different from baseline Arousal/Alertness: Awake/alert Orientation Level: Oriented X4 Year: 2022 Month: September Day of Week: Correct Attention: Sustained Sustained Attention: Appears intact Memory: Impaired Memory Impairment: Decreased short term memory Decreased Short Term Memory: Verbal complex;Functional complex Immediate Memory Recall: Sock;Blue;Bed Memory Recall Sock: Without Cue Memory Recall Blue: With Cue Memory Recall Bed: With Cue Awareness: Impaired Awareness Impairment: Anticipatory impairment Problem Solving: Impaired Problem Solving Impairment: Verbal basic;Functional basic Executive Function: Writer: Impaired Organizing Impairment: Verbal basic;Functional basic Self Monitoring: Impaired Self Correcting: Impaired Behaviors: Impulsive Safety/Judgment: Impaired Sensation Sensation Light Touch: Impaired by gross assessment Proprioception: Impaired by gross assessment Stereognosis: Not tested Additional Comments: Impaired proprioception in RLE Coordination Gross Motor Movements are Fluid and Coordinated: No Fine Motor Movements are Fluid and Coordinated: No Coordination and Movement Description: limited by R hemi, RLE demoes voluntary movement with alternating  flexor/extensor tone and poor coordination Finger Nose Finger Test: unable with RUE Motor  Motor Motor: Hemiplegia Motor - Skilled Clinical Observations: RLE demoes voluntart movement during gait, alternating flexor/extensor tone  Mobility Bed Mobility Bed Mobility: Rolling Right;Right Sidelying to Sit;Supine to Sit Rolling Right: Independent Right Sidelying to Sit: Supervision/Verbal cueing Supine to Sit: Supervision/Verbal cueing Transfers Transfers: Sit to Stand;Stand to Sit;Squat Pivot Transfers Sit to Stand: Minimal Assistance - Patient > 75% Stand to Sit: Minimal Assistance - Patient > 75% Squat Pivot Transfers: Contact Guard/Touching assist;Minimal Assistance - Patient > 75% Transfer (Assistive device): None Locomotion  Gait Ambulation: Yes Gait Assistance: 2 Helpers Psychologist, educational) Gait Distance (Feet): 170 Feet Assistive device: Body weight support system Gait Assistance Details: Tactile cues for initiation;Tactile cues for weight shifting;Manual facilitation for weight shifting;Manual facilitation for weight bearing;Tactile cues for sequencing;Verbal cues for technique;Verbal cues for gait pattern;Verbal cues for sequencing;Verbal cues for precautions/safety;Manual facilitation for placement Gait Assistance Details: assist to place RLE in neutral position and improve stability, multimodal cues for sequencing gait, pt did extremely well with 3 musketeers and lite gait Gait Gait: Yes Gait Pattern: Impaired Gait Pattern: Decreased step length - right;Decreased step length - left;Decreased stance time - right;Step-through pattern;Trunk flexed;Lateral hip instability;Poor foot clearance - right;Right flexed knee in stance;Decreased weight shift to right;Decreased weight shift to left;Decreased dorsiflexion - right Gait velocity: decr Stairs / Additional Locomotion Stairs: No Pick up small object from the floor (from standing position) activity did not occur: Safety/medical  concerns Product manager Mobility: Yes Wheelchair Assistance: Minimal assistance - Patient >75% Wheelchair Propulsion: Left lower extremity Wheelchair Parts Management: Needs assistance Distance: 150 ft  Trunk/Postural Assessment  Cervical Assessment Cervical Assessment: Exceptions to [facility] Thoracic Assessment Thoracic Assessment: Exceptions to [facility] Lumbar Assessment Lumbar Assessment: Exceptions to [facility] Postural Control Postural Control: Deficits on evaluation Trunk Control: occ delayed, but functional for d/c  Balance Balance Balance Assessed: Yes Static Sitting Balance Static Sitting - Balance Support: Feet supported;Left upper extremity supported Static Sitting - Level of Assistance: 5: Stand by assistance Dynamic Sitting Balance Dynamic Sitting - Balance Support: Feet supported Dynamic Sitting - Level of Assistance: 5: Stand by assistance Dynamic Sitting - Balance Activities: Lateral lean/weight shifting;Forward lean/weight shifting Static Standing Balance Static Standing - Balance Support: During functional activity;Bilateral upper extremity supported Static Standing - Level of Assistance: 4: Min assist Extremity Assessment  RUE Assessment RUE Assessment: Exceptions to [facility] Passive Range of Motion (PROM) Comments: PROM WNL General Strength Comments: 1/5 shoulder, 2-/5 in bicep, limited tricep, does have grip in R hand/digits RUE Body System: Neuro Brunstrum levels for arm and hand: Arm;Hand Brunstrum level for arm: Stage I Presynergy;Stage II Synergy is developing Brunstrum level for hand: Stage II Synergy is developing LUE Assessment LUE Assessment: Within Functional Limits RLE Assessment RLE Assessment: Exceptions to [facility] General Strength Comments: Hip/knee 3+/5, ankle motion 2+/5, huge functional improvement from baseline LLE Assessment General Strength Comments: Grossly 4/5, functional improvement d/t improved coordination    Mickel Fuchs 01/04/2021, 5:01 PM

## 2021-01-04 NOTE — Patient Care Conference (Signed)
Inpatient RehabilitationTeam Conference and Plan of Care Update Date: 01/04/2021   Time: 11:47 AM    Patient Name: Felicia Chen      Medical Record Number: [id number]  Date of Birth: March 22, 1965 Sex: Female         Room/Bed: 3I95J/8A41Y-60 Payor Info: Payor: Theme park manager / Plan: [place] / Product Type: *No Product type* /    Admit Date/Time:  12/07/2020  5:42 PM  Primary Diagnosis:  ICH (intracerebral hemorrhage) [facility])  Hospital Problems: Principal Problem:   ICH (intracerebral hemorrhage) ([place]) Active Problems:   Transaminitis   Right hemiplegia (HCC)   Nausea without vomiting   Spastic hemiparesis ([place])   History of hypertension   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 Courtney Heys Social Worker Present: Ovidio Kin, LCSW Nurse Present: Dorthula Nettles, RN PT Present: Ailene Rud, PT OT Present: Lillia Corporal, OT SLP Present: Lillie Columbia, SLP PPS Coordinator present : Gunnar Fusi, SLP     Current Status/Progress Goal Weekly Team Focus  Bowel/Bladder   continent b/b, lbm 10/16  Regain continence  timed toilet and toilet prn   Swallow/Nutrition/ Hydration             ADL's   CGA squat pivot, Min A sit <> stands with support, Mod/Max LB dress, voiding improved on toilet/BSC, BP improved, RUE improved movement  CGA transfers, Mod LB  NA grad day   Mobility   supine<>sit supervision, squat pivot min-mod both directions, STS min-mod for RLE, gait up to 170 ft with lite gait  min A transfers, mod A gait  GRAD DAY   Communication      sup A for expression, min A for comprehension  goals met, d/c   Safety/Cognition/ Behavioral Observations     supervision  goals met, d/c   Pain   5/10 pain  <2  assess q 4hr and prn   Skin   cdi  Maintain skin integrity on remainder of skin  assess skin q shift and prn      Discharge Planning:  Family education taking place this week in prepartion for discharge tomorrow. Will start off with [place] then transition to OP   Team Discussion: Discharge tomorrow. RUE to have Botox with Dr. Ranell Patrick. Comfortable with discharge tomorrow. Continent B/B, LBM 10/16. Reports 5/10 pain. Family education completed. HH then transition to OP. Equipment to be delivered today. Patient on target to meet rehab goals: yes, contact guard squat pivot transfers. Ready for discharge. Tilt-n-space W/C getting in/out of house. Min assist/contact guard transfers. Supervision comprehension/expression. Little anxious but ready to go home. Family education completed.  *See Care Plan and progress notes for long and short-term goals.   Revisions to Treatment Plan:  Finalizing discharge medications and discharge plans.  Teaching Needs: Family education completed.  Current Barriers to Discharge: Decreased caregiver support, Home enviroment access/layout, Lack of/limited family support, Weight, and Medication compliance  Possible Resolutions to Barriers: Family education completed, DME delivered today.     Medical Summary Current Status: home on amantadine ; home on all meds to raise BP; continent B/B; no skin issues  Barriers to Discharge: Home enviroment access/layout;Weight  Barriers to Discharge Comments: family has done family training- going to H/H; DME coming today- d/c tomorrow Possible Resolutions to [employer] Focus: ready for d/c tomorrow; Botox scheduled for RUE; doing better ROM herself for RUE/RLE- rx given for Rowe Robert- family great training- d/c  tomorrow- using numotion for w/c. needs to get out of tilt in space w/c as soon as possible.- supervision for aphasia- higher level cognition. suggested aphasia groups.   Continued Need for Acute Rehabilitation Level of Care: The patient requires daily medical management by a physician with specialized training in physical medicine  and rehabilitation for the following reasons: Direction of a multidisciplinary physical rehabilitation program to maximize functional independence : Yes Medical management of patient stability for increased activity during participation in an intensive rehabilitation regime.: Yes Analysis of laboratory values and/or radiology reports with any subsequent need for medication adjustment and/or medical intervention. : Yes   I attest that I was present, lead the team conference, and concur with the assessment and plan of the team.   JENNINGS, STACEY G 01/04/2021, 4:37 PM        

## 2021-01-04 NOTE — Progress Notes (Signed)
Inpatient Rehabilitation Care Coordinator Discharge Note   Patient Details  Name: Felicia Chen MRN: [id number] Date of Birth: Dec 25, 1965   Discharge location: [facility] [facility]  Length of Stay:  51 DAYS  Discharge activity level: MIN-CGA LEVEL  Home/community participation: ACTIVE  Patient response XB:WIOMBT Literacy - How often do you need to have someone help you when you read instructions, pamphlets, or other written material from your doctor or pharmacy?: Never  Patient response DH:RCBULA Isolation - How often do you feel lonely or isolated from those around you?: Never  Services provided included: MD, RD, PT, OT, SLP, RN, CM, TR, Pharmacy, Neuropsych, SW  Financial Services:  Charity fundraiser Utilized: Regulatory affairs officer  Choices offered to/list presented to: PT AND FAMILY  Follow-up services arranged:  Home Health, DME, Patient/Family has no preference for HH/DME agencies Home Health Agency: [facility]-PT,OT,SP    DME : [facility]-ARM BEDSIDE COMMODE AND NU MOTION-WHEELCHAIR    Patient response to transportation need: Is the patient able to respond to transportation needs?: Yes In the past 12 months, has lack of transportation kept you from medical appointments or from getting medications?: No In the past 12 months, has lack of transportation kept you from meetings, work, or from getting things needed for daily living?: No    Comments (or additional information):HUSBAND AND TWO DAUGHTER'S WERE HERE FOR MULTIPLE DAYS TO DO FAMILY TRAINING AND ALL FELT COMFORTABLE WITH HER CARE AND READY TO [place]. AWARE BEGIN HOME HEALTH AND THEN TRANSITION TO OP ONCE MORE ABLE TO DO CAR TRANSFERS.  Patient/Family verbalized understanding of follow-up arrangements:  Yes  Individual responsible for coordination of the follow-up plan: [facility] [phone]  Confirmed correct DME delivered: Elease Hashimoto 01/04/2021    Elease Hashimoto

## 2021-01-04 NOTE — Progress Notes (Signed)
Speech Language Pathology Discharge Summary  Patient Details  Name: Felicia Chen MRN: [id number] Date of Birth: 07-28-1965  Today's Date: 01/04/2021 SLP Individual Time: 2334-8601 SLP Individual Time Calculation (min): 43 min   Skilled Therapeutic Interventions: Pt seen for skilled ST with focus on cognitive goals and family education, husband present throughout treatment. Reviewed memory compensatory strategies, energy conservation strategies, fall prevention, safety awareness and compensatory strategies for attention. Pt still concerned with mathematics and numbers, discussed utilizing iPad with stylus to complete simple math games and activities starting simple. SLP facilitating alternating attention task by providing extra time and supervision A cues. If pt were challenged with a response, she would pause and state "ok, let me use a strategy" to increase accuracy and reduce frustration. Pt demonstrates good carryover of compensatory strategies as trained and has very supportive family to assist. Pt left in wheelchair with all questions answered and husband present.   Patient has met 6 of 6 long term goals.  Patient to discharge at overall Supervision level.   Clinical Impression/Discharge Summary:    Pt has made excellent gains meeting 6 out of 6 long term goals for Speech Therapy. Pt is communicating and comprehending complex conversation level with Supervision A for use of strategies for occ high level word finding issues. Pt able to complete mildly complex cognitive tasks with supervision A. Education completed with patient and family for discharge home, including recommended home exercises to promote cognitive function and community support groups for Stroke and Aphasia which were appreciated. Pt will need assistance and supervision with all higher level cognitive tasks (med management, money management, meal planning, childcare, etc). Recommend home health ST services followed by  Outpatient ST services and 24/7 supervision at discharge.  Care Partner:  Caregiver Able to Provide Assistance: Yes  Type of Caregiver Assistance: Physical;Cognitive  Recommendation:  Home Health SLP;Outpatient SLP;24 hour supervision/assistance  Rationale for SLP Follow Up: Maximize functional communication;Maximize cognitive function and independence;Reduce caregiver burden   Equipment: none  Reasons for discharge: Discharged from hospital   Patient/Family Agrees with Progress Made and Goals Achieved: Yes    Dewaine Conger 01/04/2021, 10:29 AM

## 2021-01-04 NOTE — Progress Notes (Signed)
Occupational Therapy Discharge Summary  Patient Details  Name: Felicia Chen MRN: [id number] Date of Birth: 07-15-1965    Patient has met 7 of 12 long term goals due to improved activity tolerance, improved balance, postural control, ability to compensate for deficits, functional use of  RIGHT upper extremity, improved attention, improved awareness, and improved coordination.  Patient to discharge at [facility] Assist - Mod A level.  Patient's care partner is independent to provide the necessary physical and cognitive assistance at discharge.  The pt has shown significant progress toward OT goals and currently requires Mod A for sponge bathing at EOB, Mod A for donning pants/underwear bed level with rolling, Mod A for donning shirt, CGA-occasional Min A for squat pivot transfers to L and R side, and Mod A for assist with toileting. Note pt's family has been present for most sessions and husband [name] with daughters [name] and [name] have also completed family education and training on 10/17-10/18 and feel prepared to take the pt home. Extensive discussion with patient and family regarding DC planning and problem solving home set up. Family also provided with [facility] prescription (signed by MD) with instructions for how to acquire as well.   Reasons goals not met: Pt's RUE has improved AROM but still unable to use in a functional manner. Pt also needs Mod A for bathing tasks, and needs Min A for standing balance for R knee blocking in standing during ADL tasks.  Recommendation:  Patient will benefit from ongoing skilled OT services in home health setting to continue to advance functional skills in the area of BADL, iADL, and Reduce care partner burden.  Equipment: DABSC  Reasons for discharge: treatment goals met and discharge from hospital and insurance  Patient/family agrees with progress made and goals achieved: Yes  OT Discharge Precautions/Restrictions  Precautions Precautions:  Fall Precaution Comments: R hemi, inattention, R visual field loss Restrictions Weight Bearing Restrictions: No Pain Pain Assessment Pain Scale: 0-10 Pain Score: 0-No pain ADL ADL Eating: Set up Grooming: Supervision/safety Upper Body Bathing: Minimal assistance Lower Body Bathing: Moderate assistance, Minimal assistance Upper Body Dressing: Moderate assistance Lower Body Dressing: Moderate assistance Toileting: Moderate assistance Toilet Transfer: Therapist, music Method: Squat pivot Toilet Transfer Equipment: Drop arm bedside commode Vision Baseline Vision/History: 1 Wears glasses Patient Visual Report: Other (comment) (R field loss) Perception  Perception: Impaired Inattention/Neglect: Does not attend to right visual field;Does not attend to right side of body Praxis Praxis: Impaired Praxis Impairment Details: Motor planning Cognition Overall Cognitive Status: Impaired/Different from baseline Arousal/Alertness: Awake/alert Orientation Level: Oriented X4 Year: 2022 Month: September Day of Week: Correct Attention: Sustained Sustained Attention: Appears intact Memory: Impaired Memory Impairment: Decreased short term memory Decreased Short Term Memory: Verbal complex;Functional complex Immediate Memory Recall: Sock;Blue;Bed Memory Recall Sock: Without Cue Memory Recall Blue: With Cue Memory Recall Bed: With Cue Awareness: Impaired Awareness Impairment: Anticipatory impairment Problem Solving: Impaired Problem Solving Impairment: Verbal basic;Functional basic Executive Function: Writer: Impaired Organizing Impairment: Verbal basic;Functional basic Self Monitoring: Impaired Self Correcting: Impaired Behaviors: Impulsive Safety/Judgment: Impaired Sensation Sensation Light Touch: Impaired by gross assessment Proprioception: Impaired by gross assessment Stereognosis: Not tested Additional Comments: Impaired proprioception in  RLE Coordination Gross Motor Movements are Fluid and Coordinated: No Fine Motor Movements are Fluid and Coordinated: No Coordination and Movement Description: limited by R hemi, RLE demoes voluntary movement with alternating flexor/extensor tone and poor coordination Finger Nose Finger Test: unable with RUE Motor  Motor Motor: Hemiplegia Motor - Skilled Clinical  Observations: RLE demoes voluntart movement during gait, alternating flexor/extensor tone Mobility  Bed Mobility Bed Mobility: Rolling Right;Right Sidelying to Sit;Supine to Sit Rolling Right: Independent Right Sidelying to Sit: Supervision/Verbal cueing Supine to Sit: Supervision/Verbal cueing Transfers Sit to Stand: Minimal Assistance - Patient > 75% Stand to Sit: Minimal Assistance - Patient > 75%  Trunk/Postural Assessment  Cervical Assessment Cervical Assessment: Exceptions to [facility] Thoracic Assessment Thoracic Assessment: Exceptions to [facility] Lumbar Assessment Lumbar Assessment: Exceptions to [facility] Postural Control Postural Control: Deficits on evaluation Trunk Control: occ delayed, but functional for d/c  Balance Balance Balance Assessed: Yes Static Sitting Balance Static Sitting - Balance Support: Feet supported;Left upper extremity supported Static Sitting - Level of Assistance: 5: Stand by assistance Dynamic Sitting Balance Dynamic Sitting - Balance Support: Feet supported Dynamic Sitting - Level of Assistance: 5: Stand by assistance Dynamic Sitting - Balance Activities: Lateral lean/weight shifting;Forward lean/weight shifting Static Standing Balance Static Standing - Balance Support: During functional activity;Bilateral upper extremity supported Static Standing - Level of Assistance: 4: Min assist Extremity/Trunk Assessment RUE Assessment RUE Assessment: Exceptions to [facility] Passive Range of Motion (PROM) Comments: PROM WNL General Strength Comments: 1/5 shoulder, 2-/5 in bicep, limited tricep, does have grip  in R hand/digits RUE Body System: Neuro Brunstrum levels for arm and hand: Arm;Hand Brunstrum level for arm: Stage I Presynergy;Stage II Synergy is developing Brunstrum level for hand: Stage II Synergy is developing LUE Assessment LUE Assessment: Within Functional Limits   [name] 01/04/2021, 4:59 PM

## 2021-01-05 NOTE — Progress Notes (Signed)
Inpatient Rehabilitation Discharge Medication Review by a Pharmacist  A complete drug regimen review was completed for this patient to identify any potential clinically significant medication issues.  High Risk Drug Classes Is patient taking? Indication by Medication  Antipsychotic Yes Buspar-depression/anxiety  Anticoagulant No   Antibiotic No   Opioid No   Antiplatelet No   Hypoglycemics/insulin No   Vasoactive Medication Yes Midodrine-blood pressure  Chemotherapy No   Other No      Type of Medication Issue Identified Description of Issue Recommendation(s)  Drug Interaction(s) (clinically significant)     Duplicate Therapy     Allergy     No Medication Administration End Date     Incorrect Dose     Additional Drug Therapy Needed     Significant med changes from prior encounter (inform family/care partners about these prior to discharge). These meds have been discontinued from PTA: sertraline, hydroxyzine  These meds discontinued from this encounter: Losartan, amlodipine    Other       Clinically significant medication issues were identified that warrant physician communication and completion of prescribed/recommended actions by midnight of the next day:  No   Time spent performing this drug regimen review (minutes):  20   Thank you for allowing [place] to participate in this patients care. Jens Som, PharmD 01/05/2021 8:15 AM  **Pharmacist phone directory can be found on [email or web address] listed under [place]**

## 2021-01-05 NOTE — Progress Notes (Signed)
Pt is ready for discharge.  Waiting on equipment and TC for medications. Nurse aware. Family at bedside. All needs met.

## 2021-01-05 NOTE — Progress Notes (Signed)
[place] PHYSICAL MEDICINE & REHABILITATION PROGRESS NOTE  Subjective/Complaints:  Pt ready for d/c- feels much more comfortable-  Walked length of long hallway yesterday x4- with 2 person assist.    ROS:   Pt denies SOB, abd pain, CP, N/V/C/D, and vision changes   Objective: Vital Signs: Blood pressure 122/62, pulse 81, temperature 98.5 F (36.9 C), temperature source Oral, resp. rate 18, height 5' 7.99" (1.727 m), weight 103.3 kg, SpO2 92 %. No results found. No results for input(s): WBC, HGB, HCT, PLT in the last 72 hours.    No results for input(s): NA, K, CL, CO2, GLUCOSE, BUN, CREATININE, CALCIUM in the last 72 hours.     Intake/Output Summary (Last 24 hours) at 01/05/2021 0851 Last data filed at 01/04/2021 1300 Gross per 24 hour  Intake 460 ml  Output --  Net 460 ml        Physical Exam: BP 122/62 (BP Location: Left Arm)   Pulse 81   Temp 98.5 F (36.9 C) (Oral)   Resp 18   Ht 5' 7.99" (1.727 m)   Wt 103.3 kg   SpO2 92%   BMI 34.64 kg/m       General: awake, alert, appropriate,sitting up in bed; daughter at bedside; NAD HENT: conjugate gaze; oropharynx moist CV: regular rate; no JVD Pulmonary: CTA B/L; no W/R/R- good air movement GI: soft, NT, ND, (+)BS Psychiatric: appropriate Neurological: alert- barely notice aphasia unless pauses to think of a word every few sentences. Doing much better; RUE MAS of 1+ in this- did ROM already; R DF 2-/5 which is new.   Skin: Warm and dry.  Intact. Musc: No edema in extremities.  No tenderness in extremities. Neuro: Alert Motor: RUE: Shoulder abduction, elbow flexion/extension 0/5.  Hand grip 3/5 RLE: Hip flexion, knee extension 3 -/5, ankle dorsiflexion 0/5  Right lower extremity plantar flexion, knee extension 3/5, ankle dorsiflexion 0/5 Increased tone RUE Sensation diminished to light touch RUE/RLE, improving  Assessment/Plan: 1. Functional deficits which require 3+ hours per day of  interdisciplinary therapy in a comprehensive inpatient rehab setting. Physiatrist is providing close team supervision and 24 hour management of active medical problems listed below. Physiatrist and rehab team continue to assess barriers to discharge/monitor patient progress toward functional and medical goals   Care Tool:  Bathing    Body parts bathed by patient: Right arm, Chest, Abdomen, Right upper leg, Left upper leg, Face, Front perineal area, Buttocks   Body parts bathed by helper: Left arm, Right lower leg, Left lower leg     Bathing assist Assist Level: Moderate Assistance - Patient 50 - 74%     Upper Body Dressing/Undressing Upper body dressing   What is the patient wearing?: Pull over shirt    Upper body assist Assist Level: Moderate Assistance - Patient 50 - 74%    Lower Body Dressing/Undressing Lower body dressing      What is the patient wearing?: Pants, Underwear/pull up     Lower body assist Assist for lower body dressing: Moderate Assistance - Patient 50 - 74%     Toileting Toileting    Toileting assist Assist for toileting: Moderate Assistance - Patient 50 - 74% (with extensive time and multimodal cues)     Transfers Chair/bed transfer  Transfers assist     Chair/bed transfer assist level: Contact Guard/Touching assist     Locomotion Ambulation   Ambulation assist   Ambulation activity did not occur: Safety/medical concerns  Assist level: 2 helpers Assistive  device: Lite Gait Max distance: 170 ft   Walk 10 feet activity   Assist  Walk 10 feet activity did not occur: Safety/medical concerns  Assist level: 2 helpers Assistive device: Lite Gait   Walk 50 feet activity   Assist Walk 50 feet with 2 turns activity did not occur: Safety/medical concerns  Assist level: 2 helpers Assistive device: Lite Gait    Walk 150 feet activity   Assist Walk 150 feet activity did not occur: Safety/medical concerns  Assist level: 2  helpers Assistive device: Lite Gait    Walk 10 feet on uneven surface  activity   Assist Walk 10 feet on uneven surfaces activity did not occur: Safety/medical concerns         Wheelchair     Assist Is the patient using a wheelchair?: Yes Type of Wheelchair: Manual Wheelchair activity did not occur: Safety/medical concerns  Wheelchair assist level: Minimal Assistance - Patient > 75% (LLE hemi propulsion) Max wheelchair distance: 150 ft    Wheelchair 50 feet with 2 turns activity    Assist    Wheelchair 50 feet with 2 turns activity did not occur: Safety/medical concerns   Assist Level: Minimal Assistance - Patient > 75%   Wheelchair 150 feet activity     Assist  Wheelchair 150 feet activity did not occur: Safety/medical concerns   Assist Level: Minimal Assistance - Patient > 75%    Medical Problem List and Plan: 1.  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  2.  Impaired mobility -DVT/anticoagulation:  Pharmaceutical: Continue Heparin  Lovenox             -antiplatelet therapy: N/A 3. Pain Management: As needed meds 4. Anxiety/depression: Continue Xanax 0.5 mg 3 times daily, BuSpar 5 mg twice daily increase to TID , hydroxyzine 25 mg 4 times daily- d/c atarax due to urinary retention  Celexa 20 mg daily for severe new onset depression.  10/17- doing better- brighter affect- con't regimen             -antipsychotic agents: N/A 5. Neuropsych: This patient is not fully capable of making decisions on her own behalf. 6. Skin/Wound Care: Routine skin checks 7. Fluids/Electrolytes/Nutrition: Routine in and outs 8.  History of hypertension, now with hypotension.               Monitor with increased mobility Vitals:   01/04/21 2051 01/05/21 0534  BP: (!) 95/52  122/62  Pulse:  81  Resp:  18  Temp:  98.5 F (36.9 C)  SpO2:  92%    Con't Florinef 0.3 mg daily; Midodrine 10 mg TID- off BP meds; con't ACE wraps, abd binder and TEDs  10/17- cannot increase meds yet- will need to conitnue long term  10/18- con't long term- and I will manage- d/w PA as well.   10/19- BP 95/52- will con't current regimen since 1 time vital 9.  Hyperlipidemia: Lipitor 10.  COVID-19.  Completed course of remdesivir.  Currently completing course of dexamethasone  10/17- cannot have covid vaccine until 11/1 11.  Obesity.  BMI 39.52.  Dietary follow-up.  Encourage weight loss 12.  Leukocytosis resolved 13.  Transaminitis: Resolved 14. Orthostatic hypotension: continue TEDs, added abdominal binder in case this is not enough.   Of blood pressure medications, improving on 10/15 15.  Urinary retention add urecholine increase to 10mg  QID , now off anticholinergic agents , BP too low for flomax also with orthostasis   Improving  10/17- still voiding- con't regimen 16. Nausea  Schedule Zofran 4 mg at 6am daily to help with nausea and monitor   Controlled, will consider weaning 17. Spasticity  Will schedule for Dr Ranell Patrick to do Botox of RUE after d/c- 300 units RUE- since spasitcity progressively worse.   Proving with range of motion  10/18- doing ROM with good results, but will likely need Botox sooner than later of RUE.   10/19- has appt with Dr Ranell Patrick for Botox 18. Aphasia/poor motor planning  Amantadine 100 mg daily  10/18- will send home on Amantadine- so much improvement when this was added  101/9- I'll prescribe in future 19 Post nasal drip  10/7- added Flonase for sore throat/post nasal drip- if need be, might need Claritin. 20. Dispo  10/19- d/c today    LOS: 29 days A FACE TO FACE EVALUATION WAS PERFORMED  Magin Balbi 01/05/2021, 8:51 AM

## 2021-01-05 NOTE — Progress Notes (Signed)
Recreational Therapy Discharge Summary Patient Details  Name: KYNA BLAHNIK MRN: [id number] Date of Birth: Aug 11, 1965 Today's Date: 01/05/2021  Long term goals set: 1  Long term goals met: 1  Comments on progress toward goals: Pt has made great progress during LOS and is excited about discharge  home today with family to provide/coordinate 24 hour care.  TR sessions focused on pt education including leisure education, activity analysis identifying potential modifications, energy conservation , pet therapy & stress management/coping strategies.  Pt requires set up assist for simple tasks seated w/c level.     Reasons goals not met: n/a  Equipment acquired: hand out provided on stress management/coping strategies  Reasons for discharge: discharge from hospital  Patient/family agrees with progress made and goals achieved: Yes  Latiqua Daloia 01/05/2021, 10:02 AM

## 2021-01-07 ENCOUNTER — Telehealth: Payer: Self-pay

## 2021-01-07 NOTE — Telephone Encounter (Signed)
Discharge summary incomplete

## 2021-01-11 ENCOUNTER — Telehealth: Payer: Self-pay

## 2021-01-11 NOTE — Telephone Encounter (Signed)
Shirlee Latch called to set up speech for patient 2x/week for 3 weeks. Verbal order given for speech therapy

## 2021-01-17 ENCOUNTER — Ambulatory Visit: Payer: Self-pay | Admitting: *Deleted

## 2021-01-17 NOTE — Telephone Encounter (Signed)
Pt reports on Midodrine s/p CVA 12/01/20, discharged 01/05/21 from Inpatient Rehab.  Reports BP has been elevated, prior to call 143/102. Yesterday 145/90  167/110  and 150/90.  Reports dizziness "But better than yesterday." Also reports nausea, no vomiting. Denies headache, no visual changes, no CP or tightness. Has continued Midodrine as directed post D/C. Noted med is on D/C med list. Asked pt to check BP during call, 137/92  Pt has PCP. Advised to hold Midodrine tonight (On TID has taken 2 does today) and alert PCP in AM. Advised ED for ANY worsening symptoms, vomiting occurs,visual changes, headache, dizziness worsens.Advised continue to monitor BP.   Pt verbalizes understanding.    Reason for Disposition  Systolic BP  >= 916 OR Diastolic >= 945    On med to raise BP  Answer Assessment - Initial Assessment Questions 1. BLOOD PRESSURE: "What is the blood pressure?" "Did you take at least two measurements 5 minutes apart?"     143/102 this evening, during call 137/92 2. ONSET: "When did you take your blood pressure?"      20 minutes ago 3. HOW: "How did you obtain the blood pressure?" (e.g., visiting nurse, automatic home BP monitor)     Home,both monitorand manual 4. HISTORY: "Do you have a history of high blood pressure?"     No 5. MEDICATIONS: "Are you taking any medications for blood pressure?" "Have you missed any doses recently?"     To raise pressure 6. OTHER SYMPTOMS: "Do you have any symptoms?" (e.g., headache, chest pain, blurred vision, difficulty breathing, weakness)     Dizziness, not as bad today, nausea this afternoon.  Protocols used: Blood Pressure - High-A-AH

## 2021-01-17 NOTE — Telephone Encounter (Signed)
This encounter was created in error - please disregard.

## 2021-01-18 ENCOUNTER — Telehealth: Payer: Self-pay

## 2021-01-18 NOTE — Telephone Encounter (Signed)
Pt's BP in 150s-160s - have held Midodrine since yesterday evening- last dose 1pm yesterday.  Still taking/taken Florinef 0.3 mg daily this AM-  Explained it's hitting its maximal effect, and that's why BP is so high.   Asked her to reduce Florinef to 0.1 mg daily as of tomorrow AM and monitor BP-  Will wait to have her call PCP- since it's the low BP meds that are causing the issue.   Call me T/Friday this week to let me know if BP reducing-  If not enough, will stop Florinef at that time.   If BP gets up to 198 or higher systolic, to cal me AND PCP and we can control BP faster, but explained it's OK now- just goal to bring it down over the next week.   Educated PT from [place] as well as pt and husband.   No questions at end of conversation.

## 2021-01-18 NOTE — Telephone Encounter (Signed)
[name] (Spouse) called: Felicia Chen  (Stroke patient) blood pressure has been for elevated 3 days, 90/145, 90/150 & 160/82. This the morning the bp is 160/82.   Rx Midodrine on hold for now .  Please call back to advise.   Call back phone [phone].

## 2021-01-24 ENCOUNTER — Other Ambulatory Visit: Payer: Self-pay

## 2021-01-24 ENCOUNTER — Encounter: Payer: 59 | Attending: Physical Medicine and Rehabilitation | Admitting: Physical Medicine and Rehabilitation

## 2021-01-24 ENCOUNTER — Encounter: Payer: Self-pay | Admitting: Physical Medicine and Rehabilitation

## 2021-01-24 VITALS — BP 144/86 | HR 87 | Temp 99.2°F | Ht 67.0 in | Wt 224.2 lb

## 2021-01-24 DIAGNOSIS — I612 Nontraumatic intracerebral hemorrhage in hemisphere, unspecified: Secondary | ICD-10-CM | POA: Diagnosis present

## 2021-01-24 DIAGNOSIS — I619 Nontraumatic intracerebral hemorrhage, unspecified: Secondary | ICD-10-CM | POA: Diagnosis present

## 2021-01-24 DIAGNOSIS — G8191 Hemiplegia, unspecified affecting right dominant side: Secondary | ICD-10-CM | POA: Diagnosis present

## 2021-01-24 DIAGNOSIS — G8110 Spastic hemiplegia affecting unspecified side: Secondary | ICD-10-CM | POA: Insufficient documentation

## 2021-01-24 MED ORDER — ONDANSETRON HCL 4 MG PO TABS: 4.0000 mg | ORAL_TABLET | Freq: Three times a day (TID) | ORAL | 5 refills | Status: DC | PRN

## 2021-01-24 MED ORDER — AMANTADINE HCL 100 MG PO CAPS: 200.0000 mg | ORAL_CAPSULE | Freq: Every day | ORAL | 3 refills | Status: DC

## 2021-01-24 MED ORDER — CITALOPRAM HYDROBROMIDE 20 MG PO TABS: 20.0000 mg | ORAL_TABLET | Freq: Every day | ORAL | 5 refills | Status: DC

## 2021-01-24 MED ORDER — BUSPIRONE HCL 5 MG PO TABS: 5.0000 mg | ORAL_TABLET | Freq: Three times a day (TID) | ORAL | 5 refills | Status: DC | PRN

## 2021-01-24 NOTE — Progress Notes (Addendum)
Subjective:    Patient ID: Felicia Chen, female    DOB: [birth year], 55 y.o.   MRN: [id number]  HPI  Pt is a 55 yr old female with hx of R hemiplegia due to stroke in 10/22- secondary to L frontal ICH and small SAH- with associated spasticity and post STROKE hypotension- was on Florinef and Midodrine in hospital.  Here for hospital f/u.    On Florinef 0.1 mg daily Stopped the Midodrine on 10/31.  Had H/H initially- getting therapy for the past 2 weeks.  Didn't say she was ready for outpt yet.   Thought today's appointment was for Botox.  Still taking amantadine  Ordered an R AFO- went through [employer].  Off the shelf from [place], so don't know if this is appropriate.   Is improving overall.  Is walking with help- using RW and then family follows her- no falls,  So hallway is 40 ft- so walks at least 40 ft 3x/day or so.    Has pain in R hand- with ROM- when grips-  Feels like arthritis pain.     Pain Inventory Average Pain 5 Pain Right Now 0 My pain is aching  LOCATION OF PAIN  hand  BOWEL Number of stools per week: 6 Oral laxative use Yes   BLADDER Normal   Mobility use a walker ability to climb steps?  no do you drive?  no use a wheelchair transfers alone  Function disabled: date disabled 12/01/20 I need assistance with the following:  dressing, bathing, toileting, meal prep, household duties, and shopping  Neuro/Psych trouble walking dizziness depression anxiety  Prior Studies Any changes since last visit?  no  Physicians involved in your care Any changes since last visit?  no   No family history on file. Social History   Socioeconomic History   Marital status: Married    Spouse name: Not on file   Number of children: Not on file   Years of education: Not on file   Highest education level: Not on file  Occupational History   Not on file  Tobacco Use   Smoking status: Never   Smokeless tobacco: Never  Vaping Use   Vaping  Use: Never used  Substance and Sexual Activity   Alcohol use: Yes    Comment: rarely   Drug use: No   Sexual activity: Yes    Partners: Male  Other Topics Concern   Not on file  Social History Narrative   Not on file   Social Determinants of Health   Financial Resource Strain: Not on file  Food Insecurity: Not on file  Transportation Needs: Not on file  Physical Activity: Not on file  Stress: Not on file  Social Connections: Not on file   Past Surgical History:  Procedure Laterality Date   ABDOMINAL HYSTERECTOMY N/A 06/11/2012   Procedure: HYSTERECTOMY ABDOMINAL;  Surgeon: Allena Katz, MD;  Location: [facility];  Service: Gynecology;  Laterality: N/A;   CESAREAN SECTION     x 4   CYSTOSCOPY N/A 06/11/2012   Procedure: CYSTOSCOPY;  Surgeon: Allena Katz, MD;  Location: [facility];  Service: Gynecology;  Laterality: N/A;   HERNIA REPAIR     LAPAROSCOPIC ASSISTED VAGINAL HYSTERECTOMY N/A 06/11/2012   Procedure: LAPAROSCOPIC ASSISTED VAGINAL HYSTERECTOMY atttempted;  Surgeon: Allena Katz, MD;  Location: [facility];  Service: Gynecology;  Laterality: N/A;  Attempted Laparoscopic assisted vaginal hysterectomy.   LAPAROSCOPIC LYSIS OF ADHESIONS N/A 06/11/2012   Procedure:  LAPAROSCOPIC LYSIS OF ADHESIONS;  Surgeon: Allena Katz, MD;  Location: [facility];  Service: Gynecology;  Laterality: N/A;   SALPINGOOPHORECTOMY Bilateral 06/11/2012   Procedure: SALPINGO OOPHORECTOMY;  Surgeon: Allena Katz, MD;  Location: [facility];  Service: Gynecology;  Laterality: Bilateral;   Past Medical History:  Diagnosis Date   Anxiety    Gilbert's syndrome    BP (!) 144/86 (BP Location: Left Arm)   Pulse 87   Temp 99.7 F (37.6 C)   Ht 5\' 7"  (1.702 m)   Wt 224 lb 3.2 oz (101.7 kg) Comment: wheel chair weight 43.8 (total today 268 lb)  SpO2 93%   BMI 35.11 kg/m   Opioid Risk Score:   Fall Risk Score:  `1  Depression screen PHQ 2/9  Depression screen PHQ 2/9 01/24/2021  Decreased  Interest 0  Down, Depressed, Hopeless 2  PHQ - 2 Score 2  Altered sleeping 0  Tired, decreased energy 3  Change in appetite 3  Feeling bad or failure about yourself  0  Trouble concentrating 0  Moving slowly or fidgety/restless 0  Suicidal thoughts 0  PHQ-9 Score 8    Review of Systems  Constitutional:  Positive for appetite change and unexpected weight change.  HENT: Negative.    Eyes: Negative.   Respiratory: Negative.    Cardiovascular:  Positive for leg swelling.  Gastrointestinal:  Positive for nausea.  Endocrine: Negative.   Genitourinary: Negative.   Musculoskeletal:  Positive for gait problem.  Skin: Negative.   Allergic/Immunologic: Negative.   Neurological:  Positive for dizziness and weakness.  Hematological: Negative.   Psychiatric/Behavioral:  Positive for dysphoric mood. The patient is nervous/anxious.   All other systems reviewed and are negative.     Objective:   Physical Exam  Awake alert, laughing because unhappy; accompanied by sister and husband, NAD Constantly fidgeting; esp with RUE And then tremor of RUE MS: RUE- biceps and triceps 4+/5; WE 4/5; grip 2/5; and finger abd 4-/5 RLE- HF 4/5; KE 4+/5; DF 3-/5 and PF 4/5 Mild/trace edema of ankles B/L  Neuro: MAS of 2 in R elbow- less so in RUE.  MAS of 2 in R knee- less so in hip and ankle-        Assessment & Plan:   Pt is a 55 yr old female with hx of R hemiplegia due to stroke in 10/22- secondary to L frontal ICH and small SAH- with associated spasticity and post STROKE hypotension- was on Florinef and Midodrine in hospital.  Here for hospital f/u.  Stop the Florinef- don't throw away- still monitor the BP's for at another 1 month to see if needs to restart it- if Bps stay controlled, then we can discard the Florinef.    2 If BP less than 100- the top number, can take Midodrine  5 mg x1 for those occasions- but NOT the Florinef.     3. Once therapists in home setting say she's ready for  outpt, then let me know.    4. Suggest changing Buspar to as needed basis; wasn't on Xanax-  keep Xanax if Buspar doesn't work.   5. Con't Celexa 20 mg daily for mood.    6. Make Zofran AS NEEDED- only if has nausea.  4 mg as needed for nausea- 5 refills sent in.    7. Educated on spasticity- want to hold off on Botox- since tone/spasticity can masquerade as strength- and I think it's also better  in [place], so  I don't want to make her weaker right now.    8. Can stop Protonix- since is for reflux symptoms.  Keep for as needed.   9. Cholesterol /statin medicine needs to come from PCP.    10. Voltaren gel over the counter- can use up to 4x/day.  For hand pain.    11. Suggest R foot up brace- can get from [employer]- return other brace she had. If that doesn't work, will need to be evaluated for a custom R foot AFO- will give pt Rx for it and send her for evaluation at [facility]- will place order, just in case. She needs for R foot drop due to stroke.   12. Elevation above heart 2x/day- for at least 30 minutes- to help swelling.   13. Increase amantadine to 200 mg daily- for motor planning/aphasia- can take less/100 mg if makes nauseated.    14. Can still use estim for hand- Right side- don't focus on shoulder- focus on hand.    15. F/U in 3 months-   I spent a total of  41 minutes on visit- talking with pt/spouse about spasticity and BP issues.

## 2021-01-24 NOTE — Patient Instructions (Signed)
Pt is a 55 yr old female with hx of R hemiplegia due to stroke in 10/22- secondary to L frontal ICH and small SAH- with associated spasticity and post STROKE hypotension- was on Florinef and Midodrine in hospital.  Here for hospital f/u.  Stop the Florinef- don't throw away- still monitor the BP's for at another 1 month to see if needs to restart it- if Bps stay controlled, then we can discard the Florinef.    2 If BP less than 100- the top number, can take Midodrine  5 mg x1 for those occasions- but NOT the Florinef.     3. Once therapists in home setting say she's ready for outpt, then let me know.    4. Suggest changing Buspar to as needed basis; wasn't on Xanax-  keep Xanax if Buspar doesn't work.   5. Con't Celexa 20 mg daily for mood.    6. Make Zofran AS NEEDED- only if has nausea.  4 mg as needed for nausea- 5 refills sent in.    7. Educated on spasticity- want to hold off on Botox- since tone/spasticity can masquerade as strength- and I think it's also better  in [place], so I don't want to make her weaker right now.    8. Can stop Protonix- since is for reflux symptoms.  Keep for as needed.   9. Cholesterol /statin medicine needs to come from PCP.    10. Voltaren gel over the counter- can use up to 4x/day.  For hand pain.    11. Suggest R foot up brace- can get from [employer]- return other brace she had.   12. Elevation above heart 2x/day- for at least 30 minutes- to help swelling.   13. Increase amantadine to 200 mg daily- for motor planning/aphasia- can take less/100 mg if makes nauseated.    14. Can still use estim for hand- Right side- don't focus on shoulder- focus on hand.    15. F/U in 3 months-

## 2021-01-27 DIAGNOSIS — I63512 Cerebral infarction due to unspecified occlusion or stenosis of left middle cerebral artery: Secondary | ICD-10-CM

## 2021-01-28 ENCOUNTER — Telehealth: Payer: Self-pay

## 2021-01-28 NOTE — Telephone Encounter (Signed)
Patient states that she needs PT, OT, and Speech so [facility] is fine

## 2021-01-28 NOTE — Telephone Encounter (Signed)
They just have PT- so it's up to them- ML

## 2021-01-28 NOTE — Telephone Encounter (Signed)
Patient's husband, [name], called to inform us that she was discharged from Home health PT and speech and will be discharged from OT next week. He was wanting to know if a referral can be place for outpatient PT. They want to know if the referral can be sent to the [place] location.

## 2021-02-08 ENCOUNTER — Other Ambulatory Visit: Payer: Self-pay

## 2021-02-08 ENCOUNTER — Encounter: Payer: Self-pay | Admitting: Adult Health

## 2021-02-08 ENCOUNTER — Ambulatory Visit (INDEPENDENT_AMBULATORY_CARE_PROVIDER_SITE_OTHER): Payer: 59 | Admitting: Adult Health

## 2021-02-08 VITALS — BP 129/78 | HR 96 | Wt 219.4 lb

## 2021-02-08 DIAGNOSIS — I619 Nontraumatic intracerebral hemorrhage, unspecified: Secondary | ICD-10-CM | POA: Diagnosis not present

## 2021-02-08 DIAGNOSIS — I67841 Reversible cerebrovascular vasoconstriction syndrome: Secondary | ICD-10-CM

## 2021-02-08 DIAGNOSIS — Z79899 Other long term (current) drug therapy: Secondary | ICD-10-CM | POA: Diagnosis not present

## 2021-02-08 DIAGNOSIS — E785 Hyperlipidemia, unspecified: Secondary | ICD-10-CM

## 2021-02-08 DIAGNOSIS — F418 Other specified anxiety disorders: Secondary | ICD-10-CM

## 2021-02-08 MED ORDER — BUPROPION HCL ER (XL) 150 MG PO TB24: 150.0000 mg | ORAL_TABLET | Freq: Every day | ORAL | 5 refills | Status: DC

## 2021-02-08 NOTE — Patient Instructions (Addendum)
We will plan on repeating imaging of your brain and head/neck vessels - you will be called to schedule  Start therapies as scheduled for hopeful ongoing recovery  Continue to follow with physical medicine and rehab regarding further recovery and any spasticity issues  Continue atorvastatin 80 mg daily for secondary stroke prevention  Continue to follow up with PCP regarding cholesterol and blood pressure management  Maintain strict control of hypertension with blood pressure goal below 130/90 and cholesterol with LDL cholesterol (bad cholesterol) goal below 70 mg/dL.   Would recommend decreased citalopram dose to 10mg  daily for 1 week then stop   We will plan to start bupropion 150mg  daily - please follow up with PCP for further management and monitoring      Followup in the future with me in 3 months or call earlier if needed       Thank you for coming to see [place] at [facility]. I hope we have been able to provide you high quality care today.  You may receive a patient satisfaction survey over the next few weeks. We would appreciate your feedback and comments so that we may continue to improve ourselves and the health of our patients.    Stroke Prevention Some medical conditions and lifestyle choices can lead to a higher risk for a stroke. You can help to prevent a stroke by eating healthy foods and exercising. It also helps to not smoke and to manage any health problems you may have. How can this condition affect me? A stroke is an emergency. It should be treated right away. A stroke can lead to brain damage or threaten your life. There is a better chance of surviving and getting better after a stroke if you get medical help right away. What can increase my risk? The following medical conditions may increase your risk of a stroke: Diseases of the heart and blood vessels (cardiovascular disease). High blood pressure (hypertension). Diabetes. High  cholesterol. Sickle cell disease. Problems with blood clotting. Being very overweight. Sleeping problems (obstructivesleep apnea). Other risk factors include: Being older than age 45. A history of blood clots, stroke, or mini-stroke (TIA). Race, ethnic background, or a family history of stroke. Smoking or using tobacco products. Taking birth control pills, especially if you smoke. Heavy alcohol and drug use. Not being active. What actions can I take to prevent this? Manage your health conditions High cholesterol. Eat a healthy diet. If this is not enough to manage your cholesterol, you may need to take medicines. Take medicines as told by your doctor. High blood pressure. Try to keep your blood pressure below 130/80. If your blood pressure cannot be managed through a healthy diet and regular exercise, you may need to take medicines. Take medicines as told by your doctor. Ask your doctor if you should check your blood pressure at home. Have your blood pressure checked every year. Diabetes. Eat a healthy diet and get regular exercise. If your blood sugar (glucose) cannot be managed through diet and exercise, you may need to take medicines. Take medicines as told by your doctor. Talk to your doctor about getting checked for sleeping problems. Signs of a problem can include: Snoring a lot. Feeling very tired. Make sure that you manage any other conditions you have. Nutrition  Follow instructions from your doctor about what to eat or drink. You may be told to: Eat and drink fewer calories each day. Limit how much salt (sodium) you use to 1,500 milligrams (mg) each  day. Use only healthy fats for cooking, such as olive oil, canola oil, and sunflower oil. Eat healthy foods. To do this: Choose foods that are high in fiber. These include whole grains, and fresh fruits and vegetables. Eat at least 5 servings of fruits and vegetables a day. Try to fill one-half of your plate with fruits  and vegetables at each meal. Choose low-fat (lean) proteins. These include low-fat cuts of meat, chicken without skin, fish, tofu, beans, and nuts. Eat low-fat dairy products. Avoid foods that: Are high in salt. Have saturated fat. Have trans fat. Have cholesterol. Are processed or pre-made. Count how many carbohydrates you eat and drink each day. Lifestyle If you drink alcohol: Limit how much you have to: 0-1 drink a day for women who are not pregnant. 0-2 drinks a day for men. Know how much alcohol is in your drink. In the [place] one drink equals one 12 oz bottle of beer (326mL), one 5 oz glass of wine (126mL), or one 1 oz glass of hard liquor (65mL). Do not smoke or use any products that have nicotine or tobacco. If you need help quitting, ask your doctor. Avoid secondhand smoke. Do not use drugs. Activity  Try to stay at a healthy weight. Get at least 30 minutes of exercise on most days, such as: Fast walking. Biking. Swimming. Medicines Take over-the-counter and prescription medicines only as told by your doctor. Avoid taking birth control pills. Talk to your doctor about the risks of taking birth control pills if: You are over 57 years old. You smoke. You get very bad headaches. You have had a blood clot. Where to find more information American Stroke Association: www.strokeassociation.org Get help right away if: You or a loved one has any signs of a stroke. "BE FAST" is an easy way to remember the warning signs: B - Balance. Dizziness, sudden trouble walking, or loss of balance. E - Eyes. Trouble seeing or a change in how you see. F - Face. Sudden weakness or loss of feeling of the face. The face or eyelid may droop on one side. A - Arms. Weakness or loss of feeling in an arm. This happens all of a sudden and most often on one side of the body. S - Speech. Sudden trouble speaking, slurred speech, or trouble understanding what people say. T - Time. Time to call  emergency services. Write down what time symptoms started. You or a loved one has other signs of a stroke, such as: A sudden, very bad headache with no known cause. Feeling like you may vomit (nausea). Vomiting. A seizure. These symptoms may be an emergency. Get help right away. Call your local emergency services (911 in the [place] Do not wait to see if the symptoms will go away. Do not drive yourself to the hospital. Summary You can help to prevent a stroke by eating healthy, exercising, and not smoking. It also helps to manage any health problems you have. Do not smoke or use any products that contain nicotine or tobacco. Get help right away if you or a loved one has any signs of a stroke. This information is not intended to replace advice given to you by your health care provider. Make sure you discuss any questions you have with your health care provider. Document Revised: 10/06/2019 Document Reviewed: 10/06/2019 Elsevier Patient Education  [place].

## 2021-02-08 NOTE — Progress Notes (Signed)
[facility] [address] [place]. [place] [address] [phone]       HOSPITAL FOLLOW UP NOTE  Felicia Chen Date of Birth:  September 10, 1965 Medical Record Number:  [id number]   Reason for Referral:  hospital stroke follow up    SUBJECTIVE:   CHIEF COMPLAINT:  Chief Complaint  Patient presents with   Hospitalization Follow-up    Room 2. Pt here with husband. Pt reports that she is doing well.     HPI:   Felicia Chen is a 55 y.o. female with history of significant for hyperlipidemia, obesity (BMI 39.5), and anxiety who presented on 12/01/2020 with confusion as well as headache in the setting of recent COVID infection.  Personally reviewed hospitalization pertinent progress notes, lab work and imaging.  Evaluated by Dr. 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  CTA head/neck no evidence of vascular lesion underlying the lobar hematoma although scattered segmental narrowing suspect RCVS or similar vasculopathy.  Questional possibility RCVS triggered by sertraline and COVID infection.  Recommended repeat CTA in 2 weeks to reevaluate for cerebral vasoconstriction versus atherosclerotic stenosis.  MRV and CTV no evidence of CVST.  Repeat CT head stable hematoma with mild SAH.  EF 60 to 65%.  TCD no evidence of vasospasm.  LDL 212.  A1c 5.4. UDS negative.  Cardiolipin antibodies negative.  No prior history of HTN, BP treated with Cleviprex and initiated losartan.  Initiate atorvastatin 80 mg daily.  No prior stroke history.  Hospital course complicated by a fall on 9/18 with right facial and eyelid bruising and edema - CT head with no change.  Sertraline discontinued due to suspicion of RCVS and ultimately placed on BuSpar and as needed Atarax and Xanax (until BuSpar kicks in).  Residual deficits of right hemiparesis and gait impairment.  Therapy eval's  recommended CIR for functional decline. During CIR admission, bouts of orthostasis with improvement after Florinef and ProAmatine and neurogenic bladder and Urecholine initiated.    Today, 02/08/2021, patient being seen for initial hospital follow-up accompanied by her husband and sister.    She has been making gradual improvement since discharge home Reports residual right hemiparesis and gait impairment -recently completed 3 weeks of HH PT/OT/SLP and scheduled for eval at neuro rehab by PT/OT/SLP on 11/30.  She does ambulate short distance with RW with 1 person assist otherwise will use w/c for long distance -denies any recent falls Does experience stiffness greater in hand and ankle (especially upon awakening) and tremor in RUE and RLE Believes her mood has been stable but still can experience some depression and anxiety.  She was started on citalopram during CIR admission and as needed Xanax and BuSpar. - she questions ongoing use of citalopram as her prior SSRI d/c'd due to possible cause of possible RCVS - she questions if there are any other options for treatment Lives with her husband who assists as needed - very supportive family Denies new stroke/TIA symptoms  Compliant on atorvastatin 80 mg daily -denies side effects Blood pressure today 129/78 - use of Florinef as needed but has not recently used Denies any smoking history, EtOH or drug use  Of note, sister mentions that she personally underwent embolization of cerebral aneurysm and father has hx of carotid stenosis requiring intervention.       PERTINENT IMAGING  Per recent hospitalization CT head 1. 60 cc left frontal parietal  lobar hematoma with regiona subarachnoid extension. CTA head & neck  . No spot sign or discrete vascular lesion underlying the lobar hematoma. 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 CTV negative for CVST MRI  brain : no evidence temporal  lobe ischemia as initially deszcribed on head CT. Continues to show stable size of left parietal IPH with subarachnoid extension overlying bilateral cerebral hemispheres are well as cerebellar regions. Mild edema, no midline shift.  MRV: no evidence CVST  CT head 9/18 -stable hematoma and mild SAH 2D Echo LVEF 60-65%, no regional wall abnormalities, LVH which is mild, grade I diastolic dysfunction TCDs no evidence of vasospasm  LDL 212 HgbA1c 5.4    ROS:   14 system review of systems performed and negative with exception of those listed in HPI  PMH:  Past Medical History:  Diagnosis Date   Anxiety    Gilbert's syndrome     PSH:  Past Surgical History:  Procedure Laterality Date   ABDOMINAL HYSTERECTOMY N/A 06/11/2012   Procedure: HYSTERECTOMY ABDOMINAL;  Surgeon: Allena Katz, MD;  Location: [facility];  Service: Gynecology;  Laterality: N/A;   CESAREAN SECTION     x 4   CYSTOSCOPY N/A 06/11/2012   Procedure: CYSTOSCOPY;  Surgeon: Allena Katz, MD;  Location: [facility];  Service: Gynecology;  Laterality: N/A;   HERNIA REPAIR     LAPAROSCOPIC ASSISTED VAGINAL HYSTERECTOMY N/A 06/11/2012   Procedure: LAPAROSCOPIC ASSISTED VAGINAL HYSTERECTOMY atttempted;  Surgeon: Allena Katz, MD;  Location: [facility];  Service: Gynecology;  Laterality: N/A;  Attempted Laparoscopic assisted vaginal hysterectomy.   LAPAROSCOPIC LYSIS OF ADHESIONS N/A 06/11/2012   Procedure: LAPAROSCOPIC LYSIS OF ADHESIONS;  Surgeon: Allena Katz, MD;  Location: [facility];  Service: Gynecology;  Laterality: N/A;   SALPINGOOPHORECTOMY Bilateral 06/11/2012   Procedure: SALPINGO OOPHORECTOMY;  Surgeon: Allena Katz, MD;  Location: [facility];  Service: Gynecology;  Laterality: Bilateral;    Social History:  Social History   Socioeconomic History   Marital status: Married    Spouse name: Not on file   Number of children: Not on file   Years of education: Not on file   Highest education level: Not on  file  Occupational History   Not on file  Tobacco Use   Smoking status: Never   Smokeless tobacco: Never  Vaping Use   Vaping Use: Never used  Substance and Sexual Activity   Alcohol use: Yes    Comment: rarely   Drug use: No   Sexual activity: Yes    Partners: Male  Other Topics Concern   Not on file  Social History Narrative   Not on file   Social Determinants of Health   Financial Resource Strain: Not on file  Food Insecurity: Not on file  Transportation Needs: Not on file  Physical Activity: Not on file  Stress: Not on file  Social Connections: Not on file  Intimate Partner Violence: Not on file    Family History: No family history on file.  Medications:   Current Outpatient Medications on File Prior to Visit  Medication Sig Dispense Refill   acetaminophen (TYLENOL) 325 MG tablet Take 2 tablets (650 mg total) by mouth every 4 (four) hours as needed for mild pain (or temp > 37.5 C (99.5 F)).     ALPRAZolam (XANAX) 0.5 MG tablet Take 1 tablet (0.5 mg total) by mouth 3 (three) times daily. 90 tablet  0   amantadine (SYMMETREL) 100 MG capsule Take 2 capsules (200 mg total) by mouth daily. 60 capsule 3   atorvastatin (LIPITOR) 80 MG tablet Take 1 tablet (80 mg total) by mouth daily. 30 tablet 0   busPIRone (BUSPAR) 5 MG tablet Take 1 tablet (5 mg total) by mouth 3 (three) times daily as needed (anxiety- take before xanax). 90 tablet 5   citalopram (CELEXA) 20 MG tablet Take 1 tablet (20 mg total) by mouth daily. 30 tablet 5   fludrocortisone (FLORINEF) 0.1 MG tablet Take 3 tablets (0.3 mg total) by mouth daily. (Patient taking differently: Take 0.3 mg by mouth daily as needed.) 90 tablet 1   ondansetron (ZOFRAN) 4 MG tablet Take 1 tablet (4 mg total) by mouth every 8 (eight) hours as needed for nausea or vomiting. 30 tablet 5   No current facility-administered medications on file prior to visit.    Allergies:  No Known Allergies    OBJECTIVE:  Physical  Exam  Vitals:   02/08/21 1101  BP: 129/78  Pulse: 96  Weight: 219 lb 6.4 oz (99.5 kg)   Body mass index is 34.36 kg/m. No results found.  Post stroke PHQ 2/9 Depression screen PHQ 2/9 01/24/2021  Decreased Interest 0  Down, Depressed, Hopeless 2  PHQ - 2 Score 2  Altered sleeping 0  Tired, decreased energy 3  Change in appetite 3  Feeling bad or failure about yourself  0  Trouble concentrating 0  Moving slowly or fidgety/restless 0  Suicidal thoughts 0  PHQ-9 Score 8     General: well developed, well nourished, very pleasant middle-age Caucasian female, seated, in no evident distress Head: head normocephalic and atraumatic.   Neck: supple with no carotid or supraclavicular bruits Cardiovascular: regular rate and rhythm, no murmurs Musculoskeletal: no deformity Skin:  no rash/petichiae Vascular:  Normal pulses all extremities   Neurologic Exam Mental Status: Awake and fully alert.  Fluent speech and language.  Oriented to place and time. Recent and remote memory intact. Attention span, concentration and fund of knowledge appropriate during visit. Mood and affect appropriate.  Cranial Nerves: Fundoscopic exam reveals sharp disc margins. Pupils equal, briskly reactive to light. Extraocular movements full without nystagmus. Visual fields full to confrontation. Hearing intact. Facial sensation intact.  Mild right lower facial weakness.  Tongue, and palate moves normally and symmetrically.  Motor: Normal strength, bulk and tone on left side RUE: 3/5 deltoid, 3+/5 elbow extension and flexion, 2/5 grip (although limited assessment due to pain) -increased tone throughout and mild tremor with activation RLE: 4/5 HF, KE and KF, 1/5 ADF and APF- mild tremor with activation Sensory.: intact to touch , pinprick , position and vibratory sensation.  Coordination: Rapid alternating movements normal on left side. Finger-to-nose and heel-to-shin performed accurately on left side. Gait and  Station: deferred as RW not present Reflexes: 2+ RUE and RLE, 1+ LUE and LLE. Toes downgoing.       NIHSS  3 Modified Rankin  3-4      ASSESSMENT: Felicia Chen is a 55 y.o. 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 Vascular risk factors include new dx of HTN, HLD, ?RCVS vs intracranial stenosis, COVID + 11/2020 and obesity.      PLAN:  L frontal ICH :  Residual deficit: Right spastic hemiparesis and gait impairment. Start outpatient PT/OT/SLP on 11/30. Discussed spasticity  and hopeful improvement with therapies. Followed by PMR Repeat MR brain w/wo contrast to assess for underlying structures possibly contributing Continue atorvastatin 80 mg daily for secondary stroke prevention.   Discussed secondary stroke prevention measures and importance of close PCP follow up for aggressive stroke risk factor management. I have gone over the pathophysiology of stroke, warning signs and symptoms, risk factors and their management in some detail with instructions to go to the closest emergency room for symptoms of concern. ?RCVS: felt possibly triggered by sertraline and COVID infection. Repeat CTA head/neck for re-evaluation and ensure no other vascular abnormalities especially with family history of cerebral aneurysm requiring embolization Depression with anxiety: slowly taper off citalopram and start Wellbutrin XR 150 mg daily. Request to f/u with PCP for further management. Would recommend avoiding SSRIs and SNRIs due to possible side effect of vasoconstriction. Continue PRN BuSpar and Xanax managed by PMR HTN: BP goal <130/90.  Stable.  Monitor by PCP HLD: LDL goal <70. Recent LDL 212.  Continue atorvastatin 80 mg daily. Repeat lipid panel. Request ongoing monitoring/management and rx'ing to PCP    Follow up in 3 months or call earlier if needed   CC:  [facility]  provider: Dr. Leonie Man PCP: Radene Ou, Bill Salinas, MD    I spent 59 minutes of face-to-face and non-face-to-face time with patient and family.  This included previsit chart review including review of recent hospitalization, lab review, study review, order entry, electronic health record documentation, patient and family education regarding recent stroke including potential etiology and indication for follow-up imaging, secondary stroke prevention measures and importance of managing stroke risk factors, residual deficits and typical recovery time and answered all other questions to patient and family's satisfaction   Frann Rider, AGNP-BC  [facility] [address] [place], [place] [address]  Phone [phone] Fax [phone] Note: This document was prepared with digital dictation and possible smart phrase technology. Any transcriptional errors that result from this process are unintentional.

## 2021-02-09 LAB — LIPID PANEL
Chol/HDL Ratio: 4.6 ratio — ABNORMAL HIGH (ref 0.0–4.4)
Cholesterol, Total: 187 mg/dL (ref 100–199)
HDL: 41 mg/dL (ref >39)
LDL Chol Calc (NIH): 112 mg/dL — ABNORMAL HIGH (ref 0–99)
Triglycerides: 197 mg/dL — ABNORMAL HIGH (ref 0–149)
VLDL Cholesterol Cal: 34 mg/dL (ref 5–40)

## 2021-02-09 LAB — COMPREHENSIVE METABOLIC PANEL
ALT: 39 IU/L — ABNORMAL HIGH (ref 0–32)
AST: 14 IU/L (ref 0–40)
Albumin/Globulin Ratio: 2.3 — ABNORMAL HIGH (ref 1.2–2.2)
Albumin: 5.1 g/dL — ABNORMAL HIGH (ref 3.8–4.9)
Alkaline Phosphatase: 75 IU/L (ref 44–121)
BUN/Creatinine Ratio: 23 (ref 9–23)
BUN: 15 mg/dL (ref 6–24)
Bilirubin Total: 2.4 mg/dL — ABNORMAL HIGH (ref 0.0–1.2)
CO2: 22 mmol/L (ref 20–29)
Calcium: 10.3 mg/dL — ABNORMAL HIGH (ref 8.7–10.2)
Chloride: 102 mmol/L (ref 96–106)
Creatinine, Ser: 0.64 mg/dL (ref 0.57–1.00)
Globulin, Total: 2.2 g/dL (ref 1.5–4.5)
Glucose: 105 mg/dL — ABNORMAL HIGH (ref 70–99)
Potassium: 4.6 mmol/L (ref 3.5–5.2)
Sodium: 142 mmol/L (ref 134–144)
Total Protein: 7.3 g/dL (ref 6.0–8.5)
eGFR: 103 mL/min/{1.73_m2} (ref >59)

## 2021-02-11 NOTE — Progress Notes (Signed)
I agree with the above plan 

## 2021-02-16 ENCOUNTER — Ambulatory Visit: Payer: 59 | Admitting: Occupational Therapy

## 2021-02-16 ENCOUNTER — Encounter: Payer: Self-pay | Admitting: Physical Therapy

## 2021-02-16 ENCOUNTER — Ambulatory Visit: Payer: 59 | Attending: Physical Medicine and Rehabilitation | Admitting: Physical Therapy

## 2021-02-16 ENCOUNTER — Other Ambulatory Visit: Payer: Self-pay

## 2021-02-16 ENCOUNTER — Ambulatory Visit: Payer: 59 | Admitting: Speech Pathology

## 2021-02-16 DIAGNOSIS — I69318 Other symptoms and signs involving cognitive functions following cerebral infarction: Secondary | ICD-10-CM | POA: Insufficient documentation

## 2021-02-16 DIAGNOSIS — I69351 Hemiplegia and hemiparesis following cerebral infarction affecting right dominant side: Secondary | ICD-10-CM

## 2021-02-16 DIAGNOSIS — R29818 Other symptoms and signs involving the nervous system: Secondary | ICD-10-CM | POA: Diagnosis present

## 2021-02-16 DIAGNOSIS — R41842 Visuospatial deficit: Secondary | ICD-10-CM

## 2021-02-16 DIAGNOSIS — R2681 Unsteadiness on feet: Secondary | ICD-10-CM

## 2021-02-16 DIAGNOSIS — R278 Other lack of coordination: Secondary | ICD-10-CM | POA: Insufficient documentation

## 2021-02-16 DIAGNOSIS — I63512 Cerebral infarction due to unspecified occlusion or stenosis of left middle cerebral artery: Secondary | ICD-10-CM | POA: Diagnosis not present

## 2021-02-16 DIAGNOSIS — R41841 Cognitive communication deficit: Secondary | ICD-10-CM | POA: Diagnosis present

## 2021-02-16 DIAGNOSIS — M6281 Muscle weakness (generalized): Secondary | ICD-10-CM | POA: Diagnosis present

## 2021-02-16 DIAGNOSIS — R2689 Other abnormalities of gait and mobility: Secondary | ICD-10-CM | POA: Diagnosis present

## 2021-02-16 NOTE — Therapy (Signed)
[facility] [address] [place], [place], [address] Phone: [phone]   Fax:  [phone]  Occupational Therapy Evaluation  Patient Details  Name: Felicia Chen MRN: [id number] Date of Birth: 21-Nov-1965 Referring Provider (OT): Dr. Courtney Heys   Encounter Date: 02/16/2021   OT End of Session - 02/16/21 1025     Visit Number 1    Number of Visits 25    Date for OT Re-Evaluation 05/17/21    Authorization Type [employer]: VL 40 per discipline, no auth required    OT Start Time 0848    OT Stop Time 0933    OT Time Calculation (min) 45 min    Activity Tolerance Patient tolerated treatment well    Behavior During Therapy [facility] for tasks assessed/performed             Past Medical History:  Diagnosis Date   Anxiety    Gilbert's syndrome     Past Surgical History:  Procedure Laterality Date   ABDOMINAL HYSTERECTOMY N/A 06/11/2012   Procedure: HYSTERECTOMY ABDOMINAL;  Surgeon: Allena Katz, MD;  Location: [facility];  Service: Gynecology;  Laterality: N/A;   CESAREAN SECTION     x 4   CYSTOSCOPY N/A 06/11/2012   Procedure: CYSTOSCOPY;  Surgeon: Allena Katz, MD;  Location: [facility];  Service: Gynecology;  Laterality: N/A;   HERNIA REPAIR     LAPAROSCOPIC ASSISTED VAGINAL HYSTERECTOMY N/A 06/11/2012   Procedure: LAPAROSCOPIC ASSISTED VAGINAL HYSTERECTOMY atttempted;  Surgeon: Allena Katz, MD;  Location: [facility];  Service: Gynecology;  Laterality: N/A;  Attempted Laparoscopic assisted vaginal hysterectomy.   LAPAROSCOPIC LYSIS OF ADHESIONS N/A 06/11/2012   Procedure: LAPAROSCOPIC LYSIS OF ADHESIONS;  Surgeon: Allena Katz, MD;  Location: [facility];  Service: Gynecology;  Laterality: N/A;   SALPINGOOPHORECTOMY Bilateral 06/11/2012   Procedure: SALPINGO OOPHORECTOMY;  Surgeon: Allena Katz, MD;  Location: [facility];  Service: Gynecology;  Laterality: Bilateral;    There were no vitals filed for this  visit.   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 PMH: HLD, anxiety, covid-19, Gilbert's syndrome    Limitations fall risk    Currently in Pain? Yes    Pain Score --   only when squeezing it   Pain Location Hand    Pain Orientation Right    Pain Type Acute pain    Pain Onset More than a month ago    Pain Frequency Intermittent    Aggravating Factors  squeezing/grasping    Pain Relieving Factors rest               [facility] OT Assessment - 02/16/21 0840       Assessment   Medical Diagnosis L CVA    Referring Provider (OT) Dr. Courtney Heys    Onset Date/Surgical Date 12/01/20    Hand Dominance Right    Next MD Visit 04/29/21    Prior Therapy Inpatient rehab 9/20-10/19/22, HH therapies x 3 weeks      Precautions   Precautions Fall      Balance Screen   Has the patient fallen in the past 6 months --   see P.T. eval     Home  Environment   Bathroom  Shower/Tub Gaffer;Door    Adaptive equipment --   w/c, BSC, walker   Additional Comments Pt lives w/ spouse, daughters, son, and 4 dogs in 2 story home with 1 steps to enter.    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 Being with her kids.      ADL   Eating/Feeding Modified independent   Rt hand w/ spills   Grooming Moderate assistance   Mod I w/ electric toothbrush, dep for brushing hair)   Upper Body Bathing Maximal assistance   seated (sponge bathing)   Lower Body Bathing Maximal assistance   seated (sponge bathing)   Upper Body Dressing Maximal assistance    Lower Body Dressing +1 Total aassistance    Toilet Transfer Minimal assistance     Toileting - Clothing Manipulation Moderate assistance   elastic pants   Toileting -  Hygiene --   wipes front, dep for back   Tub/Shower Transfer --   has not been doing since on 2nd floor, but has done x 1   ADL comments Dependent for all other IADLS other than below      IADL   Light Housekeeping --   folds laundry     Mobility   Mobility Status Comments walker in the house, w/c for community      Written Expression   Dominant Hand Right    Handwriting 25% legible;Severe micrographia   name in print only, overlapping letters     Vision - History   Baseline Vision Wears glasses all the time    Additional Comments denies change, possible Rt inattention      Cognition   Overall Cognitive Status Cognition to be further assessed in functional context PRN    Cognition Comments possible short term memory deficits but reports improving      Sensation   Additional Comments light touch and localization intact Rt hand only when RT side stimulated, however unable to detect when Lt side stimulated as well      Coordination   Gross Motor Movements are Fluid and Coordinated No    Fine Motor Movements are Fluid and Coordinated No    9 Hole Peg Test Right;Left    Right 9 Hole Peg Test 1 min. 45 sec   unable to manipulate peg in hand and often needed Lt hand to assist   Left 9 Hole Peg Test 30.62 sec    Other Pt w/ no true tremor but shakes slightly Rt hand when using      Perception   Perception Impaired    Inattention/Neglect --   ? does not attend fully to Rt side     Praxis   Praxis Not tested      Edema   Edema mild Rt hand      ROM / Strength   AROM / PROM / Strength AROM;Strength      AROM   Overall AROM Comments LUE AROM WNLs, RUE approx 90%  ROM except sh IR and wrist ext apporx 75%, 90% composite finger flexion      Strength   Overall Strength Comments Overall 3-/5 RUE      Hand Function   Right Hand Grip (lbs) 22    Left Hand Grip (lbs) 65  OT Short Term Goals - 02/16/21 1030       OT SHORT TERM GOAL #1   Title Independent with HEP for RUE shoulder ROM, hand coordination and hand strength    Time 4    Period Weeks    Status New      OT SHORT TERM GOAL #2   Title Pt to perform all BADLS with no more than min assist    Time 4    Period Weeks    Status New      OT SHORT TERM GOAL #3   Title Pt to write name and address with 50% or greater legibility    Time 4    Period Weeks    Status New      OT SHORT TERM GOAL #4   Title Pt to prepare simple snack, sandwich, and microwaveable items consistently using DME prn with distant supervision only    Time 4    Period Weeks    Status New      OT SHORT TERM GOAL #5   Title Pt to improve coordination as evidenced by performing 9 hole peg test Rt hand in under 1 min. and 15 sec    Baseline 1 min. 45 sec    Time 4    Period Weeks    Status New      Additional Short Term Goals   Additional Short Term Goals Yes      OT SHORT TERM GOAL #6   Title Pt to grip strength Rt hand to 30 lbs or greater in prep for opening jars    Baseline 22 lbs (Lt = 65 lbs)    Time 4    Period Weeks    Status New               OT Long Term Goals - 02/16/21 1034       OT LONG TERM GOAL #1   Title Independent with strengthening HEP for Rt shoulder    Time 12    Period Weeks    Status New      OT LONG TERM GOAL #2   Title Pt to fully attend to Rt side and return to using Rt hand as dominant hand 90% of the time safely for ADLS w/ task modifications prn    Time 12    Period Weeks    Status New      OT LONG TERM GOAL #3   Title Independent with all BADLS    Time 12    Period Weeks    Status New      OT LONG TERM GOAL #4   Title Pt to perform simple cooking tasks with supervision/min cueing only    Time 12    Period Weeks    Status New      OT LONG TERM GOAL #5   Title Improve coordination Rt hand as evidenced by performing 9  hole peg test in 45 sec or less    Baseline 1 min. 45 sec.    Time 12    Period Weeks    Status New      Long Term Additional Goals   Additional Long Term Goals Yes      OT LONG TERM GOAL #6   Title Pt to improve grip strength Rt hand to 40 lbs or greater to open jars/containers    Baseline 22 lbs (Lt = 65 lbs)    Time 12    Period Weeks  Status New      OT LONG TERM GOAL #7   Title Pt to write name at 90% legibility and 3 sentences at 75% or greater legibility    Time 12    Period Weeks    Status New                   Plan - 02/16/21 1026     Clinical Impression Statement Pt is a 55 yo female [facility] with intense headache, R hemiplegia. CTH/MRI shows 60 cc left frontal parietal lobar hematoma with SAH extending over bilat cerebral hemispheres and cerebellum, L temporal lobe ischemic CVA. Pt also with covid-19. PMH includes anxiety, Gilbert's syndrome, obesity, HLD. Pt presents today with decreased participation in [place], decreased RUE ROM, coordination, and strength, decreased sensation and attention to Rt side. Pt would benefit from O.T. to address these deficits and increase participation in [place].    OT Occupational Profile and History Detailed Assessment- Review of Records and additional review of physical, cognitive, psychosocial history related to current functional performance    Occupational performance deficits (Please refer to evaluation for details): ADL's;IADL's;Work;Leisure    Body Structure / Function / Physical Skills ADL;Strength;Dexterity;Balance;Tone;Body mechanics;Edema;Proprioception;UE functional use;IADL;ROM;Endurance;Mobility;Coordination;Sensation;FMC;Decreased knowledge of use of DME    Cognitive Skills Attention;Memory;Perception    Rehab Potential Good    Clinical Decision Making Several treatment options, min-mod task modification necessary    Comorbidities Affecting Occupational Performance: May have comorbidities impacting  occupational performance    Modification or Assistance to Complete Evaluation  No modification of tasks or assist necessary to complete eval    OT Frequency 2x / week    OT Duration 12 weeks   PLUS EVAL   OT Treatment/Interventions Self-care/ADL training;DME and/or AE instruction;Moist Heat;Therapeutic activities;Compression bandaging;Splinting;Therapeutic exercise;Ultrasound;Cognitive remediation/compensation;Coping strategies training;Visual/perceptual remediation/compensation;Passive range of motion;Neuromuscular education;Electrical Stimulation;Cryotherapy;Manual Therapy;Patient/family education    Plan HEP for high level shoulder ROM (supine if needed) and coordination HEP for Rt hand, practice writing and issue tan foam prn    Consulted and Agree with Plan of Care Patient;Family member/caregiver    Family Member Consulted husband and daughter             Patient will benefit from skilled therapeutic intervention in order to improve the following deficits and impairments:   Body Structure / Function / Physical Skills: ADL, Strength, Dexterity, Balance, Tone, Body mechanics, Edema, Proprioception, UE functional use, IADL, ROM, Endurance, Mobility, Coordination, Sensation, FMC, Decreased knowledge of use of DME Cognitive Skills: Attention, Memory, Perception     Visit Diagnosis: Hemiplegia and hemiparesis following cerebral infarction affecting right dominant side (HCC)  Other lack of coordination  Muscle weakness (generalized)  Unsteadiness on feet  Visuospatial deficit  Other symptoms and signs involving cognitive functions following cerebral infarction    Problem List Patient Active Problem List   Diagnosis Date Noted   Nausea without vomiting    Spastic hemiparesis (HCC)    History of hypertension    Neurogenic orthostatic hypotension (HCC)    Transaminitis    Right hemiplegia ([place])    Hemorrhagic stroke ([place])    Leucocytosis    Essential hypertension     Dyslipidemia    Right hemiparesis ([place])    ICH (intracerebral hemorrhage) ([place]) 12/01/2020   HYPERLIPIDEMIA 07/06/2009   OBESITY 07/06/2009    Carey Bullocks, OTR/L 02/16/2021, 10:39 AM  [facility] [address] [place], [place], [address] Phone: [phone]   Fax:  [phone]  Name: EDIN SKARDA MRN: [id number] Date of Birth: 1965-05-11

## 2021-02-16 NOTE — Therapy (Addendum)
[facility] [address] [place], [place], [address] Phone: [phone]   Fax:  [phone]  Physical Therapy Evaluation  Patient Details  Name: Felicia Chen MRN: [id number] Date of Birth: Aug 22, 1965 Referring Provider (PT): Dr. Dagoberto Ligas   Encounter Date: 02/16/2021   PT End of Session - 02/16/21 1311     Visit Number 1    Number of Visits 25    Date for PT Re-Evaluation 05/17/21    Authorization Type [employer] -40 visit limit per discipline?    PT Start Time 0803    PT Stop Time 0846    PT Time Calculation (min) 43 min    Equipment Utilized During Treatment Gait belt    Activity Tolerance Patient tolerated treatment well    Behavior During Therapy WFL for tasks assessed/performed             Past Medical History:  Diagnosis Date   Anxiety    Gilbert's syndrome     Past Surgical History:  Procedure Laterality Date   ABDOMINAL HYSTERECTOMY N/A 06/11/2012   Procedure: HYSTERECTOMY ABDOMINAL;  Surgeon: Allena Katz, MD;  Location: [facility];  Service: Gynecology;  Laterality: N/A;   CESAREAN SECTION     x 4   CYSTOSCOPY N/A 06/11/2012   Procedure: CYSTOSCOPY;  Surgeon: Allena Katz, MD;  Location: [facility];  Service: Gynecology;  Laterality: N/A;   HERNIA REPAIR     LAPAROSCOPIC ASSISTED VAGINAL HYSTERECTOMY N/A 06/11/2012   Procedure: LAPAROSCOPIC ASSISTED VAGINAL HYSTERECTOMY atttempted;  Surgeon: Allena Katz, MD;  Location: [facility];  Service: Gynecology;  Laterality: N/A;  Attempted Laparoscopic assisted vaginal hysterectomy.   LAPAROSCOPIC LYSIS OF ADHESIONS N/A 06/11/2012   Procedure: LAPAROSCOPIC LYSIS OF ADHESIONS;  Surgeon: Allena Katz, MD;  Location: [facility];  Service: Gynecology;  Laterality: N/A;   SALPINGOOPHORECTOMY Bilateral 06/11/2012   Procedure: SALPINGO OOPHORECTOMY;  Surgeon: Allena Katz, MD;  Location: [facility];  Service: Gynecology;  Laterality: Bilateral;     There were no vitals filed for this visit.    Subjective Assessment - 02/16/21 0806     Subjective Present to the ER on 12/01/20 with intense headache, R hemiplegia. CTH/MRI shows 60 cc left frontal parietal lobar hematoma with SAH extending over bilat cerebral hemispheres and cerebellum, L temporal lobe ischemic CVA. Received inpatient rehab, discharged from hospital 01/05/21. In the hospital had some issues with low BP/orthostatics. BP is much better now.  Received HH PT, OT, and ST for 3 weeks. Has been walking small distances with at home with a RW. Has trouble with her RLE bumping into the RW. Bought a foot brace from [employer] (reports it helps but not sure if she is putting it on correctly). Had an assisted fall with her daughter when pt lost her balance.    Patient is accompained by: Family member   daughter Felicia Chen, husband Felicia Chen   Pertinent History anxiety, Gilbert's syndrome, obesity, HLD.    Limitations Walking;House hold activities;Standing    How long can you walk comfortably? 10 minutes    Patient Stated Goals wants to get back to normal.    Currently in Pain? Other (Comment)   reports some pain in her hand but just when she tries to squeeze it.               [facility] PT Assessment - 02/16/21 0815       Assessment   Medical Diagnosis L CVA  Referring Provider (PT) Dr. Dagoberto Ligas    Onset Date/Surgical Date 12/01/20    Hand Dominance Right    Prior Therapy PT/OT/ST inpatient rehab ( 9/20-10/19/22) and [facility]      Precautions   Precautions Fall      Balance Screen   Has the patient fallen in the past 6 months Yes    How many times? 2    Has the patient had a decrease in activity level because of a fear of falling?  No    Is the patient reluctant to leave their home because of a fear of falling?  No      Home Environment   Living Environment Private residence    Living Arrangements Spouse/significant other;Children   has 5 kids, has 4 dogs   Available Help at Discharge  Family    Type of [place] to enter    Entrance Stairs-Number of Steps 1    [place] Two level    Alternate Level Stairs-Number of Steps 20   has a landing   Alternate Level Stairs-Rails Left;Right    [place] - 2 wheels;Wheelchair - manual;Shower seat;Bedside commode   Has a bed Academic librarian. 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 Being with her kids.      Observation/Other Assessments   Focus on Therapeutic Outcomes (FOTO)  58   71 predicted     Sensation   Light Touch Impaired Detail    Light Touch Impaired Details Impaired RLE   pt reports feels different on R   Proprioception Impaired Detail    Proprioception Impaired Details Impaired RLE   with ankle DF/PF, got 3 out of 5 responses, takes incr time to respond.     Coordination   Gross Motor Movements are Fluid and Coordinated No    Heel Shin Test impaired with RLE due to hemiparesis      Posture/Postural Control   Posture/Postural Control Postural limitations    Postural Limitations Rounded Shoulders      Tone   Assessment Location Right Lower Extremity      ROM / Strength   AROM / PROM / Strength Strength      Strength   Strength Assessment Site Hip;Knee;Ankle    Right/Left Hip Right;Left    Right Hip Flexion 4/5    Left Hip Flexion 5/5    Right/Left Knee Right;Left    Right Knee Flexion 4-/5    Right Knee Extension 4+/5    Left Knee Flexion 5/5    Left Knee Extension 5/5    Right/Left Ankle Right;Left    Right Ankle Dorsiflexion 2+/5    Left Ankle Dorsiflexion 5/5      Bed Mobility   Bed Mobility Supine to Sit;Sit to Supine    Supine to Sit Supervision/Verbal cueing    Sit to Supine Supervision/Verbal cueing      Transfers   Transfers Sit to Stand;Stand to Sit;Stand  Pivot Transfers    Sit to Stand 4: Min guard    Five time sit to stand comments  23.69 seconds with UE support from mat and RW, pt with decr weight shift to RLE and weight bearing in standing at times with pt performing with a more narrow BOS, decr eccentric  control.    Stand to Sit 4: Min guard    Stand Pivot Transfers 4: Min guard   with RW from w/c > mat table     Ambulation/Gait   Ambulation/Gait Yes    Ambulation/Gait Assistance 4: Min guard    Ambulation Distance (Feet) 100 Feet    Assistive device Rolling walker    Gait Pattern Step-through pattern;Decreased stance time - right;Decreased step length - left;Decreased hip/knee flexion - right;Decreased dorsiflexion - right;Decreased weight shift to right;Poor foot clearance - right;Right foot flat;Wide base of support;Abducted- right    Ambulation Surface Level;Indoor    Gait velocity 36.47 seconds = .90 ft/sec    Gait Comments Pt bumping RLE into RW at times due to incr external rotation to help clear leg during swing phase. Foot held in externally rotated position.      RLE Tone   RLE Tone Mild   pt reports incr toe curling with gait.                       Objective measurements completed on examination: See above findings.                PT Education - 02/17/21 0835     Education Details Clinical findings, POC. Pt reporting using E-stim in the hospital to R ankle DF and reported good response and pt asking about use of e-stim here. Educated/discussed purpose of Bioness and can trial in a future session for strengthening/gait and to see if pt will have a good response.    Person(s) Educated Patient;Child(ren);Spouse    Methods Explanation    Comprehension Verbalized understanding              PT Short Term Goals - 02/17/21 1201       PT SHORT TERM GOAL #1   Title Pt will be independent with initial HEP in with family supervision in order to build upon functional gains made in therapy. ALL STGS  DUE 03/17/21    Time 4    Period Weeks    Status New    Target Date 03/17/21      PT SHORT TERM GOAL #2   Title Pt will undergo further assessment of BERG with LTG written as appropriate.    Time 4    Period Weeks    Status New      PT SHORT TERM GOAL #3   Title Pt will improve gait speed to at least 1.25 ft/sec in order to demo improved household mobility.    Baseline .90 ft/sec with RW    Time 4    Period Weeks    Status New      PT SHORT TERM GOAL #4   Title Pt will ambulate at least 71' with RW and supervision in order to demo improved household mobility.    Time 4    Period Weeks    Status New      PT SHORT TERM GOAL #5   Title Pt will perform 5x sit <> stand with UE support in 21 seconds or less with supervision in order to demo decr fall risk.    Baseline 23.69 seconds with min guard    Time 4    Period Weeks    Status New      Additional Short Term Goals   Additional Short Term Goals Yes      PT SHORT TERM GOAL #6   Title --    Time --  Period --    Status --               PT Long Term Goals - 02/17/21 1205       PT LONG TERM GOAL #1   Title Pt will be independent with final HEP in with family supervision in order to build upon functional gains made in therapy. ALL LTGS DUE 04/14/21    Time 8    Period Weeks    Status New    Target Date 04/14/21      PT LONG TERM GOAL #2   Title BERG goal written to demo decr fall risk.    Time 8    Period Weeks    Status New      PT LONG TERM GOAL #3   Title Pt will ambulate at least 300' outdoors with RW and supervision in order to demo improved community mobility.    Time 8    Period Weeks    Status New      PT LONG TERM GOAL #4   Title Pt will go up and down 12 stairs with single handrail with supervision/min guard with step to pattern in order to safely go up/down the stairs in her house.    Baseline not yet assessed.    Time 8    Period Weeks    Status New      PT LONG TERM GOAL #5   Title  Pt will perform 5x sit <> stand with UE support in 16 seconds or less with supervision in order to demo decr fall risk.    Baseline 23.69 seconds with min guard    Time 8    Period Weeks    Status New      Additional Long Term Goals   Additional Long Term Goals Yes      PT LONG TERM GOAL #6   Title Pt will improve gait speed to at least 1.9 ft/sec in order to demo improved household mobility and decr fall risk.    Baseline .11 ft/sec with RW    Time 8    Period Weeks    Status New                    Plan - 02/17/21 1214     Clinical Impression Statement Pt is a 55 yo female who presented to [facility] on 9/14 with intense headache, R hemiplegia. CTH/MRI shows 60 cc left frontal parietal lobar hematoma with SAH extending over bilat cerebral hemispheres and cerebellum, L temporal lobe ischemic CVA. PMH includes anxiety, Gilbert's syndrome, obesity, HLD. Pt received inpatient rehab and [facility] therapies. The following deficits were present during the exam: decr strength, RLE hemiplegia, inattetntion, impaired coordination, impaired RLE proprioception/sensation, gait abnormalities, incr tone, impaired balance, decr motor planning, postural abnormalities. Based on gait speed and 5x sit <> stand, pt is an incr risk for falls. Pt currently needs min guard with sit <> stands with RW and performs with narrow BOS with decr RLE weight shift. Pt with gait pattern (listed above in eval) and needs min guard for gait with RW. Pt would benefit from skilled PT to address these impairments and functional limitations to maximize functional mobility independence and decr fall risk.    Personal Factors and Comorbidities Comorbidity 3+;Past/Current Experience;Time since onset of injury/illness/exacerbation    Comorbidities anxiety, Gilbert's syndrome, obesity, HLD    Examination-Activity Limitations Bathing;Bend;Caring for Others;Toileting;Stand;Stairs;Squat;Reach Overhead;Locomotion Level;Transfers     Examination-Participation Restrictions Cleaning;Community Activity;Driving;Medication Management  Stability/Clinical Decision Making Evolving/Moderate complexity    Clinical Decision Making Moderate    Rehab Potential Good    PT Frequency 2x / week    PT Duration 12 weeks    PT Treatment/Interventions ADLs/Self Care Home Management;Electrical Stimulation;DME Instruction;Gait training;Stair training;Functional mobility training;Therapeutic activities;Aquatic Therapy;Therapeutic exercise;Balance training;Neuromuscular re-education;Manual techniques;Orthotic Fit/Training;Patient/family education;Passive range of motion;Energy conservation;Vestibular;Visual/perceptual remediation/compensation    PT Next Visit Plan initiate HEP for strengthening, ROM, RLE weight bearing. Sit <> Stand transfers with incr RLE weight bearing. RLE NMR. Gait training with RW, trial different AFOs. Trial Bioness.    Consulted and Agree with Plan of Care Patient;Family member/caregiver             Patient will benefit from skilled therapeutic intervention in order to improve the following deficits and impairments:  Abnormal gait, Decreased activity tolerance, Decreased balance, Decreased cognition, Decreased endurance, Decreased coordination, Decreased range of motion, Decreased safety awareness, Decreased strength, Hypomobility, Increased edema, Impaired sensation, Postural dysfunction, Impaired flexibility, Impaired tone, Difficulty walking, Impaired UE functional use, Impaired vision/preception  Visit Diagnosis: Unsteadiness on feet  Hemiplegia and hemiparesis following cerebral infarction affecting right dominant side (HCC)  Other abnormalities of gait and mobility  Other symptoms and signs involving the nervous system  Muscle weakness (generalized)     Problem List Patient Active Problem List   Diagnosis Date Noted   Nausea without vomiting    Spastic hemiparesis (HCC)    History of hypertension     Neurogenic orthostatic hypotension (HCC)    Transaminitis    Right hemiplegia ([place])    Hemorrhagic stroke (HCC)    Leucocytosis    Essential hypertension    Dyslipidemia    Right hemiparesis (HCC)    ICH (intracerebral hemorrhage) ([place]) 12/01/2020   HYPERLIPIDEMIA 07/06/2009   OBESITY 07/06/2009    Arliss Journey, PT, DPT  02/17/2021, 12:21 PM  [facility] [address] [place], [place], [address] Phone: [phone]   Fax:  [phone]  Name: KELLYJO EDGREN MRN: [id number] Date of Birth: 11-10-1965

## 2021-02-16 NOTE — Patient Instructions (Signed)
   3 ring binder   Pill organizer - remember them   Tips to help facilitate better attention, concentration, focus   Do harder, longer tasks when you are most alert/awake  Break down larger tasks into small parts  Limit distractions of TV, radio, conversation, e mails/texts, appliance noise, etc - if a job is important, do it in a quiet room  Be aware of how you are functioning in high stimulation environments such as large stores, parties, restaurants - any place with lots of lights, noise, signs etc  Group conversations may be more difficult to process than one on one conversations  Give yourself extra time to process conversation, reading materials, directions or information from your healthcare providers  Organization is key - clutters of laundry, mail, paperwork, dirty dishes - all make it more difficult to concentrate  Before you start a task, have all the needed supplies, directions, recipes ready and organized. This way you don't have to go looking for something in the middle of a task and become distracted.   Be aware of fatigue - take rests or breaks when needed to re-group and re-focus

## 2021-02-16 NOTE — Therapy (Signed)
[facility] [address] [place], [place], [address] Phone: [phone]   Fax:  [phone]  Speech Language Pathology Evaluation  Patient Details  Name: SARELY STRACENER MRN: [id number] Date of Birth: 1966-02-12 Referring Provider (SLP): Frann Rider NP   Encounter Date: 02/16/2021   End of Session - 02/16/21 1532     Visit Number 1    Number of Visits 25    Date for SLP Re-Evaluation 05/11/21    Authorization Type UHC, 40 per discipline per notes    Authorization - Visit Number 1    Authorization - Number of Visits 40    SLP Start Time 0930    SLP Stop Time  1015    SLP Time Calculation (min) 45 min    Activity Tolerance Patient tolerated treatment well             Past Medical History:  Diagnosis Date   Anxiety    Gilbert's syndrome     Past Surgical History:  Procedure Laterality Date   ABDOMINAL HYSTERECTOMY N/A 06/11/2012   Procedure: HYSTERECTOMY ABDOMINAL;  Surgeon: Allena Katz, MD;  Location: [facility];  Service: Gynecology;  Laterality: N/A;   CESAREAN SECTION     x 4   CYSTOSCOPY N/A 06/11/2012   Procedure: CYSTOSCOPY;  Surgeon: Allena Katz, MD;  Location: [facility];  Service: Gynecology;  Laterality: N/A;   HERNIA REPAIR     LAPAROSCOPIC ASSISTED VAGINAL HYSTERECTOMY N/A 06/11/2012   Procedure: LAPAROSCOPIC ASSISTED VAGINAL HYSTERECTOMY atttempted;  Surgeon: Allena Katz, MD;  Location: [facility];  Service: Gynecology;  Laterality: N/A;  Attempted Laparoscopic assisted vaginal hysterectomy.   LAPAROSCOPIC LYSIS OF ADHESIONS N/A 06/11/2012   Procedure: LAPAROSCOPIC LYSIS OF ADHESIONS;  Surgeon: Allena Katz, MD;  Location: [facility];  Service: Gynecology;  Laterality: N/A;   SALPINGOOPHORECTOMY Bilateral 06/11/2012   Procedure: SALPINGO OOPHORECTOMY;  Surgeon: Allena Katz, MD;  Location: [facility];  Service: Gynecology;  Laterality: Bilateral;    There were no vitals  filed for this visit.   Subjective Assessment - 02/16/21 0936     Subjective "I had a brain bleed"    Patient is accompained by: Family member   [name], spouse, [name] - daughter   Currently in Pain? 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 SYMIA HERDT is a 55 y.o. female with history of significant for hyperlipidemia, obesity (BMI 39.5), and anxiety who presented on 12/01/2020 with confusion as well as headache in the setting of recent COVID infection.  Personally reviewed hospitalization pertinent progress notes, lab work and imaging.  Evaluated by Dr. 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, etiology unclear, possibly HTN vs small vessel disease given multiple risk factors vs ?RCVS  Mobility Status PT eval today      Prior Functional Status   Cognitive/Linguistic Baseline Within functional limits    Type of Home House     Lives With Family;Spouse    Available Support Family    Vocation Part time employment      Cognition   Overall Cognitive Status Impaired/Different from baseline    Area of Impairment Attention;Memory;Awareness    Current Attention Level Sustained    Memory Decreased short-term memory    Awareness Intellectual    Attention Alternating;Divided    Alternating Attention Impaired    Alternating Attention Impairment Verbal complex;Functional complex    Divided Attention Impaired    Divided Attention Impairment Verbal basic;Functional basic    Memory Impaired     Memory Impairment Storage deficit;Decreased recall of new information;Decreased short term memory    Awareness Impaired    Awareness Impairment Emergent impairment    Executive Function Sequencing;Self Monitoring;Self Correcting    Behaviors Impulsive      Auditory Comprehension   Overall Auditory Comprehension Appears within functional limits for tasks assessed      Verbal Expression   Overall Verbal Expression Appears within functional limits for tasks assessed      Written Expression   Dominant Hand Right    Written Expression Not tested      Oral Motor/Sensory Function   Overall Oral Motor/Sensory Function Appears within functional limits for tasks assessed      Motor Speech   Overall Motor Speech Appears within functional limits for tasks assessed      Standardized Assessments   Standardized Assessments  Cognitive Linguistic Quick Test      Cognitive Linguistic Quick Test (Ages 18-69)   Attention Mild    Memory WNL    Executive Function Mild    Language WNL    Visuospatial Skills Mild    Severity Rating Total 17    Composite Severity Rating 14.6                             SLP Education - 02/16/21 1532     Education Details areas of impairment, goals, compensations for attention    Person(s) Educated Patient;Spouse;Child(ren)    Methods Explanation;Verbal cues;Handout    Comprehension Verbal cues required;Need further instruction              SLP Short Term Goals - 02/16/21 1545       SLP SHORT TERM GOAL #1   Title Pt will be independent in mediation management with no missed meds over 1 week    Time 4    Period Weeks    Status New      SLP SHORT TERM GOAL #2   Title Pt will carryover 2 compensations for attention to complete 2 IADL's over 1 week with rare min A    Time 4    Period Weeks    Status New      SLP SHORT TERM GOAL #3   Title Pt will carryover compensations for attention, processing and sequencing to complete light  meal prep    Time 4    Period Weeks    Status New      SLP SHORT TERM GOAL #4   Title Pt will keep agenda or calendar to manage her daily appointments and to do list with rare min A from family    Time 4    Period Weeks    Status New  SLP Long Term Goals - 02/16/21 1548       SLP LONG TERM GOAL #1   Title Pt will carryover 3 compensations for attention and error awareness to pay 3 bills with rare min A from family    Time 12    Period Weeks    Status New      SLP LONG TERM GOAL #2   Title Pt will ID and self correct 80% of errors on cognitive linguistic tasks with rare min A    Time 12    Period Weeks    Status New      SLP LONG TERM GOAL #3   Title Pt will complete 3 executive function tasks with 90% accuracy and rare min A    Time 12    Period Weeks    Status New              Plan - 02/16/21 1533     Clinical Impression Statement Cathey Fredenburg is referred for outpt ST due to cognitive impairments s/p L hemorrhagic CVA. Today, she is accompanied by her spouse, [name] and her daughter, [name]. Icess reports some slowness in thinking and processing. Her family report some difficulty sequencing and missing some steps in household tasks. 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 Linzee also managed her household with several children at home ages 17-28. At this time, her daguther is managing her medications and her spouse is managing finances.  She reports she is sending shorter, more simple texts that she used to,do in part to R UE weakness. The Cognitive Function PROM was filled out with concensus from [place] and her family. Her score was 118. They noted the most difficulty with: checking accuracy of financial documents, getting things organized, making simple mistakes, difficulty doing more than 1 thing at a time, working hard to pay attention, trouble concentrating and difficulty planning out steps  in a task. Mikhaila frequently stated she did not have difficulty with items on the PROM, then family would explain that she was having difficulty, demonstrating redeuced awareness of deficits. On the Symbol Cancelation subtest of the CLQT, Calyse required multiple cues to cross through the symbol, as she was crossing in the space in between symbols. She selected the wrong symbols without awareness. Clock drawing had hands all the way to the left, drawing a line connecting the 10 and 11 (correct time should be 11:10). On maze, Zulay went through walls without awareness. The CLQT revealed mild attention, executive function and visuospatial skills. I recommend skilled ST to maximize cognition for return to PLOF, for safety and independence.    Speech Therapy Frequency 2x / week    Duration 12 weeks    Treatment/Interventions Environmental controls;Cueing hierarchy;SLP instruction and feedback;Compensatory strategies;Functional tasks;Cognitive reorganization;Compensatory techniques;Internal/external aids;Multimodal communcation approach;Patient/family education             Patient will benefit from skilled therapeutic intervention in order to improve the following deficits and impairments:   Cognitive communication deficit    Problem List Patient Active Problem List   Diagnosis Date Noted   Nausea without vomiting    Spastic hemiparesis (HCC)    History of hypertension    Neurogenic orthostatic hypotension (HCC)    Transaminitis    Right hemiplegia (HCC)    Hemorrhagic stroke (HCC)    Leucocytosis    Essential hypertension    Dyslipidemia    Right hemiparesis (HCC)    ICH (intracerebral hemorrhage) (  [place]) 12/01/2020   HYPERLIPIDEMIA 07/06/2009   OBESITY 07/06/2009    Giang Hemme, Annye Rusk, [place] 02/16/2021, 3:50 PM  [facility] [address] [place], [place], [address] Phone: [phone]   Fax:  [phone]  Name:  SHAMONE WINZER MRN: [id number] Date of Birth: 1966-03-19

## 2021-02-17 ENCOUNTER — Other Ambulatory Visit (HOSPITAL_BASED_OUTPATIENT_CLINIC_OR_DEPARTMENT_OTHER): Payer: Self-pay

## 2021-02-17 ENCOUNTER — Ambulatory Visit: Payer: 59 | Attending: Internal Medicine

## 2021-02-17 DIAGNOSIS — Z23 Encounter for immunization: Secondary | ICD-10-CM

## 2021-02-17 MED ORDER — MODERNA COVID-19 BIVAL BOOSTER 50 MCG/0.5ML IM SUSP
INTRAMUSCULAR | 0 refills | Status: DC
  Filled 2021-02-17: qty 0.5, 1d supply, fill #0

## 2021-02-17 NOTE — Progress Notes (Signed)
   Covid-19 Vaccination Clinic  Name:  BILLIEJEAN SCHIMEK    MRN: [id number] DOB: 1965/07/22  02/17/2021  Ms. Leever was observed post Covid-19 immunization for 15 minutes without incident. She was provided with Vaccine Information Sheet and instruction to access the V-Safe system.   Ms. Trosper was instructed to call 911 with any severe reactions post vaccine: Difficulty breathing  Swelling of face and throat  A fast heartbeat  A bad rash all over body  Dizziness and weakness   Immunizations Administered     Name Date Dose VIS Date Route   Moderna Covid-19 vaccine Bivalent Booster 02/17/2021  9:37 AM 0.5 mL 10/30/2020 Intramuscular   Manufacturer: Moderna   Lot: [id number]   [place]: [id number]

## 2021-02-17 NOTE — Addendum Note (Signed)
Addended by: Arliss Journey on: 02/17/2021 12:23 PM   Modules accepted: Orders

## 2021-02-21 ENCOUNTER — Ambulatory Visit: Payer: 59

## 2021-02-21 ENCOUNTER — Other Ambulatory Visit: Payer: Self-pay

## 2021-02-21 ENCOUNTER — Ambulatory Visit: Payer: 59 | Attending: Physical Medicine and Rehabilitation | Admitting: Occupational Therapy

## 2021-02-21 DIAGNOSIS — I69318 Other symptoms and signs involving cognitive functions following cerebral infarction: Secondary | ICD-10-CM | POA: Diagnosis present

## 2021-02-21 DIAGNOSIS — R2689 Other abnormalities of gait and mobility: Secondary | ICD-10-CM | POA: Diagnosis present

## 2021-02-21 DIAGNOSIS — R278 Other lack of coordination: Secondary | ICD-10-CM | POA: Diagnosis present

## 2021-02-21 DIAGNOSIS — R41841 Cognitive communication deficit: Secondary | ICD-10-CM | POA: Diagnosis present

## 2021-02-21 DIAGNOSIS — R2681 Unsteadiness on feet: Secondary | ICD-10-CM | POA: Insufficient documentation

## 2021-02-21 DIAGNOSIS — I69351 Hemiplegia and hemiparesis following cerebral infarction affecting right dominant side: Secondary | ICD-10-CM | POA: Diagnosis not present

## 2021-02-21 DIAGNOSIS — M6281 Muscle weakness (generalized): Secondary | ICD-10-CM | POA: Diagnosis present

## 2021-02-21 DIAGNOSIS — R41842 Visuospatial deficit: Secondary | ICD-10-CM | POA: Diagnosis present

## 2021-02-21 NOTE — Patient Instructions (Addendum)
Write down 3 things you can work on at home that could aid your cognitive functioning and/or independence.

## 2021-02-21 NOTE — Therapy (Signed)
[facility] [address] [place], [place], [address] Phone: [phone]   Fax:  [phone]  Speech Language Pathology Treatment  Patient Details  Name: Felicia Chen MRN: [id number] Date of Birth: 12-13-1965 Referring Provider (SLP): Frann Rider NP   Encounter Date: 02/21/2021   End of Session - 02/21/21 1423     Visit Number 2    Number of Visits 25    Date for SLP Re-Evaluation 05/11/21    Authorization Type UHC, 40 per discipline per notes    Authorization - Visit Number 2    Authorization - Number of Visits 17    SLP Start Time 1149   pt arrived late   SLP Stop Time  1230    SLP Time Calculation (min) 41 min    Activity Tolerance Patient tolerated treatment well             Past Medical History:  Diagnosis Date   Anxiety    Gilbert's syndrome     Past Surgical History:  Procedure Laterality Date   ABDOMINAL HYSTERECTOMY N/A 06/11/2012   Procedure: HYSTERECTOMY ABDOMINAL;  Surgeon: Allena Katz, MD;  Location: [facility];  Service: Gynecology;  Laterality: N/A;   CESAREAN SECTION     x 4   CYSTOSCOPY N/A 06/11/2012   Procedure: CYSTOSCOPY;  Surgeon: Allena Katz, MD;  Location: [facility];  Service: Gynecology;  Laterality: N/A;   HERNIA REPAIR     LAPAROSCOPIC ASSISTED VAGINAL HYSTERECTOMY N/A 06/11/2012   Procedure: LAPAROSCOPIC ASSISTED VAGINAL HYSTERECTOMY atttempted;  Surgeon: Allena Katz, MD;  Location: [facility];  Service: Gynecology;  Laterality: N/A;  Attempted Laparoscopic assisted vaginal hysterectomy.   LAPAROSCOPIC LYSIS OF ADHESIONS N/A 06/11/2012   Procedure: LAPAROSCOPIC LYSIS OF ADHESIONS;  Surgeon: Allena Katz, MD;  Location: [facility];  Service: Gynecology;  Laterality: N/A;   SALPINGOOPHORECTOMY Bilateral 06/11/2012   Procedure: SALPINGO OOPHORECTOMY;  Surgeon: Allena Katz, MD;  Location: [facility];  Service: Gynecology;  Laterality: Bilateral;    There were  no vitals filed for this visit.   Subjective Assessment - 02/21/21 1416     Subjective "I notice cognitive issues going on"    Patient is accompained by: Family member   husband and daughter   Currently in Pain? No/denies    Pain Score --   fluctuates   Pain Location Hand    Pain Orientation Right                   ADULT SLP TREATMENT - 02/21/21 1150       General Information   Behavior/Cognition Alert;Cooperative;Pleasant mood      Treatment Provided   Treatment provided Cognitive-Linquistic      Cognitive-Linquistic Treatment   Treatment focused on Cognition;Patient/family/caregiver education    Skilled Treatment Pt stated "I notice cognitive issues going on" and endorses she does forget her train of thought more frequently than usual. Pt's endorsed good family support, which may need to be scaffolded as pt has progressed and does not require same level of cognitive assistance. SLP generated strategy for pt to identify 3 ways she plans to decrease reliance on family and engage in more cognitively challenigng tasks. Handout provided to patient. Pt previously managed the finances, in which she reported some persistent difficulty with mental math. Overall adequate recall endorsed by patient and family. SLP initiated education and training of recommended compensatory strategies to aid daily cognitive functioning. HEP provided  today.      Assessment / Recommendations / Plan   Plan Continue with current plan of care      Progression Toward Goals   Progression toward goals Progressing toward goals              SLP Education - 02/21/21 1423     Education Details HEP, recommendations    Person(s) Educated Patient;Child(ren);Spouse    Methods Explanation;Demonstration;Handout    Comprehension Verbalized understanding;Returned demonstration;Need further instruction              SLP Short Term Goals - 02/21/21 1158       SLP SHORT TERM GOAL #1   Title Pt will be  independent in mediation management with no missed meds over 1 week    Time 4    Period Weeks    Status On-going      SLP SHORT TERM GOAL #2   Title Pt will carryover 2 compensations for attention to complete 2 IADL's over 1 week with rare min A    Time 4    Period Weeks    Status On-going      SLP SHORT TERM GOAL #3   Title Pt will carryover compensations for attention, processing and sequencing to complete light meal prep    Time 4    Period Weeks    Status On-going      SLP SHORT TERM GOAL #4   Title Pt will keep agenda or calendar to manage her daily appointments and to do list with rare min A from family    Time 4    Period Weeks    Status On-going              SLP Long Term Goals - 02/21/21 1426       SLP LONG TERM GOAL #1   Title Pt will carryover 3 compensations for attention and error awareness to pay 3 bills with rare min A from family    Time 12    Period Weeks    Status On-going      SLP LONG TERM GOAL #2   Title Pt will ID and self correct 80% of errors on cognitive linguistic tasks with rare min A    Time 12    Period Weeks    Status On-going      SLP LONG TERM GOAL #3   Title Pt will complete 3 executive function tasks with 90% accuracy and rare min A    Time 12    Period Weeks    Status On-going              Plan - 02/21/21 1424     Clinical Impression Statement Nayara Taplin is referred for outpt ST due to cognitive impairments s/p L hemorrhagic CVA. Today, she is accompanied by her spouse, [name] and her daughter, Terrence Dupont. Rahmah reports changes in thought organization. Her family report some difficulty sequencing and missing some steps in household tasks. 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 Rekisha also managed her household with several children at home ages 23-28. At this time, pt is beginning to manage her medications and her spouse is managing finances. Initiated education and  training of functional cognitive strategies and tasks to implement at home. HEP provided. I recommend skilled ST to maximize cognition for return to PLOF, for safety and independence.    Speech Therapy Frequency 2x / week    Duration 12 weeks  Treatment/Interventions Environmental controls;Cueing hierarchy;SLP instruction and feedback;Compensatory strategies;Functional tasks;Cognitive reorganization;Compensatory techniques;Internal/external aids;Multimodal communcation approach;Patient/family education             Patient will benefit from skilled therapeutic intervention in order to improve the following deficits and impairments:   Cognitive communication deficit    Problem List Patient Active Problem List   Diagnosis Date Noted   Nausea without vomiting    Spastic hemiparesis (HCC)    History of hypertension    Neurogenic orthostatic hypotension (HCC)    Transaminitis    Right hemiplegia (HCC)    Hemorrhagic stroke ([place])    Leucocytosis    Essential hypertension    Dyslipidemia    Right hemiparesis (HCC)    ICH (intracerebral hemorrhage) ([place]) 12/01/2020   HYPERLIPIDEMIA 07/06/2009   OBESITY 07/06/2009    Alinda Deem, CCC-SLP 02/21/2021, 2:27 PM  [facility] [address] [place], [place], [address] Phone: [phone]   Fax:  [phone]   Name: AREEJ TAYLER MRN: [id number] Date of Birth: [birth year]

## 2021-02-21 NOTE — Patient Instructions (Signed)
  Coordination Activities  Perform the following activities for 10 minutes 1-2 times per day with right hand(s).  Toss ball in air and catch with the same hand and/or between hands Flip cards 1 at a time as fast as you can. Deal cards with your thumb (Hold deck in hand and push card off top with thumb) - keep elbow on table Rotate 1 card in hand (clockwise and counter-clockwise). If you can't, flip card over and over Pick up coins and place in container or coin bank. Pick up coins one at a time until you get 5 in your hand, then move coins from palm to fingertips to stack one at a time. Practice writing (work on spacing between [employer]) and/or typing - forearm supported on table

## 2021-02-22 NOTE — Therapy (Signed)
[facility] [address] [place], [place], [address] Phone: [phone]   Fax:  [phone]  Occupational Therapy Treatment  Patient Details  Name: Felicia Chen MRN: [id number] Date of Birth: 1965-07-09 Referring Provider (OT): Dr. Courtney Heys   Encounter Date: 02/21/2021   OT End of Session - 02/22/21 0755     Visit Number 2    Number of Visits 25    Date for OT Re-Evaluation 05/17/21    Authorization Type UHC: VL 40 per discipline, no auth required    OT Start Time 1230    OT Stop Time 1315    OT Time Calculation (min) 45 min    Activity Tolerance Patient tolerated treatment well    Behavior During Therapy [facility] for tasks assessed/performed             Past Medical History:  Diagnosis Date   Anxiety    Gilbert's syndrome     Past Surgical History:  Procedure Laterality Date   ABDOMINAL HYSTERECTOMY N/A 06/11/2012   Procedure: HYSTERECTOMY ABDOMINAL;  Surgeon: Allena Katz, MD;  Location: [facility];  Service: Gynecology;  Laterality: N/A;   CESAREAN SECTION     x 4   CYSTOSCOPY N/A 06/11/2012   Procedure: CYSTOSCOPY;  Surgeon: Allena Katz, MD;  Location: [facility];  Service: Gynecology;  Laterality: N/A;   HERNIA REPAIR     LAPAROSCOPIC ASSISTED VAGINAL HYSTERECTOMY N/A 06/11/2012   Procedure: LAPAROSCOPIC ASSISTED VAGINAL HYSTERECTOMY atttempted;  Surgeon: Allena Katz, MD;  Location: [facility];  Service: Gynecology;  Laterality: N/A;  Attempted Laparoscopic assisted vaginal hysterectomy.   LAPAROSCOPIC LYSIS OF ADHESIONS N/A 06/11/2012   Procedure: LAPAROSCOPIC LYSIS OF ADHESIONS;  Surgeon: Allena Katz, MD;  Location: [facility];  Service: Gynecology;  Laterality: N/A;   SALPINGOOPHORECTOMY Bilateral 06/11/2012   Procedure: SALPINGO OOPHORECTOMY;  Surgeon: Allena Katz, MD;  Location: [facility];  Service: Gynecology;  Laterality: Bilateral;    There were no vitals filed for this  visit.   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 PMH: HLD, anxiety, covid-19, Gilbert's syndrome    Limitations fall risk    Currently in Pain? Yes    Pain Score --   fluctuates   Pain Location Hand    Pain Orientation Right    Pain Descriptors / Indicators Aching;Sore    Pain Type Acute pain    Pain Onset More than a month ago    Pain Frequency Intermittent    Aggravating Factors  squeezing/grasping, first thing in am    Pain Relieving Factors tylenol, voltaren gel             Discussed some visual perceptual deficits noted in SLP eval and how that may affect function. Recommended simple word searches and puzzles for home.   Issued coordination HEP for functional activities/coordination Rt hand. Practiced writing w/ built up pen and issued tan foam.  Pt w/ letters overlapping and difficulty spacing letters and approx 25% legibility. Recommended pt slide forearm slightly after each letter - pt did much better w/ significantly increased legibility of first name (however modified technique for spacing letters by bringing paper closer to her w/ Lt hand vs. Moving Rt forearm/ hand  further away)   Line by section test: pt appeared WFL's except for longest line - detected slight Rt inattention. May be observed more in functional tasks and wider scanning environments.                      OT Education - 02/22/21 0759     Education Details coordination HEP    Person(s) Educated Patient;Spouse;Child(ren)    Methods Explanation;Demonstration;Verbal cues;Handout    Comprehension Verbalized understanding;Returned demonstration;Verbal cues required;Need further instruction              OT Short Term Goals - 02/16/21 1030        OT SHORT TERM GOAL #1   Title Independent with HEP for RUE shoulder ROM, hand coordination and hand strength    Time 4    Period Weeks    Status New      OT SHORT TERM GOAL #2   Title Pt to perform all BADLS with no more than min assist    Time 4    Period Weeks    Status New      OT SHORT TERM GOAL #3   Title Pt to write name and address with 50% or greater legibility    Time 4    Period Weeks    Status New      OT SHORT TERM GOAL #4   Title Pt to prepare simple snack, sandwich, and microwaveable items consistently using DME prn with distant supervision only    Time 4    Period Weeks    Status New      OT SHORT TERM GOAL #5   Title Pt to improve coordination as evidenced by performing 9 hole peg test Rt hand in under 1 min. and 15 sec    Baseline 1 min. 45 sec    Time 4    Period Weeks    Status New      Additional Short Term Goals   Additional Short Term Goals Yes      OT SHORT TERM GOAL #6   Title Pt to grip strength Rt hand to 30 lbs or greater in prep for opening jars    Baseline 22 lbs (Lt = 65 lbs)    Time 4    Period Weeks    Status New               OT Long Term Goals - 02/16/21 1034       OT LONG TERM GOAL #1   Title Independent with strengthening HEP for Rt shoulder    Time 12    Period Weeks    Status New      OT LONG TERM GOAL #2   Title Pt to fully attend to Rt side and return to using Rt hand as dominant hand 90% of the time safely for ADLS w/ task modifications prn    Time 12    Period Weeks    Status New      OT LONG TERM GOAL #3   Title Independent with all BADLS    Time 12    Period Weeks    Status New      OT LONG TERM GOAL #4   Title Pt to perform simple cooking tasks with supervision/min cueing only    Time 12    Period Weeks    Status New      OT LONG TERM GOAL #5   Title Improve coordination Rt hand as  evidenced by performing 9 hole peg test in 45 sec or less    Baseline 1 min. 45 sec.    Time 12    Period Weeks     Status New      Long Term Additional Goals   Additional Long Term Goals Yes      OT LONG TERM GOAL #6   Title Pt to improve grip strength Rt hand to 40 lbs or greater to open jars/containers    Baseline 22 lbs (Lt = 65 lbs)    Time 12    Period Weeks    Status New      OT LONG TERM GOAL #7   Title Pt to write name at 90% legibility and 3 sentences at 75% or greater legibility    Time 12    Period Weeks    Status New                   Plan - 02/22/21 0756     Clinical Impression Statement Pt progressing with coordination. Pt w/ decreased awareness into deficits.    OT Occupational Profile and History Detailed Assessment- Review of Records and additional review of physical, cognitive, psychosocial history related to current functional performance    Occupational performance deficits (Please refer to evaluation for details): ADL's;IADL's;Work;Leisure    Body Structure / Function / Physical Skills ADL;Strength;Dexterity;Balance;Tone;Body mechanics;Edema;Proprioception;UE functional use;IADL;ROM;Endurance;Mobility;Coordination;Sensation;FMC;Decreased knowledge of use of DME    Cognitive Skills Attention;Memory;Perception    Rehab Potential Good    Clinical Decision Making Several treatment options, min-mod task modification necessary    Comorbidities Affecting Occupational Performance: May have comorbidities impacting occupational performance    Modification or Assistance to Complete Evaluation  No modification of tasks or assist necessary to complete eval    OT Frequency 2x / week    OT Duration 12 weeks   PLUS EVAL   OT Treatment/Interventions Self-care/ADL training;DME and/or AE instruction;Moist Heat;Therapeutic activities;Compression bandaging;Splinting;Therapeutic exercise;Ultrasound;Cognitive remediation/compensation;Coping strategies training;Visual/perceptual remediation/compensation;Passive range of motion;Neuromuscular education;Electrical  Stimulation;Cryotherapy;Manual Therapy;Patient/family education    Plan HEP for high level shoulder ROM (supine if needed), review coordination HEP, try 24 pc puzzle    Consulted and Agree with Plan of Care Patient;Family member/caregiver    Family Member Consulted husband and daughter             Patient will benefit from skilled therapeutic intervention in order to improve the following deficits and impairments:   Body Structure / Function / Physical Skills: ADL, Strength, Dexterity, Balance, Tone, Body mechanics, Edema, Proprioception, UE functional use, IADL, ROM, Endurance, Mobility, Coordination, Sensation, FMC, Decreased knowledge of use of DME Cognitive Skills: Attention, Memory, Perception     Visit Diagnosis: Hemiplegia and hemiparesis following cerebral infarction affecting right dominant side (HCC)  Other lack of coordination  Muscle weakness (generalized)  Visuospatial deficit    Problem List Patient Active Problem List   Diagnosis Date Noted   Nausea without vomiting    Spastic hemiparesis (HCC)    History of hypertension    Neurogenic orthostatic hypotension (HCC)    Transaminitis    Right hemiplegia ([place])    Hemorrhagic stroke ([place])    Leucocytosis    Essential hypertension    Dyslipidemia    Right hemiparesis ([facility])    ICH (intracerebral hemorrhage) ([place]) 12/01/2020   HYPERLIPIDEMIA 07/06/2009   OBESITY 07/06/2009    Carey Bullocks, OTR/L 02/22/2021, 7:59 AM  [facility] [address] [place], [place], [address] Phone: [phone]  Fax:  [phone]  Name: TASHALA CUMBO MRN: [id number] Date of Birth: Aug 10, 1965

## 2021-02-23 ENCOUNTER — Encounter: Payer: 59 | Admitting: Occupational Therapy

## 2021-02-23 ENCOUNTER — Encounter: Payer: 59 | Admitting: Speech Pathology

## 2021-02-25 ENCOUNTER — Ambulatory Visit: Payer: 59

## 2021-02-25 ENCOUNTER — Other Ambulatory Visit: Payer: Self-pay

## 2021-02-25 ENCOUNTER — Encounter: Payer: Self-pay | Admitting: Physical Therapy

## 2021-02-25 ENCOUNTER — Encounter: Payer: Self-pay | Admitting: Occupational Therapy

## 2021-02-25 ENCOUNTER — Ambulatory Visit: Payer: 59 | Admitting: Occupational Therapy

## 2021-02-25 ENCOUNTER — Ambulatory Visit: Payer: 59 | Admitting: Physical Therapy

## 2021-02-25 DIAGNOSIS — R2681 Unsteadiness on feet: Secondary | ICD-10-CM

## 2021-02-25 DIAGNOSIS — I69318 Other symptoms and signs involving cognitive functions following cerebral infarction: Secondary | ICD-10-CM

## 2021-02-25 DIAGNOSIS — R41841 Cognitive communication deficit: Secondary | ICD-10-CM

## 2021-02-25 DIAGNOSIS — R41842 Visuospatial deficit: Secondary | ICD-10-CM

## 2021-02-25 DIAGNOSIS — R2689 Other abnormalities of gait and mobility: Secondary | ICD-10-CM

## 2021-02-25 DIAGNOSIS — M6281 Muscle weakness (generalized): Secondary | ICD-10-CM

## 2021-02-25 DIAGNOSIS — I69351 Hemiplegia and hemiparesis following cerebral infarction affecting right dominant side: Secondary | ICD-10-CM

## 2021-02-25 DIAGNOSIS — R278 Other lack of coordination: Secondary | ICD-10-CM

## 2021-02-25 NOTE — Therapy (Signed)
[facility] [address] [place], [place], [address] Phone: [phone]   Fax:  [phone]  Speech Language Pathology Treatment  Patient Details  Name: Felicia Chen MRN: [id number] Date of Birth: 1966/02/17 Referring Provider (SLP): Frann Rider NP   Encounter Date: 02/25/2021   End of Session - 02/25/21 0848     Visit Number 3    Number of Visits 25    Date for SLP Re-Evaluation 05/11/21    Authorization Type UHC, 40 per discipline per notes    Authorization - Visit Number 3    Authorization - Number of Visits 54    SLP Start Time [phone]   pt arrived late   SLP Stop Time  0930    SLP Time Calculation (min) 41 min    Activity Tolerance Patient tolerated treatment well             Past Medical History:  Diagnosis Date   Anxiety    Gilbert's syndrome     Past Surgical History:  Procedure Laterality Date   ABDOMINAL HYSTERECTOMY N/A 06/11/2012   Procedure: HYSTERECTOMY ABDOMINAL;  Surgeon: Allena Katz, MD;  Location: [facility];  Service: Gynecology;  Laterality: N/A;   CESAREAN SECTION     x 4   CYSTOSCOPY N/A 06/11/2012   Procedure: CYSTOSCOPY;  Surgeon: Allena Katz, MD;  Location: [facility];  Service: Gynecology;  Laterality: N/A;   HERNIA REPAIR     LAPAROSCOPIC ASSISTED VAGINAL HYSTERECTOMY N/A 06/11/2012   Procedure: LAPAROSCOPIC ASSISTED VAGINAL HYSTERECTOMY atttempted;  Surgeon: Allena Katz, MD;  Location: [facility];  Service: Gynecology;  Laterality: N/A;  Attempted Laparoscopic assisted vaginal hysterectomy.   LAPAROSCOPIC LYSIS OF ADHESIONS N/A 06/11/2012   Procedure: LAPAROSCOPIC LYSIS OF ADHESIONS;  Surgeon: Allena Katz, MD;  Location: [facility];  Service: Gynecology;  Laterality: N/A;   SALPINGOOPHORECTOMY Bilateral 06/11/2012   Procedure: SALPINGO OOPHORECTOMY;  Surgeon: Allena Katz, MD;  Location: [facility];  Service: Gynecology;  Laterality: Bilateral;    There were  no vitals filed for this visit.   Subjective Assessment - 02/25/21 0849     Subjective "it's been busy"    Patient is accompained by: Family member    Currently in Pain? Yes    Pain Score 2     Pain Location Hand    Pain Orientation Right    Pain Descriptors / Indicators --   stiff                  ADULT SLP TREATMENT - 02/25/21 0848       General Information   Behavior/Cognition Alert;Cooperative;Pleasant mood      Treatment Provided   Treatment provided Cognitive-Linquistic      Cognitive-Linquistic Treatment   Treatment focused on Cognition;Patient/family/caregiver education    Skilled Treatment Pt has been implementing daily to-do lists of up to 17 items per day. Pt is typing lists and dividing tasks between self and children. To do lists have been effective. SLP recommended using notes in phone with bullet points to check off tasks completed to aid attention. 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 Pt did report episode of breakdown, in which SLP educated and instructed conservation strateiges, including structured breaks. Pt verbalized understanding. SLP reviewed HEP, in which pt idenitfied two of  three errors independently. SLP recommended double check.      Assessment / Recommendations / Plan   Plan Continue with current plan of care      Progression Toward Goals   Progression toward goals Progressing toward goals              SLP Education - 02/25/21 1016     Education Details energy conservation strategies, double check work    [employer]) Educated Patient;Child(ren);Caregiver(s)    Methods Explanation;Demonstration;Handout    Comprehension Verbalized understanding;Returned demonstration;Need further instruction              SLP Short Term Goals - 02/21/21 1158       SLP SHORT TERM GOAL #1   Title  Pt will be independent in mediation management with no missed meds over 1 week    Time 4    Period Weeks    Status On-going      SLP SHORT TERM GOAL #2   Title Pt will carryover 2 compensations for attention to complete 2 IADL's over 1 week with rare min A    Time 4    Period Weeks    Status On-going      SLP SHORT TERM GOAL #3   Title Pt will carryover compensations for attention, processing and sequencing to complete light meal prep    Time 4    Period Weeks    Status On-going      SLP SHORT TERM GOAL #4   Title Pt will keep agenda or calendar to manage her daily appointments and to do list with rare min A from family    Time 4    Period Weeks    Status On-going              SLP Long Term Goals - 02/21/21 1426       SLP LONG TERM GOAL #1   Title Pt will carryover 3 compensations for attention and error awareness to pay 3 bills with rare min A from family    Time 12    Period Weeks    Status On-going      SLP LONG TERM GOAL #2   Title Pt will ID and self correct 80% of errors on cognitive linguistic tasks with rare min A    Time 12    Period Weeks    Status On-going      SLP LONG TERM GOAL #3   Title Pt will complete 3 executive function tasks with 90% accuracy and rare min A    Time 12    Period Weeks    Status On-going              Plan - 02/25/21 0849     Clinical Impression Statement [name] is referred for outpt ST due to cognitive impairments s/p L hemorrhagic CVA. Today, she is accompanied by her sister and her daughter. 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 Artesha also managed her household with several children at home ages 5-28. At this time, pt is beginning to manage her medications and her spouse is managing finances. Pt has been implementing daily to do lists with success. Conducted ongoing education and training of functional cognitive strategies and tasks to implement at home.  Energy conservation strategies discussed today. HEP provided. I recommend skilled ST to maximize cognition for return to PLOF, for safety and independence.    Speech Therapy Frequency 2x / week  Duration 12 weeks    Treatment/Interventions Environmental controls;Cueing hierarchy;SLP instruction and feedback;Compensatory strategies;Functional tasks;Cognitive reorganization;Compensatory techniques;Internal/external aids;Multimodal communcation approach;Patient/family education             Patient will benefit from skilled therapeutic intervention in order to improve the following deficits and impairments:   Cognitive communication deficit    Problem List Patient Active Problem List   Diagnosis Date Noted   Nausea without vomiting    Spastic hemiparesis (HCC)    History of hypertension    Neurogenic orthostatic hypotension (HCC)    Transaminitis    Right hemiplegia (HCC)    Hemorrhagic stroke ([place])    Leucocytosis    Essential hypertension    Dyslipidemia    Right hemiparesis (HCC)    ICH (intracerebral hemorrhage) ([place]) 12/01/2020   HYPERLIPIDEMIA 07/06/2009   OBESITY 07/06/2009    Alinda Deem, [place] 02/25/2021, 10:18 AM  [facility] [address] [place], [place], [address] Phone: [phone]   Fax:  [phone]   Name: RAEVYN SOKOL MRN: [id number] Date of Birth: 29-Sep-1965

## 2021-02-25 NOTE — Patient Instructions (Addendum)
Keep up daily to-do lists. Using the Notes section with bullet points in your phone may be helpful.  Download the app [employer] and set up account for next Monday (14 day free trial)

## 2021-02-25 NOTE — Therapy (Signed)
[facility] [address] [place], [place], [address] Phone: [phone]   Fax:  [phone]  Occupational Therapy Treatment  Patient Details  Name: Felicia Chen MRN: [id number] Date of Birth: [birth year] Referring Provider (OT): Dr. Courtney Heys   Encounter Date: 02/25/2021   OT End of Session - 02/25/21 0932     Visit Number 3    Number of Visits 25    Date for OT Re-Evaluation 05/17/21    Authorization Type UHC: VL 40 per discipline, no auth required    OT Start Time 0930    OT Stop Time 1015    OT Time Calculation (min) 45 min    Activity Tolerance Patient tolerated treatment well    Behavior During Therapy [facility] for tasks assessed/performed             Past Medical History:  Diagnosis Date   Anxiety    Gilbert's syndrome     Past Surgical History:  Procedure Laterality Date   ABDOMINAL HYSTERECTOMY N/A 06/11/2012   Procedure: HYSTERECTOMY ABDOMINAL;  Surgeon: Allena Katz, MD;  Location: [facility];  Service: Gynecology;  Laterality: N/A;   CESAREAN SECTION     x 4   CYSTOSCOPY N/A 06/11/2012   Procedure: CYSTOSCOPY;  Surgeon: Allena Katz, MD;  Location: [facility];  Service: Gynecology;  Laterality: N/A;   HERNIA REPAIR     LAPAROSCOPIC ASSISTED VAGINAL HYSTERECTOMY N/A 06/11/2012   Procedure: LAPAROSCOPIC ASSISTED VAGINAL HYSTERECTOMY atttempted;  Surgeon: Allena Katz, MD;  Location: [facility];  Service: Gynecology;  Laterality: N/A;  Attempted Laparoscopic assisted vaginal hysterectomy.   LAPAROSCOPIC LYSIS OF ADHESIONS N/A 06/11/2012   Procedure: LAPAROSCOPIC LYSIS OF ADHESIONS;  Surgeon: Allena Katz, MD;  Location: [facility];  Service: Gynecology;  Laterality: N/A;   SALPINGOOPHORECTOMY Bilateral 06/11/2012   Procedure: SALPINGO OOPHORECTOMY;  Surgeon: Allena Katz, MD;  Location: [facility];  Service: Gynecology;  Laterality: Bilateral;    There were no vitals filed for this  visit.   Subjective Assessment - 02/25/21 0931     Subjective  most of my pain comes from stiffness in the morning    Patient is accompanied by: Family member   husband and daughter   Pertinent History ICH 12/01/20 Lt frontal/parietal lobes w/ SAH extending over bilat cerebral hemispheres and cerebellum, Lt temporal lobe ischemic CVA w/ residual Rt hemiplegia. PMH: HLD, anxiety, covid-19, Gilbert's syndrome    Limitations fall risk    Currently in Pain? Yes    Pain Score 2     Pain Location Hand    Pain Orientation Right    Pain Descriptors / Indicators Tightness    Pain Type Acute pain    Pain Onset More than a month ago    Pain Frequency Intermittent               MVPT 38/39  97% accuracy with 1 error in visual closure category. Pt freq looked to sister and daughter for verification of answers however unable to receive help during assessment.   24 pc puzzle with increased time but did well with attending to detials of the pieces and using visual aid as guide. Pt req'd min verbal cues for attending to use of RUE and for attending to picture and at times orientation of pieces for mental flexibility.   Seated Cane/Dowel Exercises shoulder flexion and chest press x 10 reps each while seated in wheelchair. Cues for not  using trunk extension for compensation.                     OT Short Term Goals - 02/25/21 1004       OT SHORT TERM GOAL #1   Title Independent with HEP for RUE shoulder ROM, hand coordination and hand strength    Time 4    Period Weeks    Status On-going      OT SHORT TERM GOAL #2   Title Pt to perform all BADLS with no more than min assist    Time 4    Period Weeks    Status On-going      OT SHORT TERM GOAL #3   Title Pt to write name and address with 50% or greater legibility    Time 4    Period Weeks    Status New      OT SHORT TERM GOAL #4   Title Pt to prepare simple snack, sandwich, and microwaveable items consistently using DME  prn with distant supervision only    Time 4    Period Weeks    Status New      OT SHORT TERM GOAL #5   Title Pt to improve coordination as evidenced by performing 9 hole peg test Rt hand in under 1 min. and 15 sec    Baseline 1 min. 45 sec    Time 4    Period Weeks    Status New      OT SHORT TERM GOAL #6   Title Pt to grip strength Rt hand to 30 lbs or greater in prep for opening jars    Baseline 22 lbs (Lt = 65 lbs)    Time 4    Period Weeks    Status On-going               OT Long Term Goals - 02/16/21 1034       OT LONG TERM GOAL #1   Title Independent with strengthening HEP for Rt shoulder    Time 12    Period Weeks    Status New      OT LONG TERM GOAL #2   Title Pt to fully attend to Rt side and return to using Rt hand as dominant hand 90% of the time safely for ADLS w/ task modifications prn    Time 12    Period Weeks    Status New      OT LONG TERM GOAL #3   Title Independent with all BADLS    Time 12    Period Weeks    Status New      OT LONG TERM GOAL #4   Title Pt to perform simple cooking tasks with supervision/min cueing only    Time 12    Period Weeks    Status New      OT LONG TERM GOAL #5   Title Improve coordination Rt hand as evidenced by performing 9 hole peg test in 45 sec or less    Baseline 1 min. 45 sec.    Time 12    Period Weeks    Status New      Long Term Additional Goals   Additional Long Term Goals Yes      OT LONG TERM GOAL #6   Title Pt to improve grip strength Rt hand to 40 lbs or greater to open jars/containers    Baseline 22 lbs (Lt = 65 lbs)  Time 12    Period Weeks    Status New      OT LONG TERM GOAL #7   Title Pt to write name at 90% legibility and 3 sentences at 75% or greater legibility    Time 12    Period Weeks    Status New                   Plan - 02/25/21 1030     Clinical Impression Statement Pt with some challenges with 24 pc puzzle but demonstrates ok visual perceptual skills  based on MVPT with increased time.    OT Occupational Profile and History Detailed Assessment- Review of Records and additional review of physical, cognitive, psychosocial history related to current functional performance    Occupational performance deficits (Please refer to evaluation for details): ADL's;IADL's;Work;Leisure    Body Structure / Function / Physical Skills ADL;Strength;Dexterity;Balance;Tone;Body mechanics;Edema;Proprioception;UE functional use;IADL;ROM;Endurance;Mobility;Coordination;Sensation;FMC;Decreased knowledge of use of DME    Cognitive Skills Attention;Memory;Perception    Rehab Potential Good    Clinical Decision Making Several treatment options, min-mod task modification necessary    Comorbidities Affecting Occupational Performance: May have comorbidities impacting occupational performance    Modification or Assistance to Complete Evaluation  No modification of tasks or assist necessary to complete eval    OT Frequency 2x / week    OT Duration 12 weeks   PLUS EVAL   OT Treatment/Interventions Self-care/ADL training;DME and/or AE instruction;Moist Heat;Therapeutic activities;Compression bandaging;Splinting;Therapeutic exercise;Ultrasound;Cognitive remediation/compensation;Coping strategies training;Visual/perceptual remediation/compensation;Passive range of motion;Neuromuscular education;Electrical Stimulation;Cryotherapy;Manual Therapy;Patient/family education    Plan review coordination HEP PRN, did not give HEP for shoulder but could use review and issue (already doing supine exercises at home per report)    Consulted and Agree with Plan of Care Patient;Family member/caregiver    Family Member Consulted husband and daughter             Patient will benefit from skilled therapeutic intervention in order to improve the following deficits and impairments:   Body Structure / Function / Physical Skills: ADL, Strength, Dexterity, Balance, Tone, Body mechanics, Edema,  Proprioception, UE functional use, IADL, ROM, Endurance, Mobility, Coordination, Sensation, FMC, Decreased knowledge of use of DME Cognitive Skills: Attention, Memory, Perception     Visit Diagnosis: Other abnormalities of gait and mobility  Hemiplegia and hemiparesis following cerebral infarction affecting right dominant side (HCC)  Other lack of coordination  Muscle weakness (generalized)  Unsteadiness on feet  Other symptoms and signs involving cognitive functions following cerebral infarction  Visuospatial deficit    Problem List Patient Active Problem List   Diagnosis Date Noted   Nausea without vomiting    Spastic hemiparesis (HCC)    History of hypertension    Neurogenic orthostatic hypotension (HCC)    Transaminitis    Right hemiplegia ([place])    Hemorrhagic stroke (HCC)    Leucocytosis    Essential hypertension    Dyslipidemia    Right hemiparesis ([place])    ICH (intracerebral hemorrhage) ([place]) 12/01/2020   HYPERLIPIDEMIA 07/06/2009   OBESITY 07/06/2009    Zachery Conch, OT 02/25/2021, 10:34 AM  [facility] [address] [place], [place], [address] Phone: [phone]   Fax:  [phone]  Name: JODA BRAATZ MRN: [id number] Date of Birth: [birth year]

## 2021-02-25 NOTE — Patient Instructions (Signed)
Access Code: [email or web address] URL: [email or web address] Date: 02/25/2021 Prepared by: Janann August  Exercises Supine Bridge - 2 x daily - 5 x weekly - 1-2 sets - 10 reps Seated Hip Adduction Isometrics with Ball - 2 x daily - 5 x weekly - 1-2 sets - 10 reps Bent Knee Fallouts (Mirrored) - 2 x daily - 5 x weekly - 1-2 sets - 10 reps Supine Hip Flexion (Mirrored) - 2 x daily - 5 x weekly - 1-2 sets - 10 reps Sit to Stand with Armchair - 2 x daily - 5 x weekly - 1 sets - 10 reps Mini Squat with Counter Support - 2 x daily - 5 x weekly - 1-2 sets - 10 reps

## 2021-02-25 NOTE — Therapy (Signed)
[facility] [address] [place], [place], [address] Phone: [phone]   Fax:  [phone]  Physical Therapy Treatment  Patient Details  Name: Felicia Chen MRN: [id number] Date of Birth: July 15, 1965 Referring Provider (PT): Dr. Dagoberto Ligas   Encounter Date: 02/25/2021   PT End of Session - 02/25/21 1201     Visit Number 2    Number of Visits 25    Date for PT Re-Evaluation 05/17/21    Authorization Type UHC -40 visit limit per discipline?    PT Start Time 1014    PT Stop Time 1103    PT Time Calculation (min) 49 min    Equipment Utilized During Treatment Gait belt    Activity Tolerance Patient tolerated treatment well    Behavior During Therapy WFL for tasks assessed/performed             Past Medical History:  Diagnosis Date   Anxiety    Gilbert's syndrome     Past Surgical History:  Procedure Laterality Date   ABDOMINAL HYSTERECTOMY N/A 06/11/2012   Procedure: HYSTERECTOMY ABDOMINAL;  Surgeon: Allena Katz, MD;  Location: [facility];  Service: Gynecology;  Laterality: N/A;   CESAREAN SECTION     x 4   CYSTOSCOPY N/A 06/11/2012   Procedure: CYSTOSCOPY;  Surgeon: Allena Katz, MD;  Location: [facility];  Service: Gynecology;  Laterality: N/A;   HERNIA REPAIR     LAPAROSCOPIC ASSISTED VAGINAL HYSTERECTOMY N/A 06/11/2012   Procedure: LAPAROSCOPIC ASSISTED VAGINAL HYSTERECTOMY atttempted;  Surgeon: Allena Katz, MD;  Location: [facility];  Service: Gynecology;  Laterality: N/A;  Attempted Laparoscopic assisted vaginal hysterectomy.   LAPAROSCOPIC LYSIS OF ADHESIONS N/A 06/11/2012   Procedure: LAPAROSCOPIC LYSIS OF ADHESIONS;  Surgeon: Allena Katz, MD;  Location: [facility];  Service: Gynecology;  Laterality: N/A;   SALPINGOOPHORECTOMY Bilateral 06/11/2012   Procedure: SALPINGO OOPHORECTOMY;  Surgeon: Allena Katz, MD;  Location: [facility];  Service: Gynecology;  Laterality: Bilateral;    There  were no vitals filed for this visit.   Subjective Assessment - 02/25/21 1015     Subjective Reports R leg feeling a little bit more stiff in the morning. No falls, has been walking with family at home. Forgot to bring her brace today (ordered from [employer]).    Patient is accompained by: Family member   [name], [name]   Pertinent History anxiety, Gilbert's syndrome, obesity, HLD.    Limitations Walking;House hold activities;Standing    How long can you walk comfortably? 10 minutes    Patient Stated Goals wants to get back to normal.    Currently in Pain? No/denies    Pain Onset More than a month ago                               [facility] Adult PT Treatment/Exercise - 02/25/21 1207       Bed Mobility   Bed Mobility Supine to Sit;Sit to Supine    Supine to Sit Supervision/Verbal cueing    Sit to Supine Supervision/Verbal cueing      Transfers   Transfers Sit to Stand;Stand to Sit;Stand Pivot Transfers    Sit to Stand 4: Min guard;5: Supervision    Sit to Stand Details Visual cues/gestures for sequencing;Verbal cues for sequencing;Verbal cues for technique;Visual cues/gestures for precautions/safety    Sit to Stand Details (indicate cue type and reason)  Pt initially demonstrating how she performs at home with pressing up with RUE having RLE staggered behind L and performing with more narrow BOS. Went over proper technique with pt scooting out to the edge, having equal weight bearing between feet and having RLE in proper position (therapist needing to assist at times or pt able to help with LLE to get it in proper position) - cued for incr forward lean and to press with BUE support from mat to stand, and then sitting back down with control. Pt needs cues to take her time with proper set up and technique. Performed x10 reps, educated family present on proper set up and technique.    Stand to Sit 5: Supervision;4: Min guard    Stand Pivot Transfers 5: Supervision     Stand Pivot Transfer Details (indicate cue type and reason) with RW, from w/c <> mat table              Access Code: [email or web address] URL: [email or web address] Date: 02/25/2021 Prepared by: Janann August  Initiated HEP. See MedBridge for further details.  Exercises Supine Bridge - 2 x daily - 5 x weekly - 1-2 sets - 10 reps - therapist stabilizing pt's R ankle/thigh, cued to have pt's family member help stabilize pt with this at home, verbal/tactile cues at R ASIS for incr RLE activation  Seated Hip Adduction Isometrics with Ball - 2 x daily - 5 x weekly - 1-2 sets - 10 reps - cues to put weight through RLE when performing   Bent Knee Fallouts (Mirrored) - 2 x daily - 5 x weekly - 1-2 sets - 10 reps - with RLE, cues for slowed and controlled   Supine Hip Flexion (Mirrored) - 2 x daily - 5 x weekly - 1-2 sets - 10 reps - with RLE, intermittent assist to keep in proper alignment, cued for slowed/controlled.   Sit to Stand with Armchair - 2 x daily - 5 x weekly - 1 sets - 10 reps (see above for full details)  Mini Squat with Counter Support - 2 x daily - 5 x weekly - 1-2 sets - 10 reps - with RW, pt with better set up after performing a sit to stand with equal weight bearing, cues to shift bottom backwards as if she is pretending to sit down.    Discussed having family supervision with exercises to make sure pt is in proper position when performing. Would rather have pt focus on technique vs. Incr reps throughout the day.  Pt reports that she is walking at home with her RW - a few laps around the house 4 times a day with family supervision/min guard with gait belt. Provided pt with tennis balls to RW at home for easier propulsion.    Also performed Supine ball squeezes x10 reps with therapist helping to maintain RLE in proper position.     PT Education - 02/25/21 1201     Education Details Initial HEP, transfer training.    Person(s) Educated Patient;Child(ren)    sister   Methods Explanation;Demonstration    Comprehension Verbalized understanding;Returned demonstration              PT Short Term Goals - 02/17/21 1201       PT SHORT TERM GOAL #1   Title Pt will be independent with initial HEP in with family supervision in order to build upon functional gains made in therapy. ALL STGS DUE 03/17/21    Time 4    Period Weeks  Status New    Target Date 03/17/21      PT SHORT TERM GOAL #2   Title Pt will undergo further assessment of BERG with LTG written as appropriate.    Time 4    Period Weeks    Status New      PT SHORT TERM GOAL #3   Title Pt will improve gait speed to at least 1.25 ft/sec in order to demo improved household mobility.    Baseline .90 ft/sec with RW    Time 4    Period Weeks    Status New      PT SHORT TERM GOAL #4   Title Pt will ambulate at least 54' with RW and supervision in order to demo improved household mobility.    Time 4    Period Weeks    Status New      PT SHORT TERM GOAL #5   Title Pt will perform 5x sit <> stand with UE support in 21 seconds or less with supervision in order to demo decr fall risk.    Baseline 23.69 seconds with min guard    Time 4    Period Weeks    Status New      Additional Short Term Goals   Additional Short Term Goals Yes      PT SHORT TERM GOAL #6   Title --    Time --    Period --    Status --               PT Long Term Goals - 02/17/21 1205       PT LONG TERM GOAL #1   Title Pt will be independent with final HEP in with family supervision in order to build upon functional gains made in therapy. ALL LTGS DUE 04/14/21    Time 8    Period Weeks    Status New    Target Date 04/14/21      PT LONG TERM GOAL #2   Title BERG goal written to demo decr fall risk.    Time 8    Period Weeks    Status New      PT LONG TERM GOAL #3   Title Pt will ambulate at least 300' outdoors with RW and supervision in order to demo improved community mobility.    Time  8    Period Weeks    Status New      PT LONG TERM GOAL #4   Title Pt will go up and down 12 stairs with single handrail with supervision/min guard with step to pattern in order to safely go up/down the stairs in her house.    Baseline not yet assessed.    Time 8    Period Weeks    Status New      PT LONG TERM GOAL #5   Title Pt will perform 5x sit <> stand with UE support in 16 seconds or less with supervision in order to demo decr fall risk.    Baseline 23.69 seconds with min guard    Time 8    Period Weeks    Status New      Additional Long Term Goals   Additional Long Term Goals Yes      PT LONG TERM GOAL #6   Title Pt will improve gait speed to at least 1.9 ft/sec in order to demo improved household mobility and decr fall risk.    Baseline .90 ft/sec with RW  Time 8    Period Weeks    Status New                   Plan - 02/25/21 1214     Clinical Impression Statement Today's skilled session focused on initiating HEP for RLE strengthening and sit <> stand training. Pt has been performing sit to stands at home and mini squats at RW, but needed education on proper technique and set up for more equal weight bearing for improved balance with wider BOS, and RLE activation. Pt needing assist from family with some supine exercises with keeping RLE in place as it tends to be abducated and pt sometimes does not realize position of her leg. Will continue to progress towards LTGs.    Personal Factors and Comorbidities Comorbidity 3+;Past/Current Experience;Time since onset of injury/illness/exacerbation    Comorbidities anxiety, Gilbert's syndrome, obesity, HLD    Examination-Activity Limitations Bathing;Bend;Caring for Others;Toileting;Stand;Stairs;Squat;Reach Overhead;Locomotion Level;Transfers    Examination-Participation Restrictions Cleaning;Community Activity;Driving;Medication Management    Stability/Clinical Decision Making Evolving/Moderate complexity    Rehab  Potential Good    PT Frequency 2x / week    PT Duration 12 weeks    PT Treatment/Interventions ADLs/Self Care Home Management;Electrical Stimulation;DME Instruction;Gait training;Stair training;Functional mobility training;Therapeutic activities;Aquatic Therapy;Therapeutic exercise;Balance training;Neuromuscular re-education;Manual techniques;Orthotic Fit/Training;Patient/family education;Passive range of motion;Energy conservation;Vestibular;Visual/perceptual remediation/compensation    PT Next Visit Plan how is HEP? sit to stands with equal/RLE weight bearing. RLE NMR and strengthening. Gait training with RW, trial different AFOs. pt asking about end of session about trying the staisr on visit. Trial Bioness.    PT Home Exercise Plan [email or web address]    Consulted and Agree with Plan of Care Patient;Family member/caregiver             Patient will benefit from skilled therapeutic intervention in order to improve the following deficits and impairments:  Abnormal gait, Decreased activity tolerance, Decreased balance, Decreased cognition, Decreased endurance, Decreased coordination, Decreased range of motion, Decreased safety awareness, Decreased strength, Hypomobility, Increased edema, Impaired sensation, Postural dysfunction, Impaired flexibility, Impaired tone, Difficulty walking, Impaired UE functional use, Impaired vision/preception  Visit Diagnosis: Other abnormalities of gait and mobility  Hemiplegia and hemiparesis following cerebral infarction affecting right dominant side (HCC)  Muscle weakness (generalized)  Unsteadiness on feet     Problem List Patient Active Problem List   Diagnosis Date Noted   Nausea without vomiting    Spastic hemiparesis ([place])    History of hypertension    Neurogenic orthostatic hypotension (HCC)    Transaminitis    Right hemiplegia ([place])    Hemorrhagic stroke (HCC)    Leucocytosis    Essential hypertension    Dyslipidemia    Right hemiparesis (HCC)     ICH (intracerebral hemorrhage) ([place]) 12/01/2020   HYPERLIPIDEMIA 07/06/2009   OBESITY 07/06/2009    Arliss Journey, PT, DPT  02/25/2021, 12:18 PM  [facility] [address] [place], [place], [address] Phone: [phone]   Fax:  [phone]  Name: Felicia Chen MRN: [id number] Date of Birth: 03-May-1965

## 2021-02-28 ENCOUNTER — Ambulatory Visit: Payer: 59

## 2021-02-28 ENCOUNTER — Other Ambulatory Visit: Payer: Self-pay

## 2021-02-28 ENCOUNTER — Ambulatory Visit: Payer: 59 | Admitting: Occupational Therapy

## 2021-02-28 DIAGNOSIS — R2681 Unsteadiness on feet: Secondary | ICD-10-CM

## 2021-02-28 DIAGNOSIS — I69351 Hemiplegia and hemiparesis following cerebral infarction affecting right dominant side: Secondary | ICD-10-CM

## 2021-02-28 DIAGNOSIS — M6281 Muscle weakness (generalized): Secondary | ICD-10-CM

## 2021-02-28 DIAGNOSIS — R278 Other lack of coordination: Secondary | ICD-10-CM

## 2021-02-28 DIAGNOSIS — R2689 Other abnormalities of gait and mobility: Secondary | ICD-10-CM

## 2021-02-28 DIAGNOSIS — R41841 Cognitive communication deficit: Secondary | ICD-10-CM

## 2021-02-28 NOTE — Therapy (Signed)
[facility] [address] [place], [place], [address] Phone: [phone]   Fax:  [phone]  Occupational Therapy Treatment  Patient Details  Name: Felicia Chen MRN: [id number] Date of Birth: 1965/09/09 Referring Provider (OT): Dr. Courtney Heys   Encounter Date: 02/28/2021   OT End of Session - 02/28/21 1344     Visit Number 4    Number of Visits 25    Date for OT Re-Evaluation 05/17/21    Authorization Type UHC: VL 40 per discipline, no auth required    OT Start Time 0930    OT Stop Time 1015    OT Time Calculation (min) 45 min    Activity Tolerance Patient tolerated treatment well    Behavior During Therapy [facility] for tasks assessed/performed             Past Medical History:  Diagnosis Date   Anxiety    Gilbert's syndrome     Past Surgical History:  Procedure Laterality Date   ABDOMINAL HYSTERECTOMY N/A 06/11/2012   Procedure: HYSTERECTOMY ABDOMINAL;  Surgeon: Allena Katz, MD;  Location: [facility];  Service: Gynecology;  Laterality: N/A;   CESAREAN SECTION     x 4   CYSTOSCOPY N/A 06/11/2012   Procedure: CYSTOSCOPY;  Surgeon: Allena Katz, MD;  Location: [facility];  Service: Gynecology;  Laterality: N/A;   HERNIA REPAIR     LAPAROSCOPIC ASSISTED VAGINAL HYSTERECTOMY N/A 06/11/2012   Procedure: LAPAROSCOPIC ASSISTED VAGINAL HYSTERECTOMY atttempted;  Surgeon: Allena Katz, MD;  Location: [facility];  Service: Gynecology;  Laterality: N/A;  Attempted Laparoscopic assisted vaginal hysterectomy.   LAPAROSCOPIC LYSIS OF ADHESIONS N/A 06/11/2012   Procedure: LAPAROSCOPIC LYSIS OF ADHESIONS;  Surgeon: Allena Katz, MD;  Location: [facility];  Service: Gynecology;  Laterality: N/A;   SALPINGOOPHORECTOMY Bilateral 06/11/2012   Procedure: SALPINGO OOPHORECTOMY;  Surgeon: Allena Katz, MD;  Location: [facility];  Service: Gynecology;  Laterality: Bilateral;    There were no vitals filed for this  visit.   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 PMH: HLD, anxiety, covid-19, Gilbert's syndrome    Limitations fall risk    Currently in Pain? No/denies    Pain Onset More than a month ago             Reviewed coordination HEP extensively with pt and children. Pt performed each with mod cueing. Continued to reinforce strategies for writing and to prevent shoulder hiking w/ ex's.   Issued shoulder HEP for neuro re-education w/ focus on proper technique to prevent shoulder compensations and pain  Discussed using w/c in home to wheel over and transport items in lap to different locations within house prn (since pt is not yet safe to do with walker d/t balance). Pt/family verbalized understanding of this (to allow some independence w/ getting snack, etc but also for fall prevention)                     OT Education - 02/28/21 1013     Education Details review of coordination HEP, Shoulder HEP    Person(s) Educated Patient;Child(ren)    Methods Explanation;Demonstration;Verbal cues;Handout    Comprehension Verbalized understanding;Returned demonstration;Verbal cues required  OT Short Term Goals - 02/25/21 1004       OT SHORT TERM GOAL #1   Title Independent with HEP for RUE shoulder ROM, hand coordination and hand strength    Time 4    Period Weeks    Status On-going      OT SHORT TERM GOAL #2   Title Pt to perform all BADLS with no more than min assist    Time 4    Period Weeks    Status On-going      OT SHORT TERM GOAL #3   Title Pt to write name and address with 50% or greater legibility    Time 4    Period Weeks    Status New      OT SHORT TERM GOAL #4   Title Pt to prepare simple snack,  sandwich, and microwaveable items consistently using DME prn with distant supervision only    Time 4    Period Weeks    Status New      OT SHORT TERM GOAL #5   Title Pt to improve coordination as evidenced by performing 9 hole peg test Rt hand in under 1 min. and 15 sec    Baseline 1 min. 45 sec    Time 4    Period Weeks    Status New      OT SHORT TERM GOAL #6   Title Pt to grip strength Rt hand to 30 lbs or greater in prep for opening jars    Baseline 22 lbs (Lt = 65 lbs)    Time 4    Period Weeks    Status On-going               OT Long Term Goals - 02/16/21 1034       OT LONG TERM GOAL #1   Title Independent with strengthening HEP for Rt shoulder    Time 12    Period Weeks    Status New      OT LONG TERM GOAL #2   Title Pt to fully attend to Rt side and return to using Rt hand as dominant hand 90% of the time safely for ADLS w/ task modifications prn    Time 12    Period Weeks    Status New      OT LONG TERM GOAL #3   Title Independent with all BADLS    Time 12    Period Weeks    Status New      OT LONG TERM GOAL #4   Title Pt to perform simple cooking tasks with supervision/min cueing only    Time 12    Period Weeks    Status New      OT LONG TERM GOAL #5   Title Improve coordination Rt hand as evidenced by performing 9 hole peg test in 45 sec or less    Baseline 1 min. 45 sec.    Time 12    Period Weeks    Status New      Long Term Additional Goals   Additional Long Term Goals Yes      OT LONG TERM GOAL #6   Title Pt to improve grip strength Rt hand to 40 lbs or greater to open jars/containers    Baseline 22 lbs (Lt = 65 lbs)    Time 12    Period Weeks    Status New      OT LONG TERM GOAL #7   Title  Pt to write name at 90% legibility and 3 sentences at 75% or greater legibility    Time 12    Period Weeks    Status New                   Plan - 02/28/21 1344     Clinical Impression Statement Pt continues to need reminders  and reinforcement to perform coordination HEP consistently and correctly at home. Pt admits to not doing them.    OT Occupational Profile and History Detailed Assessment- Review of Records and additional review of physical, cognitive, psychosocial history related to current functional performance    Occupational performance deficits (Please refer to evaluation for details): ADL's;IADL's;Work;Leisure    Body Structure / Function / Physical Skills ADL;Strength;Dexterity;Balance;Tone;Body mechanics;Edema;Proprioception;UE functional use;IADL;ROM;Endurance;Mobility;Coordination;Sensation;FMC;Decreased knowledge of use of DME    Cognitive Skills Attention;Memory;Perception    Rehab Potential Good    Clinical Decision Making Several treatment options, min-mod task modification necessary    Comorbidities Affecting Occupational Performance: May have comorbidities impacting occupational performance    Modification or Assistance to Complete Evaluation  No modification of tasks or assist necessary to complete eval    OT Frequency 2x / week    OT Duration 12 weeks   PLUS EVAL   OT Treatment/Interventions Self-care/ADL training;DME and/or AE instruction;Moist Heat;Therapeutic activities;Compression bandaging;Splinting;Therapeutic exercise;Ultrasound;Cognitive remediation/compensation;Coping strategies training;Visual/perceptual remediation/compensation;Passive range of motion;Neuromuscular education;Electrical Stimulation;Cryotherapy;Manual Therapy;Patient/family education    Plan continue to reinforce RUE functional use, correct reaching patterns, coordination and writing    Consulted and Agree with Plan of Care Patient;Family member/caregiver    Family Member Consulted husband and daughter             Patient will benefit from skilled therapeutic intervention in order to improve the following deficits and impairments:   Body Structure / Function / Physical Skills: ADL, Strength, Dexterity, Balance, Tone,  Body mechanics, Edema, Proprioception, UE functional use, IADL, ROM, Endurance, Mobility, Coordination, Sensation, FMC, Decreased knowledge of use of DME Cognitive Skills: Attention, Memory, Perception     Visit Diagnosis: Hemiplegia and hemiparesis following cerebral infarction affecting right dominant side (HCC)  Other lack of coordination    Problem List Patient Active Problem List   Diagnosis Date Noted   Nausea without vomiting    Spastic hemiparesis (HCC)    History of hypertension    Neurogenic orthostatic hypotension (HCC)    Transaminitis    Right hemiplegia ([place])    Hemorrhagic stroke ([place])    Leucocytosis    Essential hypertension    Dyslipidemia    Right hemiparesis ([place])    ICH (intracerebral hemorrhage) ([place]) 12/01/2020   HYPERLIPIDEMIA 07/06/2009   OBESITY 07/06/2009    Carey Bullocks, OTR/L 02/28/2021, 1:47 PM  [facility] [address] [place], [place], [address] Phone: [phone]   Fax:  [phone]  Name: Felicia Chen MRN: [id number] Date of Birth: [birth year]

## 2021-02-28 NOTE — Patient Instructions (Signed)
Flexion (Assistive)    Clasp hands together and raise arms above head, keeping elbows as straight as possible. Can be done sitting or lying. Repeat __10__ times. Do __3__ sessions per day.  SHOULDER: Flexion - Supine (Cane)    Laying down: Hold PAPER TOWEL ROLL in both hands. Raise arms up overhead. Do not allow back to arch. Repeat  _10__ reps per set, _2__ sets per day

## 2021-02-28 NOTE — Therapy (Signed)
[facility] [address] [place], [place], [address] Phone: [phone]   Fax:  [phone]  Speech Language Pathology Treatment  Patient Details  Name: Felicia Chen MRN: [id number] Date of Birth: October 25, 1965 Referring Provider (SLP): Frann Rider NP   Encounter Date: 02/28/2021   End of Session - 02/28/21 0830     Visit Number 4    Number of Visits 25    Date for SLP Re-Evaluation 05/11/21    Authorization Type UHC, 40 per discipline per notes    Authorization - Visit Number 4    Authorization - Number of Visits 40    SLP Start Time [phone]    SLP Stop Time  0930    SLP Time Calculation (min) 43 min    Activity Tolerance Patient tolerated treatment well             Past Medical History:  Diagnosis Date   Anxiety    Gilbert's syndrome     Past Surgical History:  Procedure Laterality Date   ABDOMINAL HYSTERECTOMY N/A 06/11/2012   Procedure: HYSTERECTOMY ABDOMINAL;  Surgeon: Allena Katz, MD;  Location: [facility];  Service: Gynecology;  Laterality: N/A;   CESAREAN SECTION     x 4   CYSTOSCOPY N/A 06/11/2012   Procedure: CYSTOSCOPY;  Surgeon: Allena Katz, MD;  Location: [facility];  Service: Gynecology;  Laterality: N/A;   HERNIA REPAIR     LAPAROSCOPIC ASSISTED VAGINAL HYSTERECTOMY N/A 06/11/2012   Procedure: LAPAROSCOPIC ASSISTED VAGINAL HYSTERECTOMY atttempted;  Surgeon: Allena Katz, MD;  Location: [facility];  Service: Gynecology;  Laterality: N/A;  Attempted Laparoscopic assisted vaginal hysterectomy.   LAPAROSCOPIC LYSIS OF ADHESIONS N/A 06/11/2012   Procedure: LAPAROSCOPIC LYSIS OF ADHESIONS;  Surgeon: Allena Katz, MD;  Location: [facility];  Service: Gynecology;  Laterality: N/A;   SALPINGOOPHORECTOMY Bilateral 06/11/2012   Procedure: SALPINGO OOPHORECTOMY;  Surgeon: Allena Katz, MD;  Location: [facility];  Service: Gynecology;  Laterality: Bilateral;    There were no vitals filed  for this visit.   Subjective Assessment - 02/28/21 0848     Subjective "I figured out the process"    Patient is accompained by: Family member    Currently in Pain? No/denies                   ADULT SLP TREATMENT - 02/28/21 0829       General Information   Behavior/Cognition Alert;Cooperative;Pleasant mood      Treatment Provided   Treatment provided Cognitive-Linquistic      Cognitive-Linquistic Treatment   Treatment focused on Cognition;Patient/family/caregiver education    Skilled Treatment Pt verbally recapped PT session, recommended exercises, and take aways with mod I. Improving metacognition exhibited with pt noted to be self-analytical of her performance. Pt is reportedly managing medications independently (3 pills in morning only) with good success. SLP discussed congitive skills needed to resume managing personal finances and business finances, in which pt identified need for good attention and error awareness. Error awareness task initiated with rare min A required to scan. Some difficulty with multi-tasking also indicated. SLP assisted with set up of Constant Therapy targeted tasks.      Assessment / Recommendations / Plan   Plan Continue with current plan of care      Progression Toward Goals   Progression toward goals Progressing toward goals              SLP Education -  02/28/21 1439     Education Details HEP, Constant Therapy    Person(s) Educated Patient;Child(ren)    Methods Explanation;Demonstration;Handout    Comprehension Verbalized understanding;Returned demonstration;Need further instruction              SLP Short Term Goals - 02/28/21 0830       SLP SHORT TERM GOAL #1   Title Pt will be independent in mediation management with no missed meds over 1 week    Time 3    Period Weeks    Status On-going      SLP SHORT TERM GOAL #2   Title Pt will carryover 2 compensations for attention to complete 2 IADL's over 1 week with rare min  A    Time 3    Period Weeks    Status On-going      SLP SHORT TERM GOAL #3   Title Pt will carryover compensations for attention, processing and sequencing to complete light meal prep    Time 3    Period Weeks    Status On-going      SLP SHORT TERM GOAL #4   Title Pt will keep agenda or calendar to manage her daily appointments and to do list with rare min A from family    Time 3    Period Weeks    Status On-going              SLP Long Term Goals - 02/28/21 0830       SLP LONG TERM GOAL #1   Title Pt will carryover 3 compensations for attention and error awareness to pay 3 bills with rare min A from family    Time 11    Period Weeks    Status On-going      SLP LONG TERM GOAL #2   Title Pt will ID and self correct 80% of errors on cognitive linguistic tasks with rare min A    Time 11    Period Weeks    Status On-going      SLP LONG TERM GOAL #3   Title Pt will complete 3 executive function tasks with 90% accuracy and rare min A    Time 11    Period Weeks    Status On-going              Plan - 02/28/21 0830     Clinical Impression Statement Ona Roehrs is referred for outpt ST due to cognitive impairments s/p L hemorrhagic CVA. Today, she is accompanied by her children. 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 Marvia also managed her household with several children at home ages 2-28. At this time, pt is beginning to manage her medications and her spouse is managing finances. Conducted ongoing education and training of functional cognitive strategies and tasks to implement at home to increase independence. HEP provided. I recommend skilled ST to maximize cognition for return to PLOF, for safety and independence.    Speech Therapy Frequency 2x / week    Duration 12 weeks    Treatment/Interventions Environmental controls;Cueing hierarchy;SLP instruction and feedback;Compensatory strategies;Functional  tasks;Cognitive reorganization;Compensatory techniques;Internal/external aids;Multimodal communcation approach;Patient/family education    Potential to Achieve Goals Good             Patient will benefit from skilled therapeutic intervention in order to improve the following deficits and impairments:   Cognitive communication deficit    Problem List Patient Active Problem List   Diagnosis  Date Noted   Nausea without vomiting    Spastic hemiparesis (HCC)    History of hypertension    Neurogenic orthostatic hypotension (HCC)    Transaminitis    Right hemiplegia (HCC)    Hemorrhagic stroke (HCC)    Leucocytosis    Essential hypertension    Dyslipidemia    Right hemiparesis (HCC)    ICH (intracerebral hemorrhage) ([place]) 12/01/2020   HYPERLIPIDEMIA 07/06/2009   OBESITY 07/06/2009    Alinda Deem, CCC-SLP 02/28/2021, 2:41 PM  [facility] [address] [place], [place], [address] Phone: [phone]   Fax:  [phone]   Name: JARELIS EHLERT MRN: [id number] Date of Birth: Apr 03, 1965

## 2021-02-28 NOTE — Therapy (Signed)
[facility] [address] [place], [place], [address] Phone: [phone]   Fax:  [phone]  Physical Therapy Treatment  Patient Details  Name: Felicia Chen MRN: [id number] Date of Birth: Dec 01, 1965 Referring Provider (PT): Dr. Dagoberto Ligas   Encounter Date: 02/28/2021   PT End of Session - 02/28/21 0809     Visit Number 3    Number of Visits 25    Date for PT Re-Evaluation 05/17/21    Authorization Type UHC -40 visit limit per discipline?    PT Start Time 0809   pt arrived late   PT Stop Time 0846    PT Time Calculation (min) 37 min    Equipment Utilized During Treatment Gait belt    Activity Tolerance Patient tolerated treatment well    Behavior During Therapy WFL for tasks assessed/performed             Past Medical History:  Diagnosis Date   Anxiety    Gilbert's syndrome     Past Surgical History:  Procedure Laterality Date   ABDOMINAL HYSTERECTOMY N/A 06/11/2012   Procedure: HYSTERECTOMY ABDOMINAL;  Surgeon: Allena Katz, MD;  Location: [facility];  Service: Gynecology;  Laterality: N/A;   CESAREAN SECTION     x 4   CYSTOSCOPY N/A 06/11/2012   Procedure: CYSTOSCOPY;  Surgeon: Allena Katz, MD;  Location: [facility];  Service: Gynecology;  Laterality: N/A;   HERNIA REPAIR     LAPAROSCOPIC ASSISTED VAGINAL HYSTERECTOMY N/A 06/11/2012   Procedure: LAPAROSCOPIC ASSISTED VAGINAL HYSTERECTOMY atttempted;  Surgeon: Allena Katz, MD;  Location: [facility];  Service: Gynecology;  Laterality: N/A;  Attempted Laparoscopic assisted vaginal hysterectomy.   LAPAROSCOPIC LYSIS OF ADHESIONS N/A 06/11/2012   Procedure: LAPAROSCOPIC LYSIS OF ADHESIONS;  Surgeon: Allena Katz, MD;  Location: [facility];  Service: Gynecology;  Laterality: N/A;   SALPINGOOPHORECTOMY Bilateral 06/11/2012   Procedure: SALPINGO OOPHORECTOMY;  Surgeon: Allena Katz, MD;  Location: [facility];  Service: Gynecology;  Laterality:  Bilateral;    There were no vitals filed for this visit.   Subjective Assessment - 02/28/21 0811     Subjective Patient reports that the exercises went well at home. Reports the sit <> stands are going well. No falls to report.    Patient is accompained by: Family member   [name], sister [name]   Pertinent History anxiety, Gilbert's syndrome, obesity, HLD.    Limitations Walking;House hold activities;Standing    How long can you walk comfortably? 10 minutes    Patient Stated Goals wants to get back to normal.    Currently in Pain? No/denies   reports some general stiffness generalized   Pain Onset More than a month ago              [facility] Adult PT Treatment/Exercise - 02/28/21 0001       Transfers   Transfers Sit to Stand;Stand to Sit;Stand Pivot Transfers    Sit to Stand 4: Min guard    Sit to Stand Details Verbal cues for sequencing;Verbal cues for technique;Tactile cues for weight shifting    Sit to Stand Details (indicate cue type and reason) continues to position RLE slightly posterior and ER when standing, PT providing cues for equal weight bearing on BLE. improvements noted in forward lean today, CGA w/ sit <> stand    Stand to Sit 5: Supervision    Stand Pivot Transfers 4: Min guard    Stand  Pivot Transfer Details (indicate cue type and reason) with RW from w/c <> mat, cues for full turn prior to descent to promote safety    Comments completed sit <> stand from w/c and/or mat x 5 reps throughout      Ambulation/Gait   Ambulation/Gait Yes    Ambulation/Gait Assistance 4: Min guard;4: Min assist    Ambulation/Gait Assistance Details Continued gait training with RW, without foot up brace pt bumping RLE into RW at times due to incr external rotation to help clear leg during swing phase. PT providing assitance to promote improvd neutral position and cues for step length/weight shift.PT also providing tactile cues at knee intermittent, patietn demo recurvatum at R knee  with gait.    Ambulation Distance (Feet) 115 Feet   x 2   Assistive device Rolling walker    Gait Pattern Step-through pattern;Decreased stance time - right;Decreased step length - left;Decreased hip/knee flexion - right;Decreased dorsiflexion - right;Decreased weight shift to right;Poor foot clearance - right;Right foot flat;Wide base of support;Abducted- right    Ambulation Surface Level;Indoor    Gait Comments Attempted to trail AFO, but unable to get into patient's shoe. Educated to bring lace up shoes to next session to further assess potential need for AFO. Pt brought in foot up brace that she purchased, donned and use this during session. PT educating on frequency of walking within the home, and educating pt to focus on neutral alignment with ambulation      Self-Care   Self-Care Other Self-Care Comments    Other Self-Care Comments  Due to swelling in RLE, PT educating on proper ways to elevate and frequency to promote reduction in swelling.      Neuro Re-ed    Neuro Re-ed Details  Standing at RW: completed step forward with LLE to target to promote improved weight shift to RLE. PT providing faciliation at pelvis for improved weight shift, with student PT assisting with RLE to promote knee stability and avoid recurvatum w/ tactile cues, completed  x 10 reps with BUE support from RW. Then trialed x 5 reps without UE support. Increased challenge with patient requiring intermitent Min A for balance.                     PT Education - 02/28/21 0848     Education Details continue HEP; bring lace up shoes to next session to session to trial AFOs    Person(s) Educated Patient    Methods Explanation    Comprehension Verbalized understanding              PT Short Term Goals - 02/17/21 1201       PT SHORT TERM GOAL #1   Title Pt will be independent with initial HEP in with family supervision in order to build upon functional gains made in therapy. ALL STGS DUE 03/17/21     Time 4    Period Weeks    Status New    Target Date 03/17/21      PT SHORT TERM GOAL #2   Title Pt will undergo further assessment of BERG with LTG written as appropriate.    Time 4    Period Weeks    Status New      PT SHORT TERM GOAL #3   Title Pt will improve gait speed to at least 1.25 ft/sec in order to demo improved household mobility.    Baseline .90 ft/sec with RW    Time 4  Period Weeks    Status New      PT SHORT TERM GOAL #4   Title Pt will ambulate at least 49' with RW and supervision in order to demo improved household mobility.    Time 4    Period Weeks    Status New      PT SHORT TERM GOAL #5   Title Pt will perform 5x sit <> stand with UE support in 21 seconds or less with supervision in order to demo decr fall risk.    Baseline 23.69 seconds with min guard    Time 4    Period Weeks    Status New      Additional Short Term Goals   Additional Short Term Goals Yes      PT SHORT TERM GOAL #6   Title --    Time --    Period --    Status --               PT Long Term Goals - 02/17/21 1205       PT LONG TERM GOAL #1   Title Pt will be independent with final HEP in with family supervision in order to build upon functional gains made in therapy. ALL LTGS DUE 04/14/21    Time 8    Period Weeks    Status New    Target Date 04/14/21      PT LONG TERM GOAL #2   Title BERG goal written to demo decr fall risk.    Time 8    Period Weeks    Status New      PT LONG TERM GOAL #3   Title Pt will ambulate at least 300' outdoors with RW and supervision in order to demo improved community mobility.    Time 8    Period Weeks    Status New      PT LONG TERM GOAL #4   Title Pt will go up and down 12 stairs with single handrail with supervision/min guard with step to pattern in order to safely go up/down the stairs in her house.    Baseline not yet assessed.    Time 8    Period Weeks    Status New      PT LONG TERM GOAL #5   Title Pt will perform  5x sit <> stand with UE support in 16 seconds or less with supervision in order to demo decr fall risk.    Baseline 23.69 seconds with min guard    Time 8    Period Weeks    Status New      Additional Long Term Goals   Additional Long Term Goals Yes      PT LONG TERM GOAL #6   Title Pt will improve gait speed to at least 1.9 ft/sec in order to demo improved household mobility and decr fall risk.    Baseline .90 ft/sec with RW    Time 8    Period Weeks    Status New                   Plan - 02/28/21 6433     Clinical Impression Statement Today's skilled PT session focused on continued sit <> stand training focused on equal weight shift and proper technique. Continued gait training with use of RW, with patient personal foot up brace. Trialed AFO but unable to get into shoe, therefore eduated to bring lace up tennis shoes to next session.  PT continue to provide cues for step length, and facilitation for improved alignment with RLE as patient has tendency to ER. Will continue to progress toward all LTGs.    Personal Factors and Comorbidities Comorbidity 3+;Past/Current Experience;Time since onset of injury/illness/exacerbation    Comorbidities anxiety, Gilbert's syndrome, obesity, HLD    Examination-Activity Limitations Bathing;Bend;Caring for Others;Toileting;Stand;Stairs;Squat;Reach Overhead;Locomotion Level;Transfers    Examination-Participation Restrictions Cleaning;Community Activity;Driving;Medication Management    Stability/Clinical Decision Making Evolving/Moderate complexity    Rehab Potential Good    PT Frequency 2x / week    PT Duration 12 weeks    PT Treatment/Interventions ADLs/Self Care Home Management;Electrical Stimulation;DME Instruction;Gait training;Stair training;Functional mobility training;Therapeutic activities;Aquatic Therapy;Therapeutic exercise;Balance training;Neuromuscular re-education;Manual techniques;Orthotic Fit/Training;Patient/family  education;Passive range of motion;Energy conservation;Vestibular;Visual/perceptual remediation/compensation    PT Next Visit Plan continue sit to stands with equal/RLE weight bearing. RLE NMR and strengthening. Gait training with RW, trial different AFOs. was unable to trial last time due to unable to get into shoe. trial stairs with AFO as well.  Trial Bioness.    PT Home Exercise Plan [id number]    Consulted and Agree with Plan of Care Patient;Family member/caregiver             Patient will benefit from skilled therapeutic intervention in order to improve the following deficits and impairments:  Abnormal gait, Decreased activity tolerance, Decreased balance, Decreased cognition, Decreased endurance, Decreased coordination, Decreased range of motion, Decreased safety awareness, Decreased strength, Hypomobility, Increased edema, Impaired sensation, Postural dysfunction, Impaired flexibility, Impaired tone, Difficulty walking, Impaired UE functional use, Impaired vision/preception  Visit Diagnosis: Other abnormalities of gait and mobility  Hemiplegia and hemiparesis following cerebral infarction affecting right dominant side (HCC)  Muscle weakness (generalized)  Unsteadiness on feet     Problem List Patient Active Problem List   Diagnosis Date Noted   Nausea without vomiting    Spastic hemiparesis ([facility])    History of hypertension    Neurogenic orthostatic hypotension (HCC)    Transaminitis    Right hemiplegia ([facility])    Hemorrhagic stroke (HCC)    Leucocytosis    Essential hypertension    Dyslipidemia    Right hemiparesis (HCC)    ICH (intracerebral hemorrhage) ([place]) 12/01/2020   HYPERLIPIDEMIA 07/06/2009   OBESITY 07/06/2009    Jones Bales, PT, DPT 02/28/2021, 9:55 AM  [facility] [address] [place], [place], [address] Phone: [phone]   Fax:  [phone]  Name: Felicia Chen MRN:  [id number] Date of Birth: [birth year]

## 2021-03-02 ENCOUNTER — Ambulatory Visit: Payer: 59 | Admitting: Physical Therapy

## 2021-03-02 ENCOUNTER — Encounter: Payer: 59 | Admitting: Occupational Therapy

## 2021-03-04 ENCOUNTER — Encounter: Payer: Self-pay | Admitting: Occupational Therapy

## 2021-03-04 ENCOUNTER — Ambulatory Visit: Payer: 59 | Admitting: Physical Therapy

## 2021-03-04 ENCOUNTER — Telehealth: Payer: Self-pay | Admitting: Physical Therapy

## 2021-03-04 ENCOUNTER — Ambulatory Visit: Payer: 59 | Admitting: Occupational Therapy

## 2021-03-04 ENCOUNTER — Other Ambulatory Visit: Payer: Self-pay

## 2021-03-04 DIAGNOSIS — R2681 Unsteadiness on feet: Secondary | ICD-10-CM

## 2021-03-04 DIAGNOSIS — R278 Other lack of coordination: Secondary | ICD-10-CM

## 2021-03-04 DIAGNOSIS — R41842 Visuospatial deficit: Secondary | ICD-10-CM

## 2021-03-04 DIAGNOSIS — I69351 Hemiplegia and hemiparesis following cerebral infarction affecting right dominant side: Secondary | ICD-10-CM

## 2021-03-04 DIAGNOSIS — M6281 Muscle weakness (generalized): Secondary | ICD-10-CM

## 2021-03-04 DIAGNOSIS — R2689 Other abnormalities of gait and mobility: Secondary | ICD-10-CM

## 2021-03-04 DIAGNOSIS — I69318 Other symptoms and signs involving cognitive functions following cerebral infarction: Secondary | ICD-10-CM

## 2021-03-04 NOTE — Therapy (Signed)
[facility] [address] [place], [place], [address] Phone: [phone]   Fax:  [phone]  Occupational Therapy Treatment  Patient Details  Name: Felicia Chen MRN: [id number] Date of Birth: [birth year] Referring Provider (OT): Dr. Courtney Heys   Encounter Date: 03/04/2021   OT End of Session - 03/04/21 0939     Visit Number 5    Number of Visits 25    Date for OT Re-Evaluation 05/17/21    Authorization Type UHC: VL 40 per discipline, no auth required    OT Start Time [phone]   coming from PT   OT Stop Time 1015    OT Time Calculation (min) 38 min    Activity Tolerance Patient tolerated treatment well    Behavior During Therapy [facility] for tasks assessed/performed             Past Medical History:  Diagnosis Date   Anxiety    Gilbert's syndrome     Past Surgical History:  Procedure Laterality Date   ABDOMINAL HYSTERECTOMY N/A 06/11/2012   Procedure: HYSTERECTOMY ABDOMINAL;  Surgeon: Allena Katz, MD;  Location: [facility];  Service: Gynecology;  Laterality: N/A;   CESAREAN SECTION     x 4   CYSTOSCOPY N/A 06/11/2012   Procedure: CYSTOSCOPY;  Surgeon: Allena Katz, MD;  Location: [facility];  Service: Gynecology;  Laterality: N/A;   HERNIA REPAIR     LAPAROSCOPIC ASSISTED VAGINAL HYSTERECTOMY N/A 06/11/2012   Procedure: LAPAROSCOPIC ASSISTED VAGINAL HYSTERECTOMY atttempted;  Surgeon: Allena Katz, MD;  Location: [facility];  Service: Gynecology;  Laterality: N/A;  Attempted Laparoscopic assisted vaginal hysterectomy.   LAPAROSCOPIC LYSIS OF ADHESIONS N/A 06/11/2012   Procedure: LAPAROSCOPIC LYSIS OF ADHESIONS;  Surgeon: Allena Katz, MD;  Location: [facility];  Service: Gynecology;  Laterality: N/A;   SALPINGOOPHORECTOMY Bilateral 06/11/2012   Procedure: SALPINGO OOPHORECTOMY;  Surgeon: Allena Katz, MD;  Location: [facility];  Service: Gynecology;  Laterality: Bilateral;    There were no vitals  filed for this visit.   Subjective Assessment - 03/04/21 0939     Subjective  "We were thinking about bringing the wheelchair in so I can use it some in the house" "I'm trying to do more for myself - we made cookies and rolling the dough, i put my makeup on"    Patient is accompanied by: Family member   daughter and son   Pertinent History ICH 12/01/20 Lt frontal/parietal lobes w/ SAH extending over bilat cerebral hemispheres and cerebellum, Lt temporal lobe ischemic CVA w/ residual Rt hemiplegia. PMH: HLD, anxiety, covid-19, Gilbert's syndrome    Limitations fall risk    Currently in Pain? No/denies    Pain Score 0-No pain             Pt reports purchasing some puzzles to do at home. Pt also reports doing a lot of handwriting at home yesterday with speech homework.  Medium Pegs with RUE for coordination and copying pattern for visual perceptual skills. Verbal cues for not using LUE for rotating pegs. Verbal and visual cues for keeping RUE shoulder down with low level reaching pattern. Removed with in hand manipulation with mod difficulty. Followed pattern with 100% accuracy.      OT Treatments/Exercises (OP) - 03/04/21 1001       ADLs   UB Dressing pt reports doing most of UB dressing herself with exception of tighter clothing    LB Dressing see  above.    Bathing difficult getting up to second floor for showers but uses shower chair. Pt reports spouse is giving and setting up shampoo for washing hair    ADL Comments opening various containers and practicing pouring water/shampoo from bottle into cup.                      OT Short Term Goals - 02/25/21 1004       OT SHORT TERM GOAL #1   Title Independent with HEP for RUE shoulder ROM, hand coordination and hand strength    Time 4    Period Weeks    Status On-going      OT SHORT TERM GOAL #2   Title Pt to perform all BADLS with no more than min assist    Time 4    Period Weeks    Status On-going      OT  SHORT TERM GOAL #3   Title Pt to write name and address with 50% or greater legibility    Time 4    Period Weeks    Status New      OT SHORT TERM GOAL #4   Title Pt to prepare simple snack, sandwich, and microwaveable items consistently using DME prn with distant supervision only    Time 4    Period Weeks    Status New      OT SHORT TERM GOAL #5   Title Pt to improve coordination as evidenced by performing 9 hole peg test Rt hand in under 1 min. and 15 sec    Baseline 1 min. 45 sec    Time 4    Period Weeks    Status New      OT SHORT TERM GOAL #6   Title Pt to grip strength Rt hand to 30 lbs or greater in prep for opening jars    Baseline 22 lbs (Lt = 65 lbs)    Time 4    Period Weeks    Status On-going               OT Long Term Goals - 02/16/21 1034       OT LONG TERM GOAL #1   Title Independent with strengthening HEP for Rt shoulder    Time 12    Period Weeks    Status New      OT LONG TERM GOAL #2   Title Pt to fully attend to Rt side and return to using Rt hand as dominant hand 90% of the time safely for ADLS w/ task modifications prn    Time 12    Period Weeks    Status New      OT LONG TERM GOAL #3   Title Independent with all BADLS    Time 12    Period Weeks    Status New      OT LONG TERM GOAL #4   Title Pt to perform simple cooking tasks with supervision/min cueing only    Time 12    Period Weeks    Status New      OT LONG TERM GOAL #5   Title Improve coordination Rt hand as evidenced by performing 9 hole peg test in 45 sec or less    Baseline 1 min. 45 sec.    Time 12    Period Weeks    Status New      Long Term Additional Goals   Additional Long Term Goals Yes  OT LONG TERM GOAL #6   Title Pt to improve grip strength Rt hand to 40 lbs or greater to open jars/containers    Baseline 22 lbs (Lt = 65 lbs)    Time 12    Period Weeks    Status New      OT LONG TERM GOAL #7   Title Pt to write name at 90% legibility and 3  sentences at 75% or greater legibility    Time 12    Period Weeks    Status New                   Plan - 03/04/21 1028     Clinical Impression Statement Pt continues with shoulder hiking and other compensatory movements and patterns with reaching with RUE but demonstartes improved coordination and increased independence with ADLs.    OT Occupational Profile and History Detailed Assessment- Review of Records and additional review of physical, cognitive, psychosocial history related to current functional performance    Occupational performance deficits (Please refer to evaluation for details): ADL's;IADL's;Work;Leisure    Body Structure / Function / Physical Skills ADL;Strength;Dexterity;Balance;Tone;Body mechanics;Edema;Proprioception;UE functional use;IADL;ROM;Endurance;Mobility;Coordination;Sensation;FMC;Decreased knowledge of use of DME    Cognitive Skills Attention;Memory;Perception    Rehab Potential Good    Clinical Decision Making Several treatment options, min-mod task modification necessary    Comorbidities Affecting Occupational Performance: May have comorbidities impacting occupational performance    Modification or Assistance to Complete Evaluation  No modification of tasks or assist necessary to complete eval    OT Frequency 2x / week    OT Duration 12 weeks   PLUS EVAL   OT Treatment/Interventions Self-care/ADL training;DME and/or AE instruction;Moist Heat;Therapeutic activities;Compression bandaging;Splinting;Therapeutic exercise;Ultrasound;Cognitive remediation/compensation;Coping strategies training;Visual/perceptual remediation/compensation;Passive range of motion;Neuromuscular education;Electrical Stimulation;Cryotherapy;Manual Therapy;Patient/family education    Plan continue to reinforce RUE functional use, correct reaching patterns, coordination and writing    Consulted and Agree with Plan of Care Patient;Family member/caregiver    Family Member Consulted husband  and daughter             Patient will benefit from skilled therapeutic intervention in order to improve the following deficits and impairments:   Body Structure / Function / Physical Skills: ADL, Strength, Dexterity, Balance, Tone, Body mechanics, Edema, Proprioception, UE functional use, IADL, ROM, Endurance, Mobility, Coordination, Sensation, FMC, Decreased knowledge of use of DME Cognitive Skills: Attention, Memory, Perception     Visit Diagnosis: Hemiplegia and hemiparesis following cerebral infarction affecting right dominant side (HCC)  Visuospatial deficit  Other abnormalities of gait and mobility  Muscle weakness (generalized)  Other lack of coordination  Unsteadiness on feet  Other symptoms and signs involving cognitive functions following cerebral infarction    Problem List Patient Active Problem List   Diagnosis Date Noted   Nausea without vomiting    Spastic hemiparesis (HCC)    History of hypertension    Neurogenic orthostatic hypotension (HCC)    Transaminitis    Right hemiplegia ([place])    Hemorrhagic stroke (HCC)    Leucocytosis    Essential hypertension    Dyslipidemia    Right hemiparesis ([place])    ICH (intracerebral hemorrhage) ([place] [facility]) 12/01/2020   HYPERLIPIDEMIA 07/06/2009   OBESITY 07/06/2009    Zachery Conch, OT 03/04/2021, 10:29 AM  [facility] [address] [place], [place], [address] Phone: [phone]   Fax:  [phone]  Name: Felicia Chen MRN: [id number] Date of Birth: 07/27/1965

## 2021-03-04 NOTE — Therapy (Signed)
[facility] [address] [place], [place], [address] Phone: [phone]   Fax:  [phone]  Physical Therapy Treatment  Patient Details  Name: Felicia Chen MRN: [id number] Date of Birth: 12/07/1965 Referring Provider (PT): Dr. Dagoberto Ligas   Encounter Date: 03/04/2021   PT End of Session - 03/04/21 0855     Visit Number 4    Number of Visits 25    Date for PT Re-Evaluation 05/17/21    Authorization Type UHC -40 visit limit per discipline?    PT Start Time [id number]   PT running late   PT Stop Time 0937    PT Time Calculation (min) 45 min    Equipment Utilized During Treatment Gait belt    Activity Tolerance Patient tolerated treatment well    Behavior During Therapy WFL for tasks assessed/performed             Past Medical History:  Diagnosis Date   Anxiety    Gilbert's syndrome     Past Surgical History:  Procedure Laterality Date   ABDOMINAL HYSTERECTOMY N/A 06/11/2012   Procedure: HYSTERECTOMY ABDOMINAL;  Surgeon: Allena Katz, MD;  Location: [facility];  Service: Gynecology;  Laterality: N/A;   CESAREAN SECTION     x 4   CYSTOSCOPY N/A 06/11/2012   Procedure: CYSTOSCOPY;  Surgeon: Allena Katz, MD;  Location: [facility];  Service: Gynecology;  Laterality: N/A;   HERNIA REPAIR     LAPAROSCOPIC ASSISTED VAGINAL HYSTERECTOMY N/A 06/11/2012   Procedure: LAPAROSCOPIC ASSISTED VAGINAL HYSTERECTOMY atttempted;  Surgeon: Allena Katz, MD;  Location: [facility];  Service: Gynecology;  Laterality: N/A;  Attempted Laparoscopic assisted vaginal hysterectomy.   LAPAROSCOPIC LYSIS OF ADHESIONS N/A 06/11/2012   Procedure: LAPAROSCOPIC LYSIS OF ADHESIONS;  Surgeon: Allena Katz, MD;  Location: [facility];  Service: Gynecology;  Laterality: N/A;   SALPINGOOPHORECTOMY Bilateral 06/11/2012   Procedure: SALPINGO OOPHORECTOMY;  Surgeon: Allena Katz, MD;  Location: [facility];  Service: Gynecology;  Laterality:  Bilateral;    There were no vitals filed for this visit.   Subjective Assessment - 03/04/21 0856     Subjective No changes, working on the exercises at home and they are going well. Working on going slower with her sit <> stands and they are going  much better.    Patient is accompained by: Family member   daughther [name], sister [name]   Pertinent History anxiety, Gilbert's syndrome, obesity, HLD.    Limitations Walking;House hold activities;Standing    How long can you walk comfortably? 10 minutes    Patient Stated Goals wants to get back to normal.    Currently in Pain? No/denies    Pain Onset More than a month ago                               [facility] Adult PT Treatment/Exercise - 03/04/21 1403       Transfers   Transfers Sit to Stand;Stand to Sit;Stand Pivot Transfers    Sit to Stand 4: Min guard    Sit to Stand Details Verbal cues for sequencing;Verbal cues for technique;Tactile cues for weight shifting    Sit to Stand Details (indicate cue type and reason) cues for proper foot positioning for equal weight bearing. Cues to scoot out towards edge and for incr forward lean to stand for incr weight bearing to help decr RLE going into  external rotation    Stand to Sit 5: Supervision    Stand Pivot Transfers 4: Min guard    Stand Pivot Transfer Details (indicate cue type and reason) from w/c <> mat table, cues to fully turn with RW before sitting down.      Ambulation/Gait   Ambulation/Gait Yes    Ambulation/Gait Assistance 4: Min guard;4: Min assist    Ambulation/Gait Assistance Details Performed gait with R PLS Ottobock Walk On AFO for improved foot clearance. Verbal/demo cues to incr RLE weight shifting during stance (and purpose) for incr step length with LLE. PT also providing manual faciliation at pt's pelvis to improve protraction in order to decr compensatory external rotation to clear foot. Pt able to demo improved neutral foot position with manual cues at  pelvis. Demonstrated and had pt's daughter perform this cueing to help pt with this at home when ambulating with RW. Pt's daughter able to perform correctly. Pt also demonstrated improved knee control on RLE with AFO. Discussed will send an order request to pt's MD for an AFO to get the process started.    Ambulation Distance (Feet) 230 Feet   x1, 115' x 1   Assistive device Rolling walker   R AFO   Gait Pattern Step-through pattern;Decreased stance time - right;Decreased step length - left;Decreased hip/knee flexion - right;Decreased dorsiflexion - right;Decreased weight shift to right;Poor foot clearance - right;Right foot flat;Wide base of support;Abducted- right    Ambulation Surface Level;Indoor    Stairs Yes    Stairs Assistance 4: Min guard;4: Min assist    Stairs Assistance Details (indicate cue type and reason) Performed with bilat handrails with cues for proper sequencing, needing intermittent assist to help clear RLE onto step when ascending. When descending, cued to move R hand down railing. Pt able to verbally recall proper sequencing. Discussed will need more practice in therapy before performing at home as pt does not have both handrails.    Stair Management Technique Two rails;Step to pattern;Forwards      Neuro Re-ed    Neuro Re-ed Details  Pt supine on mat table with legs extended: with RLE therapist performed passive stretching into hip IR with 5 second holds x10 reps - pt reporting feeling good with this strech. Then performed active motion of R IR with verbal/tactile/demo cues for technique, pt with difficulty with motor planning but was able to perform after AAROM from therapist. Only able to get foot to midline position. In sitting (pt wearing a sock), performed passive R ankle inversion 2 x 10 reps with a couple second holds for a stretch. Pt unable to perform actively on her own without compensatory movement patterns. Pt asking if she can perform at home, discussed having pt's  daughter's help with movement and they can help her stretch into hip IR in supine and ankle inversion in sitting. Pt's daughter present verbalized understanding.                       PT Short Term Goals - 02/17/21 1201       PT SHORT TERM GOAL #1   Title Pt will be independent with initial HEP in with family supervision in order to build upon functional gains made in therapy. ALL STGS DUE 03/17/21    Time 4    Period Weeks    Status New    Target Date 03/17/21      PT SHORT TERM GOAL #2   Title Pt will undergo  further assessment of BERG with LTG written as appropriate.    Time 4    Period Weeks    Status New      PT SHORT TERM GOAL #3   Title Pt will improve gait speed to at least 1.25 ft/sec in order to demo improved household mobility.    Baseline .90 ft/sec with RW    Time 4    Period Weeks    Status New      PT SHORT TERM GOAL #4   Title Pt will ambulate at least 78' with RW and supervision in order to demo improved household mobility.    Time 4    Period Weeks    Status New      PT SHORT TERM GOAL #5   Title Pt will perform 5x sit <> stand with UE support in 21 seconds or less with supervision in order to demo decr fall risk.    Baseline 23.69 seconds with min guard    Time 4    Period Weeks    Status New      Additional Short Term Goals   Additional Short Term Goals Yes      PT SHORT TERM GOAL #6   Title --    Time --    Period --    Status --               PT Long Term Goals - 02/17/21 1205       PT LONG TERM GOAL #1   Title Pt will be independent with final HEP in with family supervision in order to build upon functional gains made in therapy. ALL LTGS DUE 04/14/21    Time 8    Period Weeks    Status New    Target Date 04/14/21      PT LONG TERM GOAL #2   Title BERG goal written to demo decr fall risk.    Time 8    Period Weeks    Status New      PT LONG TERM GOAL #3   Title Pt will ambulate at least 300' outdoors with RW  and supervision in order to demo improved community mobility.    Time 8    Period Weeks    Status New      PT LONG TERM GOAL #4   Title Pt will go up and down 12 stairs with single handrail with supervision/min guard with step to pattern in order to safely go up/down the stairs in her house.    Baseline not yet assessed.    Time 8    Period Weeks    Status New      PT LONG TERM GOAL #5   Title Pt will perform 5x sit <> stand with UE support in 16 seconds or less with supervision in order to demo decr fall risk.    Baseline 23.69 seconds with min guard    Time 8    Period Weeks    Status New      Additional Long Term Goals   Additional Long Term Goals Yes      PT LONG TERM GOAL #6   Title Pt will improve gait speed to at least 1.9 ft/sec in order to demo improved household mobility and decr fall risk.    Baseline .90 ft/sec with RW    Time 8    Period Weeks    Status New  Plan - 03/04/21 1412     Clinical Impression Statement Pt wearing lace up tennis shoes today to trial AFOs. Used R Ottobock PLS AFO with pt demonstrating improved foot clearance and improved knee control. Pt did need manual cues from therapist at pt's pelvis for incr protraction during gait for more neutral foot position vs. external rotation. Pt did improve gait quality with these cues and educated pt's daughter on how she can help with this at home during gait. Performed stair training with bilat handrails and step to pattern with min guard/min A. Will need continued practice before doing safely at home as pt does not have handrails on both sides. Will continue to progress towards LTGs.    Personal Factors and Comorbidities Comorbidity 3+;Past/Current Experience;Time since onset of injury/illness/exacerbation    Comorbidities anxiety, Gilbert's syndrome, obesity, HLD    Examination-Activity Limitations Bathing;Bend;Caring for Others;Toileting;Stand;Stairs;Squat;Reach Overhead;Locomotion  Level;Transfers    Examination-Participation Restrictions Cleaning;Community Activity;Driving;Medication Management    Stability/Clinical Decision Making Evolving/Moderate complexity    Rehab Potential Good    PT Frequency 2x / week    PT Duration 12 weeks    PT Treatment/Interventions ADLs/Self Care Home Management;Electrical Stimulation;DME Instruction;Gait training;Stair training;Functional mobility training;Therapeutic activities;Aquatic Therapy;Therapeutic exercise;Balance training;Neuromuscular re-education;Manual techniques;Orthotic Fit/Training;Patient/family education;Passive range of motion;Energy conservation;Vestibular;Visual/perceptual remediation/compensation    PT Next Visit Plan trial Bioness continue sit to stands with equal/RLE weight bearing. RLE NMR and strengthening. Gait training with RW - ottobock PLS AFO worked well, might wanna try Fabio Asa too just to see. Asked Dr. Dagoberto Ligas for AFO order. Continue to practice stairs.    PT Home Exercise Plan [id number]    Consulted and Agree with Plan of Care Patient;Family member/caregiver             Patient will benefit from skilled therapeutic intervention in order to improve the following deficits and impairments:  Abnormal gait, Decreased activity tolerance, Decreased balance, Decreased cognition, Decreased endurance, Decreased coordination, Decreased range of motion, Decreased safety awareness, Decreased strength, Hypomobility, Increased edema, Impaired sensation, Postural dysfunction, Impaired flexibility, Impaired tone, Difficulty walking, Impaired UE functional use, Impaired vision/preception  Visit Diagnosis: Hemiplegia and hemiparesis following cerebral infarction affecting right dominant side (HCC)  Other abnormalities of gait and mobility  Muscle weakness (generalized)     Problem List Patient Active Problem List   Diagnosis Date Noted   Nausea without vomiting    Spastic hemiparesis (HCC)    History of  hypertension    Neurogenic orthostatic hypotension (HCC)    Transaminitis    Right hemiplegia ([facility])    Hemorrhagic stroke (HCC)    Leucocytosis    Essential hypertension    Dyslipidemia    Right hemiparesis (HCC)    ICH (intracerebral hemorrhage) ([facility]) 12/01/2020   HYPERLIPIDEMIA 07/06/2009   OBESITY 07/06/2009    Arliss Journey, PT, DPT  03/04/2021, 2:15 PM  [facility] [address] [place], [place], [address] Phone: [phone]   Fax:  [phone]  Name: Felicia Chen MRN: [id number] Date of Birth: 1965/04/14

## 2021-03-04 NOTE — Telephone Encounter (Signed)
Dr. Dagoberto Ligas,  Your patient Felicia Chen has been working with Physical Therapy at [facility].  The patient would benefit from an order for a R AFO in order to help with balance, improve gait mechanics, and decr pt's risk of falls.  I saw that her next visit with you isn't until February. Would is be possible for you to addend your note from 01/24/21 to include the need for patient to use a R AFO?   Thanks so much, Janann August, PT, DPT 03/04/21 2:02 PM    [facility] [address] [place], [place]  [address] Phone:  [phone] Fax:  [phone]

## 2021-03-07 NOTE — Telephone Encounter (Signed)
Have written an Rx and will mail to pt for [employer] to get a custom R AFO- I have also addended note- I usually cannot after 30 days, esp if don't discuss it in note, but I had already mentioned a foot up brace, so was able to change slightly- thanks- ML

## 2021-03-09 ENCOUNTER — Ambulatory Visit: Payer: 59 | Admitting: Physical Therapy

## 2021-03-09 ENCOUNTER — Encounter: Payer: 59 | Admitting: Occupational Therapy

## 2021-03-09 ENCOUNTER — Encounter: Payer: 59 | Admitting: Speech Pathology

## 2021-03-10 ENCOUNTER — Other Ambulatory Visit: Payer: Self-pay

## 2021-03-10 ENCOUNTER — Ambulatory Visit: Payer: 59

## 2021-03-10 ENCOUNTER — Ambulatory Visit: Payer: 59 | Admitting: Occupational Therapy

## 2021-03-10 DIAGNOSIS — R2689 Other abnormalities of gait and mobility: Secondary | ICD-10-CM

## 2021-03-10 DIAGNOSIS — R2681 Unsteadiness on feet: Secondary | ICD-10-CM

## 2021-03-10 DIAGNOSIS — M6281 Muscle weakness (generalized): Secondary | ICD-10-CM

## 2021-03-10 DIAGNOSIS — R278 Other lack of coordination: Secondary | ICD-10-CM

## 2021-03-10 DIAGNOSIS — I69318 Other symptoms and signs involving cognitive functions following cerebral infarction: Secondary | ICD-10-CM

## 2021-03-10 DIAGNOSIS — I69351 Hemiplegia and hemiparesis following cerebral infarction affecting right dominant side: Secondary | ICD-10-CM

## 2021-03-10 DIAGNOSIS — R41842 Visuospatial deficit: Secondary | ICD-10-CM

## 2021-03-10 NOTE — Therapy (Signed)
[facility] [address] [place], [place], [address] Phone: [phone]   Fax:  [phone]  Occupational Therapy Treatment  Patient Details  Name: Felicia Chen MRN: [id number] Date of Birth: September 04, 1965 Referring Provider (OT): Dr. Courtney Heys   Encounter Date: 03/10/2021   OT End of Session - 03/10/21 1104     Visit Number 6    Number of Visits 25    Date for OT Re-Evaluation 05/17/21    Authorization Type UHC: VL 40 per discipline, no auth required    OT Start Time 1102    OT Stop Time 1145    OT Time Calculation (min) 43 min    Activity Tolerance Patient tolerated treatment well    Behavior During Therapy [facility] for tasks assessed/performed             Past Medical History:  Diagnosis Date   Anxiety    Gilbert's syndrome     Past Surgical History:  Procedure Laterality Date   ABDOMINAL HYSTERECTOMY N/A 06/11/2012   Procedure: HYSTERECTOMY ABDOMINAL;  Surgeon: Allena Katz, MD;  Location: [facility];  Service: Gynecology;  Laterality: N/A;   CESAREAN SECTION     x 4   CYSTOSCOPY N/A 06/11/2012   Procedure: CYSTOSCOPY;  Surgeon: Allena Katz, MD;  Location: [facility];  Service: Gynecology;  Laterality: N/A;   HERNIA REPAIR     LAPAROSCOPIC ASSISTED VAGINAL HYSTERECTOMY N/A 06/11/2012   Procedure: LAPAROSCOPIC ASSISTED VAGINAL HYSTERECTOMY atttempted;  Surgeon: Allena Katz, MD;  Location: [facility];  Service: Gynecology;  Laterality: N/A;  Attempted Laparoscopic assisted vaginal hysterectomy.   LAPAROSCOPIC LYSIS OF ADHESIONS N/A 06/11/2012   Procedure: LAPAROSCOPIC LYSIS OF ADHESIONS;  Surgeon: Allena Katz, MD;  Location: [facility];  Service: Gynecology;  Laterality: N/A;   SALPINGOOPHORECTOMY Bilateral 06/11/2012   Procedure: SALPINGO OOPHORECTOMY;  Surgeon: Allena Katz, MD;  Location: [facility];  Service: Gynecology;  Laterality: Bilateral;    There were no vitals filed for this  visit.   Subjective Assessment - 03/10/21 1102     Subjective  "I am doing alright"    Patient is accompanied by: Family member   daughter and son   Pertinent History ICH 12/01/20 Lt frontal/parietal lobes w/ SAH extending over bilat cerebral hemispheres and cerebellum, Lt temporal lobe ischemic CVA w/ residual Rt hemiplegia. PMH: HLD, anxiety, covid-19, Gilbert's syndrome    Limitations fall risk    Currently in Pain? No/denies    Pain Score 0-No pain             Handwriting trialed different grips with greatest success with tan/foam grip. Wrote first name with 100% legibility but increased time.  Maze with RUE with tan/ foam grip on ballpoint pen with shakiness but overall stayed without lines with minimal deviation.   Tracing letters with highlighter with coban with wide barrel and traction with increased ability to stay within the line.   In Hand Manipulatoin with connect four chips and placing in palm and translating to fingertips with RUE and placing into cup. Little difficulty - upgraded to placing into Connect Four frame for precision.   Table/Foam Roller with BUE on forearms and rolling out x 10 reps  Perfection with use of pincer grasp on pieces and identifying shapes with 1 error and min difficulty.                     OT Short Term  Goals - 03/10/21 1105       OT SHORT TERM GOAL #1   Title Independent with HEP for RUE shoulder ROM, hand coordination and hand strength    Time 4    Period Weeks    Status On-going    Target Date 03/16/21      OT SHORT TERM GOAL #2   Title Pt to perform all BADLS with no more than min assist    Time 4    Period Weeks    Status On-going      OT SHORT TERM GOAL #3   Title Pt to write name and address with 50% or greater legibility    Time 4    Period Weeks    Status New      OT SHORT TERM GOAL #4   Title Pt to prepare simple snack, sandwich, and microwaveable items consistently using DME prn with distant  supervision only    Time 4    Period Weeks    Status New      OT SHORT TERM GOAL #5   Title Pt to improve coordination as evidenced by performing 9 hole peg test Rt hand in under 1 min. and 15 sec    Baseline 1 min. 45 sec    Time 4    Period Weeks    Status New      OT SHORT TERM GOAL #6   Title Pt to grip strength Rt hand to 30 lbs or greater in prep for opening jars    Baseline 22 lbs (Lt = 65 lbs)    Time 4    Period Weeks    Status On-going               OT Long Term Goals - 02/16/21 1034       OT LONG TERM GOAL #1   Title Independent with strengthening HEP for Rt shoulder    Time 12    Period Weeks    Status New      OT LONG TERM GOAL #2   Title Pt to fully attend to Rt side and return to using Rt hand as dominant hand 90% of the time safely for ADLS w/ task modifications prn    Time 12    Period Weeks    Status New      OT LONG TERM GOAL #3   Title Independent with all BADLS    Time 12    Period Weeks    Status New      OT LONG TERM GOAL #4   Title Pt to perform simple cooking tasks with supervision/min cueing only    Time 12    Period Weeks    Status New      OT LONG TERM GOAL #5   Title Improve coordination Rt hand as evidenced by performing 9 hole peg test in 45 sec or less    Baseline 1 min. 45 sec.    Time 12    Period Weeks    Status New      Long Term Additional Goals   Additional Long Term Goals Yes      OT LONG TERM GOAL #6   Title Pt to improve grip strength Rt hand to 40 lbs or greater to open jars/containers    Baseline 22 lbs (Lt = 65 lbs)    Time 12    Period Weeks    Status New      OT LONG TERM GOAL #7  Title Pt to write name at 90% legibility and 3 sentences at 75% or greater legibility    Time 12    Period Weeks    Status New                   Plan - 03/10/21 1143     Clinical Impression Statement Pt continues to req cues for movement pattern with RUE shoulder. Pt with increased coordination noted today  .    OT Occupational Profile and History Detailed Assessment- Review of Records and additional review of physical, cognitive, psychosocial history related to current functional performance    Occupational performance deficits (Please refer to evaluation for details): ADL's;IADL's;Work;Leisure    Body Structure / Function / Physical Skills ADL;Strength;Dexterity;Balance;Tone;Body mechanics;Edema;Proprioception;UE functional use;IADL;ROM;Endurance;Mobility;Coordination;Sensation;FMC;Decreased knowledge of use of DME    Cognitive Skills Attention;Memory;Perception    Rehab Potential Good    Clinical Decision Making Several treatment options, min-mod task modification necessary    Comorbidities Affecting Occupational Performance: May have comorbidities impacting occupational performance    Modification or Assistance to Complete Evaluation  No modification of tasks or assist necessary to complete eval    OT Frequency 2x / week    OT Duration 12 weeks   PLUS EVAL   OT Treatment/Interventions Self-care/ADL training;DME and/or AE instruction;Moist Heat;Therapeutic activities;Compression bandaging;Splinting;Therapeutic exercise;Ultrasound;Cognitive remediation/compensation;Coping strategies training;Visual/perceptual remediation/compensation;Passive range of motion;Neuromuscular education;Electrical Stimulation;Cryotherapy;Manual Therapy;Patient/family education    Plan reaching patterns with RUE, coordination    Consulted and Agree with Plan of Care Patient;Family member/caregiver    Family Member Consulted husband and daughter             Patient will benefit from skilled therapeutic intervention in order to improve the following deficits and impairments:   Body Structure / Function / Physical Skills: ADL, Strength, Dexterity, Balance, Tone, Body mechanics, Edema, Proprioception, UE functional use, IADL, ROM, Endurance, Mobility, Coordination, Sensation, FMC, Decreased knowledge of use of  DME Cognitive Skills: Attention, Memory, Perception     Visit Diagnosis: Hemiplegia and hemiparesis following cerebral infarction affecting right dominant side (HCC)  Visuospatial deficit  Other abnormalities of gait and mobility  Muscle weakness (generalized)  Other lack of coordination  Unsteadiness on feet  Other symptoms and signs involving cognitive functions following cerebral infarction    Problem List Patient Active Problem List   Diagnosis Date Noted   Nausea without vomiting    Spastic hemiparesis (HCC)    History of hypertension    Neurogenic orthostatic hypotension (HCC)    Transaminitis    Right hemiplegia ([place])    Hemorrhagic stroke (HCC)    Leucocytosis    Essential hypertension    Dyslipidemia    Right hemiparesis ([place])    ICH (intracerebral hemorrhage) ([facility]) 12/01/2020   HYPERLIPIDEMIA 07/06/2009   OBESITY 07/06/2009    Zachery Conch, OT 03/10/2021, 11:43 AM  [facility] [address] [place], [place], [address] Phone: [phone]   Fax:  [phone]  Name: KEYANDRA SWENSON MRN: [id number] Date of Birth: 1966/01/12

## 2021-03-10 NOTE — Therapy (Signed)
[facility] [address] [place], [place], [address] Phone: [phone]   Fax:  [phone]  Physical Therapy Treatment  Patient Details  Name: Felicia Chen MRN: [id number] Date of Birth: 1965/08/25 Referring Provider (PT): Dr. Dagoberto Ligas   Encounter Date: 03/10/2021   PT End of Session - 03/10/21 1259     Visit Number 5    Number of Visits 25    Date for PT Re-Evaluation 05/17/21    Authorization Type UHC -40 visit limit per discipline?    PT Start Time 1019    PT Stop Time 1100    PT Time Calculation (min) 41 min    Equipment Utilized During Treatment Gait belt    Activity Tolerance Patient tolerated treatment well    Behavior During Therapy WFL for tasks assessed/performed             Past Medical History:  Diagnosis Date   Anxiety    Gilbert's syndrome     Past Surgical History:  Procedure Laterality Date   ABDOMINAL HYSTERECTOMY N/A 06/11/2012   Procedure: HYSTERECTOMY ABDOMINAL;  Surgeon: Allena Katz, MD;  Location: [facility];  Service: Gynecology;  Laterality: N/A;   CESAREAN SECTION     x 4   CYSTOSCOPY N/A 06/11/2012   Procedure: CYSTOSCOPY;  Surgeon: Allena Katz, MD;  Location: [facility];  Service: Gynecology;  Laterality: N/A;   HERNIA REPAIR     LAPAROSCOPIC ASSISTED VAGINAL HYSTERECTOMY N/A 06/11/2012   Procedure: LAPAROSCOPIC ASSISTED VAGINAL HYSTERECTOMY atttempted;  Surgeon: Allena Katz, MD;  Location: [facility];  Service: Gynecology;  Laterality: N/A;  Attempted Laparoscopic assisted vaginal hysterectomy.   LAPAROSCOPIC LYSIS OF ADHESIONS N/A 06/11/2012   Procedure: LAPAROSCOPIC LYSIS OF ADHESIONS;  Surgeon: Allena Katz, MD;  Location: [facility];  Service: Gynecology;  Laterality: N/A;   SALPINGOOPHORECTOMY Bilateral 06/11/2012   Procedure: SALPINGO OOPHORECTOMY;  Surgeon: Allena Katz, MD;  Location: [facility];  Service: Gynecology;  Laterality: Bilateral;    There  were no vitals filed for this visit.   Subjective Assessment - 03/10/21 1022     Subjective Patient reports been felling well. Reports that have placed her on welbutrin. No pain, just some general stiffness. No falls.    Patient is accompained by: Family member   [name], [name]   Pertinent History anxiety, Gilbert's syndrome, obesity, HLD.    Limitations Walking;House hold activities;Standing    How long can you walk comfortably? 10 minutes    Patient Stated Goals wants to get back to normal.    Currently in Pain? No/denies    Pain Onset More than a month ago               [facility] Adult PT Treatment/Exercise - 03/10/21 0001       Transfers   Transfers Sit to Stand;Stand to Sit    Sit to Stand 4: Min guard    Sit to Stand Details Verbal cues for sequencing;Verbal cues for technique;Tactile cues for weight shifting    Stand to Sit 5: Supervision    Stand to Sit Details cues for control with descent to promote eccentric strength    Comments completed sit <> stand from w/c without UE support x 3 reps. working on standing upright and maintain balance withotu UE support, cues for momentum and forward lean      Ambulation/Gait   Ambulation/Gait Yes    Ambulation/Gait Assistance 4: Min guard;4: Min  assist    Ambulation/Gait Assistance Details Continued gait with R PLS Ottobock Walk On AFO for improved foot clearance. Verbal/demo cues to incr RLE weight shifting during stance (and purpose) for incr step length with LLE. PT also providing manual faciliation at pt's pelvis to improve alignment of RLE due to ER. PT continued education on AFO, order recieved and will begin process of obtaining personal AFO.    Ambulation Distance (Feet) 230 Feet    Assistive device Rolling walker    Gait Pattern Step-through pattern;Decreased stance time - right;Decreased step length - left;Decreased hip/knee flexion - right;Decreased dorsiflexion - right;Decreased weight shift to right;Poor foot  clearance - right;Right foot flat;Wide base of support;Abducted- right    Ambulation Surface Level;Indoor    Stairs Yes    Stairs Assistance 4: Min assist    Stairs Assistance Details (indicate cue type and reason) Performed with bilat handrails and AFO donned,  No cues required for proper sequencing as patient remembered cues from prior PT session. Pt did require intermittent assist to help clear RLE onto step when ascending, as well as cues to ensure RLE heel does not catch on step.    Stair Management Technique Two rails;Step to pattern;Forwards      Neuro Re-ed    Neuro Re-ed Details  Standing at [place] without UE support completed lateral weight shifting x 10 reps to each direction. Then with light UE support, completed steps forward with LLE and working on improved stability/stance on the RLE, completed x 15 reps. Cues for weight shift > then step forward > weight shift > step back to midline, slowly progressed to reduced UE support.                     PT Education - 03/10/21 1300     Education Details Will send information to [employer]) Educated Patient    Methods Explanation    Comprehension Verbalized understanding              PT Short Term Goals - 02/17/21 1201       PT SHORT TERM GOAL #1   Title Pt will be independent with initial HEP in with family supervision in order to build upon functional gains made in therapy. ALL STGS DUE 03/17/21    Time 4    Period Weeks    Status New    Target Date 03/17/21      PT SHORT TERM GOAL #2   Title Pt will undergo further assessment of BERG with LTG written as appropriate.    Time 4    Period Weeks    Status New      PT SHORT TERM GOAL #3   Title Pt will improve gait speed to at least 1.25 ft/sec in order to demo improved household mobility.    Baseline .90 ft/sec with RW    Time 4    Period Weeks    Status New      PT SHORT TERM GOAL #4   Title Pt will ambulate at least 61' with RW and supervision in  order to demo improved household mobility.    Time 4    Period Weeks    Status New      PT SHORT TERM GOAL #5   Title Pt will perform 5x sit <> stand with UE support in 21 seconds or less with supervision in order to demo decr fall risk.    Baseline 23.69 seconds with min guard  Time 4    Period Weeks    Status New      Additional Short Term Goals   Additional Short Term Goals Yes      PT SHORT TERM GOAL #6   Title --    Time --    Period --    Status --               PT Long Term Goals - 02/17/21 1205       PT LONG TERM GOAL #1   Title Pt will be independent with final HEP in with family supervision in order to build upon functional gains made in therapy. ALL LTGS DUE 04/14/21    Time 8    Period Weeks    Status New    Target Date 04/14/21      PT LONG TERM GOAL #2   Title BERG goal written to demo decr fall risk.    Time 8    Period Weeks    Status New      PT LONG TERM GOAL #3   Title Pt will ambulate at least 300' outdoors with RW and supervision in order to demo improved community mobility.    Time 8    Period Weeks    Status New      PT LONG TERM GOAL #4   Title Pt will go up and down 12 stairs with single handrail with supervision/min guard with step to pattern in order to safely go up/down the stairs in her house.    Baseline not yet assessed.    Time 8    Period Weeks    Status New      PT LONG TERM GOAL #5   Title Pt will perform 5x sit <> stand with UE support in 16 seconds or less with supervision in order to demo decr fall risk.    Baseline 23.69 seconds with min guard    Time 8    Period Weeks    Status New      Additional Long Term Goals   Additional Long Term Goals Yes      PT LONG TERM GOAL #6   Title Pt will improve gait speed to at least 1.9 ft/sec in order to demo improved household mobility and decr fall risk.    Baseline .90 ft/sec with RW    Time 8    Period Weeks    Status New                   Plan -  03/10/21 1301     Clinical Impression Statement Continued use of R Ottobok PLS AFO with gait and stair training, continue to require faciliation at patient's pelvis and RLE to reduce external rotation. Recieved order for AFO, PT to fax over information regarding bracing to [facility]. Continued NMR focused on sit <> stands and weight shift. Will continue per POC.    Personal Factors and Comorbidities Comorbidity 3+;Past/Current Experience;Time since onset of injury/illness/exacerbation    Comorbidities anxiety, Gilbert's syndrome, obesity, HLD    Examination-Activity Limitations Bathing;Bend;Caring for Others;Toileting;Stand;Stairs;Squat;Reach Overhead;Locomotion Level;Transfers    Examination-Participation Restrictions Cleaning;Community Activity;Driving;Medication Management    Stability/Clinical Decision Making Evolving/Moderate complexity    Rehab Potential Good    PT Frequency 2x / week    PT Duration 12 weeks    PT Treatment/Interventions ADLs/Self Care Home Management;Electrical Stimulation;DME Instruction;Gait training;Stair training;Functional mobility training;Therapeutic activities;Aquatic Therapy;Therapeutic exercise;Balance training;Neuromuscular re-education;Manual techniques;Orthotic Fit/Training;Patient/family education;Passive range of motion;Energy conservation;Vestibular;Visual/perceptual remediation/compensation  PT Next Visit Plan trial Bioness continue sit to stands with equal/RLE weight bearing. RLE NMR and strengthening. Gait training with RW - ottobock PLS AFO worked well, might wanna try Fabio Asa too just to see. Asked Dr. Dagoberto Ligas for AFO order. Continue to practice stairs.    PT Home Exercise Plan [id number]    Consulted and Agree with Plan of Care Patient;Family member/caregiver             Patient will benefit from skilled therapeutic intervention in order to improve the following deficits and impairments:  Abnormal gait, Decreased activity tolerance, Decreased  balance, Decreased cognition, Decreased endurance, Decreased coordination, Decreased range of motion, Decreased safety awareness, Decreased strength, Hypomobility, Increased edema, Impaired sensation, Postural dysfunction, Impaired flexibility, Impaired tone, Difficulty walking, Impaired UE functional use, Impaired vision/preception  Visit Diagnosis: Hemiplegia and hemiparesis following cerebral infarction affecting right dominant side (HCC)  Unsteadiness on feet  Other abnormalities of gait and mobility  Muscle weakness (generalized)     Problem List Patient Active Problem List   Diagnosis Date Noted   Nausea without vomiting    Spastic hemiparesis (HCC)    History of hypertension    Neurogenic orthostatic hypotension (HCC)    Transaminitis    Right hemiplegia ([place])    Hemorrhagic stroke (HCC)    Leucocytosis    Essential hypertension    Dyslipidemia    Right hemiparesis (HCC)    ICH (intracerebral hemorrhage) ([place]) 12/01/2020   HYPERLIPIDEMIA 07/06/2009   OBESITY 07/06/2009    Jones Bales, PT, DPT 03/10/2021, 1:04 PM  [facility] [address] [place], [place], [address] Phone: [phone]   Fax:  [phone]  Name: Felicia Chen MRN: [id number] Date of Birth: 1966/02/27

## 2021-03-11 ENCOUNTER — Ambulatory Visit
Admission: RE | Admit: 2021-03-11 | Discharge: 2021-03-11 | Disposition: A | Discharge status: Home / Self Care | Payer: 59 | Source: Ambulatory Visit | Attending: Adult Health | Admitting: Adult Health

## 2021-03-11 DIAGNOSIS — I619 Nontraumatic intracerebral hemorrhage, unspecified: Secondary | ICD-10-CM | POA: Diagnosis not present

## 2021-03-11 MED ORDER — GADOBENATE DIMEGLUMINE 529 MG/ML IV SOLN
20.0000 mL | Freq: Once | INTRAVENOUS | Status: AC | PRN
  Administered 2021-03-11: 09:00:00 20 mL via INTRAVENOUS

## 2021-03-16 ENCOUNTER — Encounter: Payer: 59 | Admitting: Occupational Therapy

## 2021-03-16 ENCOUNTER — Telehealth: Payer: Self-pay

## 2021-03-16 ENCOUNTER — Ambulatory Visit: Payer: 59 | Admitting: Physical Therapy

## 2021-03-16 NOTE — Telephone Encounter (Signed)
Patient's husband called and stated the patient misplaced the rx for the R foot AFO to [facility]. He wants to know if a new one can be sent to them. Please advise.

## 2021-03-18 ENCOUNTER — Ambulatory Visit: Payer: 59

## 2021-03-18 ENCOUNTER — Ambulatory Visit: Payer: 59 | Admitting: Occupational Therapy

## 2021-03-18 ENCOUNTER — Encounter: Payer: Self-pay | Admitting: Occupational Therapy

## 2021-03-18 ENCOUNTER — Ambulatory Visit: Payer: 59 | Admitting: Physical Therapy

## 2021-03-18 ENCOUNTER — Encounter: Payer: Self-pay | Admitting: Physical Therapy

## 2021-03-18 ENCOUNTER — Other Ambulatory Visit: Payer: Self-pay

## 2021-03-18 VITALS — BP 117/88 | HR 106

## 2021-03-18 DIAGNOSIS — R2681 Unsteadiness on feet: Secondary | ICD-10-CM

## 2021-03-18 DIAGNOSIS — R2689 Other abnormalities of gait and mobility: Secondary | ICD-10-CM

## 2021-03-18 DIAGNOSIS — R41842 Visuospatial deficit: Secondary | ICD-10-CM

## 2021-03-18 DIAGNOSIS — R278 Other lack of coordination: Secondary | ICD-10-CM

## 2021-03-18 DIAGNOSIS — I69318 Other symptoms and signs involving cognitive functions following cerebral infarction: Secondary | ICD-10-CM

## 2021-03-18 DIAGNOSIS — M6281 Muscle weakness (generalized): Secondary | ICD-10-CM

## 2021-03-18 DIAGNOSIS — R41841 Cognitive communication deficit: Secondary | ICD-10-CM

## 2021-03-18 DIAGNOSIS — I69351 Hemiplegia and hemiparesis following cerebral infarction affecting right dominant side: Secondary | ICD-10-CM | POA: Diagnosis not present

## 2021-03-18 NOTE — Therapy (Signed)
[facility] [address] [place], [place], [address] Phone: [phone]   Fax:  [phone]  Physical Therapy Treatment  Patient Details  Name: Felicia Chen MRN: [id number] Date of Birth: 30-Apr-1965 Referring Provider (PT): Dr. Dagoberto Ligas   Encounter Date: 03/18/2021   PT End of Session - 03/18/21 0933     Visit Number 6    Number of Visits 25    Date for PT Re-Evaluation 05/17/21    Authorization Type UHC -40 visit limit per discipline?    PT Start Time [phone]    PT Stop Time 1017    PT Time Calculation (min) 46 min    Equipment Utilized During Treatment Gait belt    Activity Tolerance Patient tolerated treatment well   limited by low BP   Behavior During Therapy WFL for tasks assessed/performed             Past Medical History:  Diagnosis Date   Anxiety    Gilbert's syndrome     Past Surgical History:  Procedure Laterality Date   ABDOMINAL HYSTERECTOMY N/A 06/11/2012   Procedure: HYSTERECTOMY ABDOMINAL;  Surgeon: Allena Katz, MD;  Location: [facility];  Service: Gynecology;  Laterality: N/A;   CESAREAN SECTION     x 4   CYSTOSCOPY N/A 06/11/2012   Procedure: CYSTOSCOPY;  Surgeon: Allena Katz, MD;  Location: [facility];  Service: Gynecology;  Laterality: N/A;   HERNIA REPAIR     LAPAROSCOPIC ASSISTED VAGINAL HYSTERECTOMY N/A 06/11/2012   Procedure: LAPAROSCOPIC ASSISTED VAGINAL HYSTERECTOMY atttempted;  Surgeon: Allena Katz, MD;  Location: [facility];  Service: Gynecology;  Laterality: N/A;  Attempted Laparoscopic assisted vaginal hysterectomy.   LAPAROSCOPIC LYSIS OF ADHESIONS N/A 06/11/2012   Procedure: LAPAROSCOPIC LYSIS OF ADHESIONS;  Surgeon: Allena Katz, MD;  Location: [facility];  Service: Gynecology;  Laterality: N/A;   SALPINGOOPHORECTOMY Bilateral 06/11/2012   Procedure: SALPINGO OOPHORECTOMY;  Surgeon: Allena Katz, MD;  Location: [facility];  Service: Gynecology;  Laterality:  Bilateral;    Vitals:   03/18/21 1010 03/18/21 1014 03/18/21 1017  BP: (!) 83/63 104/68 117/88  Pulse: (!) 102 (!) 105 (!) 106     Subjective Assessment - 03/18/21 0933     Subjective Has been doing her exercises at home. Have done the stairs with family and it went well. No falls.    Patient is accompained by: Family member   [name], [name]   Pertinent History anxiety, Gilbert's syndrome, obesity, HLD.    Limitations Walking;House hold activities;Standing    How long can you walk comfortably? 10 minutes    Patient Stated Goals wants to get back to normal.    Currently in Pain? No/denies    Pain Onset More than a month ago                               [facility] Adult PT Treatment/Exercise - 03/18/21 0001       Transfers   Transfers Sit to Stand;Stand to Sit    Sit to Stand 4: Min guard    Sit to Stand Details Verbal cues for sequencing;Verbal cues for technique;Tactile cues for weight shifting    Stand to Sit 5: Supervision    Comments Performed throughout session with cues to scoot out towards edge, proper foot placement, leaning forwards and using BUE support to press to stand. Pt does better  also using RUE from mat to come to stand with proper foot positioning.      Ambulation/Gait   Ambulation/Gait Yes    Ambulation/Gait Assistance 4: Min guard;4: Min assist    Ambulation/Gait Assistance Details Used Bioness to R ankle DF during gait. Initially used quick fit electrodes. Pt with good response to ankle DF in sitting, but more external rotation during gait. PT providing tactile/manual cues at pt's pelvis to improve alignment and to help decrease externally rotated position. During 2nd bout of gait, performed with steering electrodes, with slight improvement noted, but still needing tactile cues at pelvis.    Ambulation Distance (Feet) 115 Feet   x2   Assistive device Rolling walker    Gait Pattern Step-through pattern;Decreased stance time -  right;Decreased step length - left;Decreased hip/knee flexion - right;Decreased dorsiflexion - right;Decreased weight shift to right;Poor foot clearance - right;Right foot flat;Wide base of support;Abducted- right    Ambulation Surface Level;Indoor    Gait Comments Discussed with pt wearing sweatpants with shorts undearneath to a future session to trial hamstring cuff.      Neuro Re-ed    Neuro Re-ed Details  Seated: with Bioness to RLE, x10 reps ankle DF x5 seconds during on time, rest during off time. Attempted to perform standing mini squats at RW with use of Bioness, but pt reporting feeling nausea/lightheaded and needed to sit down.      Modalities   Modalities Teacher, English as a foreign language Location Ankle DF with use of Bioness to RLE    Printmaker Action For strengthening, NMR    Electrical Stimulation Parameters See tablet 1, initially with quick fit electrodes, then steering electrodes    Electrical Stimulation Goals Strength;Tone;Neuromuscular facilitation                       PT Short Term Goals - 02/17/21 1201       PT SHORT TERM GOAL #1   Title Pt will be independent with initial HEP in with family supervision in order to build upon functional gains made in therapy. ALL STGS DUE 03/17/21    Time 4    Period Weeks    Status New    Target Date 03/17/21      PT SHORT TERM GOAL #2   Title Pt will undergo further assessment of BERG with LTG written as appropriate.    Time 4    Period Weeks    Status New      PT SHORT TERM GOAL #3   Title Pt will improve gait speed to at least 1.25 ft/sec in order to demo improved household mobility.    Baseline .90 ft/sec with RW    Time 4    Period Weeks    Status New      PT SHORT TERM GOAL #4   Title Pt will ambulate at least 79' with RW and supervision in order to demo improved household mobility.    Time 4    Period Weeks    Status New      PT SHORT  TERM GOAL #5   Title Pt will perform 5x sit <> stand with UE support in 21 seconds or less with supervision in order to demo decr fall risk.    Baseline 23.69 seconds with min guard    Time 4    Period Weeks    Status New      Additional Short  Term Goals   Additional Short Term Goals Yes      PT SHORT TERM GOAL #6   Title --    Time --    Period --    Status --               PT Long Term Goals - 02/17/21 1205       PT LONG TERM GOAL #1   Title Pt will be independent with final HEP in with family supervision in order to build upon functional gains made in therapy. ALL LTGS DUE 04/14/21    Time 8    Period Weeks    Status New    Target Date 04/14/21      PT LONG TERM GOAL #2   Title BERG goal written to demo decr fall risk.    Time 8    Period Weeks    Status New      PT LONG TERM GOAL #3   Title Pt will ambulate at least 300' outdoors with RW and supervision in order to demo improved community mobility.    Time 8    Period Weeks    Status New      PT LONG TERM GOAL #4   Title Pt will go up and down 12 stairs with single handrail with supervision/min guard with step to pattern in order to safely go up/down the stairs in her house.    Baseline not yet assessed.    Time 8    Period Weeks    Status New      PT LONG TERM GOAL #5   Title Pt will perform 5x sit <> stand with UE support in 16 seconds or less with supervision in order to demo decr fall risk.    Baseline 23.69 seconds with min guard    Time 8    Period Weeks    Status New      Additional Long Term Goals   Additional Long Term Goals Yes      PT LONG TERM GOAL #6   Title Pt will improve gait speed to at least 1.9 ft/sec in order to demo improved household mobility and decr fall risk.    Baseline .90 ft/sec with RW    Time 8    Period Weeks    Status New                   Plan - 03/18/21 1253     Clinical Impression Statement Set up Bioness R lower cuff today for gait training/ankle  DF strengthening. Pt with good response in sitting with ankle DF, however, not as good as a response today during gait, with R foot still externally rotating at times (likely coming from pt's hip) and decr foot clearance. Will continue to trial. Limited at end of session due to pt feeling lightheaded/nausea. Monitored BP (see above vitals) and had pt lay supine and then sit and drink water due to orthostatics before going home. BP going back up and pt feeling asymptomatic at the end of the session. Pt used to have orthostatic episodes in inpatient rehab, but has not had one yet with OPPT. Pt reports she has not drank a lot of water yesterday or today, which could have played a factor. PTA monitoring pt at end of session as primary PT had to get next patient, after resting in sitting in 5 minutes and after drinking water, pt's BP was 134/89. Will continue to monitor in future sessions.  Personal Factors and Comorbidities Comorbidity 3+;Past/Current Experience;Time since onset of injury/illness/exacerbation    Comorbidities anxiety, Gilbert's syndrome, obesity, HLD    Examination-Activity Limitations Bathing;Bend;Caring for Others;Toileting;Stand;Stairs;Squat;Reach Overhead;Locomotion Level;Transfers    Examination-Participation Restrictions Cleaning;Community Activity;Driving;Medication Management    Stability/Clinical Decision Making Evolving/Moderate complexity    Rehab Potential Good    PT Frequency 2x / week    PT Duration 12 weeks    PT Treatment/Interventions ADLs/Self Care Home Management;Electrical Stimulation;DME Instruction;Gait training;Stair training;Functional mobility training;Therapeutic activities;Aquatic Therapy;Therapeutic exercise;Balance training;Neuromuscular re-education;Manual techniques;Orthotic Fit/Training;Patient/family education;Passive range of motion;Energy conservation;Vestibular;Visual/perceptual remediation/compensation    PT Next Visit Plan monitor BP. check STGs (i would  hold off on BERG due to time constraints). continue Bioness (with steering electrodes) for strengthening/gait and try hamstring cuff. continue sit to stands with equal/RLE weight bearing. RLE NMR and strengthening. Gait training with RW - ottobock PLS AFO worked well, might wanna try Fabio Asa too just to see.  Continue to practice stairs.    PT Home Exercise Plan [id number]    Consulted and Agree with Plan of Care Patient;Family member/caregiver             Patient will benefit from skilled therapeutic intervention in order to improve the following deficits and impairments:  Abnormal gait, Decreased activity tolerance, Decreased balance, Decreased cognition, Decreased endurance, Decreased coordination, Decreased range of motion, Decreased safety awareness, Decreased strength, Hypomobility, Increased edema, Impaired sensation, Postural dysfunction, Impaired flexibility, Impaired tone, Difficulty walking, Impaired UE functional use, Impaired vision/preception  Visit Diagnosis: Other abnormalities of gait and mobility  Muscle weakness (generalized)  Unsteadiness on feet  Other lack of coordination     Problem List Patient Active Problem List   Diagnosis Date Noted   Nausea without vomiting    Spastic hemiparesis (HCC)    History of hypertension    Neurogenic orthostatic hypotension (HCC)    Transaminitis    Right hemiplegia ([place])    Hemorrhagic stroke (HCC)    Leucocytosis    Essential hypertension    Dyslipidemia    Right hemiparesis ([place])    ICH (intracerebral hemorrhage) ([place]) 12/01/2020   HYPERLIPIDEMIA 07/06/2009   OBESITY 07/06/2009    Arliss Journey, PT, DPT  03/18/2021, 1:10 PM  [facility] [address] [place], [place], [address] Phone: [phone]   Fax:  [phone]  Name: Felicia Chen MRN: [id number] Date of Birth: 27-Dec-1965

## 2021-03-18 NOTE — Therapy (Signed)
[facility] [address] [place], [place], [address] Phone: [phone]   Fax:  [phone]  Speech Language Pathology Treatment  Patient Details  Name: Felicia Chen MRN: [id number] Date of Birth: Jan 30, 1966 Referring Provider (SLP): Frann Rider NP   Encounter Date: 03/18/2021   End of Session - 03/18/21 0803     Visit Number 5    Number of Visits 25    Date for SLP Re-Evaluation 05/11/21    Authorization Type UHC, 40 per discipline per notes    Authorization - Visit Number 5    Authorization - Number of Visits 57    SLP Start Time 0805   pt arrived late   SLP Stop Time  0845    SLP Time Calculation (min) 40 min    Activity Tolerance Patient tolerated treatment well             Past Medical History:  Diagnosis Date   Anxiety    Gilbert's syndrome     Past Surgical History:  Procedure Laterality Date   ABDOMINAL HYSTERECTOMY N/A 06/11/2012   Procedure: HYSTERECTOMY ABDOMINAL;  Surgeon: Allena Katz, MD;  Location: [facility];  Service: Gynecology;  Laterality: N/A;   CESAREAN SECTION     x 4   CYSTOSCOPY N/A 06/11/2012   Procedure: CYSTOSCOPY;  Surgeon: Allena Katz, MD;  Location: [facility];  Service: Gynecology;  Laterality: N/A;   HERNIA REPAIR     LAPAROSCOPIC ASSISTED VAGINAL HYSTERECTOMY N/A 06/11/2012   Procedure: LAPAROSCOPIC ASSISTED VAGINAL HYSTERECTOMY atttempted;  Surgeon: Allena Katz, MD;  Location: [facility];  Service: Gynecology;  Laterality: N/A;  Attempted Laparoscopic assisted vaginal hysterectomy.   LAPAROSCOPIC LYSIS OF ADHESIONS N/A 06/11/2012   Procedure: LAPAROSCOPIC LYSIS OF ADHESIONS;  Surgeon: Allena Katz, MD;  Location: [facility];  Service: Gynecology;  Laterality: N/A;   SALPINGOOPHORECTOMY Bilateral 06/11/2012   Procedure: SALPINGO OOPHORECTOMY;  Surgeon: Allena Katz, MD;  Location: [facility];  Service: Gynecology;  Laterality: Bilateral;    There  were no vitals filed for this visit.   Subjective Assessment - 03/18/21 0804     Subjective "It's going well"    Patient is accompained by: Family member   daughter   Currently in Pain? No/denies                   ADULT SLP TREATMENT - 03/18/21 0803       General Information   Behavior/Cognition Alert;Cooperative;Pleasant mood      Treatment Provided   Treatment provided Cognitive-Linquistic      Cognitive-Linquistic Treatment   Treatment focused on Cognition;Patient/family/caregiver education    Skilled Treatment Pt recalled recent medication changes, which have caused increased anxiety and fatigue. Overall improvements in cognition reported but some delayed processing persists. Extra processing time has reportedly been effective for recall in conversation. SLP targeted executive functioning task, in which pt noted to work quickly and intermittently request SLP affirmation. Task was completed accurately without SLP A. Pt noted errors for spelling due to feeling rushed although no time constraint present. SLP continues to encourage pt and family to allow patient to think and perform tasks independently to reduce reliance on family.      Assessment / Recommendations / Plan   Plan Continue with current plan of care      Progression Toward Goals   Progression toward goals Progressing toward goals  SLP Education - 03/18/21 [phone]     Education Details slow down, impact of internal pressure, other cog apps    Person(s) Educated Patient;Child(ren)    Methods Explanation;Demonstration;Handout    Comprehension Verbalized understanding;Returned demonstration;Need further instruction              SLP Short Term Goals - 03/18/21 0816       SLP SHORT TERM GOAL #1   Title Pt will be independent in mediation management with no missed meds over 1 week    Status Achieved      SLP SHORT TERM GOAL #2   Title Pt will carryover 2 compensations for attention to  complete 2 IADL's over 1 week with rare min A    Time 2    Period Weeks    Status On-going      SLP SHORT TERM GOAL #3   Title Pt will carryover compensations for attention, processing and sequencing to complete light meal prep    Time 2    Period Weeks    Status On-going      SLP SHORT TERM GOAL #4   Title Pt will keep agenda or calendar to manage her daily appointments and to do list with rare min A from family    Status Achieved              SLP Long Term Goals - 03/18/21 0818       SLP LONG TERM GOAL #1   Title Pt will carryover 3 compensations for attention and error awareness to pay 3 bills with rare min A from family    Time 10    Period Weeks    Status On-going      SLP LONG TERM GOAL #2   Title Pt will ID and self correct 80% of errors on cognitive linguistic tasks with rare min A    Time 10    Period Weeks    Status On-going      SLP LONG TERM GOAL #3   Title Pt will complete 3 executive function tasks with 90% accuracy and rare min A    Time 10    Period Weeks    Status On-going              Plan - 03/18/21 0804     Clinical Impression Statement Felicia Chen is referred for outpt ST due to cognitive impairments s/p L hemorrhagic CVA. Today, she is accompanied by her daughter. 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 Felicia Chen also managed her household with several children at home ages 103-28. At this time, pt is now managing her medications and appointments with success. Conducted ongoing education and training of functional cognitive strategies and tasks to implement at home to increase independence. HEP provided. I recommend skilled ST to maximize cognition for return to PLOF, for safety and independence.    Speech Therapy Frequency 2x / week    Duration 12 weeks    Treatment/Interventions Environmental controls;Cueing hierarchy;SLP instruction and feedback;Compensatory strategies;Functional  tasks;Cognitive reorganization;Compensatory techniques;Internal/external aids;Multimodal communcation approach;Patient/family education    Potential to Achieve Goals Good             Patient will benefit from skilled therapeutic intervention in order to improve the following deficits and impairments:   Cognitive communication deficit    Problem List Patient Active Problem List   Diagnosis Date Noted   Nausea without vomiting    Spastic hemiparesis ([place])  History of hypertension    Neurogenic orthostatic hypotension (HCC)    Transaminitis    Right hemiplegia (HCC)    Hemorrhagic stroke (HCC)    Leucocytosis    Essential hypertension    Dyslipidemia    Right hemiparesis (HCC)    ICH (intracerebral hemorrhage) ([place]) 12/01/2020   HYPERLIPIDEMIA 07/06/2009   OBESITY 07/06/2009    Alinda Deem, CCC-SLP 03/18/2021, 9:41 AM  [facility] [address] [place], [place], [address] Phone: [phone]   Fax:  [phone]   Name: ODELL FASCHING MRN: [id number] Date of Birth: Dec 10, 1965

## 2021-03-18 NOTE — Patient Instructions (Signed)
[employer] games Jig saw puzzles Easy cross words Memory match Board games Domino[employer]ong Learn a new game!  Listen to and discuss Ted Talks or Podcasts Read and discuss short articles of interest to you- Take notes on these if memory is a challenge Discuss social media posts Look and discuss photo albums  The best activities to improve cognition are functional, real life activities that are important to you:  Plan a menu Participate in household chores and decisions (with supervision) Participate in [place] a party, trip or tailgate with all of the details (even if you aren't really going to carry it out) Participate in your hobby as you are able with assistance Manage your texts, emails with supervision if needed. Google search for items (even if you're not really going to buy anything) and compare prices and features Socialize -  however, too many visitors can be overwhelming, so set limits "My doctor said I should only visit (or talk) for 20 minutes" or "I do better when I visit with just 1-2 people at a time for 20 minutes"    It's good to use real in-person games, not just apps  Apps:  [employer] Elevate

## 2021-03-18 NOTE — Telephone Encounter (Signed)
Husband notified and request sent to [facility].

## 2021-03-18 NOTE — Therapy (Signed)
[facility] [address] [place], [place], [address] Phone: [phone]   Fax:  [phone]  Occupational Therapy Treatment  Patient Details  Name: Felicia Chen MRN: [id number] Date of Birth: 11/13/1965 Referring Provider (OT): Dr. Courtney Heys   Encounter Date: 03/18/2021   OT End of Session - 03/18/21 0854     Visit Number 7    Number of Visits 25    Date for OT Re-Evaluation 05/17/21    Authorization Type UHC: VL 40 per discipline, no auth required    OT Start Time 0848    OT Stop Time 0930    OT Time Calculation (min) 42 min    Activity Tolerance Patient tolerated treatment well    Behavior During Therapy [facility] for tasks assessed/performed             Past Medical History:  Diagnosis Date   Anxiety    Gilbert's syndrome     Past Surgical History:  Procedure Laterality Date   ABDOMINAL HYSTERECTOMY N/A 06/11/2012   Procedure: HYSTERECTOMY ABDOMINAL;  Surgeon: Allena Katz, MD;  Location: [facility];  Service: Gynecology;  Laterality: N/A;   CESAREAN SECTION     x 4   CYSTOSCOPY N/A 06/11/2012   Procedure: CYSTOSCOPY;  Surgeon: Allena Katz, MD;  Location: [facility];  Service: Gynecology;  Laterality: N/A;   HERNIA REPAIR     LAPAROSCOPIC ASSISTED VAGINAL HYSTERECTOMY N/A 06/11/2012   Procedure: LAPAROSCOPIC ASSISTED VAGINAL HYSTERECTOMY atttempted;  Surgeon: Allena Katz, MD;  Location: [facility];  Service: Gynecology;  Laterality: N/A;  Attempted Laparoscopic assisted vaginal hysterectomy.   LAPAROSCOPIC LYSIS OF ADHESIONS N/A 06/11/2012   Procedure: LAPAROSCOPIC LYSIS OF ADHESIONS;  Surgeon: Allena Katz, MD;  Location: [facility];  Service: Gynecology;  Laterality: N/A;   SALPINGOOPHORECTOMY Bilateral 06/11/2012   Procedure: SALPINGO OOPHORECTOMY;  Surgeon: Allena Katz, MD;  Location: [facility];  Service: Gynecology;  Laterality: Bilateral;    There were no vitals filed for this  visit.   Subjective Assessment - 03/18/21 0849     Subjective  We might have people over at house for New Year's    Patient is accompanied by: Family member   daughter and son   Pertinent History ICH 12/01/20 Lt frontal/parietal lobes w/ SAH extending over bilat cerebral hemispheres and cerebellum, Lt temporal lobe ischemic CVA w/ residual Rt hemiplegia. PMH: HLD, anxiety, covid-19, Gilbert's syndrome    Limitations fall risk    Currently in Pain? No/denies    Pain Score 0-No pain             Handwriting with copying words from word search with use of ballpoint pen with foam grip and with felt tip pen with foam grip & coban. Pt with more control and legibility with felt tip pen. Some Visual motor and perceptual deficits with adhering to margins and to bottom line with handwriting. Handwriting Without Tears app on ipad for control and smooth strokes with mod difficulty - letter "d"  Flipping Cards regular size playing cards with emphasis on flipping supination with RUE. Pt did well with this task.  Medium Pegs with RUE with good coordination and manipulating pegs in RUE. Pt with increased skill with this activity today - even with low level reaching with shoulder patterns.                         OT Short Term  Goals - 03/10/21 1105       OT SHORT TERM GOAL #1   Title Independent with HEP for RUE shoulder ROM, hand coordination and hand strength    Time 4    Period Weeks    Status On-going    Target Date 03/16/21      OT SHORT TERM GOAL #2   Title Pt to perform all BADLS with no more than min assist    Time 4    Period Weeks    Status On-going      OT SHORT TERM GOAL #3   Title Pt to write name and address with 50% or greater legibility    Time 4    Period Weeks    Status New      OT SHORT TERM GOAL #4   Title Pt to prepare simple snack, sandwich, and microwaveable items consistently using DME prn with distant supervision only    Time 4    Period  Weeks    Status New      OT SHORT TERM GOAL #5   Title Pt to improve coordination as evidenced by performing 9 hole peg test Rt hand in under 1 min. and 15 sec    Baseline 1 min. 45 sec    Time 4    Period Weeks    Status New      OT SHORT TERM GOAL #6   Title Pt to grip strength Rt hand to 30 lbs or greater in prep for opening jars    Baseline 22 lbs (Lt = 65 lbs)    Time 4    Period Weeks    Status On-going               OT Long Term Goals - 02/16/21 1034       OT LONG TERM GOAL #1   Title Independent with strengthening HEP for Rt shoulder    Time 12    Period Weeks    Status New      OT LONG TERM GOAL #2   Title Pt to fully attend to Rt side and return to using Rt hand as dominant hand 90% of the time safely for ADLS w/ task modifications prn    Time 12    Period Weeks    Status New      OT LONG TERM GOAL #3   Title Independent with all BADLS    Time 12    Period Weeks    Status New      OT LONG TERM GOAL #4   Title Pt to perform simple cooking tasks with supervision/min cueing only    Time 12    Period Weeks    Status New      OT LONG TERM GOAL #5   Title Improve coordination Rt hand as evidenced by performing 9 hole peg test in 45 sec or less    Baseline 1 min. 45 sec.    Time 12    Period Weeks    Status New      Long Term Additional Goals   Additional Long Term Goals Yes      OT LONG TERM GOAL #6   Title Pt to improve grip strength Rt hand to 40 lbs or greater to open jars/containers    Baseline 22 lbs (Lt = 65 lbs)    Time 12    Period Weeks    Status New      OT LONG TERM GOAL #7  Title Pt to write name at 90% legibility and 3 sentences at 75% or greater legibility    Time 12    Period Weeks    Status New                   Plan - 03/18/21 0934     Clinical Impression Statement pt with better low level reaching today and receptive of cueing for stabilization for RUE with handwriting and coordination.    OT Occupational  Profile and History Detailed Assessment- Review of Records and additional review of physical, cognitive, psychosocial history related to current functional performance    Occupational performance deficits (Please refer to evaluation for details): ADL's;IADL's;Work;Leisure    Body Structure / Function / Physical Skills ADL;Strength;Dexterity;Balance;Tone;Body mechanics;Edema;Proprioception;UE functional use;IADL;ROM;Endurance;Mobility;Coordination;Sensation;FMC;Decreased knowledge of use of DME    Cognitive Skills Attention;Memory;Perception    Rehab Potential Good    Clinical Decision Making Several treatment options, min-mod task modification necessary    Comorbidities Affecting Occupational Performance: May have comorbidities impacting occupational performance    Modification or Assistance to Complete Evaluation  No modification of tasks or assist necessary to complete eval    OT Frequency 2x / week    OT Duration 12 weeks   PLUS EVAL   OT Treatment/Interventions Self-care/ADL training;DME and/or AE instruction;Moist Heat;Therapeutic activities;Compression bandaging;Splinting;Therapeutic exercise;Ultrasound;Cognitive remediation/compensation;Coping strategies training;Visual/perceptual remediation/compensation;Passive range of motion;Neuromuscular education;Electrical Stimulation;Cryotherapy;Manual Therapy;Patient/family education    Plan continue working on reaching patterns with RUE and increased coordination and handwriting legibility.    Consulted and Agree with Plan of Care Patient;Family member/caregiver    Family Member Consulted husband and daughter             Patient will benefit from skilled therapeutic intervention in order to improve the following deficits and impairments:   Body Structure / Function / Physical Skills: ADL, Strength, Dexterity, Balance, Tone, Body mechanics, Edema, Proprioception, UE functional use, IADL, ROM, Endurance, Mobility, Coordination, Sensation, FMC,  Decreased knowledge of use of DME Cognitive Skills: Attention, Memory, Perception     Visit Diagnosis: Hemiplegia and hemiparesis following cerebral infarction affecting right dominant side (HCC)  Visuospatial deficit  Other abnormalities of gait and mobility  Muscle weakness (generalized)  Other lack of coordination  Unsteadiness on feet  Other symptoms and signs involving cognitive functions following cerebral infarction    Problem List Patient Active Problem List   Diagnosis Date Noted   Nausea without vomiting    Spastic hemiparesis (HCC)    History of hypertension    Neurogenic orthostatic hypotension (HCC)    Transaminitis    Right hemiplegia ([place])    Hemorrhagic stroke (HCC)    Leucocytosis    Essential hypertension    Dyslipidemia    Right hemiparesis ([place])    ICH (intracerebral hemorrhage) ([place]) 12/01/2020   HYPERLIPIDEMIA 07/06/2009   OBESITY 07/06/2009    Zachery Conch, OT 03/18/2021, 9:56 AM  [facility] [address] [place], [place], [address] Phone: [phone]   Fax:  [phone]  Name: Felicia Chen MRN: [id number] Date of Birth: February 11, 1966

## 2021-03-18 NOTE — Telephone Encounter (Signed)
Rewrote a prescription- they can either come get it, or it can be mailed- thanks- ML

## 2021-03-22 ENCOUNTER — Ambulatory Visit: Payer: 59 | Admitting: Occupational Therapy

## 2021-03-22 ENCOUNTER — Ambulatory Visit: Payer: 59

## 2021-03-22 ENCOUNTER — Ambulatory Visit: Payer: 59 | Attending: Physical Medicine and Rehabilitation

## 2021-03-22 ENCOUNTER — Other Ambulatory Visit: Payer: Self-pay

## 2021-03-22 VITALS — BP 123/86 | HR 86

## 2021-03-22 DIAGNOSIS — I69318 Other symptoms and signs involving cognitive functions following cerebral infarction: Secondary | ICD-10-CM

## 2021-03-22 DIAGNOSIS — R41842 Visuospatial deficit: Secondary | ICD-10-CM

## 2021-03-22 DIAGNOSIS — R278 Other lack of coordination: Secondary | ICD-10-CM | POA: Diagnosis present

## 2021-03-22 DIAGNOSIS — R2689 Other abnormalities of gait and mobility: Secondary | ICD-10-CM | POA: Insufficient documentation

## 2021-03-22 DIAGNOSIS — R2681 Unsteadiness on feet: Secondary | ICD-10-CM | POA: Insufficient documentation

## 2021-03-22 DIAGNOSIS — R41841 Cognitive communication deficit: Secondary | ICD-10-CM | POA: Diagnosis present

## 2021-03-22 DIAGNOSIS — I69351 Hemiplegia and hemiparesis following cerebral infarction affecting right dominant side: Secondary | ICD-10-CM

## 2021-03-22 DIAGNOSIS — M6281 Muscle weakness (generalized): Secondary | ICD-10-CM | POA: Diagnosis not present

## 2021-03-22 DIAGNOSIS — R29818 Other symptoms and signs involving the nervous system: Secondary | ICD-10-CM | POA: Diagnosis present

## 2021-03-22 NOTE — Therapy (Signed)
[facility] [address] [place], [place], [address] Phone: [phone]   Fax:  [phone]  Physical Therapy Treatment  Patient Details  Name: Felicia Chen MRN: [id number] Date of Birth: 1965/10/03 Referring Provider (PT): Dr. Dagoberto Ligas   Encounter Date: 03/22/2021   PT End of Session - 03/22/21 0843     Visit Number 7    Number of Visits 25    Date for PT Re-Evaluation 05/17/21    Authorization Type UHC -40 visit limit per discipline?    PT Start Time [phone]    PT Stop Time 0929    PT Time Calculation (min) 43 min    Equipment Utilized During Treatment Gait belt    Activity Tolerance Patient tolerated treatment well   limited by low BP   Behavior During Therapy WFL for tasks assessed/performed             Past Medical History:  Diagnosis Date   Anxiety    Gilbert's syndrome     Past Surgical History:  Procedure Laterality Date   ABDOMINAL HYSTERECTOMY N/A 06/11/2012   Procedure: HYSTERECTOMY ABDOMINAL;  Surgeon: Allena Katz, MD;  Location: [facility];  Service: Gynecology;  Laterality: N/A;   CESAREAN SECTION     x 4   CYSTOSCOPY N/A 06/11/2012   Procedure: CYSTOSCOPY;  Surgeon: Allena Katz, MD;  Location: [facility];  Service: Gynecology;  Laterality: N/A;   HERNIA REPAIR     LAPAROSCOPIC ASSISTED VAGINAL HYSTERECTOMY N/A 06/11/2012   Procedure: LAPAROSCOPIC ASSISTED VAGINAL HYSTERECTOMY atttempted;  Surgeon: Allena Katz, MD;  Location: [facility];  Service: Gynecology;  Laterality: N/A;  Attempted Laparoscopic assisted vaginal hysterectomy.   LAPAROSCOPIC LYSIS OF ADHESIONS N/A 06/11/2012   Procedure: LAPAROSCOPIC LYSIS OF ADHESIONS;  Surgeon: Allena Katz, MD;  Location: [facility];  Service: Gynecology;  Laterality: N/A;   SALPINGOOPHORECTOMY Bilateral 06/11/2012   Procedure: SALPINGO OOPHORECTOMY;  Surgeon: Allena Katz, MD;  Location: [facility];  Service: Gynecology;  Laterality:  Bilateral;    Vitals:   03/22/21 0850 03/22/21 0925  BP: 124/80 123/86  Pulse: 86      Subjective Assessment - 03/22/21 0844     Subjective Patient reports feels like she was lightheadedness due to dehydration. No more episodes since then. Goes to see [facility] on the 10th.    Patient is accompained by: Family member   daughther [name], sister [name]   Pertinent History anxiety, Gilbert's syndrome, obesity, HLD.    Limitations Walking;House hold activities;Standing    How long can you walk comfortably? 10 minutes    Patient Stated Goals wants to get back to normal.    Currently in Pain? No/denies    Pain Onset More than a month ago              [facility] Adult PT Treatment/Exercise - 03/22/21 0001       Transfers   Transfers Sit to Stand;Stand to Sit    Sit to Stand 4: Min guard    Sit to Stand Details Verbal cues for sequencing;Verbal cues for technique;Tactile cues for weight shifting    Five time sit to stand comments  19.59 seconds with single UE support    Stand to Sit 5: Supervision      Ambulation/Gait   Ambulation/Gait Yes    Ambulation/Gait Assistance 4: Min guard;4: Min assist    Ambulation/Gait Assistance Details Completed gait training with R PLS Ott[place]ock Walk On  x 230 ft with continued external rotation noted, PT continue to provide intermittent tactile cues and Min A for improved alignment. Then trialed R Post Thuasne x 115 ft, with no significant gait changes noted. Continue to demo ER. Pt wanting to keep knee extended during swing phase, requiring cues for proper completion.    Ambulation Distance (Feet) 230 Feet   x1, 115 x 1   Assistive device Rolling walker    Gait Pattern Step-through pattern;Decreased stance time - right;Decreased step length - left;Decreased hip/knee flexion - right;Decreased dorsiflexion - right;Decreased weight shift to right;Poor foot clearance - right;Right foot flat;Wide base of support;Abducted- right    Ambulation Surface  Level;Indoor    Gait velocity 19.89 secs = 1.96 ft/sec    Gait Comments intermittent rest breaks due to fatigue, patient did not sleep well.      Standardized Balance Assessment   Standardized Balance Assessment Berg Balance Test      Berg Balance Test   Sit to Stand Able to stand  independently using hands    Standing Unsupported Able to stand 2 minutes with supervision    Sitting with Back Unsupported but Feet Supported on Floor or Stool Able to sit safely and securely 2 minutes    Stand to Sit Controls descent by using hands    Transfers Able to transfer safely, definite need of hands    Standing Unsupported with Eyes Closed Able to stand 10 seconds with supervision    Standing Ubsupported with Feet Together Able to place feet together independently and stand for 1 minute with supervision    From Standing, Reach Forward with Outstretched Arm Can reach forward >12 cm safely (5")   8"   From Standing Position, Pick up Object from [place] to pick up shoe safely and easily    From Standing Position, Turn to Look Behind Over each Shoulder Looks behind one side only/other side shows less weight shift    Turn 360 Degrees Needs close supervision or verbal cueing    Standing Unsupported, Alternately Place Feet on Step/Stool Able to complete >2 steps/needs minimal assist    Standing Unsupported, One Foot in Front Needs help to step but can hold 15 seconds    Standing on One Leg Tries to lift leg/unable to hold 3 seconds but remains standing independently    Total Score 36                     PT Education - 03/22/21 0844     Education Details Progress toward STGs    Person(s) Educated Patient;Child(ren)    Methods Explanation    Comprehension Verbalized understanding              PT Short Term Goals - 03/22/21 0854       PT SHORT TERM GOAL #1   Title Pt will be independent with initial HEP in with family supervision in order to build upon functional gains made in  therapy. ALL STGS DUE 03/17/21    Time 4    Period Weeks    Status New    Target Date 03/17/21      PT SHORT TERM GOAL #2   Title Pt will undergo further assessment of BERG with LTG written as appropriate.    Baseline 36/56 initial assessment    Time 4    Period Weeks    Status Achieved      PT SHORT TERM GOAL #3   Title Pt will improve gait  speed to at least 1.25 ft/sec in order to demo improved household mobility.    Baseline .90 ft/sec with RW; 1.96 ft/sec    Time 4    Period Weeks    Status Achieved      PT SHORT TERM GOAL #4   Title Pt will ambulate at least 8' with RW and supervision in order to demo improved household mobility.    Baseline 230 w/ RW and CGA/MinA    Time 4    Period Weeks    Status Partially Met      PT SHORT TERM GOAL #5   Title Pt will perform 5x sit <> stand with UE support in 21 seconds or less with supervision in order to demo decr fall risk.    Baseline 23.69 seconds with min guard; 19.59 secs with single UE support    Time 4    Period Weeks    Status Achieved               PT Long Term Goals - 02/17/21 1205       PT LONG TERM GOAL #1   Title Pt will be independent with final HEP in with family supervision in order to build upon functional gains made in therapy. ALL LTGS DUE 04/14/21    Time 8    Period Weeks    Status New    Target Date 04/14/21      PT LONG TERM GOAL #2   Title BERG goal written to demo decr fall risk.    Time 8    Period Weeks    Status New      PT LONG TERM GOAL #3   Title Pt will ambulate at least 300' outdoors with RW and supervision in order to demo improved community mobility.    Time 8    Period Weeks    Status New      PT LONG TERM GOAL #4   Title Pt will go up and down 12 stairs with single handrail with supervision/min guard with step to pattern in order to safely go up/down the stairs in her house.    Baseline not yet assessed.    Time 8    Period Weeks    Status New      PT LONG TERM  GOAL #5   Title Pt will perform 5x sit <> stand with UE support in 16 seconds or less with supervision in order to demo decr fall risk.    Baseline 23.69 seconds with min guard    Time 8    Period Weeks    Status New      Additional Long Term Goals   Additional Long Term Goals Yes      PT LONG TERM GOAL #6   Title Pt will improve gait speed to at least 1.9 ft/sec in order to demo improved household mobility and decr fall risk.    Baseline .90 ft/sec with RW    Time 8    Period Weeks    Status New                   Plan - 03/22/21 8469     Clinical Impression Statement Today's skilled PT session included assesment of patient's progress toward STGs. Patient able to meet STG # 2,3, and 5. Patient also partially met STG #4. Patient improved gait speed with RW to 1.96 ft/sec and 5x sit <> stand to 19.59 seconds. Completed Berg Balance with patient scoring  36/56 indicating high fall risk. Continued trial with R AFO, no significant gait changes noted with R Ottobok Walk on and R Thuasne. Due to time unable to review HEP this session, and will complete at next visit.    Personal Factors and Comorbidities Comorbidity 3+;Past/Current Experience;Time since onset of injury/illness/exacerbation    Comorbidities anxiety, Gilbert's syndrome, obesity, HLD    Examination-Activity Limitations Bathing;Bend;Caring for Others;Toileting;Stand;Stairs;Squat;Reach Overhead;Locomotion Level;Transfers    Examination-Participation Restrictions Cleaning;Community Activity;Driving;Medication Management    Stability/Clinical Decision Making Evolving/Moderate complexity    Rehab Potential Good    PT Frequency 2x / week    PT Duration 12 weeks    PT Treatment/Interventions ADLs/Self Care Home Management;Electrical Stimulation;DME Instruction;Gait training;Stair training;Functional mobility training;Therapeutic activities;Aquatic Therapy;Therapeutic exercise;Balance training;Neuromuscular re-education;Manual  techniques;Orthotic Fit/Training;Patient/family education;Passive range of motion;Energy conservation;Vestibular;Visual/perceptual remediation/compensation    PT Next Visit Plan Check STG #1 (did not have time). continue Bioness (with steering electrodes) for strengthening/gait and try hamstring cuff. continue sit to stands with equal/RLE weight bearing. RLE NMR and strengthening. Gait training with RW - ottobock PLS AFO worked well, might wanna try Fabio Asa too just to see.  Continue to practice stairs.    PT Home Exercise Plan [id number]    Consulted and Agree with Plan of Care Patient;Family member/caregiver             Patient will benefit from skilled therapeutic intervention in order to improve the following deficits and impairments:  Abnormal gait, Decreased activity tolerance, Decreased balance, Decreased cognition, Decreased endurance, Decreased coordination, Decreased range of motion, Decreased safety awareness, Decreased strength, Hypomobility, Increased edema, Impaired sensation, Postural dysfunction, Impaired flexibility, Impaired tone, Difficulty walking, Impaired UE functional use, Impaired vision/preception  Visit Diagnosis: Muscle weakness (generalized)  Unsteadiness on feet  Other abnormalities of gait and mobility     Problem List Patient Active Problem List   Diagnosis Date Noted   Nausea without vomiting    Spastic hemiparesis (HCC)    History of hypertension    Neurogenic orthostatic hypotension (HCC)    Transaminitis    Right hemiplegia ([place])    Hemorrhagic stroke (HCC)    Leucocytosis    Essential hypertension    Dyslipidemia    Right hemiparesis (HCC)    ICH (intracerebral hemorrhage) ([place]) 12/01/2020   HYPERLIPIDEMIA 07/06/2009   OBESITY 07/06/2009    Jones Bales, PT, DPT 03/22/2021, 10:25 AM  [facility] [address] [place], [place], [address] Phone: [phone]   Fax:   [phone]  Name: Felicia Chen MRN: [id number] Date of Birth: 1965-09-28

## 2021-03-22 NOTE — Patient Instructions (Signed)
°  1. Grip Strengthening (Resistive Putty)   Squeeze putty using thumb and all fingers. Repeat _15-20___ times. Do __2__ sessions per day.   2. Roll putty into tube on table and pinch between first two fingers and thumb x 10 reps. Do 2 sessions per day

## 2021-03-22 NOTE — Therapy (Signed)
[facility] [address] [place], [place], [address] Phone: [phone]   Fax:  [phone]  Occupational Therapy Treatment  Patient Details  Name: Felicia Chen MRN: [id number] Date of Birth: 20-Nov-1965 Referring Provider (OT): Dr. Courtney Heys   Encounter Date: 03/22/2021   OT End of Session - 03/22/21 0828     Visit Number 8    Number of Visits 25    Date for OT Re-Evaluation 05/17/21    Authorization Type UHC: VL 40 per discipline, no auth required    OT Start Time 0805    OT Stop Time 0845    OT Time Calculation (min) 40 min    Activity Tolerance Patient tolerated treatment well    Behavior During Therapy [facility] for tasks assessed/performed             Past Medical History:  Diagnosis Date   Anxiety    Gilbert's syndrome     Past Surgical History:  Procedure Laterality Date   ABDOMINAL HYSTERECTOMY N/A 06/11/2012   Procedure: HYSTERECTOMY ABDOMINAL;  Surgeon: Allena Katz, MD;  Location: [facility];  Service: Gynecology;  Laterality: N/A;   CESAREAN SECTION     x 4   CYSTOSCOPY N/A 06/11/2012   Procedure: CYSTOSCOPY;  Surgeon: Allena Katz, MD;  Location: [facility];  Service: Gynecology;  Laterality: N/A;   HERNIA REPAIR     LAPAROSCOPIC ASSISTED VAGINAL HYSTERECTOMY N/A 06/11/2012   Procedure: LAPAROSCOPIC ASSISTED VAGINAL HYSTERECTOMY atttempted;  Surgeon: Allena Katz, MD;  Location: [facility];  Service: Gynecology;  Laterality: N/A;  Attempted Laparoscopic assisted vaginal hysterectomy.   LAPAROSCOPIC LYSIS OF ADHESIONS N/A 06/11/2012   Procedure: LAPAROSCOPIC LYSIS OF ADHESIONS;  Surgeon: Allena Katz, MD;  Location: [facility];  Service: Gynecology;  Laterality: N/A;   SALPINGOOPHORECTOMY Bilateral 06/11/2012   Procedure: SALPINGO OOPHORECTOMY;  Surgeon: Allena Katz, MD;  Location: [facility];  Service: Gynecology;  Laterality: Bilateral;    There were no vitals filed for this  visit.   Subjective Assessment - 03/22/21 0808     Subjective  I just feel weak from constipation    Patient is accompanied by: Family member   daughter and son   Pertinent History ICH 12/01/20 Lt frontal/parietal lobes w/ SAH extending over bilat cerebral hemispheres and cerebellum, Lt temporal lobe ischemic CVA w/ residual Rt hemiplegia. PMH: HLD, anxiety, covid-19, Gilbert's syndrome    Limitations fall risk    Currently in Pain? No/denies             Reviewed/clarified some fine motor ex's and proper positioning from coordination HEP (daughters present for education). Reviewed shoulder HEP. Issued putty HEP for grip and pinch strength (issued red putty)   Pt practiced placing medium sized pegs in pegboard Rt hand copying design at 100% accuracy. Practiced flipping cards Rt hand while preventing shoulder hiking.   9 hole peg test RT = 52 sec Rt grip strength = 24.4 lbs                     OT Education - 03/22/21 0821     Education Details putty HEP    Person(s) Educated Patient;Child(ren)    Methods Explanation;Demonstration;Verbal cues;Handout    Comprehension Verbalized understanding;Returned demonstration;Verbal cues required              OT Short Term Goals - 03/22/21 0829       OT SHORT TERM GOAL #1  Title Independent with HEP for RUE shoulder ROM, hand coordination and hand strength    Time 4    Period Weeks    Status On-going   Issued all, not consistent performing and continues to need cueing   Target Date 03/16/21      OT SHORT TERM GOAL #2   Title Pt to perform all BADLS with no more than min assist    Time 4    Period Weeks    Status Achieved      OT SHORT TERM GOAL #3   Title Pt to write name and address with 50% or greater legibility    Time 4    Period Weeks    Status On-going      OT SHORT TERM GOAL #4   Title Pt to prepare simple snack, sandwich, and microwaveable items consistently using DME prn with distant supervision  only    Time 4    Period Weeks    Status New      OT SHORT TERM GOAL #5   Title Pt to improve coordination as evidenced by performing 9 hole peg test Rt hand in under 1 min. and 15 sec    Baseline 1 min. 45 sec    Time 4    Period Weeks    Status Achieved   52.87 sec     OT SHORT TERM GOAL #6   Title Pt to grip strength Rt hand to 30 lbs or greater in prep for opening jars    Baseline 22 lbs (Lt = 65 lbs)    Time 4    Period Weeks    Status On-going   24.4 lbs              OT Long Term Goals - 02/16/21 1034       OT LONG TERM GOAL #1   Title Independent with strengthening HEP for Rt shoulder    Time 12    Period Weeks    Status New      OT LONG TERM GOAL #2   Title Pt to fully attend to Rt side and return to using Rt hand as dominant hand 90% of the time safely for ADLS w/ task modifications prn    Time 12    Period Weeks    Status New      OT LONG TERM GOAL #3   Title Independent with all BADLS    Time 12    Period Weeks    Status New      OT LONG TERM GOAL #4   Title Pt to perform simple cooking tasks with supervision/min cueing only    Time 12    Period Weeks    Status New      OT LONG TERM GOAL #5   Title Improve coordination Rt hand as evidenced by performing 9 hole peg test in 45 sec or less    Baseline 1 min. 45 sec.    Time 12    Period Weeks    Status New      Long Term Additional Goals   Additional Long Term Goals Yes      OT LONG TERM GOAL #6   Title Pt to improve grip strength Rt hand to 40 lbs or greater to open jars/containers    Baseline 22 lbs (Lt = 65 lbs)    Time 12    Period Weeks    Status New      OT LONG TERM GOAL #7  Title Pt to write name at 90% legibility and 3 sentences at 75% or greater legibility    Time 12    Period Weeks    Status New                   Plan - 03/22/21 0830     Clinical Impression Statement Pt continues to need cueing and reinforcement with coordination activities and proper  reaching pattern. Pt has been inconsistent performing coordination HEP at home d/t frustration level and over holidays    OT Occupational Profile and History Detailed Assessment- Review of Records and additional review of physical, cognitive, psychosocial history related to current functional performance    Occupational performance deficits (Please refer to evaluation for details): ADL's;IADL's;Work;Leisure    Body Structure / Function / Physical Skills ADL;Strength;Dexterity;Balance;Tone;Body mechanics;Edema;Proprioception;UE functional use;IADL;ROM;Endurance;Mobility;Coordination;Sensation;FMC;Decreased knowledge of use of DME    Cognitive Skills Attention;Memory;Perception    Rehab Potential Good    Clinical Decision Making Several treatment options, min-mod task modification necessary    Comorbidities Affecting Occupational Performance: May have comorbidities impacting occupational performance    Modification or Assistance to Complete Evaluation  No modification of tasks or assist necessary to complete eval    OT Frequency 2x / week    OT Duration 12 weeks   PLUS EVAL   OT Treatment/Interventions Self-care/ADL training;DME and/or AE instruction;Moist Heat;Therapeutic activities;Compression bandaging;Splinting;Therapeutic exercise;Ultrasound;Cognitive remediation/compensation;Coping strategies training;Visual/perceptual remediation/compensation;Passive range of motion;Neuromuscular education;Electrical Stimulation;Cryotherapy;Manual Therapy;Patient/family education    Plan finish checking STG's, work on shoulder NMR supine and seated    Consulted and Agree with Plan of Care Patient;Family member/caregiver    Family Member Consulted husband and daughter             Patient will benefit from skilled therapeutic intervention in order to improve the following deficits and impairments:   Body Structure / Function / Physical Skills: ADL, Strength, Dexterity, Balance, Tone, Body mechanics, Edema,  Proprioception, UE functional use, IADL, ROM, Endurance, Mobility, Coordination, Sensation, FMC, Decreased knowledge of use of DME Cognitive Skills: Attention, Memory, Perception     Visit Diagnosis: Hemiplegia and hemiparesis following cerebral infarction affecting right dominant side (HCC)  Visuospatial deficit  Other lack of coordination  Other symptoms and signs involving cognitive functions following cerebral infarction    Problem List Patient Active Problem List   Diagnosis Date Noted   Nausea without vomiting    Spastic hemiparesis (HCC)    History of hypertension    Neurogenic orthostatic hypotension (HCC)    Transaminitis    Right hemiplegia ([place])    Hemorrhagic stroke ([place])    Leucocytosis    Essential hypertension    Dyslipidemia    Right hemiparesis ([place])    ICH (intracerebral hemorrhage) ([place]) 12/01/2020   HYPERLIPIDEMIA 07/06/2009   OBESITY 07/06/2009    Carey Bullocks, OTR/L 03/22/2021, 10:52 AM  [facility] [address] [place], [place], [address] Phone: [phone]   Fax:  [phone]  Name: ELESE RANE MRN: [id number] Date of Birth: [birth year]

## 2021-03-24 ENCOUNTER — Encounter: Payer: Self-pay | Admitting: Physical Therapy

## 2021-03-24 ENCOUNTER — Ambulatory Visit: Payer: 59 | Admitting: Physical Therapy

## 2021-03-24 ENCOUNTER — Ambulatory Visit: Payer: 59

## 2021-03-24 ENCOUNTER — Other Ambulatory Visit: Payer: Self-pay

## 2021-03-24 ENCOUNTER — Ambulatory Visit: Payer: 59 | Admitting: Occupational Therapy

## 2021-03-24 DIAGNOSIS — R41841 Cognitive communication deficit: Secondary | ICD-10-CM

## 2021-03-24 DIAGNOSIS — I69351 Hemiplegia and hemiparesis following cerebral infarction affecting right dominant side: Secondary | ICD-10-CM

## 2021-03-24 DIAGNOSIS — R2681 Unsteadiness on feet: Secondary | ICD-10-CM

## 2021-03-24 DIAGNOSIS — I69318 Other symptoms and signs involving cognitive functions following cerebral infarction: Secondary | ICD-10-CM

## 2021-03-24 DIAGNOSIS — R41842 Visuospatial deficit: Secondary | ICD-10-CM

## 2021-03-24 DIAGNOSIS — M6281 Muscle weakness (generalized): Secondary | ICD-10-CM | POA: Diagnosis not present

## 2021-03-24 DIAGNOSIS — R278 Other lack of coordination: Secondary | ICD-10-CM

## 2021-03-24 NOTE — Therapy (Signed)
[facility] [address] [place], [place], [address] Phone: [phone]   Fax:  [phone]  Occupational Therapy Treatment  Patient Details  Name: Felicia Chen MRN: [id number] Date of Birth: 11/24/1965 Referring Provider (OT): Dr. Courtney Heys   Encounter Date: 03/24/2021   OT End of Session - 03/24/21 0931     Visit Number 9    Number of Visits 25    Date for OT Re-Evaluation 05/17/21    Authorization Type UHC: VL 40 per discipline, no auth required    OT Start Time 0805    OT Stop Time 0845    OT Time Calculation (min) 40 min    Activity Tolerance Patient tolerated treatment well    Behavior During Therapy [facility] for tasks assessed/performed             Past Medical History:  Diagnosis Date   Anxiety    Gilbert's syndrome     Past Surgical History:  Procedure Laterality Date   ABDOMINAL HYSTERECTOMY N/A 06/11/2012   Procedure: HYSTERECTOMY ABDOMINAL;  Surgeon: Allena Katz, MD;  Location: [facility];  Service: Gynecology;  Laterality: N/A;   CESAREAN SECTION     x 4   CYSTOSCOPY N/A 06/11/2012   Procedure: CYSTOSCOPY;  Surgeon: Allena Katz, MD;  Location: [facility];  Service: Gynecology;  Laterality: N/A;   HERNIA REPAIR     LAPAROSCOPIC ASSISTED VAGINAL HYSTERECTOMY N/A 06/11/2012   Procedure: LAPAROSCOPIC ASSISTED VAGINAL HYSTERECTOMY atttempted;  Surgeon: Allena Katz, MD;  Location: [facility];  Service: Gynecology;  Laterality: N/A;  Attempted Laparoscopic assisted vaginal hysterectomy.   LAPAROSCOPIC LYSIS OF ADHESIONS N/A 06/11/2012   Procedure: LAPAROSCOPIC LYSIS OF ADHESIONS;  Surgeon: Allena Katz, MD;  Location: [facility];  Service: Gynecology;  Laterality: N/A;   SALPINGOOPHORECTOMY Bilateral 06/11/2012   Procedure: SALPINGO OOPHORECTOMY;  Surgeon: Allena Katz, MD;  Location: [facility];  Service: Gynecology;  Laterality: Bilateral;    There were no vitals filed for this  visit.   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 PMH: HLD, anxiety, covid-19, Gilbert's syndrome    Limitations fall risk    Currently in Pain? No/denies              Pt transferred w/c to mat w/ min assist for safety.  Supine: BUE sh flexion w/ thick foam w/ cues to slow down and come back down to thighs.  Seated: BUE AA/ROM into gravity, gravity elim plane, and on inclined surface w/ focus on preventing Rt shoulder compensations. Progressed to higher closed chain sh flexion using UE Ranger for RUE w/ min cues.  Encouraged pt to use RUE for low to mid level open chain reaching for safe activities   Practiced writing name and address in print with 90% legibility with extra time required. Pt met STG #3                      OT Short Term Goals - 03/24/21 0929       OT SHORT TERM GOAL #1   Title Independent with HEP for RUE shoulder ROM, hand coordination and hand strength  Time 4    Period Weeks    Status Achieved    Target Date 03/16/21      OT SHORT TERM GOAL #2   Title Pt to perform all BADLS with no more than min assist    Time 4    Period Weeks    Status Achieved      OT SHORT TERM GOAL #3   Title Pt to write name and address with 50% or greater legibility    Time 4    Period Weeks    Status Achieved   90% legibility w/ extra time     OT SHORT TERM GOAL #4   Title Pt to prepare simple snack, sandwich, and microwaveable items consistently using DME prn with distant supervision only    Time 4    Period Weeks    Status New      OT SHORT TERM GOAL #5   Title Pt to improve coordination as evidenced by performing 9 hole peg test Rt hand in under 1 min. and 15 sec     Baseline 1 min. 45 sec    Time 4    Period Weeks    Status Achieved   52.87 sec     OT SHORT TERM GOAL #6   Title Pt to grip strength Rt hand to 30 lbs or greater in prep for opening jars    Baseline 22 lbs (Lt = 65 lbs)    Time 4    Period Weeks    Status On-going   24.4 lbs              OT Long Term Goals - 02/16/21 1034       OT LONG TERM GOAL #1   Title Independent with strengthening HEP for Rt shoulder    Time 12    Period Weeks    Status New      OT LONG TERM GOAL #2   Title Pt to fully attend to Rt side and return to using Rt hand as dominant hand 90% of the time safely for ADLS w/ task modifications prn    Time 12    Period Weeks    Status New      OT LONG TERM GOAL #3   Title Independent with all BADLS    Time 12    Period Weeks    Status New      OT LONG TERM GOAL #4   Title Pt to perform simple cooking tasks with supervision/min cueing only    Time 12    Period Weeks    Status New      OT LONG TERM GOAL #5   Title Improve coordination Rt hand as evidenced by performing 9 hole peg test in 45 sec or less    Baseline 1 min. 45 sec.    Time 12    Period Weeks    Status New      Long Term Additional Goals   Additional Long Term Goals Yes      OT LONG TERM GOAL #6   Title Pt to improve grip strength Rt hand to 40 lbs or greater to open jars/containers    Baseline 22 lbs (Lt = 65 lbs)    Time 12    Period Weeks    Status New      OT LONG TERM GOAL #7   Title Pt to write name at 90% legibility and 3 sentences at 75% or greater legibility  Time 12    Period Weeks    Status New                   Plan - 03/24/21 0929     Clinical Impression Statement Pt improving with low to mid range reaching w/ less compensation at shoulder    OT Occupational Profile and History Detailed Assessment- Review of Records and additional review of physical, cognitive, psychosocial history related to current functional performance    Occupational  performance deficits (Please refer to evaluation for details): ADL's;IADL's;Work;Leisure    Body Structure / Function / Physical Skills ADL;Strength;Dexterity;Balance;Tone;Body mechanics;Edema;Proprioception;UE functional use;IADL;ROM;Endurance;Mobility;Coordination;Sensation;FMC;Decreased knowledge of use of DME    Cognitive Skills Attention;Memory;Perception    Rehab Potential Good    Clinical Decision Making Several treatment options, min-mod task modification necessary    Comorbidities Affecting Occupational Performance: May have comorbidities impacting occupational performance    Modification or Assistance to Complete Evaluation  No modification of tasks or assist necessary to complete eval    OT Frequency 2x / week    OT Duration 12 weeks   PLUS EVAL   OT Treatment/Interventions Self-care/ADL training;DME and/or AE instruction;Moist Heat;Therapeutic activities;Compression bandaging;Splinting;Therapeutic exercise;Ultrasound;Cognitive remediation/compensation;Coping strategies training;Visual/perceptual remediation/compensation;Passive range of motion;Neuromuscular education;Electrical Stimulation;Cryotherapy;Manual Therapy;Patient/family education    Plan practice/address STG #4 mostly from w/c level, UBE   Consulted and Agree with Plan of Care Patient;Family member/caregiver    Family Member Consulted husband and daughter             Patient will benefit from skilled therapeutic intervention in order to improve the following deficits and impairments:   Body Structure / Function / Physical Skills: ADL, Strength, Dexterity, Balance, Tone, Body mechanics, Edema, Proprioception, UE functional use, IADL, ROM, Endurance, Mobility, Coordination, Sensation, FMC, Decreased knowledge of use of DME Cognitive Skills: Attention, Memory, Perception     Visit Diagnosis: Hemiplegia and hemiparesis following cerebral infarction affecting right dominant side (HCC)  Visuospatial deficit  Other lack  of coordination    Problem List Patient Active Problem List   Diagnosis Date Noted   Nausea without vomiting    Spastic hemiparesis (HCC)    History of hypertension    Neurogenic orthostatic hypotension (HCC)    Transaminitis    Right hemiplegia ([place])    Hemorrhagic stroke ([place])    Leucocytosis    Essential hypertension    Dyslipidemia    Right hemiparesis ([place])    ICH (intracerebral hemorrhage) ([place]) 12/01/2020   HYPERLIPIDEMIA 07/06/2009   OBESITY 07/06/2009    Carey Bullocks, OTR/L 03/24/2021, 10:33 AM  [facility] [address] [place], [place], [address] Phone: [phone]   Fax:  [phone]  Name: Felicia Chen MRN: [id number] Date of Birth: 1966-01-17

## 2021-03-24 NOTE — Therapy (Signed)
[facility] [address] [place], [place], [address] Phone: [phone]   Fax:  [phone]  Speech Language Pathology Treatment  Patient Details  Name: Felicia Chen MRN: [id number] Date of Birth: 04/06/1965 Referring Provider (SLP): Frann Rider NP   Encounter Date: 03/24/2021   End of Session - 03/24/21 0834     Visit Number 6    Number of Visits 25    Date for SLP Re-Evaluation 05/11/21    Authorization Type UHC, 40 per discipline per notes    Authorization - Visit Number 6    Authorization - Number of Visits 52    SLP Start Time 0845    SLP Stop Time  0930    SLP Time Calculation (min) 45 min    Activity Tolerance Patient tolerated treatment well             Past Medical History:  Diagnosis Date   Anxiety    Gilbert's syndrome     Past Surgical History:  Procedure Laterality Date   ABDOMINAL HYSTERECTOMY N/A 06/11/2012   Procedure: HYSTERECTOMY ABDOMINAL;  Surgeon: Allena Katz, MD;  Location: [facility];  Service: Gynecology;  Laterality: N/A;   CESAREAN SECTION     x 4   CYSTOSCOPY N/A 06/11/2012   Procedure: CYSTOSCOPY;  Surgeon: Allena Katz, MD;  Location: [facility];  Service: Gynecology;  Laterality: N/A;   HERNIA REPAIR     LAPAROSCOPIC ASSISTED VAGINAL HYSTERECTOMY N/A 06/11/2012   Procedure: LAPAROSCOPIC ASSISTED VAGINAL HYSTERECTOMY atttempted;  Surgeon: Allena Katz, MD;  Location: [facility];  Service: Gynecology;  Laterality: N/A;  Attempted Laparoscopic assisted vaginal hysterectomy.   LAPAROSCOPIC LYSIS OF ADHESIONS N/A 06/11/2012   Procedure: LAPAROSCOPIC LYSIS OF ADHESIONS;  Surgeon: Allena Katz, MD;  Location: [facility];  Service: Gynecology;  Laterality: N/A;   SALPINGOOPHORECTOMY Bilateral 06/11/2012   Procedure: SALPINGO OOPHORECTOMY;  Surgeon: Allena Katz, MD;  Location: [facility];  Service: Gynecology;  Laterality: Bilateral;    There were no vitals filed  for this visit.   Subjective Assessment - 03/24/21 0846     Subjective "I got one of the apps"    Currently in Pain? No/denies                   ADULT SLP TREATMENT - 03/24/21 0833       General Information   Behavior/Cognition Alert;Cooperative;Pleasant mood      Treatment Provided   Treatment provided Cognitive-Linquistic      Cognitive-Linquistic Treatment   Treatment focused on Cognition;Patient/family/caregiver education    Skilled Treatment Pt returned with HEP binder. SLP assessed recent targeted tasks with rare errors exhibited. Pt able to correct errors with rare min A. 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 Good recall and attention exhibited in explanation of document to SLP. Overall improving functional cognitive skills reported but pt believes she is not at yet at baseline. SLP will continue to monitor cognitive skills and need to possibly reduce frequency.      Assessment / Recommendations / Plan   Plan Continue with current plan of care      Progression Toward Goals   Progression toward goals Progressing toward goals              SLP Education - 03/24/21 1305     Education Details return to management of previous household tasks with supervision and A from family  Person(s) Educated Patient    Methods Explanation;Demonstration;Handout    Comprehension Verbalized understanding;Tactile cues required;Returned demonstration              SLP Short Term Goals - 03/24/21 0834       SLP SHORT TERM GOAL #1   Title Pt will be independent in mediation management with no missed meds over 1 week    Status Achieved      SLP SHORT TERM GOAL #2   Title Pt will carryover 2 compensations for attention to complete 2 IADL's over 1 week with rare min A    Time 1    Period Weeks    Status Achieved      SLP SHORT TERM GOAL #3   Title Pt will carryover compensations for attention, processing and sequencing to  complete light meal prep    Time 1    Period Weeks    Status On-going      SLP SHORT TERM GOAL #4   Title Pt will keep agenda or calendar to manage her daily appointments and to do list with rare min A from family    Status Achieved              SLP Long Term Goals - 03/24/21 0834       SLP LONG TERM GOAL #1   Title Pt will carryover 3 compensations for attention and error awareness to pay 3 bills with rare min A from family    Time 9    Period Weeks    Status On-going      SLP LONG TERM GOAL #2   Title Pt will ID and self correct 80% of errors on cognitive linguistic tasks with rare min A    Baseline 03-24-21    Time 9    Period Weeks    Status On-going      SLP LONG TERM GOAL #3   Title Pt will complete 3 executive function tasks with 90% accuracy and rare min A    Baseline 03-24-21    Time 9    Period Weeks    Status On-going              Plan - 03/24/21 0834     Clinical Impression Statement Felicia Chen is referred for outpt ST due to cognitive impairments s/p L hemorrhagic CVA. 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 At this time, pt is now managing her medications and appointments with success. Pt is beginning to establish systems to return to financial management of household and business. I recommend skilled ST to maximize cognition for return to PLOF, for safety and independence.    Speech Therapy Frequency 2x / week    Duration 12 weeks    Treatment/Interventions Environmental controls;Cueing hierarchy;SLP instruction and feedback;Compensatory strategies;Functional tasks;Cognitive reorganization;Compensatory techniques;Internal/external aids;Multimodal communcation approach;Patient/family education    Potential to Achieve Goals Good             Patient will benefit from skilled therapeutic intervention in order to improve the following deficits and impairments:   Cognitive communication  deficit    Problem List Patient Active Problem List   Diagnosis Date Noted   Nausea without vomiting    Spastic hemiparesis (HCC)    History of hypertension    Neurogenic orthostatic hypotension (HCC)    Transaminitis    Right hemiplegia (HCC)    Hemorrhagic stroke (HCC)    Leucocytosis    Essential hypertension  Dyslipidemia    Right hemiparesis (HCC)    ICH (intracerebral hemorrhage) ([place]) 12/01/2020   HYPERLIPIDEMIA 07/06/2009   OBESITY 07/06/2009    Alinda Deem, CCC-SLP 03/24/2021, 1:08 PM  [facility] [address] [place], [place], [address] Phone: [phone]   Fax:  [phone]   Name: SHAUNIECE KWAN MRN: [id number] Date of Birth: [birth year]

## 2021-03-24 NOTE — Therapy (Addendum)
[facility] [address] [place], [place], [address] Phone: [phone]   Fax:  [phone]  Physical Therapy Treatment  Patient Details  Name: Felicia Chen MRN: [id number] Date of Birth: [birth year] Referring Provider (PT): Dr. Dagoberto Ligas   Encounter Date: 03/24/2021   PT End of Session - 03/24/21 0935     Visit Number 8    Number of Visits 25    Date for PT Re-Evaluation 05/17/21    Authorization Type UHC -40 visit limit per discipline?    PT Start Time [phone]    PT Stop Time 1016    PT Time Calculation (min) 45 min    Equipment Utilized During Treatment Gait belt    Activity Tolerance Patient tolerated treatment well    Behavior During Therapy WFL for tasks assessed/performed             Past Medical History:  Diagnosis Date   Anxiety    Gilbert's syndrome     Past Surgical History:  Procedure Laterality Date   ABDOMINAL HYSTERECTOMY N/A 06/11/2012   Procedure: HYSTERECTOMY ABDOMINAL;  Surgeon: Allena Katz, MD;  Location: [facility];  Service: Gynecology;  Laterality: N/A;   CESAREAN SECTION     x 4   CYSTOSCOPY N/A 06/11/2012   Procedure: CYSTOSCOPY;  Surgeon: Allena Katz, MD;  Location: [facility];  Service: Gynecology;  Laterality: N/A;   HERNIA REPAIR     LAPAROSCOPIC ASSISTED VAGINAL HYSTERECTOMY N/A 06/11/2012   Procedure: LAPAROSCOPIC ASSISTED VAGINAL HYSTERECTOMY atttempted;  Surgeon: Allena Katz, MD;  Location: [facility];  Service: Gynecology;  Laterality: N/A;  Attempted Laparoscopic assisted vaginal hysterectomy.   LAPAROSCOPIC LYSIS OF ADHESIONS N/A 06/11/2012   Procedure: LAPAROSCOPIC LYSIS OF ADHESIONS;  Surgeon: Allena Katz, MD;  Location: [facility];  Service: Gynecology;  Laterality: N/A;   SALPINGOOPHORECTOMY Bilateral 06/11/2012   Procedure: SALPINGO OOPHORECTOMY;  Surgeon: Allena Katz, MD;  Location: [facility];  Service: Gynecology;  Laterality: Bilateral;    There  were no vitals filed for this visit.   Subjective Assessment - 03/24/21 0935     Subjective Had a fall the other day, landed on her dog. Was standing up and stepping forwards with her LLE and not holding on to RW and was by herself.    Patient is accompained by: Family member   daughther [name], sister [name]   Pertinent History anxiety, Gilbert's syndrome, obesity, HLD.    Limitations Walking;House hold activities;Standing    How long can you walk comfortably? 10 minutes    Patient Stated Goals wants to get back to normal.    Currently in Pain? No/denies    Pain Onset More than a month ago                               [facility] Adult PT Treatment/Exercise - 03/24/21 0956       Transfers   Transfers Sit to Stand;Stand to Sit    Sit to Stand 4: Min guard    Sit to Stand Details Verbal cues for sequencing;Verbal cues for technique;Tactile cues for weight shifting    Stand to Sit 5: Supervision    Stand Pivot Transfers 5: Supervision    Stand Pivot Transfer Details (indicate cue type and reason) Cues to turn fully with RW before sitting down    Comments Performed throughout session, about 10 reps, with focus on  scooting towards edge, wider BOS and incr forward lean for incr weight shift/bearing through RLE. Before sitting down, manual/verbal cues to maintain weight shift to RLE      Ambulation/Gait   Ambulation/Gait Yes    Ambulation/Gait Assistance 4: Min guard;4: Min assist    Ambulation/Gait Assistance Details Continued to use R  ottobock PLS AFO and trialed use of blue shoe cover as a simulated toe cap. Discussed that this is something that pt can potentially have added to her shoe through [place]. PT providing tactile cues and min A at pt's pelvis for proper hip alignment and cues for RLE hip flexion to help clear leg. Pt initially with improvement in placement/alignment of RLE, but when more fatigued noted RLE demonstrates more ER from the hip.    Ambulation Distance  (Feet) 115 Feet    Assistive device Rolling walker    Gait Pattern Step-through pattern;Decreased stance time - right;Decreased step length - left;Decreased hip/knee flexion - right;Decreased dorsiflexion - right;Decreased weight shift to right;Poor foot clearance - right;Right foot flat;Wide base of support;Abducted- right    Ambulation Surface Level;Indoor      Neuro Re-ed    Neuro Re-ed Details  With mirror in front of pt as visual cue for weight shifting, performed at edge of mat with RW: with 2" block under LLE for incr weight shift to RLE - 2 x 10 reps mini squats - with manual/tactile cues for proper weight shift and cues for technique, standing weight shifting 2 x 10 reps from midline <> R with verbal/tactile cues for weight shift performed with UE support > none with 2 episodes of min A for balance, SLS taps with LLE to 6" step for incr RLE weight shift 2 x 10 reps, cues for RLE knee control                 Balance Exercises - 03/24/21 1311       Balance Exercises: Standing   Step Ups Forward;4 inch;UE support 2;Limitations   2 x 10   Step Ups Limitations In // bars, with RLE staying on step, cues for knee control/weight shift and initial assist for neutral RLE alignment                  PT Short Term Goals - 03/24/21 1313       PT SHORT TERM GOAL #1   Title Pt will be independent with initial HEP in with family supervision in order to build upon functional gains made in therapy. ALL STGS DUE 03/17/21    Baseline family not present today to review HEP, but pt reports that performing consistently at home, will benefit from family review in future session    Time 4    Period Weeks    Status Deferred    Target Date 03/17/21      PT SHORT TERM GOAL #2   Title Pt will undergo further assessment of BERG with LTG written as appropriate.    Baseline 36/56 initial assessment    Time 4    Period Weeks    Status Achieved      PT SHORT TERM GOAL #3   Title Pt will  improve gait speed to at least 1.25 ft/sec in order to demo improved household mobility.    Baseline .90 ft/sec with RW; 1.96 ft/sec    Time 4    Period Weeks    Status Achieved      PT SHORT TERM GOAL #4   Title  Pt will ambulate at least 42' with RW and supervision in order to demo improved household mobility.    Baseline 230 w/ RW and CGA/MinA    Time 4    Period Weeks    Status Partially Met      PT SHORT TERM GOAL #5   Title Pt will perform 5x sit <> stand with UE support in 21 seconds or less with supervision in order to demo decr fall risk.    Baseline 23.69 seconds with min guard; 19.59 secs with single UE support    Time 4    Period Weeks    Status Achieved               PT Long Term Goals - 02/17/21 1205       PT LONG TERM GOAL #1   Title Pt will be independent with final HEP in with family supervision in order to build upon functional gains made in therapy. ALL LTGS DUE 04/14/21    Time 8    Period Weeks    Status New    Target Date 04/14/21      PT LONG TERM GOAL #2   Title BERG goal written to demo decr fall risk.    Time 8    Period Weeks    Status New      PT LONG TERM GOAL #3   Title Pt will ambulate at least 300' outdoors with RW and supervision in order to demo improved community mobility.    Time 8    Period Weeks    Status New      PT LONG TERM GOAL #4   Title Pt will go up and down 12 stairs with single handrail with supervision/min guard with step to pattern in order to safely go up/down the stairs in her house.    Baseline not yet assessed.    Time 8    Period Weeks    Status New      PT LONG TERM GOAL #5   Title Pt will perform 5x sit <> stand with UE support in 16 seconds or less with supervision in order to demo decr fall risk.    Baseline 23.69 seconds with min guard    Time 8    Period Weeks    Status New      Additional Long Term Goals   Additional Long Term Goals Yes      PT LONG TERM GOAL #6   Title Pt will improve gait  speed to at least 1.9 ft/sec in order to demo improved household mobility and decr fall risk.    Baseline .90 ft/sec with RW    Time 8    Period Weeks    Status New                   Plan - 03/24/21 1312     Clinical Impression Statement Today's skilled session focused on NMR to RLE for incr weight bearing/strengthening and gait training with continued use of R ottobock PLS AFO. Used a Geologist, engineering for visual cues in standing for incr weight shift to RLE, with tactile cues also provided. Continued to use tactile cues/min A during gait for proper alignment of RLE and to decr ER from hip, pt with improvement initially, but does have incr ER when leg gets more fatigued. Will continue to progress towards LTGs.    Personal Factors and Comorbidities Comorbidity 3+;Past/Current Experience;Time since onset of injury/illness/exacerbation    Comorbidities anxiety,  Gilbert's syndrome, obesity, HLD    Examination-Activity Limitations Bathing;Bend;Caring for Others;Toileting;Stand;Stairs;Squat;Reach Overhead;Locomotion Level;Transfers    Examination-Participation Restrictions Cleaning;Community Activity;Driving;Medication Management    Stability/Clinical Decision Making Evolving/Moderate complexity    Rehab Potential Good    PT Frequency 2x / week    PT Duration 12 weeks    PT Treatment/Interventions ADLs/Self Care Home Management;Electrical Stimulation;DME Instruction;Gait training;Stair training;Functional mobility training;Therapeutic activities;Aquatic Therapy;Therapeutic exercise;Balance training;Neuromuscular re-education;Manual techniques;Orthotic Fit/Training;Patient/family education;Passive range of motion;Energy conservation;Vestibular;Visual/perceptual remediation/compensation    PT Next Visit Plan continue sit to stands with equal/RLE weight bearing. tall kneeling would be good to try. RLE NMR and strengthening. Gait training with RW - ottobock PLS AFO, Continue to practice stairs. maybe  continue Bioness (with steering electrodes) for strengthening/gait and try hamstring cuff?    PT Home Exercise Plan [id number]    Consulted and Agree with Plan of Care Patient;Family member/caregiver             Patient will benefit from skilled therapeutic intervention in order to improve the following deficits and impairments:  Abnormal gait, Decreased activity tolerance, Decreased balance, Decreased cognition, Decreased endurance, Decreased coordination, Decreased range of motion, Decreased safety awareness, Decreased strength, Hypomobility, Increased edema, Impaired sensation, Postural dysfunction, Impaired flexibility, Impaired tone, Difficulty walking, Impaired UE functional use, Impaired vision/preception  Visit Diagnosis: Hemiplegia and hemiparesis following cerebral infarction affecting right dominant side (HCC)  Other lack of coordination  Other symptoms and signs involving cognitive functions following cerebral infarction  Muscle weakness (generalized)  Unsteadiness on feet     Problem List Patient Active Problem List   Diagnosis Date Noted   Nausea without vomiting    Spastic hemiparesis ([place])    History of hypertension    Neurogenic orthostatic hypotension (HCC)    Transaminitis    Right hemiplegia ([place])    Hemorrhagic stroke ([place])    Leucocytosis    Essential hypertension    Dyslipidemia    Right hemiparesis ([place])    ICH (intracerebral hemorrhage) ([place]) 12/01/2020   HYPERLIPIDEMIA 07/06/2009   OBESITY 07/06/2009    Arliss Journey, PT, DPT  03/24/2021, 1:16 PM  [facility] [address] [place], [place], [address] Phone: [phone]   Fax:  [phone]  Name: Felicia Chen MRN: [id number] Date of Birth: 12-15-1965

## 2021-03-28 ENCOUNTER — Encounter: Payer: Self-pay | Admitting: Speech Pathology

## 2021-03-28 ENCOUNTER — Ambulatory Visit: Payer: 59 | Admitting: Speech Pathology

## 2021-03-28 ENCOUNTER — Other Ambulatory Visit: Payer: Self-pay

## 2021-03-28 ENCOUNTER — Ambulatory Visit: Payer: 59 | Admitting: Occupational Therapy

## 2021-03-28 ENCOUNTER — Ambulatory Visit: Payer: 59

## 2021-03-28 VITALS — BP 119/79 | HR 103

## 2021-03-28 DIAGNOSIS — I69351 Hemiplegia and hemiparesis following cerebral infarction affecting right dominant side: Secondary | ICD-10-CM

## 2021-03-28 DIAGNOSIS — R2689 Other abnormalities of gait and mobility: Secondary | ICD-10-CM

## 2021-03-28 DIAGNOSIS — M6281 Muscle weakness (generalized): Secondary | ICD-10-CM

## 2021-03-28 DIAGNOSIS — R278 Other lack of coordination: Secondary | ICD-10-CM

## 2021-03-28 DIAGNOSIS — R2681 Unsteadiness on feet: Secondary | ICD-10-CM

## 2021-03-28 DIAGNOSIS — R41841 Cognitive communication deficit: Secondary | ICD-10-CM

## 2021-03-28 NOTE — Patient Instructions (Addendum)
°  Consider speech to text for your journals  Play your stroke card when you want to - you have earned it  Prioritize - don't do what causes stress - it will suck your energy after a stroke  [employer] podcast and on IG  [employer]  [place]  Keep energy conservation in mind - if you  have an event at night, rest during the day. Try not to schedule 2 busy days in a row  Do harder, longer tasks earlier in the day  You look really good - some people will forget you had a stroke and

## 2021-03-28 NOTE — Therapy (Signed)
[facility] [address] [place], [place], [address] Phone: [phone]   Fax:  [phone]  Physical Therapy Treatment  Patient Details  Name: Felicia Chen MRN: [id number] Date of Birth: 08-19-1965 Referring Provider (PT): Dr. Dagoberto Ligas   Encounter Date: 03/28/2021   PT End of Session - 03/28/21 0930     Visit Number 9    Number of Visits 25    Date for PT Re-Evaluation 05/17/21    Authorization Type UHC -40 visit limit per discipline?    PT Start Time [phone]   patient finishing with Speech Therapy   PT Stop Time 1014    PT Time Calculation (min) 38 min    Equipment Utilized During Treatment Gait belt    Activity Tolerance Patient tolerated treatment well    Behavior During Therapy WFL for tasks assessed/performed             Past Medical History:  Diagnosis Date   Anxiety    Gilbert's syndrome     Past Surgical History:  Procedure Laterality Date   ABDOMINAL HYSTERECTOMY N/A 06/11/2012   Procedure: HYSTERECTOMY ABDOMINAL;  Surgeon: Allena Katz, MD;  Location: [facility];  Service: Gynecology;  Laterality: N/A;   CESAREAN SECTION     x 4   CYSTOSCOPY N/A 06/11/2012   Procedure: CYSTOSCOPY;  Surgeon: Allena Katz, MD;  Location: [facility];  Service: Gynecology;  Laterality: N/A;   HERNIA REPAIR     LAPAROSCOPIC ASSISTED VAGINAL HYSTERECTOMY N/A 06/11/2012   Procedure: LAPAROSCOPIC ASSISTED VAGINAL HYSTERECTOMY atttempted;  Surgeon: Allena Katz, MD;  Location: [facility];  Service: Gynecology;  Laterality: N/A;  Attempted Laparoscopic assisted vaginal hysterectomy.   LAPAROSCOPIC LYSIS OF ADHESIONS N/A 06/11/2012   Procedure: LAPAROSCOPIC LYSIS OF ADHESIONS;  Surgeon: Allena Katz, MD;  Location: [facility];  Service: Gynecology;  Laterality: N/A;   SALPINGOOPHORECTOMY Bilateral 06/11/2012   Procedure: SALPINGO OOPHORECTOMY;  Surgeon: Allena Katz, MD;  Location: [facility];  Service:  Gynecology;  Laterality: Bilateral;    Vitals:   03/28/21 0942 03/28/21 0946 03/28/21 1010  BP: (!) 121/98 110/82 119/79  Pulse: 93 93 (!) 103     Subjective Assessment - 03/28/21 0930     Subjective No new changes/complaints. No falls to report. Reports no more episodes regarding the lightheadedness.    Patient is accompained by: Family member   daughther [name], sister [name]   Pertinent History anxiety, Gilbert's syndrome, obesity, HLD.    Limitations Walking;House hold activities;Standing    How long can you walk comfortably? 10 minutes    Patient Stated Goals wants to get back to normal.    Currently in Pain? Yes    Pain Score 3     Pain Location Hand    Pain Orientation Right    Pain Descriptors / Indicators Tightness    Pain Type Acute pain    Pain Onset More than a month ago               [facility] Adult PT Treatment/Exercise - 03/28/21 0001       Transfers   Transfers Sit to Stand;Stand to Sit    Sit to Stand 4: Min guard    Sit to Stand Details Verbal cues for sequencing;Verbal cues for technique;Tactile cues for weight shifting    Stand to Sit 5: Supervision      Self-Care   Self-Care Other Self-Care Comments    Other Self-Care Comments  Due to elevated BP reading at start of session, engaged patient in deep breathing 3 minutes with improvements noted in BP readings.      Neuro Re-ed    Neuro Re-ed Details  transitioned patient from standing to tall kneeling with CGA, obtained tall kneeling position on mat with bench in front for UE support. With BUE weight bearing completed hip hinges x 10 reps, with PT providing tactile cues at pelvis for increased weight shift onto RLE, working on retun to upright with maintaining hip extension. In upright position, working on maintaining hip extension and core engagement to maintain tall kneeling position without UE supprt, completed 23 x 10-20 seconds. CGA and intermittnet UE support required.Then with single UE support completed  lateral weight shift to L and reaching with RUE across midline to cone x 10 reps, then PT transition and had patient shift to R and reach with LUE to increase RLE weight bearing to cone x 10 reps, more challenge noted with reaching to R side. To get out of tall kneeling position, with increaed apraxia noted trying to move into seated to L side. Patient took a step back with RLE, despite cues from PT to lead with strong leg and as patient began to shift weight onto RLE. Patient began to experience buckling in RLE and unable to stand therefore PT transitioned to safely lowering patient to floor Mod A. then transitioned to working on floor <> stand transfers. Patient demo increased anxiety requiring rest breaks in tall kneeling position and deep breathing. Due to apraxia and weakness, patient unable to coordinate placing LLE onto floor to push up with BUE and LLE, therefore require Max A from PT and PT Tech. BP WNL after this activity, but patient having increased anxiety noted.                       PT Short Term Goals - 03/24/21 1313       PT SHORT TERM GOAL #1   Title Pt will be independent with initial HEP in with family supervision in order to build upon functional gains made in therapy. ALL STGS DUE 03/17/21    Baseline family not present today to review HEP, but pt reports that performing consistently at home, will benefit from family review in future session    Time 4    Period Weeks    Status Deferred    Target Date 03/17/21      PT SHORT TERM GOAL #2   Title Pt will undergo further assessment of BERG with LTG written as appropriate.    Baseline 36/56 initial assessment    Time 4    Period Weeks    Status Achieved      PT SHORT TERM GOAL #3   Title Pt will improve gait speed to at least 1.25 ft/sec in order to demo improved household mobility.    Baseline .90 ft/sec with RW; 1.96 ft/sec    Time 4    Period Weeks    Status Achieved      PT SHORT TERM GOAL #4   Title Pt  will ambulate at least 64' with RW and supervision in order to demo improved household mobility.    Baseline 230 w/ RW and CGA/MinA    Time 4    Period Weeks    Status Partially Met      PT SHORT TERM GOAL #5   Title Pt will perform 5x sit <> stand with UE support in  21 seconds or less with supervision in order to demo decr fall risk.    Baseline 23.69 seconds with min guard; 19.59 secs with single UE support    Time 4    Period Weeks    Status Achieved               PT Long Term Goals - 02/17/21 1205       PT LONG TERM GOAL #1   Title Pt will be independent with final HEP in with family supervision in order to build upon functional gains made in therapy. ALL LTGS DUE 04/14/21    Time 8    Period Weeks    Status New    Target Date 04/14/21      PT LONG TERM GOAL #2   Title BERG goal written to demo decr fall risk.    Time 8    Period Weeks    Status New      PT LONG TERM GOAL #3   Title Pt will ambulate at least 300' outdoors with RW and supervision in order to demo improved community mobility.    Time 8    Period Weeks    Status New      PT LONG TERM GOAL #4   Title Pt will go up and down 12 stairs with single handrail with supervision/min guard with step to pattern in order to safely go up/down the stairs in her house.    Baseline not yet assessed.    Time 8    Period Weeks    Status New      PT LONG TERM GOAL #5   Title Pt will perform 5x sit <> stand with UE support in 16 seconds or less with supervision in order to demo decr fall risk.    Baseline 23.69 seconds with min guard    Time 8    Period Weeks    Status New      Additional Long Term Goals   Additional Long Term Goals Yes      PT LONG TERM GOAL #6   Title Pt will improve gait speed to at least 1.9 ft/sec in order to demo improved household mobility and decr fall risk.    Baseline .90 ft/sec with RW    Time 8    Period Weeks    Status New                   Plan - 03/28/21 0944      Clinical Impression Statement Diastolic BP elevated at start of PT session, patient asymptomatic. Engaged patient in deep breathing/relaxation to help reduce BP reading with improvements noted. Completed tall kneeling activites with patient toleraitng well. Patient demo significant challenge transitioning out of tall kneeling position requiring gentle lowering to the ground Mod A by PT. Transitioned to practining floor > stand transfers with patietn require Max A. Increased anxiety noted today with actiities, requiring PT encourgagment.    Personal Factors and Comorbidities Comorbidity 3+;Past/Current Experience;Time since onset of injury/illness/exacerbation    Comorbidities anxiety, Gilbert's syndrome, obesity, HLD    Examination-Activity Limitations Bathing;Bend;Caring for Others;Toileting;Stand;Stairs;Squat;Reach Overhead;Locomotion Level;Transfers    Examination-Participation Restrictions Cleaning;Community Activity;Driving;Medication Management    Stability/Clinical Decision Making Evolving/Moderate complexity    Rehab Potential Good    PT Frequency 2x / week    PT Duration 12 weeks    PT Treatment/Interventions ADLs/Self Care Home Management;Electrical Stimulation;DME Instruction;Gait training;Stair training;Functional mobility training;Therapeutic activities;Aquatic Therapy;Therapeutic exercise;Balance training;Neuromuscular re-education;Manual techniques;Orthotic Fit/Training;Patient/family education;Passive range  of motion;Energy conservation;Vestibular;Visual/perceptual remediation/compensation    PT Next Visit Plan continue sit to stands with equal/RLE weight bearing. tall kneeling activities, go down on side. RLE NMR and strengthening. Gait training with RW - ottobock PLS AFO, Continue to practice stairs. maybe continue Bioness (with steering electrodes) for strengthening/gait and try hamstring cuff?    PT Home Exercise Plan [id number]    Consulted and Agree with Plan of Care  Patient;Family member/caregiver             Patient will benefit from skilled therapeutic intervention in order to improve the following deficits and impairments:  Abnormal gait, Decreased activity tolerance, Decreased balance, Decreased cognition, Decreased endurance, Decreased coordination, Decreased range of motion, Decreased safety awareness, Decreased strength, Hypomobility, Increased edema, Impaired sensation, Postural dysfunction, Impaired flexibility, Impaired tone, Difficulty walking, Impaired UE functional use, Impaired vision/preception  Visit Diagnosis: Muscle weakness (generalized)  Unsteadiness on feet  Other abnormalities of gait and mobility  Hemiplegia and hemiparesis following cerebral infarction affecting right dominant side [facility])     Problem List Patient Active Problem List   Diagnosis Date Noted   Nausea without vomiting    Spastic hemiparesis (HCC)    History of hypertension    Neurogenic orthostatic hypotension (HCC)    Transaminitis    Right hemiplegia ([place])    Hemorrhagic stroke (HCC)    Leucocytosis    Essential hypertension    Dyslipidemia    Right hemiparesis (HCC)    ICH (intracerebral hemorrhage) ([place]) 12/01/2020   HYPERLIPIDEMIA 07/06/2009   OBESITY 07/06/2009    Jones Bales, PT, DPT 03/28/2021, 12:23 PM  [facility] [address] [place], [place], [address] Phone: [phone]   Fax:  [phone]  Name: Felicia Chen MRN: [id number] Date of Birth: 1966/02/18

## 2021-03-28 NOTE — Therapy (Signed)
[facility] [address] [place], [place], [address] Phone: [phone]   Fax:  [phone]  Occupational Therapy Treatment  Patient Details  Name: Felicia Chen MRN: [id number] Date of Birth: [birth year] Referring Provider (OT): Dr. Courtney Heys   Encounter Date: 03/28/2021   OT End of Session - 03/28/21 1210     Visit Number 10    Number of Visits 25    Date for OT Re-Evaluation 05/17/21    Authorization Type UHC: VL 40 per discipline, no auth required    OT Start Time 1015    OT Stop Time 1100    OT Time Calculation (min) 45 min    Activity Tolerance Patient tolerated treatment well    Behavior During Therapy [facility] for tasks assessed/performed             Past Medical History:  Diagnosis Date   Anxiety    Gilbert's syndrome     Past Surgical History:  Procedure Laterality Date   ABDOMINAL HYSTERECTOMY N/A 06/11/2012   Procedure: HYSTERECTOMY ABDOMINAL;  Surgeon: Allena Katz, MD;  Location: [facility];  Service: Gynecology;  Laterality: N/A;   CESAREAN SECTION     x 4   CYSTOSCOPY N/A 06/11/2012   Procedure: CYSTOSCOPY;  Surgeon: Allena Katz, MD;  Location: [facility];  Service: Gynecology;  Laterality: N/A;   HERNIA REPAIR     LAPAROSCOPIC ASSISTED VAGINAL HYSTERECTOMY N/A 06/11/2012   Procedure: LAPAROSCOPIC ASSISTED VAGINAL HYSTERECTOMY atttempted;  Surgeon: Allena Katz, MD;  Location: [facility];  Service: Gynecology;  Laterality: N/A;  Attempted Laparoscopic assisted vaginal hysterectomy.   LAPAROSCOPIC LYSIS OF ADHESIONS N/A 06/11/2012   Procedure: LAPAROSCOPIC LYSIS OF ADHESIONS;  Surgeon: Allena Katz, MD;  Location: [facility];  Service: Gynecology;  Laterality: N/A;   SALPINGOOPHORECTOMY Bilateral 06/11/2012   Procedure: SALPINGO OOPHORECTOMY;  Surgeon: Allena Katz, MD;  Location: [facility];  Service: Gynecology;  Laterality: Bilateral;    There were no vitals filed for this  visit.   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 PMH: HLD, anxiety, covid-19, Gilbert's syndrome    Patient Stated Goals get back to normal    Currently in Pain? No/denies             Pt practiced getting items out of refrigerator from w/c level w/ cues to problem solve and use Rt foot to help move w/c.  Pt then used w/c to move closer to counter and stand at counter to retrieve items from higher shelf w/ cues for safety, planning ahead, and foot placement, as well as CGA using gait belt. Pt practiced gathering all ingredients for making [employer] sandwich either from w/c level or standing and sidestepping at counter. Pt overall did well w/ cues and min assist.  Pt practiced side stepping along counter to Rt and Lt in prep for eventually doing snack/meal prep from standing level - noted instability and decreased proprioception Rt foot/ankle  Practiced propelling w/c w/ LE's around gym w/ cues to use Rt foot  Pt placing small pegs in pegboard Rt hand then taking out 3 at a time for fingertip to/from palm translation and in hand manipulation with  mod difficulty. Rotating dii and block around in fingertips and rotating card clockwise and counterclockwise for in hand manipulation                       OT Short Term Goals - 03/24/21 0929       OT SHORT TERM GOAL #1   Title Independent with HEP for RUE shoulder ROM, hand coordination and hand strength    Time 4    Period Weeks    Status Achieved    Target Date 03/16/21      OT SHORT TERM GOAL #2   Title Pt to perform all BADLS with no more than min assist    Time 4    Period Weeks    Status Achieved      OT SHORT TERM GOAL #3   Title Pt to write name and  address with 50% or greater legibility    Time 4    Period Weeks    Status Achieved   90% legibility w/ extra time     OT SHORT TERM GOAL #4   Title Pt to prepare simple snack, sandwich, and microwaveable items consistently using DME prn with distant supervision only    Time 4    Period Weeks    Status New      OT SHORT TERM GOAL #5   Title Pt to improve coordination as evidenced by performing 9 hole peg test Rt hand in under 1 min. and 15 sec    Baseline 1 min. 45 sec    Time 4    Period Weeks    Status Achieved   52.87 sec     OT SHORT TERM GOAL #6   Title Pt to grip strength Rt hand to 30 lbs or greater in prep for opening jars    Baseline 22 lbs (Lt = 65 lbs)    Time 4    Period Weeks    Status On-going   24.4 lbs              OT Long Term Goals - 02/16/21 1034       OT LONG TERM GOAL #1   Title Independent with strengthening HEP for Rt shoulder    Time 12    Period Weeks    Status New      OT LONG TERM GOAL #2   Title Pt to fully attend to Rt side and return to using Rt hand as dominant hand 90% of the time safely for ADLS w/ task modifications prn    Time 12    Period Weeks    Status New      OT LONG TERM GOAL #3   Title Independent with all BADLS    Time 12    Period Weeks    Status New      OT LONG TERM GOAL #4   Title Pt to perform simple cooking tasks with supervision/min cueing only    Time 12    Period Weeks    Status New      OT LONG TERM GOAL #5   Title Improve coordination Rt hand as evidenced by performing 9 hole peg test in 45 sec or less    Baseline 1 min. 45 sec.    Time 12    Period Weeks    Status New      Long Term Additional Goals   Additional Long Term Goals Yes      OT LONG  TERM GOAL #6   Title Pt to improve grip strength Rt hand to 40 lbs or greater to open jars/containers    Baseline 22 lbs (Lt = 65 lbs)    Time 12    Period Weeks    Status New      OT LONG TERM GOAL #7   Title Pt to write name at 90% legibility  and 3 sentences at 75% or greater legibility    Time 12    Period Weeks    Status New                   Plan - 03/28/21 1210     Clinical Impression Statement Pt w/ decreased proprioception Rt side and noted apraxia w/ certain tasks. Pt however w/ improved awareness to Rt side w/ today's activities    OT Occupational Profile and History Detailed Assessment- Review of Records and additional review of physical, cognitive, psychosocial history related to current functional performance    Occupational performance deficits (Please refer to evaluation for details): ADL's;IADL's;Work;Leisure    Body Structure / Function / Physical Skills ADL;Strength;Dexterity;Balance;Tone;Body mechanics;Edema;Proprioception;UE functional use;IADL;ROM;Endurance;Mobility;Coordination;Sensation;FMC;Decreased knowledge of use of DME    Cognitive Skills Attention;Memory;Perception    Rehab Potential Good    Clinical Decision Making Several treatment options, min-mod task modification necessary    Comorbidities Affecting Occupational Performance: May have comorbidities impacting occupational performance    Modification or Assistance to Complete Evaluation  No modification of tasks or assist necessary to complete eval    OT Frequency 2x / week    OT Duration 12 weeks   PLUS EVAL   OT Treatment/Interventions Self-care/ADL training;DME and/or AE instruction;Moist Heat;Therapeutic activities;Compression bandaging;Splinting;Therapeutic exercise;Ultrasound;Cognitive remediation/compensation;Coping strategies training;Visual/perceptual remediation/compensation;Passive range of motion;Neuromuscular education;Electrical Stimulation;Cryotherapy;Manual Therapy;Patient/family education    Plan continue to reinforce STG #4 mostly from w/c level, UBE, in hand manipulation    Consulted and Agree with Plan of Care Patient;Family member/caregiver    Family Member Consulted husband and daughter             Patient will  benefit from skilled therapeutic intervention in order to improve the following deficits and impairments:   Body Structure / Function / Physical Skills: ADL, Strength, Dexterity, Balance, Tone, Body mechanics, Edema, Proprioception, UE functional use, IADL, ROM, Endurance, Mobility, Coordination, Sensation, FMC, Decreased knowledge of use of DME Cognitive Skills: Attention, Memory, Perception     Visit Diagnosis: Hemiplegia and hemiparesis following cerebral infarction affecting right dominant side (HCC)  Unsteadiness on feet  Other lack of coordination    Problem List Patient Active Problem List   Diagnosis Date Noted   Nausea without vomiting    Spastic hemiparesis (HCC)    History of hypertension    Neurogenic orthostatic hypotension (HCC)    Transaminitis    Right hemiplegia ([place])    Hemorrhagic stroke ([place])    Leucocytosis    Essential hypertension    Dyslipidemia    Right hemiparesis ([place])    ICH (intracerebral hemorrhage) ([place]) 12/01/2020   HYPERLIPIDEMIA 07/06/2009   OBESITY 07/06/2009    Carey Bullocks, OTR/L 03/28/2021, 12:13 PM  [facility] [address] [place], [place], [address] Phone: [phone]   Fax:  [phone]  Name: Felicia Chen MRN: [id number] Date of Birth: [birth year]

## 2021-03-28 NOTE — Therapy (Signed)
[facility] [address] [place], [place], [address] Phone: [phone]   Fax:  [phone]  Speech Language Pathology Treatment  Patient Details  Name: Felicia Chen MRN: [id number] Date of Birth: 08-09-1965 Referring Provider (SLP): Frann Rider NP   Encounter Date: 03/28/2021   End of Session - 03/28/21 1214     Visit Number 7    Number of Visits 25    Date for SLP Re-Evaluation 05/11/21    Authorization Type UHC, 40 per discipline per notes    Authorization - Visit Number 7    Authorization - Number of Visits 6    SLP Start Time 0845    SLP Stop Time  0930    SLP Time Calculation (min) 45 min    Activity Tolerance Patient tolerated treatment well             Past Medical History:  Diagnosis Date   Anxiety    Gilbert's syndrome     Past Surgical History:  Procedure Laterality Date   ABDOMINAL HYSTERECTOMY N/A 06/11/2012   Procedure: HYSTERECTOMY ABDOMINAL;  Surgeon: Allena Katz, MD;  Location: [facility];  Service: Gynecology;  Laterality: N/A;   CESAREAN SECTION     x 4   CYSTOSCOPY N/A 06/11/2012   Procedure: CYSTOSCOPY;  Surgeon: Allena Katz, MD;  Location: [facility];  Service: Gynecology;  Laterality: N/A;   HERNIA REPAIR     LAPAROSCOPIC ASSISTED VAGINAL HYSTERECTOMY N/A 06/11/2012   Procedure: LAPAROSCOPIC ASSISTED VAGINAL HYSTERECTOMY atttempted;  Surgeon: Allena Katz, MD;  Location: [facility];  Service: Gynecology;  Laterality: N/A;  Attempted Laparoscopic assisted vaginal hysterectomy.   LAPAROSCOPIC LYSIS OF ADHESIONS N/A 06/11/2012   Procedure: LAPAROSCOPIC LYSIS OF ADHESIONS;  Surgeon: Allena Katz, MD;  Location: [facility];  Service: Gynecology;  Laterality: N/A;   SALPINGOOPHORECTOMY Bilateral 06/11/2012   Procedure: SALPINGO OOPHORECTOMY;  Surgeon: Allena Katz, MD;  Location: [facility];  Service: Gynecology;  Laterality: Bilateral;    There were no vitals filed  for this visit.   Subjective Assessment - 03/28/21 0853     Patient is accompained by: Family member   daugher, Felicia Chen   Currently in Pain? Yes    Pain Score 3     Pain Location Hand    Pain Orientation Right    Pain Descriptors / Indicators Tightness    Pain Type Acute pain    Pain Onset More than a month ago                   ADULT SLP TREATMENT - 03/28/21 0854       General Information   Behavior/Cognition Alert;Cooperative;Pleasant mood      Treatment Provided   Treatment provided Cognitive-Linquistic      Cognitive-Linquistic Treatment   Treatment focused on Cognition;Patient/family/caregiver education    Skilled Treatment Pt completed HEP and reported it was hard. Moderately complex mental math and reasoning completed with extended time and occasional min A 6/8 problems. 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 She is completing light cooking and is meal planning successfully. Instructed her on energy conservation.      Assessment / Recommendations / Plan   Plan Continue with current plan of care      Progression Toward Goals   Progression toward goals Progressing toward goals  SLP Education - 03/28/21 1212     Education Details stroke survivor podcasts, energy conservation    Person(s) Educated Patient;Child(ren)    Methods Explanation;Demonstration;Verbal cues;Handout    Comprehension Verbalized understanding;Verbal cues required;Need further instruction              SLP Short Term Goals - 03/28/21 1213       SLP SHORT TERM GOAL #1   Title Pt will be independent in mediation management with no missed meds over 1 week    Status Achieved      SLP SHORT TERM GOAL #2   Title Pt will carryover 2 compensations for attention to complete 2 IADL's over 1 week with rare min A    Time 1    Period Weeks    Status Achieved      SLP SHORT TERM GOAL #3   Title Pt will carryover  compensations for attention, processing and sequencing to complete light meal prep    Time 1    Period Weeks    Status Achieved      SLP SHORT TERM GOAL #4   Title Pt will keep agenda or calendar to manage her daily appointments and to do list with rare min A from family    Status Achieved              SLP Long Term Goals - 03/28/21 [place] #1   Title Pt will carryover 3 compensations for attention and error awareness to pay 3 bills with rare min A from family    Time 8    Period Weeks    Status On-going      SLP LONG TERM GOAL #2   Title Pt will ID and self correct 80% of errors on cognitive linguistic tasks with rare min A    Baseline 03-24-21; 1-9/23    Time 8    Period Weeks    Status On-going      SLP LONG TERM GOAL #3   Title Pt will complete 3 executive function tasks with 90% accuracy and rare min A    Baseline 03-24-21    Time 8    Period Weeks    Status On-going              Plan - 03/28/21 1213     Clinical Impression Statement Felicia Chen is referred for outpt ST due to cognitive impairments s/p L hemorrhagic CVA. 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 At this time, pt is now managing her medications and appointments with success. Pt is beginning to establish systems to return to financial management of household and business. I recommend skilled ST to maximize cognition for return to PLOF, for safety and independence.    Speech Therapy Frequency 2x / week    Duration 12 weeks    Treatment/Interventions Environmental controls;Cueing hierarchy;SLP instruction and feedback;Compensatory strategies;Functional tasks;Cognitive reorganization;Compensatory techniques;Internal/external aids;Multimodal communcation approach;Patient/family education    Potential to Achieve Goals Good             Patient will benefit from skilled therapeutic intervention in order to improve the  following deficits and impairments:   Cognitive communication deficit    Problem List Patient Active Problem List   Diagnosis Date Noted   Nausea without vomiting    Spastic hemiparesis (HCC)    History of hypertension    Neurogenic orthostatic hypotension (HCC)  Transaminitis    Right hemiplegia (HCC)    Hemorrhagic stroke (HCC)    Leucocytosis    Essential hypertension    Dyslipidemia    Right hemiparesis (HCC)    ICH (intracerebral hemorrhage) ([place]) 12/01/2020   HYPERLIPIDEMIA 07/06/2009   OBESITY 07/06/2009    Felicia Chen, Annye Rusk, CCC-SLP 03/28/2021, 12:15 PM  [facility] [address] [place], [place], [address] Phone: [phone]   Fax:  [phone]   Name: Felicia Chen MRN: [id number] Date of Birth: Jul 23, 1965

## 2021-03-30 ENCOUNTER — Telehealth: Payer: Self-pay | Admitting: Adult Health

## 2021-03-30 ENCOUNTER — Encounter: Payer: 59 | Admitting: Speech Pathology

## 2021-03-30 ENCOUNTER — Encounter: Payer: 59 | Admitting: Occupational Therapy

## 2021-03-30 ENCOUNTER — Ambulatory Visit: Payer: 59 | Admitting: Physical Therapy

## 2021-03-30 NOTE — Telephone Encounter (Signed)
She is limited to the more basic antidepressant/antianxiety medications especially as we should avoid SSRI/SNRI. I am not sure why she needs to see neuropsych though as she was struggling with depression/anxiety prior to stroke - not necessarily because from her stroke. She should be able to be referred to regular psychiatry for medication management/recommendations. I would advise her to reach back out to PCP for this referral. Thank you

## 2021-03-30 NOTE — Telephone Encounter (Signed)
Called patient and reviewed NP's recommendations with her. Patient verbalized understanding, appreciation.

## 2021-03-30 NOTE — Telephone Encounter (Signed)
Pt called states the buPROPion (WELLBUTRIN XL) 150 MG 24 hr tablet is not working. Pt requesting a call back.

## 2021-03-30 NOTE — Telephone Encounter (Signed)
Called patient who stated wellbutrin is not helping her anxiety. I reviewed NP's last note, advised she discuss with PCP. She saw her PCP yesterday who told patient she had no other medication options to suggest since she can't take SSRI, SNRIs. PCP referred her to a neuropsycologist and told patient to stop taking wellbutrin. She's asking what other suggestions NP can give her until she sees neuropsychologist because it will be a while before she can get in. I advised will discuss with NP and let her know. Patient verbalized understanding, appreciation.

## 2021-03-31 ENCOUNTER — Ambulatory Visit: Payer: 59 | Admitting: Occupational Therapy

## 2021-03-31 ENCOUNTER — Other Ambulatory Visit: Payer: Self-pay

## 2021-03-31 ENCOUNTER — Ambulatory Visit: Payer: 59

## 2021-03-31 ENCOUNTER — Ambulatory Visit: Payer: 59 | Admitting: Physical Therapy

## 2021-03-31 ENCOUNTER — Encounter: Payer: Self-pay | Admitting: Physical Therapy

## 2021-03-31 VITALS — BP 106/82 | HR 105

## 2021-03-31 DIAGNOSIS — I69351 Hemiplegia and hemiparesis following cerebral infarction affecting right dominant side: Secondary | ICD-10-CM

## 2021-03-31 DIAGNOSIS — R278 Other lack of coordination: Secondary | ICD-10-CM

## 2021-03-31 DIAGNOSIS — R2681 Unsteadiness on feet: Secondary | ICD-10-CM

## 2021-03-31 DIAGNOSIS — M6281 Muscle weakness (generalized): Secondary | ICD-10-CM

## 2021-03-31 DIAGNOSIS — R2689 Other abnormalities of gait and mobility: Secondary | ICD-10-CM

## 2021-03-31 DIAGNOSIS — R41841 Cognitive communication deficit: Secondary | ICD-10-CM

## 2021-03-31 NOTE — Therapy (Signed)
[facility] [address] [place], [place], [address] Phone: [phone]   Fax:  [phone]  Physical Therapy Treatment  Patient Details  Name: Felicia Chen MRN: [id number] Date of Birth: Mar 22, 1965 Referring Provider (PT): Dr. Dagoberto Ligas   Encounter Date: 03/31/2021   PT End of Session - 03/31/21 0940     Visit Number 10    Number of Visits 25    Date for PT Re-Evaluation 05/17/21    Authorization Type UHC -40 visit limit per discipline?    PT Start Time [phone]    PT Stop Time 1015    PT Time Calculation (min) 42 min    Equipment Utilized During Treatment Gait belt    Activity Tolerance Patient tolerated treatment well    Behavior During Therapy WFL for tasks assessed/performed             Past Medical History:  Diagnosis Date   Anxiety    Gilbert's syndrome     Past Surgical History:  Procedure Laterality Date   ABDOMINAL HYSTERECTOMY N/A 06/11/2012   Procedure: HYSTERECTOMY ABDOMINAL;  Surgeon: Allena Katz, MD;  Location: [facility];  Service: Gynecology;  Laterality: N/A;   CESAREAN SECTION     x 4   CYSTOSCOPY N/A 06/11/2012   Procedure: CYSTOSCOPY;  Surgeon: Allena Katz, MD;  Location: [facility];  Service: Gynecology;  Laterality: N/A;   HERNIA REPAIR     LAPAROSCOPIC ASSISTED VAGINAL HYSTERECTOMY N/A 06/11/2012   Procedure: LAPAROSCOPIC ASSISTED VAGINAL HYSTERECTOMY atttempted;  Surgeon: Allena Katz, MD;  Location: [facility];  Service: Gynecology;  Laterality: N/A;  Attempted Laparoscopic assisted vaginal hysterectomy.   LAPAROSCOPIC LYSIS OF ADHESIONS N/A 06/11/2012   Procedure: LAPAROSCOPIC LYSIS OF ADHESIONS;  Surgeon: Allena Katz, MD;  Location: [facility];  Service: Gynecology;  Laterality: N/A;   SALPINGOOPHORECTOMY Bilateral 06/11/2012   Procedure: SALPINGO OOPHORECTOMY;  Surgeon: Allena Katz, MD;  Location: [facility];  Service: Gynecology;  Laterality: Bilateral;     Vitals:   03/31/21 0944 03/31/21 1010  BP: 111/81 106/82  Pulse: 96 (!) 105     Subjective Assessment - 03/31/21 0934     Subjective Has been dealing with a lot of anxiety. Not able to take her SSRIs medication from before and does not feel like Welbutrin is helping. Neurologist is looking into a neuropsych evaluation. No falls. Has been sore after Monday's session. Went to [employer] and got fitted for a brace - got custom molded for her AFO and should be here in 4 weeks.    Patient is accompained by: Family member   [name], [name]   Pertinent History anxiety, Gilbert's syndrome, obesity, HLD.    Limitations Walking;House hold activities;Standing    How long can you walk comfortably? 10 minutes    Patient Stated Goals wants to get back to normal.    Currently in Pain? No/denies    Pain Onset More than a month ago                               [facility] Adult PT Treatment/Exercise - 03/31/21 1235       Transfers   Transfers Sit to Stand;Stand to Sit    Sit to Stand 4: Min guard    Sit to Stand Details Verbal cues for sequencing;Verbal cues for technique;Tactile cues for weight shifting    Sit to Stand  Details (indicate cue type and reason) Pt able to initiate proper technique and helping use LLE to position RLE, pt with improvement with RLE weight bearing at end of session    Stand to Sit 5: Supervision    Stand Pivot Transfers 5: Supervision    Stand Pivot Transfer Details (indicate cue type and reason) Cues to turn fully with RW before sitting down      Neuro Re-ed    Neuro Re-ed Details  With mirror in front of pt for visual feedback: sit to stands from elevated mat table with 2" block under LLE for incr weight shift to RLE, cued for incr forward weight shift and in standing use of mirror as visual cue for midline positioning and incr weight shift to R, pt did well with cue to bump R hip to therapist, otherwise when cued to go to R pt with tendency  for R lateral trunk lean, performed x5 reps with no UE support when sitting with additional manual cues to maintain weight shift to R      Knee/Hip Exercises: Supine   Heel Slides Strengthening;AROM;1 set;10 reps;Right    Heel Slides Limitations With pillow case under RLE on sliding board, therapist needing to help keep heel on board for hamstring activation, pt with decr proprioception and unable to tell when heel stays on board and tendency to perform with hip flexion    Bridges with Diona Foley Squeeze 10 reps;Limitations   Cues to squeeze yoga block first, therapist helping to keep RLE in proper position, cues for breathing. Verbal/tactile cues for incr RLE activation   Other Supine Knee/Hip Exercises hip ADD hookyling yoga block squeezes x10 reps with 3 second holds, with RLE extended x10 reps hip IR (from externally rotated position > midline/slightly past), with red physioball under RLE x10 reps heel digs and knee/hip flexion with therapist helping steady RLE and keep in midline position - pt did better with ball as pt able to see when heel is presed down into ball for hamsting activation. 2 x 10 reps off mat table for hip extension/flexion - extending RLE off mat table and lifting back on with hip flexion, cued to keep knee bent and therapist assisting with positioning.                       PT Short Term Goals - 03/24/21 1313       PT SHORT TERM GOAL #1   Title Pt will be independent with initial HEP in with family supervision in order to build upon functional gains made in therapy. ALL STGS DUE 03/17/21    Baseline family not present today to review HEP, but pt reports that performing consistently at home, will benefit from family review in future session    Time 4    Period Weeks    Status Deferred    Target Date 03/17/21      PT SHORT TERM GOAL #2   Title Pt will undergo further assessment of BERG with LTG written as appropriate.    Baseline 36/56 initial assessment    Time  4    Period Weeks    Status Achieved      PT SHORT TERM GOAL #3   Title Pt will improve gait speed to at least 1.25 ft/sec in order to demo improved household mobility.    Baseline .90 ft/sec with RW; 1.96 ft/sec    Time 4    Period Weeks    Status Achieved  PT SHORT TERM GOAL #4   Title Pt will ambulate at least 230' with RW and supervision in order to demo improved household mobility.    Baseline 230 w/ RW and CGA/MinA    Time 4    Period Weeks    Status Partially Met      PT SHORT TERM GOAL #5   Title Pt will perform 5x sit <> stand with UE support in 21 seconds or less with supervision in order to demo decr fall risk.    Baseline 23.69 seconds with min guard; 19.59 secs with single UE support    Time 4    Period Weeks    Status Achieved               PT Long Term Goals - 02/17/21 1205       PT LONG TERM GOAL #1   Title Pt will be independent with final HEP in with family supervision in order to build upon functional gains made in therapy. ALL LTGS DUE 04/14/21    Time 8    Period Weeks    Status New    Target Date 04/14/21      PT LONG TERM GOAL #2   Title BERG goal written to demo decr fall risk.    Time 8    Period Weeks    Status New      PT LONG TERM GOAL #3   Title Pt will ambulate at least 300' outdoors with RW and supervision in order to demo improved community mobility.    Time 8    Period Weeks    Status New      PT LONG TERM GOAL #4   Title Pt will go up and down 12 stairs with single handrail with supervision/min guard with step to pattern in order to safely go up/down the stairs in her house.    Baseline not yet assessed.    Time 8    Period Weeks    Status New      PT LONG TERM GOAL #5   Title Pt will perform 5x sit <> stand with UE support in 16 seconds or less with supervision in order to demo decr fall risk.    Baseline 23.69 seconds with min guard    Time 8    Period Weeks    Status New      Additional Long Term Goals    Additional Long Term Goals Yes      PT LONG TERM GOAL #6   Title Pt will improve gait speed to at least 1.9 ft/sec in order to demo improved household mobility and decr fall risk.    Baseline .90 ft/sec with RW    Time 8    Period Weeks    Status New                   Plan - 03/31/21 1248     Clinical Impression Statement Pt's BP WFL throughout session today. Today's session focused on RLE strengthening and NMR to incr RLE weight bearing in standing and sit <> stands. With supine exercises, pt unable to tell where RLE placement is during heel slides on sliding board due to impaired proprioception. Pt with improvement when able to visualize where RLE is in space. Will continue to progress towards LTGs.    Personal Factors and Comorbidities Comorbidity 3+;Past/Current Experience;Time since onset of injury/illness/exacerbation    Comorbidities anxiety, Gilbert's syndrome, obesity, HLD    Examination-Activity Limitations  Bathing;Bend;Caring for Others;Toileting;Stand;Stairs;Squat;Reach Overhead;Locomotion Level;Transfers    Examination-Participation Restrictions Cleaning;Community Activity;Driving;Medication Management    Stability/Clinical Decision Making Evolving/Moderate complexity    Rehab Potential Good    PT Frequency 2x / week    PT Duration 12 weeks    PT Treatment/Interventions ADLs/Self Care Home Management;Electrical Stimulation;DME Instruction;Gait training;Stair training;Functional mobility training;Therapeutic activities;Aquatic Therapy;Therapeutic exercise;Balance training;Neuromuscular re-education;Manual techniques;Orthotic Fit/Training;Patient/family education;Passive range of motion;Energy conservation;Vestibular;Visual/perceptual remediation/compensation    PT Next Visit Plan continue sit to stands with equal/RLE weight bearing. tall kneeling activities, go down on side. RLE NMR and strengthening. Gait training with RW - ottobock PLS AFO, Continue to practice stairs.  maybe continue Bioness (with steering electrodes) for strengthening/gait and try hamstring cuff?    PT Home Exercise Plan [id number]    Consulted and Agree with Plan of Care Patient;Family member/caregiver             Patient will benefit from skilled therapeutic intervention in order to improve the following deficits and impairments:  Abnormal gait, Decreased activity tolerance, Decreased balance, Decreased cognition, Decreased endurance, Decreased coordination, Decreased range of motion, Decreased safety awareness, Decreased strength, Hypomobility, Increased edema, Impaired sensation, Postural dysfunction, Impaired flexibility, Impaired tone, Difficulty walking, Impaired UE functional use, Impaired vision/preception  Visit Diagnosis: Hemiplegia and hemiparesis following cerebral infarction affecting right dominant side (HCC)  Unsteadiness on feet  Muscle weakness (generalized)  Other abnormalities of gait and mobility     Problem List Patient Active Problem List   Diagnosis Date Noted   Nausea without vomiting    Spastic hemiparesis (HCC)    History of hypertension    Neurogenic orthostatic hypotension (HCC)    Transaminitis    Right hemiplegia ([facility])    Hemorrhagic stroke (HCC)    Leucocytosis    Essential hypertension    Dyslipidemia    Right hemiparesis (HCC)    ICH (intracerebral hemorrhage) ([place]) 12/01/2020   HYPERLIPIDEMIA 07/06/2009   OBESITY 07/06/2009    Arliss Journey, PT, DPT  03/31/2021, 12:55 PM  [facility] [address] [place], [place], [address] Phone: [phone]   Fax:  [phone]  Name: Felicia Chen MRN: [id number] Date of Birth: 1965-05-24

## 2021-03-31 NOTE — Therapy (Signed)
[facility] [address] [place], [place], [address] Phone: [phone]   Fax:  [phone]  Occupational Therapy Treatment  Patient Details  Name: Felicia Chen MRN: [id number] Date of Birth: 1965-05-13 Referring Provider (OT): Dr. Courtney Heys   Encounter Date: 03/31/2021   OT End of Session - 03/31/21 1058     Visit Number 11    Number of Visits 25    Date for OT Re-Evaluation 05/17/21    Authorization Type UHC: VL 40 per discipline, no auth required    OT Start Time 1015    OT Stop Time 1100    OT Time Calculation (min) 45 min    Activity Tolerance Patient tolerated treatment well    Behavior During Therapy [facility] for tasks assessed/performed             Past Medical History:  Diagnosis Date   Anxiety    Gilbert's syndrome     Past Surgical History:  Procedure Laterality Date   ABDOMINAL HYSTERECTOMY N/A 06/11/2012   Procedure: HYSTERECTOMY ABDOMINAL;  Surgeon: Allena Katz, MD;  Location: [facility];  Service: Gynecology;  Laterality: N/A;   CESAREAN SECTION     x 4   CYSTOSCOPY N/A 06/11/2012   Procedure: CYSTOSCOPY;  Surgeon: Allena Katz, MD;  Location: [facility];  Service: Gynecology;  Laterality: N/A;   HERNIA REPAIR     LAPAROSCOPIC ASSISTED VAGINAL HYSTERECTOMY N/A 06/11/2012   Procedure: LAPAROSCOPIC ASSISTED VAGINAL HYSTERECTOMY atttempted;  Surgeon: Allena Katz, MD;  Location: [facility];  Service: Gynecology;  Laterality: N/A;  Attempted Laparoscopic assisted vaginal hysterectomy.   LAPAROSCOPIC LYSIS OF ADHESIONS N/A 06/11/2012   Procedure: LAPAROSCOPIC LYSIS OF ADHESIONS;  Surgeon: Allena Katz, MD;  Location: [facility];  Service: Gynecology;  Laterality: N/A;   SALPINGOOPHORECTOMY Bilateral 06/11/2012   Procedure: SALPINGO OOPHORECTOMY;  Surgeon: Allena Katz, MD;  Location: [facility];  Service: Gynecology;  Laterality: Bilateral;    There were no vitals filed for this  visit.   Subjective Assessment - 03/31/21 1020     Subjective  No pain, just stiffness    Patient is accompanied by: Family member    Pertinent History ICH 12/01/20 Lt frontal/parietal lobes w/ SAH extending over bilat cerebral hemispheres and cerebellum, Lt temporal lobe ischemic CVA w/ residual Rt hemiplegia. PMH: HLD, anxiety, covid-19, Gilbert's syndrome    Patient Stated Goals get back to normal    Currently in Pain? No/denies             Kitchen unavailable today therefore worked on dynamic standing and side stepping along counter in gym area. Pt requires CGA using gait belt to stand at counter, side step to Rt/Lt along counter in prep for kitchen tasks. Pt also practiced getting items out of lower and higher cabinets in standing w/ one hand counter support - same assist from therapist.   UBE x 6 min, level 3 for reciprocal movement pattern   Worked on in hand manipulation at table: rotating small ball in fingertips, manipulating coins, turning dii over w/ 3 first fingers                       OT Short Term Goals - 03/24/21 0929       OT SHORT TERM GOAL #1   Title Independent with HEP for RUE shoulder ROM, hand coordination and hand strength    Time 4  Period Weeks    Status Achieved    Target Date 03/16/21      OT SHORT TERM GOAL #2   Title Pt to perform all BADLS with no more than min assist    Time 4    Period Weeks    Status Achieved      OT SHORT TERM GOAL #3   Title Pt to write name and address with 50% or greater legibility    Time 4    Period Weeks    Status Achieved   90% legibility w/ extra time     OT SHORT TERM GOAL #4   Title Pt to prepare simple snack, sandwich, and microwaveable items consistently using DME prn with distant supervision only    Time 4    Period Weeks    Status New      OT SHORT TERM GOAL #5   Title Pt to improve coordination as evidenced by performing 9 hole peg test Rt hand in under 1 min. and 15 sec     Baseline 1 min. 45 sec    Time 4    Period Weeks    Status Achieved   52.87 sec     OT SHORT TERM GOAL #6   Title Pt to grip strength Rt hand to 30 lbs or greater in prep for opening jars    Baseline 22 lbs (Lt = 65 lbs)    Time 4    Period Weeks    Status On-going   24.4 lbs              OT Long Term Goals - 02/16/21 1034       OT LONG TERM GOAL #1   Title Independent with strengthening HEP for Rt shoulder    Time 12    Period Weeks    Status New      OT LONG TERM GOAL #2   Title Pt to fully attend to Rt side and return to using Rt hand as dominant hand 90% of the time safely for ADLS w/ task modifications prn    Time 12    Period Weeks    Status New      OT LONG TERM GOAL #3   Title Independent with all BADLS    Time 12    Period Weeks    Status New      OT LONG TERM GOAL #4   Title Pt to perform simple cooking tasks with supervision/min cueing only    Time 12    Period Weeks    Status New      OT LONG TERM GOAL #5   Title Improve coordination Rt hand as evidenced by performing 9 hole peg test in 45 sec or less    Baseline 1 min. 45 sec.    Time 12    Period Weeks    Status New      Long Term Additional Goals   Additional Long Term Goals Yes      OT LONG TERM GOAL #6   Title Pt to improve grip strength Rt hand to 40 lbs or greater to open jars/containers    Baseline 22 lbs (Lt = 65 lbs)    Time 12    Period Weeks    Status New      OT LONG TERM GOAL #7   Title Pt to write name at 90% legibility and 3 sentences at 75% or greater legibility    Time 12  Period Weeks    Status New                   Plan - 03/31/21 1059     Clinical Impression Statement Pt progressing with fine motor tasks including in hand manipulation. Pt with increased awareness to RT side w/ dynamic standing tasks. Anxiety limiting factor    OT Occupational Profile and History Detailed Assessment- Review of Records and additional review of physical, cognitive,  psychosocial history related to current functional performance    Occupational performance deficits (Please refer to evaluation for details): ADL's;IADL's;Work;Leisure    Body Structure / Function / Physical Skills ADL;Strength;Dexterity;Balance;Tone;Body mechanics;Edema;Proprioception;UE functional use;IADL;ROM;Endurance;Mobility;Coordination;Sensation;FMC;Decreased knowledge of use of DME    Cognitive Skills Attention;Memory;Perception    Rehab Potential Good    Clinical Decision Making Several treatment options, min-mod task modification necessary    Comorbidities Affecting Occupational Performance: May have comorbidities impacting occupational performance    Modification or Assistance to Complete Evaluation  No modification of tasks or assist necessary to complete eval    OT Frequency 2x / week    OT Duration 12 weeks   PLUS EVAL   OT Treatment/Interventions Self-care/ADL training;DME and/or AE instruction;Moist Heat;Therapeutic activities;Compression bandaging;Splinting;Therapeutic exercise;Ultrasound;Cognitive remediation/compensation;Coping strategies training;Visual/perceptual remediation/compensation;Passive range of motion;Neuromuscular education;Electrical Stimulation;Cryotherapy;Manual Therapy;Patient/family education    Plan continue to reinforce STG #4 mostly from w/c level, RUE reaching, UBE    Consulted and Agree with Plan of Care Patient;Family member/caregiver    Family Member Consulted husband and daughter             Patient will benefit from skilled therapeutic intervention in order to improve the following deficits and impairments:   Body Structure / Function / Physical Skills: ADL, Strength, Dexterity, Balance, Tone, Body mechanics, Edema, Proprioception, UE functional use, IADL, ROM, Endurance, Mobility, Coordination, Sensation, FMC, Decreased knowledge of use of DME Cognitive Skills: Attention, Memory, Perception     Visit Diagnosis: Hemiplegia and hemiparesis  following cerebral infarction affecting right dominant side (HCC)  Other lack of coordination  Unsteadiness on feet    Problem List Patient Active Problem List   Diagnosis Date Noted   Nausea without vomiting    Spastic hemiparesis (HCC)    History of hypertension    Neurogenic orthostatic hypotension (HCC)    Transaminitis    Right hemiplegia ([place])    Hemorrhagic stroke ([place])    Leucocytosis    Essential hypertension    Dyslipidemia    Right hemiparesis ([place])    ICH (intracerebral hemorrhage) ([place]) 12/01/2020   HYPERLIPIDEMIA 07/06/2009   OBESITY 07/06/2009    Carey Bullocks, OTR/L 03/31/2021, 1:31 PM  [facility] [address] [place], [place], [address] Phone: [phone]   Fax:  [phone]  Name: Felicia Chen MRN: [id number] Date of Birth: June 29, 1965

## 2021-03-31 NOTE — Patient Instructions (Signed)
Maxine Glenn, [facility] at [facility]. [address] [place], [place] [address] [facility] [place],  [address]

## 2021-03-31 NOTE — Therapy (Signed)
[facility] [address] [place], [place], [address] Phone: [phone]   Fax:  [phone]  Speech Language Pathology Treatment  Patient Details  Name: Felicia Chen MRN: [id number] Date of Birth: [birth year] Referring Provider (SLP): Frann Rider NP   Encounter Date: 03/31/2021   End of Session - 03/31/21 0848     Visit Number 8    Number of Visits 25    Date for SLP Re-Evaluation 05/11/21    Authorization Type UHC, 40 per discipline per notes    Authorization - Visit Number 8    Authorization - Number of Visits 40    SLP Start Time 0848    SLP Stop Time  0930    SLP Time Calculation (min) 42 min    Activity Tolerance Patient tolerated treatment well;Other (comment)   anxious            Past Medical History:  Diagnosis Date   Anxiety    Gilbert's syndrome     Past Surgical History:  Procedure Laterality Date   ABDOMINAL HYSTERECTOMY N/A 06/11/2012   Procedure: HYSTERECTOMY ABDOMINAL;  Surgeon: Allena Katz, MD;  Location: [name];  Service: Gynecology;  Laterality: N/A;   CESAREAN SECTION     x 4   CYSTOSCOPY N/A 06/11/2012   Procedure: CYSTOSCOPY;  Surgeon: Allena Katz, MD;  Location: [facility];  Service: Gynecology;  Laterality: N/A;   HERNIA REPAIR     LAPAROSCOPIC ASSISTED VAGINAL HYSTERECTOMY N/A 06/11/2012   Procedure: LAPAROSCOPIC ASSISTED VAGINAL HYSTERECTOMY atttempted;  Surgeon: Allena Katz, MD;  Location: [facility];  Service: Gynecology;  Laterality: N/A;  Attempted Laparoscopic assisted vaginal hysterectomy.   LAPAROSCOPIC LYSIS OF ADHESIONS N/A 06/11/2012   Procedure: LAPAROSCOPIC LYSIS OF ADHESIONS;  Surgeon: Allena Katz, MD;  Location: [facility];  Service: Gynecology;  Laterality: N/A;   SALPINGOOPHORECTOMY Bilateral 06/11/2012   Procedure: SALPINGO OOPHORECTOMY;  Surgeon: Allena Katz, MD;  Location: [facility];  Service: Gynecology;  Laterality: Bilateral;     There were no vitals filed for this visit.   Subjective Assessment - 03/31/21 0848     Subjective "she gave me some podcasts"    Patient is accompained by: Family member   sister   Currently in Pain? No/denies                   ADULT SLP TREATMENT - 03/31/21 0848       General Information   Behavior/Cognition Alert;Cooperative;Pleasant mood;Other (comment)   heightened anxiety     Treatment Provided   Treatment provided Cognitive-Linquistic      Cognitive-Linquistic Treatment   Treatment focused on Cognition;Patient/family/caregiver education    Skilled Treatment Pt's sister present and provided insight into pt's current challenges with anxiety/depression. Medical team is trying to determine best solution for medicine management. Emotional lability exhibited today related to medication management and feelings of heightened anxiety. Pt reports she benefits from redirection and family support. SLP reviewed HEP, in which pt completed with 100% accuracy given mod A x1 from daughter as pt misread instructions. For functional math tasks targeted today, pt noted to missed certain words while reading impacting her performance. SLP educated patient to read instructions twice at least to optimize attention and ensure comprehension prior to calculating. SLP provided min A x2 to reread instructions. Pt endorsed increased pressure with SLP present and often requested SLP affirmation. Pt would benefit from management of anxiety as this is a  contributing factor in performance on cognitive tasks.      Assessment / Recommendations / Plan   Plan Continue with current plan of care      Progression Toward Goals   Progression toward goals Progressing toward goals              SLP Education - 03/31/21 0938     Education Details re-read instructions to aid attention and comprehension, use finger to track words, slow rate    Person(s) Educated Patient;Other (comment)    Methods  Explanation;Demonstration;Handout;Verbal cues    Comprehension Verbalized understanding;Returned demonstration;Need further instruction              SLP Short Term Goals - 03/31/21 0927       SLP SHORT TERM GOAL #1   Title Pt will be independent in mediation management with no missed meds over 1 week    Status Achieved      SLP SHORT TERM GOAL #2   Title Pt will carryover 2 compensations for attention to complete 2 IADL's over 1 week with rare min A    Status Achieved      SLP SHORT TERM GOAL #3   Title Pt will carryover compensations for attention, processing and sequencing to complete light meal prep    Status Achieved      SLP SHORT TERM GOAL #4   Title Pt will keep agenda or calendar to manage her daily appointments and to do list with rare min A from family    Status Achieved              SLP Long Term Goals - 03/31/21 9179       SLP LONG TERM GOAL #1   Title Pt will carryover 3 compensations for attention and error awareness to pay 3 bills with rare min A from family    Time 8    Period Weeks    Status On-going      SLP LONG TERM GOAL #2   Title Pt will ID and self correct 80% of errors on cognitive linguistic tasks with rare min A    Baseline 03-24-21; 1-9/23    Time 8    Period Weeks    Status On-going      SLP LONG TERM GOAL #3   Title Pt will complete 3 executive function tasks with 90% accuracy and rare min A    Baseline 03-24-21    Time 8    Period Weeks    Status On-going              Plan - 03/31/21 1505     Clinical Impression Statement Felicia Chen is referred for outpt ST due to cognitive impairments s/p L hemorrhagic CVA. 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 At this time, pt is now managing her medications and appointments with success. Pt is beginning to establish systems to return to financial management of household and business. Heightened anxiety reported today which appears  to be impacting daily functioning, including cognitive performance. Pt benefits from cues to slow rate and reread instructions to aid attention due to feelings of increased internal/external pressure to perform. I recommend skilled ST to maximize cognition for return to PLOF, for safety and independence.    Speech Therapy Frequency 2x / week    Duration 12 weeks    Treatment/Interventions Environmental controls;Cueing hierarchy;SLP instruction and feedback;Compensatory strategies;Functional tasks;Cognitive reorganization;Compensatory techniques;Internal/external aids;Multimodal communcation approach;Patient/family education  Potential to Achieve Goals Good             Patient will benefit from skilled therapeutic intervention in order to improve the following deficits and impairments:   Cognitive communication deficit    Problem List Patient Active Problem List   Diagnosis Date Noted   Nausea without vomiting    Spastic hemiparesis ([place])    History of hypertension    Neurogenic orthostatic hypotension (HCC)    Transaminitis    Right hemiplegia (HCC)    Hemorrhagic stroke ([place])    Leucocytosis    Essential hypertension    Dyslipidemia    Right hemiparesis (HCC)    ICH (intracerebral hemorrhage) ([place]) 12/01/2020   HYPERLIPIDEMIA 07/06/2009   OBESITY 07/06/2009    Alinda Deem, [place] 03/31/2021, 9:42 AM  [facility] [address] [place], [place], [address] Phone: [phone]   Fax:  [phone]   Name: KRYSTA BLOOMFIELD MRN: [id number] Date of Birth: 1965-11-15

## 2021-04-01 ENCOUNTER — Encounter: Payer: Self-pay | Admitting: Psychology

## 2021-04-04 ENCOUNTER — Ambulatory Visit: Payer: 59 | Admitting: Occupational Therapy

## 2021-04-04 ENCOUNTER — Ambulatory Visit: Payer: 59

## 2021-04-04 ENCOUNTER — Ambulatory Visit: Payer: 59 | Admitting: Physical Therapy

## 2021-04-04 ENCOUNTER — Other Ambulatory Visit: Payer: Self-pay

## 2021-04-04 ENCOUNTER — Encounter: Payer: Self-pay | Admitting: Physical Therapy

## 2021-04-04 VITALS — BP 115/74 | HR 97

## 2021-04-04 DIAGNOSIS — R278 Other lack of coordination: Secondary | ICD-10-CM

## 2021-04-04 DIAGNOSIS — M6281 Muscle weakness (generalized): Secondary | ICD-10-CM

## 2021-04-04 DIAGNOSIS — R41841 Cognitive communication deficit: Secondary | ICD-10-CM

## 2021-04-04 DIAGNOSIS — R2681 Unsteadiness on feet: Secondary | ICD-10-CM

## 2021-04-04 DIAGNOSIS — I69351 Hemiplegia and hemiparesis following cerebral infarction affecting right dominant side: Secondary | ICD-10-CM

## 2021-04-04 DIAGNOSIS — R41842 Visuospatial deficit: Secondary | ICD-10-CM

## 2021-04-04 NOTE — Patient Instructions (Signed)
[employer] - reading app -read it aloud -there should be comprehension questions at the end

## 2021-04-04 NOTE — Therapy (Signed)
[facility] [address] [place], [place], [address] Phone: [phone]   Fax:  [phone]  Occupational Therapy Treatment  Patient Details  Name: Felicia Chen MRN: [id number] Date of Birth: 1965/11/03 Referring Provider (OT): Dr. Courtney Heys   Encounter Date: 04/04/2021   OT End of Session - 04/04/21 0850     Visit Number 12    Number of Visits 25    Date for OT Re-Evaluation 05/17/21    Authorization Type UHC: VL 40 per discipline, no auth required    OT Start Time 0848    OT Stop Time 0930    OT Time Calculation (min) 42 min    Activity Tolerance Patient tolerated treatment well    Behavior During Therapy [facility] for tasks assessed/performed             Past Medical History:  Diagnosis Date   Anxiety    Gilbert's syndrome     Past Surgical History:  Procedure Laterality Date   ABDOMINAL HYSTERECTOMY N/A 06/11/2012   Procedure: HYSTERECTOMY ABDOMINAL;  Surgeon: Allena Katz, MD;  Location: [facility];  Service: Gynecology;  Laterality: N/A;   CESAREAN SECTION     x 4   CYSTOSCOPY N/A 06/11/2012   Procedure: CYSTOSCOPY;  Surgeon: Allena Katz, MD;  Location: [facility];  Service: Gynecology;  Laterality: N/A;   HERNIA REPAIR     LAPAROSCOPIC ASSISTED VAGINAL HYSTERECTOMY N/A 06/11/2012   Procedure: LAPAROSCOPIC ASSISTED VAGINAL HYSTERECTOMY atttempted;  Surgeon: Allena Katz, MD;  Location: [facility];  Service: Gynecology;  Laterality: N/A;  Attempted Laparoscopic assisted vaginal hysterectomy.   LAPAROSCOPIC LYSIS OF ADHESIONS N/A 06/11/2012   Procedure: LAPAROSCOPIC LYSIS OF ADHESIONS;  Surgeon: Allena Katz, MD;  Location: [facility];  Service: Gynecology;  Laterality: N/A;   SALPINGOOPHORECTOMY Bilateral 06/11/2012   Procedure: SALPINGO OOPHORECTOMY;  Surgeon: Allena Katz, MD;  Location: [facility];  Service: Gynecology;  Laterality: Bilateral;    There were no vitals filed for this  visit.   Subjective Assessment - 04/04/21 0848     Subjective  Pt reports no changes and denies pain.    Patient is accompanied by: Family member   daughters   Pertinent History ICH 12/01/20 Lt frontal/parietal lobes w/ SAH extending over bilat cerebral hemispheres and cerebellum, Lt temporal lobe ischemic CVA w/ residual Rt hemiplegia. PMH: HLD, anxiety, covid-19, Gilbert's syndrome    Patient Stated Goals get back to normal    Currently in Pain? No/denies    Pain Score 0-No pain                          OT Treatments/Exercises (OP) - 04/04/21 0851       ADLs   Functional Mobility pt reporting not using the w/c around the house (leaves it in the car) and uses the walker around the house.    Cooking made PB sandwhich and worked on Counsellor around [employer] at w/c level. pt req'd close supervision d/t unsteadiness with standing at counter for reaching items and managing refrigerator. Pt did well with bimanual cooridnation with spreading peanut butter on bread.      Exercises   Exercises Work Hardening      Airline pilot Exercises hand gripper with level 2 with black spring with RUE. Pt with min difficulty and min drops      Work Landscape architect (  Upper Arm Bike) x 6 minutes for reciprocal movements and conditioning. Forward and backward      Fine Motor Coordination (Hand/Wrist)   Fine Motor Coordination Grooved pegs    Grooved pegs w  RUE with emphasis on using RUE only for in hand manipulation for rotating pegs.                      OT Short Term Goals - 04/04/21 0907       OT SHORT TERM GOAL #1   Title Independent with HEP for RUE shoulder ROM, hand coordination and hand strength    Time 4    Period Weeks    Status Achieved    Target Date 03/16/21      OT SHORT TERM GOAL #2   Title Pt to perform all BADLS with no more than min assist    Time 4    Period Weeks    Status Achieved      OT SHORT TERM GOAL #3   Title Pt  to write name and address with 50% or greater legibility    Time 4    Period Weeks    Status Achieved   90% legibility w/ extra time     OT SHORT TERM GOAL #4   Title Pt to prepare simple snack, sandwich, and microwaveable items consistently using DME prn with distant supervision only    Time 4    Period Weeks    Status On-going   pt req'd close supervision d/t unsteadiness with standing at counter for reaching items and managing refrigerator     OT SHORT TERM GOAL #5   Title Pt to improve coordination as evidenced by performing 9 hole peg test Rt hand in under 1 min. and 15 sec    Baseline 1 min. 45 sec    Time 4    Period Weeks    Status Achieved   52.87 sec     OT SHORT TERM GOAL #6   Title Pt to grip strength Rt hand to 30 lbs or greater in prep for opening jars    Baseline 22 lbs (Lt = 65 lbs)    Time 4    Period Weeks    Status On-going   24.4 lbs              OT Long Term Goals - 02/16/21 1034       OT LONG TERM GOAL #1   Title Independent with strengthening HEP for Rt shoulder    Time 12    Period Weeks    Status New      OT LONG TERM GOAL #2   Title Pt to fully attend to Rt side and return to using Rt hand as dominant hand 90% of the time safely for ADLS w/ task modifications prn    Time 12    Period Weeks    Status New      OT LONG TERM GOAL #3   Title Independent with all BADLS    Time 12    Period Weeks    Status New      OT LONG TERM GOAL #4   Title Pt to perform simple cooking tasks with supervision/min cueing only    Time 12    Period Weeks    Status New      OT LONG TERM GOAL #5   Title Improve coordination Rt hand as evidenced by performing 9 hole peg test in 45 sec  or less    Baseline 1 min. 45 sec.    Time 12    Period Weeks    Status New      Long Term Additional Goals   Additional Long Term Goals Yes      OT LONG TERM GOAL #6   Title Pt to improve grip strength Rt hand to 40 lbs or greater to open jars/containers    Baseline  22 lbs (Lt = 65 lbs)    Time 12    Period Weeks    Status New      OT LONG TERM GOAL #7   Title Pt to write name at 90% legibility and 3 sentences at 75% or greater legibility    Time 12    Period Weeks    Status New                   Plan - 04/04/21 0911     Clinical Impression Statement Pt encouraged to go to kitchen with assistance for fixing own snacks, beverages, etc.    OT Occupational Profile and History Detailed Assessment- Review of Records and additional review of physical, cognitive, psychosocial history related to current functional performance    Occupational performance deficits (Please refer to evaluation for details): ADL's;IADL's;Work;Leisure    Body Structure / Function / Physical Skills ADL;Strength;Dexterity;Balance;Tone;Body mechanics;Edema;Proprioception;UE functional use;IADL;ROM;Endurance;Mobility;Coordination;Sensation;FMC;Decreased knowledge of use of DME    Cognitive Skills Attention;Memory;Perception    Rehab Potential Good    Clinical Decision Making Several treatment options, min-mod task modification necessary    Comorbidities Affecting Occupational Performance: May have comorbidities impacting occupational performance    Modification or Assistance to Complete Evaluation  No modification of tasks or assist necessary to complete eval    OT Frequency 2x / week    OT Duration 12 weeks   PLUS EVAL   OT Treatment/Interventions Self-care/ADL training;DME and/or AE instruction;Moist Heat;Therapeutic activities;Compression bandaging;Splinting;Therapeutic exercise;Ultrasound;Cognitive remediation/compensation;Coping strategies training;Visual/perceptual remediation/compensation;Passive range of motion;Neuromuscular education;Electrical Stimulation;Cryotherapy;Manual Therapy;Patient/family education    Plan continue with reaching with RUE, grip strength and coordination, arm bike    Consulted and Agree with Plan of Care Patient;Family member/caregiver     Family Member Consulted husband and daughter             Patient will benefit from skilled therapeutic intervention in order to improve the following deficits and impairments:   Body Structure / Function / Physical Skills: ADL, Strength, Dexterity, Balance, Tone, Body mechanics, Edema, Proprioception, UE functional use, IADL, ROM, Endurance, Mobility, Coordination, Sensation, FMC, Decreased knowledge of use of DME Cognitive Skills: Attention, Memory, Perception     Visit Diagnosis: Hemiplegia and hemiparesis following cerebral infarction affecting right dominant side (HCC)  Unsteadiness on feet  Muscle weakness (generalized)  Other lack of coordination  Visuospatial deficit    Problem List Patient Active Problem List   Diagnosis Date Noted   Nausea without vomiting    Spastic hemiparesis (HCC)    History of hypertension    Neurogenic orthostatic hypotension (HCC)    Transaminitis    Right hemiplegia ([place])    Hemorrhagic stroke ([place])    Leucocytosis    Essential hypertension    Dyslipidemia    Right hemiparesis ([place])    ICH (intracerebral hemorrhage) ([place]) 12/01/2020   HYPERLIPIDEMIA 07/06/2009   OBESITY 07/06/2009    Zachery Conch, OT 04/04/2021, 9:43 AM  [facility] [address] [place], [place], [address] Phone: [phone]   Fax:  [phone]  Name:  KAYSIE MICHELINI MRN: [id number] Date of Birth: 1965-06-15

## 2021-04-04 NOTE — Therapy (Signed)
[facility] [address] [place], [place], [address] Phone: [phone]   Fax:  [phone]  Physical Therapy Treatment  Patient Details  Name: Felicia Chen MRN: [id number] Date of Birth: 1965/04/29 Referring Provider (PT): Dr. Dagoberto Ligas   Encounter Date: 04/04/2021   PT End of Session - 04/04/21 0808     Visit Number 11    Number of Visits 25    Date for PT Re-Evaluation 05/17/21    Authorization Type UHC -40 visit limit per discipline?    PT Start Time [phone]   pt late to session   PT Stop Time 0846    PT Time Calculation (min) 40 min    Equipment Utilized During Treatment Gait belt    Activity Tolerance Patient tolerated treatment well    Behavior During Therapy WFL for tasks assessed/performed             Past Medical History:  Diagnosis Date   Anxiety    Gilbert's syndrome     Past Surgical History:  Procedure Laterality Date   ABDOMINAL HYSTERECTOMY N/A 06/11/2012   Procedure: HYSTERECTOMY ABDOMINAL;  Surgeon: Allena Katz, MD;  Location: [facility];  Service: Gynecology;  Laterality: N/A;   CESAREAN SECTION     x 4   CYSTOSCOPY N/A 06/11/2012   Procedure: CYSTOSCOPY;  Surgeon: Allena Katz, MD;  Location: [facility];  Service: Gynecology;  Laterality: N/A;   HERNIA REPAIR     LAPAROSCOPIC ASSISTED VAGINAL HYSTERECTOMY N/A 06/11/2012   Procedure: LAPAROSCOPIC ASSISTED VAGINAL HYSTERECTOMY atttempted;  Surgeon: Allena Katz, MD;  Location: [facility];  Service: Gynecology;  Laterality: N/A;  Attempted Laparoscopic assisted vaginal hysterectomy.   LAPAROSCOPIC LYSIS OF ADHESIONS N/A 06/11/2012   Procedure: LAPAROSCOPIC LYSIS OF ADHESIONS;  Surgeon: Allena Katz, MD;  Location: [facility];  Service: Gynecology;  Laterality: N/A;   SALPINGOOPHORECTOMY Bilateral 06/11/2012   Procedure: SALPINGO OOPHORECTOMY;  Surgeon: Allena Katz, MD;  Location: [facility];  Service: Gynecology;  Laterality:  Bilateral;    Vitals:   04/04/21 0809  BP: 115/74  Pulse: 97     Subjective Assessment - 04/04/21 0808     Subjective No changes, no falls. Morning stiffness is feeling better in the legs.    Patient is accompained by: Family member   daughther [name], sister [name]   Pertinent History anxiety, Gilbert's syndrome, obesity, HLD.    Limitations Walking;House hold activities;Standing    How long can you walk comfortably? 10 minutes    Patient Stated Goals wants to get back to normal.    Currently in Pain? No/denies    Pain Onset More than a month ago                               [facility] Adult PT Treatment/Exercise - 04/04/21 0840       Transfers   Transfers Sit to Stand;Stand to Sit    Sit to Stand 4: Min guard    Sit to Stand Details Verbal cues for sequencing;Verbal cues for technique;Tactile cues for weight shifting    Stand to Sit 5: Supervision    Stand Pivot Transfers 5: Supervision    Stand Pivot Transfer Details (indicate cue type and reason) Pt doing a better job today turning all the way with RW before sitting down      Ambulation/Gait   Ambulation/Gait Yes    Ambulation/Gait  Assistance 4: Min guard;4: Min assist    Ambulation/Gait Assistance Details Continued to use R PLS [place] AFO. Therapist providing manual/tactile cues at pt's pelvis for improved hip alignment and to decr hip external rotation. Pt does well with this with manual cues for neutral hip positioning, but when removed does revert more to external rotated position to help clear foot.    Ambulation Distance (Feet) 115 Feet   x1, 100 x 1   Assistive device Rolling walker    Gait Pattern Step-through pattern;Decreased stance time - right;Decreased step length - left;Decreased hip/knee flexion - right;Decreased dorsiflexion - right;Decreased weight shift to right;Poor foot clearance - right;Right foot flat;Wide base of support;Abducted- right    Ambulation Surface Level;Indoor      Neuro  Re-ed    Neuro Re-ed Details  With mirror in front of pt for visual feedback: sit to stands from elevated mat table with 2" block under LLE for incr weight shift to RLE, cued for incr forward weight shift and in standing use of mirror as visual cue for midline positioning and incr weight shift to R - cued to bump R hip with therapist and keep it there while sitting down to ensure incr RLE weight shift (pt seemed to do best with this cue). Performed stand > sit with no UE support with additional manual cues at pelvis for incr RLE weight shift. Performed x10 reps      Knee/Hip Exercises: Aerobic   Stepper SciFit with BUE/BLE for 5 minutes at gear 1.7 for strengthening, activity tolerance. Last 1:30 with BLE only. Use of gait belt around thighs to help keeps legs in neutral position.                 Balance Exercises - 04/04/21 0841       Balance Exercises: Standing   SLS with Vectors Solid surface;Limitations    SLS with Vectors Limitations 2 x 10 reps tapping LLE to 4" step, cues for weight shift and glute activation on RLE, UE support > none with min guard    Standing, One Foot on a Step Eyes open;4 inch;Limitations    Standing, One Foot on a Step Limitations with RLE as stance leg - 2 x 10 second holds without UE support, then an additional 2 reps of 3 head turns with min guard for balance    Step Ups Lateral;4 inch;UE support 2;Limitations    Step Ups Limitations with RLE as stance leg 2 x 10 reps, therapist initially helping to place RLE in neutral positioning                  PT Short Term Goals - 03/24/21 1313       PT SHORT TERM GOAL #1   Title Pt will be independent with initial HEP in with family supervision in order to build upon functional gains made in therapy. ALL STGS DUE 03/17/21    Baseline family not present today to review HEP, but pt reports that performing consistently at home, will benefit from family review in future session    Time 4    Period Weeks     Status Deferred    Target Date 03/17/21      PT SHORT TERM GOAL #2   Title Pt will undergo further assessment of BERG with LTG written as appropriate.    Baseline 36/56 initial assessment    Time 4    Period Weeks    Status Achieved      PT  SHORT TERM GOAL #3   Title Pt will improve gait speed to at least 1.25 ft/sec in order to demo improved household mobility.    Baseline .90 ft/sec with RW; 1.96 ft/sec    Time 4    Period Weeks    Status Achieved      PT SHORT TERM GOAL #4   Title Pt will ambulate at least 81' with RW and supervision in order to demo improved household mobility.    Baseline 230 w/ RW and CGA/MinA    Time 4    Period Weeks    Status Partially Met      PT SHORT TERM GOAL #5   Title Pt will perform 5x sit <> stand with UE support in 21 seconds or less with supervision in order to demo decr fall risk.    Baseline 23.69 seconds with min guard; 19.59 secs with single UE support    Time 4    Period Weeks    Status Achieved               PT Long Term Goals - 02/17/21 1205       PT LONG TERM GOAL #1   Title Pt will be independent with final HEP in with family supervision in order to build upon functional gains made in therapy. ALL LTGS DUE 04/14/21    Time 8    Period Weeks    Status New    Target Date 04/14/21      PT LONG TERM GOAL #2   Title BERG goal written to demo decr fall risk.    Time 8    Period Weeks    Status New      PT LONG TERM GOAL #3   Title Pt will ambulate at least 300' outdoors with RW and supervision in order to demo improved community mobility.    Time 8    Period Weeks    Status New      PT LONG TERM GOAL #4   Title Pt will go up and down 12 stairs with single handrail with supervision/min guard with step to pattern in order to safely go up/down the stairs in her house.    Baseline not yet assessed.    Time 8    Period Weeks    Status New      PT LONG TERM GOAL #5   Title Pt will perform 5x sit <> stand with UE  support in 16 seconds or less with supervision in order to demo decr fall risk.    Baseline 23.69 seconds with min guard    Time 8    Period Weeks    Status New      Additional Long Term Goals   Additional Long Term Goals Yes      PT LONG TERM GOAL #6   Title Pt will improve gait speed to at least 1.9 ft/sec in order to demo improved household mobility and decr fall risk.    Baseline .90 ft/sec with RW    Time 8    Period Weeks    Status New                   Plan - 04/04/21 1039     Clinical Impression Statement Continued to work on gait training with R PLS [place] AFO (pt was casted by [employer] last week and will be receiving a custom AFO in a few weeks), with therapist continuing to help assist RLE for more  neutral hip positioning to decr R hip external rotation. Also focused on RLE weight bearing/standing balance with pt able to perform some SLS tasks today on RLE without UE support in // bars, did need min guard for balance. Trialed the SciFit today with pt needing a gait belt around thighs for hip postioning as pt's RLE in incr ABDucted positioning without it. Will continue to progress towards LTGs.    Personal Factors and Comorbidities Comorbidity 3+;Past/Current Experience;Time since onset of injury/illness/exacerbation    Comorbidities anxiety, Gilbert's syndrome, obesity, HLD    Examination-Activity Limitations Bathing;Bend;Caring for Others;Toileting;Stand;Stairs;Squat;Reach Overhead;Locomotion Level;Transfers    Examination-Participation Restrictions Cleaning;Community Activity;Driving;Medication Management    Stability/Clinical Decision Making Evolving/Moderate complexity    Rehab Potential Good    PT Frequency 2x / week    PT Duration 12 weeks    PT Treatment/Interventions ADLs/Self Care Home Management;Electrical Stimulation;DME Instruction;Gait training;Stair training;Functional mobility training;Therapeutic activities;Aquatic Therapy;Therapeutic exercise;Balance  training;Neuromuscular re-education;Manual techniques;Orthotic Fit/Training;Patient/family education;Passive range of motion;Energy conservation;Vestibular;Visual/perceptual remediation/compensation    PT Next Visit Plan tall kneeling activities, go down on side. RLE NMR and strengthening (esp R hip). sit <> stands/functional strength with incr RLE weight bearing. Gait training with RW - ott[place] PLS AFO (pt will be getting a custom AFO from [employer]) , Continue to practice stairs. SciFit    PT Home Exercise Plan [id number]    Consulted and Agree with Plan of Care Patient;Family member/caregiver             Patient will benefit from skilled therapeutic intervention in order to improve the following deficits and impairments:  Abnormal gait, Decreased activity tolerance, Decreased balance, Decreased cognition, Decreased endurance, Decreased coordination, Decreased range of motion, Decreased safety awareness, Decreased strength, Hypomobility, Increased edema, Impaired sensation, Postural dysfunction, Impaired flexibility, Impaired tone, Difficulty walking, Impaired UE functional use, Impaired vision/preception  Visit Diagnosis: Hemiplegia and hemiparesis following cerebral infarction affecting right dominant side (HCC)  Unsteadiness on feet  Other lack of coordination  Muscle weakness (generalized)     Problem List Patient Active Problem List   Diagnosis Date Noted   Nausea without vomiting    Spastic hemiparesis ([place])    History of hypertension    Neurogenic orthostatic hypotension (HCC)    Transaminitis    Right hemiplegia ([place])    Hemorrhagic stroke ([place])    Leucocytosis    Essential hypertension    Dyslipidemia    Right hemiparesis ([place])    ICH (intracerebral hemorrhage) ([place]) 12/01/2020   HYPERLIPIDEMIA 07/06/2009   OBESITY 07/06/2009    Arliss Journey, PT, DPT  04/04/2021, 10:42 AM  [facility] [address] [place], [place], [address] Phone: [phone]   Fax:  [phone]  Name: Felicia Chen MRN: [id number] Date of Birth: 12-Sep-1965

## 2021-04-04 NOTE — Therapy (Signed)
[facility] [address] [place], [place], [address] Phone: [phone]   Fax:  [phone]  Speech Language Pathology Treatment  Patient Details  Name: Felicia Chen MRN: [id number] Date of Birth: March 03, 1966 Referring Provider (SLP): Frann Rider NP   Encounter Date: 04/04/2021   End of Session - 04/04/21 0939     Visit Number 9    Number of Visits 25    Date for SLP Re-Evaluation 05/11/21    Authorization Type UHC, 40 per discipline per notes    Authorization - Visit Number 9    Authorization - Number of Visits 45    SLP Start Time 0935    SLP Stop Time  1015    SLP Time Calculation (min) 40 min    Activity Tolerance Patient tolerated treatment well             Past Medical History:  Diagnosis Date   Anxiety    Gilbert's syndrome     Past Surgical History:  Procedure Laterality Date   ABDOMINAL HYSTERECTOMY N/A 06/11/2012   Procedure: HYSTERECTOMY ABDOMINAL;  Surgeon: Allena Katz, MD;  Location: [facility];  Service: Gynecology;  Laterality: N/A;   CESAREAN SECTION     x 4   CYSTOSCOPY N/A 06/11/2012   Procedure: CYSTOSCOPY;  Surgeon: Allena Katz, MD;  Location: [facility];  Service: Gynecology;  Laterality: N/A;   HERNIA REPAIR     LAPAROSCOPIC ASSISTED VAGINAL HYSTERECTOMY N/A 06/11/2012   Procedure: LAPAROSCOPIC ASSISTED VAGINAL HYSTERECTOMY atttempted;  Surgeon: Allena Katz, MD;  Location: [facility];  Service: Gynecology;  Laterality: N/A;  Attempted Laparoscopic assisted vaginal hysterectomy.   LAPAROSCOPIC LYSIS OF ADHESIONS N/A 06/11/2012   Procedure: LAPAROSCOPIC LYSIS OF ADHESIONS;  Surgeon: Allena Katz, MD;  Location: [facility];  Service: Gynecology;  Laterality: N/A;   SALPINGOOPHORECTOMY Bilateral 06/11/2012   Procedure: SALPINGO OOPHORECTOMY;  Surgeon: Allena Katz, MD;  Location: [facility];  Service: Gynecology;  Laterality: Bilateral;    There were no vitals filed  for this visit.   Subjective Assessment - 04/04/21 0935     Subjective "they think that medicine causes even more anxiety"    Patient is accompained by: Family member    Currently in Pain? No/denies                   ADULT SLP TREATMENT - 04/04/21 0936       General Information   Behavior/Cognition Alert;Cooperative;Pleasant mood     Treatment Provided   Treatment provided Cognitive-Linquistic      Cognitive-Linquistic Treatment   Treatment focused on Cognition;Patient/family/caregiver education    Skilled Treatment Pt arrived with two daughters. Improved anxiety reported today as pt is no longer taking Wellbutrin. Daughters indicated positive change. Good cognitive functioning endorsed with rare challenges reported in conversation as pt sometimes needs extra processing time for recall/attention and/or anxiety. Pt identified solution to call more friends to increase confidence and provide opportunities to practice conversing with less familiar partners. SLP reviewed math HEP, in which rare errors x2 noted. Pt able to self-correct independently without A. SLP provided [employer] as HEP. Pt endorsed benefit of reading strategy to reread twice to aid attention while reading. SLP instructed additional reading strategies, including reading aloud and using visual tracker for attention. SLP suggested possible decrease to 1x/week if progress continues, in which pt verbalized this would be an adequate next step.  Assessment / Recommendations / Plan   Plan Continue with current plan of care      Progression Toward Goals   Progression toward goals Progressing toward goals              SLP Education - 04/04/21 1054     Education Details [employer], attention strategies for reading (aloud, read twice, use visual tracker)    Person(s) Educated Patient;Child(ren)    Methods Explanation;Demonstration;Handout    Comprehension Verbalized understanding;Returned  demonstration;Need further instruction              SLP Short Term Goals - 03/31/21 0927       SLP SHORT TERM GOAL #1   Title Pt will be independent in mediation management with no missed meds over 1 week    Status Achieved      SLP SHORT TERM GOAL #2   Title Pt will carryover 2 compensations for attention to complete 2 IADL's over 1 week with rare min A    Status Achieved      SLP SHORT TERM GOAL #3   Title Pt will carryover compensations for attention, processing and sequencing to complete light meal prep    Status Achieved      SLP SHORT TERM GOAL #4   Title Pt will keep agenda or calendar to manage her daily appointments and to do list with rare min A from family    Status Achieved              SLP Long Term Goals - 04/04/21 1000       SLP LONG TERM GOAL #1   Title Pt will carryover 3 compensations for attention and error awareness to pay 3 bills with rare min A from family    Time 7    Period Weeks    Status On-going      SLP LONG TERM GOAL #2   Title Pt will ID and self correct 80% of errors on cognitive linguistic tasks with rare min A    Baseline 03-24-21; 1-9/23, 04-04-21    Time 7    Period Weeks    Status On-going      SLP LONG TERM GOAL #3   Title Pt will complete 3 executive function tasks with 90% accuracy and rare min A    Baseline 03-24-21    Time 7    Period Weeks    Status On-going              Plan - 04/04/21 1106     Clinical Impression Statement Felicia Chen is referred for outpt ST due to cognitive impairments s/p L hemorrhagic CVA. 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 At this time, pt is now managing her medications and appointments with success. Pt is beginning to establish systems to return to financial management of household and business. SLP educated attention strategies for reading, including re-reading twice, slowing down, using visual tracker, and reading aloud. If  progress continues, pt would likely benefit from decrease to 1x/week for ST. I recommend skilled ST to maximize cognition for return to PLOF, for safety and independence.    Speech Therapy Frequency 2x / week    Duration 12 weeks    Treatment/Interventions Environmental controls;Cueing hierarchy;SLP instruction and feedback;Compensatory strategies;Functional tasks;Cognitive reorganization;Compensatory techniques;Internal/external aids;Multimodal communcation approach;Patient/family education    Potential to Achieve Goals Good             Patient will  benefit from skilled therapeutic intervention in order to improve the following deficits and impairments:   Cognitive communication deficit    Problem List Patient Active Problem List   Diagnosis Date Noted   Nausea without vomiting    Spastic hemiparesis ([place])    History of hypertension    Neurogenic orthostatic hypotension (HCC)    Transaminitis    Right hemiplegia (HCC)    Hemorrhagic stroke ([place])    Leucocytosis    Essential hypertension    Dyslipidemia    Right hemiparesis ([place])    ICH (intracerebral hemorrhage) ([place]) 12/01/2020   HYPERLIPIDEMIA 07/06/2009   OBESITY 07/06/2009    Alinda Deem, [place] 04/04/2021, 11:08 AM  [facility] [address] [place], [place], [address] Phone: [phone]   Fax:  [phone]   Name: Felicia Chen MRN: [id number] Date of Birth: 15-Sep-1965

## 2021-04-06 ENCOUNTER — Encounter: Payer: 59 | Admitting: Speech Pathology

## 2021-04-06 ENCOUNTER — Ambulatory Visit: Payer: 59 | Admitting: Physical Therapy

## 2021-04-06 ENCOUNTER — Encounter: Payer: 59 | Admitting: Occupational Therapy

## 2021-04-08 ENCOUNTER — Ambulatory Visit: Payer: 59 | Admitting: Occupational Therapy

## 2021-04-08 ENCOUNTER — Ambulatory Visit: Payer: 59

## 2021-04-08 ENCOUNTER — Other Ambulatory Visit: Payer: Self-pay

## 2021-04-08 ENCOUNTER — Ambulatory Visit: Payer: 59 | Admitting: Speech Pathology

## 2021-04-08 ENCOUNTER — Encounter: Payer: Self-pay | Admitting: Speech Pathology

## 2021-04-08 ENCOUNTER — Encounter: Payer: Self-pay | Admitting: Occupational Therapy

## 2021-04-08 DIAGNOSIS — I69351 Hemiplegia and hemiparesis following cerebral infarction affecting right dominant side: Secondary | ICD-10-CM

## 2021-04-08 DIAGNOSIS — R29818 Other symptoms and signs involving the nervous system: Secondary | ICD-10-CM

## 2021-04-08 DIAGNOSIS — R2689 Other abnormalities of gait and mobility: Secondary | ICD-10-CM

## 2021-04-08 DIAGNOSIS — M6281 Muscle weakness (generalized): Secondary | ICD-10-CM | POA: Diagnosis not present

## 2021-04-08 DIAGNOSIS — R2681 Unsteadiness on feet: Secondary | ICD-10-CM

## 2021-04-08 DIAGNOSIS — R41842 Visuospatial deficit: Secondary | ICD-10-CM

## 2021-04-08 DIAGNOSIS — R278 Other lack of coordination: Secondary | ICD-10-CM

## 2021-04-08 DIAGNOSIS — R41841 Cognitive communication deficit: Secondary | ICD-10-CM

## 2021-04-08 DIAGNOSIS — I69318 Other symptoms and signs involving cognitive functions following cerebral infarction: Secondary | ICD-10-CM

## 2021-04-08 NOTE — Patient Instructions (Addendum)
° ° °  Call Dr. Ferne Coe office weekly and ask if there are any cancels or openings  Energy conservation - if you have something important in the evening, then rest up during the day  Consider a handicap sticker

## 2021-04-08 NOTE — Therapy (Signed)
[facility] [address] [place], [place], [address] Phone: [phone]   Fax:  [phone]  Physical Therapy Treatment  Patient Details  Name: Felicia Chen MRN: [id number] Date of Birth: 1965/07/14 Referring Provider (PT): Dr. Dagoberto Ligas   Encounter Date: 04/08/2021   PT End of Session - 04/08/21 0933     Visit Number 12    Number of Visits 25    Date for PT Re-Evaluation 05/17/21    Authorization Type UHC -40 visit limit per discipline?    PT Start Time 0932   patient finishing up with speech   PT Stop Time 1014    PT Time Calculation (min) 42 min    Equipment Utilized During Treatment Gait belt    Activity Tolerance Patient tolerated treatment well    Behavior During Therapy WFL for tasks assessed/performed             Past Medical History:  Diagnosis Date   Anxiety    Gilbert's syndrome     Past Surgical History:  Procedure Laterality Date   ABDOMINAL HYSTERECTOMY N/A 06/11/2012   Procedure: HYSTERECTOMY ABDOMINAL;  Surgeon: Allena Katz, MD;  Location: [facility];  Service: Gynecology;  Laterality: N/A;   CESAREAN SECTION     x 4   CYSTOSCOPY N/A 06/11/2012   Procedure: CYSTOSCOPY;  Surgeon: Allena Katz, MD;  Location: [facility];  Service: Gynecology;  Laterality: N/A;   HERNIA REPAIR     LAPAROSCOPIC ASSISTED VAGINAL HYSTERECTOMY N/A 06/11/2012   Procedure: LAPAROSCOPIC ASSISTED VAGINAL HYSTERECTOMY atttempted;  Surgeon: Allena Katz, MD;  Location: [facility];  Service: Gynecology;  Laterality: N/A;  Attempted Laparoscopic assisted vaginal hysterectomy.   LAPAROSCOPIC LYSIS OF ADHESIONS N/A 06/11/2012   Procedure: LAPAROSCOPIC LYSIS OF ADHESIONS;  Surgeon: Allena Katz, MD;  Location: [facility];  Service: Gynecology;  Laterality: N/A;   SALPINGOOPHORECTOMY Bilateral 06/11/2012   Procedure: SALPINGO OOPHORECTOMY;  Surgeon: Allena Katz, MD;  Location: [facility];  Service: Gynecology;   Laterality: Bilateral;    There were no vitals filed for this visit.   Subjective Assessment - 04/08/21 0934     Subjective No new changes. Reports have an appt at beginning of February, potential starting medication for anxiety. No falls. No pain.    Patient is accompained by: Family member   daughther [name], sister [name]   Pertinent History anxiety, Gilbert's syndrome, obesity, HLD.    Limitations Walking;House hold activities;Standing    How long can you walk comfortably? 10 minutes    Patient Stated Goals wants to get back to normal.    Currently in Pain? No/denies    Pain Onset More than a month ago                [facility] Adult PT Treatment/Exercise - 04/08/21 0001       Transfers   Transfers Sit to Stand;Stand to Sit    Sit to Stand 4: Min guard    Stand to Sit 5: Supervision      Ambulation/Gait   Ambulation/Gait Yes    Ambulation/Gait Assistance 4: Min guard    Ambulation/Gait Assistance Details Completed ambulation from parking lot to inside to mat x 300 ft with RW to simulate walking into therapy session and decreasing use of RW. Unable to simulate getting in/out of car due to husband dropped her off today. Educated patient that she has clearance to ambulate into session with RW but MUST have  a family member present and provide contact guard for safety at this time. Patietn and sister verbalize understanding.    Ambulation Distance (Feet) 300 Feet    Assistive device Rolling walker    Gait Pattern Step-through pattern;Decreased stance time - right;Decreased step length - left;Decreased hip/knee flexion - right;Decreased dorsiflexion - right;Decreased weight shift to right;Poor foot clearance - right;Right foot flat;Wide base of support;Abducted- right    Ambulation Surface Level;Indoor;Unlevel;Outdoor;Paved      Neuro Re-ed    Neuro Re-ed Details  in // bars: completed standing with LLE placed on 6' step and weightbearing through RLE. Completed standing without UE  support 3 x 30 seconds. Then with RLE placed on step and LLE posterior, completed hip flexion x 10 reps with light UE support. With target on floor completed steps forward with LLE and weight shift to RLE x 10 reps, PT providing faciliation for improved stance on RLE. Then steps forward with RLE working on improved heel strike and clearance with posterior step back vs. dragging foot.      Exercises   Exercises Knee/Hip      Knee/Hip Exercises: Aerobic   Stepper SciFit with BUE/BLE for 6 minutes at gear 2.0 for strengthening, activity tolerance. Pt tolerating increase in resistance well. Use of gait belt around thighs to help keeps legs in neutral position.      Knee/Hip Exercises: Standing   Lateral Step Up Right;1 set;10 reps;Hand Hold: 2;Step Height: 6"    Lateral Step Up Limitations with 6" step, cues for step length and standing tall/pushing through RLE.    Forward Step Up Right;1 set;10 reps;Hand Hold: 2;Step Height: 6"    Forward Step Up Limitations with 6" step, cues for step length and standing tall/pushing through RLE. Increased cues for clearance of RLE onto step.                       PT Short Term Goals - 03/24/21 1313       PT SHORT TERM GOAL #1   Title Pt will be independent with initial HEP in with family supervision in order to build upon functional gains made in therapy. ALL STGS DUE 03/17/21    Baseline family not present today to review HEP, but pt reports that performing consistently at home, will benefit from family review in future session    Time 4    Period Weeks    Status Deferred    Target Date 03/17/21      PT SHORT TERM GOAL #2   Title Pt will undergo further assessment of BERG with LTG written as appropriate.    Baseline 36/56 initial assessment    Time 4    Period Weeks    Status Achieved      PT SHORT TERM GOAL #3   Title Pt will improve gait speed to at least 1.25 ft/sec in order to demo improved household mobility.    Baseline .90  ft/sec with RW; 1.96 ft/sec    Time 4    Period Weeks    Status Achieved      PT SHORT TERM GOAL #4   Title Pt will ambulate at least 32' with RW and supervision in order to demo improved household mobility.    Baseline 230 w/ RW and CGA/MinA    Time 4    Period Weeks    Status Partially Met      PT SHORT TERM GOAL #5   Title Pt will perform 5x  sit <> stand with UE support in 21 seconds or less with supervision in order to demo decr fall risk.    Baseline 23.69 seconds with min guard; 19.59 secs with single UE support    Time 4    Period Weeks    Status Achieved               PT Long Term Goals - 02/17/21 1205       PT LONG TERM GOAL #1   Title Pt will be independent with final HEP in with family supervision in order to build upon functional gains made in therapy. ALL LTGS DUE 04/14/21    Time 8    Period Weeks    Status New    Target Date 04/14/21      PT LONG TERM GOAL #2   Title BERG goal written to demo decr fall risk.    Time 8    Period Weeks    Status New      PT LONG TERM GOAL #3   Title Pt will ambulate at least 300' outdoors with RW and supervision in order to demo improved community mobility.    Time 8    Period Weeks    Status New      PT LONG TERM GOAL #4   Title Pt will go up and down 12 stairs with single handrail with supervision/min guard with step to pattern in order to safely go up/down the stairs in her house.    Baseline not yet assessed.    Time 8    Period Weeks    Status New      PT LONG TERM GOAL #5   Title Pt will perform 5x sit <> stand with UE support in 16 seconds or less with supervision in order to demo decr fall risk.    Baseline 23.69 seconds with min guard    Time 8    Period Weeks    Status New      Additional Long Term Goals   Additional Long Term Goals Yes      PT LONG TERM GOAL #6   Title Pt will improve gait speed to at least 1.9 ft/sec in order to demo improved household mobility and decr fall risk.    Baseline  .90 ft/sec with RW    Time 8    Period Weeks    Status New                   Plan - 04/08/21 1050     Clinical Impression Statement Trialed ambulating outdoors with RW from parking lot into session, to reduce use of RW. Patient able to complete with supervision/CGA, PT educating that she cant try this but must have family member present and provide CGA to patietn for safety. Continued rest of session focused on NMR for RLE strengthening. Will continue to progress toward all LTGs.    Personal Factors and Comorbidities Comorbidity 3+;Past/Current Experience;Time since onset of injury/illness/exacerbation    Comorbidities anxiety, Gilbert's syndrome, obesity, HLD    Examination-Activity Limitations Bathing;Bend;Caring for Others;Toileting;Stand;Stairs;Squat;Reach Overhead;Locomotion Level;Transfers    Examination-Participation Restrictions Cleaning;Community Activity;Driving;Medication Management    Stability/Clinical Decision Making Evolving/Moderate complexity    Rehab Potential Good    PT Frequency 2x / week    PT Duration 12 weeks    PT Treatment/Interventions ADLs/Self Care Home Management;Electrical Stimulation;DME Instruction;Gait training;Stair training;Functional mobility training;Therapeutic activities;Aquatic Therapy;Therapeutic exercise;Balance training;Neuromuscular re-education;Manual techniques;Orthotic Fit/Training;Patient/family education;Passive range of motion;Energy conservation;Vestibular;Visual/perceptual remediation/compensation  PT Next Visit Plan Did we walk into session? tall kneeling activities, go down on side. RLE NMR and strengthening (esp R hip). sit <> stands/functional strength with incr RLE weight bearing. Gait training with RW - [place] PLS AFO (pt will be getting a custom AFO from [employer]) , Continue to practice stairs. SciFit    PT Home Exercise Plan [id number]    Consulted and Agree with Plan of Care Patient;Family member/caregiver              Patient will benefit from skilled therapeutic intervention in order to improve the following deficits and impairments:  Abnormal gait, Decreased activity tolerance, Decreased balance, Decreased cognition, Decreased endurance, Decreased coordination, Decreased range of motion, Decreased safety awareness, Decreased strength, Hypomobility, Increased edema, Impaired sensation, Postural dysfunction, Impaired flexibility, Impaired tone, Difficulty walking, Impaired UE functional use, Impaired vision/preception  Visit Diagnosis: Hemiplegia and hemiparesis following cerebral infarction affecting right dominant side (HCC)  Unsteadiness on feet  Muscle weakness (generalized)  Other abnormalities of gait and mobility     Problem List Patient Active Problem List   Diagnosis Date Noted   Nausea without vomiting    Spastic hemiparesis (HCC)    History of hypertension    Neurogenic orthostatic hypotension (HCC)    Transaminitis    Right hemiplegia ([place])    Hemorrhagic stroke (HCC)    Leucocytosis    Essential hypertension    Dyslipidemia    Right hemiparesis (HCC)    ICH (intracerebral hemorrhage) ([place]) 12/01/2020   HYPERLIPIDEMIA 07/06/2009   OBESITY 07/06/2009    Jones Bales, PT, DPT 04/08/2021, 10:52 AM  [facility] [address] [place], [place], [address] Phone: [phone]   Fax:  [phone]  Name: CAROLEENA PAOLINI MRN: [id number] Date of Birth: 1965-10-16

## 2021-04-08 NOTE — Therapy (Signed)
[facility] [address] [place], [place], [address] Phone: [phone]   Fax:  [phone]  Occupational Therapy Treatment  Patient Details  Name: Felicia Chen MRN: [id number] Date of Birth: 02/29/1966 Referring Provider (OT): Dr. Courtney Heys   Encounter Date: 04/08/2021   OT End of Session - 04/08/21 1021     Visit Number 13    Number of Visits 25    Date for OT Re-Evaluation 05/17/21    Authorization Type UHC: VL 40 per discipline, no auth required    OT Start Time 1015    OT Stop Time 1100    OT Time Calculation (min) 45 min    Activity Tolerance Patient tolerated treatment well    Behavior During Therapy [facility] for tasks assessed/performed             Past Medical History:  Diagnosis Date   Anxiety    Gilbert's syndrome     Past Surgical History:  Procedure Laterality Date   ABDOMINAL HYSTERECTOMY N/A 06/11/2012   Procedure: HYSTERECTOMY ABDOMINAL;  Surgeon: Allena Katz, MD;  Location: [facility];  Service: Gynecology;  Laterality: N/A;   CESAREAN SECTION     x 4   CYSTOSCOPY N/A 06/11/2012   Procedure: CYSTOSCOPY;  Surgeon: Allena Katz, MD;  Location: [facility];  Service: Gynecology;  Laterality: N/A;   HERNIA REPAIR     LAPAROSCOPIC ASSISTED VAGINAL HYSTERECTOMY N/A 06/11/2012   Procedure: LAPAROSCOPIC ASSISTED VAGINAL HYSTERECTOMY atttempted;  Surgeon: Allena Katz, MD;  Location: [facility];  Service: Gynecology;  Laterality: N/A;  Attempted Laparoscopic assisted vaginal hysterectomy.   LAPAROSCOPIC LYSIS OF ADHESIONS N/A 06/11/2012   Procedure: LAPAROSCOPIC LYSIS OF ADHESIONS;  Surgeon: Allena Katz, MD;  Location: [facility];  Service: Gynecology;  Laterality: N/A;   SALPINGOOPHORECTOMY Bilateral 06/11/2012   Procedure: SALPINGO OOPHORECTOMY;  Surgeon: Allena Katz, MD;  Location: [facility];  Service: Gynecology;  Laterality: Bilateral;    There were no vitals filed for this  visit.   Subjective Assessment - 04/08/21 1015     Subjective  "just the same ole - trying to move forward"    Patient is accompanied by: Family member    Pertinent History ICH 12/01/20 Lt frontal/parietal lobes w/ SAH extending over bilat cerebral hemispheres and cerebellum, Lt temporal lobe ischemic CVA w/ residual Rt hemiplegia. PMH: HLD, anxiety, covid-19, Gilbert's syndrome    Limitations fall risk    Patient Stated Goals get back to normal    Currently in Pain? No/denies    Pain Score 0-No pain             Small Pegboard with RUE with removing with in hand manipulation. Min difficulty and min drops for coordination. Copied pattern with no difficulty  Hand Gripper: with RUE on level 2 with black spring. Pt picked up 1 inch blocks with gripper with min drops and min difficulty.   Pennies with RUE with stacking and in hand manipulation with placing in piggy bank with min difficulty.                          OT Short Term Goals - 04/04/21 0907       OT SHORT TERM GOAL #1   Title Independent with HEP for RUE shoulder ROM, hand coordination and hand strength    Time 4    Period Weeks    Status Achieved  Target Date 03/16/21      OT SHORT TERM GOAL #2   Title Pt to perform all BADLS with no more than min assist    Time 4    Period Weeks    Status Achieved      OT SHORT TERM GOAL #3   Title Pt to write name and address with 50% or greater legibility    Time 4    Period Weeks    Status Achieved   90% legibility w/ extra time     OT SHORT TERM GOAL #4   Title Pt to prepare simple snack, sandwich, and microwaveable items consistently using DME prn with distant supervision only    Time 4    Period Weeks    Status On-going   pt req'd close supervision d/t unsteadiness with standing at counter for reaching items and managing refrigerator     OT SHORT TERM GOAL #5   Title Pt to improve coordination as evidenced by performing 9 hole peg test Rt hand  in under 1 min. and 15 sec    Baseline 1 min. 45 sec    Time 4    Period Weeks    Status Achieved   52.87 sec     OT SHORT TERM GOAL #6   Title Pt to grip strength Rt hand to 30 lbs or greater in prep for opening jars    Baseline 22 lbs (Lt = 65 lbs)    Time 4    Period Weeks    Status On-going   24.4 lbs              OT Long Term Goals - 02/16/21 1034       OT LONG TERM GOAL #1   Title Independent with strengthening HEP for Rt shoulder    Time 12    Period Weeks    Status New      OT LONG TERM GOAL #2   Title Pt to fully attend to Rt side and return to using Rt hand as dominant hand 90% of the time safely for ADLS w/ task modifications prn    Time 12    Period Weeks    Status New      OT LONG TERM GOAL #3   Title Independent with all BADLS    Time 12    Period Weeks    Status New      OT LONG TERM GOAL #4   Title Pt to perform simple cooking tasks with supervision/min cueing only    Time 12    Period Weeks    Status New      OT LONG TERM GOAL #5   Title Improve coordination Rt hand as evidenced by performing 9 hole peg test in 45 sec or less    Baseline 1 min. 45 sec.    Time 12    Period Weeks    Status New      Long Term Additional Goals   Additional Long Term Goals Yes      OT LONG TERM GOAL #6   Title Pt to improve grip strength Rt hand to 40 lbs or greater to open jars/containers    Baseline 22 lbs (Lt = 65 lbs)    Time 12    Period Weeks    Status New      OT LONG TERM GOAL #7   Title Pt to write name at 90% legibility and 3 sentences at 75% or greater legibility  Time 12    Period Weeks    Status New                   Plan - 04/08/21 1059     Clinical Impression Statement Pt continues to progress with coordination and strength in RUE. Pt continues to req cues for decreased compensations for reaching with RUE.    OT Occupational Profile and History Detailed Assessment- Review of Records and additional review of physical,  cognitive, psychosocial history related to current functional performance    Occupational performance deficits (Please refer to evaluation for details): ADL's;IADL's;Work;Leisure    Body Structure / Function / Physical Skills ADL;Strength;Dexterity;Balance;Tone;Body mechanics;Edema;Proprioception;UE functional use;IADL;ROM;Endurance;Mobility;Coordination;Sensation;FMC;Decreased knowledge of use of DME    Cognitive Skills Attention;Memory;Perception    Rehab Potential Good    Clinical Decision Making Several treatment options, min-mod task modification necessary    Comorbidities Affecting Occupational Performance: May have comorbidities impacting occupational performance    Modification or Assistance to Complete Evaluation  No modification of tasks or assist necessary to complete eval    OT Frequency 2x / week    OT Duration 12 weeks   PLUS EVAL   OT Treatment/Interventions Self-care/ADL training;DME and/or AE instruction;Moist Heat;Therapeutic activities;Compression bandaging;Splinting;Therapeutic exercise;Ultrasound;Cognitive remediation/compensation;Coping strategies training;Visual/perceptual remediation/compensation;Passive range of motion;Neuromuscular education;Electrical Stimulation;Cryotherapy;Manual Therapy;Patient/family education    Plan continue with reaching with RUE, grip strength and coordination, arm bike    Consulted and Agree with Plan of Care Patient;Family member/caregiver    Family Member Consulted husband and daughter             Patient will benefit from skilled therapeutic intervention in order to improve the following deficits and impairments:   Body Structure / Function / Physical Skills: ADL, Strength, Dexterity, Balance, Tone, Body mechanics, Edema, Proprioception, UE functional use, IADL, ROM, Endurance, Mobility, Coordination, Sensation, FMC, Decreased knowledge of use of DME Cognitive Skills: Attention, Memory, Perception     Visit Diagnosis: Unsteadiness on  feet  Hemiplegia and hemiparesis following cerebral infarction affecting right dominant side (HCC)  Muscle weakness (generalized)  Other lack of coordination  Visuospatial deficit  Other abnormalities of gait and mobility  Other symptoms and signs involving cognitive functions following cerebral infarction  Other symptoms and signs involving the nervous system    Problem List Patient Active Problem List   Diagnosis Date Noted   Nausea without vomiting    Spastic hemiparesis (HCC)    History of hypertension    Neurogenic orthostatic hypotension (HCC)    Transaminitis    Right hemiplegia ([place])    Hemorrhagic stroke ([place])    Leucocytosis    Essential hypertension    Dyslipidemia    Right hemiparesis ([place])    ICH (intracerebral hemorrhage) ([place]) 12/01/2020   HYPERLIPIDEMIA 07/06/2009   OBESITY 07/06/2009    Zachery Conch, OT 04/08/2021, 11:00 AM  [facility] [address] [place], [place], [address] Phone: [phone]   Fax:  [phone]  Name: Felicia Chen MRN: [id number] Date of Birth: [birth year]

## 2021-04-08 NOTE — Therapy (Signed)
[facility] [address] [place], [place], [address] Phone: [phone]   Fax:  [phone]  Speech Language Pathology Treatment & Discharge Summary  Patient Details  Name: Felicia Chen MRN: [id number] Date of Birth: 1965/09/03 Referring Provider (SLP): Felicia Rider NP   Encounter Date: 04/08/2021   End of Session - 04/08/21 1014     Visit Number 10    Number of Visits 25    Date for SLP Re-Evaluation 05/11/21    Authorization Type UHC, 40 per discipline per notes    Authorization - Visit Number 10    Authorization - Number of Visits 62    SLP Start Time 0845    SLP Stop Time  0930    SLP Time Calculation (min) 45 min    Activity Tolerance Patient tolerated treatment well             Past Medical History:  Diagnosis Date   Anxiety    Gilbert's syndrome     Past Surgical History:  Procedure Laterality Date   ABDOMINAL HYSTERECTOMY N/A 06/11/2012   Procedure: HYSTERECTOMY ABDOMINAL;  Surgeon: Allena Katz, MD;  Location: [facility];  Service: Gynecology;  Laterality: N/A;   CESAREAN SECTION     x 4   CYSTOSCOPY N/A 06/11/2012   Procedure: CYSTOSCOPY;  Surgeon: Allena Katz, MD;  Location: [facility];  Service: Gynecology;  Laterality: N/A;   HERNIA REPAIR     LAPAROSCOPIC ASSISTED VAGINAL HYSTERECTOMY N/A 06/11/2012   Procedure: LAPAROSCOPIC ASSISTED VAGINAL HYSTERECTOMY atttempted;  Surgeon: Allena Katz, MD;  Location: [facility];  Service: Gynecology;  Laterality: N/A;  Attempted Laparoscopic assisted vaginal hysterectomy.   LAPAROSCOPIC LYSIS OF ADHESIONS N/A 06/11/2012   Procedure: LAPAROSCOPIC LYSIS OF ADHESIONS;  Surgeon: Allena Katz, MD;  Location: [facility];  Service: Gynecology;  Laterality: N/A;   SALPINGOOPHORECTOMY Bilateral 06/11/2012   Procedure: SALPINGO OOPHORECTOMY;  Surgeon: Allena Katz, MD;  Location: [facility];  Service: Gynecology;  Laterality: Bilateral;    There  were no vitals filed for this visit.   Subjective Assessment - 04/08/21 0855     Subjective "Why can't she get into neuropsych when she is in crisis"    Patient is accompained by: Family member   [name]   Currently in Pain? No/denies                   ADULT SLP TREATMENT - 04/08/21 0858       General Information   Behavior/Cognition Alert;Cooperative;Pleasant mood;Other (comment)      Treatment Provided   Treatment provided Cognitive-Linquistic      Cognitive-Linquistic Treatment   Treatment focused on Cognition;Patient/family/caregiver education    Skilled Treatment Felicia Chen reports success managing family finacnes. she is planning meals and family events and successfully keeping track of her children's activities and schedules. She improved her score on the Cognitive Function Form from 119 at eval to 134 today. Felicia Chen endorses that she is back to baseline with cognition. Independent on Error awareness on cognitive task.      Assessment / Recommendations / Plan   Plan Discharge SLP treatment due to (comment)      Progression Toward Goals   Progression toward goals Goals met, education completed, patient discharged from [place]  Visits from Start of Care: 10  Current functional level related to goals /  functional outcomes: See goals below   Remaining deficits: Possible Mild attention when fatigued   Education / Equipment: Compensations for memory and attention, energy conservation   Patient agrees to discharge. Patient goals were met. Patient is being discharged due to meeting the stated rehab goals.Marland Kitchen      SLP Short Term Goals - 04/08/21 1014       SLP SHORT TERM GOAL #1   Title Pt will be independent in mediation management with no missed meds over 1 week    Status Achieved      SLP SHORT TERM GOAL #2   Title Pt will carryover 2 compensations for attention to complete 2 IADL's over 1 week with rare  min A    Status Achieved      SLP SHORT TERM GOAL #3   Title Pt will carryover compensations for attention, processing and sequencing to complete light meal prep    Status Achieved      SLP SHORT TERM GOAL #4   Title Pt will keep agenda or calendar to manage her daily appointments and to do list with rare min A from family    Status Achieved              SLP Long Term Goals - 04/08/21 [place] #1   Title Pt will carryover 3 compensations for attention and error awareness to pay 3 bills with rare min A from family    Time 7    Period Weeks    Status Achieved      SLP LONG TERM GOAL #2   Title Pt will ID and self correct 80% of errors on cognitive linguistic tasks with rare min A    Baseline 03-24-21; 1-9/23, 04-04-21    Time 7    Period Weeks    Status Achieved      SLP LONG TERM GOAL #3   Title Pt will complete 3 executive function tasks with 90% accuracy and rare min A    Baseline 03-24-21    Time 7    Period Weeks    Status Achieved              Plan - 04/08/21 1010     Clinical Impression Statement Felicia Chen has returned to managing daily tasks, her and her childrens' schedules. She is planning events and meals for the family. At this time Felicia Chen and her family feel that her cognition is back to baseline and WNL. I educated that anxiety, stress and fatigue can affect attention/memory. At this time, goals are met, d/c ST. Pt and family in agreement    Speech Therapy Frequency 2x / week    Duration 12 weeks    Treatment/Interventions Environmental controls;Cueing hierarchy;SLP instruction and feedback;Compensatory strategies;Functional tasks;Cognitive reorganization;Compensatory techniques;Internal/external aids;Multimodal communcation approach;Patient/family education    Potential to Achieve Goals Good             Patient will benefit from skilled therapeutic intervention in order to improve the following deficits and impairments:    Cognitive communication deficit    Problem List Patient Active Problem List   Diagnosis Date Noted   Nausea without vomiting    Spastic hemiparesis (HCC)    History of hypertension    Neurogenic orthostatic hypotension (HCC)    Transaminitis    Right hemiplegia (HCC)    Hemorrhagic stroke (HCC)    Leucocytosis    Essential hypertension    Dyslipidemia    Right hemiparesis (  [place])    ICH (intracerebral hemorrhage) ([facility]) 12/01/2020   HYPERLIPIDEMIA 07/06/2009   OBESITY 07/06/2009    Saheed Carrington, Annye Rusk, CCC-SLP 04/08/2021, 10:15 AM  [facility] [address] [place], [place], [address] Phone: [phone]   Fax:  [phone]   Name: Felicia Chen MRN: [id number] Date of Birth: Sep 25, 1965

## 2021-04-11 ENCOUNTER — Other Ambulatory Visit: Payer: Self-pay

## 2021-04-11 ENCOUNTER — Ambulatory Visit: Payer: 59 | Admitting: Speech Pathology

## 2021-04-11 ENCOUNTER — Ambulatory Visit: Payer: 59 | Admitting: Occupational Therapy

## 2021-04-11 ENCOUNTER — Ambulatory Visit: Payer: 59

## 2021-04-11 VITALS — BP 111/82 | HR 100

## 2021-04-11 DIAGNOSIS — R278 Other lack of coordination: Secondary | ICD-10-CM

## 2021-04-11 DIAGNOSIS — R2681 Unsteadiness on feet: Secondary | ICD-10-CM

## 2021-04-11 DIAGNOSIS — M6281 Muscle weakness (generalized): Secondary | ICD-10-CM | POA: Diagnosis not present

## 2021-04-11 DIAGNOSIS — I69351 Hemiplegia and hemiparesis following cerebral infarction affecting right dominant side: Secondary | ICD-10-CM

## 2021-04-11 DIAGNOSIS — R2689 Other abnormalities of gait and mobility: Secondary | ICD-10-CM

## 2021-04-11 NOTE — Therapy (Signed)
[facility] [address] [place], [place], [address] Phone: [phone]   Fax:  [phone]  Occupational Therapy Treatment  Patient Details  Name: Felicia Chen MRN: [id number] Date of Birth: 03/14/1966 Referring Provider (OT): Dr. Courtney Heys   Encounter Date: 04/11/2021   OT End of Session - 04/11/21 0851     Visit Number 14    Number of Visits 25    Date for OT Re-Evaluation 05/17/21    Authorization Type UHC: VL 40 per discipline, no auth required    OT Start Time 0847    OT Stop Time 0930    OT Time Calculation (min) 43 min    Activity Tolerance Patient tolerated treatment well    Behavior During Therapy [facility] for tasks assessed/performed             Past Medical History:  Diagnosis Date   Anxiety    Gilbert's syndrome     Past Surgical History:  Procedure Laterality Date   ABDOMINAL HYSTERECTOMY N/A 06/11/2012   Procedure: HYSTERECTOMY ABDOMINAL;  Surgeon: Allena Katz, MD;  Location: [facility];  Service: Gynecology;  Laterality: N/A;   CESAREAN SECTION     x 4   CYSTOSCOPY N/A 06/11/2012   Procedure: CYSTOSCOPY;  Surgeon: Allena Katz, MD;  Location: [facility];  Service: Gynecology;  Laterality: N/A;   HERNIA REPAIR     LAPAROSCOPIC ASSISTED VAGINAL HYSTERECTOMY N/A 06/11/2012   Procedure: LAPAROSCOPIC ASSISTED VAGINAL HYSTERECTOMY atttempted;  Surgeon: Allena Katz, MD;  Location: [facility];  Service: Gynecology;  Laterality: N/A;  Attempted Laparoscopic assisted vaginal hysterectomy.   LAPAROSCOPIC LYSIS OF ADHESIONS N/A 06/11/2012   Procedure: LAPAROSCOPIC LYSIS OF ADHESIONS;  Surgeon: Allena Katz, MD;  Location: [facility];  Service: Gynecology;  Laterality: N/A;   SALPINGOOPHORECTOMY Bilateral 06/11/2012   Procedure: SALPINGO OOPHORECTOMY;  Surgeon: Allena Katz, MD;  Location: [facility];  Service: Gynecology;  Laterality: Bilateral;    There were no vitals filed for this  visit.   Subjective Assessment - 04/11/21 0850     Subjective  I'm now mostly on the walker    Patient is accompanied by: Family member    Pertinent History ICH 12/01/20 Lt frontal/parietal lobes w/ SAH extending over bilat cerebral hemispheres and cerebellum, Lt temporal lobe ischemic CVA w/ residual Rt hemiplegia. PMH: HLD, anxiety, covid-19, Gilbert's syndrome    Limitations fall risk    Patient Stated Goals get back to normal    Currently in Pain? No/denies             Pt now using walker for mobility - pt/daughter shown walker baskets, bags, and trays and pros/cons to each (handouts provided). Recommended pt pursue either basket or tray to help transport items now that she is more on walker. However recommended pt continue to use w/c in kitchen until we can practice in clinic from standing level  Pt also had questions re: aquatic therapy and discussed possible referral for this.   Assessed progress towards coordination, writing, and grip strength - see goal section for details. Pt has made significant progress on 9 hole peg test  Pt practiced placing rubber washers on lower and higher prongs for RUE reaching - pt required min assist from Lt hand to support RUE for higher prongs to prevent shoulder compensations, but able to do lower levels w/o assist.   Gripper set at level 2 resistance to pick up blocks Rt hand  for sustained grip strength and control.                       OT Short Term Goals - 04/04/21 0907       OT SHORT TERM GOAL #1   Title Independent with HEP for RUE shoulder ROM, hand coordination and hand strength    Time 4    Period Weeks    Status Achieved    Target Date 03/16/21      OT SHORT TERM GOAL #2   Title Pt to perform all BADLS with no more than min assist    Time 4    Period Weeks    Status Achieved      OT SHORT TERM GOAL #3   Title Pt to write name and address with 50% or greater legibility    Time 4    Period Weeks     Status Achieved   90% legibility w/ extra time     OT SHORT TERM GOAL #4   Title Pt to prepare simple snack, sandwich, and microwaveable items consistently using DME prn with distant supervision only    Time 4    Period Weeks    Status On-going   pt req'd close supervision d/t unsteadiness with standing at counter for reaching items and managing refrigerator     OT SHORT TERM GOAL #5   Title Pt to improve coordination as evidenced by performing 9 hole peg test Rt hand in under 1 min. and 15 sec    Baseline 1 min. 45 sec    Time 4    Period Weeks    Status Achieved   52.87 sec     OT SHORT TERM GOAL #6   Title Pt to grip strength Rt hand to 30 lbs or greater in prep for opening jars    Baseline 22 lbs (Lt = 65 lbs)    Time 4    Period Weeks    Status On-going   24.4 lbs              OT Long Term Goals - 04/11/21 0911       OT LONG TERM GOAL #1   Title Independent with strengthening HEP for Rt shoulder    Time 12    Period Weeks    Status On-going      OT LONG TERM GOAL #2   Title Pt to fully attend to Rt side and return to using Rt hand as dominant hand 90% of the time safely for ADLS w/ task modifications prn    Time 12    Period Weeks    Status On-going      OT LONG TERM GOAL #3   Title Independent with all BADLS    Time 12    Period Weeks    Status On-going   assist w/ shower transfer and hooking bra     OT LONG TERM GOAL #4   Title Pt to perform simple cooking tasks with supervision/min cueing only    Time 12    Period Weeks    Status New      OT LONG TERM GOAL #5   Title Improve coordination Rt hand as evidenced by performing 9 hole peg test in 45 sec or less    Baseline 1 min. 45 sec.    Time 12    Period Weeks    Status Achieved   36.56 sec     Long Term Additional  Goals   Additional Long Term Goals Yes      OT LONG TERM GOAL #6   Title Pt to improve grip strength Rt hand to 40 lbs or greater to open jars/containers    Baseline 22 lbs (Lt = 65  lbs)    Time 12    Period Weeks    Status On-going   36 lbs     OT LONG TERM GOAL #7   Title Pt to write name at 90% legibility and 3 sentences at 75% or greater legibility    Time 12    Period Weeks    Status On-going   Pt writing 3 simple sentences in print at 90% legibility     OT LONG TERM GOAL #8   Title Pt to improve coordination Rt hand as evidenced by performing 9 hole peg test in 30 sec or less    Baseline eval =  1 min. 45 sec,  at reassessement = 36.56 sec    Period Weeks    Status New                   Plan - 04/11/21 0930     Clinical Impression Statement Pt making great progress with RUE and has already met LTG for coordination (therefore added new goal for this). Pt continues to req cues for decreased compensations when reaching RUE    OT Occupational Profile and History Detailed Assessment- Review of Records and additional review of physical, cognitive, psychosocial history related to current functional performance    Occupational performance deficits (Please refer to evaluation for details): ADL's;IADL's;Work;Leisure    Body Structure / Function / Physical Skills ADL;Strength;Dexterity;Balance;Tone;Body mechanics;Edema;Proprioception;UE functional use;IADL;ROM;Endurance;Mobility;Coordination;Sensation;FMC;Decreased knowledge of use of DME    Cognitive Skills Attention;Memory;Perception    Rehab Potential Good    Clinical Decision Making Several treatment options, min-mod task modification necessary    Comorbidities Affecting Occupational Performance: May have comorbidities impacting occupational performance    Modification or Assistance to Complete Evaluation  No modification of tasks or assist necessary to complete eval    OT Frequency 2x / week    OT Duration 12 weeks   PLUS EVAL   OT Treatment/Interventions Self-care/ADL training;DME and/or AE instruction;Moist Heat;Therapeutic activities;Compression bandaging;Splinting;Therapeutic  exercise;Ultrasound;Cognitive remediation/compensation;Coping strategies training;Visual/perceptual remediation/compensation;Passive range of motion;Neuromuscular education;Electrical Stimulation;Cryotherapy;Manual Therapy;Patient/family education    Plan make sandwich from standing level, UBE    Consulted and Agree with Plan of Care Patient;Family member/caregiver    Family Member Consulted husband and daughter             Patient will benefit from skilled therapeutic intervention in order to improve the following deficits and impairments:   Body Structure / Function / Physical Skills: ADL, Strength, Dexterity, Balance, Tone, Body mechanics, Edema, Proprioception, UE functional use, IADL, ROM, Endurance, Mobility, Coordination, Sensation, FMC, Decreased knowledge of use of DME Cognitive Skills: Attention, Memory, Perception     Visit Diagnosis: Hemiplegia and hemiparesis following cerebral infarction affecting right dominant side (HCC)  Muscle weakness (generalized)  Unsteadiness on feet  Other lack of coordination    Problem List Patient Active Problem List   Diagnosis Date Noted   Nausea without vomiting    Spastic hemiparesis (HCC)    History of hypertension    Neurogenic orthostatic hypotension (HCC)    Transaminitis    Right hemiplegia (HCC)    Hemorrhagic stroke (HCC)    Leucocytosis    Essential hypertension    Dyslipidemia    Right hemiparesis (HCC)  ICH (intracerebral hemorrhage) ([place]) 12/01/2020   HYPERLIPIDEMIA 07/06/2009   OBESITY 07/06/2009    Carey Bullocks, OTR/L 04/11/2021, 11:20 AM  [facility] [address] [place], [place], [address] Phone: [phone]   Fax:  [phone]  Name: JERMIKA OLDEN MRN: [id number] Date of Birth: Jul 25, 1965

## 2021-04-11 NOTE — Therapy (Signed)
[facility] [address] [place], [place], [address] Phone: [phone]   Fax:  [phone]  Physical Therapy Treatment  Patient Details  Name: Felicia Chen MRN: [id number] Date of Birth: 09-02-1965 Referring Provider (PT): Dr. Dagoberto Ligas   Encounter Date: 04/11/2021   PT End of Session - 04/11/21 0803     Visit Number 13    Number of Visits 25    Date for PT Re-Evaluation 05/17/21    Authorization Type UHC -40 visit limit per discipline?    PT Start Time 0803   Pt arriving late   PT Stop Time 0844    PT Time Calculation (min) 41 min    Equipment Utilized During Treatment Gait belt    Activity Tolerance Patient tolerated treatment well    Behavior During Therapy WFL for tasks assessed/performed             Past Medical History:  Diagnosis Date   Anxiety    Gilbert's syndrome     Past Surgical History:  Procedure Laterality Date   ABDOMINAL HYSTERECTOMY N/A 06/11/2012   Procedure: HYSTERECTOMY ABDOMINAL;  Surgeon: Allena Katz, MD;  Location: [facility];  Service: Gynecology;  Laterality: N/A;   CESAREAN SECTION     x 4   CYSTOSCOPY N/A 06/11/2012   Procedure: CYSTOSCOPY;  Surgeon: Allena Katz, MD;  Location: [facility];  Service: Gynecology;  Laterality: N/A;   HERNIA REPAIR     LAPAROSCOPIC ASSISTED VAGINAL HYSTERECTOMY N/A 06/11/2012   Procedure: LAPAROSCOPIC ASSISTED VAGINAL HYSTERECTOMY atttempted;  Surgeon: Allena Katz, MD;  Location: [facility];  Service: Gynecology;  Laterality: N/A;  Attempted Laparoscopic assisted vaginal hysterectomy.   LAPAROSCOPIC LYSIS OF ADHESIONS N/A 06/11/2012   Procedure: LAPAROSCOPIC LYSIS OF ADHESIONS;  Surgeon: Allena Katz, MD;  Location: [facility];  Service: Gynecology;  Laterality: N/A;   SALPINGOOPHORECTOMY Bilateral 06/11/2012   Procedure: SALPINGO OOPHORECTOMY;  Surgeon: Allena Katz, MD;  Location: [facility];  Service: Gynecology;  Laterality:  Bilateral;    Vitals:   04/11/21 0808  BP: 111/82  Pulse: 100     Subjective Assessment - 04/11/21 0805     Subjective Patient reports had a good weekend. Trying to walk more throughout the house. No falls. No pain, just feeling some stiffness.    Patient is accompained by: Family member   [name], [name]   Pertinent History anxiety, Gilbert's syndrome, obesity, HLD.    Limitations Walking;House hold activities;Standing    How long can you walk comfortably? 10 minutes    Patient Stated Goals wants to get back to normal.    Currently in Pain? No/denies    Pain Onset More than a month ago               [facility] Adult PT Treatment/Exercise - 04/11/21 0001       Transfers   Transfers Sit to Stand;Stand to Sit    Sit to Stand 4: Min guard    Five time sit to stand comments  14.44 secs with UE support    Stand to Sit 5: Supervision    Number of Reps 10 reps;1 set    Transfer Cueing cues for foot position of RLE      Ambulation/Gait   Ambulation/Gait Yes    Ambulation/Gait Assistance 4: Min guard    Ambulation/Gait Assistance Details patient ambulating into/out of session with RW, no use of w/c today.    Ambulation  Distance (Feet) --   clinic distance   Assistive device Rolling walker    Gait Pattern Step-through pattern;Decreased stance time - right;Decreased step length - left;Decreased hip/knee flexion - right;Decreased dorsiflexion - right;Decreased weight shift to right;Poor foot clearance - right;Right foot flat;Wide base of support;Abducted- right    Ambulation Surface Level;Indoor      Standardized Balance Assessment   Standardized Balance Assessment Berg Balance Test      Berg Balance Test   Sit to Stand Able to stand  independently using hands    Standing Unsupported Able to stand safely 2 minutes    Sitting with Back Unsupported but Feet Supported on Floor or Stool Able to sit safely and securely 2 minutes    Stand to Sit Controls descent by using  hands    Transfers Able to transfer safely, definite need of hands    Standing Unsupported with Eyes Closed Able to stand 10 seconds safely    Standing Ubsupported with Feet Together Able to place feet together independently and stand for 1 minute with supervision    From Standing, Reach Forward with Outstretched Arm Can reach confidently >25 cm (10")   11"   From Standing Position, Pick up Object from [place] to pick up shoe safely and easily    From Standing Position, Turn to Look Behind Over each Shoulder Looks behind one side only/other side shows less weight shift    Turn 360 Degrees Needs close supervision or verbal cueing    Standing Unsupported, Alternately Place Feet on Step/Stool Able to complete 4 steps without aid or supervision    Standing Unsupported, One Foot in Front Able to take small step independently and hold 30 seconds    Standing on One Leg Tries to lift leg/unable to hold 3 seconds but remains standing independently    Total Score 41      Knee/Hip Exercises: Aerobic   Stepper SciFit with BUE/BLE for 6 minutes at gear 2.0 for strengthening, activity tolerance. Pt tolerating increase in resistance well. Use of gait belt around thighs to help keeps legs in neutral position.                 PT Short Term Goals - 04/11/21 3329       PT SHORT TERM GOAL #1   Title Pt will be independent with initial HEP in with family supervision in order to build upon functional gains made in therapy. ALL STGS DUE 03/17/21    Baseline family not present today to review HEP, but pt reports that performing consistently at home, will benefit from family review in future session    Time 4    Period Weeks    Status Deferred    Target Date 03/17/21      PT SHORT TERM GOAL #2   Title Pt will undergo further assessment of BERG with LTG written as appropriate.    Baseline 36/56 initial assessment; 41/56    Time 4    Period Weeks    Status Achieved      PT SHORT TERM GOAL #3    Title Pt will improve gait speed to at least 1.25 ft/sec in order to demo improved household mobility.    Baseline .90 ft/sec with RW; 1.96 ft/sec    Time 4    Period Weeks    Status Achieved      PT SHORT TERM GOAL #4   Title Pt will ambulate at least 230' with RW and supervision in order  to demo improved household mobility.    Baseline 230 w/ RW and CGA/MinA    Time 4    Period Weeks    Status Partially Met      PT SHORT TERM GOAL #5   Title Pt will perform 5x sit <> stand with UE support in 21 seconds or less with supervision in order to demo decr fall risk.    Baseline 23.69 seconds with min guard; 19.59 secs with single UE support;    Time 4    Period Weeks    Status Achieved               PT Long Term Goals - 04/11/21 6222       PT LONG TERM GOAL #1   Title Pt will be independent with final HEP in with family supervision in order to build upon functional gains made in therapy. ALL LTGS DUE 04/14/21    Time 8    Period Weeks    Status New    Target Date 04/14/21      PT LONG TERM GOAL #2   Title BERG goal written to demo decr fall risk.    Baseline 41/56    Time 8    Period Weeks    Status On-going      PT LONG TERM GOAL #3   Title Pt will ambulate at least 300' outdoors with RW and supervision in order to demo improved community mobility.    Time 8    Period Weeks    Status New      PT LONG TERM GOAL #4   Title Pt will go up and down 12 stairs with single handrail with supervision/min guard with step to pattern in order to safely go up/down the stairs in her house.    Baseline not yet assessed.    Time 8    Period Weeks    Status New      PT LONG TERM GOAL #5   Title Pt will perform 5x sit <> stand with UE support in 16 seconds or less with supervision in order to demo decr fall risk.    Baseline 23.69 seconds with min guard; 14.44 secs with UE support    Time 8    Period Weeks    Status Achieved      PT LONG TERM GOAL #6   Title Pt will improve  gait speed to at least 1.9 ft/sec in order to demo improved household mobility and decr fall risk.    Baseline .90 ft/sec with RW    Time 8    Period Weeks    Status New                   Plan - 04/11/21 9798     Clinical Impression Statement Began assesment of patient's progress toward LTGs. Completed Berg Balance during session, with patient scoring 41/56 demonstrating increased fall risk but improvements in balance. Patient able to complete 5x sit <> stand in 14.44 seconds. Patient able to ambulate into session with RW with no issues noted. Will continue to progress towards LTGs.    Personal Factors and Comorbidities Comorbidity 3+;Past/Current Experience;Time since onset of injury/illness/exacerbation    Comorbidities anxiety, Gilbert's syndrome, obesity, HLD    Examination-Activity Limitations Bathing;Bend;Caring for Others;Toileting;Stand;Stairs;Squat;Reach Overhead;Locomotion Level;Transfers    Examination-Participation Restrictions Cleaning;Community Activity;Driving;Medication Management    Stability/Clinical Decision Making Evolving/Moderate complexity    Rehab Potential Good    PT Frequency 2x / week  PT Duration 12 weeks    PT Treatment/Interventions ADLs/Self Care Home Management;Electrical Stimulation;DME Instruction;Gait training;Stair training;Functional mobility training;Therapeutic activities;Aquatic Therapy;Therapeutic exercise;Balance training;Neuromuscular re-education;Manual techniques;Orthotic Fit/Training;Patient/family education;Passive range of motion;Energy conservation;Vestibular;Visual/perceptual remediation/compensation    PT Next Visit Plan Finishing checkings LTGs.  tall kneeling activities, go down on side. RLE NMR and strengthening (esp R hip). sit <> stands/functional strength with incr RLE weight bearing. Gait training with RW - [place] PLS AFO (pt will be getting a custom AFO from [employer]) , Continue to practice stairs. SciFit    PT Home Exercise  Plan [id number]    Consulted and Agree with Plan of Care Patient;Family member/caregiver             Patient will benefit from skilled therapeutic intervention in order to improve the following deficits and impairments:  Abnormal gait, Decreased activity tolerance, Decreased balance, Decreased cognition, Decreased endurance, Decreased coordination, Decreased range of motion, Decreased safety awareness, Decreased strength, Hypomobility, Increased edema, Impaired sensation, Postural dysfunction, Impaired flexibility, Impaired tone, Difficulty walking, Impaired UE functional use, Impaired vision/preception  Visit Diagnosis: Unsteadiness on feet  Hemiplegia and hemiparesis following cerebral infarction affecting right dominant side (HCC)  Muscle weakness (generalized)  Other abnormalities of gait and mobility     Problem List Patient Active Problem List   Diagnosis Date Noted   Nausea without vomiting    Spastic hemiparesis (HCC)    History of hypertension    Neurogenic orthostatic hypotension (HCC)    Transaminitis    Right hemiplegia ([place])    Hemorrhagic stroke (HCC)    Leucocytosis    Essential hypertension    Dyslipidemia    Right hemiparesis (HCC)    ICH (intracerebral hemorrhage) ([place]) 12/01/2020   HYPERLIPIDEMIA 07/06/2009   OBESITY 07/06/2009    Jones Bales, PT, DPT 04/11/2021, 9:12 AM  [facility] [address] [place], [place], [address] Phone: [phone]   Fax:  [phone]  Name: Felicia Chen MRN: [id number] Date of Birth: 1965-12-09

## 2021-04-13 ENCOUNTER — Encounter: Payer: 59 | Admitting: Occupational Therapy

## 2021-04-13 ENCOUNTER — Encounter: Payer: 59 | Admitting: Speech Pathology

## 2021-04-13 ENCOUNTER — Ambulatory Visit: Payer: 59 | Admitting: Physical Therapy

## 2021-04-14 ENCOUNTER — Ambulatory Visit: Payer: 59

## 2021-04-14 ENCOUNTER — Other Ambulatory Visit: Payer: Self-pay

## 2021-04-14 ENCOUNTER — Ambulatory Visit: Payer: 59 | Admitting: Occupational Therapy

## 2021-04-14 DIAGNOSIS — R2681 Unsteadiness on feet: Secondary | ICD-10-CM

## 2021-04-14 DIAGNOSIS — R278 Other lack of coordination: Secondary | ICD-10-CM

## 2021-04-14 DIAGNOSIS — M6281 Muscle weakness (generalized): Secondary | ICD-10-CM | POA: Diagnosis not present

## 2021-04-14 DIAGNOSIS — I69351 Hemiplegia and hemiparesis following cerebral infarction affecting right dominant side: Secondary | ICD-10-CM

## 2021-04-14 DIAGNOSIS — R2689 Other abnormalities of gait and mobility: Secondary | ICD-10-CM

## 2021-04-14 NOTE — Therapy (Signed)
[facility] [address] [place], [place], [address] Phone: [phone]   Fax:  [phone]  Occupational Therapy Treatment  Patient Details  Name: Felicia Chen MRN: [id number] Date of Birth: [birth year] Referring Provider (OT): Dr. Courtney Heys   Encounter Date: 04/14/2021   OT End of Session - 04/14/21 0852     Visit Number 15    Number of Visits 25    Date for OT Re-Evaluation 05/17/21    Authorization Type UHC: VL 40 per discipline, no auth required    OT Start Time 0810   pt arrived late   OT Stop Time 0845    OT Time Calculation (min) 35 min    Activity Tolerance Patient tolerated treatment well    Behavior During Therapy [facility] for tasks assessed/performed             Past Medical History:  Diagnosis Date   Anxiety    Gilbert's syndrome     Past Surgical History:  Procedure Laterality Date   ABDOMINAL HYSTERECTOMY N/A 06/11/2012   Procedure: HYSTERECTOMY ABDOMINAL;  Surgeon: Allena Katz, MD;  Location: [facility];  Service: Gynecology;  Laterality: N/A;   CESAREAN SECTION     x 4   CYSTOSCOPY N/A 06/11/2012   Procedure: CYSTOSCOPY;  Surgeon: Allena Katz, MD;  Location: [facility];  Service: Gynecology;  Laterality: N/A;   HERNIA REPAIR     LAPAROSCOPIC ASSISTED VAGINAL HYSTERECTOMY N/A 06/11/2012   Procedure: LAPAROSCOPIC ASSISTED VAGINAL HYSTERECTOMY atttempted;  Surgeon: Allena Katz, MD;  Location: [facility];  Service: Gynecology;  Laterality: N/A;  Attempted Laparoscopic assisted vaginal hysterectomy.   LAPAROSCOPIC LYSIS OF ADHESIONS N/A 06/11/2012   Procedure: LAPAROSCOPIC LYSIS OF ADHESIONS;  Surgeon: Allena Katz, MD;  Location: [facility];  Service: Gynecology;  Laterality: N/A;   SALPINGOOPHORECTOMY Bilateral 06/11/2012   Procedure: SALPINGO OOPHORECTOMY;  Surgeon: Allena Katz, MD;  Location: [facility];  Service: Gynecology;  Laterality: Bilateral;    There were no vitals  filed for this visit.   Subjective Assessment - 04/14/21 0848     Subjective  I've really improved in my hand    Patient is accompanied by: Family member    Pertinent History ICH 12/01/20 Lt frontal/parietal lobes w/ SAH extending over bilat cerebral hemispheres and cerebellum, Lt temporal lobe ischemic CVA w/ residual Rt hemiplegia. PMH: HLD, anxiety, covid-19, Gilbert's syndrome    Limitations fall risk    Patient Stated Goals get back to normal    Currently in Pain? No/denies             Unable to make sandwich today (out of bread) however simulated task from standing level - pt required CGA and mod cueing for safety/fall prevention (walker negotiation and placement, reminders for one hand counter top support or leaning hips against counter, hand placement, turning body to face cabinet, getting closer prn, etc)  UBE x 6 min. Level 1 for normal reciprocal movement pattern w/ min cues to prevent compensation  Pt placing tee pegs in pegboard Rt hand then removing 3 at a time for in hand manipulation w/ min difficulty                       OT Short Term Goals - 04/04/21 0907       OT SHORT TERM GOAL #1   Title Independent with HEP for RUE shoulder ROM, hand coordination and hand strength  Time 4    Period Weeks    Status Achieved    Target Date 03/16/21      OT SHORT TERM GOAL #2   Title Pt to perform all BADLS with no more than min assist    Time 4    Period Weeks    Status Achieved      OT SHORT TERM GOAL #3   Title Pt to write name and address with 50% or greater legibility    Time 4    Period Weeks    Status Achieved   90% legibility w/ extra time     OT SHORT TERM GOAL #4   Title Pt to prepare simple snack, sandwich, and microwaveable items consistently using DME prn with distant supervision only    Time 4    Period Weeks    Status On-going   pt req'd close supervision d/t unsteadiness with standing at counter for reaching items and managing  refrigerator     OT SHORT TERM GOAL #5   Title Pt to improve coordination as evidenced by performing 9 hole peg test Rt hand in under 1 min. and 15 sec    Baseline 1 min. 45 sec    Time 4    Period Weeks    Status Achieved   52.87 sec     OT SHORT TERM GOAL #6   Title Pt to grip strength Rt hand to 30 lbs or greater in prep for opening jars    Baseline 22 lbs (Lt = 65 lbs)    Time 4    Period Weeks    Status On-going   24.4 lbs              OT Long Term Goals - 04/11/21 0911       OT LONG TERM GOAL #1   Title Independent with strengthening HEP for Rt shoulder    Time 12    Period Weeks    Status On-going      OT LONG TERM GOAL #2   Title Pt to fully attend to Rt side and return to using Rt hand as dominant hand 90% of the time safely for ADLS w/ task modifications prn    Time 12    Period Weeks    Status On-going      OT LONG TERM GOAL #3   Title Independent with all BADLS    Time 12    Period Weeks    Status On-going   assist w/ shower transfer and hooking bra     OT LONG TERM GOAL #4   Title Pt to perform simple cooking tasks with supervision/min cueing only    Time 12    Period Weeks    Status New      OT LONG TERM GOAL #5   Title Improve coordination Rt hand as evidenced by performing 9 hole peg test in 45 sec or less    Baseline 1 min. 45 sec.    Time 12    Period Weeks    Status Achieved   36.56 sec     Long Term Additional Goals   Additional Long Term Goals Yes      OT LONG TERM GOAL #6   Title Pt to improve grip strength Rt hand to 40 lbs or greater to open jars/containers    Baseline 22 lbs (Lt = 65 lbs)    Time 12    Period Weeks    Status On-going   36  lbs     OT LONG TERM GOAL #7   Title Pt to write name at 90% legibility and 3 sentences at 75% or greater legibility    Time 12    Period Weeks    Status On-going   Pt writing 3 simple sentences in print at 90% legibility     OT LONG TERM GOAL #8   Title Pt to improve coordination Rt  hand as evidenced by performing 9 hole peg test in 30 sec or less    Baseline eval =  1 min. 45 sec,  at reassessement = 36.56 sec    Period Weeks    Status New                   Plan - 04/14/21 0853     Clinical Impression Statement Pt progressing with snack/sandwich prep from standing level w/ cues and CGA for safety    OT Occupational Profile and History Detailed Assessment- Review of Records and additional review of physical, cognitive, psychosocial history related to current functional performance    Occupational performance deficits (Please refer to evaluation for details): ADL's;IADL's;Work;Leisure    Body Structure / Function / Physical Skills ADL;Strength;Dexterity;Balance;Tone;Body mechanics;Edema;Proprioception;UE functional use;IADL;ROM;Endurance;Mobility;Coordination;Sensation;FMC;Decreased knowledge of use of DME    Cognitive Skills Attention;Memory;Perception    Rehab Potential Good    Clinical Decision Making Several treatment options, min-mod task modification necessary    Comorbidities Affecting Occupational Performance: May have comorbidities impacting occupational performance    Modification or Assistance to Complete Evaluation  No modification of tasks or assist necessary to complete eval    OT Frequency 2x / week    OT Duration 12 weeks   PLUS EVAL   OT Treatment/Interventions Self-care/ADL training;DME and/or AE instruction;Moist Heat;Therapeutic activities;Compression bandaging;Splinting;Therapeutic exercise;Ultrasound;Cognitive remediation/compensation;Coping strategies training;Visual/perceptual remediation/compensation;Passive range of motion;Neuromuscular education;Electrical Stimulation;Cryotherapy;Manual Therapy;Patient/family education    Plan if bread available - make sandwich, RUE functional reaching and NMR Rt shoulder    Consulted and Agree with Plan of Care Patient;Family member/caregiver    Family Member Consulted husband and daughter              Patient will benefit from skilled therapeutic intervention in order to improve the following deficits and impairments:   Body Structure / Function / Physical Skills: ADL, Strength, Dexterity, Balance, Tone, Body mechanics, Edema, Proprioception, UE functional use, IADL, ROM, Endurance, Mobility, Coordination, Sensation, FMC, Decreased knowledge of use of DME Cognitive Skills: Attention, Memory, Perception     Visit Diagnosis: Hemiplegia and hemiparesis following cerebral infarction affecting right dominant side (HCC)  Unsteadiness on feet  Other lack of coordination    Problem List Patient Active Problem List   Diagnosis Date Noted   Nausea without vomiting    Spastic hemiparesis (HCC)    History of hypertension    Neurogenic orthostatic hypotension (HCC)    Transaminitis    Right hemiplegia ([place])    Hemorrhagic stroke ([place])    Leucocytosis    Essential hypertension    Dyslipidemia    Right hemiparesis ([place])    ICH (intracerebral hemorrhage) ([place]) 12/01/2020   HYPERLIPIDEMIA 07/06/2009   OBESITY 07/06/2009    Carey Bullocks, OTR/L 04/14/2021, 8:56 AM  [facility] [address] [place], [place], [address] Phone: [phone]   Fax:  [phone]  Name: Felicia Chen MRN: [id number] Date of Birth: [birth year]

## 2021-04-14 NOTE — Therapy (Signed)
[facility] [address] [place], [place], [address] Phone: [phone]   Fax:  [phone]  Physical Therapy Treatment/Re-Certification  Patient Details  Name: SOPHEE MCKIMMY MRN: [id number] Date of Birth: 1965/09/02 Referring Provider (PT): Dr. Dagoberto Ligas    Encounter Date: 04/14/2021   PT End of Session - 04/14/21 0845     Visit Number 14    Number of Visits 30    Date for PT Re-Evaluation 06/10/21    Authorization Type [employer] -40 visit limit per discipline?    PT Start Time [phone]    PT Stop Time 0928    PT Time Calculation (min) 42 min    Equipment Utilized During Treatment Gait belt    Activity Tolerance Patient tolerated treatment well    Behavior During Therapy WFL for tasks assessed/performed             Past Medical History:  Diagnosis Date   Anxiety    Gilbert's syndrome     Past Surgical History:  Procedure Laterality Date   ABDOMINAL HYSTERECTOMY N/A 06/11/2012   Procedure: HYSTERECTOMY ABDOMINAL;  Surgeon: Allena Katz, MD;  Location: [facility];  Service: Gynecology;  Laterality: N/A;   CESAREAN SECTION     x 4   CYSTOSCOPY N/A 06/11/2012   Procedure: CYSTOSCOPY;  Surgeon: Allena Katz, MD;  Location: [facility];  Service: Gynecology;  Laterality: N/A;   HERNIA REPAIR     LAPAROSCOPIC ASSISTED VAGINAL HYSTERECTOMY N/A 06/11/2012   Procedure: LAPAROSCOPIC ASSISTED VAGINAL HYSTERECTOMY atttempted;  Surgeon: Allena Katz, MD;  Location: [facility];  Service: Gynecology;  Laterality: N/A;  Attempted Laparoscopic assisted vaginal hysterectomy.   LAPAROSCOPIC LYSIS OF ADHESIONS N/A 06/11/2012   Procedure: LAPAROSCOPIC LYSIS OF ADHESIONS;  Surgeon: Allena Katz, MD;  Location: [facility];  Service: Gynecology;  Laterality: N/A;   SALPINGOOPHORECTOMY Bilateral 06/11/2012   Procedure: SALPINGO OOPHORECTOMY;  Surgeon: Allena Katz, MD;  Location: [facility];  Service: Gynecology;  Laterality:  Bilateral;    There were no vitals filed for this visit.   Subjective Assessment - 04/14/21 0847     Subjective No changes. No falls, Reports her apple watch tracked her watching a mile.    Patient is accompained by: Family member   daughther [name], sister [name]   Pertinent History anxiety, Gilbert's syndrome, obesity, HLD.    Limitations Walking;House hold activities;Standing    How long can you walk comfortably? 10 minutes    Patient Stated Goals wants to get back to normal.    Currently in Pain? No/denies    Pain Onset More than a month ago              [facility] Adult PT Treatment/Exercise - 04/14/21 0001       Transfers   Transfers Sit to Stand;Stand to Sit    Sit to Stand 4: Min guard    Stand to Sit 5: Supervision    Comments completed x 5 reps throughout session, patient doing better with getting RLE aligned prior to standing.      Ambulation/Gait   Ambulation/Gait Yes    Ambulation/Gait Assistance 4: Min guard;5: Supervision    Ambulation/Gait Assistance Details Completed ambulation outdoors on unlevel surfaces x 450 ft plus additional ambulation distances indoors. CGA/Superivsion throughout with ambulation. Cues for improved hip/knee flexion and trying to land on heel.    Ambulation Distance (Feet) 450 Feet    Assistive device Rolling walker  Gait Pattern Step-through pattern;Decreased stance time - right;Decreased step length - left;Decreased hip/knee flexion - right;Decreased dorsiflexion - right;Decreased weight shift to right;Poor foot clearance - right;Right foot flat;Wide base of support;Abducted- right    Ambulation Surface Level;Indoor;Unlevel;Outdoor;Paved    Gait velocity 18.92 secs = 1.73 ft/sec    Stairs Yes    Stairs Assistance 4: Min guard    Stairs Assistance Details (indicate cue type and reason) able to ascend/descend stairs with Bil rails and step to pattern, continue to catch R toe at times on the edge of step x 12 steps. CGA    Stair Management  Technique Two rails;Step to pattern;Forwards    Number of Stairs 12    Height of Stairs 6      Exercises   Exercises Knee/Hip    Other Exercises  reviewed HEP (see updated medbridge program)             Reviewed Entire HEP and Updated:   Access Code: [email or web address] URL: [email or web address] Date: 04/14/2021 Prepared by: Baldomero Lamy   Exercises Supine Bridge with [employer] Between Knees - 2 x daily - 5 x weekly - 1-2 sets - 10 reps Seated Hip Adduction Isometrics with Ball - 2 x daily - 5 x weekly - 1-2 sets - 10 reps Bent Knee Fallouts (Mirrored) - 2 x daily - 5 x weekly - 1-2 sets - 10 reps Supine Hip Flexion (Mirrored) - 2 x daily - 5 x weekly - 1-2 sets - 10 reps Sit to Stand with Armchair - 2 x daily - 5 x weekly - 1 sets - 10 reps Mini Squat with Counter Support - 2 x daily - 5 x weekly - 1-2 sets - 10 reps    PT Education - 04/14/21 0926     Education Details Progress toward LTGs; Updated HEP    Person(s) Educated Patient    Methods Explanation    Comprehension Verbalized understanding              PT Short Term Goals - 04/14/21 1111       PT SHORT TERM GOAL #1   Title Pt will be independent with progressive HEP in order to build upon functional gains made in therapy (ALL STGs Due: 05/12/21)    Baseline family not present today to review HEP, but pt reports that performing consistently at home, will benefit from family review in future session    Time 4    Period Weeks    Status New    Target Date 05/12/21      PT SHORT TERM GOAL #2   Title Patient will improve Berg Balance to >/= 43/56 to demo improved balance and reduced fall risk    Baseline 36/56 initial assessment; 41/56    Time 4    Period Weeks    Status Revised      PT SHORT TERM GOAL #3   Title Pt will improve gait speed to at least 1.85 ft/sec in order to demo improved household mobility.    Baseline .90 ft/sec with RW; 1.73 ft/sec    Time 4    Period Weeks    Status  Revised      PT SHORT TERM GOAL #4   Title Pt will ambulate at least >/= 600 ft with RW and supervision in order to demo improved household mobility.    Baseline 230 w/ RW and CGA/MinA    Time 4    Period Weeks    Status Revised  PT SHORT TERM GOAL #5   Title Pt will initiate Aquatic PT services and initiate Aquatic HEP    Baseline Referral for Aquatic placed    Time 4    Period Weeks    Status New               PT Long Term Goals - 04/14/21 1115       PT LONG TERM GOAL #1   Title Pt will be independent with land and aquatic HEP in with family supervision in order to build upon functional gains made in therapy. ALL LTGS DUE 06/10/21    Baseline independent with current HEP; will benefit from progressive HEP    Time 8    Period Weeks    Status Revised    Target Date 06/10/21      PT LONG TERM GOAL #2   Title Patient will improve Berg Balance to >/= 46/56 to demo reduced fall risk and imbalance    Baseline 41/56    Time 8    Period Weeks    Status Revised      PT LONG TERM GOAL #3   Title Pt will ambulate at least 500' outdoors with LRAD and supervision in order to demo improved community mobility.    Baseline 450' outdors with RW CGA/supervision    Time 8    Period Weeks    Status Revised      PT LONG TERM GOAL #4   Title Pt will go up and down 12 stairs with single handrail with supervision/min guard with step to pattern in order to safely go up/down the stairs in her house.    Baseline not yet assessed; 12 stairs with CGA/supervision bil rails    Time 8    Period Weeks    Status On-going      PT LONG TERM GOAL #5   Title Pt will perform 5x sit <> stand with UE support in 12 seconds or less with supervision in order to demo decr fall risk.    Baseline 23.69 seconds with min guard; 14.44 secs with UE support    Time 8    Period Weeks    Status Revised      PT LONG TERM GOAL #6   Title Pt will improve gait speed to at least 2.0 ft/sec in order to demo  improved household mobility and decr fall risk.    Baseline .53 ft/sec with RW; 1.73 ft/sec    Time 8    Period Weeks    Status Revised            Updated Short Term Goals:   PT Short Term Goals - 04/14/21 1111       PT SHORT TERM GOAL #1   Title Pt will be independent with progressive HEP in order to build upon functional gains made in therapy (ALL STGs Due: 05/12/21)    Baseline family not present today to review HEP, but pt reports that performing consistently at home, will benefit from family review in future session    Time 4    Period Weeks    Status New    Target Date 05/12/21      PT SHORT TERM GOAL #2   Title Patient will improve Berg Balance to >/= 43/56 to demo improved balance and reduced fall risk    Baseline 36/56 initial assessment; 41/56    Time 4    Period Weeks    Status Revised  PT SHORT TERM GOAL #3   Title Pt will improve gait speed to at least 1.85 ft/sec in order to demo improved household mobility.    Baseline .90 ft/sec with RW; 1.73 ft/sec    Time 4    Period Weeks    Status Revised      PT SHORT TERM GOAL #4   Title Pt will ambulate at least >/= 600 ft with RW and supervision in order to demo improved household mobility.    Baseline 230 w/ RW and CGA/MinA    Time 4    Period Weeks    Status Revised      PT SHORT TERM GOAL #5   Title Pt will initiate Aquatic PT services and initiate Aquatic HEP    Baseline Referral for Aquatic placed    Time 4    Period Weeks    Status New               Updated Long Term Goals:   PT Long Term Goals - 04/14/21 1115       PT LONG TERM GOAL #1   Title Pt will be independent with land and aquatic HEP in with family supervision in order to build upon functional gains made in therapy. ALL LTGS DUE 06/10/21    Baseline independent with current HEP; will benefit from progressive HEP    Time 8    Period Weeks    Status Revised    Target Date 06/10/21      PT LONG TERM GOAL #2   Title Patient  will improve Berg Balance to >/= 46/56 to demo reduced fall risk and imbalance    Baseline 41/56    Time 8    Period Weeks    Status Revised      PT LONG TERM GOAL #3   Title Pt will ambulate at least 500' outdoors with LRAD and supervision in order to demo improved community mobility.    Baseline 450' outdors with RW CGA/supervision    Time 8    Period Weeks    Status Revised      PT LONG TERM GOAL #4   Title Pt will go up and down 12 stairs with single handrail with supervision/min guard with step to pattern in order to safely go up/down the stairs in her house.    Baseline not yet assessed; 12 stairs with CGA/supervision bil rails    Time 8    Period Weeks    Status On-going      PT LONG TERM GOAL #5   Title Pt will perform 5x sit <> stand with UE support in 12 seconds or less with supervision in order to demo decr fall risk.    Baseline 23.69 seconds with min guard; 14.44 secs with UE support    Time 8    Period Weeks    Status Revised      PT LONG TERM GOAL #6   Title Pt will improve gait speed to at least 2.0 ft/sec in order to demo improved household mobility and decr fall risk.    Baseline .90 ft/sec with RW; 1.73 ft/sec    Time 8    Period Weeks    Status Revised                   Plan - 04/14/21 1015     Clinical Impression Statement Finished assesment toward LTG. Patient demo progress with improvements noted in gait speed to 1.73 ft/sec. Patient also  able to complete stairs and gait outdoors with CGA. Paitent cotinues to demo progress with PT services and will continue to benefit from skilled PT services to improve functional mobilty and return to PLOF.    Personal Factors and Comorbidities Comorbidity 3+;Past/Current Experience;Time since onset of injury/illness/exacerbation    Comorbidities anxiety, Gilbert's syndrome, obesity, HLD    Examination-Activity Limitations Bathing;[facility];Caring for Others;Toileting;Stand;Stairs;Squat;Reach Overhead;Locomotion  Level;Transfers    Examination-Participation Restrictions Cleaning;Community Activity;Driving;Medication Management    Stability/Clinical Decision Making Evolving/Moderate complexity    Rehab Potential Good    PT Frequency 2x / week    PT Duration 8 weeks    PT Treatment/Interventions ADLs/Self Care Home Management;Electrical Stimulation;DME Instruction;Gait training;Stair training;Functional mobility training;Therapeutic activities;Aquatic Therapy;Therapeutic exercise;Balance training;Neuromuscular re-education;Manual techniques;Orthotic Fit/Training;Patient/family education;Passive range of motion;Energy conservation;Vestibular;Visual/perceptual remediation/compensation    PT Next Visit Plan Update from aquatics?  tall kneeling activities, go down on side. RLE NMR and strengthening (esp R hip). sit <> stands/functional strength with incr RLE weight bearing. Gait training with RW - [place] PLS AFO (pt will be getting a custom AFO from [employer]) , Continue to practice stairs. Sci[employer]    PT Home Exercise Plan [id number]    Consulted and Agree with Plan of Care Patient;Family member/caregiver             Patient will benefit from skilled therapeutic intervention in order to improve the following deficits and impairments:  Abnormal gait, Decreased activity tolerance, Decreased balance, Decreased cognition, Decreased endurance, Decreased coordination, Decreased range of motion, Decreased safety awareness, Decreased strength, Hypomobility, Increased edema, Impaired sensation, Postural dysfunction, Impaired flexibility, Impaired tone, Difficulty walking, Impaired UE functional use, Impaired vision/preception  Visit Diagnosis: Hemiplegia and hemiparesis following cerebral infarction affecting right dominant side (HCC)  Muscle weakness (generalized)  Unsteadiness on feet  Other abnormalities of gait and mobility     Problem List Patient Active Problem List   Diagnosis Date Noted   Nausea  without vomiting    Spastic hemiparesis (HCC)    History of hypertension    Neurogenic orthostatic hypotension (HCC)    Transaminitis    Right hemiplegia ([place])    Hemorrhagic stroke (HCC)    Leucocytosis    Essential hypertension    Dyslipidemia    Right hemiparesis (HCC)    ICH (intracerebral hemorrhage) ([place]) 12/01/2020   HYPERLIPIDEMIA 07/06/2009   OBESITY 07/06/2009    Jones Bales, PT, DPT 04/14/2021, 11:18 AM  [facility] [address] [place], [place], [address] Phone: [phone]   Fax:  [phone]  Name: LEINANI LISBON MRN: [id number] Date of Birth: [birth year]

## 2021-04-14 NOTE — Patient Instructions (Signed)
Access Code: [email or web address] URL: [email or web address] Date: 04/14/2021 Prepared by: Baldomero Lamy  Exercises Supine Bridge with [employer] Between Knees - 2 x daily - 5 x weekly - 1-2 sets - 10 reps Seated Hip Adduction Isometrics with Ball - 2 x daily - 5 x weekly - 1-2 sets - 10 reps Bent Knee Fallouts (Mirrored) - 2 x daily - 5 x weekly - 1-2 sets - 10 reps Supine Hip Flexion (Mirrored) - 2 x daily - 5 x weekly - 1-2 sets - 10 reps Sit to Stand with Armchair - 2 x daily - 5 x weekly - 1 sets - 10 reps Mini Squat with Counter Support - 2 x daily - 5 x weekly - 1-2 sets - 10 reps

## 2021-04-18 ENCOUNTER — Other Ambulatory Visit: Payer: Self-pay

## 2021-04-18 ENCOUNTER — Ambulatory Visit: Payer: 59

## 2021-04-18 ENCOUNTER — Ambulatory Visit: Payer: 59 | Admitting: Occupational Therapy

## 2021-04-18 ENCOUNTER — Encounter: Payer: 59 | Admitting: Speech Pathology

## 2021-04-18 DIAGNOSIS — M6281 Muscle weakness (generalized): Secondary | ICD-10-CM

## 2021-04-18 DIAGNOSIS — I69351 Hemiplegia and hemiparesis following cerebral infarction affecting right dominant side: Secondary | ICD-10-CM

## 2021-04-18 DIAGNOSIS — R2681 Unsteadiness on feet: Secondary | ICD-10-CM

## 2021-04-18 DIAGNOSIS — R2689 Other abnormalities of gait and mobility: Secondary | ICD-10-CM

## 2021-04-18 NOTE — Therapy (Signed)
[facility] [address] [place], [place], [address] Phone: [phone]   Fax:  [phone]  Physical Therapy Treatment  Patient Details  Name: Felicia Chen MRN: [id number] Date of Birth: Dec 30, 1965 Referring Provider (PT): Dr. Dagoberto Ligas   Encounter Date: 04/18/2021   PT End of Session - 04/18/21 0905     Visit Number 15    Number of Visits 30    Date for PT Re-Evaluation 06/10/21    Authorization Type UHC -40 visit limit per discipline?    PT Start Time [id number]   Pt arriving late   PT Stop Time 0930    PT Time Calculation (min) 27 min    Equipment Utilized During Treatment Gait belt    Activity Tolerance Patient tolerated treatment well    Behavior During Therapy WFL for tasks assessed/performed             Past Medical History:  Diagnosis Date   Anxiety    Gilbert's syndrome     Past Surgical History:  Procedure Laterality Date   ABDOMINAL HYSTERECTOMY N/A 06/11/2012   Procedure: HYSTERECTOMY ABDOMINAL;  Surgeon: Allena Katz, MD;  Location: [facility];  Service: Gynecology;  Laterality: N/A;   CESAREAN SECTION     x 4   CYSTOSCOPY N/A 06/11/2012   Procedure: CYSTOSCOPY;  Surgeon: Allena Katz, MD;  Location: [facility];  Service: Gynecology;  Laterality: N/A;   HERNIA REPAIR     LAPAROSCOPIC ASSISTED VAGINAL HYSTERECTOMY N/A 06/11/2012   Procedure: LAPAROSCOPIC ASSISTED VAGINAL HYSTERECTOMY atttempted;  Surgeon: Allena Katz, MD;  Location: [facility];  Service: Gynecology;  Laterality: N/A;  Attempted Laparoscopic assisted vaginal hysterectomy.   LAPAROSCOPIC LYSIS OF ADHESIONS N/A 06/11/2012   Procedure: LAPAROSCOPIC LYSIS OF ADHESIONS;  Surgeon: Allena Katz, MD;  Location: [facility];  Service: Gynecology;  Laterality: N/A;   SALPINGOOPHORECTOMY Bilateral 06/11/2012   Procedure: SALPINGO OOPHORECTOMY;  Surgeon: Allena Katz, MD;  Location: [facility];  Service: Gynecology;  Laterality:  Bilateral;    There were no vitals filed for this visit.   Subjective Assessment - 04/18/21 1020     Subjective Patint arriving late. Tearful due to lack of sleep and mental frustration/anxiety. No pain.    Patient is accompained by: Family member   daughther [name], sister [name]   Pertinent History anxiety, Gilbert's syndrome, obesity, HLD.    Limitations Walking;House hold activities;Standing    How long can you walk comfortably? 10 minutes    Patient Stated Goals wants to get back to normal.    Currently in Pain? No/denies    Pain Onset More than a month ago               [facility] Adult PT Treatment/Exercise - 04/18/21 0001       Transfers   Transfers Sit to Stand;Stand to Sit    Sit to Stand 5: Supervision    Stand to Sit 5: Supervision    Number of Reps 2 sets;Other reps (comment)   5 reps   Comments completed with RLE slightly posterior for strengthening, completed x 5 reps with single UE support, then x 5 reps without UE suppport      Ambulation/Gait   Ambulation/Gait Yes    Ambulation/Gait Assistance 5: Supervision    Ambulation/Gait Assistance Details ambulation into/out of therapy session with RW    Assistive device Rolling walker    Gait Pattern Step-through pattern;Decreased stance time - right;Decreased step  length - left;Decreased hip/knee flexion - right;Decreased dorsiflexion - right;Decreased weight shift to right;Poor foot clearance - right;Right foot flat;Wide base of support;Abducted- right    Ambulation Surface Level;Indoor      Self-Care   Self-Care Other Self-Care Comments    Other Self-Care Comments  PT educating on what to expect handout for Aquatic. Patient denies questions/concerns at this time.      Neuro Re-ed    Neuro Re-ed Details  with patient supine on mat: completed PNF D1 pattern with RLE 2 x 5 reps, with PT providing resistance into extension/flexion based upon patient's tolerance. increased challenge with flexion > extension. Cues for  sequencing breahting and to avoid holding breath.      Exercises   Exercises Other Exercises    Other Exercises  Reviewed HEP additions of hooklying marching with addition of green tband, band help to keep RLE in alignment and improved strengthening x 10 reps. Then trialed alteranting bent knee fall outs with green tband x 10 reps, most challenge noted with maintaining RLE stable with hip abduction of LLE. Cues to push/bear down into RLE with active movement. Denie updated handout.                PT Education - 04/18/21 0932     Education Details HEP Update (Green Band with Exercises); Aquatic PT Handout    Person(s) Educated Patient;Other (comment)   Sister   Methods Explanation;Demonstration;Handout    Comprehension Returned demonstration;Verbalized understanding              PT Short Term Goals - 04/14/21 1111       PT SHORT TERM GOAL #1   Title Pt will be independent with progressive HEP in order to build upon functional gains made in therapy (ALL STGs Due: 05/12/21)    Baseline family not present today to review HEP, but pt reports that performing consistently at home, will benefit from family review in future session    Time 4    Period Weeks    Status New    Target Date 05/12/21      PT SHORT TERM GOAL #2   Title Patient will improve Berg Balance to >/= 43/56 to demo improved balance and reduced fall risk    Baseline 36/56 initial assessment; 41/56    Time 4    Period Weeks    Status Revised      PT SHORT TERM GOAL #3   Title Pt will improve gait speed to at least 1.85 ft/sec in order to demo improved household mobility.    Baseline .90 ft/sec with RW; 1.73 ft/sec    Time 4    Period Weeks    Status Revised      PT SHORT TERM GOAL #4   Title Pt will ambulate at least >/= 600 ft with RW and supervision in order to demo improved household mobility.    Baseline 230 w/ RW and CGA/MinA    Time 4    Period Weeks    Status Revised      PT SHORT TERM GOAL #5    Title Pt will initiate Aquatic PT services and initiate Aquatic HEP    Baseline Referral for Aquatic placed    Time 4    Period Weeks    Status New               PT Long Term Goals - 04/14/21 1115       PT LONG TERM GOAL #1  Title Pt will be independent with land and aquatic HEP in with family supervision in order to build upon functional gains made in therapy. ALL LTGS DUE 06/10/21    Baseline independent with current HEP; will benefit from progressive HEP    Time 8    Period Weeks    Status Revised    Target Date 06/10/21      PT LONG TERM GOAL #2   Title Patient will improve Berg Balance to >/= 46/56 to demo reduced fall risk and imbalance    Baseline 41/56    Time 8    Period Weeks    Status Revised      PT LONG TERM GOAL #3   Title Pt will ambulate at least 500' outdoors with LRAD and supervision in order to demo improved community mobility.    Baseline 450' outdors with RW CGA/supervision    Time 8    Period Weeks    Status Revised      PT LONG TERM GOAL #4   Title Pt will go up and down 12 stairs with single handrail with supervision/min guard with step to pattern in order to safely go up/down the stairs in her house.    Baseline not yet assessed; 12 stairs with CGA/supervision bil rails    Time 8    Period Weeks    Status On-going      PT LONG TERM GOAL #5   Title Pt will perform 5x sit <> stand with UE support in 12 seconds or less with supervision in order to demo decr fall risk.    Baseline 23.69 seconds with min guard; 14.44 secs with UE support    Time 8    Period Weeks    Status Revised      PT LONG TERM GOAL #6   Title Pt will improve gait speed to at least 2.0 ft/sec in order to demo improved household mobility and decr fall risk.    Baseline .90 ft/sec with RW; 1.73 ft/sec    Time 8    Period Weeks    Status Revised                   Plan - 04/18/21 1025     Clinical Impression Statement Session limited today due to patient  arriving late, and having to educate on participation/what to expect for Aquatic therapy. Pt to begin aquatic therapy next week. Reviewed and progressed HEP to include theraband for increased strength and continued PNF activation/strengthening to continue to progress RLE strength. Will continue to progress toward all LTGs.    Personal Factors and Comorbidities Comorbidity 3+;Past/Current Experience;Time since onset of injury/illness/exacerbation    Comorbidities anxiety, Gilbert's syndrome, obesity, HLD    Examination-Activity Limitations Bathing;Bend;Caring for Others;Toileting;Stand;Stairs;Squat;Reach Overhead;Locomotion Level;Transfers    Examination-Participation Restrictions Cleaning;Community Activity;Driving;Medication Management    Stability/Clinical Decision Making Evolving/Moderate complexity    Rehab Potential Good    PT Frequency 2x / week    PT Duration 8 weeks    PT Treatment/Interventions ADLs/Self Care Home Management;Electrical Stimulation;DME Instruction;Gait training;Stair training;Functional mobility training;Therapeutic activities;Aquatic Therapy;Therapeutic exercise;Balance training;Neuromuscular re-education;Manual techniques;Orthotic Fit/Training;Patient/family education;Passive range of motion;Energy conservation;Vestibular;Visual/perceptual remediation/compensation    PT Next Visit Plan tall kneeling activities, go down on side. RLE NMR and strengthening (esp R hip). sit <> stands/functional strength with incr RLE weight bearing. Gait training with RW - ott[place]ock PLS AFO (pt will be getting a custom AFO from [employer]) , Continue to practice stairs. SciFit    PT Home Exercise  Plan [id number]    Consulted and Agree with Plan of Care Patient;Family member/caregiver             Patient will benefit from skilled therapeutic intervention in order to improve the following deficits and impairments:  Abnormal gait, Decreased activity tolerance, Decreased balance, Decreased cognition,  Decreased endurance, Decreased coordination, Decreased range of motion, Decreased safety awareness, Decreased strength, Hypomobility, Increased edema, Impaired sensation, Postural dysfunction, Impaired flexibility, Impaired tone, Difficulty walking, Impaired UE functional use, Impaired vision/preception  Visit Diagnosis: Hemiplegia and hemiparesis following cerebral infarction affecting right dominant side (HCC)  Unsteadiness on feet  Muscle weakness (generalized)  Other abnormalities of gait and mobility     Problem List Patient Active Problem List   Diagnosis Date Noted   Nausea without vomiting    Spastic hemiparesis (HCC)    History of hypertension    Neurogenic orthostatic hypotension (HCC)    Transaminitis    Right hemiplegia ([place])    Hemorrhagic stroke (HCC)    Leucocytosis    Essential hypertension    Dyslipidemia    Right hemiparesis (HCC)    ICH (intracerebral hemorrhage) ([place]) 12/01/2020   HYPERLIPIDEMIA 07/06/2009   OBESITY 07/06/2009    Jones Bales, PT, DPT 04/18/2021, 10:28 AM  [facility] [address] [place], [place], [address] Phone: [phone]   Fax:  [phone]  Name: DIONDRA PINES MRN: [id number] Date of Birth: Jul 28, 1965

## 2021-04-18 NOTE — Patient Instructions (Addendum)
° °  Aquatic Therapy: What to Expect!  Where:  [facility] at [facility] [address] [place], [place] [address] [phone]           How to Prepare: Please make sure you drink 8 ounces of water about one hour prior to your pool session A caregiver must attend the entire session with the patient (unless your primary therapists feels this is not necessary). The caregiver will be responsible for assisting with dressing as well as any toileting needs.  Please arrive IN YOUR SUIT and a few minutes prior to your appointment - this helps to avoid delays in starting your session. Please make sure to attend to any toileting needs prior to entering the pool Once on the pool deck your therapist will ask you to sign the Patient  Consent and Assignment of Benefits form Your therapist may take your blood pressure prior to, during and after your session if indicated We usually try and create a home exercise program based on activities we do in the pool.  Please be thinking about who might be able to assist you in the pool should you want to participate in an aquatic home exercise program at the time of discharge.  Some patients do not want to or do not have the ability to participate in an aquatic home program - this is not a barrier in any way to you participating in aquatic therapy as part of your current therapy plan!    About the pool: Entering the pool Your therapist will assist you; there are multiple ways to enter including stairs with railings, a walk in ramp, a roll in chair and a mechanical lift. Your therapist will determine the most appropriate way for you. Water temperature is usually between 86-87 degrees There may be other swimmers in the pool at the same time     Contact Info:             Appointments: [facility]         All sessions are 45 minutes   [place] [address]            Please call the [facility]  if   [place], [place]  [phone]           you need to cancel or reschedule an appointment.  Ruby, PT    Forde Radon, OTR/L    09/10/19      Access Code: [email or web address] URL: [email or web address] Date: 04/18/2021 Prepared by: Baldomero Lamy  Exercises Supine Bridge with [employer] Between Knees - 2 x daily - 5 x weekly - 1-2 sets - 10 reps Seated Hip Adduction Isometrics with Ball - 2 x daily - 5 x weekly - 1-2 sets - 10 reps Bent Knee Fallouts (Mirrored) - 2 x daily - 5 x weekly - 1-2 sets - 10 reps Supine Hip Flexion (Mirrored) - 2 x daily - 5 x weekly - 1-2 sets - 10 reps Sit to Stand with Armchair - 2 x daily - 5 x weekly - 1 sets - 10 reps Mini Squat with Counter Support - 2 x daily - 5 x weekly - 1-2 sets - 10 reps

## 2021-04-20 ENCOUNTER — Ambulatory Visit: Payer: 59 | Admitting: Physical Therapy

## 2021-04-21 ENCOUNTER — Encounter: Payer: Self-pay | Admitting: Physical Therapy

## 2021-04-21 ENCOUNTER — Ambulatory Visit: Payer: 59 | Admitting: Occupational Therapy

## 2021-04-21 ENCOUNTER — Ambulatory Visit: Payer: 59 | Attending: Family Medicine | Admitting: Physical Therapy

## 2021-04-21 ENCOUNTER — Other Ambulatory Visit: Payer: Self-pay

## 2021-04-21 DIAGNOSIS — R41842 Visuospatial deficit: Secondary | ICD-10-CM | POA: Diagnosis present

## 2021-04-21 DIAGNOSIS — R2681 Unsteadiness on feet: Secondary | ICD-10-CM

## 2021-04-21 DIAGNOSIS — R278 Other lack of coordination: Secondary | ICD-10-CM | POA: Diagnosis present

## 2021-04-21 DIAGNOSIS — I69318 Other symptoms and signs involving cognitive functions following cerebral infarction: Secondary | ICD-10-CM | POA: Insufficient documentation

## 2021-04-21 DIAGNOSIS — R2689 Other abnormalities of gait and mobility: Secondary | ICD-10-CM | POA: Diagnosis present

## 2021-04-21 DIAGNOSIS — M6281 Muscle weakness (generalized): Secondary | ICD-10-CM

## 2021-04-21 DIAGNOSIS — I69351 Hemiplegia and hemiparesis following cerebral infarction affecting right dominant side: Secondary | ICD-10-CM

## 2021-04-21 DIAGNOSIS — R29818 Other symptoms and signs involving the nervous system: Secondary | ICD-10-CM | POA: Diagnosis present

## 2021-04-21 NOTE — Therapy (Signed)
[facility] [address] [place], [place], [address] Phone: [phone]   Fax:  [phone]  Occupational Therapy Treatment  Patient Details  Name: Felicia Chen MRN: [id number] Date of Birth: 12-01-1965 Referring Provider (OT): Dr. Courtney Heys   Encounter Date: 04/21/2021   OT End of Session - 04/21/21 0943     Visit Number 16    Number of Visits 25    Date for OT Re-Evaluation 05/17/21    Authorization Type UHC: VL 40 per discipline, no auth required    OT Start Time 0805    OT Stop Time 0845    OT Time Calculation (min) 40 min    Activity Tolerance Patient tolerated treatment well    Behavior During Therapy [facility] for tasks assessed/performed             Past Medical History:  Diagnosis Date   Anxiety    Gilbert's syndrome     Past Surgical History:  Procedure Laterality Date   ABDOMINAL HYSTERECTOMY N/A 06/11/2012   Procedure: HYSTERECTOMY ABDOMINAL;  Surgeon: Allena Katz, MD;  Location: [facility];  Service: Gynecology;  Laterality: N/A;   CESAREAN SECTION     x 4   CYSTOSCOPY N/A 06/11/2012   Procedure: CYSTOSCOPY;  Surgeon: Allena Katz, MD;  Location: [facility];  Service: Gynecology;  Laterality: N/A;   HERNIA REPAIR     LAPAROSCOPIC ASSISTED VAGINAL HYSTERECTOMY N/A 06/11/2012   Procedure: LAPAROSCOPIC ASSISTED VAGINAL HYSTERECTOMY atttempted;  Surgeon: Allena Katz, MD;  Location: [facility];  Service: Gynecology;  Laterality: N/A;  Attempted Laparoscopic assisted vaginal hysterectomy.   LAPAROSCOPIC LYSIS OF ADHESIONS N/A 06/11/2012   Procedure: LAPAROSCOPIC LYSIS OF ADHESIONS;  Surgeon: Allena Katz, MD;  Location: [facility];  Service: Gynecology;  Laterality: N/A;   SALPINGOOPHORECTOMY Bilateral 06/11/2012   Procedure: SALPINGO OOPHORECTOMY;  Surgeon: Allena Katz, MD;  Location: [facility];  Service: Gynecology;  Laterality: Bilateral;    There were no vitals filed for this  visit.   Subjective Assessment - 04/21/21 0808     Patient is accompanied by: Family member    Pertinent History ICH 12/01/20 Lt frontal/parietal lobes w/ SAH extending over bilat cerebral hemispheres and cerebellum, Lt temporal lobe ischemic CVA w/ residual Rt hemiplegia. PMH: HLD, anxiety, covid-19, Gilbert's syndrome    Limitations fall risk    Patient Stated Goals get back to normal    Currently in Pain? No/denies             Supine: reviewed BUE sh flexion w/ foam roll (palms facing) - pt required cues to come fully down and relax, and to prevent shoulder hiking and tensing neck.  Supine: RUE at 90* holding 1 lb dumbbell and performing small circumduction ex's w/ min assist for control and scapula stabilization  Prone: scapula retraction and depression w/ cues Rt side to prevent shoulder hiking and facilitate correct movement. Followed by prone wt bearing on elbows w/ trunk extension for scapula depression. Pt cued to breath (not hold breathe) and to not lift from neck but chest.  Pt also required extensive cueing to get in and out of positions safely and reviewed extensively with pt/spouse. Spouse present for all education and pt instructed not to perform ex's at home unless spouse is there to cue her.  (Pt to bring daughter next session to review w/ her as well)  OT Education - 04/21/21 [phone]     Education Details HEP for scapula strengthening (prone)    Person(s) Educated Patient;Spouse    Methods Explanation;Demonstration;Verbal cues;Handout    Comprehension Verbalized understanding;Returned demonstration;Verbal cues required;Need further instruction              OT Short Term Goals - 04/04/21 0907       OT SHORT TERM GOAL #1   Title Independent with HEP for RUE shoulder ROM, hand coordination and hand strength    Time 4    Period Weeks    Status Achieved    Target Date 03/16/21      OT SHORT TERM GOAL #2   Title Pt to  perform all BADLS with no more than min assist    Time 4    Period Weeks    Status Achieved      OT SHORT TERM GOAL #3   Title Pt to write name and address with 50% or greater legibility    Time 4    Period Weeks    Status Achieved   90% legibility w/ extra time     OT SHORT TERM GOAL #4   Title Pt to prepare simple snack, sandwich, and microwaveable items consistently using DME prn with distant supervision only    Time 4    Period Weeks    Status On-going   pt req'd close supervision d/t unsteadiness with standing at counter for reaching items and managing refrigerator     OT SHORT TERM GOAL #5   Title Pt to improve coordination as evidenced by performing 9 hole peg test Rt hand in under 1 min. and 15 sec    Baseline 1 min. 45 sec    Time 4    Period Weeks    Status Achieved   52.87 sec     OT SHORT TERM GOAL #6   Title Pt to grip strength Rt hand to 30 lbs or greater in prep for opening jars    Baseline 22 lbs (Lt = 65 lbs)    Time 4    Period Weeks    Status On-going   24.4 lbs              OT Long Term Goals - 04/11/21 0911       OT LONG TERM GOAL #1   Title Independent with strengthening HEP for Rt shoulder    Time 12    Period Weeks    Status On-going      OT LONG TERM GOAL #2   Title Pt to fully attend to Rt side and return to using Rt hand as dominant hand 90% of the time safely for ADLS w/ task modifications prn    Time 12    Period Weeks    Status On-going      OT LONG TERM GOAL #3   Title Independent with all BADLS    Time 12    Period Weeks    Status On-going   assist w/ shower transfer and hooking bra     OT LONG TERM GOAL #4   Title Pt to perform simple cooking tasks with supervision/min cueing only    Time 12    Period Weeks    Status New      OT LONG TERM GOAL #5   Title Improve coordination Rt hand as evidenced by performing 9 hole peg test in 45 sec or less    Baseline 1 min. 45 sec.  Time 12    Period Weeks    Status Achieved    36.56 sec     Long Term Additional Goals   Additional Long Term Goals Yes      OT LONG TERM GOAL #6   Title Pt to improve grip strength Rt hand to 40 lbs or greater to open jars/containers    Baseline 22 lbs (Lt = 65 lbs)    Time 12    Period Weeks    Status On-going   36 lbs     OT LONG TERM GOAL #7   Title Pt to write name at 90% legibility and 3 sentences at 75% or greater legibility    Time 12    Period Weeks    Status On-going   Pt writing 3 simple sentences in print at 90% legibility     OT LONG TERM GOAL #8   Title Pt to improve coordination Rt hand as evidenced by performing 9 hole peg test in 30 sec or less    Baseline eval =  1 min. 45 sec,  at reassessement = 36.56 sec    Period Weeks    Status New                   Plan - 04/21/21 1448     Clinical Impression Statement Pt with decreased scapula stability Rt side and difficulty w/ scapula retraction prone.    OT Occupational Profile and History Detailed Assessment- Review of Records and additional review of physical, cognitive, psychosocial history related to current functional performance    Occupational performance deficits (Please refer to evaluation for details): ADL's;IADL's;Work;Leisure    Body Structure / Function / Physical Skills ADL;Strength;Dexterity;Balance;Tone;Body mechanics;Edema;Proprioception;UE functional use;IADL;ROM;Endurance;Mobility;Coordination;Sensation;FMC;Decreased knowledge of use of DME    Cognitive Skills Attention;Memory;Perception    Rehab Potential Good    Clinical Decision Making Several treatment options, min-mod task modification necessary    Comorbidities Affecting Occupational Performance: May have comorbidities impacting occupational performance    Modification or Assistance to Complete Evaluation  No modification of tasks or assist necessary to complete eval    OT Frequency 2x / week    OT Duration 12 weeks   PLUS EVAL   OT Treatment/Interventions Self-care/ADL  training;DME and/or AE instruction;Moist Heat;Therapeutic activities;Compression bandaging;Splinting;Therapeutic exercise;Ultrasound;Cognitive remediation/compensation;Coping strategies training;Visual/perceptual remediation/compensation;Passive range of motion;Neuromuscular education;Electrical Stimulation;Cryotherapy;Manual Therapy;Patient/family education    Plan review prone HEP, make sandwich (if bread available)    Consulted and Agree with Plan of Care Patient;Family member/caregiver    Family Member Consulted husband and daughter             Patient will benefit from skilled therapeutic intervention in order to improve the following deficits and impairments:   Body Structure / Function / Physical Skills: ADL, Strength, Dexterity, Balance, Tone, Body mechanics, Edema, Proprioception, UE functional use, IADL, ROM, Endurance, Mobility, Coordination, Sensation, FMC, Decreased knowledge of use of DME Cognitive Skills: Attention, Memory, Perception     Visit Diagnosis: Hemiplegia and hemiparesis following cerebral infarction affecting right dominant side (HCC)  Muscle weakness (generalized)  Unsteadiness on feet    Problem List Patient Active Problem List   Diagnosis Date Noted   Nausea without vomiting    Spastic hemiparesis (HCC)    History of hypertension    Neurogenic orthostatic hypotension (HCC)    Transaminitis    Right hemiplegia (HCC)    Hemorrhagic stroke (HCC)    Leucocytosis    Essential hypertension    Dyslipidemia    Right hemiparesis (  [place])    [place] (intracerebral hemorrhage) ([place]) 12/01/2020   HYPERLIPIDEMIA 07/06/2009   OBESITY 07/06/2009    Carey Bullocks, OTR/L 04/21/2021, 9:45 AM  [facility] [address] [place], [place], [address] Phone: [phone]   Fax:  [phone]  Name: Felicia Chen MRN: [id number] Date of Birth: May 02, 1965

## 2021-04-21 NOTE — Patient Instructions (Signed)
Scapular Retraction (Prone)    Lie with arms at sides. Pinch shoulder blades together and down towards buttocks. Do NOT hike shoulders Repeat _10___ times per set. Do _2___ sessions per day.  On Elbows (Prone)    Rise up on elbows as high as possible, keeping hips on floor. Hold __5__ seconds. Do NOT hold breathe (count out loud), do NOT lift from neck, only chest. Keep weight even on both elbows. (When relaxing, do NOT go back down as far as picture. Thumbs up, palms facing each other. Push through elbows, not hands)  Repeat __5__ times per set. Do __2__ sessions per day.

## 2021-04-21 NOTE — Therapy (Signed)
[facility] [address] [place], [place], [address] Phone: [phone]   Fax:  [phone]  Physical Therapy Treatment  Patient Details  Name: Felicia Chen MRN: [id number] Date of Birth: 18-Mar-1966 Referring Provider (PT): Dr. Dagoberto Ligas   Encounter Date: 04/21/2021   PT End of Session - 04/21/21 0852     Visit Number 16    Number of Visits 30    Date for PT Re-Evaluation 06/10/21    Authorization Type UHC -40 visit limit per discipline?    PT Start Time [phone]    PT Stop Time 0930    PT Time Calculation (min) 40 min    Equipment Utilized During Treatment Gait belt    Activity Tolerance Patient tolerated treatment well    Behavior During Therapy WFL for tasks assessed/performed             Past Medical History:  Diagnosis Date   Anxiety    Gilbert's syndrome     Past Surgical History:  Procedure Laterality Date   ABDOMINAL HYSTERECTOMY N/A 06/11/2012   Procedure: HYSTERECTOMY ABDOMINAL;  Surgeon: Allena Katz, MD;  Location: [facility];  Service: Gynecology;  Laterality: N/A;   CESAREAN SECTION     x 4   CYSTOSCOPY N/A 06/11/2012   Procedure: CYSTOSCOPY;  Surgeon: Allena Katz, MD;  Location: [facility];  Service: Gynecology;  Laterality: N/A;   HERNIA REPAIR     LAPAROSCOPIC ASSISTED VAGINAL HYSTERECTOMY N/A 06/11/2012   Procedure: LAPAROSCOPIC ASSISTED VAGINAL HYSTERECTOMY atttempted;  Surgeon: Allena Katz, MD;  Location: [facility];  Service: Gynecology;  Laterality: N/A;  Attempted Laparoscopic assisted vaginal hysterectomy.   LAPAROSCOPIC LYSIS OF ADHESIONS N/A 06/11/2012   Procedure: LAPAROSCOPIC LYSIS OF ADHESIONS;  Surgeon: Allena Katz, MD;  Location: [facility];  Service: Gynecology;  Laterality: N/A;   SALPINGOOPHORECTOMY Bilateral 06/11/2012   Procedure: SALPINGO OOPHORECTOMY;  Surgeon: Allena Katz, MD;  Location: [facility];  Service: Gynecology;  Laterality: Bilateral;    There  were no vitals filed for this visit.   Subjective Assessment - 04/21/21 0853     Subjective No falls. Things are going well at home. Her AFO is coming next week.    Patient is accompained by: Family member   daughther [name], sister [name]   Pertinent History anxiety, Gilbert's syndrome, obesity, HLD.    Limitations Walking;House hold activities;Standing    How long can you walk comfortably? 10 minutes    Patient Stated Goals wants to get back to normal.    Currently in Pain? No/denies    Pain Onset More than a month ago                               [name] Adult PT Treatment/Exercise - 04/21/21 0929       Transfers   Transfers Sit to Stand;Stand to Sit    Sit to Stand 5: Supervision    Stand to Sit 5: Supervision    Comments x10 reps with RLE staggered posteriorly, performed without UE support      Ambulation/Gait   Ambulation/Gait Yes    Ambulation/Gait Assistance 5: Supervision    Ambulation/Gait Assistance Details Between activities. Pt to receive her AFO from [employer] next week.    Assistive device Rolling walker    Gait Pattern Step-through pattern;Decreased stance time - right;Decreased step length - left;Decreased hip/knee flexion - right;Decreased dorsiflexion -  right;Decreased weight shift to right;Poor foot clearance - right;Right foot flat;Wide base of support;Abducted- right    Ambulation Surface Level;Indoor      Self-Care   Self-Care Other Self-Care Comments    Other Self-Care Comments  Reviewed instructions for aquatic therapy for next week with pt with no questions. Discussed importance of getting there early due to it being a long walk back to the pool.                 Balance Exercises - 04/21/21 0929       Balance Exercises: Standing   SLS with Vectors Solid surface    SLS with Vectors Limitations With RLE as stance leg - x15 reps forward taps with LLE to colorful floor bubble; beginning with UE support and then none with min guard -  cues to shift weight first to RLE, x15 reps lateral toe taps with LLE for incr SLS on RLE, UE support > none, verbal/tactile cues to keep a soft knee through RLE.    Rockerboard Lateral;Anterior/posterior;Limitations;Intermittent UE support    Rockerboard Limitations In A/P direction: weight shifting x12 reps with UE support and holding in posterior position for a couple of sessions for incr calf stretch then performed x15 reps weight shifting with no UE support with tactile cues to stand tall through RLE, static balance x30 seconds and alternating lifting each arm up to shoulder height x4 reps. In M/L direction: x15 reps weight shifting with UE support>none. Holding board steady 2 x 30 seconds. x8 reps reaching across body with LUE to tap target on R side for incr weight shifting. Min guard as needed for balance.    Retro Gait 3 reps;Limitations    Retro Gait Limitations in // bars with UE support, cued for hip extensor activation. Pt able to activate hip extensors when initially taking a step back first with RLE. Otherwise when stepping LLE and trying to step back with RLE, pt with tendency to hip hike.                  PT Short Term Goals - 04/14/21 1111       PT SHORT TERM GOAL #1   Title Pt will be independent with progressive HEP in order to build upon functional gains made in therapy (ALL STGs Due: 05/12/21)    Baseline family not present today to review HEP, but pt reports that performing consistently at home, will benefit from family review in future session    Time 4    Period Weeks    Status New    Target Date 05/12/21      PT SHORT TERM GOAL #2   Title Patient will improve Berg Balance to >/= 43/56 to demo improved balance and reduced fall risk    Baseline 36/56 initial assessment; 41/56    Time 4    Period Weeks    Status Revised      PT SHORT TERM GOAL #3   Title Pt will improve gait speed to at least 1.85 ft/sec in order to demo improved household mobility.     Baseline .90 ft/sec with RW; 1.73 ft/sec    Time 4    Period Weeks    Status Revised      PT SHORT TERM GOAL #4   Title Pt will ambulate at least >/= 600 ft with RW and supervision in order to demo improved household mobility.    Baseline 230 w/ RW and CGA/MinA    Time 4  Period Weeks    Status Revised      PT SHORT TERM GOAL #5   Title Pt will initiate Aquatic PT services and initiate Aquatic HEP    Baseline Referral for Aquatic placed    Time 4    Period Weeks    Status New               PT Long Term Goals - 04/14/21 1115       PT LONG TERM GOAL #1   Title Pt will be independent with land and aquatic HEP in with family supervision in order to build upon functional gains made in therapy. ALL LTGS DUE 06/10/21    Baseline independent with current HEP; will benefit from progressive HEP    Time 8    Period Weeks    Status Revised    Target Date 06/10/21      PT LONG TERM GOAL #2   Title Patient will improve Berg Balance to >/= 46/56 to demo reduced fall risk and imbalance    Baseline 41/56    Time 8    Period Weeks    Status Revised      PT LONG TERM GOAL #3   Title Pt will ambulate at least 500' outdoors with LRAD and supervision in order to demo improved community mobility.    Baseline 450' outdors with RW CGA/supervision    Time 8    Period Weeks    Status Revised      PT LONG TERM GOAL #4   Title Pt will go up and down 12 stairs with single handrail with supervision/min guard with step to pattern in order to safely go up/down the stairs in her house.    Baseline not yet assessed; 12 stairs with CGA/supervision bil rails    Time 8    Period Weeks    Status On-going      PT LONG TERM GOAL #5   Title Pt will perform 5x sit <> stand with UE support in 12 seconds or less with supervision in order to demo decr fall risk.    Baseline 23.69 seconds with min guard; 14.44 secs with UE support    Time 8    Period Weeks    Status Revised      PT LONG TERM GOAL  #6   Title Pt will improve gait speed to at least 2.0 ft/sec in order to demo improved household mobility and decr fall risk.    Baseline .90 ft/sec with RW; 1.73 ft/sec    Time 8    Period Weeks    Status Revised                   Plan - 04/21/21 1507     Clinical Impression Statement Today's skilled session focused on NMR for balance strategies for incr weight shift to RLE and on compliant surfaces. Worked on the rockerboard today in the lateral and A/P directions, with pt initially needing UE support and progressing to none. With incr reps able to progress to no UE support with some RLE SLS tasks. Pt tolerated session well, will continue to progress towards LTGs.    Personal Factors and Comorbidities Comorbidity 3+;Past/Current Experience;Time since onset of injury/illness/exacerbation    Comorbidities anxiety, Gilbert's syndrome, obesity, HLD    Examination-Activity Limitations Bathing;Bend;Caring for Others;Toileting;Stand;Stairs;Squat;Reach Overhead;Locomotion Level;Transfers    Examination-Participation Restrictions Cleaning;Community Activity;Driving;Medication Management    Stability/Clinical Decision Making Evolving/Moderate complexity    Rehab Potential Good  PT Frequency 2x / week    PT Duration 8 weeks    PT Treatment/Interventions ADLs/Self Care Home Management;Electrical Stimulation;DME Instruction;Gait training;Stair training;Functional mobility training;Therapeutic activities;Aquatic Therapy;Therapeutic exercise;Balance training;Neuromuscular re-education;Manual techniques;Orthotic Fit/Training;Patient/family education;Passive range of motion;Energy conservation;Vestibular;Visual/perceptual remediation/compensation    PT Next Visit Plan tall kneeling activities, go down on side. RLE NMR and strengthening (esp R hip). sit <> stands/functional strength with incr RLE weight bearing. Gait training with RW - ottobock PLS AFO (pt will be getting a custom AFO from [employer]) ,  Continue to practice stairs. SciFit    PT Home Exercise Plan [id number]    Consulted and Agree with Plan of Care Patient;Family member/caregiver             Patient will benefit from skilled therapeutic intervention in order to improve the following deficits and impairments:  Abnormal gait, Decreased activity tolerance, Decreased balance, Decreased cognition, Decreased endurance, Decreased coordination, Decreased range of motion, Decreased safety awareness, Decreased strength, Hypomobility, Increased edema, Impaired sensation, Postural dysfunction, Impaired flexibility, Impaired tone, Difficulty walking, Impaired UE functional use, Impaired vision/preception  Visit Diagnosis: Hemiplegia and hemiparesis following cerebral infarction affecting right dominant side (HCC)  Unsteadiness on feet  Muscle weakness (generalized)  Other abnormalities of gait and mobility     Problem List Patient Active Problem List   Diagnosis Date Noted   Nausea without vomiting    Spastic hemiparesis (HCC)    History of hypertension    Neurogenic orthostatic hypotension (HCC)    Transaminitis    Right hemiplegia ([place])    Hemorrhagic stroke (HCC)    Leucocytosis    Essential hypertension    Dyslipidemia    Right hemiparesis (HCC)    ICH (intracerebral hemorrhage) ([place]) 12/01/2020   HYPERLIPIDEMIA 07/06/2009   OBESITY 07/06/2009    Arliss Journey, PT, DPT  04/21/2021, 3:10 PM  [facility] [address] [place], [place], [address] Phone: [phone]   Fax:  [phone]  Name: Felicia Chen MRN: [id number] Date of Birth: 09-15-1965

## 2021-04-25 ENCOUNTER — Other Ambulatory Visit: Payer: Self-pay

## 2021-04-25 ENCOUNTER — Encounter: Payer: Self-pay | Admitting: Physical Therapy

## 2021-04-25 ENCOUNTER — Ambulatory Visit: Payer: 59 | Admitting: Occupational Therapy

## 2021-04-25 ENCOUNTER — Ambulatory Visit: Payer: 59 | Admitting: Physical Therapy

## 2021-04-25 ENCOUNTER — Encounter: Payer: 59 | Admitting: Speech Pathology

## 2021-04-25 DIAGNOSIS — I69351 Hemiplegia and hemiparesis following cerebral infarction affecting right dominant side: Secondary | ICD-10-CM

## 2021-04-25 DIAGNOSIS — R2689 Other abnormalities of gait and mobility: Secondary | ICD-10-CM

## 2021-04-25 DIAGNOSIS — R2681 Unsteadiness on feet: Secondary | ICD-10-CM

## 2021-04-25 DIAGNOSIS — M6281 Muscle weakness (generalized): Secondary | ICD-10-CM

## 2021-04-25 NOTE — Therapy (Signed)
[facility] [address] [place], [place], [address] Phone: [phone]   Fax:  [phone]  Physical Therapy Treatment  Patient Details  Name: Felicia Chen MRN: [id number] Date of Birth: 08/22/1965 Referring Provider (PT): Dr. Dagoberto Ligas   Encounter Date: 04/25/2021   PT End of Session - 04/25/21 0850     Visit Number 17    Number of Visits 30    Date for PT Re-Evaluation 06/10/21    Authorization Type UHC -40 visit limit per discipline?    PT Start Time [phone]    PT Stop Time 0928    PT Time Calculation (min) 41 min    Equipment Utilized During Treatment Gait belt    Activity Tolerance Patient tolerated treatment well    Behavior During Therapy WFL for tasks assessed/performed             Past Medical History:  Diagnosis Date   Anxiety    Gilbert's syndrome     Past Surgical History:  Procedure Laterality Date   ABDOMINAL HYSTERECTOMY N/A 06/11/2012   Procedure: HYSTERECTOMY ABDOMINAL;  Surgeon: Allena Katz, MD;  Location: [facility];  Service: Gynecology;  Laterality: N/A;   CESAREAN SECTION     x 4   CYSTOSCOPY N/A 06/11/2012   Procedure: CYSTOSCOPY;  Surgeon: Allena Katz, MD;  Location: [facility];  Service: Gynecology;  Laterality: N/A;   HERNIA REPAIR     LAPAROSCOPIC ASSISTED VAGINAL HYSTERECTOMY N/A 06/11/2012   Procedure: LAPAROSCOPIC ASSISTED VAGINAL HYSTERECTOMY atttempted;  Surgeon: Allena Katz, MD;  Location: [facility];  Service: Gynecology;  Laterality: N/A;  Attempted Laparoscopic assisted vaginal hysterectomy.   LAPAROSCOPIC LYSIS OF ADHESIONS N/A 06/11/2012   Procedure: LAPAROSCOPIC LYSIS OF ADHESIONS;  Surgeon: Allena Katz, MD;  Location: [facility];  Service: Gynecology;  Laterality: N/A;   SALPINGOOPHORECTOMY Bilateral 06/11/2012   Procedure: SALPINGO OOPHORECTOMY;  Surgeon: Allena Katz, MD;  Location: [facility];  Service: Gynecology;  Laterality: Bilateral;    There  were no vitals filed for this visit.   Subjective Assessment - 04/25/21 0849     Subjective No falls. Feels like she is getting stronger.    Patient is accompained by: Family member   daughther [name], sister [name]   Pertinent History anxiety, Gilbert's syndrome, obesity, HLD.    Limitations Walking;House hold activities;Standing    How long can you walk comfortably? 10 minutes    Patient Stated Goals wants to get back to normal.    Currently in Pain? No/denies    Pain Onset More than a month ago                               [facility] Adult PT Treatment/Exercise - 04/25/21 0926       Transfers   Transfers Floor to Transfer    Sit to Stand 5: Supervision    Stand to Sit 5: Supervision    Floor to Transfer 4: Min assist    Floor to Transfer Details (indicate cue type and reason) Worked on getting up and down from the floor (used red mat). Demonstrated technique to pt first before getting down to floor and used landmarks/use of weaker and stronger leg instead of R/L as that seemed to work better for patient. Min guard when turning to face mat (also had chair there for pt to use for balance as needed), using BUE support  on mat table - cued to get into tall half kneel position first with LLE forwards (needed a little assist getting RLE a bit farther back before slowly lowering to the ground in tall kneel position). Worked in tall kneeling position before standing up. Reviewed technique to stand back up (from tall kneel > half kneel) and stepping LLE forwards (needing assist to get LLE forwards), pt with incr anxiety in this position after attemping x1 rep and reporting not being able to stand due to LLE feeling heavy. Had rest break leaning forwards onto mat table with cues for deep breathing and provided water break for pt. During rest break, cued to work on Teacher, adult education with pt imagining herself performing the movement of standing back up before attempting again. Had pt scoot  back first in tall kneel position to have more room to step leg forward. Attempted again with LLE fowards (and therapist providing assist to shift to R first before stepping), but still with incr difficulty trying to stand. Performed instead with RLE forwards with pt with incr ease of bringing leg forwards and needing min A to help stand and for balance initially in standing. Able to perform with first attempt. Worked on taking a couple of deep breaths in standing/getting balance before turning and sitting on mat table. Pt reporting feeling better with having RLE (weaker leg) forwards to stand.      Neuro Re-ed    Neuro Re-ed Details  In tall kneeling on floor with mat in front: mini squats x10 reps with UE support > none - tactile cues at R ASIS for incr activation when coming upright, mini squats with bias to R side needin UE support x10 reps, keeping R side stable and stepping LLE out and in for incr weight bearing/closed chain hip ABD strengthening, reaching across body with LUE to weight shift to R to grab cone and then hand it back to therapist x8 reps total (pt fatigued more with this exercise). At end of session, supine on mat table: x10 reps bridging with legs extended of red physioball, needing intermittent assist from therapist for ball to remain stable.                     PT Education - 04/25/21 0939     Education Details At beginning of session, Letta Moynahan (aquatic PT) went over information/directions to get to pool for pt's appointment on Monday.    Person(s) Educated Patient   pt's daughter   Methods Explanation    Comprehension Verbalized understanding              PT Short Term Goals - 04/14/21 1111       PT SHORT TERM GOAL #1   Title Pt will be independent with progressive HEP in order to build upon functional gains made in therapy (ALL STGs Due: 05/12/21)    Baseline family not present today to review HEP, but pt reports that performing consistently at home, will  benefit from family review in future session    Time 4    Period Weeks    Status New    Target Date 05/12/21      PT SHORT TERM GOAL #2   Title Patient will improve Berg Balance to >/= 43/56 to demo improved balance and reduced fall risk    Baseline 36/56 initial assessment; 41/56    Time 4    Period Weeks    Status Revised      PT SHORT TERM  GOAL #3   Title Pt will improve gait speed to at least 1.85 ft/sec in order to demo improved household mobility.    Baseline .90 ft/sec with RW; 1.73 ft/sec    Time 4    Period Weeks    Status Revised      PT SHORT TERM GOAL #4   Title Pt will ambulate at least >/= 600 ft with RW and supervision in order to demo improved household mobility.    Baseline 230 w/ RW and CGA/MinA    Time 4    Period Weeks    Status Revised      PT SHORT TERM GOAL #5   Title Pt will initiate Aquatic PT services and initiate Aquatic HEP    Baseline Referral for Aquatic placed    Time 4    Period Weeks    Status New               PT Long Term Goals - 04/14/21 1115       PT LONG TERM GOAL #1   Title Pt will be independent with land and aquatic HEP in with family supervision in order to build upon functional gains made in therapy. ALL LTGS DUE 06/10/21    Baseline independent with current HEP; will benefit from progressive HEP    Time 8    Period Weeks    Status Revised    Target Date 06/10/21      PT LONG TERM GOAL #2   Title Patient will improve Berg Balance to >/= 46/56 to demo reduced fall risk and imbalance    Baseline 41/56    Time 8    Period Weeks    Status Revised      PT LONG TERM GOAL #3   Title Pt will ambulate at least 500' outdoors with LRAD and supervision in order to demo improved community mobility.    Baseline 450' outdors with RW CGA/supervision    Time 8    Period Weeks    Status Revised      PT LONG TERM GOAL #4   Title Pt will go up and down 12 stairs with single handrail with supervision/min guard with step to  pattern in order to safely go up/down the stairs in her house.    Baseline not yet assessed; 12 stairs with CGA/supervision bil rails    Time 8    Period Weeks    Status On-going      PT LONG TERM GOAL #5   Title Pt will perform 5x sit <> stand with UE support in 12 seconds or less with supervision in order to demo decr fall risk.    Baseline 23.69 seconds with min guard; 14.44 secs with UE support    Time 8    Period Weeks    Status Revised      PT LONG TERM GOAL #6   Title Pt will improve gait speed to at least 2.0 ft/sec in order to demo improved household mobility and decr fall risk.    Baseline .90 ft/sec with RW; 1.73 ft/sec    Time 8    Period Weeks    Status Revised                   Plan - 04/25/21 0942     Clinical Impression Statement Worked on floor transfers today with pt with incr ease of lowering to the ground with LLE forwards to get into half kneel > tall kneel, needing  min A. Took multiple attempts to work on coming to standing from half kneel position after working on exercises in tall kneel. Initially tried with LLE forwards, but unsuccessful and pt needing a prolonged rest break due to anxiety. Worked on deep breathing throughout transfer and visual imagery of performing the task. Tried with RLE forwards (pt's weaker leg) in tall kneel position with this working better for pt and able to stand upon first attempt and needing min A for transfer and to initially get balance in standing. Pt proud of herself at end of session for being able to perform. Will continue to progress towards LTGs.    Personal Factors and Comorbidities Comorbidity 3+;Past/Current Experience;Time since onset of injury/illness/exacerbation    Comorbidities anxiety, Gilbert's syndrome, obesity, HLD    Examination-Activity Limitations Bathing;Bend;Caring for Others;Toileting;Stand;Stairs;Squat;Reach Overhead;Locomotion Level;Transfers    Examination-Participation Restrictions  Cleaning;Community Activity;Driving;Medication Management    Stability/Clinical Decision Making Evolving/Moderate complexity    Rehab Potential Good    PT Frequency 2x / week    PT Duration 8 weeks    PT Treatment/Interventions ADLs/Self Care Home Management;Electrical Stimulation;DME Instruction;Gait training;Stair training;Functional mobility training;Therapeutic activities;Aquatic Therapy;Therapeutic exercise;Balance training;Neuromuscular re-education;Manual techniques;Orthotic Fit/Training;Patient/family education;Passive range of motion;Energy conservation;Vestibular;Visual/perceptual remediation/compensation    PT Next Visit Plan work on floor transfers again (but just trying to get directly back up instead of performing exercises first). RLE NMR and strengthening (esp R hip). sit <> stands/functional strength with incr RLE weight bearing. Gait training with RW - ottobock PLS AFO (pt will be getting a custom AFO from [employer]) , SciFit, standing balance.    PT Home Exercise Plan [id number]    Consulted and Agree with Plan of Care Patient;Family member/caregiver             Patient will benefit from skilled therapeutic intervention in order to improve the following deficits and impairments:  Abnormal gait, Decreased activity tolerance, Decreased balance, Decreased cognition, Decreased endurance, Decreased coordination, Decreased range of motion, Decreased safety awareness, Decreased strength, Hypomobility, Increased edema, Impaired sensation, Postural dysfunction, Impaired flexibility, Impaired tone, Difficulty walking, Impaired UE functional use, Impaired vision/preception  Visit Diagnosis: Hemiplegia and hemiparesis following cerebral infarction affecting right dominant side (HCC)  Muscle weakness (generalized)  Other abnormalities of gait and mobility  Unsteadiness on feet     Problem List Patient Active Problem List   Diagnosis Date Noted   Nausea without vomiting    Spastic  hemiparesis (HCC)    History of hypertension    Neurogenic orthostatic hypotension (HCC)    Transaminitis    Right hemiplegia ([place])    Hemorrhagic stroke (HCC)    Leucocytosis    Essential hypertension    Dyslipidemia    Right hemiparesis (HCC)    ICH (intracerebral hemorrhage) ([place]) 12/01/2020   HYPERLIPIDEMIA 07/06/2009   OBESITY 07/06/2009    Arliss Journey, PT, DPT  04/25/2021, 9:46 AM  [facility] [address] [place], [place], [address] Phone: [phone]   Fax:  [phone]  Name: Felicia Chen MRN: [id number] Date of Birth: [birth year]

## 2021-04-25 NOTE — Therapy (Signed)
[facility] [address] [place], [place], [address] Phone: [phone]   Fax:  [phone]  Occupational Therapy Treatment  Patient Details  Name: Felicia Chen MRN: [id number] Date of Birth: Dec 03, 1965 Referring Provider (OT): Dr. Courtney Heys   Encounter Date: 04/25/2021   OT End of Session - 04/25/21 0849     Visit Number 17    Number of Visits 25    Date for OT Re-Evaluation 05/17/21    Authorization Type UHC: VL 40 per discipline, no auth required    OT Start Time 0802    OT Stop Time 0847    OT Time Calculation (min) 45 min    Activity Tolerance Patient tolerated treatment well    Behavior During Therapy [facility] for tasks assessed/performed             Past Medical History:  Diagnosis Date   Anxiety    Gilbert's syndrome     Past Surgical History:  Procedure Laterality Date   ABDOMINAL HYSTERECTOMY N/A 06/11/2012   Procedure: HYSTERECTOMY ABDOMINAL;  Surgeon: Allena Katz, MD;  Location: [facility];  Service: Gynecology;  Laterality: N/A;   CESAREAN SECTION     x 4   CYSTOSCOPY N/A 06/11/2012   Procedure: CYSTOSCOPY;  Surgeon: Allena Katz, MD;  Location: [facility];  Service: Gynecology;  Laterality: N/A;   HERNIA REPAIR     LAPAROSCOPIC ASSISTED VAGINAL HYSTERECTOMY N/A 06/11/2012   Procedure: LAPAROSCOPIC ASSISTED VAGINAL HYSTERECTOMY atttempted;  Surgeon: Allena Katz, MD;  Location: [facility];  Service: Gynecology;  Laterality: N/A;  Attempted Laparoscopic assisted vaginal hysterectomy.   LAPAROSCOPIC LYSIS OF ADHESIONS N/A 06/11/2012   Procedure: LAPAROSCOPIC LYSIS OF ADHESIONS;  Surgeon: Allena Katz, MD;  Location: [facility];  Service: Gynecology;  Laterality: N/A;   SALPINGOOPHORECTOMY Bilateral 06/11/2012   Procedure: SALPINGO OOPHORECTOMY;  Surgeon: Allena Katz, MD;  Location: [facility];  Service: Gynecology;  Laterality: Bilateral;    There were no vitals filed for this  visit.   Subjective Assessment - 04/25/21 0824     Subjective  No pain, just stiffness in my hand    Patient is accompanied by: Family member    Pertinent History ICH 12/01/20 Lt frontal/parietal lobes w/ SAH extending over bilat cerebral hemispheres and cerebellum, Lt temporal lobe ischemic CVA w/ residual Rt hemiplegia. PMH: HLD, anxiety, covid-19, Gilbert's syndrome    Limitations fall risk    Patient Stated Goals get back to normal    Currently in Pain? No/denies             Practiced making sandwich in kitchen today with mod to max cueing and close supervision required for safety, walker negotiation, and mobility to decrease fall risk. Pt required more cueing today than last time simulating task.   Reviewed ex's in prone and prone on elbows for NMR w/ max cueing to get in/out of positions safely. Pt has however improved w/ scapula retraction/depression w/ less v.c and tactile cues  Daughter present today for all education                       OT Short Term Goals - 04/04/21 0907       OT SHORT TERM GOAL #1   Title Independent with HEP for RUE shoulder ROM, hand coordination and hand strength    Time 4    Period Weeks    Status Achieved  Target Date 03/16/21      OT SHORT TERM GOAL #2   Title Pt to perform all BADLS with no more than min assist    Time 4    Period Weeks    Status Achieved      OT SHORT TERM GOAL #3   Title Pt to write name and address with 50% or greater legibility    Time 4    Period Weeks    Status Achieved   90% legibility w/ extra time     OT SHORT TERM GOAL #4   Title Pt to prepare simple snack, sandwich, and microwaveable items consistently using DME prn with distant supervision only    Time 4    Period Weeks    Status On-going   pt req'd close supervision d/t unsteadiness with standing at counter for reaching items and managing refrigerator     OT SHORT TERM GOAL #5   Title Pt to improve coordination as evidenced by  performing 9 hole peg test Rt hand in under 1 min. and 15 sec    Baseline 1 min. 45 sec    Time 4    Period Weeks    Status Achieved   52.87 sec     OT SHORT TERM GOAL #6   Title Pt to grip strength Rt hand to 30 lbs or greater in prep for opening jars    Baseline 22 lbs (Lt = 65 lbs)    Time 4    Period Weeks    Status On-going   24.4 lbs              OT Long Term Goals - 04/11/21 0911       OT LONG TERM GOAL #1   Title Independent with strengthening HEP for Rt shoulder    Time 12    Period Weeks    Status On-going      OT LONG TERM GOAL #2   Title Pt to fully attend to Rt side and return to using Rt hand as dominant hand 90% of the time safely for ADLS w/ task modifications prn    Time 12    Period Weeks    Status On-going      OT LONG TERM GOAL #3   Title Independent with all BADLS    Time 12    Period Weeks    Status On-going   assist w/ shower transfer and hooking bra     OT LONG TERM GOAL #4   Title Pt to perform simple cooking tasks with supervision/min cueing only    Time 12    Period Weeks    Status New      OT LONG TERM GOAL #5   Title Improve coordination Rt hand as evidenced by performing 9 hole peg test in 45 sec or less    Baseline 1 min. 45 sec.    Time 12    Period Weeks    Status Achieved   36.56 sec     Long Term Additional Goals   Additional Long Term Goals Yes      OT LONG TERM GOAL #6   Title Pt to improve grip strength Rt hand to 40 lbs or greater to open jars/containers    Baseline 22 lbs (Lt = 65 lbs)    Time 12    Period Weeks    Status On-going   36 lbs     OT LONG TERM GOAL #7   Title Pt to  write name at 90% legibility and 3 sentences at 75% or greater legibility    Time 12    Period Weeks    Status On-going   Pt writing 3 simple sentences in print at 90% legibility     OT LONG TERM GOAL #8   Title Pt to improve coordination Rt hand as evidenced by performing 9 hole peg test in 30 sec or less    Baseline eval =  1 min.  45 sec,  at reassessement = 36.56 sec    Period Weeks    Status New                   Plan - 04/25/21 0850     Clinical Impression Statement Pt continues to require direct sup and cues for safety w/ kitchen mobility and walker negotiation.    OT Occupational Profile and History Detailed Assessment- Review of Records and additional review of physical, cognitive, psychosocial history related to current functional performance    Occupational performance deficits (Please refer to evaluation for details): ADL's;IADL's;Work;Leisure    Body Structure / Function / Physical Skills ADL;Strength;Dexterity;Balance;Tone;Body mechanics;Edema;Proprioception;UE functional use;IADL;ROM;Endurance;Mobility;Coordination;Sensation;FMC;Decreased knowledge of use of DME    Cognitive Skills Attention;Memory;Perception    Rehab Potential Good    Clinical Decision Making Several treatment options, min-mod task modification necessary    Comorbidities Affecting Occupational Performance: May have comorbidities impacting occupational performance    Modification or Assistance to Complete Evaluation  No modification of tasks or assist necessary to complete eval    OT Frequency 2x / week    OT Duration 12 weeks   PLUS EVAL   OT Treatment/Interventions Self-care/ADL training;DME and/or AE instruction;Moist Heat;Therapeutic activities;Compression bandaging;Splinting;Therapeutic exercise;Ultrasound;Cognitive remediation/compensation;Coping strategies training;Visual/perceptual remediation/compensation;Passive range of motion;Neuromuscular education;Electrical Stimulation;Cryotherapy;Manual Therapy;Patient/family education    Plan RUE NMR and functional use    Consulted and Agree with Plan of Care Patient;Family member/caregiver    Family Member Consulted husband and daughter             Patient will benefit from skilled therapeutic intervention in order to improve the following deficits and impairments:   Body  Structure / Function / Physical Skills: ADL, Strength, Dexterity, Balance, Tone, Body mechanics, Edema, Proprioception, UE functional use, IADL, ROM, Endurance, Mobility, Coordination, Sensation, FMC, Decreased knowledge of use of DME Cognitive Skills: Attention, Memory, Perception     Visit Diagnosis: Hemiplegia and hemiparesis following cerebral infarction affecting right dominant side (HCC)  Unsteadiness on feet    Problem List Patient Active Problem List   Diagnosis Date Noted   Nausea without vomiting    Spastic hemiparesis (HCC)    History of hypertension    Neurogenic orthostatic hypotension (HCC)    Transaminitis    Right hemiplegia ([facility])    Hemorrhagic stroke ([place] [facility])    Leucocytosis    Essential hypertension    Dyslipidemia    Right hemiparesis ([facility])    ICH (intracerebral hemorrhage) ([facility]) 12/01/2020   HYPERLIPIDEMIA 07/06/2009   OBESITY 07/06/2009    Carey Bullocks, OTR/L 04/25/2021, 8:51 AM  [facility] [address] [place], [place], [address] Phone: [phone]   Fax:  [phone]  Name: Felicia Chen MRN: [id number] Date of Birth: 30-Sep-1965

## 2021-04-26 ENCOUNTER — Ambulatory Visit: Payer: 59 | Admitting: Physical Therapy

## 2021-04-26 ENCOUNTER — Telehealth: Payer: Self-pay | Admitting: *Deleted

## 2021-04-26 DIAGNOSIS — I69351 Hemiplegia and hemiparesis following cerebral infarction affecting right dominant side: Secondary | ICD-10-CM

## 2021-04-26 DIAGNOSIS — M6281 Muscle weakness (generalized): Secondary | ICD-10-CM

## 2021-04-26 DIAGNOSIS — R2689 Other abnormalities of gait and mobility: Secondary | ICD-10-CM

## 2021-04-26 DIAGNOSIS — R2681 Unsteadiness on feet: Secondary | ICD-10-CM

## 2021-04-26 NOTE — Therapy (Signed)
[facility] [address] [place], [place], [address] Phone: [phone]   Fax:  [phone]  Physical Therapy Treatment  Patient Details  Name: Felicia Chen MRN: [id number] Date of Birth: 07/31/1965 Referring Provider (PT): Dr. Dagoberto Ligas   Encounter Date: 04/26/2021   PT End of Session - 04/26/21 1323     Visit Number 18    Number of Visits 30    Date for PT Re-Evaluation 06/10/21    Authorization Type UHC -40 visit limit per discipline?    PT Start Time 0850    PT Stop Time 0933    PT Time Calculation (min) 43 min    Equipment Utilized During Treatment Other (comment)   aqua dumbbells   Activity Tolerance Patient tolerated treatment well    Behavior During Therapy WFL for tasks assessed/performed             Past Medical History:  Diagnosis Date   Anxiety    Gilbert's syndrome     Past Surgical History:  Procedure Laterality Date   ABDOMINAL HYSTERECTOMY N/A 06/11/2012   Procedure: HYSTERECTOMY ABDOMINAL;  Surgeon: Allena Katz, MD;  Location: [facility];  Service: Gynecology;  Laterality: N/A;   CESAREAN SECTION     x 4   CYSTOSCOPY N/A 06/11/2012   Procedure: CYSTOSCOPY;  Surgeon: Allena Katz, MD;  Location: [facility];  Service: Gynecology;  Laterality: N/A;   HERNIA REPAIR     LAPAROSCOPIC ASSISTED VAGINAL HYSTERECTOMY N/A 06/11/2012   Procedure: LAPAROSCOPIC ASSISTED VAGINAL HYSTERECTOMY atttempted;  Surgeon: Allena Katz, MD;  Location: [facility];  Service: Gynecology;  Laterality: N/A;  Attempted Laparoscopic assisted vaginal hysterectomy.   LAPAROSCOPIC LYSIS OF ADHESIONS N/A 06/11/2012   Procedure: LAPAROSCOPIC LYSIS OF ADHESIONS;  Surgeon: Allena Katz, MD;  Location: [facility];  Service: Gynecology;  Laterality: N/A;   SALPINGOOPHORECTOMY Bilateral 06/11/2012   Procedure: SALPINGO OOPHORECTOMY;  Surgeon: Allena Katz, MD;  Location: [facility];  Service: Gynecology;  Laterality:  Bilateral;    There were no vitals filed for this visit.   Subjective Assessment - 04/26/21 1320     Subjective No issues to report, excited to be in the pool today.  Wonders if she needs water shoes to stabilize ankle.    Patient is accompained by: Family member   daughther [name], sister [name]   Pertinent History anxiety, Gilbert's syndrome, obesity, HLD.    Limitations Walking;House hold activities;Standing    How long can you walk comfortably? 10 minutes    Patient Stated Goals wants to get back to normal.    Currently in Pain? No/denies    Pain Onset More than a month ago             Patient seen for aquatic therapy today.  Treatment took place in water 3-4 feet deep depending upon activity.  Pt entered the pool via stairs with bilat rails descending with step to sequence leading with RLE.  When exiting pool, also utilized stairs with bilat rails and step to sequence but leading and pushing up with RLE.  Standing on bottom step, performed R gastroc stretch by dropping heel off end of step x 3 reps x 20 second hold.    Performed gait training across width of pool in 3.68ft of water with pt holding aqua dumbbell in each UE and PT providing min A.  Performed forwards gait x 4 laps, side stepping to L and R x 3  laps, and retro stepping x 2 laps.  PT provided verbal cues for sequencing and cues for increased weight shift and stance time on RLE prior to advancing LLE forwards, back or laterally.    Provided pt with rest break seated on bench while PT performed manual stretch of R ankle into eversion and DF and extension to toes.   Performed sit > stand from bench, holding aqua dumbbells in front.  Cued pt to lean forwards by pushing dumbbells forwards to shift COG over BOS and then come to stand; focused on controlled lowering when transitioning back to sit; performed x 10 reps. PT assisted by keeping R foot positioned on floor.     Focused on RLE SLS when performing closed chain hip  rotation - performed body over leg movement - lifting LLE and rotating trunk and LLE to R (R closed chain IR) keeping L foot lifted x 10 > L foot taps across midline x 10 reps.  Combined rotational movement to perform full 180 deg pivots to R and then to L with PT providing step by step verbal cues and min A for balance during R stance phase.  Pt requires buoyancy of water for support for partial body weight support when performing gait, to reduce fall risk with gait training and balance exercises with minimal UE support; exercises able to be performed safely in water without the risk of fall compared to those same exercises performed on land;  viscosity of water needed for resistance for strengthening.  Current of water provides perturbations for challenging static & dynamic standing balance.    PT Short Term Goals - 04/14/21 1111       PT SHORT TERM GOAL #1   Title Pt will be independent with progressive HEP in order to build upon functional gains made in therapy (ALL STGs Due: 05/12/21)    Baseline family not present today to review HEP, but pt reports that performing consistently at home, will benefit from family review in future session    Time 4    Period Weeks    Status New    Target Date 05/12/21      PT SHORT TERM GOAL #2   Title Patient will improve Berg Balance to >/= 43/56 to demo improved balance and reduced fall risk    Baseline 36/56 initial assessment; 41/56    Time 4    Period Weeks    Status Revised      PT SHORT TERM GOAL #3   Title Pt will improve gait speed to at least 1.85 ft/sec in order to demo improved household mobility.    Baseline .90 ft/sec with RW; 1.73 ft/sec    Time 4    Period Weeks    Status Revised      PT SHORT TERM GOAL #4   Title Pt will ambulate at least >/= 600 ft with RW and supervision in order to demo improved household mobility.    Baseline 230 w/ RW and CGA/MinA    Time 4    Period Weeks    Status Revised      PT SHORT TERM GOAL #5    Title Pt will initiate Aquatic PT services and initiate Aquatic HEP    Baseline Referral for Aquatic placed    Time 4    Period Weeks    Status New               PT Long Term Goals - 04/14/21 1115  PT LONG TERM GOAL #1   Title Pt will be independent with land and aquatic HEP in with family supervision in order to build upon functional gains made in therapy. ALL LTGS DUE 06/10/21    Baseline independent with current HEP; will benefit from progressive HEP    Time 8    Period Weeks    Status Revised    Target Date 06/10/21      PT LONG TERM GOAL #2   Title Patient will improve Berg Balance to >/= 46/56 to demo reduced fall risk and imbalance    Baseline 41/56    Time 8    Period Weeks    Status Revised      PT LONG TERM GOAL #3   Title Pt will ambulate at least 500' outdoors with LRAD and supervision in order to demo improved community mobility.    Baseline 450' outdors with RW CGA/supervision    Time 8    Period Weeks    Status Revised      PT LONG TERM GOAL #4   Title Pt will go up and down 12 stairs with single handrail with supervision/min guard with step to pattern in order to safely go up/down the stairs in her house.    Baseline not yet assessed; 12 stairs with CGA/supervision bil rails    Time 8    Period Weeks    Status On-going      PT LONG TERM GOAL #5   Title Pt will perform 5x sit <> stand with UE support in 12 seconds or less with supervision in order to demo decr fall risk.    Baseline 23.69 seconds with min guard; 14.44 secs with UE support    Time 8    Period Weeks    Status Revised      PT LONG TERM GOAL #6   Title Pt will improve gait speed to at least 2.0 ft/sec in order to demo improved household mobility and decr fall risk.    Baseline .90 ft/sec with RW; 1.73 ft/sec    Time 8    Period Weeks    Status Revised                   Plan - 04/26/21 1325     Clinical Impression Statement Utilized aquatic environment to provide  safe, gravity minimized environment for spasticity management, RLE strengthening and ROM, weight shifting, gait training, transfer training and standing balance.  Pt only required use of aqua dumbbells for UE support and balance.  Required intermittent assistance from PT to position R foot/toes and cues for more active weight shift to RLE.  Will continue to address and progress towards LTG.    Personal Factors and Comorbidities Comorbidity 3+;Past/Current Experience;Time since onset of injury/illness/exacerbation    Comorbidities anxiety, Gilbert's syndrome, obesity, HLD    Examination-Activity Limitations Bathing;Bend;Caring for Others;Toileting;Stand;Stairs;Squat;Reach Overhead;Locomotion Level;Transfers    Examination-Participation Restrictions Cleaning;Community Activity;Driving;Medication Management    Stability/Clinical Decision Making Evolving/Moderate complexity    Rehab Potential Good    PT Frequency 2x / week    PT Duration 8 weeks    PT Treatment/Interventions ADLs/Self Care Home Management;Electrical Stimulation;DME Instruction;Gait training;Stair training;Functional mobility training;Therapeutic activities;Aquatic Therapy;Therapeutic exercise;Balance training;Neuromuscular re-education;Manual techniques;Orthotic Fit/Training;Patient/family education;Passive range of motion;Energy conservation;Vestibular;Visual/perceptual remediation/compensation    PT Next Visit Plan work on floor transfers again (but just trying to get directly back up instead of performing exercises first). RLE NMR and strengthening (esp R hip). sit <> stands/functional strength with incr RLE  weight bearing. Gait training with RW - ottobock PLS AFO (pt will be getting a custom AFO from [employer]) , [employer], standing balance.    PT Home Exercise Plan [id number]    Consulted and Agree with Plan of Care Patient;Family member/caregiver             Patient will benefit from skilled therapeutic intervention in order to improve  the following deficits and impairments:  Abnormal gait, Decreased activity tolerance, Decreased balance, Decreased cognition, Decreased endurance, Decreased coordination, Decreased range of motion, Decreased safety awareness, Decreased strength, Hypomobility, Increased edema, Impaired sensation, Postural dysfunction, Impaired flexibility, Impaired tone, Difficulty walking, Impaired UE functional use, Impaired vision/preception  Visit Diagnosis: Hemiplegia and hemiparesis following cerebral infarction affecting right dominant side (HCC)  Unsteadiness on feet  Muscle weakness (generalized)  Other abnormalities of gait and mobility     Problem List Patient Active Problem List   Diagnosis Date Noted   Nausea without vomiting    Spastic hemiparesis (HCC)    History of hypertension    Neurogenic orthostatic hypotension (HCC)    Transaminitis    Right hemiplegia ([facility])    Hemorrhagic stroke (HCC)    Leucocytosis    Essential hypertension    Dyslipidemia    Right hemiparesis (HCC)    ICH (intracerebral hemorrhage) ([place]) 12/01/2020   HYPERLIPIDEMIA 07/06/2009   OBESITY 07/06/2009    Rico Junker, PT, DPT 04/26/21    1:44 PM   [facility] [address] [place], [place], [address] Phone: [phone]   Fax:  [phone]  Name: Felicia Chen MRN: [id number] Date of Birth: Feb 08, 1966

## 2021-04-26 NOTE — Telephone Encounter (Signed)
Notified. 

## 2021-04-26 NOTE — Telephone Encounter (Signed)
Felicia Chen called and reports that her therapist says she may benefit from botox.  She is wondering if it is a possibility with her appt this Friday.

## 2021-04-27 ENCOUNTER — Ambulatory Visit: Payer: 59 | Admitting: Occupational Therapy

## 2021-04-27 ENCOUNTER — Ambulatory Visit: Payer: 59

## 2021-04-27 ENCOUNTER — Other Ambulatory Visit: Payer: Self-pay

## 2021-04-27 DIAGNOSIS — R278 Other lack of coordination: Secondary | ICD-10-CM

## 2021-04-27 DIAGNOSIS — I69351 Hemiplegia and hemiparesis following cerebral infarction affecting right dominant side: Secondary | ICD-10-CM | POA: Diagnosis not present

## 2021-04-27 NOTE — Therapy (Signed)
[facility] [address] [place], [place], [address] Phone: [phone]   Fax:  [phone]  Occupational Therapy Treatment  Patient Details  Name: Felicia Chen MRN: [id number] Date of Birth: Aug 04, 1965 Referring Provider (OT): Dr. Courtney Heys   Encounter Date: 04/27/2021   OT End of Session - 04/27/21 0812     Visit Number 18    Number of Visits 25    Date for OT Re-Evaluation 05/17/21    Authorization Type [employer]: VL 40 per discipline, no auth required    OT Start Time 0802    OT Stop Time 0845    OT Time Calculation (min) 43 min    Activity Tolerance Patient tolerated treatment well    Behavior During Therapy [facility] for tasks assessed/performed             Past Medical History:  Diagnosis Date   Anxiety    Gilbert's syndrome     Past Surgical History:  Procedure Laterality Date   ABDOMINAL HYSTERECTOMY N/A 06/11/2012   Procedure: HYSTERECTOMY ABDOMINAL;  Surgeon: Allena Katz, MD;  Location: [facility];  Service: Gynecology;  Laterality: N/A;   CESAREAN SECTION     x 4   CYSTOSCOPY N/A 06/11/2012   Procedure: CYSTOSCOPY;  Surgeon: Allena Katz, MD;  Location: [facility];  Service: Gynecology;  Laterality: N/A;   HERNIA REPAIR     LAPAROSCOPIC ASSISTED VAGINAL HYSTERECTOMY N/A 06/11/2012   Procedure: LAPAROSCOPIC ASSISTED VAGINAL HYSTERECTOMY atttempted;  Surgeon: Allena Katz, MD;  Location: [facility];  Service: Gynecology;  Laterality: N/A;  Attempted Laparoscopic assisted vaginal hysterectomy.   LAPAROSCOPIC LYSIS OF ADHESIONS N/A 06/11/2012   Procedure: LAPAROSCOPIC LYSIS OF ADHESIONS;  Surgeon: Allena Katz, MD;  Location: [facility];  Service: Gynecology;  Laterality: N/A;   SALPINGOOPHORECTOMY Bilateral 06/11/2012   Procedure: SALPINGO OOPHORECTOMY;  Surgeon: Allena Katz, MD;  Location: [facility];  Service: Gynecology;  Laterality: Bilateral;    There were no vitals filed for this  visit.   Subjective Assessment - 04/27/21 0807     Subjective  I did the pool yesterday. It was good but I'm sore    Patient is accompanied by: Family member    Pertinent History ICH 12/01/20 Lt frontal/parietal lobes w/ SAH extending over bilat cerebral hemispheres and cerebellum, Lt temporal lobe ischemic CVA w/ residual Rt hemiplegia. PMH: HLD, anxiety, covid-19, Gilbert's syndrome    Limitations fall risk    Patient Stated Goals get back to normal    Currently in Pain? No/denies             Pt performing mid level reaching RUE (approx 70-85* sh flex) to place medium sized pegs in pegboard on vertical surface, 5 rows with min to mod shoulder compensations at shoulder as she fatigues. Pt required 2 rest breaks.   Pt placing small pegs in pegboard on tabletop surface (copying design) Rt hand w/ min difficulty.                            OT Short Term Goals - 04/04/21 0907       OT SHORT TERM GOAL #1   Title Independent with HEP for RUE shoulder ROM, hand coordination and hand strength    Time 4    Period Weeks    Status Achieved    Target Date 03/16/21      OT SHORT TERM  GOAL #2   Title Pt to perform all BADLS with no more than min assist    Time 4    Period Weeks    Status Achieved      OT SHORT TERM GOAL #3   Title Pt to write name and address with 50% or greater legibility    Time 4    Period Weeks    Status Achieved   90% legibility w/ extra time     OT SHORT TERM GOAL #4   Title Pt to prepare simple snack, sandwich, and microwaveable items consistently using DME prn with distant supervision only    Time 4    Period Weeks    Status On-going   pt req'd close supervision d/t unsteadiness with standing at counter for reaching items and managing refrigerator     OT SHORT TERM GOAL #5   Title Pt to improve coordination as evidenced by performing 9 hole peg test Rt hand in under 1 min. and 15 sec    Baseline 1 min. 45 sec    Time 4    Period  Weeks    Status Achieved   52.87 sec     OT SHORT TERM GOAL #6   Title Pt to grip strength Rt hand to 30 lbs or greater in prep for opening jars    Baseline 22 lbs (Lt = 65 lbs)    Time 4    Period Weeks    Status On-going   24.4 lbs              OT Long Term Goals - 04/11/21 0911       OT LONG TERM GOAL #1   Title Independent with strengthening HEP for Rt shoulder    Time 12    Period Weeks    Status On-going      OT LONG TERM GOAL #2   Title Pt to fully attend to Rt side and return to using Rt hand as dominant hand 90% of the time safely for ADLS w/ task modifications prn    Time 12    Period Weeks    Status On-going      OT LONG TERM GOAL #3   Title Independent with all BADLS    Time 12    Period Weeks    Status On-going   assist w/ shower transfer and hooking bra     OT LONG TERM GOAL #4   Title Pt to perform simple cooking tasks with supervision/min cueing only    Time 12    Period Weeks    Status New      OT LONG TERM GOAL #5   Title Improve coordination Rt hand as evidenced by performing 9 hole peg test in 45 sec or less    Baseline 1 min. 45 sec.    Time 12    Period Weeks    Status Achieved   36.56 sec     Long Term Additional Goals   Additional Long Term Goals Yes      OT LONG TERM GOAL #6   Title Pt to improve grip strength Rt hand to 40 lbs or greater to open jars/containers    Baseline 22 lbs (Lt = 65 lbs)    Time 12    Period Weeks    Status On-going   36 lbs     OT LONG TERM GOAL #7   Title Pt to write name at 90% legibility and 3 sentences at 75% or  greater legibility    Time 12    Period Weeks    Status On-going   Pt writing 3 simple sentences in print at 90% legibility     OT LONG TERM GOAL #8   Title Pt to improve coordination Rt hand as evidenced by performing 9 hole peg test in 30 sec or less    Baseline eval =  1 min. 45 sec,  at reassessement = 36.56 sec    Period Weeks    Status New                   Plan -  04/27/21 4503     Clinical Impression Statement Pt progressing with RUE functional use and progressing steadily towards goals    OT Occupational Profile and History Detailed Assessment- Review of Records and additional review of physical, cognitive, psychosocial history related to current functional performance    Occupational performance deficits (Please refer to evaluation for details): ADL's;IADL's;Work;Leisure    Body Structure / Function / Physical Skills ADL;Strength;Dexterity;Balance;Tone;Body mechanics;Edema;Proprioception;UE functional use;IADL;ROM;Endurance;Mobility;Coordination;Sensation;FMC;Decreased knowledge of use of DME    Cognitive Skills Attention;Memory;Perception    Rehab Potential Good    Clinical Decision Making Several treatment options, min-mod task modification necessary    Comorbidities Affecting Occupational Performance: May have comorbidities impacting occupational performance    Modification or Assistance to Complete Evaluation  No modification of tasks or assist necessary to complete eval    OT Frequency 2x / week    OT Duration 12 weeks   PLUS EVAL   OT Treatment/Interventions Self-care/ADL training;DME and/or AE instruction;Moist Heat;Therapeutic activities;Compression bandaging;Splinting;Therapeutic exercise;Ultrasound;Cognitive remediation/compensation;Coping strategies training;Visual/perceptual remediation/compensation;Passive range of motion;Neuromuscular education;Electrical Stimulation;Cryotherapy;Manual Therapy;Patient/family education    Plan RUE NMR and functional use, try sh flexion supine w/ 2 lb weight, UBE   Consulted and Agree with Plan of Care Patient;Family member/caregiver    Family Member Consulted husband and daughter             Patient will benefit from skilled therapeutic intervention in order to improve the following deficits and impairments:   Body Structure / Function / Physical Skills: ADL, Strength, Dexterity, Balance, Tone, Body  mechanics, Edema, Proprioception, UE functional use, IADL, ROM, Endurance, Mobility, Coordination, Sensation, FMC, Decreased knowledge of use of DME Cognitive Skills: Attention, Memory, Perception     Visit Diagnosis: Hemiplegia and hemiparesis following cerebral infarction affecting right dominant side (HCC)  Other lack of coordination    Problem List Patient Active Problem List   Diagnosis Date Noted   Nausea without vomiting    Spastic hemiparesis (HCC)    History of hypertension    Neurogenic orthostatic hypotension (HCC)    Transaminitis    Right hemiplegia ([place])    Hemorrhagic stroke ([place])    Leucocytosis    Essential hypertension    Dyslipidemia    Right hemiparesis ([place])    ICH (intracerebral hemorrhage) ([place]) 12/01/2020   HYPERLIPIDEMIA 07/06/2009   OBESITY 07/06/2009    Felicia Chen 04/27/2021, 8:20 AM  [facility] [address] [place], [place], [address] Phone: [phone]   Fax:  [phone]  Name: Felicia Chen MRN: [id number] Date of Birth: [birth year]

## 2021-04-28 ENCOUNTER — Ambulatory Visit: Payer: 59 | Admitting: Physical Therapy

## 2021-04-29 ENCOUNTER — Encounter: Payer: Self-pay | Admitting: Physical Medicine and Rehabilitation

## 2021-04-29 ENCOUNTER — Other Ambulatory Visit: Payer: Self-pay

## 2021-04-29 ENCOUNTER — Encounter: Payer: 59 | Attending: Physical Medicine and Rehabilitation | Admitting: Physical Medicine and Rehabilitation

## 2021-04-29 VITALS — BP 129/88 | HR 102 | Ht 67.0 in | Wt 215.0 lb

## 2021-04-29 DIAGNOSIS — G8191 Hemiplegia, unspecified affecting right dominant side: Secondary | ICD-10-CM | POA: Insufficient documentation

## 2021-04-29 DIAGNOSIS — G8110 Spastic hemiplegia affecting unspecified side: Secondary | ICD-10-CM | POA: Insufficient documentation

## 2021-04-29 DIAGNOSIS — I619 Nontraumatic intracerebral hemorrhage, unspecified: Secondary | ICD-10-CM | POA: Insufficient documentation

## 2021-04-29 MED ORDER — BACLOFEN 10 MG PO TABS: 10.0000 mg | ORAL_TABLET | Freq: Every day | ORAL | 5 refills | Status: DC

## 2021-04-29 NOTE — Progress Notes (Signed)
Subjective:    Patient ID: Felicia Chen, female    DOB: 09/20/1965, 56 y.o.   MRN: [id number]  HPI  Pt is a 56 yr old female with hx of R hemiplegia due to stroke in 10/22- secondary to L frontal ICH and small SAH- with associated spasticity and post STROKE hypotension- was on Florinef and Midodrine in hospital. Off Florinef and prn midodrine.  Here for f/u on spasticity and stroke.   Anxious this AM- "always anxious".   Taken off Lexapro- put on Wellbutrin- and made her anxiety worse- (was taking Zoloft at the time of stroke- -per Neuro thinks stroke was due to COVID PLUS SSRI).   It's been awful since December- since Lexapro off  Now 3 days of [place]- should start working in 1 week-   Off Florinef and off Midodrine.   Couldn't tolerate increase dose of Amantadine- in hospital.  Did increase Amantadine at f/u-   Spasticity-  R hand mainly And RLE When stand up toes curl- when trying to balance,  gets tense.  Did pool therapy- did discuss Botox.    Pain Inventory Average Pain 1 Pain Right Now 0 My pain is aching  LOCATION OF PAIN  leg  BOWEL Number of stools per week: 6 Oral laxative use No  Type of laxative . Enema or suppository use No  History of colostomy No  Incontinent No   BLADDER Normal In and out cath, frequency . Able to self cath  na Bladder incontinence No  Frequent urination No  Leakage with coughing No  Difficulty starting stream No  Incomplete bladder emptying No    Mobility use a walker how many minutes can you walk? 20 ability to climb steps?  yes do you drive?  yes  Function not employed: date last employed . I need assistance with the following:  bathing, meal prep, household duties, and shopping  Neuro/Psych trouble walking depression anxiety  Prior Studies CT/MRI  Physicians involved in your care Primary care Dr. Jordan Hawks [place] Neurologist Frann Rider, NP   No family history on file. Social History    Socioeconomic History   Marital status: Married    Spouse name: Not on file   Number of children: Not on file   Years of education: Not on file   Highest education level: Not on file  Occupational History   Not on file  Tobacco Use   Smoking status: Never   Smokeless tobacco: Never  Vaping Use   Vaping Use: Never used  Substance and Sexual Activity   Alcohol use: Yes    Comment: rarely   Drug use: No   Sexual activity: Yes    Partners: Male  Other Topics Concern   Not on file  Social History Narrative   Not on file   Social Determinants of Health   Financial Resource Strain: Not on file  Food Insecurity: Not on file  Transportation Needs: Not on file  Physical Activity: Not on file  Stress: Not on file  Social Connections: Not on file   Past Surgical History:  Procedure Laterality Date   ABDOMINAL HYSTERECTOMY N/A 06/11/2012   Procedure: HYSTERECTOMY ABDOMINAL;  Surgeon: Allena Katz, MD;  Location: [facility];  Service: Gynecology;  Laterality: N/A;   CESAREAN SECTION     x 4   CYSTOSCOPY N/A 06/11/2012   Procedure: CYSTOSCOPY;  Surgeon: Allena Katz, MD;  Location: [facility];  Service: Gynecology;  Laterality: N/A;   HERNIA REPAIR  LAPAROSCOPIC ASSISTED VAGINAL HYSTERECTOMY N/A 06/11/2012   Procedure: LAPAROSCOPIC ASSISTED VAGINAL HYSTERECTOMY atttempted;  Surgeon: Allena Katz, MD;  Location: [facility];  Service: Gynecology;  Laterality: N/A;  Attempted Laparoscopic assisted vaginal hysterectomy.   LAPAROSCOPIC LYSIS OF ADHESIONS N/A 06/11/2012   Procedure: LAPAROSCOPIC LYSIS OF ADHESIONS;  Surgeon: Allena Katz, MD;  Location: [facility];  Service: Gynecology;  Laterality: N/A;   SALPINGOOPHORECTOMY Bilateral 06/11/2012   Procedure: SALPINGO OOPHORECTOMY;  Surgeon: Allena Katz, MD;  Location: [facility];  Service: Gynecology;  Laterality: Bilateral;   Past Medical History:  Diagnosis Date   Anxiety    Gilbert's syndrome    BP 129/88    Pulse  (!) 102    Ht 5\' 7"  (1.702 m)    Wt 215 lb (97.5 kg)    SpO2 96%    BMI 33.67 kg/m   Opioid Risk Score:   Fall Risk Score:  `1  Depression screen PHQ 2/9  Depression screen [facility] 2/9 04/29/2021 01/24/2021  Decreased Interest 3 0  Down, Depressed, Hopeless 3 2  PHQ - 2 Score 6 2  Altered sleeping - 0  Tired, decreased energy - 3  Change in appetite - 3  Feeling bad or failure about yourself  - 0  Trouble concentrating - 0  Moving slowly or fidgety/restless - 0  Suicidal thoughts - 0  PHQ-9 Score - 8     Review of Systems  Musculoskeletal:  Positive for gait problem.       Leg pain  Psychiatric/Behavioral:  Positive for dysphoric mood. The patient is nervous/anxious.   All other systems reviewed and are negative.     Objective:   Physical Exam  Awake, alert, using RW ot walk- accompanied by husband-  Just got AFO today- wearing carbon fiber AFO on RLE; NAD  Neuro: MAS of 1+ in R hand; otherwise R wrist is MAS of 1 and fingers 1+ RLE- MAS of 2-3 in R knee- lMAS of 1+ to 2 in R hip and R foot MAS of 2   MS: RUE- 5-/5 in grip and FA; otherwise 5/5 in biceps, triceps and WE RLE- HF 4+/5; KE/5-/5 and DF 3-/5 and PF 4-/5 Wearing R AFO. Carbon fiber-   Gait- circumduction and R toe drag slightly- on RLE- halting gait- but just got AFO today. Learning to walk with it.   Balance is slightly off- more lumbar wiggle with walking than expected.   Clonus at R wrist 2-3 beats and and clonus 4-5 beats on R ankle Lost some ROM of R ankle- is ~ 85 degrees DF- but has good ROM in PF.      Assessment & Plan:   Pt is a 56 yr old female with hx of R hemiplegia due to stroke in 10/22- secondary to L frontal ICH and small SAH- with associated spasticity and post STROKE hypotension- was on Florinef and Midodrine in hospital. Off Florinef and prn midodrine.  Here for f/u on spasticity and stroke.   Suggest asking Neurology if can never go back on SSRIs- because her Sx's are SO bad.   2.   Need to get anxiety figured out before we change Amantadine- because I don't know if side effects will get better or worse if stops amantadine. Con't amantadine  3. Just got AFO on RLE- carbon fiber- learning to walk with it.    4.  Needs to wear PRAFO on RLE- every night.  Losing ROM of dorsiflexion-  5. Let's try you wearing PRAFO- but will schedule for Botox with Dr Kirstiens/Dr Ranell Patrick- for Achilles tightness- but could cancel if gains enough ROM of R dorsiflexion - call me in 1 week and will schedule Botox if need be.   6. Can try Baclofen 10 mg nightly- to improve ROM of ankle- and see how it works.    7. F/U in 3 months, but call in 1 week to let me know how spasticity is/schedule Botox.   I spent a total of 32   minutes on total care today- >50% coordination of care- due to  education on spasticity- and amantadine.   8. Done with SLP and OT 1 days/week

## 2021-04-29 NOTE — Patient Instructions (Signed)
Pt is a 56 yr old female with hx of R hemiplegia due to stroke in 10/22- secondary to L frontal ICH and small SAH- with associated spasticity and post STROKE hypotension- was on Florinef and Midodrine in hospital. Off Florinef and prn midodrine.  Here for f/u on spasticity and stroke.   Suggest asking Neurology if can never go back on SSRIs- because her Sx's are SO bad.   2.  Need to get anxiety figured out before we change Amantadine- because I don't know if side effects will get better or worse if stops amantadine. Con't amantadine  3. Just got AFO on RLE- carbon fiber- learning to walk with it.    4.  Needs to wear PRAFO on RLE- every night.  Losing ROM of dorsiflexion-   5. Let's try you wearing PRAFO- but will schedule for Botox with Dr Kirstiens/Dr Ranell Patrick- for Achilles tightness- but could cancel if gains enough ROM of R dorsiflexion - call me in 1 week and will schedule Botox if need be.   6. Can try Baclofen 10 mg nightly- to improve ROM of ankle- and see how it works.    7. F/U in m3onths, but call in 1 week to let me know how spasticity is/schedule Botox.

## 2021-05-02 ENCOUNTER — Ambulatory Visit: Payer: 59 | Admitting: Physical Therapy

## 2021-05-02 ENCOUNTER — Other Ambulatory Visit: Payer: Self-pay

## 2021-05-02 ENCOUNTER — Encounter: Payer: Self-pay | Admitting: Physical Therapy

## 2021-05-02 ENCOUNTER — Ambulatory Visit: Payer: 59 | Admitting: Occupational Therapy

## 2021-05-02 DIAGNOSIS — R2689 Other abnormalities of gait and mobility: Secondary | ICD-10-CM

## 2021-05-02 DIAGNOSIS — R278 Other lack of coordination: Secondary | ICD-10-CM

## 2021-05-02 DIAGNOSIS — I69351 Hemiplegia and hemiparesis following cerebral infarction affecting right dominant side: Secondary | ICD-10-CM

## 2021-05-02 DIAGNOSIS — R2681 Unsteadiness on feet: Secondary | ICD-10-CM

## 2021-05-02 DIAGNOSIS — M6281 Muscle weakness (generalized): Secondary | ICD-10-CM

## 2021-05-02 NOTE — Therapy (Signed)
[facility] [address] [place], [place], [address] Phone: [phone]   Fax:  [phone]  Occupational Therapy Treatment  Patient Details  Name: Felicia Chen MRN: [id number] Date of Birth: 1965-06-19 Referring Provider (OT): Dr. Courtney Heys   Encounter Date: 05/02/2021   OT End of Session - 05/02/21 0959     Visit Number 19    Number of Visits 25    Date for OT Re-Evaluation 05/17/21    Authorization Type UHC: VL 40 per discipline, no auth required    OT Start Time 0930    OT Stop Time 1015    OT Time Calculation (min) 45 min    Activity Tolerance Patient tolerated treatment well    Behavior During Therapy [facility] for tasks assessed/performed             Past Medical History:  Diagnosis Date   Anxiety    Gilbert's syndrome     Past Surgical History:  Procedure Laterality Date   ABDOMINAL HYSTERECTOMY N/A 06/11/2012   Procedure: HYSTERECTOMY ABDOMINAL;  Surgeon: Allena Katz, MD;  Location: [facility];  Service: Gynecology;  Laterality: N/A;   CESAREAN SECTION     x 4   CYSTOSCOPY N/A 06/11/2012   Procedure: CYSTOSCOPY;  Surgeon: Allena Katz, MD;  Location: [facility];  Service: Gynecology;  Laterality: N/A;   HERNIA REPAIR     LAPAROSCOPIC ASSISTED VAGINAL HYSTERECTOMY N/A 06/11/2012   Procedure: LAPAROSCOPIC ASSISTED VAGINAL HYSTERECTOMY atttempted;  Surgeon: Allena Katz, MD;  Location: [facility];  Service: Gynecology;  Laterality: N/A;  Attempted Laparoscopic assisted vaginal hysterectomy.   LAPAROSCOPIC LYSIS OF ADHESIONS N/A 06/11/2012   Procedure: LAPAROSCOPIC LYSIS OF ADHESIONS;  Surgeon: Allena Katz, MD;  Location: [facility];  Service: Gynecology;  Laterality: N/A;   SALPINGOOPHORECTOMY Bilateral 06/11/2012   Procedure: SALPINGO OOPHORECTOMY;  Surgeon: Allena Katz, MD;  Location: [facility];  Service: Gynecology;  Laterality: Bilateral;    There were no vitals filed for this  visit.   Subjective Assessment - 05/02/21 0932     Subjective  I have the pool again tomorrow    Patient is accompanied by: Family member    Pertinent History ICH 12/01/20 Lt frontal/parietal lobes w/ SAH extending over bilat cerebral hemispheres and cerebellum, Lt temporal lobe ischemic CVA w/ residual Rt hemiplegia. PMH: HLD, anxiety, covid-19, Gilbert's syndrome    Limitations fall risk    Patient Stated Goals get back to normal    Currently in Pain? No/denies            Supine: BUE sh flexion with foam roll. Followed by BUE sh flex with 2 lb dumbbell. Continued to require cues and reinforcement of proper technique and to prevent compensations.  Discussed light wt bearing over UE's w/ forward leans while seated.   Discussed ways to incorporate light IADLS into functional tasks - pt asked if she could begin watering her plants. Therapist felt she was safe to do this w/ plants on tabletop surface but made recommendations for watering lower plants to prevent falls.   Seated: pt placing key shaped pegs in pegboard with min difficulty, then removing with tweezers w/ max difficulty (Rt hand)                         OT Short Term Goals - 05/02/21 1001       OT SHORT TERM GOAL #1  Title Independent with HEP for RUE shoulder ROM, hand coordination and hand strength    Time 4    Period Weeks    Status Achieved    Target Date 03/16/21      OT SHORT TERM GOAL #2   Title Pt to perform all BADLS with no more than min assist    Time 4    Period Weeks    Status Achieved      OT SHORT TERM GOAL #3   Title Pt to write name and address with 50% or greater legibility    Time 4    Period Weeks    Status Achieved   90% legibility w/ extra time     OT SHORT TERM GOAL #4   Title Pt to prepare simple snack, sandwich, and microwaveable items consistently using DME prn with distant supervision only    Time 4    Period Weeks    Status On-going   pt req'd close supervision  d/t unsteadiness with standing at counter for reaching items and managing refrigerator     OT SHORT TERM GOAL #5   Title Pt to improve coordination as evidenced by performing 9 hole peg test Rt hand in under 1 min. and 15 sec    Baseline 1 min. 45 sec    Time 4    Period Weeks    Status Achieved   52.87 sec     OT SHORT TERM GOAL #6   Title Pt to grip strength Rt hand to 30 lbs or greater in prep for opening jars    Baseline 22 lbs (Lt = 65 lbs)    Time 4    Period Weeks    Status Achieved   36 lbs              OT Long Term Goals - 04/11/21 0911       OT LONG TERM GOAL #1   Title Independent with strengthening HEP for Rt shoulder    Time 12    Period Weeks    Status On-going      OT LONG TERM GOAL #2   Title Pt to fully attend to Rt side and return to using Rt hand as dominant hand 90% of the time safely for ADLS w/ task modifications prn    Time 12    Period Weeks    Status On-going      OT LONG TERM GOAL #3   Title Independent with all BADLS    Time 12    Period Weeks    Status On-going   assist w/ shower transfer and hooking bra     OT LONG TERM GOAL #4   Title Pt to perform simple cooking tasks with supervision/min cueing only    Time 12    Period Weeks    Status New      OT LONG TERM GOAL #5   Title Improve coordination Rt hand as evidenced by performing 9 hole peg test in 45 sec or less    Baseline 1 min. 45 sec.    Time 12    Period Weeks    Status Achieved   36.56 sec     Long Term Additional Goals   Additional Long Term Goals Yes      OT LONG TERM GOAL #6   Title Pt to improve grip strength Rt hand to 40 lbs or greater to open jars/containers    Baseline 22 lbs (Lt = 65 lbs)  Time 12    Period Weeks    Status On-going   36 lbs     OT LONG TERM GOAL #7   Title Pt to write name at 90% legibility and 3 sentences at 75% or greater legibility    Time 12    Period Weeks    Status On-going   Pt writing 3 simple sentences in print at 90%  legibility     OT LONG TERM GOAL #8   Title Pt to improve coordination Rt hand as evidenced by performing 9 hole peg test in 30 sec or less    Baseline eval =  1 min. 45 sec,  at reassessement = 36.56 sec    Period Weeks    Status New                   Plan - 05/02/21 1000     Clinical Impression Statement Pt progressing with RUE functional use and progressing steadily towards goals    OT Occupational Profile and History Detailed Assessment- Review of Records and additional review of physical, cognitive, psychosocial history related to current functional performance    Occupational performance deficits (Please refer to evaluation for details): ADL's;IADL's;Work;Leisure    Body Structure / Function / Physical Skills ADL;Strength;Dexterity;Balance;Tone;Body mechanics;Edema;Proprioception;UE functional use;IADL;ROM;Endurance;Mobility;Coordination;Sensation;FMC;Decreased knowledge of use of DME    Cognitive Skills Attention;Memory;Perception    Rehab Potential Good    Clinical Decision Making Several treatment options, min-mod task modification necessary    Comorbidities Affecting Occupational Performance: May have comorbidities impacting occupational performance    Modification or Assistance to Complete Evaluation  No modification of tasks or assist necessary to complete eval    OT Frequency 2x / week    OT Duration 12 weeks   PLUS EVAL   OT Treatment/Interventions Self-care/ADL training;DME and/or AE instruction;Moist Heat;Therapeutic activities;Compression bandaging;Splinting;Therapeutic exercise;Ultrasound;Cognitive remediation/compensation;Coping strategies training;Visual/perceptual remediation/compensation;Passive range of motion;Neuromuscular education;Electrical Stimulation;Cryotherapy;Manual Therapy;Patient/family education    Plan review prone HEP per pt request and videotape, supine BUE sh flexion review w/ 2 lb dumbbell, reassess grip strength and 9 hole peg test, UBE     Consulted and Agree with Plan of Care Patient;Family member/caregiver    Family Member Consulted husband and daughter             Patient will benefit from skilled therapeutic intervention in order to improve the following deficits and impairments:   Body Structure / Function / Physical Skills: ADL, Strength, Dexterity, Balance, Tone, Body mechanics, Edema, Proprioception, UE functional use, IADL, ROM, Endurance, Mobility, Coordination, Sensation, FMC, Decreased knowledge of use of DME Cognitive Skills: Attention, Memory, Perception     Visit Diagnosis: Hemiplegia and hemiparesis following cerebral infarction affecting right dominant side (HCC)  Muscle weakness (generalized)  Other lack of coordination    Problem List Patient Active Problem List   Diagnosis Date Noted   Nausea without vomiting    Spastic hemiparesis (HCC)    History of hypertension    Neurogenic orthostatic hypotension (HCC)    Transaminitis    Right hemiplegia ([place])    Hemorrhagic stroke ([place])    Leucocytosis    Essential hypertension    Dyslipidemia    Right hemiparesis ([place])    ICH (intracerebral hemorrhage) ([place]) 12/01/2020   HYPERLIPIDEMIA 07/06/2009   OBESITY 07/06/2009    Carey Bullocks, OTR/L 05/02/2021, 12:13 PM  [facility] [address] [place], [place], [address] Phone: [phone]   Fax:  [phone]  Name: Felicia Chen MRN:  [id number] Date of Birth: 1965-08-24

## 2021-05-02 NOTE — Therapy (Addendum)
[facility] [address] [place], [place], [address] Phone: [phone]   Fax:  [phone]  Physical Therapy Treatment  Patient Details  Name: Felicia Chen MRN: [id number] Date of Birth: 1965/06/13 Referring Provider (PT): Dr. Dagoberto Ligas   Encounter Date: 05/02/2021   PT End of Session - 05/02/21 0854     Visit Number 19    Number of Visits 30    Date for PT Re-Evaluation 06/10/21    Authorization Type UHC -40 visit limit per discipline?    PT Start Time [phone]    PT Stop Time 0930    PT Time Calculation (min) 43 min    Equipment Utilized During Treatment Other (comment)   aqua dumbbells   Activity Tolerance Patient tolerated treatment well    Behavior During Therapy WFL for tasks assessed/performed             Past Medical History:  Diagnosis Date   Anxiety    Gilbert's syndrome     Past Surgical History:  Procedure Laterality Date   ABDOMINAL HYSTERECTOMY N/A 06/11/2012   Procedure: HYSTERECTOMY ABDOMINAL;  Surgeon: Allena Katz, MD;  Location: [facility];  Service: Gynecology;  Laterality: N/A;   CESAREAN SECTION     x 4   CYSTOSCOPY N/A 06/11/2012   Procedure: CYSTOSCOPY;  Surgeon: Allena Katz, MD;  Location: [facility];  Service: Gynecology;  Laterality: N/A;   HERNIA REPAIR     LAPAROSCOPIC ASSISTED VAGINAL HYSTERECTOMY N/A 06/11/2012   Procedure: LAPAROSCOPIC ASSISTED VAGINAL HYSTERECTOMY atttempted;  Surgeon: Allena Katz, MD;  Location: [facility];  Service: Gynecology;  Laterality: N/A;  Attempted Laparoscopic assisted vaginal hysterectomy.   LAPAROSCOPIC LYSIS OF ADHESIONS N/A 06/11/2012   Procedure: LAPAROSCOPIC LYSIS OF ADHESIONS;  Surgeon: Allena Katz, MD;  Location: [facility];  Service: Gynecology;  Laterality: N/A;   SALPINGOOPHORECTOMY Bilateral 06/11/2012   Procedure: SALPINGO OOPHORECTOMY;  Surgeon: Allena Katz, MD;  Location: [facility];  Service: Gynecology;  Laterality:  Bilateral;    There were no vitals filed for this visit.   Subjective Assessment - 05/02/21 0855     Subjective Got her new custom AFO. Reports it is chunkier than she thought and feels heavy. Reports that she is gradually wearing it. Saw Dr. Dagoberto Ligas last week and asked about Botox, pt got started on Baclofen and has been wearing her PRAFO every night. No falls.    Patient is accompained by: Family member   daughther [name], sister [name]   Pertinent History anxiety, Gilbert's syndrome, obesity, HLD.    Limitations Walking;House hold activities;Standing    How long can you walk comfortably? 10 minutes    Patient Stated Goals wants to get back to normal.    Currently in Pain? No/denies    Pain Onset More than a month ago                               [facility] Adult PT Treatment/Exercise - 05/02/21 1110       Ambulation/Gait   Ambulation/Gait Yes    Ambulation/Gait Assistance 5: Supervision    Ambulation/Gait Assistance Details Ambulated with pt's new R custom articulating AFO brace that she obtained from [facility] on Friday. Used blue shoe cover for simulated toe cap (has not yet gotten leather toe cap on shoe yet) to help with foot clearance. Due to weight of brace and pt with R  hip weakness,  pt demonstrating incr R hip external rotation and bumping into RW at times with pt with decr foot clearance and knee flexion during swing phase. Compared to R PLS Ottobock AFO (what was used previously in the clinic and recommended for patient), pt able to demostrate improved foot clearance and less external rotation during gait. Did need intermittent cues for neutral foot placement and incr stance time on RLE. Pt with improved gait mechanics as well as improved gait speed as well. Overall pt reports improved comfort with this PLS AFO brace compared to her new custom AFO.    Ambulation Distance (Feet) 115 Feet   x3   Assistive device Rolling walker    Gait Pattern Step-through  pattern;Decreased stance time - right;Decreased step length - left;Decreased hip/knee flexion - right;Decreased dorsiflexion - right;Decreased weight shift to right;Poor foot clearance - right;Right foot flat;Wide base of support;Abducted- right    Ambulation Surface Level;Indoor    Pre-Gait Activities Standing on RLE with UE support > none - stepping LLE forwards and back x12 reps      Therapeutic Activites    Therapeutic Activities Other Therapeutic Activities    Other Therapeutic Activities Reviewed wear time for AFO and monitoring red spot on RLE where brace could be rubbing and to make [facility] aware, PT to reach out to [facility] regarding pt's current custom AFO and the differences in gait mechanics between this one and R ottobock PLS AFO (which was originally recommended and used in clinic) to see if pt can get them switched.      Exercises   Exercises Other Exercises    Other Exercises  Standing calf stretch for ankle DF with RLE, verbal and manual cues to help get RLE into proper position - cued to get heel onto floor first and then Chen into LLE knee flexion, performed 2 x 30 seconds each side. Explained and demonstrated to pt's daughter to help get pt set up in proper positioning, with daughter verbalizing understanding. Pt reporting feeling good from the stretch                       PT Short Term Goals - 04/14/21 1111       PT SHORT TERM GOAL #1   Title Pt will be independent with progressive HEP in order to build upon functional gains made in therapy (ALL STGs Due: 05/12/21)    Baseline family not present today to review HEP, but pt reports that performing consistently at home, will benefit from family review in future session    Time 4    Period Weeks    Status New    Target Date 05/12/21      PT SHORT TERM GOAL #2   Title Patient will improve Berg Balance to >/= 43/56 to demo improved balance and reduced fall risk    Baseline 36/56 initial assessment; 41/56     Time 4    Period Weeks    Status Revised      PT SHORT TERM GOAL #3   Title Pt will improve gait speed to at least 1.85 ft/sec in order to demo improved household mobility.    Baseline .90 ft/sec with RW; 1.73 ft/sec    Time 4    Period Weeks    Status Revised      PT SHORT TERM GOAL #4   Title Pt will ambulate at least >/= 600 ft with RW and supervision in order to demo improved  household mobility.    Baseline 230 w/ RW and CGA/MinA    Time 4    Period Weeks    Status Revised      PT SHORT TERM GOAL #5   Title Pt will initiate Aquatic PT services and initiate Aquatic HEP    Baseline Referral for Aquatic placed    Time 4    Period Weeks    Status New               PT Long Term Goals - 04/14/21 1115       PT LONG TERM GOAL #1   Title Pt will be independent with land and aquatic HEP in with family supervision in order to build upon functional gains made in therapy. ALL LTGS DUE 06/10/21    Baseline independent with current HEP; will benefit from progressive HEP    Time 8    Period Weeks    Status Revised    Target Date 06/10/21      PT LONG TERM GOAL #2   Title Patient will improve Berg Balance to >/= 46/56 to demo reduced fall risk and imbalance    Baseline 41/56    Time 8    Period Weeks    Status Revised      PT LONG TERM GOAL #3   Title Pt will ambulate at least 500' outdoors with LRAD and supervision in order to demo improved community mobility.    Baseline 450' outdors with RW CGA/supervision    Time 8    Period Weeks    Status Revised      PT LONG TERM GOAL #4   Title Pt will go up and down 12 stairs with single handrail with supervision/min guard with step to pattern in order to safely go up/down the stairs in her house.    Baseline not yet assessed; 12 stairs with CGA/supervision bil rails    Time 8    Period Weeks    Status On-going      PT LONG TERM GOAL #5   Title Pt will perform 5x sit <> stand with UE support in 12 seconds or less with  supervision in order to demo decr fall risk.    Baseline 23.69 seconds with min guard; 14.44 secs with UE support    Time 8    Period Weeks    Status Revised      PT LONG TERM GOAL #6   Title Pt will improve gait speed to at least 2.0 ft/sec in order to demo improved household mobility and decr fall risk.    Baseline .90 ft/sec with RW; 1.73 ft/sec    Time 8    Period Weeks    Status Revised                   Plan - 05/02/21 1436     Clinical Impression Statement Pt came into session today with new R custom AFO that she received from [facility]. Pt reporting increased anxiety/depression regarding this brace as it was not what she thought she was receiving. Trialed this brace alongside R ottobock PLS walk on AFO (what was recommended and what was practiced in PT sessions prior). Pt reporting and demonstrating improved gait mechanics with this brace compared to the custom AFO (see gait section for more specific details). PT to reach out to [facility]. Also added standing calf/ankle DF stretch to pt's HEP (per pt and pt's daughter, did not need handout). Will continue to progress towards  LTGs.    Personal Factors and Comorbidities Comorbidity 3+;Past/Current Experience;Time since onset of injury/illness/exacerbation    Comorbidities anxiety, Gilbert's syndrome, obesity, HLD    Examination-Activity Limitations Bathing;Bend;Caring for Others;Toileting;Stand;Stairs;Squat;Reach Overhead;Locomotion Level;Transfers    Examination-Participation Restrictions Cleaning;Community Activity;Driving;Medication Management    Stability/Clinical Decision Making Evolving/Moderate complexity    Rehab Potential Good    PT Frequency 2x / week    PT Duration 8 weeks    PT Treatment/Interventions ADLs/Self Care Home Management;Electrical Stimulation;DME Instruction;Gait training;Stair training;Functional mobility training;Therapeutic activities;Aquatic Therapy;Therapeutic exercise;Balance training;Neuromuscular  re-education;Manual techniques;Orthotic Fit/Training;Patient/family education;Passive range of motion;Energy conservation;Vestibular;Visual/perceptual remediation/compensation    PT Next Visit Plan work on floor transfers again (but just trying to get directly back up instead of performing exercises first). RLE NMR and strengthening (esp R hip). sit <> stands/functional strength with incr RLE weight bearing. Gait training with RW and brace. SciFit, standing balance.    PT Home Exercise Plan [id number]    Consulted and Agree with Plan of Care Patient;Family member/caregiver             Patient will benefit from skilled therapeutic intervention in order to improve the following deficits and impairments:  Abnormal gait, Decreased activity tolerance, Decreased balance, Decreased cognition, Decreased endurance, Decreased coordination, Decreased range of motion, Decreased safety awareness, Decreased strength, Hypomobility, Increased edema, Impaired sensation, Postural dysfunction, Impaired flexibility, Impaired tone, Difficulty walking, Impaired UE functional use, Impaired vision/preception  Visit Diagnosis: Unsteadiness on feet  Muscle weakness (generalized)  Other abnormalities of gait and mobility     Problem List Patient Active Problem List   Diagnosis Date Noted   Nausea without vomiting    Spastic hemiparesis (HCC)    History of hypertension    Neurogenic orthostatic hypotension (HCC)    Transaminitis    Right hemiplegia ([place])    Hemorrhagic stroke (HCC)    Leucocytosis    Essential hypertension    Dyslipidemia    Right hemiparesis (HCC)    ICH (intracerebral hemorrhage) ([place]) 12/01/2020   HYPERLIPIDEMIA 07/06/2009   OBESITY 07/06/2009    Arliss Journey, PT, DPT  05/02/2021, 2:39 PM  [facility] [address] [place], [place], [address] Phone: [phone]   Fax:  [phone]  Name: Felicia Chen MRN:  [id number] Date of Birth: Nov 12, 1965

## 2021-05-03 ENCOUNTER — Ambulatory Visit: Payer: 59 | Admitting: Physical Therapy

## 2021-05-03 DIAGNOSIS — R278 Other lack of coordination: Secondary | ICD-10-CM

## 2021-05-03 DIAGNOSIS — I69351 Hemiplegia and hemiparesis following cerebral infarction affecting right dominant side: Secondary | ICD-10-CM | POA: Diagnosis not present

## 2021-05-03 DIAGNOSIS — M6281 Muscle weakness (generalized): Secondary | ICD-10-CM

## 2021-05-03 DIAGNOSIS — R2689 Other abnormalities of gait and mobility: Secondary | ICD-10-CM

## 2021-05-03 DIAGNOSIS — R2681 Unsteadiness on feet: Secondary | ICD-10-CM

## 2021-05-03 NOTE — Therapy (Signed)
[facility] [address] [place], [place], [address] Phone: [phone]   Fax:  [phone]  Physical Therapy Treatment  Patient Details  Name: Felicia Chen MRN: [id number] Date of Birth: April 11, 1965 Referring Provider (PT): Dr. Dagoberto Ligas   Encounter Date: 05/03/2021   PT End of Session - 05/03/21 1030     Visit Number 20    Number of Visits 30    Date for PT Re-Evaluation 06/10/21    Authorization Type [employer] -40 visit limit per discipline?    PT Start Time 0845    PT Stop Time 0930    PT Time Calculation (min) 45 min    Equipment Utilized During Treatment Other (comment)   aqua dumbbells   Activity Tolerance Patient tolerated treatment well    Behavior During Therapy WFL for tasks assessed/performed             Past Medical History:  Diagnosis Date   Anxiety    Gilbert's syndrome     Past Surgical History:  Procedure Laterality Date   ABDOMINAL HYSTERECTOMY N/A 06/11/2012   Procedure: HYSTERECTOMY ABDOMINAL;  Surgeon: Allena Katz, MD;  Location: [facility];  Service: Gynecology;  Laterality: N/A;   CESAREAN SECTION     x 4   CYSTOSCOPY N/A 06/11/2012   Procedure: CYSTOSCOPY;  Surgeon: Allena Katz, MD;  Location: [facility];  Service: Gynecology;  Laterality: N/A;   HERNIA REPAIR     LAPAROSCOPIC ASSISTED VAGINAL HYSTERECTOMY N/A 06/11/2012   Procedure: LAPAROSCOPIC ASSISTED VAGINAL HYSTERECTOMY atttempted;  Surgeon: Allena Katz, MD;  Location: [facility];  Service: Gynecology;  Laterality: N/A;  Attempted Laparoscopic assisted vaginal hysterectomy.   LAPAROSCOPIC LYSIS OF ADHESIONS N/A 06/11/2012   Procedure: LAPAROSCOPIC LYSIS OF ADHESIONS;  Surgeon: Allena Katz, MD;  Location: [facility];  Service: Gynecology;  Laterality: N/A;   SALPINGOOPHORECTOMY Bilateral 06/11/2012   Procedure: SALPINGO OOPHORECTOMY;  Surgeon: Allena Katz, MD;  Location: [facility];  Service: Gynecology;  Laterality:  Bilateral;    There were no vitals filed for this visit.   Subjective Assessment - 05/03/21 0936     Subjective A little anxious today but pool is her "happy" place.  Alerted pt that primary PT had reached out to [facility] about miscommunication with AFO and that [facility] would be ordering carbon fiber AFO for patient.  Not sure if they will take the custom AFO back, may be able to use in the pool.  Has been using anti-spasmodic medication and PRAFO, is seeing a big difference.  Is thinking about a membership here; wondering if she would be able to use the weight machines here as well.    Patient is accompained by: Family member    Currently in Pain? No/denies             Patient seen for aquatic therapy today.  Treatment took place in water 3-4 feet deep depending upon activity.  Pt entered the pool via stairs alternating sequence with UE support on rails.    Utilizing bottom step, facing forwards towards pool, performed LLE step downs keeping R foot planted on step to perform closed chain, functional gastroc/soleus ROM; performed x 10 reps with therapist maintaining foot position on step and hip rotation forwards.  Changed to facing step with R foot on bottom step: therapist assisted with maintaining placement of R foot on step while pt shifted hips backwards activating quad to bring knee into extension and leaning  forwards for hamstring stretch.  Performed x 5 reps on RLE.    Donned air cast with water shoes on RLE to provide increased support to ankle.  Holding aqua dumbbells in each UE for balance: continued gait training forwards across width of pool in 3.74ft of water beginning with UE out wide for balance x 2 laps progressing to holding aqua bells under water and finally progressing to reciprocal UE swing.  During gait training focused on increasing R stance and L step length; also focused on initiation of R swing with hip and knee flexion.  Required min-mod A for balance as support with UE  changed from on top of water to under water.  Transitioned to side stepping to L and R across pool x 2 laps with aqua bells floating on top of water with continued focus on lateral weight shifting and SLS.    Transitioned to standing with UE support on edge of pool and ankle weight placed on R ankle.  Performed 1 set x 12 reps LLE hamstring curls and hip ABD to focus on SLS on RLE; also performed 1 set x 12 reps HS curls and hip ABD with RLE for strengthening and motor planning.  Performed 1 set x 12 reps R single leg squat <> stand with UE support on side of pool with therapist providing cues for slow transition from squat > stand and tactile cues to prevent genu recurvatum on RLE.    Pt exited the pool with step to sequence, leading with RLE to ascend with therapist providing min A for weight shifting forwards during step up and assistance to clear R toes when lifting to next step.  Pt requires buoyancy of water for support for joint offloading and partial body weight support during gait training, to reduce fall risk with gait training and balance exercises with minimal UE support; exercises able to be performed safely in water without the risk of fall compared to those same exercises performed on land;  viscosity of water needed for resistance for strengthening.  Current of water provides perturbations for challenging static & dynamic standing balance.     PT Education - 05/03/21 [phone]     Education Details Reviewed plan for AFO; discussed [employer] and role of personal trainers to review machines.    Person(s) Educated Patient    Methods Explanation    Comprehension Verbalized understanding              PT Short Term Goals - 04/14/21 1111       PT SHORT TERM GOAL #1   Title Pt will be independent with progressive HEP in order to build upon functional gains made in therapy (ALL STGs Due: 05/12/21)    Baseline family not present today to review HEP, but pt reports that  performing consistently at home, will benefit from family review in future session    Time 4    Period Weeks    Status New    Target Date 05/12/21      PT SHORT TERM GOAL #2   Title Patient will improve Berg Balance to >/= 43/56 to demo improved balance and reduced fall risk    Baseline 36/56 initial assessment; 41/56    Time 4    Period Weeks    Status Revised      PT SHORT TERM GOAL #3   Title Pt will improve gait speed to at least 1.85 ft/sec in order to demo improved household mobility.    Baseline .  90 ft/sec with RW; 1.73 ft/sec    Time 4    Period Weeks    Status Revised      PT SHORT TERM GOAL #4   Title Pt will ambulate at least >/= 600 ft with RW and supervision in order to demo improved household mobility.    Baseline 230 w/ RW and CGA/MinA    Time 4    Period Weeks    Status Revised      PT SHORT TERM GOAL #5   Title Pt will initiate Aquatic PT services and initiate Aquatic HEP    Baseline Referral for Aquatic placed    Time 4    Period Weeks    Status New               PT Long Term Goals - 04/14/21 1115       PT LONG TERM GOAL #1   Title Pt will be independent with land and aquatic HEP in with family supervision in order to build upon functional gains made in therapy. ALL LTGS DUE 06/10/21    Baseline independent with current HEP; will benefit from progressive HEP    Time 8    Period Weeks    Status Revised    Target Date 06/10/21      PT LONG TERM GOAL #2   Title Patient will improve Berg Balance to >/= 46/56 to demo reduced fall risk and imbalance    Baseline 41/56    Time 8    Period Weeks    Status Revised      PT LONG TERM GOAL #3   Title Pt will ambulate at least 500' outdoors with LRAD and supervision in order to demo improved community mobility.    Baseline 450' outdors with RW CGA/supervision    Time 8    Period Weeks    Status Revised      PT LONG TERM GOAL #4   Title Pt will go up and down 12 stairs with single handrail with  supervision/min guard with step to pattern in order to safely go up/down the stairs in her house.    Baseline not yet assessed; 12 stairs with CGA/supervision bil rails    Time 8    Period Weeks    Status On-going      PT LONG TERM GOAL #5   Title Pt will perform 5x sit <> stand with UE support in 12 seconds or less with supervision in order to demo decr fall risk.    Baseline 23.69 seconds with min guard; 14.44 secs with UE support    Time 8    Period Weeks    Status Revised      PT LONG TERM GOAL #6   Title Pt will improve gait speed to at least 2.0 ft/sec in order to demo improved household mobility and decr fall risk.    Baseline .90 ft/sec with RW; 1.73 ft/sec    Time 8    Period Weeks    Status Revised                   Plan - 05/03/21 1325     Clinical Impression Statement Utilized aquatic environment to continue to facilitate increased WB and ROM through R LE and ankle.  Pt demonstrated increased closed chain ankle DF ROM and increased stance time on RLE during gait training today.  Pt did utilize air cast today with water shoes to provide protection to toes and prevent excessive  inversion/supination of R ankle.    Personal Factors and Comorbidities Comorbidity 3+;Past/Current Experience;Time since onset of injury/illness/exacerbation    Comorbidities anxiety, Gilbert's syndrome, obesity, HLD    Examination-Activity Limitations Bathing;Bend;Caring for Others;Toileting;Stand;Stairs;Squat;Reach Overhead;Locomotion Level;Transfers    Examination-Participation Restrictions Cleaning;Community Activity;Driving;Medication Management    Stability/Clinical Decision Making Evolving/Moderate complexity    Rehab Potential Good    PT Frequency 2x / week    PT Duration 8 weeks    PT Treatment/Interventions ADLs/Self Care Home Management;Electrical Stimulation;DME Instruction;Gait training;Stair training;Functional mobility training;Therapeutic activities;Aquatic  Therapy;Therapeutic exercise;Balance training;Neuromuscular re-education;Manual techniques;Orthotic Fit/Training;Patient/family education;Passive range of motion;Energy conservation;Vestibular;Visual/perceptual remediation/compensation    PT Next Visit Plan work on floor transfers again (but just trying to get directly back up instead of performing exercises first). RLE NMR and strengthening (esp R hip). sit <> stands/functional strength with incr RLE weight bearing. Gait training with RW and brace. SciFit, standing balance.  Aquatic: gastroc/HS stretches, R SLS, step ups, R hip strengthening, tall kneeling on first step, supine float <> standing    PT Home Exercise Plan [id number]    Consulted and Agree with Plan of Care Patient;Family member/caregiver             Patient will benefit from skilled therapeutic intervention in order to improve the following deficits and impairments:  Abnormal gait, Decreased activity tolerance, Decreased balance, Decreased cognition, Decreased endurance, Decreased coordination, Decreased range of motion, Decreased safety awareness, Decreased strength, Hypomobility, Increased edema, Impaired sensation, Postural dysfunction, Impaired flexibility, Impaired tone, Difficulty walking, Impaired UE functional use, Impaired vision/preception  Visit Diagnosis: Hemiplegia and hemiparesis following cerebral infarction affecting right dominant side (HCC)  Muscle weakness (generalized)  Other lack of coordination  Unsteadiness on feet  Other abnormalities of gait and mobility     Problem List Patient Active Problem List   Diagnosis Date Noted   Nausea without vomiting    Spastic hemiparesis (HCC)    History of hypertension    Neurogenic orthostatic hypotension (HCC)    Transaminitis    Right hemiplegia ([facility])    Hemorrhagic stroke (HCC)    Leucocytosis    Essential hypertension    Dyslipidemia    Right hemiparesis (HCC)    ICH (intracerebral hemorrhage) ([facility])  12/01/2020   HYPERLIPIDEMIA 07/06/2009   OBESITY 07/06/2009    Rico Junker, PT, DPT 05/03/21    1:43 PM    [facility] [address] [place], [place], [address] Phone: [phone]   Fax:  [phone]  Name: Felicia Chen MRN: [id number] Date of Birth: 1965-04-03

## 2021-05-04 ENCOUNTER — Telehealth: Payer: Self-pay | Admitting: Adult Health

## 2021-05-04 ENCOUNTER — Ambulatory Visit: Payer: 59

## 2021-05-04 ENCOUNTER — Ambulatory Visit: Payer: 59 | Admitting: Adult Health

## 2021-05-04 NOTE — Progress Notes (Unsigned)
[facility] [address] [place]. [place] [address] [phone]       HOSPITAL FOLLOW UP NOTE  Felicia Chen Date of Birth:  [birth year] Medical Record Number:  [id number]   Reason for Referral:  hospital stroke follow up    SUBJECTIVE:   CHIEF COMPLAINT:  No chief complaint on file.   HPI:   Update 05/04/2021 JM: Patient returns for 67-month stroke follow-up.  Previous concerns today is in regards to ongoing anxiety.  At prior visit, attempted use of bupropion but reports made anxiety worse.  On sertraline PTA but concern this could have possibly caused RCVS. Ordered repeat CTA head/neck but not yet completed.  Continues to work with PT/OT for residual right hemiparesis and gait impairment.  Completed SLP with excellent improvement of cognition and believes currently at baseline. Continues to follow with PMR for spasticity management.  Denies new stroke/TIA symptoms.  Compliant on atorvastatin without side effects.  Blood pressure today ***.         History provided for reference purposes only Initial visit 02/08/2021 JM: Patient being seen for initial hospital follow-up accompanied by her husband and sister.    She has been making gradual improvement since discharge home Reports residual right hemiparesis and gait impairment -recently completed 3 weeks of HH PT/OT/SLP and scheduled for eval at neuro rehab by PT/OT/SLP on 11/30.  She does ambulate short distance with RW with 1 person assist otherwise will use w/c for long distance -denies any recent falls Does experience stiffness greater in hand and ankle (especially upon awakening) and tremor in RUE and RLE Believes her mood has been stable but still can experience some depression and anxiety.  She was started on citalopram during CIR admission and as needed Xanax and BuSpar. - she questions ongoing use of citalopram as her prior SSRI d/c'd due to possible cause of possible RCVS - she questions  if there are any other options for treatment Lives with her husband who assists as needed - very supportive family Denies new stroke/TIA symptoms  Compliant on atorvastatin 80 mg daily -denies side effects Blood pressure today 129/78 - use of Florinef as needed but has not recently used Denies any smoking history, EtOH or drug use  Of note, sister mentions that she personally underwent embolization of cerebral aneurysm and father has hx of carotid stenosis requiring intervention.   Stroke admission 12/01/2020 Felicia Chen is a 56 y.o. female with history of significant for hyperlipidemia, obesity (BMI 39.5), and anxiety who presented on 12/01/2020 with confusion as well as headache in the setting of recent COVID infection.  Personally reviewed hospitalization pertinent progress notes, lab work and imaging.  Evaluated by Dr. 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  CTA head/neck no evidence of vascular lesion underlying the lobar hematoma although scattered segmental narrowing suspect RCVS or similar vasculopathy.  Questional possibility RCVS triggered by sertraline and COVID infection.  Recommended repeat CTA in 2 weeks to reevaluate for cerebral vasoconstriction versus atherosclerotic stenosis.  MRV and CTV no evidence of CVST.  Repeat CT head stable hematoma with mild SAH.  EF 60 to 65%.  TCD no evidence of vasospasm.  LDL 212.  A1c 5.4. UDS negative.  Cardiolipin antibodies negative.  No prior history of HTN, BP treated with Cleviprex and initiated losartan.  Initiate atorvastatin 80 mg daily.  No prior stroke history.  Hospital course  complicated by a fall on 9/18 with right facial and eyelid bruising and edema - CT head with no change.  Sertraline discontinued due to suspicion of RCVS and ultimately placed on BuSpar and as needed Atarax and Xanax (until BuSpar kicks in).  Residual deficits of  right hemiparesis and gait impairment.  Therapy eval's recommended CIR for functional decline. During CIR admission, bouts of orthostasis with improvement after Florinef and ProAmatine and neurogenic bladder and Urecholine initiated.      PERTINENT IMAGING  MR brain w wo contrast 03/11/2021 IMPRESSION: This MRI of the brain with and without contrast shows the following: 1.   Resolving left parietal intracranial hemorrhage showing expected evolutionary changes..  Since the previous MRI from 12/01/2020, the small subarachnoid hemorrhage has resolved.  Mass-effect has resolved. 2.   Few scattered T2/FLAIR hyperintense foci consistent with minimal chronic microvascular ischemic change, typical for age. 3.   Normal enhancement pattern.  No acute findings.     Per recent hospitalization CT head 1. 60 cc left frontal parietal lobar hematoma with regiona subarachnoid extension. CTA head & neck  . No spot sign or discrete vascular lesion underlying the lobar hematoma. 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 CTV negative for CVST MRI  brain : no evidence temporal lobe ischemia as initially deszcribed on head CT. Continues to show stable size of left parietal IPH with subarachnoid extension overlying bilateral cerebral hemispheres are well as cerebellar regions. Mild edema, no midline shift.  MRV: no evidence CVST  CT head 9/18 -stable hematoma and mild SAH 2D Echo LVEF 60-65%, no regional wall abnormalities, LVH which is mild, grade I diastolic dysfunction TCDs no evidence of vasospasm  LDL 212 HgbA1c 5.4    ROS:   14 system review of systems performed and negative with exception of those listed in HPI  PMH:  Past Medical History:  Diagnosis Date   Anxiety    Gilbert's syndrome     PSH:  Past Surgical History:  Procedure Laterality Date   ABDOMINAL HYSTERECTOMY N/A 06/11/2012   Procedure: HYSTERECTOMY ABDOMINAL;  Surgeon:  Allena Katz, MD;  Location: [facility];  Service: Gynecology;  Laterality: N/A;   CESAREAN SECTION     x 4   CYSTOSCOPY N/A 06/11/2012   Procedure: CYSTOSCOPY;  Surgeon: Allena Katz, MD;  Location: [facility];  Service: Gynecology;  Laterality: N/A;   HERNIA REPAIR     LAPAROSCOPIC ASSISTED VAGINAL HYSTERECTOMY N/A 06/11/2012   Procedure: LAPAROSCOPIC ASSISTED VAGINAL HYSTERECTOMY atttempted;  Surgeon: Allena Katz, MD;  Location: [facility];  Service: Gynecology;  Laterality: N/A;  Attempted Laparoscopic assisted vaginal hysterectomy.   LAPAROSCOPIC LYSIS OF ADHESIONS N/A 06/11/2012   Procedure: LAPAROSCOPIC LYSIS OF ADHESIONS;  Surgeon: Allena Katz, MD;  Location: [facility];  Service: Gynecology;  Laterality: N/A;   SALPINGOOPHORECTOMY Bilateral 06/11/2012   Procedure: SALPINGO OOPHORECTOMY;  Surgeon: Allena Katz, MD;  Location: [facility];  Service: Gynecology;  Laterality: Bilateral;    Social History:  Social History   Socioeconomic History   Marital status: Married    Spouse name: Not on file   Number of children: Not on file   Years of education: Not on file   Highest education level: Not on file  Occupational History   Not on file  Tobacco Use   Smoking status: Never   Smokeless tobacco: Never  Vaping Use   Vaping Use: Never used  Substance  and Sexual Activity   Alcohol use: Yes    Comment: rarely   Drug use: No   Sexual activity: Yes    Partners: Male  Other Topics Concern   Not on file  Social History Narrative   Not on file   Social Determinants of Health   Financial Resource Strain: Not on file  Food Insecurity: Not on file  Transportation Needs: Not on file  Physical Activity: Not on file  Stress: Not on file  Social Connections: Not on file  Intimate Partner Violence: Not on file    Family History: No family history on file.  Medications:   Current Outpatient Medications on File Prior to Visit  Medication Sig Dispense Refill    acetaminophen (TYLENOL) 325 MG tablet Take 2 tablets (650 mg total) by mouth every 4 (four) hours as needed for mild pain (or temp > 37.5 C (99.5 F)).     ALPRAZolam (XANAX) 0.5 MG tablet Take 1 tablet (0.5 mg total) by mouth 3 (three) times daily. (Patient not taking: Reported on 04/29/2021) 90 tablet 0   amantadine (SYMMETREL) 100 MG capsule Take 2 capsules (200 mg total) by mouth daily. 60 capsule 3   atorvastatin (LIPITOR) 80 MG tablet Take 1 tablet (80 mg total) by mouth daily. 30 tablet 0   baclofen (LIORESAL) 10 MG tablet Take 1 tablet (10 mg total) by mouth at bedtime. 30 each 5   busPIRone (BUSPAR) 5 MG tablet Take 1 tablet (5 mg total) by mouth 3 (three) times daily as needed (anxiety- take before xanax). (Patient not taking: Reported on 04/29/2021) 90 tablet 5   citalopram (CELEXA) 20 MG tablet Take 1 tablet (20 mg total) by mouth daily. (Patient not taking: Reported on 02/16/2021) 30 tablet 5   COVID-19 mRNA bivalent vaccine, Moderna, (MODERNA COVID-19 BIVAL BOOSTER) 50 MCG/0.5ML injection Inject into the muscle. 0.5 mL 0   Dextromethorphan-buPROPion ER (AUVELITY) 45-105 MG TBCR Take 45-105 mg by mouth 2 (two) times daily.     fludrocortisone (FLORINEF) 0.1 MG tablet Take 3 tablets (0.3 mg total) by mouth daily. (Patient not taking: Reported on 04/29/2021) 90 tablet 1   ondansetron (ZOFRAN) 4 MG tablet Take 1 tablet (4 mg total) by mouth every 8 (eight) hours as needed for nausea or vomiting. (Patient not taking: Reported on 04/29/2021) 30 tablet 5   zolpidem (AMBIEN) 10 MG tablet Take 10 mg by mouth at bedtime as needed for sleep.     No current facility-administered medications on file prior to visit.    Allergies:  No Known Allergies    OBJECTIVE:  Physical Exam  There were no vitals filed for this visit.  There is no height or weight on file to calculate BMI. No results found.  Post stroke PHQ 2/9 Depression screen PHQ 2/9 04/29/2021  Decreased Interest 3  Down, Depressed,  Hopeless 3  PHQ - 2 Score 6  Altered sleeping -  Tired, decreased energy -  Change in appetite -  Feeling bad or failure about yourself  -  Trouble concentrating -  Moving slowly or fidgety/restless -  Suicidal thoughts -  PHQ-9 Score -     General: well developed, well nourished, very pleasant middle-age Caucasian female, seated, in no evident distress Head: head normocephalic and atraumatic.   Neck: supple with no carotid or supraclavicular bruits Cardiovascular: regular rate and rhythm, no murmurs Musculoskeletal: no deformity Skin:  no rash/petichiae Vascular:  Normal pulses all extremities   Neurologic Exam Mental Status: Awake and  fully alert.  Fluent speech and language.  Oriented to place and time. Recent and remote memory intact. Attention span, concentration and fund of knowledge appropriate during visit. Mood and affect appropriate.  Cranial Nerves: Pupils equal, briskly reactive to light. Extraocular movements full without nystagmus. Visual fields full to confrontation. Hearing intact. Facial sensation intact.  Mild right lower facial weakness.  Tongue, and palate moves normally and symmetrically.  Motor: Normal strength, bulk and tone on left side RUE: 3/5 deltoid, 3+/5 elbow extension and flexion, 2/5 grip (although limited assessment due to pain) -increased tone throughout and mild tremor with activation RLE: 4/5 HF, KE and KF, 1/5 ADF and APF- mild tremor with activation Sensory.: intact to touch , pinprick , position and vibratory sensation.  Coordination: Rapid alternating movements normal on left side. Finger-to-nose and heel-to-shin performed accurately on left side. Gait and Station: deferred as RW not present Reflexes: 2+ RUE and RLE, 1+ LUE and LLE. Toes downgoing.          ASSESSMENT: Felicia Chen is a 56 y.o. 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 Vascular risk factors include new dx of HTN, HLD, ?RCVS vs intracranial stenosis, COVID + 11/2020 and obesity.      PLAN:  L frontal ICH :  Residual deficit: Right spastic hemiparesis and gait impairment. Start outpatient PT/OT/SLP on 11/30. Discussed spasticity and hopeful improvement with therapies. Followed by PMR Repeat MR brain 02/2021 resolved ICH with expected evolutionary changes Continue atorvastatin 80 mg daily for secondary stroke prevention.   Discussed secondary stroke prevention measures and importance of close PCP follow up for aggressive stroke risk factor management. I have gone over the pathophysiology of stroke, warning signs and symptoms, risk factors and their management in some detail with instructions to go to the closest emergency room for symptoms of concern. ?RCVS: felt possibly triggered by sertraline and COVID infection. Has not yet completed repeat CTA head/neck Depression with anxiety: stopped sertaline due to above.  Bupropion worsened anxiety. Would be hesitant to retrial SSRI HTN: BP goal <130/90.  Stable.  Monitor by PCP HLD: LDL goal <70. Recent LDL 212.  Continue atorvastatin 80 mg daily. Repeat lipid panel. Request ongoing monitoring/management and rx'ing to PCP    Follow up in 3 months or call earlier if needed   CC:  PCP: Rankins, Bill Salinas, MD    I spent 59 minutes of face-to-face and non-face-to-face time with patient and family.  This included previsit chart review, lab review, study review, order entry, electronic health record documentation, patient and family education regarding prior stroke with residual deficits, secondary stroke prevention measures and importance of managing stroke risk factors, and answered all other questions to patient and family's satisfaction  Frann Rider, [facility]  [facility] [address] [place], [place] [address]  Phone [phone] Fax  [phone] Note: This document was prepared with digital dictation and possible smart phrase technology. Any transcriptional errors that result from this process are unintentional.

## 2021-05-04 NOTE — Telephone Encounter (Signed)
[place]: [id number] & [id number] (exp. 05/04/21 to 06/18/21) order sent to GI, they will reach out to the patient to schedule.

## 2021-05-04 NOTE — Telephone Encounter (Signed)
Pt was instructed by provider to cancel appt due pt has not had CTA completed.

## 2021-05-05 ENCOUNTER — Other Ambulatory Visit: Payer: Self-pay

## 2021-05-05 ENCOUNTER — Ambulatory Visit: Payer: 59 | Admitting: Occupational Therapy

## 2021-05-05 ENCOUNTER — Ambulatory Visit: Payer: 59

## 2021-05-05 DIAGNOSIS — I69351 Hemiplegia and hemiparesis following cerebral infarction affecting right dominant side: Secondary | ICD-10-CM

## 2021-05-05 DIAGNOSIS — M6281 Muscle weakness (generalized): Secondary | ICD-10-CM

## 2021-05-05 DIAGNOSIS — R278 Other lack of coordination: Secondary | ICD-10-CM

## 2021-05-05 NOTE — Therapy (Signed)
[facility] [address] [place], [place], [address] Phone: [phone]   Fax:  [phone]  Occupational Therapy Treatment  Patient Details  Name: Felicia Chen MRN: [id number] Date of Birth: 1965/10/21 Referring Provider (OT): Dr. Courtney Heys   Encounter Date: 05/05/2021   OT End of Session - 05/05/21 1159     Visit Number 20    Number of Visits 25    Date for OT Re-Evaluation 05/17/21    Authorization Type [employer]: VL 40 per discipline, no auth required    OT Start Time 1017    OT Stop Time 1100    OT Time Calculation (min) 43 min    Activity Tolerance Patient tolerated treatment well    Behavior During Therapy [place] for tasks assessed/performed             Past Medical History:  Diagnosis Date   Anxiety    Gilbert's syndrome     Past Surgical History:  Procedure Laterality Date   ABDOMINAL HYSTERECTOMY N/A 06/11/2012   Procedure: HYSTERECTOMY ABDOMINAL;  Surgeon: Allena Katz, MD;  Location: [facility];  Service: Gynecology;  Laterality: N/A;   CESAREAN SECTION     x 4   CYSTOSCOPY N/A 06/11/2012   Procedure: CYSTOSCOPY;  Surgeon: Allena Katz, MD;  Location: [facility];  Service: Gynecology;  Laterality: N/A;   HERNIA REPAIR     LAPAROSCOPIC ASSISTED VAGINAL HYSTERECTOMY N/A 06/11/2012   Procedure: LAPAROSCOPIC ASSISTED VAGINAL HYSTERECTOMY atttempted;  Surgeon: Allena Katz, MD;  Location: [facility];  Service: Gynecology;  Laterality: N/A;  Attempted Laparoscopic assisted vaginal hysterectomy.   LAPAROSCOPIC LYSIS OF ADHESIONS N/A 06/11/2012   Procedure: LAPAROSCOPIC LYSIS OF ADHESIONS;  Surgeon: Allena Katz, MD;  Location: [facility];  Service: Gynecology;  Laterality: N/A;   SALPINGOOPHORECTOMY Bilateral 06/11/2012   Procedure: SALPINGO OOPHORECTOMY;  Surgeon: Allena Katz, MD;  Location: [facility];  Service: Gynecology;  Laterality: Bilateral;    There were no vitals filed for this  visit.   Subjective Assessment - 05/05/21 1020     Subjective  Pool was good yesterday    Patient is accompanied by: Family member    Pertinent History ICH 12/01/20 Lt frontal/parietal lobes w/ SAH extending over bilat cerebral hemispheres and cerebellum, Lt temporal lobe ischemic CVA w/ residual Rt hemiplegia. PMH: HLD, anxiety, covid-19, Gilbert's syndrome    Limitations fall risk    Patient Stated Goals get back to normal    Currently in Pain? No/denies             Reviewed HEP ex's prone and supine with 2 lb dumbbell for further clarification (husband videotaped) for NMR RUE and scapula stability.   Prone: RUE sh extension (arm over edge of mat) with mod assist and cues to prevent compensation.  Supine: holding 2 lb dumbbell at 90* flexion RUE and performing small circumduction ex's for scapula stability  Discussed remaining goals and progress to date.  9 hole peg test Rt: 37 sec (second attempt 29 sec) Grip Rt hand = 42 lbs Discussed continuing/increasing attention to Rt side and ways to use Rt hand as dominant hand for BADLS and safe tasks.                        OT Short Term Goals - 05/02/21 1001       OT SHORT TERM GOAL #1   Title Independent with HEP for  RUE shoulder ROM, hand coordination and hand strength    Time 4    Period Weeks    Status Achieved    Target Date 03/16/21      OT SHORT TERM GOAL #2   Title Pt to perform all BADLS with no more than min assist    Time 4    Period Weeks    Status Achieved      OT SHORT TERM GOAL #3   Title Pt to write name and address with 50% or greater legibility    Time 4    Period Weeks    Status Achieved   90% legibility w/ extra time     OT SHORT TERM GOAL #4   Title Pt to prepare simple snack, sandwich, and microwaveable items consistently using DME prn with distant supervision only    Time 4    Period Weeks    Status On-going   pt req'd close supervision d/t unsteadiness with standing at  counter for reaching items and managing refrigerator     OT SHORT TERM GOAL #5   Title Pt to improve coordination as evidenced by performing 9 hole peg test Rt hand in under 1 min. and 15 sec    Baseline 1 min. 45 sec    Time 4    Period Weeks    Status Achieved   52.87 sec     OT SHORT TERM GOAL #6   Title Pt to grip strength Rt hand to 30 lbs or greater in prep for opening jars    Baseline 22 lbs (Lt = 65 lbs)    Time 4    Period Weeks    Status Achieved   36 lbs              OT Long Term Goals - 05/05/21 1200       OT LONG TERM GOAL #1   Title Independent with strengthening HEP for Rt shoulder    Time 12    Period Weeks    Status On-going      OT LONG TERM GOAL #2   Title Pt to fully attend to Rt side and return to using Rt hand as dominant hand 90% of the time safely for ADLS w/ task modifications prn    Time 12    Period Weeks    Status On-going      OT LONG TERM GOAL #3   Title Independent with all BADLS    Time 12    Period Weeks    Status On-going   assist w/ shower transfer and hooking bra     OT LONG TERM GOAL #4   Title Pt to perform simple cooking tasks with supervision/min cueing only    Time 12    Period Weeks    Status New      OT LONG TERM GOAL #5   Title Improve coordination Rt hand as evidenced by performing 9 hole peg test in 45 sec or less    Baseline 1 min. 45 sec.    Time 12    Period Weeks    Status Achieved   36.56 sec     OT LONG TERM GOAL #6   Title Pt to improve grip strength Rt hand to 40 lbs or greater to open jars/containers    Baseline 22 lbs (Lt = 65 lbs)    Time 12    Period Weeks    Status Achieved   42 LBS  OT LONG TERM GOAL #7   Title Pt to write name at 90% legibility and 3 sentences at 75% or greater legibility    Time 12    Period Weeks    Status On-going   Pt writing 3 simple sentences in print at 90% legibility     OT LONG TERM GOAL #8   Title Pt to improve coordination Rt hand as evidenced by  performing 9 hole peg test in 30 sec or less    Baseline eval =  1 min. 45 sec,  at reassessement = 36.56 sec    Period Weeks    Status New                   Plan - 05/05/21 1200     Clinical Impression Statement Pt progressing with RUE functional use and progressing steadily towards goals. Grip strength has improved.    OT Occupational Profile and History Detailed Assessment- Review of Records and additional review of physical, cognitive, psychosocial history related to current functional performance    Occupational performance deficits (Please refer to evaluation for details): ADL's;IADL's;Work;Leisure    Body Structure / Function / Physical Skills ADL;Strength;Dexterity;Balance;Tone;Body mechanics;Edema;Proprioception;UE functional use;IADL;ROM;Endurance;Mobility;Coordination;Sensation;FMC;Decreased knowledge of use of DME    Cognitive Skills Attention;Memory;Perception    Rehab Potential Good    Clinical Decision Making Several treatment options, min-mod task modification necessary    Comorbidities Affecting Occupational Performance: May have comorbidities impacting occupational performance    Modification or Assistance to Complete Evaluation  No modification of tasks or assist necessary to complete eval    OT Frequency 2x / week    OT Duration 12 weeks   PLUS EVAL   OT Treatment/Interventions Self-care/ADL training;DME and/or AE instruction;Moist Heat;Therapeutic activities;Compression bandaging;Splinting;Therapeutic exercise;Ultrasound;Cognitive remediation/compensation;Coping strategies training;Visual/perceptual remediation/compensation;Passive range of motion;Neuromuscular education;Electrical Stimulation;Cryotherapy;Manual Therapy;Patient/family education    Plan continue NMR and functional use RUE, attention to Rt side (plan to renew through end of March)    Consulted and Agree with Plan of Care Patient;Family member/caregiver    Family Member Consulted husband and daughter              Patient will benefit from skilled therapeutic intervention in order to improve the following deficits and impairments:   Body Structure / Function / Physical Skills: ADL, Strength, Dexterity, Balance, Tone, Body mechanics, Edema, Proprioception, UE functional use, IADL, ROM, Endurance, Mobility, Coordination, Sensation, FMC, Decreased knowledge of use of DME Cognitive Skills: Attention, Memory, Perception     Visit Diagnosis: Hemiplegia and hemiparesis following cerebral infarction affecting right dominant side (HCC)  Muscle weakness (generalized)  Other lack of coordination    Problem List Patient Active Problem List   Diagnosis Date Noted   Nausea without vomiting    Spastic hemiparesis (HCC)    History of hypertension    Neurogenic orthostatic hypotension (HCC)    Transaminitis    Right hemiplegia ([place])    Hemorrhagic stroke ([place])    Leucocytosis    Essential hypertension    Dyslipidemia    Right hemiparesis ([place])    ICH (intracerebral hemorrhage) ([place]) 12/01/2020   HYPERLIPIDEMIA 07/06/2009   OBESITY 07/06/2009    Carey Bullocks, OTR/L 05/05/2021, 12:02 PM  [facility] [address] [place], [place], [address] Phone: [phone]   Fax:  [phone]  Name: ALIANIS TRIMMER MRN: [id number] Date of Birth: 06-16-1965

## 2021-05-06 ENCOUNTER — Encounter: Payer: Self-pay | Admitting: Physical Medicine and Rehabilitation

## 2021-05-06 ENCOUNTER — Encounter: Payer: Self-pay | Admitting: Physical Therapy

## 2021-05-09 ENCOUNTER — Encounter: Payer: Self-pay | Admitting: Physical Therapy

## 2021-05-09 ENCOUNTER — Other Ambulatory Visit: Payer: Self-pay

## 2021-05-09 ENCOUNTER — Ambulatory Visit: Payer: 59 | Admitting: Physical Therapy

## 2021-05-09 DIAGNOSIS — M6281 Muscle weakness (generalized): Secondary | ICD-10-CM

## 2021-05-09 DIAGNOSIS — R2689 Other abnormalities of gait and mobility: Secondary | ICD-10-CM

## 2021-05-09 DIAGNOSIS — I69351 Hemiplegia and hemiparesis following cerebral infarction affecting right dominant side: Secondary | ICD-10-CM

## 2021-05-09 DIAGNOSIS — R2681 Unsteadiness on feet: Secondary | ICD-10-CM

## 2021-05-09 NOTE — Therapy (Addendum)
[facility] [address] [place], [place], [address] Phone: [phone]   Fax:  [phone]  Physical Therapy Treatment  Patient Details  Name: Felicia Chen MRN: [id number] Date of Birth: [birth year] Referring Provider (PT): Dr. Dagoberto Ligas   Encounter Date: 05/09/2021   PT End of Session - 05/09/21 0943     Visit Number 21    Number of Visits 30    Date for PT Re-Evaluation 06/10/21    Authorization Type [employer] -40 visit limit per discipline?    PT Start Time 0935    PT Stop Time 1015    PT Time Calculation (min) 40 min    Equipment Utilized During Treatment Other (comment)   aqua dumbbells   Activity Tolerance Patient tolerated treatment well    Behavior During Therapy WFL for tasks assessed/performed             Past Medical History:  Diagnosis Date   Anxiety    Gilbert's syndrome     Past Surgical History:  Procedure Laterality Date   ABDOMINAL HYSTERECTOMY N/A 06/11/2012   Procedure: HYSTERECTOMY ABDOMINAL;  Surgeon: Allena Katz, MD;  Location: [facility];  Service: Gynecology;  Laterality: N/A;   CESAREAN SECTION     x 4   CYSTOSCOPY N/A 06/11/2012   Procedure: CYSTOSCOPY;  Surgeon: Allena Katz, MD;  Location: [facility];  Service: Gynecology;  Laterality: N/A;   HERNIA REPAIR     LAPAROSCOPIC ASSISTED VAGINAL HYSTERECTOMY N/A 06/11/2012   Procedure: LAPAROSCOPIC ASSISTED VAGINAL HYSTERECTOMY atttempted;  Surgeon: Allena Katz, MD;  Location: [facility];  Service: Gynecology;  Laterality: N/A;  Attempted Laparoscopic assisted vaginal hysterectomy.   LAPAROSCOPIC LYSIS OF ADHESIONS N/A 06/11/2012   Procedure: LAPAROSCOPIC LYSIS OF ADHESIONS;  Surgeon: Allena Katz, MD;  Location: [facility];  Service: Gynecology;  Laterality: N/A;   SALPINGOOPHORECTOMY Bilateral 06/11/2012   Procedure: SALPINGO OOPHORECTOMY;  Surgeon: Allena Katz, MD;  Location: [facility];  Service: Gynecology;  Laterality:  Bilateral;    There were no vitals filed for this visit.   Subjective Assessment - 05/09/21 0938     Subjective Had a good weekend. Has not heard from [facility] yet. R ankle is feeling less stiff.    Patient is accompained by: Family member    Pertinent History anxiety, Gilbert's syndrome, obesity, HLD.    Limitations Walking;House hold activities;Standing    Patient Stated Goals wants to get back to normal.    Currently in Pain? No/denies                               [facility] Adult PT Treatment/Exercise - 05/09/21 1014       Transfers   Transfers Sit to Stand;Stand to Sit    Sit to Stand 5: Supervision    Stand to Sit 5: Supervision    Comments Pt reporting when she does her sit <> stands at home she notices she is bracing her BLE against mat table. Reviewed technique with scooting out towards edge first and incr forward lean without BLE bracing performed x10 reps, Then performed an additional 10 reps with RLE staggered posteriorly for incr weight bearing.      Ambulation/Gait   Ambulation/Gait Yes    Ambulation/Gait Assistance 5: Supervision    Ambulation/Gait Assistance Details Pt wearing R foot up brace (pt purchased this a while ago) while she waits to obtain  her new AFO. Helped tighten straps for foot clearance. Discussed holding off on wearing custom AFO for now as it was causing a red spot and making gait pattern worse.    Assistive device Rolling walker    Gait Pattern Step-through pattern;Decreased stance time - right;Decreased step length - left;Decreased hip/knee flexion - right;Decreased dorsiflexion - right;Decreased weight shift to right;Poor foot clearance - right;Right foot flat;Wide base of support;Abducted- right    Ambulation Surface Level;Indoor      Exercises   Exercises Other Exercises    Other Exercises  Per pt request- reviewed supine marching for HEP with pt's family helping to keep neutral knee alignment, and reviewed manual resistance into  hip flexion and then hip extension when kicking out x10 reps (pt has been performing this way at home with family). Performed hip internal rotation stretch in supine 3 x 30 seconds with pt with incr tightness, pt reporting feeling relief from this stretch. Discussed with pt's husband that this is a stretch that he can perform with pt at home.                 Balance Exercises - 05/09/21 1013       Balance Exercises: Standing   SLS with Vectors Solid surface    SLS with Vectors Limitations With RLE as stance leg and LLE on 6" step, 2 sets of 3 reps head turns, cued to keep a soft bend in RLE.    Standing, One Foot on a Step Eyes open;6 inch    Standing, One Foot on a Step Limitations With RLE as stance leg, tapping LLE to 6" step x15 reps, beginning with UE support > none. Verbal/tactile cues for a soft bend in RLE.    Step Ups Forward;6 inch;Intermittent UE support;UE support 2    Step Ups Limitations Keeping RLE on step (therapist needing to place it in neutral position) - x10 reps stepping LLE up and down with BUE > single UE support. Then an additional x10 reps with marching up LLE for more RLE SLS with BUE support.                  PT Short Term Goals - 04/14/21 1111       PT SHORT TERM GOAL #1   Title Pt will be independent with progressive HEP in order to build upon functional gains made in therapy (ALL STGs Due: 05/12/21)    Baseline family not present today to review HEP, but pt reports that performing consistently at home, will benefit from family review in future session    Time 4    Period Weeks    Status New    Target Date 05/12/21      PT SHORT TERM GOAL #2   Title Patient will improve Berg Balance to >/= 43/56 to demo improved balance and reduced fall risk    Baseline 36/56 initial assessment; 41/56    Time 4    Period Weeks    Status Revised      PT SHORT TERM GOAL #3   Title Pt will improve gait speed to at least 1.85 ft/sec in order to demo improved  household mobility.    Baseline .90 ft/sec with RW; 1.73 ft/sec    Time 4    Period Weeks    Status Revised      PT SHORT TERM GOAL #4   Title Pt will ambulate at least >/= 600 ft with RW and supervision in order to demo  improved household mobility.    Baseline 230 w/ RW and CGA/MinA    Time 4    Period Weeks    Status Revised      PT SHORT TERM GOAL #5   Title Pt will initiate Aquatic PT services and initiate Aquatic HEP    Baseline Referral for Aquatic placed    Time 4    Period Weeks    Status New               PT Long Term Goals - 04/14/21 1115       PT LONG TERM GOAL #1   Title Pt will be independent with land and aquatic HEP in with family supervision in order to build upon functional gains made in therapy. ALL LTGS DUE 06/10/21    Baseline independent with current HEP; will benefit from progressive HEP    Time 8    Period Weeks    Status Revised    Target Date 06/10/21      PT LONG TERM GOAL #2   Title Patient will improve Berg Balance to >/= 46/56 to demo reduced fall risk and imbalance    Baseline 41/56    Time 8    Period Weeks    Status Revised      PT LONG TERM GOAL #3   Title Pt will ambulate at least 500' outdoors with LRAD and supervision in order to demo improved community mobility.    Baseline 450' outdors with RW CGA/supervision    Time 8    Period Weeks    Status Revised      PT LONG TERM GOAL #4   Title Pt will go up and down 12 stairs with single handrail with supervision/min guard with step to pattern in order to safely go up/down the stairs in her house.    Baseline not yet assessed; 12 stairs with CGA/supervision bil rails    Time 8    Period Weeks    Status On-going      PT LONG TERM GOAL #5   Title Pt will perform 5x sit <> stand with UE support in 12 seconds or less with supervision in order to demo decr fall risk.    Baseline 23.69 seconds with min guard; 14.44 secs with UE support    Time 8    Period Weeks    Status Revised       PT LONG TERM GOAL #6   Title Pt will improve gait speed to at least 2.0 ft/sec in order to demo improved household mobility and decr fall risk.    Baseline .90 ft/sec with RW; 1.73 ft/sec    Time 8    Period Weeks    Status Revised                   Plan - 05/09/21 1334     Clinical Impression Statement Reviewed supine hip flexor strengthening exercises for HEP per pt's request. Also added in supine R hip IR stretch for pt's family to perform due to hypomobility and tightnes. Worked on standing balance with SLS tasks on RLE. Pt initially needing UE support and then progressing to none when working on LLE taps on 6" step (improvement from 4" step). Needing tactile/verbal cues to prevent hyperextension of RLE during tasks for improved knee control.  Will continue to progress towards LTGs.    Personal Factors and Comorbidities Comorbidity 3+;Past/Current Experience;Time since onset of injury/illness/exacerbation    Comorbidities anxiety, Gilbert's syndrome, obesity,  HLD    Examination-Activity Limitations Bathing;Bend;Caring for Others;Toileting;Stand;Stairs;Squat;Reach Overhead;Locomotion Level;Transfers    Examination-Participation Restrictions Cleaning;Community Activity;Driving;Medication Management    Stability/Clinical Decision Making Evolving/Moderate complexity    Rehab Potential Good    PT Frequency 2x / week    PT Duration 8 weeks    PT Treatment/Interventions ADLs/Self Care Home Management;Electrical Stimulation;DME Instruction;Gait training;Stair training;Functional mobility training;Therapeutic activities;Aquatic Therapy;Therapeutic exercise;Balance training;Neuromuscular re-education;Manual techniques;Orthotic Fit/Training;Patient/family education;Passive range of motion;Energy conservation;Vestibular;Visual/perceptual remediation/compensation    PT Next Visit Plan NEED TO CHECK STGS. work on floor transfers again (but just trying to get directly back up instead of  performing exercises first). RLE NMR and strengthening (esp R hip).  Gait training with RW and brace (any update with [employer]?). SciFit, standing balance.  Aquatic: gastroc/HS stretches, R SLS, step ups, R hip strengthening, tall kneeling on first step, supine float <> standing    PT Home Exercise Plan [id number]    Consulted and Agree with Plan of Care Patient;Family member/caregiver             Patient will benefit from skilled therapeutic intervention in order to improve the following deficits and impairments:  Abnormal gait, Decreased activity tolerance, Decreased balance, Decreased cognition, Decreased endurance, Decreased coordination, Decreased range of motion, Decreased safety awareness, Decreased strength, Hypomobility, Increased edema, Impaired sensation, Postural dysfunction, Impaired flexibility, Impaired tone, Difficulty walking, Impaired UE functional use, Impaired vision/preception  Visit Diagnosis: Hemiplegia and hemiparesis following cerebral infarction affecting right dominant side (HCC)  Muscle weakness (generalized)  Unsteadiness on feet  Other abnormalities of gait and mobility     Problem List Patient Active Problem List   Diagnosis Date Noted   Nausea without vomiting    Spastic hemiparesis (HCC)    History of hypertension    Neurogenic orthostatic hypotension (HCC)    Transaminitis    Right hemiplegia ([facility])    Hemorrhagic stroke (HCC)    Leucocytosis    Essential hypertension    Dyslipidemia    Right hemiparesis (HCC)    ICH (intracerebral hemorrhage) ([facility]) 12/01/2020   HYPERLIPIDEMIA 07/06/2009   OBESITY 07/06/2009    Arliss Journey, PT, DPT  05/09/2021, 1:47 PM  [facility] [address] [place], [place], [address] Phone: [phone]   Fax:  [phone]  Name: Felicia Chen MRN: [id number] Date of Birth: 07-13-1965

## 2021-05-10 ENCOUNTER — Ambulatory Visit: Payer: 59 | Admitting: Physical Therapy

## 2021-05-10 ENCOUNTER — Ambulatory Visit: Payer: 59 | Admitting: Occupational Therapy

## 2021-05-10 DIAGNOSIS — M6281 Muscle weakness (generalized): Secondary | ICD-10-CM

## 2021-05-10 DIAGNOSIS — R29818 Other symptoms and signs involving the nervous system: Secondary | ICD-10-CM

## 2021-05-10 DIAGNOSIS — I69351 Hemiplegia and hemiparesis following cerebral infarction affecting right dominant side: Secondary | ICD-10-CM | POA: Diagnosis not present

## 2021-05-10 DIAGNOSIS — R2689 Other abnormalities of gait and mobility: Secondary | ICD-10-CM

## 2021-05-10 DIAGNOSIS — R2681 Unsteadiness on feet: Secondary | ICD-10-CM

## 2021-05-10 NOTE — Therapy (Signed)
[facility] [address] [place], [place], [address] Phone: [phone]   Fax:  [phone]  Physical Therapy Treatment  Patient Details  Name: Felicia Chen MRN: [id number] Date of Birth: April 27, 1965 Referring Provider (PT): Dr. Dagoberto Ligas   Encounter Date: 05/10/2021   PT End of Session - 05/10/21 1332     Visit Number 22    Number of Visits 30    Date for PT Re-Evaluation 06/10/21    Authorization Type UHC -40 visit limit per discipline?    PT Start Time 0845    PT Stop Time 0929    PT Time Calculation (min) 44 min    Equipment Utilized During Treatment Other (comment)   aqua dumbbells   Activity Tolerance Patient tolerated treatment well    Behavior During Therapy WFL for tasks assessed/performed             Past Medical History:  Diagnosis Date   Anxiety    Gilbert's syndrome     Past Surgical History:  Procedure Laterality Date   ABDOMINAL HYSTERECTOMY N/A 06/11/2012   Procedure: HYSTERECTOMY ABDOMINAL;  Surgeon: Allena Katz, MD;  Location: [facility];  Service: Gynecology;  Laterality: N/A;   CESAREAN SECTION     x 4   CYSTOSCOPY N/A 06/11/2012   Procedure: CYSTOSCOPY;  Surgeon: Allena Katz, MD;  Location: [facility];  Service: Gynecology;  Laterality: N/A;   HERNIA REPAIR     LAPAROSCOPIC ASSISTED VAGINAL HYSTERECTOMY N/A 06/11/2012   Procedure: LAPAROSCOPIC ASSISTED VAGINAL HYSTERECTOMY atttempted;  Surgeon: Allena Katz, MD;  Location: [facility];  Service: Gynecology;  Laterality: N/A;  Attempted Laparoscopic assisted vaginal hysterectomy.   LAPAROSCOPIC LYSIS OF ADHESIONS N/A 06/11/2012   Procedure: LAPAROSCOPIC LYSIS OF ADHESIONS;  Surgeon: Allena Katz, MD;  Location: [facility];  Service: Gynecology;  Laterality: N/A;   SALPINGOOPHORECTOMY Bilateral 06/11/2012   Procedure: SALPINGO OOPHORECTOMY;  Surgeon: Allena Katz, MD;  Location: [facility];  Service: Gynecology;  Laterality:  Bilateral;    There were no vitals filed for this visit.   Subjective Assessment - 05/10/21 1329     Subjective Ankle has stretched so much it might feel "too loose."  Looking forwards to getting AFO.  Has not been wearing plastic molded AFO.    Patient is accompained by: Family member    Pertinent History anxiety, Gilbert's syndrome, obesity, HLD.    Limitations Walking;House hold activities;Standing    Patient Stated Goals wants to get back to normal.    Currently in Pain? No/denies              Patient seen for aquatic therapy today.  Treatment took place in water 3-4 feet deep depending upon activity.  Donned air cast on RLE and lace up sneakers.  Pt entered and exited the pool via stairs with UE support on rails.  When ascending at end of session pt able to advance RLE and push up to next step with min-mod A as water level decreased.  Gastroc/Soleus stretch: functional stretch descending stairs to enter water, performed step downs with LLE, lowering on RLE.  Also performed straight leg gastroc stretch standing on 1st step dropping R heel off back of stair.  Transitioned to R hamstring stretch with R heel on 1st step, leaning over RLE while straightening R knee.  Standing with back to the wall and UE in "T", PT assisted with lifting/flexing RLE and crossing knee across midline while  pt performed head and trunk counter rotation to L with 20 second hold x 2 reps - discussed how pt can perform in supine at home.    Performed gait training forwards across width of pool holding aqua dumbbells in each UE out to each side.  PT provided verbal cues to shorten R step length to allow for increased weight shift over RLE during stance phase and increased L step length.  Transitioned to side stepping to L and R with PT providing verbal cues for sequencing and lateral weight shifting.  Transitioned back to forwards walking with high knee marching to increase stance time and SLS on RLE and hip flexion  on RLE.  When lifting RLE pt noted to have involuntary hip ER with hip flexion causing more narrow BOS when placing foot back down.  Stood at wall and focused on isolated hip IR activation on RLE with hip and knee in flexion; required total verbal, tactile cues from PT.  Returned to high knee marching with improvement in hip IR activation when performing hip/knee flexion on RLE.  Transitioned to half kneeling at stairs with R foot on second step, L knee down on 1st step.  Performed multiple weight shifts back to LLE while activating/isolating R hip flexion to lift R foot off of step.    Pt requires buoyancy of water for support for joint offloading for partial body weight support during gait training, to reduce fall risk with gait training and balance exercises with minimal UE support; exercises able to be performed safely in water without the risk of fall compared to those same exercises performed on land;  viscosity of water needed for resistance for strengthening.  Current of water provides perturbations for challenging static & dynamic standing balance.    PT Education - 05/10/21 1331     Education Details How to perform LE rotational stretch to improve RLE hip IR ROM    Person(s) Educated Patient    Methods Explanation;Demonstration    Comprehension Verbalized understanding;Returned demonstration              PT Short Term Goals - 05/10/21 1332       PT SHORT TERM GOAL #1   Title Pt will be independent with progressive HEP in order to build upon functional gains made in therapy (ALL STGs Due: 05/12/21)    Baseline family not present today to review HEP, but pt reports that performing consistently at home, will benefit from family review in future session    Time 4    Period Weeks    Status New    Target Date 05/12/21      PT SHORT TERM GOAL #2   Title Patient will improve Berg Balance to >/= 43/56 to demo improved balance and reduced fall risk    Baseline 36/56 initial  assessment; 41/56    Time 4    Period Weeks    Status Revised      PT SHORT TERM GOAL #3   Title Pt will improve gait speed to at least 1.85 ft/sec in order to demo improved household mobility.    Baseline .90 ft/sec with RW; 1.73 ft/sec    Time 4    Period Weeks    Status Revised      PT SHORT TERM GOAL #4   Title Pt will ambulate at least >/= 600 ft with RW and supervision in order to demo improved household mobility.    Baseline 230 w/ RW and CGA/MinA  Time 4    Period Weeks    Status Revised      PT SHORT TERM GOAL #5   Title Pt will initiate Aquatic PT services and initiate Aquatic HEP    Time 4    Period Weeks    Status Achieved               PT Long Term Goals - 04/14/21 1115       PT LONG TERM GOAL #1   Title Pt will be independent with land and aquatic HEP in with family supervision in order to build upon functional gains made in therapy. ALL LTGS DUE 06/10/21    Baseline independent with current HEP; will benefit from progressive HEP    Time 8    Period Weeks    Status Revised    Target Date 06/10/21      PT LONG TERM GOAL #2   Title Patient will improve Berg Balance to >/= 46/56 to demo reduced fall risk and imbalance    Baseline 41/56    Time 8    Period Weeks    Status Revised      PT LONG TERM GOAL #3   Title Pt will ambulate at least 500' outdoors with LRAD and supervision in order to demo improved community mobility.    Baseline 450' outdors with RW CGA/supervision    Time 8    Period Weeks    Status Revised      PT LONG TERM GOAL #4   Title Pt will go up and down 12 stairs with single handrail with supervision/min guard with step to pattern in order to safely go up/down the stairs in her house.    Baseline not yet assessed; 12 stairs with CGA/supervision bil rails    Time 8    Period Weeks    Status On-going      PT LONG TERM GOAL #5   Title Pt will perform 5x sit <> stand with UE support in 12 seconds or less with supervision in  order to demo decr fall risk.    Baseline 23.69 seconds with min guard; 14.44 secs with UE support    Time 8    Period Weeks    Status Revised      PT LONG TERM GOAL #6   Title Pt will improve gait speed to at least 2.0 ft/sec in order to demo improved household mobility and decr fall risk.    Baseline .90 ft/sec with RW; 1.73 ft/sec    Time 8    Period Weeks    Status Revised                   Plan - 05/10/21 1333     Clinical Impression Statement Continued to utilize aquatic environment to address RLE weakness, ROM limitations, weight shifting, and stance phase control during gait training.  Required significant tactile, verbal and visual cues to activate R hip internal rotation.  Will continue to address and progress towards LTG.    Personal Factors and Comorbidities Comorbidity 3+;Past/Current Experience;Time since onset of injury/illness/exacerbation    Comorbidities anxiety, Gilbert's syndrome, obesity, HLD    Examination-Activity Limitations Bathing;Bend;Caring for Others;Toileting;Stand;Stairs;Squat;Reach Overhead;Locomotion Level;Transfers    Examination-Participation Restrictions Cleaning;Community Activity;Driving;Medication Management    Stability/Clinical Decision Making Evolving/Moderate complexity    Rehab Potential Good    PT Frequency 2x / week    PT Duration 8 weeks    PT Treatment/Interventions ADLs/Self Care Home Management;Electrical Stimulation;DME Instruction;Gait training;Stair training;Functional  mobility training;Therapeutic activities;Aquatic Therapy;Therapeutic exercise;Balance training;Neuromuscular re-education;Manual techniques;Orthotic Fit/Training;Patient/family education;Passive range of motion;Energy conservation;Vestibular;Visual/perceptual remediation/compensation    PT Next Visit Plan NEED TO CHECK STGS. work on floor transfers again (but just trying to get directly back up instead of performing exercises first). RLE NMR and strengthening (esp  R hip).  Gait training with RW and brace (any update with [employer]?). SciFit, standing balance.  Aquatic: gastroc/HS stretches, R SLS, step ups, R hip IR strengthening, tall kneeling on first step.    PT Home Exercise Plan [id number]    Consulted and Agree with Plan of Care Patient;Family member/caregiver             Patient will benefit from skilled therapeutic intervention in order to improve the following deficits and impairments:  Abnormal gait, Decreased activity tolerance, Decreased balance, Decreased cognition, Decreased endurance, Decreased coordination, Decreased range of motion, Decreased safety awareness, Decreased strength, Hypomobility, Increased edema, Impaired sensation, Postural dysfunction, Impaired flexibility, Impaired tone, Difficulty walking, Impaired UE functional use, Impaired vision/preception  Visit Diagnosis: Hemiplegia and hemiparesis following cerebral infarction affecting right dominant side (HCC)  Muscle weakness (generalized)  Unsteadiness on feet  Other abnormalities of gait and mobility  Other symptoms and signs involving the nervous system     Problem List Patient Active Problem List   Diagnosis Date Noted   Nausea without vomiting    Spastic hemiparesis (HCC)    History of hypertension    Neurogenic orthostatic hypotension (HCC)    Transaminitis    Right hemiplegia ([facility])    Hemorrhagic stroke (HCC)    Leucocytosis    Essential hypertension    Dyslipidemia    Right hemiparesis (HCC)    ICH (intracerebral hemorrhage) ([place]) 12/01/2020   HYPERLIPIDEMIA 07/06/2009   OBESITY 07/06/2009    Felicia Chen, PT, DPT 05/10/21    1:54 PM    [facility] [address] [place], [place], [address] Phone: [phone]   Fax:  [phone]  Name: Felicia Chen MRN: [id number] Date of Birth: [birth year]

## 2021-05-11 ENCOUNTER — Ambulatory Visit: Payer: 59 | Admitting: Adult Health

## 2021-05-11 ENCOUNTER — Ambulatory Visit: Payer: 59 | Admitting: Physical Therapy

## 2021-05-12 ENCOUNTER — Other Ambulatory Visit: Payer: Self-pay

## 2021-05-12 ENCOUNTER — Ambulatory Visit: Payer: 59 | Admitting: Occupational Therapy

## 2021-05-12 ENCOUNTER — Ambulatory Visit: Payer: 59 | Admitting: Physical Therapy

## 2021-05-12 DIAGNOSIS — I69351 Hemiplegia and hemiparesis following cerebral infarction affecting right dominant side: Secondary | ICD-10-CM | POA: Diagnosis not present

## 2021-05-12 DIAGNOSIS — M6281 Muscle weakness (generalized): Secondary | ICD-10-CM

## 2021-05-12 DIAGNOSIS — R278 Other lack of coordination: Secondary | ICD-10-CM

## 2021-05-12 NOTE — Therapy (Signed)
[facility] [address] [place], [place], [address] Phone: [phone]   Fax:  [phone]  Occupational Therapy Treatment  Patient Details  Name: Felicia Chen MRN: [id number] Date of Birth: 06/04/1965 Referring Provider (OT): Dr. Courtney Heys   Encounter Date: 05/12/2021   OT End of Session - 05/12/21 1213     Visit Number 21    Number of Visits 25    Date for OT Re-Evaluation 05/17/21    Authorization Type UHC: VL 40 per discipline, no auth required    OT Start Time 0932    OT Stop Time 1015    OT Time Calculation (min) 43 min    Activity Tolerance Patient tolerated treatment well    Behavior During Therapy [facility] for tasks assessed/performed             Past Medical History:  Diagnosis Date   Anxiety    Gilbert's syndrome     Past Surgical History:  Procedure Laterality Date   ABDOMINAL HYSTERECTOMY N/A 06/11/2012   Procedure: HYSTERECTOMY ABDOMINAL;  Surgeon: Allena Katz, MD;  Location: [facility];  Service: Gynecology;  Laterality: N/A;   CESAREAN SECTION     x 4   CYSTOSCOPY N/A 06/11/2012   Procedure: CYSTOSCOPY;  Surgeon: Allena Katz, MD;  Location: [facility];  Service: Gynecology;  Laterality: N/A;   HERNIA REPAIR     LAPAROSCOPIC ASSISTED VAGINAL HYSTERECTOMY N/A 06/11/2012   Procedure: LAPAROSCOPIC ASSISTED VAGINAL HYSTERECTOMY atttempted;  Surgeon: Allena Katz, MD;  Location: [facility];  Service: Gynecology;  Laterality: N/A;  Attempted Laparoscopic assisted vaginal hysterectomy.   LAPAROSCOPIC LYSIS OF ADHESIONS N/A 06/11/2012   Procedure: LAPAROSCOPIC LYSIS OF ADHESIONS;  Surgeon: Allena Katz, MD;  Location: [facility];  Service: Gynecology;  Laterality: N/A;   SALPINGOOPHORECTOMY Bilateral 06/11/2012   Procedure: SALPINGO OOPHORECTOMY;  Surgeon: Allena Katz, MD;  Location: [facility];  Service: Gynecology;  Laterality: Bilateral;    There were no vitals filed for this  visit.   Subjective Assessment - 05/12/21 0936     Subjective  The exercises on my stomach are going well. 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 PMH: HLD, anxiety, covid-19, Gilbert's syndrome    Limitations fall risk    Patient Stated Goals get back to normal    Currently in Pain? No/denies   just stiffness             Pt had questions re: which ex's she could do at home vs. Only in clinic. Therapist clarified. Also discussed POC and reducing down to 1x/wk beginning in March with anticipation of d/c at end of March. Pt had concerns, however therapist assured her that this doesn't mean she will stop improving, but rather she can continue to work on things at home.  Practiced writing 3 sentences in print with 100% legibility, however insufficient spacing b/t 2 words. Pt signed name x 3 in cursive w/ 90% legibility  Supine: BUE sh flexion holding 2 lb dumbbell (palms facing) w/ cues to prevent compensations. Unilateral circumduction at 90* sh flexion RUE w/ 2 lb weight and min to mod assist. Standing: closed chain circumduction for sh girdle control/stabilization w/ ball along wall and min assist  OT Short Term Goals - 05/02/21 1001       OT SHORT TERM GOAL #1   Title Independent with HEP for RUE shoulder ROM, hand coordination and hand strength    Time 4    Period Weeks    Status Achieved    Target Date 03/16/21      OT SHORT TERM GOAL #2   Title Pt to perform all BADLS with no more than min assist    Time 4    Period Weeks    Status Achieved      OT SHORT TERM GOAL #3   Title Pt to write name and address with 50% or greater legibility    Time 4    Period Weeks    Status Achieved   90% legibility w/ extra time      OT SHORT TERM GOAL #4   Title Pt to prepare simple snack, sandwich, and microwaveable items consistently using DME prn with distant supervision only    Time 4    Period Weeks    Status On-going   pt req'd close supervision d/t unsteadiness with standing at counter for reaching items and managing refrigerator     OT SHORT TERM GOAL #5   Title Pt to improve coordination as evidenced by performing 9 hole peg test Rt hand in under 1 min. and 15 sec    Baseline 1 min. 45 sec    Time 4    Period Weeks    Status Achieved   52.87 sec     OT SHORT TERM GOAL #6   Title Pt to grip strength Rt hand to 30 lbs or greater in prep for opening jars    Baseline 22 lbs (Lt = 65 lbs)    Time 4    Period Weeks    Status Achieved   36 lbs              OT Long Term Goals - 05/05/21 1200       OT LONG TERM GOAL #1   Title Independent with strengthening HEP for Rt shoulder    Time 12    Period Weeks    Status On-going      OT LONG TERM GOAL #2   Title Pt to fully attend to Rt side and return to using Rt hand as dominant hand 90% of the time safely for ADLS w/ task modifications prn    Time 12    Period Weeks    Status On-going      OT LONG TERM GOAL #3   Title Independent with all BADLS    Time 12    Period Weeks    Status On-going   assist w/ shower transfer and hooking bra     OT LONG TERM GOAL #4   Title Pt to perform simple cooking tasks with supervision/min cueing only    Time 12    Period Weeks    Status New      OT LONG TERM GOAL #5   Title Improve coordination Rt hand as evidenced by performing 9 hole peg test in 45 sec or less    Baseline 1 min. 45 sec.    Time 12    Period Weeks    Status Achieved   36.56 sec     OT LONG TERM GOAL #6   Title Pt to improve grip strength Rt hand to 40 lbs or greater to open jars/containers    Baseline 22 lbs (Lt =  65 lbs)    Time 12    Period Weeks    Status Achieved   42 LBS     OT LONG TERM GOAL #7   Title Pt to write name at  90% legibility and 3 sentences at 75% or greater legibility    Time 12    Period Weeks    Status On-going   Pt writing 3 simple sentences in print at 90% legibility     OT LONG TERM GOAL #8   Title Pt to improve coordination Rt hand as evidenced by performing 9 hole peg test in 30 sec or less    Baseline eval =  1 min. 45 sec,  at reassessement = 36.56 sec    Period Weeks    Status New                   Plan - 05/12/21 1214     Clinical Impression Statement Pt progressing with RUE functional use and progressing steadily towards goals.    OT Occupational Profile and History Detailed Assessment- Review of Records and additional review of physical, cognitive, psychosocial history related to current functional performance    Occupational performance deficits (Please refer to evaluation for details): ADL's;IADL's;Work;Leisure    Body Structure / Function / Physical Skills ADL;Strength;Dexterity;Balance;Tone;Body mechanics;Edema;Proprioception;UE functional use;IADL;ROM;Endurance;Mobility;Coordination;Sensation;FMC;Decreased knowledge of use of DME    Cognitive Skills Attention;Memory;Perception    Rehab Potential Good    Clinical Decision Making Several treatment options, min-mod task modification necessary    Comorbidities Affecting Occupational Performance: May have comorbidities impacting occupational performance    Modification or Assistance to Complete Evaluation  No modification of tasks or assist necessary to complete eval    OT Frequency 2x / week    OT Duration 12 weeks   PLUS EVAL   OT Treatment/Interventions Self-care/ADL training;DME and/or AE instruction;Moist Heat;Therapeutic activities;Compression bandaging;Splinting;Therapeutic exercise;Ultrasound;Cognitive remediation/compensation;Coping strategies training;Visual/perceptual remediation/compensation;Passive range of motion;Neuromuscular education;Electrical Stimulation;Cryotherapy;Manual Therapy;Patient/family education     Plan Renewal next week - check progress towards LTG's (will be seen 1x/wk through end of March)    Consulted and Agree with Plan of Care Patient;Family member/caregiver    Family Member Consulted husband and daughter             Patient will benefit from skilled therapeutic intervention in order to improve the following deficits and impairments:   Body Structure / Function / Physical Skills: ADL, Strength, Dexterity, Balance, Tone, Body mechanics, Edema, Proprioception, UE functional use, IADL, ROM, Endurance, Mobility, Coordination, Sensation, FMC, Decreased knowledge of use of DME Cognitive Skills: Attention, Memory, Perception     Visit Diagnosis: Hemiplegia and hemiparesis following cerebral infarction affecting right dominant side (HCC)  Muscle weakness (generalized)  Other lack of coordination    Problem List Patient Active Problem List   Diagnosis Date Noted   Nausea without vomiting    Spastic hemiparesis (HCC)    History of hypertension    Neurogenic orthostatic hypotension (HCC)    Transaminitis    Right hemiplegia ([place])    Hemorrhagic stroke ([place])    Leucocytosis    Essential hypertension    Dyslipidemia    Right hemiparesis ([place])    ICH (intracerebral hemorrhage) ([place]) 12/01/2020   HYPERLIPIDEMIA 07/06/2009   OBESITY 07/06/2009    Carey Bullocks, OTR/L 05/12/2021, 12:16 PM  [facility] [address] [place], [place], [address] Phone: [phone]   Fax:  [phone]  Name: CHRISTIONA SIDDIQUE MRN: [id number] Date of Birth: [birth year]

## 2021-05-16 ENCOUNTER — Ambulatory Visit: Payer: 59 | Admitting: Physical Therapy

## 2021-05-16 ENCOUNTER — Encounter: Payer: Self-pay | Admitting: Occupational Therapy

## 2021-05-16 ENCOUNTER — Other Ambulatory Visit: Payer: Self-pay

## 2021-05-16 ENCOUNTER — Ambulatory Visit: Payer: 59 | Admitting: Occupational Therapy

## 2021-05-16 DIAGNOSIS — I69318 Other symptoms and signs involving cognitive functions following cerebral infarction: Secondary | ICD-10-CM

## 2021-05-16 DIAGNOSIS — R29818 Other symptoms and signs involving the nervous system: Secondary | ICD-10-CM

## 2021-05-16 DIAGNOSIS — I69351 Hemiplegia and hemiparesis following cerebral infarction affecting right dominant side: Secondary | ICD-10-CM

## 2021-05-16 DIAGNOSIS — R2681 Unsteadiness on feet: Secondary | ICD-10-CM

## 2021-05-16 DIAGNOSIS — R41842 Visuospatial deficit: Secondary | ICD-10-CM

## 2021-05-16 DIAGNOSIS — R278 Other lack of coordination: Secondary | ICD-10-CM

## 2021-05-16 NOTE — Therapy (Signed)
[facility] [address] [place], [place], [address] Phone: [phone]   Fax:  [phone]  Occupational Therapy Treatment  Patient Details  Name: Felicia Chen MRN: [id number] Date of Birth: 04/22/1965 Referring Provider (OT): Dr. Courtney Heys   Encounter Date: 05/16/2021   OT End of Session - 05/16/21 1023     Visit Number 22    Number of Visits 25    Date for OT Re-Evaluation 05/17/21    Authorization Type UHC: VL 40 per discipline, no auth required    OT Start Time 1022    OT Stop Time 1102    OT Time Calculation (min) 40 min    Activity Tolerance Patient tolerated treatment well    Behavior During Therapy [facility] for tasks assessed/performed             Past Medical History:  Diagnosis Date   Anxiety    Gilbert's syndrome     Past Surgical History:  Procedure Laterality Date   ABDOMINAL HYSTERECTOMY N/A 06/11/2012   Procedure: HYSTERECTOMY ABDOMINAL;  Surgeon: Allena Katz, MD;  Location: [facility];  Service: Gynecology;  Laterality: N/A;   CESAREAN SECTION     x 4   CYSTOSCOPY N/A 06/11/2012   Procedure: CYSTOSCOPY;  Surgeon: Allena Katz, MD;  Location: [facility];  Service: Gynecology;  Laterality: N/A;   HERNIA REPAIR     LAPAROSCOPIC ASSISTED VAGINAL HYSTERECTOMY N/A 06/11/2012   Procedure: LAPAROSCOPIC ASSISTED VAGINAL HYSTERECTOMY atttempted;  Surgeon: Allena Katz, MD;  Location: [facility];  Service: Gynecology;  Laterality: N/A;  Attempted Laparoscopic assisted vaginal hysterectomy.   LAPAROSCOPIC LYSIS OF ADHESIONS N/A 06/11/2012   Procedure: LAPAROSCOPIC LYSIS OF ADHESIONS;  Surgeon: Allena Katz, MD;  Location: [facility];  Service: Gynecology;  Laterality: N/A;   SALPINGOOPHORECTOMY Bilateral 06/11/2012   Procedure: SALPINGO OOPHORECTOMY;  Surgeon: Allena Katz, MD;  Location: [facility];  Service: Gynecology;  Laterality: Bilateral;    There were no vitals filed for this  visit.   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 PMH: HLD, anxiety, covid-19, Gilbert's syndrome    Limitations fall risk    Patient Stated Goals get back to normal    Currently in Pain? No/denies              Placing [place] pegs in pegboard with tweezers with mod difficulty/cues for technique/problem-solving and coordination.  Functional reaching in standing to place/remove clothespins with 1-8lbs resistance on vertical pole with mod cueing for shoulder compensation.  Sitting, performing mid-range functional reach to place small pegs in vertical pegboard for mid-level reach with mod cueing for shoulder compensation and difficulty with coordination, 1 v.c. for copying design correctly.  Checked 9-hole peg test and began discussing progress towards remaining goals--see below.       OT Short Term Goals - 05/16/21 1029       OT SHORT TERM GOAL #1   Title Independent with HEP for RUE shoulder ROM, hand coordination and hand strength    Time 4    Period Weeks    Status Achieved    Target Date 03/16/21  OT SHORT TERM GOAL #2   Title Pt to perform all BADLS with no more than min assist    Time 4    Period Weeks    Status Achieved      OT SHORT TERM GOAL #3   Title Pt to write name and address with 50% or greater legibility    Time 4    Period Weeks    Status Achieved   90% legibility w/ extra time     OT SHORT TERM GOAL #4   Title Pt to prepare simple snack, sandwich, and microwaveable items consistently using DME prn with distant supervision only    Time 4    Period Weeks    Status Achieved   pt req'd close supervision d/t unsteadiness with standing  at counter for reaching items and managing refrigerator.  05/16/21  Met at this level     OT SHORT TERM GOAL #5   Title Pt to improve coordination as evidenced by performing 9 hole peg test Rt hand in under 1 min. and 15 sec    Baseline 1 min. 45 sec    Time 4    Period Weeks    Status Achieved   52.87 sec     OT SHORT TERM GOAL #6   Title Pt to grip strength Rt hand to 30 lbs or greater in prep for opening jars    Baseline 22 lbs (Lt = 65 lbs)    Time 4    Period Weeks    Status Achieved   36 lbs              OT Long Term Goals - 05/16/21 1025       OT LONG TERM GOAL #1   Title Independent with strengthening HEP for Rt shoulder    Time 12    Period Weeks    Status On-going      OT LONG TERM GOAL #2   Title Pt to fully attend to Rt side and return to using Rt hand as dominant hand 90% of the time safely for ADLS w/ task modifications prn    Time 12    Period Weeks    Status On-going      OT LONG TERM GOAL #3   Title Independent with all BADLS    Time 12    Period Weeks    Status On-going   assist w/ shower transfer and hooking bra     OT LONG TERM GOAL #4   Title Pt to perform simple cooking tasks with supervision/min cueing only    Time 12    Period Weeks    Status New      OT LONG TERM GOAL #5   Title Improve coordination Rt hand as evidenced by performing 9 hole peg test in 45 sec or less    Baseline 1 min. 45 sec.    Time 12    Period Weeks    Status Achieved   36.56 sec     OT LONG TERM GOAL #6   Title Pt to improve grip strength Rt hand to 40 lbs or greater to open jars/containers    Baseline 22 lbs (Lt = 65 lbs)    Time 12    Period Weeks    Status Achieved   42 LBS     OT LONG TERM GOAL #7   Title Pt to write name at 90% legibility and 3 sentences at 75% or greater legibility    Time  12    Period Weeks    Status Achieved      OT LONG TERM GOAL #8   Title Pt to improve coordination Rt hand as evidenced by performing 9 hole peg test in 30  sec or less    Baseline eval =  1 min. 45 sec,  at reassessement = 36.56 sec    Period Weeks    Status On-going   05/16/21:  30.16sec, 31.47sec                  Plan - 05/16/21 1024     Clinical Impression Statement Pt progressing with RUE functional use and progressing steadily towards goals.    OT Occupational Profile and History Detailed Assessment- Review of Records and additional review of physical, cognitive, psychosocial history related to current functional performance    Occupational performance deficits (Please refer to evaluation for details): ADL's;IADL's;Work;Leisure    Body Structure / Function / Physical Skills ADL;Strength;Dexterity;Balance;Tone;Body mechanics;Edema;Proprioception;UE functional use;IADL;ROM;Endurance;Mobility;Coordination;Sensation;FMC;Decreased knowledge of use of DME    Cognitive Skills Attention;Memory;Perception    Rehab Potential Good    Clinical Decision Making Several treatment options, min-mod task modification necessary    Comorbidities Affecting Occupational Performance: May have comorbidities impacting occupational performance    Modification or Assistance to Complete Evaluation  No modification of tasks or assist necessary to complete eval    OT Frequency 2x / week    OT Duration 12 weeks   PLUS EVAL   OT Treatment/Interventions Self-care/ADL training;DME and/or AE instruction;Moist Heat;Therapeutic activities;Compression bandaging;Splinting;Therapeutic exercise;Ultrasound;Cognitive remediation/compensation;Coping strategies training;Visual/perceptual remediation/compensation;Passive range of motion;Neuromuscular education;Electrical Stimulation;Cryotherapy;Manual Therapy;Patient/family education    Plan Renewal next session - finish checking  progress towards LTG's (will be seen 1x/wk through end of March)    Consulted and Agree with Plan of Care Patient;Family member/caregiver    Family Member Consulted husband and daughter              Patient will benefit from skilled therapeutic intervention in order to improve the following deficits and impairments:   Body Structure / Function / Physical Skills: ADL, Strength, Dexterity, Balance, Tone, Body mechanics, Edema, Proprioception, UE functional use, IADL, ROM, Endurance, Mobility, Coordination, Sensation, FMC, Decreased knowledge of use of DME Cognitive Skills: Attention, Memory, Perception     Visit Diagnosis: Hemiplegia and hemiparesis following cerebral infarction affecting right dominant side (HCC)  Other lack of coordination  Unsteadiness on feet  Other symptoms and signs involving the nervous system  Other symptoms and signs involving cognitive functions following cerebral infarction  Visuospatial deficit    Problem List Patient Active Problem List   Diagnosis Date Noted   Nausea without vomiting    Spastic hemiparesis (HCC)    History of hypertension    Neurogenic orthostatic hypotension (HCC)    Transaminitis    Right hemiplegia ([place])    Hemorrhagic stroke ([place])    Leucocytosis    Essential hypertension    Dyslipidemia    Right hemiparesis ([place])    ICH (intracerebral hemorrhage) ([place]) 12/01/2020   HYPERLIPIDEMIA 07/06/2009   OBESITY 07/06/2009    Dontea Corlew, OT 05/16/2021, 11:59 AM  [facility] [address] [place], [place], [address] Phone: [phone]   Fax:  [phone]  Name: RANDYE TREICHLER MRN: [id number] Date of Birth: [birth year]   Vianne Bulls, OTR/L [facility] [address] [place], [place]  [address] [phone] phone [phone] 05/16/21 11:59 AM

## 2021-05-17 ENCOUNTER — Ambulatory Visit: Payer: 59 | Admitting: Physical Therapy

## 2021-05-17 DIAGNOSIS — R2681 Unsteadiness on feet: Secondary | ICD-10-CM

## 2021-05-17 DIAGNOSIS — M6281 Muscle weakness (generalized): Secondary | ICD-10-CM

## 2021-05-17 DIAGNOSIS — R29818 Other symptoms and signs involving the nervous system: Secondary | ICD-10-CM

## 2021-05-17 DIAGNOSIS — I69351 Hemiplegia and hemiparesis following cerebral infarction affecting right dominant side: Secondary | ICD-10-CM

## 2021-05-17 DIAGNOSIS — R2689 Other abnormalities of gait and mobility: Secondary | ICD-10-CM

## 2021-05-17 NOTE — Therapy (Signed)
[facility] [address] [place], [place], [address] Phone: [phone]   Fax:  [phone]  Physical Therapy Treatment  Patient Details  Name: Felicia Chen MRN: [id number] Date of Birth: Jan 06, 1966 Referring Provider (PT): Dr. Dagoberto Ligas   Encounter Date: 05/17/2021   PT End of Session - 05/17/21 1344     Visit Number 23    Number of Visits 30    Date for PT Re-Evaluation 06/10/21    Authorization Type UHC -40 visit limit per discipline?    PT Start Time 0845    PT Stop Time 0929    PT Time Calculation (min) 44 min    Equipment Utilized During Treatment Other (comment)   aqua dumbbells   Activity Tolerance Patient tolerated treatment well    Behavior During Therapy WFL for tasks assessed/performed             Past Medical History:  Diagnosis Date   Anxiety    Gilbert's syndrome     Past Surgical History:  Procedure Laterality Date   ABDOMINAL HYSTERECTOMY N/A 06/11/2012   Procedure: HYSTERECTOMY ABDOMINAL;  Surgeon: Allena Katz, MD;  Location: [facility];  Service: Gynecology;  Laterality: N/A;   CESAREAN SECTION     x 4   CYSTOSCOPY N/A 06/11/2012   Procedure: CYSTOSCOPY;  Surgeon: Allena Katz, MD;  Location: [facility];  Service: Gynecology;  Laterality: N/A;   HERNIA REPAIR     LAPAROSCOPIC ASSISTED VAGINAL HYSTERECTOMY N/A 06/11/2012   Procedure: LAPAROSCOPIC ASSISTED VAGINAL HYSTERECTOMY atttempted;  Surgeon: Allena Katz, MD;  Location: [facility];  Service: Gynecology;  Laterality: N/A;  Attempted Laparoscopic assisted vaginal hysterectomy.   LAPAROSCOPIC LYSIS OF ADHESIONS N/A 06/11/2012   Procedure: LAPAROSCOPIC LYSIS OF ADHESIONS;  Surgeon: Allena Katz, MD;  Location: [facility];  Service: Gynecology;  Laterality: N/A;   SALPINGOOPHORECTOMY Bilateral 06/11/2012   Procedure: SALPINGO OOPHORECTOMY;  Surgeon: Allena Katz, MD;  Location: [facility];  Service: Gynecology;  Laterality:  Bilateral;    There were no vitals filed for this visit.   Subjective Assessment - 05/17/21 1057     Subjective Contacted [facility] and they were able to get her right in.  Had a fall at home: Was taking a few steps in her living room and held on to the back of the recliner which moved.  No injury and was able to practice method for getting off the floor with UE support.    Patient is accompained by: Family member    Pertinent History anxiety, Gilbert's syndrome, obesity, HLD.    Limitations Walking;House hold activities;Standing    Patient Stated Goals wants to get back to normal.             Patient seen for aquatic therapy today.  Donned air cast on RLE with sneaker for ankle stability.  Treatment took place in water 3-4 feet deep depending upon activity.  Pt entered and exited the pool via stairs with rail.  When descending pt led with LLE, when ascending pt led with RLE with min A to clear R toes on step.  Gastroc/Soleus stretch: functional stretch while descending stairs to enter water: performed step downs leading with LLE, standing on RLE with cues to maintain R hip in neutral.  Standing with back to the wall and UE in "T", PT assisted with lifting/flexing RLE and crossing knee across midline while pt performed head, trunk and pelvis counter rotation to L  with 20 second hold x 3 reps.  In standing performed open and closed chain R hip IR: attempted to perform open chain lifting RLE and crossing midline to tap target on L side of step but pt unable to perform isolated R hip open chain IR/adduction and compensated with closed chain L hip rotation.  Changed to standing on RLE on step and performing forward step down and then across midline to R with LLE with PT maintaining RLE in neutral so that R hip performed internal rotation via body over LE movement.  Stepped down from step and performed closed chain R hip IR with cues to push toes into floor, lift heel and rotate heel out (IR) and  continue to perform this active IR with each side step (Squish the bug).    Gait holding aqua bells under the water while performing walking forwards and retro across width of pool with PT providing verbal cues to maintain shoulder depression, increased stance time on RLE and increased L step length; also provided cues to widen BOS when performing R swing phase.  When performing retro stepping, provided cues for upright posture during R stance phase due to R trunk lateral lean due to glute med weakness on RLE.  Changed to lateral side stepping while maintaining small squat to L and R with UE support on aqua bells x 3 laps with verbal and visual cues for lateral weight shifting and full step together.  Transitioned to grapevine across pool but standing on RLE and crossing over and then behind with LLE to facilitate R hip rotation in stance phase; performed 2 laps down and back.    Get up from pool floor: placed submerged step on pool floor and pt transitioned from standing > tall kneeling on step > half kneeling with L then R foot forwards stepping foot over/in front of step > stand with one UE support.  Required cues to fully weight shift to RLE when advancing LLE.  Pt requires buoyancy of water for support for joint offloading and body weight support for gait training, to reduce fall risk with gait training and balance exercises with minimal UE support; exercises able to be performed safely in water without the risk of fall compared to those same exercises performed on land;  viscosity of water needed for resistance for strengthening.  Current of water provides perturbations for challenging static & dynamic standing balance.     PT Short Term Goals - 05/10/21 1332       PT SHORT TERM GOAL #1   Title Pt will be independent with progressive HEP in order to build upon functional gains made in therapy (ALL STGs Due: 05/12/21)    Baseline family not present today to review HEP, but pt reports that  performing consistently at home, will benefit from family review in future session    Time 4    Period Weeks    Status New    Target Date 05/12/21      PT SHORT TERM GOAL #2   Title Patient will improve Berg Balance to >/= 43/56 to demo improved balance and reduced fall risk    Baseline 36/56 initial assessment; 41/56    Time 4    Period Weeks    Status Revised      PT SHORT TERM GOAL #3   Title Pt will improve gait speed to at least 1.85 ft/sec in order to demo improved household mobility.    Baseline .90 ft/sec with RW; 1.73 ft/sec  Time 4    Period Weeks    Status Revised      PT SHORT TERM GOAL #4   Title Pt will ambulate at least >/= 600 ft with RW and supervision in order to demo improved household mobility.    Baseline 230 w/ RW and CGA/MinA    Time 4    Period Weeks    Status Revised      PT SHORT TERM GOAL #5   Title Pt will initiate Aquatic PT services and initiate Aquatic HEP    Time 4    Period Weeks    Status Achieved               PT Long Term Goals - 04/14/21 1115       PT LONG TERM GOAL #1   Title Pt will be independent with land and aquatic HEP in with family supervision in order to build upon functional gains made in therapy. ALL LTGS DUE 06/10/21    Baseline independent with current HEP; will benefit from progressive HEP    Time 8    Period Weeks    Status Revised    Target Date 06/10/21      PT LONG TERM GOAL #2   Title Patient will improve Berg Balance to >/= 46/56 to demo reduced fall risk and imbalance    Baseline 41/56    Time 8    Period Weeks    Status Revised      PT LONG TERM GOAL #3   Title Pt will ambulate at least 500' outdoors with LRAD and supervision in order to demo improved community mobility.    Baseline 450' outdors with RW CGA/supervision    Time 8    Period Weeks    Status Revised      PT LONG TERM GOAL #4   Title Pt will go up and down 12 stairs with single handrail with supervision/min guard with step to  pattern in order to safely go up/down the stairs in her house.    Baseline not yet assessed; 12 stairs with CGA/supervision bil rails    Time 8    Period Weeks    Status On-going      PT LONG TERM GOAL #5   Title Pt will perform 5x sit <> stand with UE support in 12 seconds or less with supervision in order to demo decr fall risk.    Baseline 23.69 seconds with min guard; 14.44 secs with UE support    Time 8    Period Weeks    Status Revised      PT LONG TERM GOAL #6   Title Pt will improve gait speed to at least 2.0 ft/sec in order to demo improved household mobility and decr fall risk.    Baseline .90 ft/sec with RW; 1.73 ft/sec    Time 8    Period Weeks    Status Revised                   Plan - 05/17/21 1345     Clinical Impression Statement Continued to utilize aquatic environment to focus on RLE hip ROM into IR, IR strength, dynamic standing balance and dynamic gait training.  Also initiated floor > stand training with each LE with cues required for increased weight shift laterally and forwards over RLE.  Will continue to address and progress towards LTG.    Personal Factors and Comorbidities Comorbidity 3+;Past/Current Experience;Time since onset of injury/illness/exacerbation  Comorbidities anxiety, Gilbert's syndrome, obesity, HLD    Examination-Activity Limitations Bathing;Bend;Caring for Others;Toileting;Stand;Stairs;Squat;Reach Overhead;Locomotion Level;Transfers    Examination-Participation Restrictions Cleaning;Community Activity;Driving;Medication Management    Stability/Clinical Decision Making Evolving/Moderate complexity    Rehab Potential Good    PT Frequency 2x / week    PT Duration 8 weeks    PT Treatment/Interventions ADLs/Self Care Home Management;Electrical Stimulation;DME Instruction;Gait training;Stair training;Functional mobility training;Therapeutic activities;Aquatic Therapy;Therapeutic exercise;Balance training;Neuromuscular  re-education;Manual techniques;Orthotic Fit/Training;Patient/family education;Passive range of motion;Energy conservation;Vestibular;Visual/perceptual remediation/compensation    PT Next Visit Plan NEED TO CHECK STGS!! work on floor transfers again (but just trying to get directly back up instead of performing exercises first). RLE NMR and strengthening (esp R hip).  Gait training with RW and brace (any update with [facility]?). SciFit, standing balance.  Aquatic: tall>half kneeling on submerged step, R SLS, step ups, R hip IR strengthening.    PT Home Exercise Plan [id number]    Consulted and Agree with Plan of Care Patient;Family member/caregiver             Patient will benefit from skilled therapeutic intervention in order to improve the following deficits and impairments:  Abnormal gait, Decreased activity tolerance, Decreased balance, Decreased cognition, Decreased endurance, Decreased coordination, Decreased range of motion, Decreased safety awareness, Decreased strength, Hypomobility, Increased edema, Impaired sensation, Postural dysfunction, Impaired flexibility, Impaired tone, Difficulty walking, Impaired UE functional use, Impaired vision/preception  Visit Diagnosis: Hemiplegia and hemiparesis following cerebral infarction affecting right dominant side (HCC)  Unsteadiness on feet  Other symptoms and signs involving the nervous system  Muscle weakness (generalized)  Other abnormalities of gait and mobility     Problem List Patient Active Problem List   Diagnosis Date Noted   Nausea without vomiting    Spastic hemiparesis (HCC)    History of hypertension    Neurogenic orthostatic hypotension (HCC)    Transaminitis    Right hemiplegia ([facility])    Hemorrhagic stroke (HCC)    Leucocytosis    Essential hypertension    Dyslipidemia    Right hemiparesis (HCC)    ICH (intracerebral hemorrhage) ([facility]) 12/01/2020   HYPERLIPIDEMIA 07/06/2009   OBESITY 07/06/2009   Rico Junker,  PT, DPT 05/17/21    9:46 PM    [facility] [address] [place], [place], [address] Phone: [phone]   Fax:  [phone]  Name: SYNDA BAGENT MRN: [id number] Date of Birth: Aug 05, 1965

## 2021-05-18 ENCOUNTER — Ambulatory Visit: Payer: 59 | Admitting: Physical Therapy

## 2021-05-19 ENCOUNTER — Other Ambulatory Visit: Payer: Self-pay

## 2021-05-19 ENCOUNTER — Ambulatory Visit: Payer: 59 | Attending: Family Medicine | Admitting: Physical Therapy

## 2021-05-19 ENCOUNTER — Ambulatory Visit: Payer: 59 | Admitting: Occupational Therapy

## 2021-05-19 DIAGNOSIS — M6281 Muscle weakness (generalized): Secondary | ICD-10-CM | POA: Insufficient documentation

## 2021-05-19 DIAGNOSIS — R2681 Unsteadiness on feet: Secondary | ICD-10-CM | POA: Insufficient documentation

## 2021-05-19 DIAGNOSIS — R41842 Visuospatial deficit: Secondary | ICD-10-CM

## 2021-05-19 DIAGNOSIS — R278 Other lack of coordination: Secondary | ICD-10-CM | POA: Diagnosis present

## 2021-05-19 DIAGNOSIS — I69351 Hemiplegia and hemiparesis following cerebral infarction affecting right dominant side: Secondary | ICD-10-CM | POA: Insufficient documentation

## 2021-05-19 DIAGNOSIS — R29818 Other symptoms and signs involving the nervous system: Secondary | ICD-10-CM | POA: Insufficient documentation

## 2021-05-19 DIAGNOSIS — R2689 Other abnormalities of gait and mobility: Secondary | ICD-10-CM | POA: Diagnosis present

## 2021-05-19 NOTE — Therapy (Signed)
[facility] [address] [place], [place], [address] Phone: [phone]   Fax:  [phone]  Occupational Therapy Treatment  Patient Details  Name: Felicia Chen MRN: [id number] Date of Birth: 1965/12/20 Referring Provider (OT): Dr. Genice Rouge   Encounter Date: 05/19/2021   OT End of Session - 05/19/21 1000     Visit Number 23    Number of Visits 29    Date for OT Re-Evaluation 07/02/21    Authorization Type UHC: VL 40 per discipline, no auth required    OT Start Time 0933    OT Stop Time 1015    OT Time Calculation (min) 42 min    Activity Tolerance Patient tolerated treatment well    Behavior During Therapy [facility] for tasks assessed/performed             Past Medical History:  Diagnosis Date   Anxiety    Gilbert's syndrome     Past Surgical History:  Procedure Laterality Date   ABDOMINAL HYSTERECTOMY N/A 06/11/2012   Procedure: HYSTERECTOMY ABDOMINAL;  Surgeon: [name], MD;  Location: [facility];  Service: Gynecology;  Laterality: N/A;   CESAREAN SECTION     x 4   CYSTOSCOPY N/A 06/11/2012   Procedure: CYSTOSCOPY;  Surgeon: [name], MD;  Location: [facility];  Service: Gynecology;  Laterality: N/A;   HERNIA REPAIR     LAPAROSCOPIC ASSISTED VAGINAL HYSTERECTOMY N/A 06/11/2012   Procedure: LAPAROSCOPIC ASSISTED VAGINAL HYSTERECTOMY atttempted;  Surgeon: [name], MD;  Location: [facility];  Service: Gynecology;  Laterality: N/A;  Attempted Laparoscopic assisted vaginal hysterectomy.   LAPAROSCOPIC LYSIS OF ADHESIONS N/A 06/11/2012   Procedure: LAPAROSCOPIC LYSIS OF ADHESIONS;  Surgeon: [name], MD;  Location: [facility];  Service: Gynecology;  Laterality: N/A;   SALPINGOOPHORECTOMY Bilateral 06/11/2012   Procedure: SALPINGO OOPHORECTOMY;  Surgeon: [name], MD;  Location: [facility];  Service: Gynecology;  Laterality: Bilateral;    There were no vitals filed for this  visit.   Subjective Assessment - 05/19/21 0932     Subjective  "I have constant stiffness in my hand that I can work out and there is weakness"    Patient is accompanied by: Family member    Pertinent History ICH 12/01/20 Lt frontal/parietal lobes w/ SAH extending over bilat cerebral hemispheres and cerebellum, Lt temporal lobe ischemic CVA w/ residual Rt hemiplegia. PMH: HLD, anxiety, covid-19, Gilbert's syndrome    Limitations fall risk    Patient Stated Goals get back to normal    Currently in Pain? No/denies             Reviewed current ongoing goals and discussed new goals for renewal period.   UBE  x 8 min. Level 3 for normal reciprocal movement pattern and UE endurance.   Pt placing key shaped pegs in pegboard Rt hand then removing with tweezers. Environmental scanning in busy gym finding 11/12 items on first pass with min cues overall. Pt found remaining item on 2nd pass w/o cues.  Reaching RUE requiring 125* sh flexion to retrieve/replace non weighted objects on high shelf.                        OT Short Term Goals - 05/19/21 1008       OT SHORT TERM GOAL #1   Title Independent with HEP for RUE shoulder ROM, hand coordination and hand strength  Time 4    Period Weeks    Status Achieved    Target Date 03/16/21      OT SHORT TERM GOAL #2   Title Pt to perform all BADLS with no more than min assist    Time 4    Period Weeks    Status Achieved      OT SHORT TERM GOAL #3   Title Pt to write name and address with 50% or greater legibility    Time 4    Period Weeks    Status Achieved   90% legibility w/ extra time     OT SHORT TERM GOAL #4   Title Pt to prepare simple snack, sandwich, and microwaveable items consistently using DME prn with distant supervision only    Time 4    Period Weeks    Status Achieved   pt req'd close supervision d/t unsteadiness with standing at counter for reaching items and managing refrigerator.  05/16/21  Met at  this level     OT SHORT TERM GOAL #5   Title Pt to improve coordination as evidenced by performing 9 hole peg test Rt hand in under 1 min. and 15 sec    Baseline 1 min. 45 sec    Time 4    Period Weeks    Status Achieved   52.87 sec     Additional Short Term Goals   Additional Short Term Goals Yes      OT SHORT TERM GOAL #6   Title Pt to grip strength Rt hand to 30 lbs or greater in prep for opening jars    Baseline 22 lbs (Lt = 65 lbs)    Time 4    Period Weeks    Status Achieved   36 lbs     OT SHORT TERM GOAL #7   Title Improve coordination Rt hand as evidenced by performing 9 hole peg test in 30 sec or less consistently    Baseline at eval: 1 min, 45 sec, at reassessment: 36.56    Time 3    Period Weeks    Status New      OT SHORT TERM GOAL #8   Title Pt to perform high level reaching RUE at 125* sh flexion to retrieve/replace up to 3 lb object from higher cabinet    Baseline at renewal: 125* w/ no weight    Time 3    Period Weeks    Status New               OT Long Term Goals - 05/19/21 1010       OT LONG TERM GOAL #1   Title Independent with strengthening HEP for Rt shoulder    Time 12    Period Weeks    Status On-going   will add to HEP closer to d/c     OT LONG TERM GOAL #2   Title Pt to fully attend to Rt side and return to using Rt hand as dominant hand 90% of the time safely for ADLS w/ task modifications prn    Time 12    Period Weeks    Status On-going      OT LONG TERM GOAL #3   Title Independent with all BADLS    Time 12    Period Weeks    Status On-going   assist w/ shower transfer and hooking bra     OT LONG TERM GOAL #4   Title Pt to perform simple  cooking tasks with supervision/min cueing only    Time 12    Period Weeks    Status New      OT LONG TERM GOAL #5   Title Improve coordination Rt hand as evidenced by performing 9 hole peg test in 45 sec or less    Baseline 1 min. 45 sec.    Time 12    Period Weeks    Status Achieved    36.56 sec     OT LONG TERM GOAL #6   Title Pt to improve grip strength Rt hand to 40 lbs or greater to open jars/containers    Baseline 22 lbs (Lt = 65 lbs)    Time 12    Period Weeks    Status Achieved   42 LBS     OT LONG TERM GOAL #7   Title Pt to write name at 90% legibility and 3 sentences at 75% or greater legibility    Time 12    Period Weeks    Status Achieved      OT LONG TERM GOAL #8   Title Pt to consistently perform environmental scanning in busy environment at 90% accuracy    Baseline at renewal: pt found 11/12 items with min cues    Period Weeks    Status New                   Plan - 05/19/21 1012     Clinical Impression Statement Renewal completed today: Pt to work towards new STG's and ongoing and new LTG's for renewal period.    OT Occupational Profile and History Detailed Assessment- Review of Records and additional review of physical, cognitive, psychosocial history related to current functional performance    Occupational performance deficits (Please refer to evaluation for details): ADL's;IADL's;Work;Leisure    Body Structure / Function / Physical Skills ADL;Strength;Dexterity;Balance;Tone;Body mechanics;Edema;Proprioception;UE functional use;IADL;ROM;Endurance;Mobility;Coordination;Sensation;FMC;Decreased knowledge of use of DME    Cognitive Skills Attention;Memory;Perception    Rehab Potential Good    Clinical Decision Making Several treatment options, min-mod task modification necessary    Comorbidities Affecting Occupational Performance: May have comorbidities impacting occupational performance    Modification or Assistance to Complete Evaluation  No modification of tasks or assist necessary to complete eval    OT Frequency 1x / week    OT Duration 6 weeks   for renewal period (anticipate only 4 weeks)   OT Treatment/Interventions Self-care/ADL training;DME and/or AE instruction;Moist Heat;Therapeutic activities;Compression  bandaging;Splinting;Therapeutic exercise;Ultrasound;Cognitive remediation/compensation;Coping strategies training;Visual/perceptual remediation/compensation;Passive range of motion;Neuromuscular education;Electrical Stimulation;Cryotherapy;Manual Therapy;Patient/family education    Plan practice simple cooking task (egg or grilled cheese) w/ close supervision and cueing prn    Consulted and Agree with Plan of Care Patient;Family member/caregiver    Family Member Consulted husband and daughter             Patient will benefit from skilled therapeutic intervention in order to improve the following deficits and impairments:   Body Structure / Function / Physical Skills: ADL, Strength, Dexterity, Balance, Tone, Body mechanics, Edema, Proprioception, UE functional use, IADL, ROM, Endurance, Mobility, Coordination, Sensation, FMC, Decreased knowledge of use of DME Cognitive Skills: Attention, Memory, Perception     Visit Diagnosis: Hemiplegia and hemiparesis following cerebral infarction affecting right dominant side (HCC)  Unsteadiness on feet  Other lack of coordination  Visuospatial deficit  Muscle weakness (generalized)    Problem List Patient Active Problem List   Diagnosis Date Noted   Nausea without vomiting    Spastic hemiparesis ([place])  History of hypertension    Neurogenic orthostatic hypotension (HCC)    Transaminitis    Right hemiplegia (HCC)    Hemorrhagic stroke (HCC)    Leucocytosis    Essential hypertension    Dyslipidemia    Right hemiparesis (HCC)    ICH (intracerebral hemorrhage) ([place]) 12/01/2020   HYPERLIPIDEMIA 07/06/2009   OBESITY 07/06/2009    Carey Bullocks, OTR/L 05/19/2021, 11:14 AM  [facility] [address] [place], [place], [address] Phone: [phone]   Fax:  [phone]  Name: Felicia Chen MRN: [id number] Date of Birth: 09/05/1965

## 2021-05-19 NOTE — Therapy (Addendum)
OUTPATIENT PHYSICAL THERAPY TREATMENT NOTE   Patient Name: Felicia Chen MRN: [id number] DOB:1965-09-19, 56 y.o., female Today's Date: 05/19/2021  PCP: Aretta Nip, MD REFERRING PROVIDER: Aretta Nip, MD   PT End of Session - 05/19/21 [phone]     Visit Number 24    Number of Visits 30    Date for PT Re-Evaluation 06/10/21    Authorization Type [employer] -40 visit limit per discipline hard max (restarted in Jan 2023)    Authorization - Visit Number 18    Authorization - Number of Visits 40    PT Start Time [id number]    PT Stop Time 0930    PT Time Calculation (min) 43 min    Equipment Utilized During Treatment Gait belt    Activity Tolerance Patient tolerated treatment well    Behavior During Therapy WFL for tasks assessed/performed             Past Medical History:  Diagnosis Date   Anxiety    Gilbert's syndrome    Past Surgical History:  Procedure Laterality Date   ABDOMINAL HYSTERECTOMY N/A 06/11/2012   Procedure: HYSTERECTOMY ABDOMINAL;  Surgeon: Allena Katz, MD;  Location: [facility];  Service: Gynecology;  Laterality: N/A;   CESAREAN SECTION     x 4   CYSTOSCOPY N/A 06/11/2012   Procedure: CYSTOSCOPY;  Surgeon: Allena Katz, MD;  Location: [facility];  Service: Gynecology;  Laterality: N/A;   HERNIA REPAIR     LAPAROSCOPIC ASSISTED VAGINAL HYSTERECTOMY N/A 06/11/2012   Procedure: LAPAROSCOPIC ASSISTED VAGINAL HYSTERECTOMY atttempted;  Surgeon: Allena Katz, MD;  Location: [facility];  Service: Gynecology;  Laterality: N/A;  Attempted Laparoscopic assisted vaginal hysterectomy.   LAPAROSCOPIC LYSIS OF ADHESIONS N/A 06/11/2012   Procedure: LAPAROSCOPIC LYSIS OF ADHESIONS;  Surgeon: Allena Katz, MD;  Location: [facility];  Service: Gynecology;  Laterality: N/A;   SALPINGOOPHORECTOMY Bilateral 06/11/2012   Procedure: SALPINGO OOPHORECTOMY;  Surgeon: Allena Katz, MD;  Location: [facility];  Service: Gynecology;  Laterality: Bilateral;   Patient  Active Problem List   Diagnosis Date Noted   Nausea without vomiting    Spastic hemiparesis (HCC)    History of hypertension    Neurogenic orthostatic hypotension (HCC)    Transaminitis    Right hemiplegia (HCC)    Hemorrhagic stroke (HCC)    Leucocytosis    Essential hypertension    Dyslipidemia    Right hemiparesis (HCC)    ICH (intracerebral hemorrhage) ([place]) 12/01/2020   HYPERLIPIDEMIA 07/06/2009   OBESITY 07/06/2009    REFERRING DIAG: Acute ischemic left MCA stroke   THERAPY DIAG:  Hemiplegia and hemiparesis following cerebral infarction affecting right dominant side (HCC)  Unsteadiness on feet  Muscle weakness (generalized)  PERTINENT HISTORY:  anxiety, Gilbert's syndrome, obesity, HLD.   PRECAUTIONS: Fall   SUBJECTIVE: Got her new AFO. Feels like her ankle turns more out with her brace than compared with no AFO. But overall it is way more comfortable than the other brace  PAIN:  Are you having pain? No     TODAY'S TREATMENT:    [facility] Adult PT Treatment/Exercise - 05/19/21 0902       Ambulation/Gait   Ambulation/Gait Yes    Ambulation/Gait Assistance 5: Supervision    Ambulation/Gait Assistance Details Pt wearing R PLS [place] Walk On AFO with leather toe cap.    Assistive device Rolling walker    Gait Pattern Step-through pattern;Decreased stance time - right;Decreased step length -  left;Decreased hip/knee flexion - right;Decreased dorsiflexion - right;Decreased weight shift to right;Poor foot clearance - right;Right foot flat;Wide base of support;Abducted- right    Ambulation Surface Level;Indoor    Gait velocity 19.78 seconds = 1.66 ft/sec      Berg Balance Test   Sit to Stand Able to stand without using hands and stabilize independently    Standing Unsupported Able to stand safely 2 minutes    Sitting with Back Unsupported but Feet Supported on Floor or Stool Able to sit safely and securely 2 minutes    Stand to Sit Sits safely with minimal use of  hands    Transfers Able to transfer safely, definite need of hands    Standing Unsupported with Eyes Closed Able to stand 10 seconds safely    Standing Ubsupported with Feet Together Able to place feet together independently and stand 1 minute safely    From Standing, Reach Forward with Outstretched Arm Can reach confidently >25 cm (10")    From Standing Position, Pick up Object from Floor Able to pick up shoe safely and easily    From Standing Position, Turn to Look Behind Over each Shoulder Looks behind one side only/other side shows less weight shift    Turn 360 Degrees Needs close supervision or verbal cueing    Standing Unsupported, Alternately Place Feet on Step/Stool Able to complete 4 steps without aid or supervision    Standing Unsupported, One Foot in Front Able to plae foot ahead of the other independently and hold 30 seconds    Standing on One Leg Tries to lift leg/unable to hold 3 seconds but remains standing independently    Total Score 45              Answered pt's questions about the following:  Sit to stands: pt asking about where her hand placement should go when performing at home whether she should cross them across her chest or place on thighs, discussed that it is pt preference and that goal is to work on proper positioning to work BLE instead of using BUE to push from mat. Performed x5 reps.   Pt asking if she should work on just holding the RW with just her L hand, discussed that this is not safe and she needs to be pushing the RW with BUE at all times, plus gets additional weight bearing through [place].   Pt wanting to know how she can do hip IR stretch at home that she has done in the pool, had pt lay supine with arms extended, LLE extended and then R knee in flexion and draping it across the body, dicussed making sure pt feels a stretch that is comfortable and holding it for 30-45 seconds. Performed x3 reps with pt reporting relief. Pt did not need photo for home.    Verbally reviewed hip ABD hooklying exercise from HEP with pt asking if her RLE should be shaking when bringing LLE out into ABD and back to midline, discussed that her RLE is helping to stabilize and is also working during that exercise. Pt appreciate of information.   NMR: Pt standing with RLE with neutral foot positioning (needing assist in standing to help with proper position) and working on closed chain R hip IR, with LLE tapping to floor dots in front of RLE and then across midline x12 reps, able to progress with no UE support to floor dot in front of RLE but did need UE support when crossing midline, min guard. Performed  additional x10 reps with 2 gumdrops (in same position as dots) with pt performing a gentle toe tap, min guard as needed for balance.     PATIENT EDUCATION: Education details: Scheduling additional appts and reviewed questions pt had bout HEP.  Person educated: Patient and Child(ren) Education method: Explanation Education comprehension: verbalized understanding   HOME EXERCISE PROGRAM: [id number]   PT Short Term Goals - 05/19/21 0914       PT SHORT TERM GOAL #1   Title Pt will be independent with progressive HEP in order to build upon functional gains made in therapy (ALL STGs Due: 05/12/21)    Baseline pt reports compliance with HEP at home.    Time 4    Period Weeks    Status Achieved    Target Date 05/12/21      PT SHORT TERM GOAL #2   Title Patient will improve Berg Balance to >/= 43/56 to demo improved balance and reduced fall risk    Baseline 36/56 initial assessment; 41/56; 45/56 on 05/19/21    Time 4    Period Weeks    Status Achieved      PT SHORT TERM GOAL #3   Title Pt will improve gait speed to at least 1.85 ft/sec in order to demo improved household mobility.    Baseline .90 ft/sec with RW; 1.73 ft/sec; 1.66 ft/sec on 05/19/21    Time 4    Period Weeks    Status Not Met      PT SHORT TERM GOAL #4   Title Pt will ambulate at least >/= 600  ft with RW and supervision in order to demo improved household mobility.    Baseline not formally assessed during session, however pt able to ambulate with supervision.    Time 4    Period Weeks    Status Deferred      PT SHORT TERM GOAL #5   Title Pt will initiate Aquatic PT services and initiate Aquatic HEP    Time 4    Period Weeks    Status Achieved              PT Long Term Goals - 05/19/21 0914       PT LONG TERM GOAL #1   Title Pt will be independent with land and aquatic HEP in with family supervision in order to build upon functional gains made in therapy. ALL LTGS DUE 06/10/21    Baseline independent with current HEP; will benefit from progressive HEP    Time 8    Period Weeks    Status Revised    Target Date 06/10/21      PT LONG TERM GOAL #2   Title Patient will improve Berg Balance to >/= 47/56 to demo reduced fall risk and imbalance    Baseline 41/56, 45/56 on 05/19/21    Time 8    Period Weeks    Status Revised      PT LONG TERM GOAL #3   Title Pt will ambulate at least 500' outdoors with LRAD and supervision in order to demo improved community mobility.    Baseline 450' outdors with RW CGA/supervision    Time 8    Period Weeks    Status Revised      PT LONG TERM GOAL #4   Title Pt will go up and down 12 stairs with single handrail with supervision/min guard with step to pattern in order to safely go up/down the stairs in her house.  Baseline not yet assessed; 12 stairs with CGA/supervision bil rails    Time 8    Period Weeks    Status On-going      PT LONG TERM GOAL #5   Title Pt will perform 5x sit <> stand with UE support in 12 seconds or less with supervision in order to demo decr fall risk.    Baseline 23.69 seconds with min guard; 14.44 secs with UE support    Time 8    Period Weeks    Status Revised      PT LONG TERM GOAL #6   Title Pt will improve gait speed to at least 1.8 ft/sec in order to demo improved household mobility and decr  fall risk.    Baseline .90 ft/sec with RW; 1.73 ft/sec, 1.66 ft/sec on 05/19/21    Time 8    Period Weeks    Status Revised              Plan - 05/19/21 1232     Clinical Impression Statement Assessed pt's STGs today (have been unable to check them recently due to pt's aquatics session). Pt improved BERG to 45/56 and has met STG #2 (previously 41/56). Pt's did not meet STG #3 in regards to gait speed - performed in 1.66 ft/sec with RW (previously was 1.73 ft/sec). Pt received her new R PLS [place] AFO and reports that it is feeling much more comfortable. Added supine hip IR stretch to pt's HEP that she liked from previous aquatic therapy session. Worked on closed chain hip IR stretching/SLS balance on RLE with crossing LLE across midline. Will continue to progress towards LTGs.    Personal Factors and Comorbidities Comorbidity 3+;Past/Current Experience;Time since onset of injury/illness/exacerbation    Comorbidities anxiety, Gilbert's syndrome, obesity, HLD    Examination-Activity Limitations Bathing;Bend;Caring for Others;Toileting;Stand;Stairs;Squat;Reach Overhead;Locomotion Level;Transfers    Examination-Participation Restrictions Cleaning;Community Activity;Driving;Medication Management    Stability/Clinical Decision Making Evolving/Moderate complexity    Rehab Potential Good    PT Frequency 2x / week    PT Duration 8 weeks    PT Treatment/Interventions ADLs/Self Care Home Management;Electrical Stimulation;DME Instruction;Gait training;Stair training;Functional mobility training;Therapeutic activities;Aquatic Therapy;Therapeutic exercise;Balance training;Neuromuscular re-education;Manual techniques;Orthotic Fit/Training;Patient/family education;Passive range of motion;Energy conservation;Vestibular;Visual/perceptual remediation/compensation    PT Next Visit Plan let pt know about 40 visit hard limit and see how she would like to proceed. Land therapy: closed chain hip IR strengthening and  additional R hip strengthening, SLS tasks on RLE, tall/half kneeling, pt's balance has really improved might want to trial cane!  Aquatic: tall>half kneeling on submerged step, R SLS, step ups, R hip IR strengthening.    PT Home Exercise Plan [id number]    Consulted and Agree with Plan of Care Patient;Family member/caregiver               Arliss Journey, PT, DPT  05/19/2021, 12:46 PM

## 2021-05-23 ENCOUNTER — Other Ambulatory Visit: Payer: 59

## 2021-05-23 ENCOUNTER — Ambulatory Visit: Payer: 59

## 2021-05-23 ENCOUNTER — Ambulatory Visit
Admission: RE | Admit: 2021-05-23 | Discharge: 2021-05-23 | Disposition: A | Discharge status: Home / Self Care | Payer: 59 | Source: Ambulatory Visit | Attending: Adult Health | Admitting: Adult Health

## 2021-05-23 IMAGING — CT CT ANGIO HEAD-NECK (W OR W/O PERF)
1 series · 12 of 14 positions shown · non-contrast
Comparison: CT angiogram head and neck December 01, 2020.

CLINICAL DATA: Hemorrhagic stroke (HCC) FZ6.T ([facility]).
Stroke/TIA, assess extracranial arteries; recent NIHAA of unknown
cause - prior imaging ?RCVS vs stenosis. Ensure no underlying
vascular abnormalities not previously seen. Reversible
cerebrovascular vasoconstriction syndrome [id number].[id number] ([facility])

EXAM:
CT ANGIOGRAPHY HEAD AND NECK
TECHNIQUE: Multidetector CT imaging of the head and neck was performed using
the standard protocol during bolus administration of intravenous
contrast. Multiplanar CT image reconstructions and MIPs were
obtained to evaluate the vascular anatomy. Carotid stenosis
measurements (when applicable) are obtained utilizing NASCET
criteria, using the distal internal carotid diameter as the
denominator.

[Series 15: cta head & neck 1.00 hv48 s3 ax thin mips · axial · 0.52mm/px · z∈[-858,-532]mm · 12 of 386 slices shown]
[im 30/386  soft-tissue]
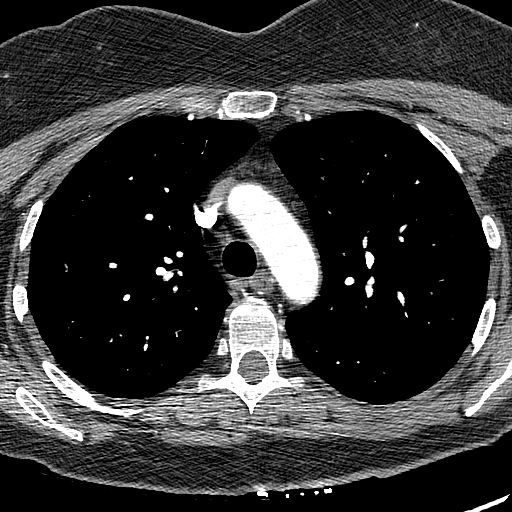
[im 60/386  bone]
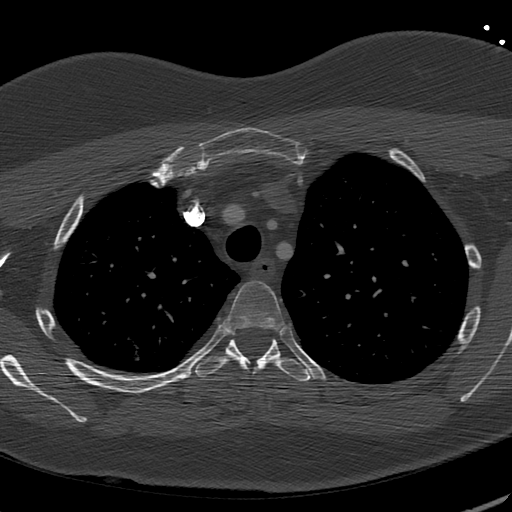
[im 89/386  soft-tissue]
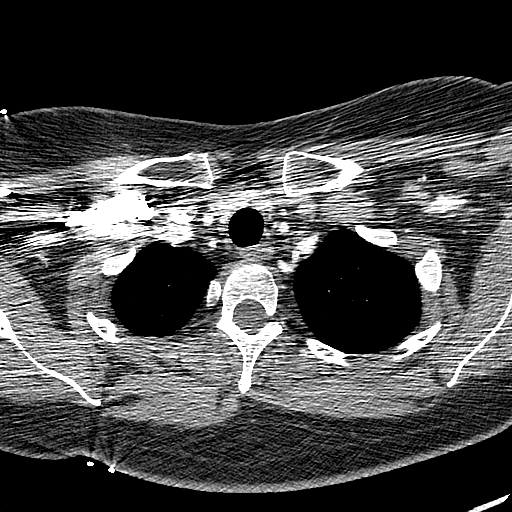
[im 119/386  bone]
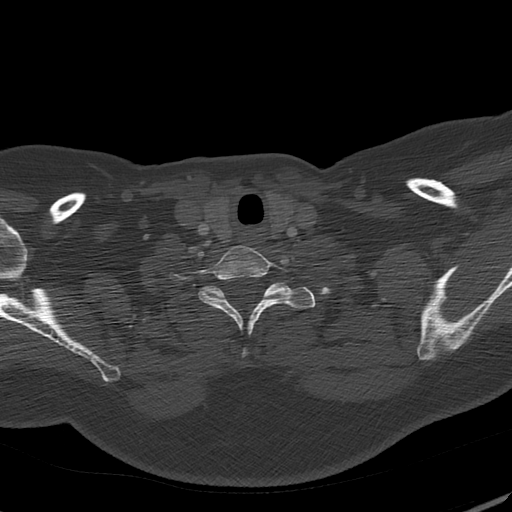
[im 149/386  soft-tissue]
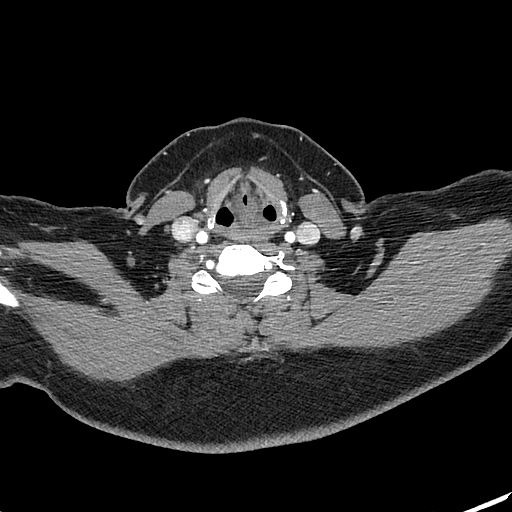
[im 178/386  bone]
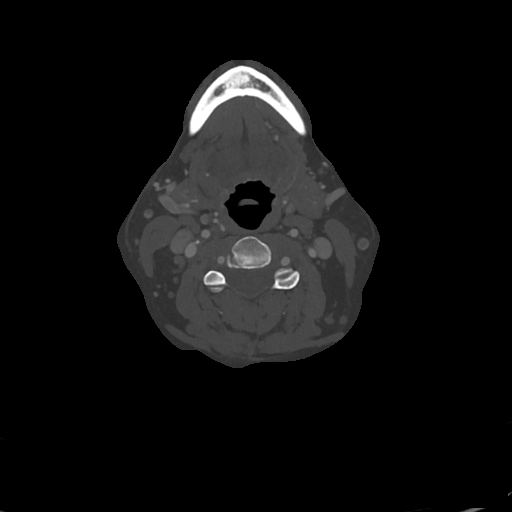
[im 208/386  soft-tissue]
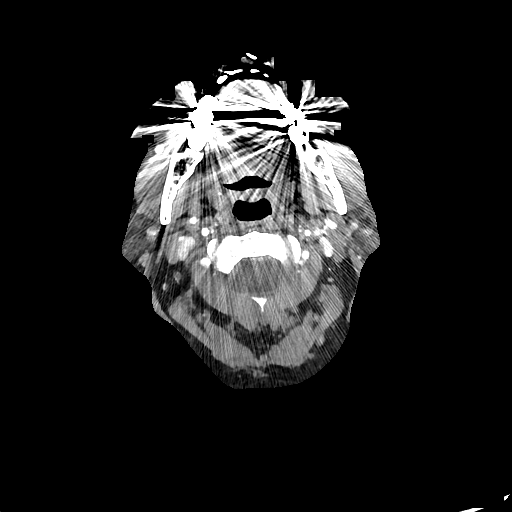
[im 237/386  bone]
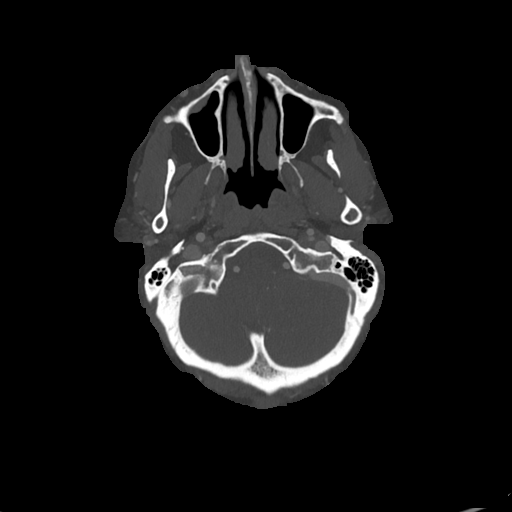
[im 267/386  soft-tissue]
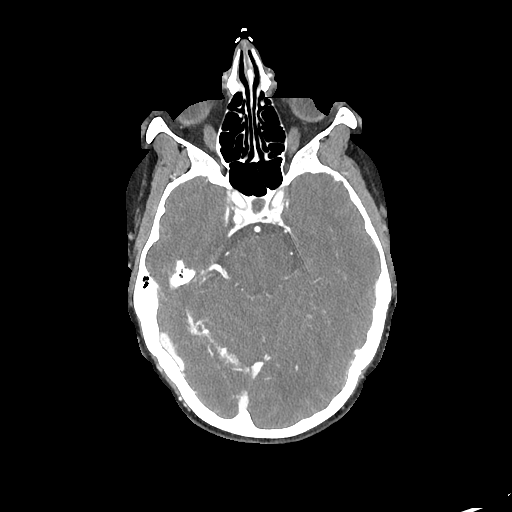
[im 297/386  bone]
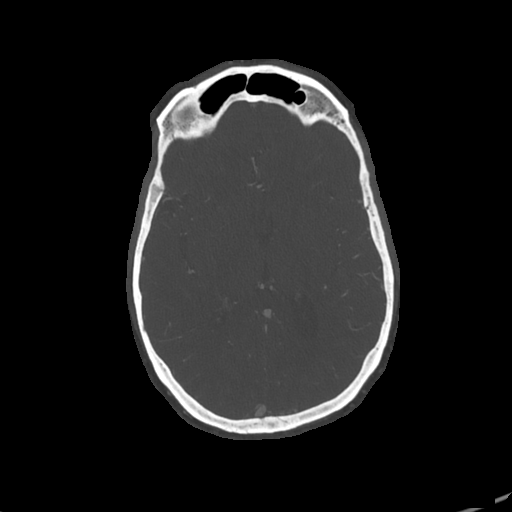
[im 326/386  soft-tissue]
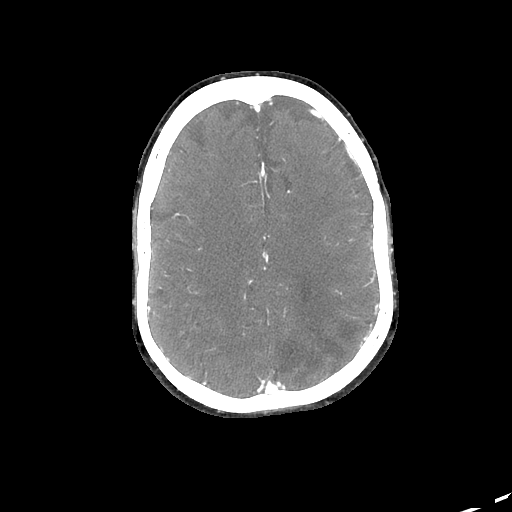
[im 356/386  bone]
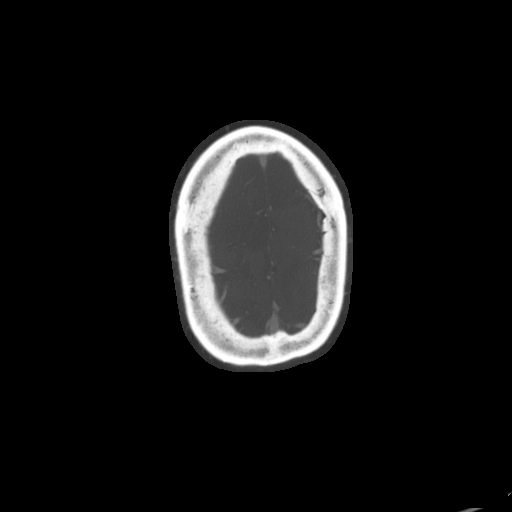

[12 of 14 positions shown; findings below may reference images not displayed]

RADIATION DOSE REDUCTION: This exam was performed according to the
departmental dose-optimization program which includes automated
exposure control, adjustment of the mA and/or kV according to
patient size and/or use of iterative reconstruction technique.

CONTRAST:  75mL [id number] IOPAMIDOL ([id number]) INJECTION 76%
FINDINGS: CT HEAD FINDINGS

Brain: No evidence of acute infarction, acute hemorrhage,
hydrocephalus, extra-axial collection or mass lesion/mass effect.
Area of encephalomalacia and gliosis in the left frontoparietal
region, sequela of known prior intracranial hemorrhage. Hypodensity
along the left cortical spinal tract, related to wallerian
degeneration.

Vascular: No hyperdense vessel or unexpected calcification.

Skull: Normal. Negative for fracture or focal lesion.

Sinuses: Mucous retention cyst in the right maxillary sinus.

Orbits: No acute finding.

Review of the MIP images confirms the above findings

CTA NECK FINDINGS

Aortic arch: Standard branching. Imaged portion shows no evidence of
aneurysm or dissection. No significant stenosis of the major arch
vessel origins.

Right carotid system: Mild atherosclerotic changes of the right
carotid bifurcation. Increased tortuosity of the cervical segment of
the right ICA. No significant stenosis.

Left carotid system: Mild atherosclerotic changes of the left
carotid bifurcation. Increased tortuosity of the cervical segment of
the left ICA. No significant stenosis.

Vertebral arteries: Codominant. No evidence of dissection, stenosis
(50% or greater) or occlusion.

Skeleton: Negative.

Other neck: Heterogeneous appearance of the thyroid gland.

Upper chest: Negative.

Review of the MIP images confirms the above findings

CTA HEAD FINDINGS

Anterior circulation: No significant stenosis, proximal occlusion,
aneurysm, or vascular malformation.

Posterior circulation: No significant stenosis, proximal occlusion,
aneurysm, or vascular malformation.

Venous sinuses: As permitted by contrast timing, patent.

Anatomic variants: Hypoplastic left A1/ACA segment. Right PCA/P1
segment and right posterior communicating artery are codominant.

Review of the MIP images confirms the above findings
IMPRESSION: 1. No acute intracranial abnormality.
2. Area of encephalomalacia and gliosis in the left frontoparietal
lobe related to prior intraparenchymal hematoma.
3. No evidence of an aneurysm, AVM or dural AV fistula.
4. Interval resolution of scattered diffuse intracranial vasospasm.

## 2021-05-23 MED ORDER — IOPAMIDOL (ISOVUE-370) INJECTION 76%
75.0000 mL | Freq: Once | INTRAVENOUS | Status: AC | PRN
Start: 2021-05-23 — End: 2021-05-23
  Administered 2021-05-23: 75 mL via INTRAVENOUS

## 2021-05-24 ENCOUNTER — Ambulatory Visit: Payer: 59 | Admitting: Physical Therapy

## 2021-05-24 ENCOUNTER — Other Ambulatory Visit: Payer: Self-pay

## 2021-05-24 DIAGNOSIS — R2689 Other abnormalities of gait and mobility: Secondary | ICD-10-CM

## 2021-05-24 DIAGNOSIS — M6281 Muscle weakness (generalized): Secondary | ICD-10-CM

## 2021-05-24 DIAGNOSIS — I69351 Hemiplegia and hemiparesis following cerebral infarction affecting right dominant side: Secondary | ICD-10-CM | POA: Diagnosis not present

## 2021-05-24 DIAGNOSIS — R2681 Unsteadiness on feet: Secondary | ICD-10-CM

## 2021-05-24 DIAGNOSIS — R29818 Other symptoms and signs involving the nervous system: Secondary | ICD-10-CM

## 2021-05-24 NOTE — Therapy (Signed)
OUTPATIENT PHYSICAL THERAPY TREATMENT NOTE   Patient Name: Felicia Chen MRN: [id number] DOB:23-Jun-1965, 56 y.o., female Today's Date: 05/24/2021  PCP: Aretta Nip, MD REFERRING PROVIDER: Aretta Nip, MD   PT End of Session - 05/24/21 1322     Visit Number 25    Number of Visits 30    Date for PT Re-Evaluation 06/10/21    Authorization Type UHC -40 visit limit per discipline hard max (restarted in Jan 2023)    Authorization - Visit Number 19    Authorization - Number of Visits 40    PT Start Time 0843    PT Stop Time 0930    PT Time Calculation (min) 47 min    Equipment Utilized During Treatment --   aqua dumbbells   Activity Tolerance Patient tolerated treatment well    Behavior During Therapy WFL for tasks assessed/performed              Past Medical History:  Diagnosis Date   Anxiety    Gilbert's syndrome    Past Surgical History:  Procedure Laterality Date   ABDOMINAL HYSTERECTOMY N/A 06/11/2012   Procedure: HYSTERECTOMY ABDOMINAL;  Surgeon: Allena Katz, MD;  Location: [facility];  Service: Gynecology;  Laterality: N/A;   CESAREAN SECTION     x 4   CYSTOSCOPY N/A 06/11/2012   Procedure: CYSTOSCOPY;  Surgeon: Allena Katz, MD;  Location: [facility];  Service: Gynecology;  Laterality: N/A;   HERNIA REPAIR     LAPAROSCOPIC ASSISTED VAGINAL HYSTERECTOMY N/A 06/11/2012   Procedure: LAPAROSCOPIC ASSISTED VAGINAL HYSTERECTOMY atttempted;  Surgeon: Allena Katz, MD;  Location: [facility];  Service: Gynecology;  Laterality: N/A;  Attempted Laparoscopic assisted vaginal hysterectomy.   LAPAROSCOPIC LYSIS OF ADHESIONS N/A 06/11/2012   Procedure: LAPAROSCOPIC LYSIS OF ADHESIONS;  Surgeon: Allena Katz, MD;  Location: [facility];  Service: Gynecology;  Laterality: N/A;   SALPINGOOPHORECTOMY Bilateral 06/11/2012   Procedure: SALPINGO OOPHORECTOMY;  Surgeon: Allena Katz, MD;  Location: [facility];  Service: Gynecology;  Laterality: Bilateral;    Patient Active Problem List   Diagnosis Date Noted   Nausea without vomiting    Spastic hemiparesis (HCC)    History of hypertension    Neurogenic orthostatic hypotension (HCC)    Transaminitis    Right hemiplegia (HCC)    Hemorrhagic stroke (HCC)    Leucocytosis    Essential hypertension    Dyslipidemia    Right hemiparesis (HCC)    ICH (intracerebral hemorrhage) ([place]) 12/01/2020   HYPERLIPIDEMIA 07/06/2009   OBESITY 07/06/2009    REFERRING DIAG: Acute ischemic left MCA stroke   THERAPY DIAG:  Hemiplegia and hemiparesis following cerebral infarction affecting right dominant side (HCC)  Unsteadiness on feet  Muscle weakness (generalized)  Other symptoms and signs involving the nervous system  Other abnormalities of gait and mobility  PERTINENT HISTORY:  anxiety, Gilbert's syndrome, obesity, HLD.   PRECAUTIONS: Fall   SUBJECTIVE: Thinks she may have been wearing her AFO too much; has been taking breaks.  Also wonders if it is affecting her hip; has had a little more hip pain but has also been paying more attention to hip rotation and position.  No pain today.  PAIN:  Are you having pain? No   TODAY'S TREATMENT:    05/24/2021: Patient seen for aquatic therapy today.  Donned air cast on RLE with sneaker for ankle stability.  Treatment took place in water 3-4 feet deep depending upon activity.  Pt entered and exited the pool via stairs with rail.   Gastroc/Soleus stretch: functional stretch while descending stairs to enter water: performed step downs leading with LLE, standing on RLE with cues to maintain R hip in neutral. Once on bottom step performed blocked practice of step downs standing on RLE beginning with bilat > one UE > no UE support and then pushing back up on to step x 8 reps.      Standing with back to the wall and UE in "T", PT assisted with lifting/flexing RLE and crossing knee across midline while pt performed head, trunk and pelvis counter rotation to L  x 2 reps with 30 second hold.  PT assisted with maintaining hip IR during adduction.   [place] holding aqua bells for balance, performed across width of pool x 4 reps, standing on RLE and crossing over with LLE to facilitate R hip rotation in stance phase.  Cues provided for technique, RLE hip position, and min A for balance.  Placed ankle weight on R ankle: stood with UE support on aqua jogger pushed into wall for support while performing 1 set x 10 reps each hip ABD, HS curl, and hip extension open chain on RLE and then performed standing on RLE for SLS training.  Second set performed on each LE holding aqua jogger but away from the wall to decrease stability and reliance on UE.  PT assisted with support on R side to maintain weight shift over RLE, trunk and hip alignment; also provided cues to activate hip and knee extensors during stance phase.  Standing on submerged step under water with RLE, performed step up/over with LLE.  Pt had to touch L toes down on step between stepping up and down for balance; performed 10 reps with PT assisting with maintaining neutral R hip rotation, weight shifting forwards and backwards, and activation of R hip and knee extensors when shifting up and over RLE.  Greater difficulty with stepping backwards up onto step.  Performed gait across width of pool forwards focusing on increased stance time on RLE with increased step length with LLE.  Pt requires buoyancy of water for support for joint offloading and body weight support for gait training, to reduce fall risk with gait training and balance exercises with minimal UE support; exercises able to be performed safely in water without the risk of fall compared to those same exercises performed on land;  viscosity of water needed for resistance for strengthening.  Current of water provides perturbations for challenging static & dynamic standing balance.    05/19/2021: Answered pt's questions about the following:  Sit to  stands: pt asking about where her hand placement should go when performing at home whether she should cross them across her chest or place on thighs, discussed that it is pt preference and that goal is to work on proper positioning to work BLE instead of using BUE to push from mat. Performed x5 reps.   Pt asking if she should work on just holding the RW with just her L hand, discussed that this is not safe and she needs to be pushing the RW with BUE at all times, plus gets additional weight bearing through [place].   Pt wanting to know how she can do hip IR stretch at home that she has done in the pool, had pt lay supine with arms extended, LLE extended and then R knee in flexion and draping it across the body, dicussed making sure pt feels a stretch  that is comfortable and holding it for 30-45 seconds. Performed x3 reps with pt reporting relief. Pt did not need photo for home.   Verbally reviewed hip ABD hooklying exercise from HEP with pt asking if her RLE should be shaking when bringing LLE out into ABD and back to midline, discussed that her RLE is helping to stabilize and is also working during that exercise. Pt appreciate of information.   NMR: Pt standing with RLE with neutral foot positioning (needing assist in standing to help with proper position) and working on closed chain R hip IR, with LLE tapping to floor dots in front of RLE and then across midline x12 reps, able to progress with no UE support to floor dot in front of RLE but did need UE support when crossing midline, min guard. Performed additional x10 reps with 2 gumdrops (in same position as dots) with pt performing a gentle toe tap, min guard as needed for balance.     PATIENT EDUCATION: Education details: Gradually increasing wear time with new AFO; remove when spending an extended amount of time in sitting Person educated: Patient and Child(ren) Education method: Explanation Education comprehension: verbalized  understanding   HOME EXERCISE PROGRAM: [id number]   PT Short Term Goals - 05/19/21 0914       PT SHORT TERM GOAL #1   Title Pt will be independent with progressive HEP in order to build upon functional gains made in therapy (ALL STGs Due: 05/12/21)    Baseline pt reports compliance with HEP at home.    Time 4    Period Weeks    Status Achieved    Target Date 05/12/21      PT SHORT TERM GOAL #2   Title Patient will improve Berg Balance to >/= 43/56 to demo improved balance and reduced fall risk    Baseline 36/56 initial assessment; 41/56; 45/56 on 05/19/21    Time 4    Period Weeks    Status Achieved      PT SHORT TERM GOAL #3   Title Pt will improve gait speed to at least 1.85 ft/sec in order to demo improved household mobility.    Baseline .90 ft/sec with RW; 1.73 ft/sec; 1.66 ft/sec on 05/19/21    Time 4    Period Weeks    Status Not Met      PT SHORT TERM GOAL #4   Title Pt will ambulate at least >/= 600 ft with RW and supervision in order to demo improved household mobility.    Baseline not formally assessed during session, however pt able to ambulate with supervision.    Time 4    Period Weeks    Status Deferred      PT SHORT TERM GOAL #5   Title Pt will initiate Aquatic PT services and initiate Aquatic HEP    Time 4    Period Weeks    Status Achieved              PT Long Term Goals - 05/19/21 0914       PT LONG TERM GOAL #1   Title Pt will be independent with land and aquatic HEP in with family supervision in order to build upon functional gains made in therapy. ALL LTGS DUE 06/10/21    Baseline independent with current HEP; will benefit from progressive HEP    Time 8    Period Weeks    Status Revised    Target Date 06/10/21  PT LONG TERM GOAL #2   Title Patient will improve Berg Balance to >/= 47/56 to demo reduced fall risk and imbalance    Baseline 41/56, 45/56 on 05/19/21    Time 8    Period Weeks    Status Revised      PT LONG TERM GOAL #3    Title Pt will ambulate at least 500' outdoors with LRAD and supervision in order to demo improved community mobility.    Baseline 450' outdors with RW CGA/supervision    Time 8    Period Weeks    Status Revised      PT LONG TERM GOAL #4   Title Pt will go up and down 12 stairs with single handrail with supervision/min guard with step to pattern in order to safely go up/down the stairs in her house.    Baseline not yet assessed; 12 stairs with CGA/supervision bil rails    Time 8    Period Weeks    Status On-going      PT LONG TERM GOAL #5   Title Pt will perform 5x sit <> stand with UE support in 12 seconds or less with supervision in order to demo decr fall risk.    Baseline 23.69 seconds with min guard; 14.44 secs with UE support    Time 8    Period Weeks    Status Revised      PT LONG TERM GOAL #6   Title Pt will improve gait speed to at least 1.8 ft/sec in order to demo improved household mobility and decr fall risk.    Baseline .90 ft/sec with RW; 1.73 ft/sec, 1.66 ft/sec on 05/19/21    Time 8    Period Weeks    Status Revised                    Clinical Impression Statement Pt demonstrated improved hip IR ROM today but continues to require cues initially to attend to RLE position and WB through RLE.  PT focused on withdrawing verbal cues from activities to allow pt to self-monitor and self-correct.  Also focused on decreasing UE stability to facilitate increased stance time and activation on RLE.    Personal Factors and Comorbidities Comorbidity 3+;Past/Current Experience;Time since onset of injury/illness/exacerbation     Comorbidities anxiety, Gilbert's syndrome, obesity, HLD     Examination-Activity Limitations Bathing;Bend;Caring for Others;Toileting;Stand;Stairs;Squat;Reach Overhead;Locomotion Level;Transfers     Examination-Participation Restrictions Cleaning;Community Activity;Driving;Medication Management     Stability/Clinical Decision Making Evolving/Moderate  complexity     Rehab Potential Good     PT Frequency 2x / week     PT Duration 8 weeks     PT Treatment/Interventions ADLs/Self Care Home Management;Electrical Stimulation;DME Instruction;Gait training;Stair training;Functional mobility training;Therapeutic activities;Aquatic Therapy;Therapeutic exercise;Balance training;Neuromuscular re-education;Manual techniques;Orthotic Fit/Training;Patient/family education;Passive range of motion;Energy conservation;Vestibular;Visual/perceptual remediation/compensation     PT Next Visit Plan let pt know about 40 visit hard limit and see how she would like to proceed. Land therapy: closed chain hip IR strengthening and additional R hip strengthening, SLS tasks on RLE, tall/half kneeling, pt's balance has really improved might want to trial cane!    Aquatic: tall>half kneeling on submerged step, R SLS, step ups, R hip IR strengthening.     PT Home Exercise Plan [id number]     Consulted and Agree with Plan of Care Patient;Family member/caregiver     Rico Junker, PT, DPT 05/24/21    1:24 PM

## 2021-05-25 ENCOUNTER — Encounter: Payer: Self-pay | Admitting: *Deleted

## 2021-05-26 ENCOUNTER — Ambulatory Visit: Payer: 59 | Admitting: Occupational Therapy

## 2021-05-26 ENCOUNTER — Ambulatory Visit: Payer: 59 | Admitting: Physical Therapy

## 2021-05-26 ENCOUNTER — Other Ambulatory Visit: Payer: Self-pay

## 2021-05-26 DIAGNOSIS — I69351 Hemiplegia and hemiparesis following cerebral infarction affecting right dominant side: Secondary | ICD-10-CM | POA: Diagnosis not present

## 2021-05-26 DIAGNOSIS — M6281 Muscle weakness (generalized): Secondary | ICD-10-CM

## 2021-05-26 DIAGNOSIS — R2681 Unsteadiness on feet: Secondary | ICD-10-CM

## 2021-05-26 DIAGNOSIS — R41842 Visuospatial deficit: Secondary | ICD-10-CM

## 2021-05-26 DIAGNOSIS — R29818 Other symptoms and signs involving the nervous system: Secondary | ICD-10-CM

## 2021-05-26 DIAGNOSIS — R278 Other lack of coordination: Secondary | ICD-10-CM

## 2021-05-26 NOTE — Therapy (Signed)
[facility] [address] [place], [place], [address] Phone: [phone]   Fax:  [phone]  Occupational Therapy Treatment  Patient Details  Name: Felicia Chen MRN: [id number] Date of Birth: 07-Apr-1965 Referring Provider (OT): Dr. Genice Rouge   Encounter Date: 05/26/2021   OT End of Session - 05/26/21 1100     Visit Number 24    Number of Visits 29    Date for OT Re-Evaluation 07/02/21    Authorization Type UHC: VL 40 per discipline, no auth required    OT Start Time 0930    OT Stop Time 1010    OT Time Calculation (min) 40 min    Activity Tolerance Patient tolerated treatment well    Behavior During Therapy [facility] for tasks assessed/performed             Past Medical History:  Diagnosis Date   Anxiety    Gilbert's syndrome     Past Surgical History:  Procedure Laterality Date   ABDOMINAL HYSTERECTOMY N/A 06/11/2012   Procedure: HYSTERECTOMY ABDOMINAL;  Surgeon: [name], MD;  Location: [facility];  Service: Gynecology;  Laterality: N/A;   CESAREAN SECTION     x 4   CYSTOSCOPY N/A 06/11/2012   Procedure: CYSTOSCOPY;  Surgeon: [name], MD;  Location: [facility];  Service: Gynecology;  Laterality: N/A;   HERNIA REPAIR     LAPAROSCOPIC ASSISTED VAGINAL HYSTERECTOMY N/A 06/11/2012   Procedure: LAPAROSCOPIC ASSISTED VAGINAL HYSTERECTOMY atttempted;  Surgeon: [name], MD;  Location: [facility];  Service: Gynecology;  Laterality: N/A;  Attempted Laparoscopic assisted vaginal hysterectomy.   LAPAROSCOPIC LYSIS OF ADHESIONS N/A 06/11/2012   Procedure: LAPAROSCOPIC LYSIS OF ADHESIONS;  Surgeon: [name], MD;  Location: [facility];  Service: Gynecology;  Laterality: N/A;   SALPINGOOPHORECTOMY Bilateral 06/11/2012   Procedure: SALPINGO OOPHORECTOMY;  Surgeon: [name], MD;  Location: [facility];  Service: Gynecology;  Laterality: Bilateral;    There were no vitals filed for this  visit.   Subjective Assessment - 05/26/21 1100     Subjective  I've been cooking some at home too with close supervision    Patient is accompanied by: Family member    Pertinent History ICH 12/01/20 Lt frontal/parietal lobes w/ SAH extending over bilat cerebral hemispheres and cerebellum, Lt temporal lobe ischemic CVA w/ residual Rt hemiplegia. PMH: HLD, anxiety, covid-19, Gilbert's syndrome    Limitations fall risk    Patient Stated Goals get back to normal    Currently in Pain? No/denies            Pt cooked grilled cheese sandwich with min to mod cueing to think/plan ahead (gather all ingredients in refrigerator before moving away, getting necessary items out prior to tasks), and for safety with walker placement. Pt however did very well remembering safety w/ fall prevention - either using one hand countertop support or leaning hips into counter if 2 hands needed. Pt also remembered to turn off stove I'ly. Pt required cues for problem solving during task and also recommended only using 1 stove burner at a time.  Continued to recommend direct supervision for cooking tasks at home.   UBE x 8 min, level 3 for normal reciprocal movement pattern and UB conditioning                        OT Short Term Goals - 05/19/21 1008  OT SHORT TERM GOAL #1   Title Independent with HEP for RUE shoulder ROM, hand coordination and hand strength    Time 4    Period Weeks    Status Achieved    Target Date 03/16/21      OT SHORT TERM GOAL #2   Title Pt to perform all BADLS with no more than min assist    Time 4    Period Weeks    Status Achieved      OT SHORT TERM GOAL #3   Title Pt to write name and address with 50% or greater legibility    Time 4    Period Weeks    Status Achieved   90% legibility w/ extra time     OT SHORT TERM GOAL #4   Title Pt to prepare simple snack, sandwich, and microwaveable items consistently using DME prn with distant supervision only     Time 4    Period Weeks    Status Achieved   pt req'd close supervision d/t unsteadiness with standing at counter for reaching items and managing refrigerator.  05/16/21  Met at this level     OT SHORT TERM GOAL #5   Title Pt to improve coordination as evidenced by performing 9 hole peg test Rt hand in under 1 min. and 15 sec    Baseline 1 min. 45 sec    Time 4    Period Weeks    Status Achieved   52.87 sec     Additional Short Term Goals   Additional Short Term Goals Yes      OT SHORT TERM GOAL #6   Title Pt to grip strength Rt hand to 30 lbs or greater in prep for opening jars    Baseline 22 lbs (Lt = 65 lbs)    Time 4    Period Weeks    Status Achieved   36 lbs     OT SHORT TERM GOAL #7   Title Improve coordination Rt hand as evidenced by performing 9 hole peg test in 30 sec or less consistently    Baseline at eval: 1 min, 45 sec, at reassessment: 36.56    Time 3    Period Weeks    Status New      OT SHORT TERM GOAL #8   Title Pt to perform high level reaching RUE at 125* sh flexion to retrieve/replace up to 3 lb object from higher cabinet    Baseline at renewal: 125* w/ no weight    Time 3    Period Weeks    Status New               OT Long Term Goals - 05/26/21 1101       OT LONG TERM GOAL #1   Title Independent with strengthening HEP for Rt shoulder    Time 12    Period Weeks    Status On-going   will add to HEP closer to d/c     OT LONG TERM GOAL #2   Title Pt to fully attend to Rt side and return to using Rt hand as dominant hand 90% of the time safely for ADLS w/ task modifications prn    Time 12    Period Weeks    Status On-going      OT LONG TERM GOAL #3   Title Independent with all BADLS    Time 12    Period Weeks    Status On-going  assist w/ shower transfer and hooking bra     OT LONG TERM GOAL #4   Title Pt to perform simple cooking tasks with supervision/min cueing only    Time 12    Period Weeks    Status On-going   05/26/21: performed  in clinic w/ min to mod cues     OT LONG TERM GOAL #5   Title Improve coordination Rt hand as evidenced by performing 9 hole peg test in 45 sec or less    Baseline 1 min. 45 sec.    Time 12    Period Weeks    Status Achieved   36.56 sec     OT LONG TERM GOAL #6   Title Pt to improve grip strength Rt hand to 40 lbs or greater to open jars/containers    Baseline 22 lbs (Lt = 65 lbs)    Time 12    Period Weeks    Status Achieved   42 LBS     OT LONG TERM GOAL #7   Title Pt to write name at 90% legibility and 3 sentences at 75% or greater legibility    Time 12    Period Weeks    Status Achieved      OT LONG TERM GOAL #8   Title Pt to consistently perform environmental scanning in busy environment at 90% accuracy    Baseline at renewal: pt found 11/12 items with min cues    Period Weeks    Status New                   Plan - 05/26/21 1102     Clinical Impression Statement Pt progressing with cooking task w/ direct supervision. Pt overall did well w/ task w/ min to mod cueing to plan ahead and for walker placement    OT Occupational Profile and History Detailed Assessment- Review of Records and additional review of physical, cognitive, psychosocial history related to current functional performance    Occupational performance deficits (Please refer to evaluation for details): ADL's;IADL's;Work;Leisure    Body Structure / Function / Physical Skills ADL;Strength;Dexterity;Balance;Tone;Body mechanics;Edema;Proprioception;UE functional use;IADL;ROM;Endurance;Mobility;Coordination;Sensation;FMC;Decreased knowledge of use of DME    Cognitive Skills Attention;Memory;Perception    Rehab Potential Good    Clinical Decision Making Several treatment options, min-mod task modification necessary    Comorbidities Affecting Occupational Performance: May have comorbidities impacting occupational performance    Modification or Assistance to Complete Evaluation  No modification of tasks or  assist necessary to complete eval    OT Frequency 1x / week    OT Duration 6 weeks   for renewal period (anticipate only 4 weeks)   OT Treatment/Interventions Self-care/ADL training;DME and/or AE instruction;Moist Heat;Therapeutic activities;Compression bandaging;Splinting;Therapeutic exercise;Ultrasound;Cognitive remediation/compensation;Coping strategies training;Visual/perceptual remediation/compensation;Passive range of motion;Neuromuscular education;Electrical Stimulation;Cryotherapy;Manual Therapy;Patient/family education    Plan continue with RUE functional tasks, NMR    Consulted and Agree with Plan of Care Patient;Family member/caregiver    Family Member Consulted husband and daughter             Patient will benefit from skilled therapeutic intervention in order to improve the following deficits and impairments:   Body Structure / Function / Physical Skills: ADL, Strength, Dexterity, Balance, Tone, Body mechanics, Edema, Proprioception, UE functional use, IADL, ROM, Endurance, Mobility, Coordination, Sensation, FMC, Decreased knowledge of use of DME Cognitive Skills: Attention, Memory, Perception     Visit Diagnosis: Hemiplegia and hemiparesis following cerebral infarction affecting right dominant side (HCC)  Unsteadiness on feet  Visuospatial deficit  Other lack of coordination    Problem List Patient Active Problem List   Diagnosis Date Noted   Nausea without vomiting    Spastic hemiparesis (HCC)    History of hypertension    Neurogenic orthostatic hypotension (HCC)    Transaminitis    Right hemiplegia (HCC)    Hemorrhagic stroke (HCC)    Leucocytosis    Essential hypertension    Dyslipidemia    Right hemiparesis (HCC)    ICH (intracerebral hemorrhage) ([facility]) 12/01/2020   HYPERLIPIDEMIA 07/06/2009   OBESITY 07/06/2009    Kelli Churn, OTR/L 05/26/2021, 11:36 AM  [facility] [address]  [address] [place], [place], [address] Phone: [phone]   Fax:  [phone]  Name: Felicia Chen MRN: [id number] Date of Birth: 1966-01-10

## 2021-05-26 NOTE — Therapy (Signed)
OUTPATIENT PHYSICAL THERAPY TREATMENT NOTE   Patient Name: Felicia Chen MRN: [id number] DOB:[birth year], 56 y.o., female Today's Date: 05/26/2021  PCP: Aretta Nip, MD REFERRING PROVIDER: Aretta Nip, MD   PT End of Session - 05/26/21 0851     Visit Number 26    Number of Visits 30    Date for PT Re-Evaluation 06/10/21    Authorization Type UHC -40 visit limit per discipline hard max (restarted in Jan 2023)    Authorization - Visit Number 20    Authorization - Number of Visits 40    PT Start Time [phone]    Equipment Utilized During Treatment --   aqua dumbbells   Activity Tolerance Patient tolerated treatment well    Behavior During Therapy [facility] for tasks assessed/performed               Past Medical History:  Diagnosis Date   Anxiety    Gilbert's syndrome    Past Surgical History:  Procedure Laterality Date   ABDOMINAL HYSTERECTOMY N/A 06/11/2012   Procedure: HYSTERECTOMY ABDOMINAL;  Surgeon: Allena Katz, MD;  Location: [facility];  Service: Gynecology;  Laterality: N/A;   CESAREAN SECTION     x 4   CYSTOSCOPY N/A 06/11/2012   Procedure: CYSTOSCOPY;  Surgeon: Allena Katz, MD;  Location: [facility];  Service: Gynecology;  Laterality: N/A;   HERNIA REPAIR     LAPAROSCOPIC ASSISTED VAGINAL HYSTERECTOMY N/A 06/11/2012   Procedure: LAPAROSCOPIC ASSISTED VAGINAL HYSTERECTOMY atttempted;  Surgeon: Allena Katz, MD;  Location: [facility];  Service: Gynecology;  Laterality: N/A;  Attempted Laparoscopic assisted vaginal hysterectomy.   LAPAROSCOPIC LYSIS OF ADHESIONS N/A 06/11/2012   Procedure: LAPAROSCOPIC LYSIS OF ADHESIONS;  Surgeon: Allena Katz, MD;  Location: [facility];  Service: Gynecology;  Laterality: N/A;   SALPINGOOPHORECTOMY Bilateral 06/11/2012   Procedure: SALPINGO OOPHORECTOMY;  Surgeon: Allena Katz, MD;  Location: [facility];  Service: Gynecology;  Laterality: Bilateral;   Patient Active Problem List   Diagnosis Date Noted    Nausea without vomiting    Spastic hemiparesis (HCC)    History of hypertension    Neurogenic orthostatic hypotension (HCC)    Transaminitis    Right hemiplegia (HCC)    Hemorrhagic stroke (HCC)    Leucocytosis    Essential hypertension    Dyslipidemia    Right hemiparesis (HCC)    ICH (intracerebral hemorrhage) ([place]) 12/01/2020   HYPERLIPIDEMIA 07/06/2009   OBESITY 07/06/2009    REFERRING DIAG: Acute ischemic left MCA stroke   THERAPY DIAG:  Unsteadiness on feet  Muscle weakness (generalized)  Other symptoms and signs involving the nervous system  PERTINENT HISTORY:  anxiety, Gilbert's syndrome, obesity, HLD.   PRECAUTIONS: Fall   SUBJECTIVE: Reports her walking is feeling so much better with her AFO.  Notices her R leg isn't feeling as heavy to pick up.   PAIN:  Are you having pain? No   TODAY'S TREATMENT:     GAIT: Gait pattern: step through pattern, decreased stance time- Right, decreased hip/knee flexion- Right, decreased ankle dorsiflexion- Right, circumduction- Right, and wide BOS, RLE external rotation.  Distance walked: 130' Assistive device utilized: Environmental consultant - 2 wheeled with R AFO. Level of assistance: SBA Comments: Pt focusing on incr stance time on RLE and focusing on R hip internal rotation and having foot in a more neutral position. Pt with improvements in her gait today with less external rotation noted. Discussed when ambulating at home  to focus on quality of walking and focusing on foot position vs. Just trying to walk for speed.   In // bars:   Keeping RLE as stance leg on 4" step in neutral position (therapist helping pt get foot into this position) and then tapping LLE to cone x10 reps across midline for closed chain hip IR on RLE, progressed to 6" step x10 reps, and then performed with adding a 2nd cone where pt would tap LLE forward and then across midline x10 reps for additional SLS time on RLE. Pt needing BUE support, cued to keep a soft bend in  the RLE for improved knee control.   With RLE as stance leg on 6" step performing lateral step ups with RLE and LLE crossing midline to tap cone for additional closed chain R hip IR, performed x12 reps. Pt more challenged with lateral vs. Forward step ups.   Tandem gait with BUE support down and back x4 reps with BUE support, focus on hip IR and neutral foot position with RLE.   [place]; standing on RLE and crossing over with LLE for R hip rotation during stance phase x3 reps in // bars, then performed in other direction standing on LLE and crossing RLE across in front for hip IR. Cues for technique, RLE hip position.    Stance on RLE and stepping LLE over obstacle 2" with focus on neutral hip position, x12 reps, performing with UE support > none with min guard. Cued for slowed pace for more controlled SLS.      PATIENT EDUCATION: Education details: Pt asking about getting a leather toe cap on her current pair of shoes as they are more comfortable with her brace, discussed that it would be beneficial and why it would be. Pt to plan to go to [facility] next week to have this added. Discussed VL with pt (40 hard limit for each discipline during calendar year) and pt has used 20 so for. Pt reports that her husband is planning on getting a new job with new insurance and that her insurance might change soon. Pt would like to maintain 2x week for PT throughout remainder of March and determine if pt would need to drop down to 1x/week starting in April. Pt and family potentially looking into getting [employer] membership where pt can get an HEP for the pool and then also use aerobic activity such as the [place].  Person educated: Patient and Child(ren) Education method: Explanation Education comprehension: verbalized understanding   HOME EXERCISE PROGRAM: [id number]   PT Short Term Goals - 05/19/21 0914       PT SHORT TERM GOAL #1   Title Pt will be independent with progressive HEP in order to build  upon functional gains made in therapy (ALL STGs Due: 05/12/21)    Baseline pt reports compliance with HEP at home.    Time 4    Period Weeks    Status Achieved    Target Date 05/12/21      PT SHORT TERM GOAL #2   Title Patient will improve Berg Balance to >/= 43/56 to demo improved balance and reduced fall risk    Baseline 36/56 initial assessment; 41/56; 45/56 on 05/19/21    Time 4    Period Weeks    Status Achieved      PT SHORT TERM GOAL #3   Title Pt will improve gait speed to at least 1.85 ft/sec in order to demo improved household mobility.    Baseline .90 ft/sec  with RW; 1.73 ft/sec; 1.66 ft/sec on 05/19/21    Time 4    Period Weeks    Status Not Met      PT SHORT TERM GOAL #4   Title Pt will ambulate at least >/= 600 ft with RW and supervision in order to demo improved household mobility.    Baseline not formally assessed during session, however pt able to ambulate with supervision.    Time 4    Period Weeks    Status Deferred      PT SHORT TERM GOAL #5   Title Pt will initiate Aquatic PT services and initiate Aquatic HEP    Time 4    Period Weeks    Status Achieved              PT Long Term Goals - 05/19/21 0914       PT LONG TERM GOAL #1   Title Pt will be independent with land and aquatic HEP in with family supervision in order to build upon functional gains made in therapy. ALL LTGS DUE 06/10/21    Baseline independent with current HEP; will benefit from progressive HEP    Time 8    Period Weeks    Status Revised    Target Date 06/10/21      PT LONG TERM GOAL #2   Title Patient will improve Berg Balance to >/= 47/56 to demo reduced fall risk and imbalance    Baseline 41/56, 45/56 on 05/19/21    Time 8    Period Weeks    Status Revised      PT LONG TERM GOAL #3   Title Pt will ambulate at least 500' outdoors with LRAD and supervision in order to demo improved community mobility.    Baseline 450' outdors with RW CGA/supervision    Time 8    Period Weeks     Status Revised      PT LONG TERM GOAL #4   Title Pt will go up and down 12 stairs with single handrail with supervision/min guard with step to pattern in order to safely go up/down the stairs in her house.    Baseline not yet assessed; 12 stairs with CGA/supervision bil rails    Time 8    Period Weeks    Status On-going      PT LONG TERM GOAL #5   Title Pt will perform 5x sit <> stand with UE support in 12 seconds or less with supervision in order to demo decr fall risk.    Baseline 23.69 seconds with min guard; 14.44 secs with UE support    Time 8    Period Weeks    Status Revised      PT LONG TERM GOAL #6   Title Pt will improve gait speed to at least 1.8 ft/sec in order to demo improved household mobility and decr fall risk.    Baseline .90 ft/sec with RW; 1.73 ft/sec, 1.66 ft/sec on 05/19/21    Time 8    Period Weeks    Status Revised                    Clinical Impression Statement Pt demonstrating improved R hip IR during gait today when focusing on more neutral foot position. Continued to work on closed chain R hip IR strengthening, balance, and SLS tasks on RLE while trying to decr UE support and improve stance time. Pt tolerated session well, will continue to progress towards LTGs.  Personal Factors and Comorbidities Comorbidity 3+;Past/Current Experience;Time since onset of injury/illness/exacerbation     Comorbidities anxiety, Gilbert's syndrome, obesity, HLD     Examination-Activity Limitations Bathing;Bend;Caring for Others;Toileting;Stand;Stairs;Squat;Reach Overhead;Locomotion Level;Transfers     Examination-Participation Restrictions Cleaning;Community Activity;Driving;Medication Management     Stability/Clinical Decision Making Evolving/Moderate complexity     Rehab Potential Good     PT Frequency 2x / week     PT Duration 8 weeks     PT Treatment/Interventions ADLs/Self Care Home Management;Electrical Stimulation;DME Instruction;Gait training;Stair  training;Functional mobility training;Therapeutic activities;Aquatic Therapy;Therapeutic exercise;Balance training;Neuromuscular re-education;Manual techniques;Orthotic Fit/Training;Patient/family education;Passive range of motion;Energy conservation;Vestibular;Visual/perceptual remediation/compensation     PT Next Visit Plan  Land therapy: closed chain hip IR strengthening and additional R hip strengthening, SLS tasks on RLE, tall/half kneeling, pt's balance has really improved might want to trial cane!    Aquatic: tall>half kneeling on submerged step, R SLS, step ups, R hip IR strengthening.     PT Home Exercise Plan [id number]     Consulted and Agree with Plan of Care Patient;Family member/caregiver     Janann August, PT, DPT 05/26/21 8:52 AM

## 2021-05-27 ENCOUNTER — Other Ambulatory Visit: Payer: Self-pay | Admitting: Physical Medicine and Rehabilitation

## 2021-05-27 ENCOUNTER — Encounter: Payer: Self-pay | Admitting: Physical Medicine and Rehabilitation

## 2021-05-30 ENCOUNTER — Other Ambulatory Visit: Payer: Self-pay

## 2021-05-30 ENCOUNTER — Ambulatory Visit: Payer: 59 | Admitting: Occupational Therapy

## 2021-05-30 DIAGNOSIS — R2681 Unsteadiness on feet: Secondary | ICD-10-CM

## 2021-05-30 DIAGNOSIS — R278 Other lack of coordination: Secondary | ICD-10-CM

## 2021-05-30 DIAGNOSIS — R41842 Visuospatial deficit: Secondary | ICD-10-CM

## 2021-05-30 DIAGNOSIS — I69351 Hemiplegia and hemiparesis following cerebral infarction affecting right dominant side: Secondary | ICD-10-CM

## 2021-05-30 NOTE — Therapy (Signed)
[facility] [address] [place], [place], [address] Phone: [phone]   Fax:  [phone]  Occupational Therapy Treatment  Patient Details  Name: Felicia Chen MRN: [id number] Date of Birth: February 06, 1966 Referring Provider (OT): Dr. Courtney Heys   Encounter Date: 05/30/2021   OT End of Session - 05/30/21 0959     Visit Number 25    Number of Visits 29    Date for OT Re-Evaluation 07/02/21    Authorization Type UHC: VL 40 per discipline, no auth required    OT Start Time [phone]    OT Stop Time 1028    OT Time Calculation (min) 50 min    Activity Tolerance Patient tolerated treatment well    Behavior During Therapy [facility] for tasks assessed/performed             Past Medical History:  Diagnosis Date   Anxiety    Gilbert's syndrome     Past Surgical History:  Procedure Laterality Date   ABDOMINAL HYSTERECTOMY N/A 06/11/2012   Procedure: HYSTERECTOMY ABDOMINAL;  Surgeon: Allena Katz, MD;  Location: [facility];  Service: Gynecology;  Laterality: N/A;   CESAREAN SECTION     x 4   CYSTOSCOPY N/A 06/11/2012   Procedure: CYSTOSCOPY;  Surgeon: Allena Katz, MD;  Location: [facility];  Service: Gynecology;  Laterality: N/A;   HERNIA REPAIR     LAPAROSCOPIC ASSISTED VAGINAL HYSTERECTOMY N/A 06/11/2012   Procedure: LAPAROSCOPIC ASSISTED VAGINAL HYSTERECTOMY atttempted;  Surgeon: Allena Katz, MD;  Location: [facility];  Service: Gynecology;  Laterality: N/A;  Attempted Laparoscopic assisted vaginal hysterectomy.   LAPAROSCOPIC LYSIS OF ADHESIONS N/A 06/11/2012   Procedure: LAPAROSCOPIC LYSIS OF ADHESIONS;  Surgeon: Allena Katz, MD;  Location: [facility];  Service: Gynecology;  Laterality: N/A;   SALPINGOOPHORECTOMY Bilateral 06/11/2012   Procedure: SALPINGO OOPHORECTOMY;  Surgeon: Allena Katz, MD;  Location: [facility];  Service: Gynecology;  Laterality: Bilateral;    There were no vitals filed for this  visit.   Subjective Assessment - 05/30/21 0943     Subjective  I've been trying to do things at home I would have done before the stroke    Patient is accompanied by: Family member    Pertinent History ICH 12/01/20 Lt frontal/parietal lobes w/ SAH extending over bilat cerebral hemispheres and cerebellum, Lt temporal lobe ischemic CVA w/ residual Rt hemiplegia. PMH: HLD, anxiety, covid-19, Gilbert's syndrome    Limitations fall risk    Patient Stated Goals get back to normal    Currently in Pain? No/denies             Discussed safety considerations at home (sitting for using scissors, cutting). Recommended developing weekly schedule with at least one home task each day (laundry on Mon, meal planning, cooking, etc) Practiced cutting lines and curves w/ scissors while seated.   RUE functional high level reaching to place medium sized pegs in pegboard Rt hand for coordination while copying peg design for visual/perceptual skills w/ 100% accuracy  UBE x 8 mintues, level 3 for normal reciprocal movement pattern                          OT Short Term Goals - 05/19/21 1008       OT SHORT TERM GOAL #1   Title Independent with HEP for RUE shoulder ROM, hand coordination and hand strength    Time 4  Period Weeks    Status Achieved    Target Date 03/16/21      OT SHORT TERM GOAL #2   Title Pt to perform all BADLS with no more than min assist    Time 4    Period Weeks    Status Achieved      OT SHORT TERM GOAL #3   Title Pt to write name and address with 50% or greater legibility    Time 4    Period Weeks    Status Achieved   90% legibility w/ extra time     OT SHORT TERM GOAL #4   Title Pt to prepare simple snack, sandwich, and microwaveable items consistently using DME prn with distant supervision only    Time 4    Period Weeks    Status Achieved   pt req'd close supervision d/t unsteadiness with standing at counter for reaching items and managing  refrigerator.  05/16/21  Met at this level     OT SHORT TERM GOAL #5   Title Pt to improve coordination as evidenced by performing 9 hole peg test Rt hand in under 1 min. and 15 sec    Baseline 1 min. 45 sec    Time 4    Period Weeks    Status Achieved   52.87 sec     Additional Short Term Goals   Additional Short Term Goals Yes      OT SHORT TERM GOAL #6   Title Pt to grip strength Rt hand to 30 lbs or greater in prep for opening jars    Baseline 22 lbs (Lt = 65 lbs)    Time 4    Period Weeks    Status Achieved   36 lbs     OT SHORT TERM GOAL #7   Title Improve coordination Rt hand as evidenced by performing 9 hole peg test in 30 sec or less consistently    Baseline at eval: 1 min, 45 sec, at reassessment: 36.56    Time 3    Period Weeks    Status New      OT SHORT TERM GOAL #8   Title Pt to perform high level reaching RUE at 125* sh flexion to retrieve/replace up to 3 lb object from higher cabinet    Baseline at renewal: 125* w/ no weight    Time 3    Period Weeks    Status New               OT Long Term Goals - 05/26/21 1101       OT LONG TERM GOAL #1   Title Independent with strengthening HEP for Rt shoulder    Time 12    Period Weeks    Status On-going   will add to HEP closer to d/c     OT LONG TERM GOAL #2   Title Pt to fully attend to Rt side and return to using Rt hand as dominant hand 90% of the time safely for ADLS w/ task modifications prn    Time 12    Period Weeks    Status On-going      OT LONG TERM GOAL #3   Title Independent with all BADLS    Time 12    Period Weeks    Status On-going   assist w/ shower transfer and hooking bra     OT LONG TERM GOAL #4   Title Pt to perform simple cooking tasks with supervision/min cueing  only    Time 12    Period Weeks    Status On-going   05/26/21: performed in clinic w/ min to mod cues     OT LONG TERM GOAL #5   Title Improve coordination Rt hand as evidenced by performing 9 hole peg test in 45 sec  or less    Baseline 1 min. 45 sec.    Time 12    Period Weeks    Status Achieved   36.56 sec     OT LONG TERM GOAL #6   Title Pt to improve grip strength Rt hand to 40 lbs or greater to open jars/containers    Baseline 22 lbs (Lt = 65 lbs)    Time 12    Period Weeks    Status Achieved   42 LBS     OT LONG TERM GOAL #7   Title Pt to write name at 90% legibility and 3 sentences at 75% or greater legibility    Time 12    Period Weeks    Status Achieved      OT LONG TERM GOAL #8   Title Pt to consistently perform environmental scanning in busy environment at 90% accuracy    Baseline at renewal: pt found 11/12 items with min cues    Period Weeks    Status New                   Plan - 05/30/21 1017     Clinical Impression Statement Pt progressing towrads goals. Pt still requires cues for safety and attending to Rt side at times    OT Occupational Profile and History Detailed Assessment- Review of Records and additional review of physical, cognitive, psychosocial history related to current functional performance    Occupational performance deficits (Please refer to evaluation for details): ADL's;IADL's;Work;Leisure    Body Structure / Function / Physical Skills ADL;Strength;Dexterity;Balance;Tone;Body mechanics;Edema;Proprioception;UE functional use;IADL;ROM;Endurance;Mobility;Coordination;Sensation;FMC;Decreased knowledge of use of DME    Cognitive Skills Attention;Memory;Perception    Rehab Potential Good    Clinical Decision Making Several treatment options, min-mod task modification necessary    Comorbidities Affecting Occupational Performance: May have comorbidities impacting occupational performance    Modification or Assistance to Complete Evaluation  No modification of tasks or assist necessary to complete eval    OT Frequency 1x / week    OT Duration 6 weeks   for renewal period (anticipate only 4 weeks)   OT Treatment/Interventions Self-care/ADL training;DME and/or  AE instruction;Moist Heat;Therapeutic activities;Compression bandaging;Splinting;Therapeutic exercise;Ultrasound;Cognitive remediation/compensation;Coping strategies training;Visual/perceptual remediation/compensation;Passive range of motion;Neuromuscular education;Electrical Stimulation;Cryotherapy;Manual Therapy;Patient/family education    Plan continue with RUE functional tasks, NMR, anticipate d/c 06/13/21    Consulted and Agree with Plan of Care Patient;Family member/caregiver    Family Member Consulted husband and daughter             Patient will benefit from skilled therapeutic intervention in order to improve the following deficits and impairments:   Body Structure / Function / Physical Skills: ADL, Strength, Dexterity, Balance, Tone, Body mechanics, Edema, Proprioception, UE functional use, IADL, ROM, Endurance, Mobility, Coordination, Sensation, FMC, Decreased knowledge of use of DME Cognitive Skills: Attention, Memory, Perception     Visit Diagnosis: Hemiplegia and hemiparesis following cerebral infarction affecting right dominant side (HCC)  Unsteadiness on feet  Visuospatial deficit  Other lack of coordination    Problem List Patient Active Problem List   Diagnosis Date Noted   Nausea without vomiting    Spastic hemiparesis ([place])    History  of hypertension    Neurogenic orthostatic hypotension (HCC)    Transaminitis    Right hemiplegia (HCC)    Hemorrhagic stroke (HCC)    Leucocytosis    Essential hypertension    Dyslipidemia    Right hemiparesis (HCC)    ICH (intracerebral hemorrhage) ([place]) 12/01/2020   HYPERLIPIDEMIA 07/06/2009   OBESITY 07/06/2009    Carey Bullocks, OTR/L 05/30/2021, 10:21 AM  [facility] [address] [place], [place], [address] Phone: [phone]   Fax:  [phone]  Name: Felicia Chen MRN: [id number] Date of Birth: 1965/12/04

## 2021-05-31 ENCOUNTER — Ambulatory Visit: Payer: 59 | Admitting: Physical Therapy

## 2021-05-31 DIAGNOSIS — R29818 Other symptoms and signs involving the nervous system: Secondary | ICD-10-CM

## 2021-05-31 DIAGNOSIS — R2689 Other abnormalities of gait and mobility: Secondary | ICD-10-CM

## 2021-05-31 DIAGNOSIS — I69351 Hemiplegia and hemiparesis following cerebral infarction affecting right dominant side: Secondary | ICD-10-CM

## 2021-05-31 DIAGNOSIS — M6281 Muscle weakness (generalized): Secondary | ICD-10-CM

## 2021-05-31 DIAGNOSIS — R2681 Unsteadiness on feet: Secondary | ICD-10-CM

## 2021-05-31 NOTE — Therapy (Signed)
?OUTPATIENT PHYSICAL THERAPY TREATMENT NOTE ? ? ?Patient Name: Felicia Chen ?MRN: [id number] ?DOB:September 06, 1965, 56 y.o., female ?Today's Date: 05/31/2021 ? ?PCP: Aretta Nip, MD ?REFERRING PROVIDER: Aretta Nip, MD ? ? PT End of Session - 05/31/21 1027   ? ? Visit Number 27   ? Number of Visits 30   ? Date for PT Re-Evaluation 06/10/21   ? Authorization Type UHC -40 visit limit per discipline hard max (restarted in Jan 2023)   ? Authorization - Visit Number 21   ? Authorization - Number of Visits 40   ? PT Start Time [phone]   ? PT Stop Time 0929   ? PT Time Calculation (min) 46 min   ? Equipment Utilized During Treatment --   aqua dumbbells  ? Activity Tolerance Patient tolerated treatment well   ? Behavior During Therapy [facility] for tasks assessed/performed   ? ?  ?  ? ?  ? ? ?Past Medical History:  ?Diagnosis Date  ? Anxiety   ? Gilbert's syndrome   ? ?Past Surgical History:  ?Procedure Laterality Date  ? ABDOMINAL HYSTERECTOMY N/A 06/11/2012  ? Procedure: HYSTERECTOMY ABDOMINAL;  Surgeon: Allena Katz, MD;  Location: [facility];  Service: Gynecology;  Laterality: N/A;  ? CESAREAN SECTION    ? x 4  ? CYSTOSCOPY N/A 06/11/2012  ? Procedure: CYSTOSCOPY;  Surgeon: Allena Katz, MD;  Location: [facility];  Service: Gynecology;  Laterality: N/A;  ? HERNIA REPAIR    ? LAPAROSCOPIC ASSISTED VAGINAL HYSTERECTOMY N/A 06/11/2012  ? Procedure: LAPAROSCOPIC ASSISTED VAGINAL HYSTERECTOMY atttempted;  Surgeon: Allena Katz, MD;  Location: [facility];  Service: Gynecology;  Laterality: N/A;  Attempted Laparoscopic assisted vaginal hysterectomy.  ? LAPAROSCOPIC LYSIS OF ADHESIONS N/A 06/11/2012  ? Procedure: LAPAROSCOPIC LYSIS OF ADHESIONS;  Surgeon: Allena Katz, MD;  Location: [facility];  Service: Gynecology;  Laterality: N/A;  ? SALPINGOOPHORECTOMY Bilateral 06/11/2012  ? Procedure: SALPINGO OOPHORECTOMY;  Surgeon: Allena Katz, MD;  Location: [facility];  Service: Gynecology;  Laterality: Bilateral;   ? ?Patient Active Problem List  ? Diagnosis Date Noted  ? Nausea without vomiting   ? Spastic hemiparesis ([place])   ? History of hypertension   ? 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 Transaminitis   ? Right hemiplegia ([place])   ? Hemorrhagic stroke ([place])   ? Leucocytosis   ? Essential hypertension   ? Dyslipidemia   ? Right hemiparesis ([place])   ? ICH (intracerebral hemorrhage) ([place]) 12/01/2020  ? HYPERLIPIDEMIA 07/06/2009  ? OBESITY 07/06/2009  ? ? ?REFERRING DIAG: Acute ischemic left MCA stroke  ? ?THERAPY DIAG:  ?Hemiplegia and hemiparesis following cerebral infarction affecting right dominant side ([place]) ? ?Unsteadiness on feet ? ?Muscle weakness (generalized) ? ?Other symptoms and signs involving the nervous system ? ?Other abnormalities of gait and mobility ? ?PERTINENT HISTORY:  ?anxiety, Gilbert's syndrome, obesity, HLD.  ? ?PRECAUTIONS: Fall  ? ?SUBJECTIVE: Is still doing well with new AFO.  Is noticing an improvement in hip ROM, has been working a lot on it.   ? ?PAIN:  ?Are you having pain? No ? ? ?TODAY'S TREATMENT:  ? ?05/31/2021: ? Patient seen for aquatic therapy today.  Treatment took place in water 3-4 feet deep depending upon activity.  Pt entered and exited the pool via stairs with bilat rails.  Pt wearing air cast on RLE for increased ankle stability. ? ?When entering pool performed multiple step downs leading with LLE,  standing on RLE with verbal and tactile cues to rotate R hip anteriorly when descending.  At bottom step performed blocked practice of step downs, standing on RLE progressing from bilat UE support > light fingertip touch for support; continued to have great difficulty weight shifting back and pushing up through RLE. ? ?Holding bilat aqua dumbbells performed gait along length of pool from shallow <> deep with close supervision from PT with intermittent verbal cues for anterior/lateral weight shift to RLE, increasing R stance time, especially in deeper water due to buoyancy,  performed down and back x 3 laps.   ? ?Returned to 63f water and removed aqua dumbbells for UE support while performing 4 laps across width of pool: Tandem ?Gait with min A and verbal cues for WB through heel of RLE and then 4 laps [place] standing on RLE, crossing LLE in front with min A to maintain R hip neutral rotation.   ? ?Attempted to perform step backs onto submerged step with RLE but pt unable to activate hip and knee extensors.  Transitioned to sitting on bench and performed blocked practice of single leg sit > stand on RLE from elevated seat with UE support on aqua dumbbells beginning with LLE support against wall and then focusing on lifting LLE during stand > sit and sit > stand with mod A for weight shift, cues for activation and balance in standing. ? ?Performed one lap each down length of pool of swimming prone and supine with therapist providing mod A for flotation while providing cues for RUE and RLE reciprocal movement. ? ?Pt requires buoyancy of water for support for joint offloading for body weight support and proprioceptive input, to reduce fall risk with gait training and balance exercises with minimal UE support; exercises able to be performed safely in water without the risk of fall compared to those same exercises performed on land;  viscosity of water needed for resistance for strengthening.  Current of water provides perturbations for challenging static & dynamic standing balance. ? ? ?05/26/2021: ?GAIT: ?Gait pattern: step through pattern, decreased stance time- Right, decreased hip/knee flexion- Right, decreased ankle dorsiflexion- Right, circumduction- Right, and wide BOS, RLE external rotation.  ?Distance walked: 162 ?Assistive device utilized: WEnvironmental consultant- 2 wheeled with R AFO. ?Level of assistance: SBA ?Comments: Pt focusing on incr stance time on RLE and focusing on R hip internal rotation and having foot in a more neutral position. Pt with improvements in her gait today with less  external rotation noted. Discussed when ambulating at home to focus on quality of walking and focusing on foot position vs. Just trying to walk for speed.  ? ?In // bars:  ? ?Keeping RLE as stance leg on 4" step in neutral position (therapist helping pt get foot into this position) and then tapping LLE to cone x10 reps across midline for closed chain hip IR on RLE, progressed to 6" step x10 reps, and then performed with adding a 2nd cone where pt would tap LLE forward and then across midline x10 reps for additional SLS time on RLE. Pt needing BUE support, cued to keep a soft bend in the RLE for improved knee control.  ? ?With RLE as stance leg on 6" step performing lateral step ups with RLE and LLE crossing midline to tap cone for additional closed chain R hip IR, performed x12 reps. Pt more challenged with lateral vs. Forward step ups.  ? ?Tandem gait with BUE support down and back x4 reps with BUE  support, focus on hip IR and neutral foot position with RLE.  ? ?[place]; standing on RLE and crossing over with LLE for R hip rotation during stance phase x3 reps in // bars, then performed in other direction standing on LLE and crossing RLE across in front for hip IR. Cues for technique, RLE hip position.  ? ? ?Stance on RLE and stepping LLE over obstacle 2" with focus on neutral hip position, x12 reps, performing with UE support > none with min guard. Cued for slowed pace for more controlled SLS.  ? ? ? ? ?PATIENT EDUCATION: ?Education details: Ways to focus on using RLE >LLE during ADL and functional tasks at home. ?Person educated: Patient and Spouse ?Education method: Explanation ?Education comprehension: verbalized understanding ? ? ?HOME EXERCISE PROGRAM: ?[id number] ? ? PT Short Term Goals - 05/19/21 0914   ? ?  ? PT SHORT TERM GOAL #1  ? Title Pt will be independent with progressive HEP in order to build upon functional gains made in therapy (ALL STGs Due: 05/12/21)   ? Baseline pt reports compliance with HEP at  home.   ? Time 4   ? Period Weeks   ? Status Achieved   ? Target Date 05/12/21   ?  ? PT SHORT TERM GOAL #2  ? Title Patient will improve Berg Balance to >/= 43/56 to demo improved balance and reduced fall risk   ?

## 2021-06-02 ENCOUNTER — Other Ambulatory Visit: Payer: Self-pay

## 2021-06-02 ENCOUNTER — Encounter: Payer: Self-pay | Admitting: Physical Therapy

## 2021-06-02 ENCOUNTER — Ambulatory Visit: Payer: 59 | Admitting: Physical Therapy

## 2021-06-02 DIAGNOSIS — I69351 Hemiplegia and hemiparesis following cerebral infarction affecting right dominant side: Secondary | ICD-10-CM | POA: Diagnosis not present

## 2021-06-02 NOTE — Therapy (Signed)
?OUTPATIENT PHYSICAL THERAPY TREATMENT NOTE ? ? ?Patient Name: Felicia Chen ?MRN: [id number] ?DOB:07/29/1965, 56 y.o., female ?Today's Date: 06/02/2021 ? ?PCP: Aretta Nip, MD ?REFERRING PROVIDER: Aretta Nip, MD ? ? PT End of Session - 06/02/21 0850   ? ? Visit Number 28   ? Number of Visits 30   ? Date for PT Re-Evaluation 06/10/21   ? Authorization Type UHC -40 visit limit per discipline hard max (restarted in Jan 2023)   ? Authorization - Visit Number 22   ? Authorization - Number of Visits 40   ? PT Start Time 0848   ? PT Stop Time 4888   ? PT Time Calculation (min) 44 min   ? Equipment Utilized During Treatment Gait belt   ? Activity Tolerance Patient tolerated treatment well   ? Behavior During Therapy [facility] for tasks assessed/performed   ? ?  ?  ? ?  ? ? ?Past Medical History:  ?Diagnosis Date  ? Anxiety   ? Gilbert's syndrome   ? ?Past Surgical History:  ?Procedure Laterality Date  ? ABDOMINAL HYSTERECTOMY N/A 06/11/2012  ? Procedure: HYSTERECTOMY ABDOMINAL;  Surgeon: Allena Katz, MD;  Location: [facility];  Service: Gynecology;  Laterality: N/A;  ? CESAREAN SECTION    ? x 4  ? CYSTOSCOPY N/A 06/11/2012  ? Procedure: CYSTOSCOPY;  Surgeon: Allena Katz, MD;  Location: [facility];  Service: Gynecology;  Laterality: N/A;  ? HERNIA REPAIR    ? LAPAROSCOPIC ASSISTED VAGINAL HYSTERECTOMY N/A 06/11/2012  ? Procedure: LAPAROSCOPIC ASSISTED VAGINAL HYSTERECTOMY atttempted;  Surgeon: Allena Katz, MD;  Location: [facility];  Service: Gynecology;  Laterality: N/A;  Attempted Laparoscopic assisted vaginal hysterectomy.  ? LAPAROSCOPIC LYSIS OF ADHESIONS N/A 06/11/2012  ? Procedure: LAPAROSCOPIC LYSIS OF ADHESIONS;  Surgeon: Allena Katz, MD;  Location: [facility];  Service: Gynecology;  Laterality: N/A;  ? SALPINGOOPHORECTOMY Bilateral 06/11/2012  ? Procedure: SALPINGO OOPHORECTOMY;  Surgeon: Allena Katz, MD;  Location: [facility];  Service: Gynecology;  Laterality: Bilateral;  ? ?Patient  Active Problem List  ? Diagnosis Date Noted  ? Nausea without vomiting   ? Spastic hemiparesis ([place])   ? History of hypertension   ? 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 Transaminitis   ? Right hemiplegia ([place])   ? Hemorrhagic stroke ([place])   ? Leucocytosis   ? Essential hypertension   ? Dyslipidemia   ? Right hemiparesis ([place])   ? ICH (intracerebral hemorrhage) ([place]) 12/01/2020  ? HYPERLIPIDEMIA 07/06/2009  ? OBESITY 07/06/2009  ? ? ?REFERRING DIAG: Acute ischemic left MCA stroke  ? ?THERAPY DIAG:  ?Hemiplegia and hemiparesis following cerebral infarction affecting right dominant side ([place]) ? ?Unsteadiness on feet ? ?Muscle weakness (generalized) ? ?Other symptoms and signs involving the nervous system ? ?PERTINENT HISTORY:  ?anxiety, Gilbert's syndrome, obesity, HLD.  ? ?PRECAUTIONS: Fall  ? ?SUBJECTIVE: Doing more walking at home. Legs are feeling a little bit more sore.  ? ?PAIN:  ?Are you having pain? No ? ? ?TODAY'S TREATMENT:  ? ?Access Code: [email or web address] ?URL: [email or web address] ?Date: 06/02/2021 ?Prepared by: Janann August ? ?Exercises ?Supine Bridge with Mini Swiss Ball Between Knees - 2 x daily - 5 x weekly - 1-2 sets - 10 reps ?Seated Hip Adduction Isometrics with Ball - 2 x daily - 5 x weekly - 1-2 sets - 10 reps ?Bent Knee Fallouts (Mirrored) - 2 x daily - 5 x weekly - 1-2 sets - 10 reps ?  Supine Hip Flexion (Mirrored) - 2 x daily - 5 x weekly - 1-2 sets - 10 reps ?Sit to Stand with Armchair - 2 x daily - 5 x weekly - 1 sets - 10 reps ? ?Upgraded pt's HEP to include more standing strength/balance:  ?Mini Squat with Counter Support - 2 x daily - 5 x weekly - 1-2 sets - 10 reps - performed with green tband around thighs for glute/hip ABD strengthening, pt standing with BUE support at RW x10 reps  ?Single Leg Balance with Clock Reach - 1 x daily - 5 x weekly - 1-2 sets - 10 reps - performed with RLE as stance leg and performing a cross body toe tap with LLE for incr SLS time and  closed chain R hip IR. 2 sets of 10 reps, performed with single UE support, cues for a soft bend in RLE.  ?Step Taps on High Step - 1 x daily - 5 x weekly - 1-2 sets - 10 reps - with RLE as stance leg - tapping L toes to first and then 2nd step ?Forward Step Up - 1 x daily - 5 x weekly - 1-2 sets - 10 reps- with RLE on step and tapping LLE to 2nd step 2 sets of 10 reps  ? ? ? ?Lower Extremity Strengthening:  ?Lateral Step Ups: RLE, 6", Sets: 1, Reps: 10 ?-cues for technique and proper RLE positioning, pt fatigues easily with lateral step ups, tendency to have incr R knee flexion when performing during stance time. Decided to practice more in PT before adding to HEP for home. ? ?Forward step downs: RLE on 4" step and tapping LLE to floor (toes first and then heels) x10 reps, then progressed to 6" step x10 reps with BUE support, pt needing initial cues for proper neutral position of RLE  ? ? ?SciFit at level 2.5 with BUE/BLE for strengthening/activity tolerance for 5 minutes, cued pt to try to keep RLE in a neutral position. Did well initially, but when pt fatigued towards end and when performed at end of session, was harder for pt to maintain positioning.  ? ? ? ? ? ?PATIENT EDUCATION: ?Education details: Updated HEP. Pt reporting her husband may not be getting a new job and might still have current insurance. Discussed will decide on plan at end of march (frequency of PT) and briefly discussed options for pro bono clinics when pt's insurance companies have lapsed ([place], [place]). Pt also looking at joining [facility] as to be able to use aerobic equipment, pool.   ?Person educated: Patient  ?Education method: Explanation, Handout ?Education comprehension: verbalized understanding ? ? ?HOME EXERCISE PROGRAM: ?[id number] ? ? PT Short Term Goals - 05/19/21 0914   ? ?  ? PT SHORT TERM GOAL #1  ? Title Pt will be independent with progressive HEP in order to build upon functional gains made in therapy (ALL STGs Due:  05/12/21)   ? Baseline pt reports compliance with HEP at home.   ? Time 4   ? Period Weeks   ? Status Achieved   ? Target Date 05/12/21   ?  ? PT SHORT TERM GOAL #2  ? Title Patient will improve Berg Balance to >/= 43/56 to demo improved balance and reduced fall risk   ? Baseline 36/56 initial assessment; 41/56; 45/56 on 05/19/21   ? Time 4   ? Period Weeks   ? Status Achieved   ?  ? PT SHORT TERM GOAL #3  ?  Title Pt will improve gait speed to at least 1.85 ft/sec in order to demo improved household mobility.   ? Baseline .90 ft/sec with RW; 1.73 ft/sec; 1.66 ft/sec on 05/19/21   ? Time 4   ? Period Weeks   ? Status Not Met   ?  ? PT SHORT TERM GOAL #4  ? Title Pt will ambulate at least >/= 600 ft with RW and supervision in order to demo improved household mobility.   ? Baseline not formally assessed during session, however pt able to ambulate with supervision.   ? Time 4   ? Period Weeks   ? Status Deferred   ?  ? PT SHORT TERM GOAL #5  ? Title Pt will initiate Aquatic PT services and initiate Aquatic HEP   ? Time 4   ? Period Weeks   ? Status Achieved   ? ?  ?  ? ?  ? ? ? PT Long Term Goals - 05/19/21 0914   ? ?  ? PT LONG TERM GOAL #1  ? Title Pt will be independent with land and aquatic HEP in with family supervision in order to build upon functional gains made in therapy. ALL LTGS DUE 06/10/21   ? Baseline independent with current HEP; will benefit from progressive HEP   ? Time 8   ? Period Weeks   ? Status Revised   ? Target Date 06/10/21   ?  ? PT LONG TERM GOAL #2  ? Title Patient will improve Berg Balance to >/= 47/56 to demo reduced fall risk and imbalance   ? Baseline 41/56, 45/56 on 05/19/21   ? Time 8   ? Period Weeks   ? Status Revised   ?  ? PT LONG TERM GOAL #3  ? Title Pt will ambulate at least 500' outdoors with LRAD and supervision in order to demo improved community mobility.   ? Baseline 450' outdors with RW CGA/supervision   ? Time 8   ? Period Weeks   ? Status Revised   ?  ? PT LONG TERM GOAL #4  ?  Title Pt will go up and down 12 stairs with single handrail with supervision/min guard with step to pattern in order to safely go up/down the stairs in her house.   ? Baseline not yet assessed; 12 stairs with CGA/sup

## 2021-06-02 NOTE — Patient Instructions (Signed)
Access Code: [email or web address] ?URL: [email or web address] ?Date: 06/02/2021 ?Prepared by: Janann August ? ?Exercises ?Supine Bridge with Mini Swiss Ball Between Knees - 2 x daily - 5 x weekly - 1-2 sets - 10 reps ?Seated Hip Adduction Isometrics with Ball - 2 x daily - 5 x weekly - 1-2 sets - 10 reps ?Bent Knee Fallouts (Mirrored) - 2 x daily - 5 x weekly - 1-2 sets - 10 reps ?Supine Hip Flexion (Mirrored) - 2 x daily - 5 x weekly - 1-2 sets - 10 reps ?Sit to Stand with Armchair - 2 x daily - 5 x weekly - 1 sets - 10 reps ?Mini Squat with Counter Support - 2 x daily - 5 x weekly - 1-2 sets - 10 reps ?Single Leg Balance with Clock Reach - 1 x daily - 5 x weekly - 1-2 sets - 10 reps ?Step Taps on High Step - 1 x daily - 5 x weekly - 1-2 sets - 10 reps ?Forward Step Up - 1 x daily - 5 x weekly - 1-2 sets - 10 reps ? ?

## 2021-06-06 ENCOUNTER — Other Ambulatory Visit: Payer: Self-pay

## 2021-06-06 ENCOUNTER — Ambulatory Visit: Payer: 59 | Admitting: Occupational Therapy

## 2021-06-06 DIAGNOSIS — M6281 Muscle weakness (generalized): Secondary | ICD-10-CM

## 2021-06-06 DIAGNOSIS — I69351 Hemiplegia and hemiparesis following cerebral infarction affecting right dominant side: Secondary | ICD-10-CM

## 2021-06-06 DIAGNOSIS — R278 Other lack of coordination: Secondary | ICD-10-CM

## 2021-06-06 DIAGNOSIS — R41842 Visuospatial deficit: Secondary | ICD-10-CM

## 2021-06-06 NOTE — Patient Instructions (Signed)
?  Strengthening: Resisted Flexion ? ? ?Hold tubing with __RT___ arm(s) at side. Pull forward and up. Move shoulder through pain-free range of motion. ?Repeat __10__ times per set.  Do _1-2_ sessions per day , every other day ? ? ?Strengthening: Resisted Extension ? ? ?Hold tubing in __RT___ hand(s), arm forward. Pull arm back, elbow straight. ?Repeat _10___ times per set. Do _1-2___ sessions per day, every other day. ? ? ?

## 2021-06-06 NOTE — Therapy (Signed)
[facility] [address] [place], [place], [address] Phone: [phone]   Fax:  [phone]  Occupational Therapy Treatment  Patient Details  Name: Felicia Chen MRN: [id number] Date of Birth: Sep 20, 1965 Referring Provider (OT): Dr. Genice Rouge   Encounter Date: 06/06/2021   OT End of Session - 06/06/21 0913     Visit Number 26    Number of Visits 29    Date for OT Re-Evaluation 07/02/21    Authorization Type UHC: VL 40 per discipline, no auth required    OT Start Time 0845    OT Stop Time 0930    OT Time Calculation (min) 45 min    Activity Tolerance Patient tolerated treatment well    Behavior During Therapy [facility] for tasks assessed/performed             Past Medical History:  Diagnosis Date   Anxiety    Gilbert's syndrome     Past Surgical History:  Procedure Laterality Date   ABDOMINAL HYSTERECTOMY N/A 06/11/2012   Procedure: HYSTERECTOMY ABDOMINAL;  Surgeon: [name], MD;  Location: [facility];  Service: Gynecology;  Laterality: N/A;   CESAREAN SECTION     x 4   CYSTOSCOPY N/A 06/11/2012   Procedure: CYSTOSCOPY;  Surgeon: [name], MD;  Location: [facility];  Service: Gynecology;  Laterality: N/A;   HERNIA REPAIR     LAPAROSCOPIC ASSISTED VAGINAL HYSTERECTOMY N/A 06/11/2012   Procedure: LAPAROSCOPIC ASSISTED VAGINAL HYSTERECTOMY atttempted;  Surgeon: [name], MD;  Location: [facility];  Service: Gynecology;  Laterality: N/A;  Attempted Laparoscopic assisted vaginal hysterectomy.   LAPAROSCOPIC LYSIS OF ADHESIONS N/A 06/11/2012   Procedure: LAPAROSCOPIC LYSIS OF ADHESIONS;  Surgeon: [name], MD;  Location: [facility];  Service: Gynecology;  Laterality: N/A;   SALPINGOOPHORECTOMY Bilateral 06/11/2012   Procedure: SALPINGO OOPHORECTOMY;  Surgeon: [name], MD;  Location: [facility];  Service: Gynecology;  Laterality: Bilateral;    There were no vitals filed for this  visit.   Subjective Assessment - 06/06/21 0907     Subjective  I had to take a break from exercises for a day and that seemed to help    Patient is accompanied by: Family member    Pertinent History ICH 12/01/20 Lt frontal/parietal lobes w/ SAH extending over bilat cerebral hemispheres and cerebellum, Lt temporal lobe ischemic CVA w/ residual Rt hemiplegia. PMH: HLD, anxiety, covid-19, Gilbert's syndrome    Limitations fall risk    Patient Stated Goals get back to normal    Currently in Pain? No/denies             Began checking remaining goals in prep for d/c next session - see goal section for details.   Pt issued theraband HEP - see pt instructions. Pt would benefit from review for proper carryover  Pt copying peg design with 100% accuracy w/ small pegs for Rt hand coordination.   Environmental scanning finding 15/15 items on first pass.                      OT Education - 06/06/21 0914     Education Details low range theraband HEP    Person(s) Educated Patient;Child(ren)    Methods Explanation;Demonstration;Verbal cues;Handout    Comprehension Verbalized understanding;Returned demonstration;Verbal cues required;Need further instruction              OT Short Term Goals - 06/06/21 1884  OT SHORT TERM GOAL #1   Title Independent with HEP for RUE shoulder ROM, hand coordination and hand strength    Time 4    Period Weeks    Status Achieved    Target Date 03/16/21      OT SHORT TERM GOAL #2   Title Pt to perform all BADLS with no more than min assist    Time 4    Period Weeks    Status Achieved      OT SHORT TERM GOAL #3   Title Pt to write name and address with 50% or greater legibility    Time 4    Period Weeks    Status Achieved   90% legibility w/ extra time     OT SHORT TERM GOAL #4   Title Pt to prepare simple snack, sandwich, and microwaveable items consistently using DME prn with distant supervision only    Time 4    Period  Weeks    Status Achieved   pt req'd close supervision d/t unsteadiness with standing at counter for reaching items and managing refrigerator.  05/16/21  Met at this level     OT SHORT TERM GOAL #5   Title Pt to improve coordination as evidenced by performing 9 hole peg test Rt hand in under 1 min. and 15 sec    Baseline 1 min. 45 sec    Time 4    Period Weeks    Status Achieved   52.87 sec     OT SHORT TERM GOAL #6   Title Pt to grip strength Rt hand to 30 lbs or greater in prep for opening jars    Baseline 22 lbs (Lt = 65 lbs)    Time 4    Period Weeks    Status Achieved   36 lbs     OT SHORT TERM GOAL #7   Title Improve coordination Rt hand as evidenced by performing 9 hole peg test in 30 sec or less consistently    Baseline at eval: 1 min, 45 sec, at reassessment: 36.56    Time 3    Period Weeks    Status Achieved   30 sec     OT SHORT TERM GOAL #8   Title Pt to perform high level reaching RUE at 125* sh flexion to retrieve/replace up to 3 lb object from higher cabinet    Baseline at renewal: 125* w/ no weight    Time 3    Period Weeks    Status Achieved   4 consecutive times              OT Long Term Goals - 06/06/21 0917       OT LONG TERM GOAL #1   Title Independent with strengthening HEP for Rt shoulder    Time 12    Period Weeks    Status Achieved      OT LONG TERM GOAL #2   Title Pt to fully attend to Rt side and return to using Rt hand as dominant hand 90% of the time safely for ADLS w/ task modifications prn    Time 12    Period Weeks    Status On-going      OT LONG TERM GOAL #3   Title Independent with all BADLS    Time 12    Period Weeks    Status Achieved   except supervision for shower transfer for safety/fall prevention     OT LONG TERM GOAL #4  Title Pt to perform simple cooking tasks with supervision/min cueing only    Time 12    Period Weeks    Status On-going   05/26/21: performed in clinic w/ min to mod cues     OT LONG TERM GOAL #5    Title Improve coordination Rt hand as evidenced by performing 9 hole peg test in 45 sec or less    Baseline 1 min. 45 sec.    Time 12    Period Weeks    Status Achieved   36.56 sec, 06/06/21: 30 sec     OT LONG TERM GOAL #6   Title Pt to improve grip strength Rt hand to 40 lbs or greater to open jars/containers    Baseline 22 lbs (Lt = 65 lbs)    Time 12    Period Weeks    Status Achieved   42 LBS     OT LONG TERM GOAL #7   Title Pt to write name at 90% legibility and 3 sentences at 75% or greater legibility    Time 12    Period Weeks    Status Achieved      OT LONG TERM GOAL #8   Title Pt to consistently perform environmental scanning in busy environment at 90% accuracy    Baseline at renewal: pt found 11/12 items with min cues    Period Weeks    Status Achieved   Pt found 15/15 items on first pass                  Plan - 06/06/21 0930     Clinical Impression Statement Pt progressing towrads goals. Pt still requires cues for safety and attending to Rt side at times    OT Occupational Profile and History Detailed Assessment- Review of Records and additional review of physical, cognitive, psychosocial history related to current functional performance    Occupational performance deficits (Please refer to evaluation for details): ADL's;IADL's;Work;Leisure    Body Structure / Function / Physical Skills ADL;Strength;Dexterity;Balance;Tone;Body mechanics;Edema;Proprioception;UE functional use;IADL;ROM;Endurance;Mobility;Coordination;Sensation;FMC;Decreased knowledge of use of DME    Cognitive Skills Attention;Memory;Perception    Rehab Potential Good    Clinical Decision Making Several treatment options, min-mod task modification necessary    Comorbidities Affecting Occupational Performance: May have comorbidities impacting occupational performance    Modification or Assistance to Complete Evaluation  No modification of tasks or assist necessary to complete eval    OT  Frequency 1x / week    OT Duration 6 weeks   for renewal period (anticipate only 4 weeks)   OT Treatment/Interventions Self-care/ADL training;DME and/or AE instruction;Moist Heat;Therapeutic activities;Compression bandaging;Splinting;Therapeutic exercise;Ultrasound;Cognitive remediation/compensation;Coping strategies training;Visual/perceptual remediation/compensation;Passive range of motion;Neuromuscular education;Electrical Stimulation;Cryotherapy;Manual Therapy;Patient/family education    Plan assess remaining goals, review theraband HEP, D/C next session    Consulted and Agree with Plan of Care Patient;Family member/caregiver    Family Member Consulted husband and daughter             Patient will benefit from skilled therapeutic intervention in order to improve the following deficits and impairments:   Body Structure / Function / Physical Skills: ADL, Strength, Dexterity, Balance, Tone, Body mechanics, Edema, Proprioception, UE functional use, IADL, ROM, Endurance, Mobility, Coordination, Sensation, FMC, Decreased knowledge of use of DME Cognitive Skills: Attention, Memory, Perception     Visit Diagnosis: Hemiplegia and hemiparesis following cerebral infarction affecting right dominant side (HCC)  Other lack of coordination  Visuospatial deficit  Muscle weakness (generalized)    Problem List Patient Active Problem  List   Diagnosis Date Noted   Nausea without vomiting    Spastic hemiparesis (HCC)    History of hypertension    Neurogenic orthostatic hypotension (HCC)    Transaminitis    Right hemiplegia (HCC)    Hemorrhagic stroke (HCC)    Leucocytosis    Essential hypertension    Dyslipidemia    Right hemiparesis (HCC)    ICH (intracerebral hemorrhage) (HCC) 12/01/2020   HYPERLIPIDEMIA 07/06/2009   OBESITY 07/06/2009    Kelli Churn, OTR/L 06/06/2021, 10:24 AM  [facility] [address] [place], [place], [address] Phone: [phone]   Fax:  [phone]  Name: ZERIYAH PLETT MRN: [id number] Date of Birth: [birth year]

## 2021-06-07 ENCOUNTER — Ambulatory Visit: Payer: 59 | Admitting: Physical Therapy

## 2021-06-07 DIAGNOSIS — R278 Other lack of coordination: Secondary | ICD-10-CM

## 2021-06-07 DIAGNOSIS — I69351 Hemiplegia and hemiparesis following cerebral infarction affecting right dominant side: Secondary | ICD-10-CM | POA: Diagnosis not present

## 2021-06-07 DIAGNOSIS — R29818 Other symptoms and signs involving the nervous system: Secondary | ICD-10-CM

## 2021-06-07 DIAGNOSIS — R2689 Other abnormalities of gait and mobility: Secondary | ICD-10-CM

## 2021-06-07 DIAGNOSIS — R2681 Unsteadiness on feet: Secondary | ICD-10-CM

## 2021-06-07 DIAGNOSIS — M6281 Muscle weakness (generalized): Secondary | ICD-10-CM

## 2021-06-07 NOTE — Therapy (Signed)
?OUTPATIENT PHYSICAL THERAPY TREATMENT NOTE ? ? ?Patient Name: Felicia Chen ?MRN: [id number] ?DOB:1965/12/02, 56 y.o., female ?Today's Date: 06/07/2021 ? ?PCP: Aretta Nip, MD ?REFERRING PROVIDER: Aretta Nip, MD ? ? PT End of Session - 06/07/21 1351   ? ? Visit Number 29   ? Number of Visits 30   ? Date for PT Re-Evaluation 06/10/21   ? Authorization Type UHC -40 visit limit per discipline hard max (restarted in Jan 2023)   ? Authorization - Visit Number 23   ? Authorization - Number of Visits 40   ? PT Start Time 0845   ? PT Stop Time 0930   ? PT Time Calculation (min) 45 min   ? Activity Tolerance Patient tolerated treatment well   ? Behavior During Therapy [facility] for tasks assessed/performed   ? ?  ?  ? ?  ? ? ? ?Past Medical History:  ?Diagnosis Date  ? Anxiety   ? Gilbert's syndrome   ? ?Past Surgical History:  ?Procedure Laterality Date  ? ABDOMINAL HYSTERECTOMY N/A 06/11/2012  ? Procedure: HYSTERECTOMY ABDOMINAL;  Surgeon: Allena Katz, MD;  Location: [place] [facility];  Service: Gynecology;  Laterality: N/A;  ? CESAREAN SECTION    ? x 4  ? CYSTOSCOPY N/A 06/11/2012  ? Procedure: CYSTOSCOPY;  Surgeon: Allena Katz, MD;  Location: [place] [facility];  Service: Gynecology;  Laterality: N/A;  ? HERNIA REPAIR    ? LAPAROSCOPIC ASSISTED VAGINAL HYSTERECTOMY N/A 06/11/2012  ? Procedure: LAPAROSCOPIC ASSISTED VAGINAL HYSTERECTOMY atttempted;  Surgeon: Allena Katz, MD;  Location: [place] [facility];  Service: Gynecology;  Laterality: N/A;  Attempted Laparoscopic assisted vaginal hysterectomy.  ? LAPAROSCOPIC LYSIS OF ADHESIONS N/A 06/11/2012  ? Procedure: LAPAROSCOPIC LYSIS OF ADHESIONS;  Surgeon: Allena Katz, MD;  Location: [place] [facility];  Service: Gynecology;  Laterality: N/A;  ? SALPINGOOPHORECTOMY Bilateral 06/11/2012  ? Procedure: SALPINGO OOPHORECTOMY;  Surgeon: Allena Katz, MD;  Location: [place] [facility];  Service: Gynecology;  Laterality: Bilateral;  ? ?Patient Active Problem List  ? Diagnosis Date Noted  ?  Nausea without vomiting   ? Spastic hemiparesis ([place])   ? History of hypertension   ? 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 Transaminitis   ? Right hemiplegia ([place])   ? Hemorrhagic stroke ([employer])   ? Leucocytosis   ? Essential hypertension   ? Dyslipidemia   ? Right hemiparesis ([place])   ? ICH (intracerebral hemorrhage) ([place]) 12/01/2020  ? HYPERLIPIDEMIA 07/06/2009  ? OBESITY 07/06/2009  ? ? ?REFERRING DIAG: Acute ischemic left MCA stroke  ? ?THERAPY DIAG:  ?Hemiplegia and hemiparesis following cerebral infarction affecting right dominant side ([place]) ? ?Other lack of coordination ? ?Unsteadiness on feet ? ?Muscle weakness (generalized) ? ?Other symptoms and signs involving the nervous system ? ?Other abnormalities of gait and mobility ? ?PERTINENT HISTORY:  ?anxiety, Gilbert's syndrome, obesity, HLD.  ? ?PRECAUTIONS: Fall  ? ?SUBJECTIVE: Took a few days off of HEP and LE feel better; less sore.  Asking about a [employer] membership to be able to use the pool until her pool opens up in May.  Concerned about therapy visit limit. ? ?PAIN:  ?Are you having pain? No ? ? ?TODAY'S TREATMENT:  ? ?06/07/2021: ?Patient seen for aquatic therapy today.  Treatment took place in water 3-4 feet deep depending upon activity.  Pt entered and exited the pool via stairs with rails; Air cast on R ankle. ? ?On bottom step with LUE support on rail,  performed repeated LLE step downs and step back up standing on RLE: cued pt to perform step back up slowly to facilitate increased weight shift and activation of RLE into extension.  Also performed blocked practice backwards and lateral step ups standing on RLE on submerged step with LUE support again cuing pt to perform very slowly to facilitate increased weight shift and activation through RLE.  Continued right SLS training with sit <> stand from bench: LUE support on wall and LLE lifted off floor performed multiple R single leg sit <> stand with SLS x 5-6 seconds with PT providing cues for  anterior and lateral weight shift over RLE and to maintain in standing.  Attempted to maintain SLS while lifting hand off wall x 1-2 seconds with min-mod A from PT.   ? ?Gait without UE support across length of pool shallow <> deep with cues for increased stance time on R - no episodes of RLE floating or externally rotating during swing phase today even in deeper water.  No LOB without UE support and pt able to perform small reciprocal UE swing.   ? ?Swimming across length of pool with flotation belt on first in supine and then prone performing back stroke in supine with cues to utilize RLE. ? ?Pt requires buoyancy of water for support for joint offloading for body weight support and proprioceptive input, to reduce fall risk with gait training and balance exercises with minimal UE support; exercises able to be performed safely in water without the risk of fall compared to those same exercises performed on land;  viscosity of water needed for resistance for strengthening.  Current of water provides perturbations for challenging static & dynamic standing balance. ? ? ? ?06/02/2021: ?Access Code: [email or web address] ?URL: [email or web address] ?Date: 06/02/2021 ?Prepared by: Janann August ? ?Exercises ?Supine Bridge with Mini Swiss Ball Between Knees - 2 x daily - 5 x weekly - 1-2 sets - 10 reps ?Seated Hip Adduction Isometrics with Ball - 2 x daily - 5 x weekly - 1-2 sets - 10 reps ?Bent Knee Fallouts (Mirrored) - 2 x daily - 5 x weekly - 1-2 sets - 10 reps ?Supine Hip Flexion (Mirrored) - 2 x daily - 5 x weekly - 1-2 sets - 10 reps ?Sit to Stand with Armchair - 2 x daily - 5 x weekly - 1 sets - 10 reps ?Upgraded pt's HEP to include more standing strength/balance:  ?Mini Squat with Counter Support - 2 x daily - 5 x weekly - 1-2 sets - 10 reps - performed with green tband around thighs for glute/hip ABD strengthening, pt standing with BUE support at RW x10 reps  ?Single Leg Balance with Clock Reach - 1 x daily - 5  x weekly - 1-2 sets - 10 reps - performed with RLE as stance leg and performing a cross body toe tap with LLE for incr SLS time and closed chain R hip IR. 2 sets of 10 reps, performed with single UE support, cues for a soft bend in RLE.  ?Step Taps on High Step - 1 x daily - 5 x weekly - 1-2 sets - 10 reps - with RLE as stance leg - tapping L toes to first and then 2nd step ?Forward Step Up - 1 x daily - 5 x weekly - 1-2 sets - 10 reps- with RLE on step and tapping LLE to 2nd step 2 sets of 10 reps  ?Lower Extremity Strengthening:  ?Lateral Step Ups: RLE, 6", Sets: 1, Reps:  10 ?-cues for technique and proper RLE positioning, pt fatigues easily with lateral step ups, tendency to have incr R knee flexion when performing during stance time. Decided to practice more in PT before adding to HEP for home ?Forward step downs: RLE on 4" step and tapping LLE to floor (toes first and then heels) x10 reps, then progressed to 6" step x10 reps with BUE support, pt needing initial cues for proper neutral position of RLE ?SciFit at level 2.5 with BUE/BLE for strengthening/activity tolerance for 5 minutes, cued pt to try to keep RLE in a neutral position. Did well initially, but when pt fatigued towards end and when performed at end of session, was harder for pt to maintain positioning.  ? ?05/31/2021: ?Patient seen for aquatic therapy today.  Treatment took place in water 3-4 feet deep depending upon activity.  Pt entered and exited the pool via stairs with bilat rails.  Pt wearing air cast on RLE for increased ankle stability. ?  ?When entering pool performed multiple step downs leading with LLE, standing on RLE with verbal and tactile cues to rotate R hip anteriorly when descending.  At bottom step performed blocked practice of step downs, standing on RLE progressing from bilat UE support > light fingertip touch for support; continued to have great difficulty weight shifting back and pushing up through RLE. ?  ?Holding bilat aqua  dumbbells performed gait along length of pool from shallow <> deep with close supervision from PT with intermittent verbal cues for anterior/lateral weight shift to RLE, increasing R stance time, especiall

## 2021-06-07 NOTE — Therapy (Deleted)
.  oprcne ?

## 2021-06-10 ENCOUNTER — Ambulatory Visit: Payer: 59

## 2021-06-13 ENCOUNTER — Other Ambulatory Visit: Payer: Self-pay

## 2021-06-13 ENCOUNTER — Ambulatory Visit: Payer: 59 | Admitting: Occupational Therapy

## 2021-06-13 DIAGNOSIS — M6281 Muscle weakness (generalized): Secondary | ICD-10-CM

## 2021-06-13 DIAGNOSIS — R278 Other lack of coordination: Secondary | ICD-10-CM

## 2021-06-13 DIAGNOSIS — I69351 Hemiplegia and hemiparesis following cerebral infarction affecting right dominant side: Secondary | ICD-10-CM | POA: Diagnosis not present

## 2021-06-13 NOTE — Therapy (Signed)
[facility], [place], [address] ?Phone: [phone]   Fax:  [phone] ? ?Occupational Therapy Treatment ? ?Patient Details  ?Name: Felicia Chen ?MRN: [id number] ?Date of Birth: 02-Oct-1965 ?Referring Provider (OT): Dr. Courtney Heys ? ? ?Encounter Date: 06/13/2021 ? ? OT End of Session - 06/13/21 0851   ? ? Visit Number 27   ? Number of Visits 29   ? Date for OT Re-Evaluation 07/02/21   ? Authorization Type UHC: VL 40 per discipline, no auth required   ? OT Start Time [phone]   ? OT Stop Time 8270   ? OT Time Calculation (min) 30 min   ? Activity Tolerance Patient tolerated treatment well   ? Behavior During Therapy [facility] for tasks assessed/performed   ? ?  ?  ? ?  ? ? ?Past Medical History:  ?Diagnosis Date  ? Anxiety   ? Gilbert's syndrome   ? ? ?Past Surgical History:  ?Procedure Laterality Date  ? ABDOMINAL HYSTERECTOMY N/A 06/11/2012  ? Procedure: HYSTERECTOMY ABDOMINAL;  Surgeon: Allena Katz, MD;  Location: [facility];  Service: Gynecology;  Laterality: N/A;  ? CESAREAN SECTION    ? x 4  ? CYSTOSCOPY N/A 06/11/2012  ? Procedure: CYSTOSCOPY;  Surgeon: Allena Katz, MD;  Location: [facility];  Service: Gynecology;  Laterality: N/A;  ? HERNIA REPAIR    ? LAPAROSCOPIC ASSISTED VAGINAL HYSTERECTOMY N/A 06/11/2012  ? Procedure: LAPAROSCOPIC ASSISTED VAGINAL HYSTERECTOMY atttempted;  Surgeon: Allena Katz, MD;  Location: [facility];  Service: Gynecology;  Laterality: N/A;  Attempted Laparoscopic assisted vaginal hysterectomy.  ? LAPAROSCOPIC LYSIS OF ADHESIONS N/A 06/11/2012  ? Procedure: LAPAROSCOPIC LYSIS OF ADHESIONS;  Surgeon: Allena Katz, MD;  Location: [facility];  Service: Gynecology;  Laterality: N/A;  ? SALPINGOOPHORECTOMY Bilateral 06/11/2012  ? Procedure: SALPINGO OOPHORECTOMY;  Surgeon: Allena Katz, MD;  Location: [facility];  Service: Gynecology;  Laterality: Bilateral;  ? ? ?There were no vitals filed for this  visit. ? ? Subjective Assessment - 06/13/21 0850   ? ? Subjective  I can't believe this is our last visit   ? Patient is accompanied by: Family member   ? Pertinent History ICH 12/01/20 Lt frontal/parietal lobes w/ SAH extending over bilat cerebral hemispheres and cerebellum, Lt temporal lobe ischemic CVA w/ residual Rt hemiplegia. PMH: HLD, anxiety, covid-19, Gilbert's syndrome   ? Limitations fall risk   ? Patient Stated Goals get back to normal   ? Currently in Pain? No/denies   ? ?  ?  ? ?  ? ? ?Reviewed goals and progress to date. Discussed which HEP's to continue ? ?Reviewed theraband HEP - pt did well w/ only cues for starting position ?UBE x 10 min, level 3 for reciprocal movement ? ? ? ? ? ? ? ? ? ? ? ? ? ? ? ? ? ? ? ? ? ? ? OT Short Term Goals - 06/06/21 0916   ? ?  ? OT SHORT TERM GOAL #1  ? Title Independent with HEP for RUE shoulder ROM, hand coordination and hand strength   ? Time 4   ? Period Weeks   ? Status Achieved   ? Target Date 03/16/21   ?  ? OT SHORT TERM GOAL #2  ? Title Pt to perform all BADLS with no more than min assist   ? Time 4   ? Period Weeks   ?  Status Achieved   ?  ? OT SHORT TERM GOAL #3  ? Title Pt to write name and address with 50% or greater legibility   ? Time 4   ? Period Weeks   ? Status Achieved   90% legibility w/ extra time  ?  ? OT SHORT TERM GOAL #4  ? Title Pt to prepare simple snack, sandwich, and microwaveable items consistently using DME prn with distant supervision only   ? Time 4   ? Period Weeks   ? Status Achieved   pt req'd close supervision d/t unsteadiness with standing at counter for reaching items and managing refrigerator.  05/16/21  Met at this level  ?  ? OT SHORT TERM GOAL #5  ? Title Pt to improve coordination as evidenced by performing 9 hole peg test Rt hand in under 1 min. and 15 sec   ? Baseline 1 min. 45 sec   ? Time 4   ? Period Weeks   ? Status Achieved   52.87 sec  ?  ? OT SHORT TERM GOAL #6  ? Title Pt to grip strength Rt hand to 30 lbs or  greater in prep for opening jars   ? Baseline 22 lbs (Lt = 65 lbs)   ? Time 4   ? Period Weeks   ? Status Achieved   36 lbs  ?  ? OT SHORT TERM GOAL #7  ? Title Improve coordination Rt hand as evidenced by performing 9 hole peg test in 30 sec or less consistently   ? Baseline at eval: 1 min, 45 sec, at reassessment: 36.56   ? Time 3   ? Period Weeks   ? Status Achieved   30 sec  ?  ? OT SHORT TERM GOAL #8  ? Title Pt to perform high level reaching RUE at 125* sh flexion to retrieve/replace up to 3 lb object from higher cabinet   ? Baseline at renewal: 125* w/ no weight   ? Time 3   ? Period Weeks   ? Status Achieved   4 consecutive times  ? ?  ?  ? ?  ? ? ? ? OT Long Term Goals - 06/13/21 0852   ? ?  ? OT LONG TERM GOAL #1  ? Title Independent with strengthening HEP for Rt shoulder   ? Time 12   ? Period Weeks   ? Status Achieved   ?  ? OT LONG TERM GOAL #2  ? Title Pt to fully attend to Rt side and return to using Rt hand as dominant hand 90% of the time safely for ADLS w/ task modifications prn   ? Time 12   ? Period Weeks   ? Status Partially Met   using Rt hand as dominant 90% of time for ADLS, attending to Rt side, but occasionally forgets  ?  ? OT LONG TERM GOAL #3  ? Title Independent with all BADLS   ? Time 12   ? Period Weeks   ? Status Achieved   except supervision for shower transfer for safety/fall prevention  ?  ? OT LONG TERM GOAL #4  ? Title Pt to perform simple cooking tasks with supervision/min cueing only   ? Time 12   ? Period Weeks   ? Status Achieved   05/26/21: performed in clinic w/ min to mod cues, pt doing more consistently at home now w/ less cueing  ?  ? OT LONG TERM GOAL #5  ?  Title Improve coordination Rt hand as evidenced by performing 9 hole peg test in 45 sec or less   ? Baseline 1 min. 45 sec.   ? Time 12   ? Period Weeks   ? Status Achieved   36.56 sec, 06/06/21: 30 sec  ?  ? OT LONG TERM GOAL #6  ? Title Pt to improve grip strength Rt hand to 40 lbs or greater to open jars/containers    ? Baseline 22 lbs (Lt = 65 lbs)   ? Time 12   ? Period Weeks   ? Status Achieved   42 LBS  ?  ? OT LONG TERM GOAL #7  ? Title Pt to write name at 90% legibility and 3 sentences at 75% or greater legibility   ? Time 12   ? Period Weeks   ? Status Achieved   ?  ? OT LONG TERM GOAL #8  ? Title Pt to consistently perform environmental scanning in busy environment at 90% accuracy   ? Baseline at renewal: pt found 11/12 items with min cues   ? Period Weeks   ? Status Achieved   Pt found 15/15 items on first pass  ? ?  ?  ? ?  ? ? ? ? ? ? ? ? Plan - 06/13/21 0856   ? ? Clinical Impression Statement Pt has met all STG's and LTG's at this time.   ? OT Occupational Profile and History Detailed Assessment- Review of Records and additional review of physical, cognitive, psychosocial history related to current functional performance   ? Occupational performance deficits (Please refer to evaluation for details): ADL's;IADL's;Work;Leisure   ? Body Structure / Function / Physical Skills ADL;Strength;Dexterity;Balance;Tone;Body mechanics;Edema;Proprioception;UE functional use;IADL;ROM;Endurance;Mobility;Coordination;Sensation;FMC;Decreased knowledge of use of DME   ? Cognitive Skills Attention;Memory;Perception   ? Rehab Potential Good   ? Clinical Decision Making Several treatment options, min-mod task modification necessary   ? Comorbidities Affecting Occupational Performance: May have comorbidities impacting occupational performance   ? Modification or Assistance to Complete Evaluation  No modification of tasks or assist necessary to complete eval   ? OT Frequency 1x / week   ? OT Duration 6 weeks   for renewal period (anticipate only 4 weeks)  ? OT Treatment/Interventions Self-care/ADL training;DME and/or AE instruction;Moist Heat;Therapeutic activities;Compression bandaging;Splinting;Therapeutic exercise;Ultrasound;Cognitive remediation/compensation;Coping strategies training;Visual/perceptual remediation/compensation;Passive  range of motion;Neuromuscular education;Electrical Stimulation;Cryotherapy;Manual Therapy;Patient/family education   ? Plan D/C O.T.   ? Consulted and Agree with Plan of Care Patient;Family member/caregiver

## 2021-06-14 ENCOUNTER — Ambulatory Visit: Payer: 59 | Admitting: Physical Therapy

## 2021-06-14 DIAGNOSIS — R29818 Other symptoms and signs involving the nervous system: Secondary | ICD-10-CM

## 2021-06-14 DIAGNOSIS — R2681 Unsteadiness on feet: Secondary | ICD-10-CM

## 2021-06-14 DIAGNOSIS — R2689 Other abnormalities of gait and mobility: Secondary | ICD-10-CM

## 2021-06-14 DIAGNOSIS — M6281 Muscle weakness (generalized): Secondary | ICD-10-CM

## 2021-06-14 DIAGNOSIS — I69351 Hemiplegia and hemiparesis following cerebral infarction affecting right dominant side: Secondary | ICD-10-CM | POA: Diagnosis not present

## 2021-06-14 MED ORDER — BACLOFEN 10 MG PO TABS: 10.0000 mg | ORAL_TABLET | Freq: Every day | ORAL | 5 refills | Status: DC

## 2021-06-14 NOTE — Addendum Note (Signed)
Addended by: Caro Hight on: 06/14/2021 12:05 PM ? ? Modules accepted: Orders ? ?

## 2021-06-14 NOTE — Therapy (Signed)
?OUTPATIENT PHYSICAL THERAPY TREATMENT NOTE ? ? ?Patient Name: Felicia Chen ?MRN: [id number] ?DOB:June 18, 1965, 56 y.o., female ?Today's Date: 06/14/2021 ? ?PCP: Aretta Nip, MD ?REFERRING PROVIDER: Dr. Dagoberto Ligas ? ? PT End of Session - 06/14/21 1027   ? ? Visit Number 30   ? Number of Visits 30   ? Date for PT Re-Evaluation 06/10/21   ? Authorization Type UHC -40 visit limit per discipline hard max (restarted in Jan 2023)   ? Authorization - Visit Number 24   ? Authorization - Number of Visits 40   ? PT Start Time 0845   ? PT Stop Time 0930   ? PT Time Calculation (min) 45 min   ? Activity Tolerance Patient tolerated treatment well   ? Behavior During Therapy [name] for tasks assessed/performed   ? ?  ?  ? ?  ? ? ? ? ?Past Medical History:  ?Diagnosis Date  ? Anxiety   ? Gilbert's syndrome   ? ?Past Surgical History:  ?Procedure Laterality Date  ? ABDOMINAL HYSTERECTOMY N/A 06/11/2012  ? Procedure: HYSTERECTOMY ABDOMINAL;  Surgeon: Allena Katz, MD;  Location: [facility];  Service: Gynecology;  Laterality: N/A;  ? CESAREAN SECTION    ? x 4  ? CYSTOSCOPY N/A 06/11/2012  ? Procedure: CYSTOSCOPY;  Surgeon: Allena Katz, MD;  Location: [facility];  Service: Gynecology;  Laterality: N/A;  ? HERNIA REPAIR    ? LAPAROSCOPIC ASSISTED VAGINAL HYSTERECTOMY N/A 06/11/2012  ? Procedure: LAPAROSCOPIC ASSISTED VAGINAL HYSTERECTOMY atttempted;  Surgeon: Allena Katz, MD;  Location: [facility];  Service: Gynecology;  Laterality: N/A;  Attempted Laparoscopic assisted vaginal hysterectomy.  ? LAPAROSCOPIC LYSIS OF ADHESIONS N/A 06/11/2012  ? Procedure: LAPAROSCOPIC LYSIS OF ADHESIONS;  Surgeon: Allena Katz, MD;  Location: [facility];  Service: Gynecology;  Laterality: N/A;  ? SALPINGOOPHORECTOMY Bilateral 06/11/2012  ? Procedure: SALPINGO OOPHORECTOMY;  Surgeon: Allena Katz, MD;  Location: [facility];  Service: Gynecology;  Laterality: Bilateral;  ? ?Patient Active Problem List  ? Diagnosis Date Noted  ? Nausea without  vomiting   ? Spastic hemiparesis ([place])   ? History of hypertension   ? 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 Transaminitis   ? Right hemiplegia ([place])   ? Hemorrhagic stroke ([place])   ? Leucocytosis   ? Essential hypertension   ? Dyslipidemia   ? Right hemiparesis ([place])   ? ICH (intracerebral hemorrhage) ([place]) 12/01/2020  ? HYPERLIPIDEMIA 07/06/2009  ? OBESITY 07/06/2009  ? ? ?REFERRING DIAG: Acute ischemic left MCA stroke  ? ?THERAPY DIAG:  ?Hemiplegia and hemiparesis following cerebral infarction affecting right dominant side ([place]) ? ?Muscle weakness (generalized) ? ?Unsteadiness on feet ? ?Other abnormalities of gait and mobility ? ?Other symptoms and signs involving the nervous system ? ?PERTINENT HISTORY:  ?anxiety, Gilbert's syndrome, obesity, HLD.  ? ?PRECAUTIONS: Fall  ? ?SUBJECTIVE: Cancelled land visit last week due to having a fall at night; BP was low and took sleep medication which caused her to lose her balance when she got up.  Fell into her night stand and bruised her arm.  Has been asking husband to get up with her now at night.  Is going to ask today about membership.  Is still interested in attending the [facility] at [facility]. ? ?PAIN:  ?Are you having pain? No ? ? ?TODAY'S TREATMENT:  ? ?06/14/2021: ? ?Patient seen for aquatic therapy today.  Treatment took place in water 3-4  feet deep depending upon activity.  Pt entered and exited the pool via stairs.  Donned air cast on R ankle. ? ?Treatment session focused on review of aquatic HEP wearing 3lb ankle weights on each LE.  Reviewed the following exercises with patient and provided pt with instructions for how to increase challenge by decreasing UE support from wall > barbell against wall >barbell away from wall>no barbell.  Continued to provide cues for full weight shift to RLE and neutral hip rotation on R.  ?Access Code: [email or web address] (POOL HEP) ?URL: [email or web address] ?Date: 06/14/2021 ?Prepared by: Misty Stanley ? ?Exercises ?- Standing Hip Abduction at [place]  - 1 x daily - 7 x weekly - 2 sets - 10 reps ?- Single Leg Stance at Pool Wall (standing on RLE) - 1 x daily - 7 x weekly - 3 reps - 10 seconds hold ?- Squat  - 1 x daily - 7 x weekly - 2 sets - 10 reps ?- Side Stepping to R and L  - 1 x daily - 7 x weekly - 4 sets ?- Backward Walking  - 1 x daily - 7 x weekly - 4 sets ?- [place] at [facility] standing on R, crossing over with L - 1 x daily - 7 x weekly - 4 sets ?- Step down foward, step up backwards  - 1 x daily - 7 x weekly - 2 sets - 10 reps ? ?Pt requires buoyancy of water for support for joint offloading for body weight support and proprioceptive input, to reduce fall risk with gait training and balance exercises with minimal UE support; exercises able to be performed safely in water without the risk of fall compared to those same exercises performed on land;  viscosity of water needed for resistance for strengthening.  Current of water provides perturbations for challenging static & dynamic standing balance. ? ?06/07/2021: ?Patient seen for aquatic therapy today.  Treatment took place in water 3-4 feet deep depending upon activity.  Pt entered and exited the pool via stairs with rails; Air cast on R ankle. ? ?On bottom step with LUE support on rail, performed repeated LLE step downs and step back up standing on RLE: cued pt to perform step back up slowly to facilitate increased weight shift and activation of RLE into extension.  Also performed blocked practice backwards and lateral step ups standing on RLE on submerged step with LUE support again cuing pt to perform very slowly to facilitate increased weight shift and activation through RLE.  Continued right SLS training with sit <> stand from bench: LUE support on wall and LLE lifted off floor performed multiple R single leg sit <> stand with SLS x 5-6 seconds with PT providing cues for anterior and lateral weight shift over RLE and to maintain in  standing.  Attempted to maintain SLS while lifting hand off wall x 1-2 seconds with min-mod A from PT.   ? ?Gait without UE support across length of pool shallow <> deep with cues for increased stance time on R - no episodes of RLE floating or externally rotating during swing phase today even in deeper water.  No LOB without UE support and pt able to perform small reciprocal UE swing.   ? ?Swimming across length of pool with flotation belt on first in supine and then prone performing back stroke in supine with cues to utilize RLE. ? ?Pt requires buoyancy of water for support for joint offloading for body weight support and proprioceptive  input, to reduce fall risk with gait training and balance exercises with minimal UE support; exercises able to be performed safely in water without the risk of fall compared to those same exercises performed on land;  viscosity of water needed for resistance for strengthening.  Current of water provides perturbations for challenging static & dynamic standing balance. ? ? ?06/02/2021: ?Access Code: [email or web address] ?URL: [email or web address] ?Date: 06/02/2021 ?Prepared by: Janann August ? ?Exercises ?Supine Bridge with Mini Swiss Ball Between Knees - 2 x daily - 5 x weekly - 1-2 sets - 10 reps ?Seated Hip Adduction Isometrics with Ball - 2 x daily - 5 x weekly - 1-2 sets - 10 reps ?Bent Knee Fallouts (Mirrored) - 2 x daily - 5 x weekly - 1-2 sets - 10 reps ?Supine Hip Flexion (Mirrored) - 2 x daily - 5 x weekly - 1-2 sets - 10 reps ?Sit to Stand with Armchair - 2 x daily - 5 x weekly - 1 sets - 10 reps ?Upgraded pt's HEP to include more standing strength/balance:  ?Mini Squat with Counter Support - 2 x daily - 5 x weekly - 1-2 sets - 10 reps - performed with green tband around thighs for glute/hip ABD strengthening, pt standing with BUE support at RW x10 reps  ?Single Leg Balance with Clock Reach - 1 x daily - 5 x weekly - 1-2 sets - 10 reps - performed with RLE as stance leg  and performing a cross body toe tap with LLE for incr SLS time and closed chain R hip IR. 2 sets of 10 reps, performed with single UE support, cues for a soft bend in RLE.  ?Step Taps on High Step - 1 x daily - 5 x w

## 2021-06-16 ENCOUNTER — Ambulatory Visit: Payer: 59

## 2021-06-16 ENCOUNTER — Encounter: Payer: Self-pay | Admitting: Physical Therapy

## 2021-06-16 DIAGNOSIS — I69351 Hemiplegia and hemiparesis following cerebral infarction affecting right dominant side: Secondary | ICD-10-CM | POA: Diagnosis not present

## 2021-06-16 DIAGNOSIS — R2681 Unsteadiness on feet: Secondary | ICD-10-CM

## 2021-06-16 DIAGNOSIS — M6281 Muscle weakness (generalized): Secondary | ICD-10-CM

## 2021-06-16 DIAGNOSIS — R2689 Other abnormalities of gait and mobility: Secondary | ICD-10-CM

## 2021-06-16 NOTE — Therapy (Signed)
?OUTPATIENT PHYSICAL THERAPY TREATMENT NOTE/DISCHARGE SUMMARY ? ? ?Patient Name: Felicia Chen ?MRN: [id number] ?DOB:1966-03-16, 56 y.o., female ?Today's Date: 06/16/2021 ? ?PCP: Aretta Nip, MD ?REFERRING PROVIDER: Dr. Dagoberto Ligas ? ?PHYSICAL THERAPY DISCHARGE SUMMARY ? ?Visits from Start of Care: 31 ? ?Current functional level related to goals / functional outcomes: ?See Clinical Impression Statement ?  ?Remaining deficits: ?Land and Aquatic HEP Provided ?  ?Education / Equipment: ?HEP Provided  ? ?Patient agrees to discharge. Patient goals were met. Patient is being discharged due to meeting the stated rehab goals. ? ? PT End of Session - 06/16/21 0845   ? ? Visit Number 31   ? Number of Visits 31   ? Date for PT Re-Evaluation 06/17/21   ? Authorization Type [employer] -40 visit limit per discipline hard max (restarted in Jan 2023)   ? Authorization - Visit Number 25   ? Authorization - Number of Visits 40   ? PT Start Time 0845   ? PT Stop Time 7673   ? PT Time Calculation (min) 50 min   ? Activity Tolerance Patient tolerated treatment well   ? Behavior During Therapy [facility] for tasks assessed/performed   ? ?  ?  ? ?  ? ? ?Past Medical History:  ?Diagnosis Date  ? Anxiety   ? Gilbert's syndrome   ? ?Past Surgical History:  ?Procedure Laterality Date  ? ABDOMINAL HYSTERECTOMY N/A 06/11/2012  ? Procedure: HYSTERECTOMY ABDOMINAL;  Surgeon: Allena Katz, MD;  Location: [place];  Service: Gynecology;  Laterality: N/A;  ? CESAREAN SECTION    ? x 4  ? CYSTOSCOPY N/A 06/11/2012  ? Procedure: CYSTOSCOPY;  Surgeon: Allena Katz, MD;  Location: [facility];  Service: Gynecology;  Laterality: N/A;  ? HERNIA REPAIR    ? LAPAROSCOPIC ASSISTED VAGINAL HYSTERECTOMY N/A 06/11/2012  ? Procedure: LAPAROSCOPIC ASSISTED VAGINAL HYSTERECTOMY atttempted;  Surgeon: Allena Katz, MD;  Location: [place];  Service: Gynecology;  Laterality: N/A;  Attempted Laparoscopic assisted vaginal hysterectomy.  ? LAPAROSCOPIC LYSIS OF ADHESIONS  N/A 06/11/2012  ? Procedure: LAPAROSCOPIC LYSIS OF ADHESIONS;  Surgeon: Allena Katz, MD;  Location: [facility];  Service: Gynecology;  Laterality: N/A;  ? SALPINGOOPHORECTOMY Bilateral 06/11/2012  ? Procedure: SALPINGO OOPHORECTOMY;  Surgeon: Allena Katz, MD;  Location: [facility];  Service: Gynecology;  Laterality: Bilateral;  ? ?Patient Active Problem List  ? Diagnosis Date Noted  ? Nausea without vomiting   ? Spastic hemiparesis ([place])   ? History of hypertension   ? 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 Transaminitis   ? Right hemiplegia ([place])   ? Hemorrhagic stroke ([place])   ? Leucocytosis   ? Essential hypertension   ? Dyslipidemia   ? Right hemiparesis ([place])   ? ICH (intracerebral hemorrhage) ([place]) 12/01/2020  ? HYPERLIPIDEMIA 07/06/2009  ? OBESITY 07/06/2009  ? ? ?REFERRING DIAG: Acute ischemic left MCA stroke  ? ?THERAPY DIAG:  ?Hemiplegia and hemiparesis following cerebral infarction affecting right dominant side ([place]) ? ?Muscle weakness (generalized) ? ?Unsteadiness on feet ? ?Other abnormalities of gait and mobility ? ?PERTINENT HISTORY:  ?anxiety, Gilbert's syndrome, obesity, HLD.  ? ?PRECAUTIONS: Fall  ? ?SUBJECTIVE: Patient reports feels as she is finally getting used to the brace. No other new changes, than had that one fall due to the low BP.  ? ?PAIN:  ?Are you having pain? No ? ? ?TODAY'S TREATMENT:  ?PT providing information regarding [employer] [facility] [employer]  PT [facility]. Patient verbalize understanding and interested in participation, therefore PT providing handout with information.  ? ?STAIRS: ? Level of Assistance: SBA and CGA ? Stair Negotiation Technique: Step to Pattern with Single Rail on Right ? Number of Stairs: 12  ? Height of Stairs: 6  ?Comments: patient ascending/descend with proper sequencing, cues to start to incorporate ascending with RLE for improved strengthening/clearance ? ?GAIT: ?Distance walked: 600 ft (outdoors on unlevel surfaces);  plus additional distances in clinic with RW ?Assistive device utilized: Environmental consultant - 2 wheeled ?Level of assistance: Modified independence ?Comments: with AFO donned ?Gait Speed: 1.82 ft/sec ? ?Outcome Measures:  ? 5x sit <> stand test: 10.28 secs without UE support ? ? [facility] PT Assessment - 06/16/21 0001   ? ?  ? Standardized Balance Assessment  ? Standardized Balance Assessment Berg Balance Test   ?  ? Berg Balance Test  ? Sit to Stand Able to stand without using hands and stabilize independently   ? Standing Unsupported Able to stand safely 2 minutes   ? Sitting with Back Unsupported but Feet Supported on Floor or Stool Able to sit safely and securely 2 minutes   ? Stand to Sit Sits safely with minimal use of hands   ? Transfers Able to transfer safely, definite need of hands   ? Standing Unsupported with Eyes Closed Able to stand 10 seconds safely   ? Standing Unsupported with Feet Together Able to place feet together independently and stand 1 minute safely   ? From Standing, Reach Forward with Outstretched Arm Can reach forward >12 cm safely (5")   ? From Standing Position, Pick up Object from [place] to pick up shoe safely and easily   ? From Standing Position, Turn to Look Behind Over each Shoulder Looks behind from both sides and weight shifts well   ? Turn 360 Degrees Able to turn 360 degrees safely one side only in 4 seconds or less   ? Standing Unsupported, Alternately Place Feet on Step/Stool Able to complete 4 steps without aid or supervision   ? Standing Unsupported, One Foot in Front Able to take small step independently and hold 30 seconds   ? Standing on One Leg Able to lift leg independently and hold equal to or more than 3 seconds   ? Total Score 47   ? Merrilee Jansky comment: 47/56   ? ?  ?  ? ?  ? ?TherEx:  ?Reviewed patient's land HEP addressing any questions/concerns. Pt reports independence with Updated HEP.  ?Access Code: [email or web address] ?URL: [email or web address] ?Date: 06/16/2021 ?Prepared by:  Baldomero Lamy ? ?Exercises ?- Supine Bridge with Mini Swiss Ball Between Knees  - 2 x daily - 5 x weekly - 1-2 sets - 10 reps ?- Seated Hip Adduction Isometrics with Ball  - 2 x daily - 5 x weekly - 1-2 sets - 10 reps ?- Bent Knee Fallouts (Mirrored)  - 2 x daily - 5 x weekly - 1-2 sets - 10 reps ?- Supine Hip Flexion (Mirrored)  - 2 x daily - 5 x weekly - 1-2 sets - 10 reps ?- Sit to Stand with Armchair  - 2 x daily - 5 x weekly - 1 sets - 10 reps ?- Mini Squat with Counter Support  - 2 x daily - 5 x weekly - 1-2 sets - 10 reps ?- Single Leg Balance with Clock Reach  - 1 x daily - 5 x weekly - 1-2 sets - 10 reps ?-  Step Taps on High Step  - 1 x daily - 5 x weekly - 1-2 sets - 10 reps ?- Forward Step Up  - 1 x daily - 5 x weekly - 1-2 sets - 10 reps ? ?PATIENT EDUCATION: ?Education details:  Progress toward LTGs; [facility] for Additional PT services; HEP compliance ?Person educated: Patient  ?Education method: Explanation,  ?Education comprehension: verbalized understanding ? ? ?HOME EXERCISE PROGRAM: ?[id number] and [id number] (POOL HEP) ? ? PT Short Term Goals - 05/19/21 0914   ? ?  ? PT SHORT TERM GOAL #1  ? Title Pt will be independent with progressive HEP in order to build upon functional gains made in therapy (ALL STGs Due: 05/12/21)   ? Baseline pt reports compliance with HEP at home.   ? Time 4   ? Period Weeks   ? Status Achieved   ? Target Date 05/12/21   ?  ? PT SHORT TERM GOAL #2  ? Title Patient will improve Berg Balance to >/= 43/56 to demo improved balance and reduced fall risk   ? Baseline 36/56 initial assessment; 41/56; 45/56 on 05/19/21   ? Time 4   ? Period Weeks   ? Status Achieved   ?  ? PT SHORT TERM GOAL #3  ? Title Pt will improve gait speed to at least 1.85 ft/sec in order to demo improved household mobility.   ? Baseline .90 ft/sec with RW; 1.73 ft/sec; 1.66 ft/sec on 05/19/21   ? Time 4   ? Period Weeks   ? Status Not Met   ?  ? PT SHORT TERM GOAL #4  ? Title Pt will ambulate at  least >/= 600 ft with RW and supervision in order to demo improved household mobility.   ? Baseline not formally assessed during session, however pt able to ambulate with supervision.   ? Time 4   ? P

## 2021-06-20 ENCOUNTER — Ambulatory Visit: Payer: 59

## 2021-06-27 ENCOUNTER — Ambulatory Visit: Payer: 59 | Admitting: Physical Therapy

## 2021-07-05 ENCOUNTER — Ambulatory Visit: Payer: 59

## 2021-07-11 ENCOUNTER — Ambulatory Visit: Payer: 59 | Admitting: Physical Therapy

## 2021-07-20 ENCOUNTER — Encounter: Payer: 59 | Attending: Physical Medicine and Rehabilitation | Admitting: Physical Medicine and Rehabilitation

## 2021-07-20 ENCOUNTER — Encounter: Payer: Self-pay | Admitting: Physical Medicine and Rehabilitation

## 2021-07-20 VITALS — BP 134/91 | HR 98 | Ht 67.0 in | Wt 220.0 lb

## 2021-07-20 DIAGNOSIS — G8110 Spastic hemiplegia affecting unspecified side: Secondary | ICD-10-CM | POA: Insufficient documentation

## 2021-07-20 DIAGNOSIS — I612 Nontraumatic intracerebral hemorrhage in hemisphere, unspecified: Secondary | ICD-10-CM | POA: Diagnosis present

## 2021-07-20 DIAGNOSIS — G8191 Hemiplegia, unspecified affecting right dominant side: Secondary | ICD-10-CM | POA: Insufficient documentation

## 2021-07-20 NOTE — Progress Notes (Signed)
? ?Subjective:  ? ? Patient ID: Felicia Chen, female    DOB: [birth year], 56 y.o.   MRN: [id number] ? ?HPI ?Pt is a 56 yr old female with hx of R hemiplegia due to stroke in 10/22- secondary to L frontal ICH and small SAH- with associated spasticity and post STROKE hypotension- was on Florinef and Midodrine in hospital. Off Florinef and prn midodrine.  ?Here for f/u on spasticity and stroke.  ? ?Doing slightly better.  ?Finished therapy at [facility] ?Doing a program with [employer] [facility] class- catapulted into a whole new level. 3 PhD students working with her every time- really pushed her.  ?3 more visits with them; then going  to [facility] starting 07/29/21.  ? ?Goals- to get on a cane- on quad cane, not full time- but walking with some.  ? ?Also doing pool (did Aquatic therapy with Letta Moynahan)-  ?Mother has pool at home, so going to use this summer.  ? ?Had CT scan with Neurology- but hasn't been told if could restart SSRI.  ? ?Using carbon Fiber AFO on RLE.  ? ?Anxiety doing OK- working with Psych NP-  ?Put on gabapentin-  ?Feels like constantly will hurt someone, or cheated on test ?Taking Vistaril prn for anxiety. ?Takes every 2 weeks or so.  ?Also uses Lavender for aromatherapy which helps as well.  ? ?Misses Zoloft, but doing better.  ?Did go off Amantadine-  no side effects when came off of it.  ? ?Didn't get Botox- because was concerned about weakness.  ? ?Issue still having- R ankle not strong enough to drive.  ? ? ?Doesn't really having spasms- but muscles tight after sitting awhile- still taking Baclofen 1 pill nightly 10 mg.  ? ?Kicking PRAFO off at night- and falling off her. And sleeping on side sometimes.  ? ? ? ? ? ?Pain Inventory ?Average Pain 3 ?Pain Right Now 2 ?My pain is aching ? ?LOCATION OF PAIN  hip, back ? ?BOWEL ?Number of stools per week: 6 ?Oral laxative use No  ?Type of laxative na ?Enema or suppository use No  ?History of colostomy No  ?Incontinent No  ? ?BLADDER ?Normal ?In  and out cath, frequency . ?Able to self cath No  ?Bladder incontinence No  ?Frequent urination No  ?Leakage with coughing No  ?Difficulty starting stream No  ?Incomplete bladder emptying No  ? ? ?Mobility ?use a cane ?how many minutes can you walk? 15 ?ability to climb steps?  yes ?do you drive?  no ? ?Function ?not employed: date last employed . ?I need assistance with the following:  bathing, meal prep, household duties, and shopping ? ?Neuro/Psych ?trouble walking ?anxiety ? ?Prior Studies ?Any changes since last visit?  no ? ?Physicians involved in your care ?Any changes since last visit?  yes ? ? ?No family history on file. ?Social History  ? ?Socioeconomic History  ? Marital status: Married  ?  Spouse name: Not on file  ? Number of children: 5  ? Years of education: Not on file  ? Highest education level: Not on file  ?Occupational History  ? Not on file  ?Tobacco Use  ? Smoking status: Never  ? Smokeless tobacco: Never  ?Vaping Use  ? Vaping Use: Never used  ?Substance and Sexual Activity  ? Alcohol use: Yes  ?  Comment: rarely  ? Drug use: No  ? Sexual activity: Yes  ?  Partners: Male  ?Other Topics Concern  ? Not on  file  ?Social History Narrative  ? Not on file  ? ?Social Determinants of Health  ? ?Financial Resource Strain: Not on file  ?Food Insecurity: Not on file  ?Transportation Needs: Not on file  ?Physical Activity: Not on file  ?Stress: Not on file  ?Social Connections: Not on file  ? ?Past Surgical History:  ?Procedure Laterality Date  ? ABDOMINAL HYSTERECTOMY N/A 06/11/2012  ? Procedure: HYSTERECTOMY ABDOMINAL;  Surgeon: Allena Katz, MD;  Location: [facility];  Service: Gynecology;  Laterality: N/A;  ? CESAREAN SECTION    ? x 4  ? CYSTOSCOPY N/A 06/11/2012  ? Procedure: CYSTOSCOPY;  Surgeon: Allena Katz, MD;  Location: [facility];  Service: Gynecology;  Laterality: N/A;  ? HERNIA REPAIR    ? LAPAROSCOPIC ASSISTED VAGINAL HYSTERECTOMY N/A 06/11/2012  ? Procedure: LAPAROSCOPIC ASSISTED VAGINAL  HYSTERECTOMY atttempted;  Surgeon: Allena Katz, MD;  Location: [facility];  Service: Gynecology;  Laterality: N/A;  Attempted Laparoscopic assisted vaginal hysterectomy.  ? LAPAROSCOPIC LYSIS OF ADHESIONS N/A 06/11/2012  ? Procedure: LAPAROSCOPIC LYSIS OF ADHESIONS;  Surgeon: Allena Katz, MD;  Location: [facility];  Service: Gynecology;  Laterality: N/A;  ? SALPINGOOPHORECTOMY Bilateral 06/11/2012  ? Procedure: SALPINGO OOPHORECTOMY;  Surgeon: Allena Katz, MD;  Location: [facility];  Service: Gynecology;  Laterality: Bilateral;  ? ?Past Medical History:  ?Diagnosis Date  ? Anxiety   ? Gilbert's syndrome   ? ?BP (!) 134/91   Pulse 98   Ht '5\' 7"'$  (1.702 m)   Wt 220 lb (99.8 kg)   SpO2 96%   BMI 34.46 kg/m?  ? ?Opioid Risk Score:   ?Fall Risk Score:  `1 ? ?Depression screen PHQ 2/9 ? ? ?  04/29/2021  ? 11:49 AM 01/24/2021  ? 12:03 PM  ?Depression screen PHQ 2/9  ?Decreased Interest 3 0  ?Down, Depressed, Hopeless 3 2  ?PHQ - 2 Score 6 2  ?Altered sleeping  0  ?Tired, decreased energy  3  ?Change in appetite  3  ?Feeling bad or failure about yourself   0  ?Trouble concentrating  0  ?Moving slowly or fidgety/restless  0  ?Suicidal thoughts  0  ?PHQ-9 Score  8  ?  ? ?Review of Systems  ?Musculoskeletal:  Positive for back pain.  ?     Hip pain  ?All other systems reviewed and are negative. ? ?   ?Objective:  ? Physical Exam ? ?Awake, alert, walked in with quad cane- slow gait, but doing MUCH better; accompanied by husband, NAD ?Less anxious than last visit but voice vibrating some ?RUE- 5-/5 in biceps, triceps, WE, grip and FA ?RLE- HF/KE/KF 4/5; DF/PF 2/5 at max- but wearing R AFO.  ?L side 5/5 ?More ROM in R ankle- ~ 100 degrees DF in R ankle-  ? ?Neuro: ?MAS of 1+ to 2 in R elbow- and 1+ in R wrist and 1+ in R shoulder esp external rotation.  ?Hoffman's (+) RUE ?Few beats clonus RLE-wearing R AFO ?MAS of R knee 1+ to 2- ankle stiff due to AFO/tone- MAS of 2 and 1+ in R hip ?   ?Assessment & Plan:  ? ?Pt is a  56 yr old female with hx of R hemiplegia due to stroke in 10/22- secondary to L frontal ICH and small SAH- with associated spasticity and post STROKE hypotension- was on Florinef and Midodrine in hospital. Off Florinef and prn midodrine.  ?Here for f/u on spasticity and stroke.  ? ?  We discussed using a left accelerator- and also look into L brake and accelerator- google L driving pedal.  ? ?2.  Con't Baclofen 10 mg QHS-  ? ?3. Can use [employer] to help with RLE ? ?4. CANNOT drive with RLE at this time, or in foreseeable future.  ? ?5. Is gaining ROM in R ankle. But suggest plantar fasciitis - will help with toe curling and ankle ROM when asleep at night. Just wear to sleep in. ? ?6. Wants to wait on botox, which is reasonable.  ? ?7. Spasticity gets worse for 1-2 years- cuve slows down around 6-9 months, so I think we will be able to avoid Botox at this time.  ? ?8.  Suggest recumbent bike, not regular stationary bike.  ? ?9. Con't therapy per [place] ? ?10. F/U in 3 months-  ? ?11. Check again on SSRI's ? ? ?I spent a total of  31  minutes on total care today- >50% coordination of care- due to  education on spasticity- and d/w about driving- will need L driving pedal.  ?Also discussed daughter graduation- and how to use W/C to get into graduation.  ?Trip would be exhausting to go to [place] state.  ?Take the driving cart at [employer], etc- or take w/c to spend energy at her fun place, not getting to [place].  ? ? ?

## 2021-07-20 NOTE — Patient Instructions (Addendum)
Pt is a 56 yr old female with hx of R hemiplegia due to stroke in 10/22- secondary to L frontal ICH and small SAH- with associated spasticity and post STROKE hypotension- was on Florinef and Midodrine in hospital. Off Florinef and prn midodrine.  ?Here for f/u on spasticity and stroke.  ? ?We discussed using a left accelerator- and also look into L brake and accelerator- google L driving pedal.  ? ?2.  Con't Baclofen 10 mg QHS-  ? ?3. Can use [employer] [place] to help with RLE ? ?4. CANNOT drive with RLE at this time, or in foreseeable future.  ? ?5. Is gaining ROM in R ankle. But suggest plantar fasciitis - will help with toe curling and ankle ROM when asleep at night. Just wear to sleep in. ? ?6. Wants to wait on botox, which is reasonable.  ? ?7. Spasticity gets worse for 1-2 years- curve slows down around 6-9 months, so I think we will be able to avoid Botox at this time.  ? ?8.  Suggest recumbent bike, not regular stationary bike.  ? ?9. Con't therapy per [place] ? ?10. F/U in 3 months-  check on SSRI's as well. Take the driving cart at [employer], etc- or take w/c to spend energy at her fun place, not getting to [place].  ?

## 2021-09-15 ENCOUNTER — Encounter: Payer: 59 | Admitting: Psychology

## 2021-10-21 ENCOUNTER — Encounter: Payer: 59 | Attending: Physical Medicine and Rehabilitation | Admitting: Physical Medicine and Rehabilitation

## 2021-10-21 ENCOUNTER — Encounter: Payer: Self-pay | Admitting: Physical Medicine and Rehabilitation

## 2021-10-21 VITALS — BP 128/85 | HR 92 | Ht 67.0 in | Wt 224.4 lb

## 2021-10-21 DIAGNOSIS — G8191 Hemiplegia, unspecified affecting right dominant side: Secondary | ICD-10-CM | POA: Diagnosis present

## 2021-10-21 DIAGNOSIS — I69398 Other sequelae of cerebral infarction: Secondary | ICD-10-CM | POA: Diagnosis present

## 2021-10-21 DIAGNOSIS — I612 Nontraumatic intracerebral hemorrhage in hemisphere, unspecified: Secondary | ICD-10-CM

## 2021-10-21 DIAGNOSIS — M6281 Muscle weakness (generalized): Secondary | ICD-10-CM | POA: Insufficient documentation

## 2021-10-21 DIAGNOSIS — R252 Cramp and spasm: Secondary | ICD-10-CM | POA: Diagnosis present

## 2021-10-21 DIAGNOSIS — R269 Unspecified abnormalities of gait and mobility: Secondary | ICD-10-CM
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
## 2021-12-06 ENCOUNTER — Ambulatory Visit: Payer: 59 | Admitting: Physical Therapy

## 2021-12-09 ENCOUNTER — Ambulatory Visit: Payer: 59 | Attending: Physical Medicine and Rehabilitation | Admitting: Physical Therapy

## 2021-12-09 ENCOUNTER — Ambulatory Visit: Payer: 59

## 2021-12-09 DIAGNOSIS — R252 Cramp and spasm: Secondary | ICD-10-CM | POA: Diagnosis not present

## 2021-12-09 DIAGNOSIS — R2689 Other abnormalities of gait and mobility: Secondary | ICD-10-CM | POA: Insufficient documentation

## 2021-12-09 DIAGNOSIS — I612 Nontraumatic intracerebral hemorrhage in hemisphere, unspecified: Secondary | ICD-10-CM | POA: Insufficient documentation

## 2021-12-09 DIAGNOSIS — R29818 Other symptoms and signs involving the nervous system: Secondary | ICD-10-CM | POA: Insufficient documentation

## 2021-12-09 DIAGNOSIS — M6281 Muscle weakness (generalized): Secondary | ICD-10-CM | POA: Diagnosis present

## 2021-12-09 DIAGNOSIS — R269 Unspecified abnormalities of gait and mobility: Secondary | ICD-10-CM | POA: Diagnosis not present

## 2021-12-09 DIAGNOSIS — I69398 Other sequelae of cerebral infarction: Secondary | ICD-10-CM | POA: Diagnosis not present

## 2021-12-09 DIAGNOSIS — R2681 Unsteadiness on feet: Secondary | ICD-10-CM | POA: Diagnosis present

## 2021-12-09 DIAGNOSIS — I69351 Hemiplegia and hemiparesis following cerebral infarction affecting right dominant side: Secondary | ICD-10-CM | POA: Insufficient documentation

## 2021-12-09 DIAGNOSIS — R278 Other lack of coordination: Secondary | ICD-10-CM | POA: Diagnosis present

## 2021-12-09 NOTE — Therapy (Signed)
OUTPATIENT PHYSICAL THERAPY NEURO EVALUATION   Patient Name: LOLITHA TORTORA MRN: [id number] DOB:[birth year], 56 y.o., female Today's Date: 12/09/2021   PCP: Aretta Nip, MD REFERRING PROVIDER: Courtney Heys, MD   PT End of Session - 12/09/21 1500     Visit Number 1    Number of Visits 21    Date for PT Re-Evaluation 03/09/22    Authorization Type UHC, [id number], 66 USED    Authorization - Visit Number 0    Authorization - Number of Visits 20    PT Start Time 6295    PT Stop Time 1404    PT Time Calculation (min) 46 min    Equipment Utilized During Treatment Gait belt    Activity Tolerance Patient tolerated treatment well    Behavior During Therapy WFL for tasks assessed/performed             Past Medical History:  Diagnosis Date   Anxiety    Gilbert's syndrome    Past Surgical History:  Procedure Laterality Date   ABDOMINAL HYSTERECTOMY N/A 06/11/2012   Procedure: HYSTERECTOMY ABDOMINAL;  Surgeon: Allena Katz, MD;  Location: [facility];  Service: Gynecology;  Laterality: N/A;   CESAREAN SECTION     x 4   CYSTOSCOPY N/A 06/11/2012   Procedure: CYSTOSCOPY;  Surgeon: Allena Katz, MD;  Location: [facility];  Service: Gynecology;  Laterality: N/A;   HERNIA REPAIR     LAPAROSCOPIC ASSISTED VAGINAL HYSTERECTOMY N/A 06/11/2012   Procedure: LAPAROSCOPIC ASSISTED VAGINAL HYSTERECTOMY atttempted;  Surgeon: Allena Katz, MD;  Location: [facility];  Service: Gynecology;  Laterality: N/A;  Attempted Laparoscopic assisted vaginal hysterectomy.   LAPAROSCOPIC LYSIS OF ADHESIONS N/A 06/11/2012   Procedure: LAPAROSCOPIC LYSIS OF ADHESIONS;  Surgeon: Allena Katz, MD;  Location: [facility];  Service: Gynecology;  Laterality: N/A;   SALPINGOOPHORECTOMY Bilateral 06/11/2012   Procedure: SALPINGO OOPHORECTOMY;  Surgeon: Allena Katz, MD;  Location: [facility];  Service: Gynecology;  Laterality: Bilateral;   Patient Active Problem List   Diagnosis Date Noted    Spasticity as late effect of cerebrovascular accident (CVA) 10/21/2021   Abnormal gait due to muscle weakness 10/21/2021   Nausea without vomiting    Spastic hemiparesis (HCC)    History of hypertension    Neurogenic orthostatic hypotension (HCC)    Transaminitis    Right hemiplegia (HCC)    Hemorrhagic stroke (HCC)    Leucocytosis    Essential hypertension    Dyslipidemia    Right hemiparesis (HCC)    ICH (intracerebral hemorrhage) ([place]) 12/01/2020   HYPERLIPIDEMIA 07/06/2009   OBESITY 07/06/2009    ONSET DATE: 10/21/2021  REFERRING DIAG: I61.2 (ICD-10-CM) - Nontraumatic hemorrhage of left cerebral hemisphere (HCC) I69.398,R25.2 (ICD-10-CM) - Spasticity as late effect of cerebrovascular accident (CVA) M62.81,R26.9 (ICD-10-CM) - Abnormal gait due to muscle weakness   THERAPY DIAG:  Hemiplegia and hemiparesis following cerebral infarction affecting right dominant side (HCC)  Muscle weakness (generalized)  Unsteadiness on feet  Other abnormalities of gait and mobility  Other symptoms and signs involving the nervous system  Other lack of coordination  Rationale for Evaluation and Treatment Rehabilitation  SUBJECTIVE:  SUBJECTIVE STATEMENT: Went to [facility] and was walking with a cane afterwards. When going to [place] and was using the cane [facility]) afterwards and only using the RW at night to use the bathroom. Over the summer, went in the pool 5-6 days a week. Did a lot of walking in the pool and did her exercises. No longer wearing the AFO. Wants to improve her confidence to walk and wants to work on her hamstring and ankle strengthening. Has worked on walking on a treadmill at home with daughter supervision. Can go up and down the stairs with no issues. Has had no  falls.  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   PAIN:  Are you having pain? No  PRECAUTIONS: Fall  FALLS: Has patient fallen in last 6 months? No  LIVING ENVIRONMENT: Lives with: lives with their family and lives with their spouse Lives in: House/apartment Stairs: Yes: Internal: 20 steps; on right going up and on left going up Has following equipment at home: Quad cane small base  PLOF: Independent with basic ADLs, Independent with household mobility with device, and Independent with community mobility with device  PATIENT GOALS Wants to walk more confidently, wants to return to driving   OBJECTIVE:   COGNITION: Overall cognitive status: Within functional limits for tasks assessed   SENSATION: Light touch: Impaired  Proprioception: Impaired  Reports slightly different on RLE.   COORDINATION: Heel to shin: impaired RLE due to hemiparesis    MUSCLE TONE: RLE: Hypertonic   POSTURE: rounded shoulders and forward head  LOWER EXTREMITY ROM:    Able to reach R ankle DF neutral with PROM   Limited RLE AROM due to weakness.   LOWER EXTREMITY MMT:    MMT Right Eval Left Eval  Hip flexion 4+/5 5/5  Hip extension    Hip abduction 5/5 5/5  Hip adduction 5/5 5/5  Hip internal rotation 3/5   Hip external rotation 4+/5   Knee flexion 3+/5  5/5  Knee extension 4-/5 (compensates with external rotation) 5/5  Ankle dorsiflexion 3+/5 5/5  Ankle plantarflexion    Ankle inversion    Ankle eversion    (Blank rows = not tested)  Hip ABD/ADD tested in sitting.   TRANSFERS: Assistive device utilized: None  Sit to stand: SBA Stand to sit: SBA  Performed with and without UE support, pt tends to have LLE staggered posteriorly when not focusing on foot position.    GAIT: Gait pattern: step through pattern, decreased arm swing- Right, decreased step length-  Left, decreased stance time- Right, decreased hip/knee flexion- Right, decreased ankle dorsiflexion- Right, circumduction- Right, Right hip hike, Right foot flat, decreased trunk rotation, and poor foot clearance- Right Distance walked: Clinic distances  Assistive device utilized: Quad cane small base Level of assistance: SBA Comments: Pt not wearing an AFO and has not been using it anymore! Pt wearing new shoes, so toe would almost get caught on floor at times. Discussed adding a leather toe cap to new sneakers. Pt asking about a foot up brace (had one before) for foot clearance but pt not sure that they are putting it in her shoes correctly. Discussed bringing it into a future session and it may be beneficial for longer distances.   FUNCTIONAL TESTs:  5 times sit to stand: 13.5 seconds  30 seconds chair stand test: 11 sit <>  stands without UE support, performed with LLE posteriorly and incr weight through LLE.  10 meter walk test: 21.22 seconds = 1.54 ft/sec with SBQC    M-CTSIB  Condition 1: Firm Surface, EO 30 Sec, Normal Sway  Condition 2: Firm Surface, EC 30 Sec, Normal Sway  Condition 3: Foam Surface, EO 30 Sec, Normal Sway  Condition 4: Foam Surface, EC 23 Sec, Mild Sway     [facility] PT Assessment - 12/09/21 1507       Standardized Balance Assessment   Standardized Balance Assessment Timed Up and Go Test      Timed Up and Go Test   Normal TUG (seconds) 20.15   with SBQC, 23.28 seconds with no AD with min guard             TODAY'S TREATMENT:  None during eval.   PATIENT EDUCATION: Education details: Clinical findings, POC and visit limit (pt has 20 more visits to use for the calendar year), areas to work on in therapy and using Bioness with gait/strengthening, putting a leather toe cap on new shoes on R side for improved foot clearance.   Person educated: Patient and Child(ren) - pt's daughter Education method: Explanation Education comprehension: verbalized  understanding   HOME EXERCISE PROGRAM: [id number] (from previous bout of therapy, will update/revise as needed)    GOALS: Goals reviewed with patient? Yes  SHORT TERM GOALS: Target date: 01/06/2022  Pt will improve gait speed with LRAD to at least 1.80 ft/sec in order to demo decr fall risk Baseline:21.22 seconds = 1.54 ft/sec with SBQC Goal status: INITIAL  2.  Pt will improve condition 4 of mCTSIB to at least 30 seconds in order to demo improved vestibular input for balance.  Baseline: 23 seconds Goal status: INITIAL  3.  Pt will undergo further assessment of balance test (DGI) with LTG written. Baseline:  Goal status: INITIAL  4.  Pt will improve TUG time to 18 seconds or less with LRAD in order to demo decrease fall risk. Baseline: 20.15 seconds with SBQC Goal status: INITIAL  5.  Pt will improve 30 second chair stand to at least 12 sit <> stands in order to demo improved functional strength with no UE support  Baseline: 11 sit <> stands Goal status: INITIAL    LONG TERM GOALS: Target date: 02/03/2022  Pt will be independent with final HEP in order to build upon functional gains made in therapy. Baseline:  Goal status: INITIAL  2.  DGI goal to be written as appropriate.  Baseline:  Goal status: INITIAL  3.  Pt will improve TUG time to 16.5 seconds or less with LRAD in order to demo decrease fall risk. Baseline: 20.15 seconds with SBQC Goal status: INITIAL  4.  Pt will ambulate outdoors at least 300' over unlevel surfaces with LRAD and supervision in order to demo improved community mobility Baseline:  Goal status: INITIAL  5.  Pt will improve gait speed with LRAD to at least 2.0 ft/sec in order to demo improved community mobility. Baseline:  Goal status: INITIAL  ASSESSMENT:  CLINICAL IMPRESSION: Patient is a 56 year old female referred to [facility] for hx of CVA (pt well known to this clinic).   Pt's PMH is significant for: hx of R hemiplegia due to  stroke in 10/22- secondary to L frontal ICH and small SAH, anxiety. Pt took a break from [place] for ~6 months due to her visit limit for insurance and went to free PT clinics at [facility]. Pt is now ambulating with a SBQC and no AFO. The following deficits were present during the exam: gait abnormalities, impaired sensation/proprioception, impaired balance, incr tone, postural abnormalities, decr strength, decr ROM. Based on TUG and gait speed, pt is an incr risk for falls. Pt would benefit from skilled PT to address these impairments and functional limitations to maximize functional mobility independence and decr fall risk.     OBJECTIVE IMPAIRMENTS Abnormal gait, decreased activity tolerance, decreased balance, decreased coordination, decreased endurance, decreased mobility, difficulty walking, decreased ROM, decreased strength, impaired flexibility, impaired sensation, impaired tone, and postural dysfunction.   ACTIVITY LIMITATIONS carrying, lifting, bending, and locomotion level  PARTICIPATION LIMITATIONS: driving, community activity, and yard work  PERSONAL FACTORS Behavior pattern, Past/current experiences, Time since onset of injury/illness/exacerbation, and 1-2 comorbidities: anxiety  are also affecting patient's functional outcome.   REHAB POTENTIAL: Good  CLINICAL DECISION MAKING: Stable/uncomplicated  EVALUATION COMPLEXITY: Low  PLAN: PT FREQUENCY: 1-2x/week  PT DURATION: 12 weeks  PLANNED INTERVENTIONS: Therapeutic exercises, Therapeutic activity, Neuromuscular re-education, Balance training, Gait training, Patient/Family education, Self Care, Joint mobilization, Stair training, Vestibular training, Orthotic/Fit training, DME instructions, Aquatic Therapy, Electrical stimulation, Manual therapy, and Re-evaluation  PLAN FOR NEXT SESSION: Get pt set up on Bioness for R ant tib/hamstring and work on Patent examiner. Work on RLE strengthening, balance, weight shifting to R. Trial  different canes. Treadmill training with harness. Finalize and update HEP.    Arliss Journey, PT, DPT  12/09/2021, 3:01 PM

## 2021-12-14 ENCOUNTER — Ambulatory Visit: Payer: 59 | Admitting: Physical Therapy

## 2021-12-16 ENCOUNTER — Ambulatory Visit: Payer: 59 | Admitting: Physical Therapy

## 2021-12-21 ENCOUNTER — Ambulatory Visit: Payer: 59 | Attending: Physical Medicine and Rehabilitation

## 2021-12-21 DIAGNOSIS — R2681 Unsteadiness on feet: Secondary | ICD-10-CM

## 2021-12-21 DIAGNOSIS — M6281 Muscle weakness (generalized): Secondary | ICD-10-CM

## 2021-12-21 DIAGNOSIS — R2689 Other abnormalities of gait and mobility: Secondary | ICD-10-CM

## 2021-12-21 DIAGNOSIS — R278 Other lack of coordination: Secondary | ICD-10-CM

## 2021-12-21 DIAGNOSIS — R29818 Other symptoms and signs involving the nervous system: Secondary | ICD-10-CM

## 2021-12-21 NOTE — Therapy (Signed)
OUTPATIENT PHYSICAL THERAPY TREATMENT NOTE   Patient Name: Felicia Chen MRN: [id number] DOB:05-11-1965, 56 y.o., female Today's Date: 12/21/2021  PCP: Aretta Nip, MD REFERRING PROVIDER: Courtney Heys, MD  END OF SESSION:   PT End of Session - 12/21/21 1233     Visit Number 2    Number of Visits 21    Date for PT Re-Evaluation 03/09/22    Authorization Type U[employer], VL:40, 20 USED    Authorization - Visit Number 0    Authorization - Number of Visits 20    PT Start Time 1234    PT Stop Time 1315    PT Time Calculation (min) 41 min    Equipment Utilized During Treatment Gait belt    Activity Tolerance Patient tolerated treatment well    Behavior During Therapy WFL for tasks assessed/performed;Anxious             Past Medical History:  Diagnosis Date   Anxiety    Gilbert's syndrome    Past Surgical History:  Procedure Laterality Date   ABDOMINAL HYSTERECTOMY N/A 06/11/2012   Procedure: HYSTERECTOMY ABDOMINAL;  Surgeon: Allena Katz, MD;  Location: [facility];  Service: Gynecology;  Laterality: N/A;   CESAREAN SECTION     x 4   CYSTOSCOPY N/A 06/11/2012   Procedure: CYSTOSCOPY;  Surgeon: Allena Katz, MD;  Location: [facility];  Service: Gynecology;  Laterality: N/A;   HERNIA REPAIR     LAPAROSCOPIC ASSISTED VAGINAL HYSTERECTOMY N/A 06/11/2012   Procedure: LAPAROSCOPIC ASSISTED VAGINAL HYSTERECTOMY atttempted;  Surgeon: Allena Katz, MD;  Location: [facility];  Service: Gynecology;  Laterality: N/A;  Attempted Laparoscopic assisted vaginal hysterectomy.   LAPAROSCOPIC LYSIS OF ADHESIONS N/A 06/11/2012   Procedure: LAPAROSCOPIC LYSIS OF ADHESIONS;  Surgeon: Allena Katz, MD;  Location: [facility];  Service: Gynecology;  Laterality: N/A;   SALPINGOOPHORECTOMY Bilateral 06/11/2012   Procedure: SALPINGO OOPHORECTOMY;  Surgeon: Allena Katz, MD;  Location: [facility];  Service: Gynecology;  Laterality: Bilateral;   Patient Active Problem List   Diagnosis  Date Noted   Spasticity as late effect of cerebrovascular accident (CVA) 10/21/2021   Abnormal gait due to muscle weakness 10/21/2021   Nausea without vomiting    Spastic hemiparesis (HCC)    History of hypertension    Neurogenic orthostatic hypotension (HCC)    Transaminitis    Right hemiplegia (HCC)    Hemorrhagic stroke (HCC)    Leucocytosis    Essential hypertension    Dyslipidemia    Right hemiparesis (HCC)    ICH (intracerebral hemorrhage) ([place]) 12/01/2020   HYPERLIPIDEMIA 07/06/2009   OBESITY 07/06/2009    REFERRING DIAG: I61.2 (ICD-10-CM) - Nontraumatic hemorrhage of left cerebral hemisphere (HCC) I69.398,R25.2 (ICD-10-CM) - Spasticity as late effect of cerebrovascular accident (CVA) M62.81,R26.9 (ICD-10-  THERAPY DIAG:  Muscle weakness (generalized)  Unsteadiness on feet  Other abnormalities of gait and mobility  Other symptoms and signs involving the nervous system  Other lack of coordination  Rationale for Evaluation and Treatment Rehabilitation  PERTINENT HISTORY: hx of R hemiplegia due to stroke in 10/22- secondary to L frontal ICH and small SAH, anxiety  PRECAUTIONS: fall, R hemi  SUBJECTIVE: Patient reports doing well. Wants to focus on R ankle and hamstring strength.   PAIN:  Are you having pain? No   TODAY'S TREATMENT:  NMR: -NMES to R anterior tib  -carryover to R anterior step onto 4" box with CGA + cone to help minimize circumduction   -  progressed to adding 3# ankle weight for improved proprioceptive input      PATIENT EDUCATION: Education details: use of NMES Person educated: Patient and Child(ren) - pt's daughter Education method: Explanation Education comprehension: verbalized understanding     HOME EXERCISE PROGRAM: [id number] (from previous bout of therapy, will update/revise as needed)       GOALS: Goals reviewed with patient? Yes   SHORT TERM GOALS: Target date: 01/06/2022   Pt will improve gait speed with LRAD to at least  1.80 ft/sec in order to demo decr fall risk Baseline:21.22 seconds = 1.54 ft/sec with SBQC Goal status: INITIAL   2.  Pt will improve condition 4 of mCTSIB to at least 30 seconds in order to demo improved vestibular input for balance.  Baseline: 23 seconds Goal status: INITIAL   3.  Pt will undergo further assessment of balance test (DGI) with LTG written. Baseline:  Goal status: INITIAL   4.  Pt will improve TUG time to 18 seconds or less with LRAD in order to demo decrease fall risk. Baseline: 20.15 seconds with SBQC Goal status: INITIAL   5.  Pt will improve 30 second chair stand to at least 12 sit <> stands in order to demo improved functional strength with no UE support  Baseline: 11 sit <> stands Goal status: INITIAL       LONG TERM GOALS: Target date: 02/03/2022   Pt will be independent with final HEP in order to build upon functional gains made in therapy. Baseline:  Goal status: INITIAL   2.  DGI goal to be written as appropriate.  Baseline:  Goal status: INITIAL   3.  Pt will improve TUG time to 16.5 seconds or less with LRAD in order to demo decrease fall risk. Baseline: 20.15 seconds with SBQC Goal status: INITIAL   4.  Pt will ambulate outdoors at least 300' over unlevel surfaces with LRAD and supervision in order to demo improved community mobility Baseline:  Goal status: INITIAL   5.  Pt will improve gait speed with LRAD to at least 2.0 ft/sec in order to demo improved community mobility. Baseline:  Goal status: INITIAL   ASSESSMENT:   CLINICAL IMPRESSION: Patient seen for skilled PT session with emphasis on R LE NMR and pregait training. Patient with spastic gait pattern noted walking into clinic with R LE circumduction, eversion, increased R knee flexion in stance and lateral hip instability. Good response noted to NMES with AAROM. Noted to have some toe clawing to different textures applied to bottom of the foot and would benefit from general  desensitization- discussed with patient and daughter and they verbalized understanding. Patient improving overall pregait pattern onto 4" step with mirror used for visual feedback on gait pattern. Continue POC.    OBJECTIVE IMPAIRMENTS Abnormal gait, decreased activity tolerance, decreased balance, decreased coordination, decreased endurance, decreased mobility, difficulty walking, decreased ROM, decreased strength, impaired flexibility, impaired sensation, impaired tone, and postural dysfunction.    ACTIVITY LIMITATIONS carrying, lifting, bending, and locomotion level   PARTICIPATION LIMITATIONS: driving, community activity, and yard work   PERSONAL FACTORS Behavior pattern, Past/current experiences, Time since onset of injury/illness/exacerbation, and 1-2 comorbidities: anxiety  are also affecting patient's functional outcome.    REHAB POTENTIAL: Good   CLINICAL DECISION MAKING: Stable/uncomplicated   EVALUATION COMPLEXITY: Low   PLAN: PT FREQUENCY: 1-2x/week   PT DURATION: 12 weeks   PLANNED INTERVENTIONS: Therapeutic exercises, Therapeutic activity, Neuromuscular re-education, Balance training, Gait training, Patient/Family education, Self Care, Joint mobilization,  Stair training, Vestibular training, Orthotic/Fit training, DME instructions, Aquatic Therapy, Electrical stimulation, Manual therapy, and Re-evaluation   PLAN FOR NEXT SESSION: Get pt set up on Bioness for R ant tib/hamstring and work on Patent examiner. Work on RLE strengthening, balance, weight shifting to R. Trial different canes. Treadmill training with harness. Finalize and update HEP.   Debbora Dus, PT Debbora Dus, PT, DPT, CBIS  12/21/2021, 2:02 PM

## 2021-12-23 ENCOUNTER — Ambulatory Visit: Payer: 59 | Admitting: Physical Therapy

## 2021-12-23 ENCOUNTER — Encounter: Payer: Self-pay | Admitting: Physical Therapy

## 2021-12-23 DIAGNOSIS — R2689 Other abnormalities of gait and mobility: Secondary | ICD-10-CM

## 2021-12-23 DIAGNOSIS — R2681 Unsteadiness on feet: Secondary | ICD-10-CM

## 2021-12-23 DIAGNOSIS — M6281 Muscle weakness (generalized): Secondary | ICD-10-CM | POA: Diagnosis not present

## 2021-12-23 DIAGNOSIS — R29818 Other symptoms and signs involving the nervous system: Secondary | ICD-10-CM

## 2021-12-23 NOTE — Therapy (Signed)
OUTPATIENT PHYSICAL THERAPY TREATMENT NOTE   Patient Name: Felicia Chen MRN: [id number] DOB:Jul 28, 1965, 56 y.o., female Today's Date: 12/23/2021  PCP: Aretta Nip, MD REFERRING PROVIDER: Courtney Heys, MD  END OF SESSION:   PT End of Session - 12/23/21 1023     Visit Number 3    Number of Visits 21    Date for PT Re-Evaluation 03/09/22    Authorization Type UHC, VL:40, 20 USED    Authorization - Visit Number 2    Authorization - Number of Visits 20    PT Start Time 6789   pt late to session   PT Stop Time 1100    PT Time Calculation (min) 38 min    Equipment Utilized During Treatment Gait belt    Activity Tolerance Patient tolerated treatment well    Behavior During Therapy WFL for tasks assessed/performed;Anxious              Past Medical History:  Diagnosis Date   Anxiety    Gilbert's syndrome    Past Surgical History:  Procedure Laterality Date   ABDOMINAL HYSTERECTOMY N/A 06/11/2012   Procedure: HYSTERECTOMY ABDOMINAL;  Surgeon: Allena Katz, MD;  Location: [facility];  Service: Gynecology;  Laterality: N/A;   CESAREAN SECTION     x 4   CYSTOSCOPY N/A 06/11/2012   Procedure: CYSTOSCOPY;  Surgeon: Allena Katz, MD;  Location: [facility];  Service: Gynecology;  Laterality: N/A;   HERNIA REPAIR     LAPAROSCOPIC ASSISTED VAGINAL HYSTERECTOMY N/A 06/11/2012   Procedure: LAPAROSCOPIC ASSISTED VAGINAL HYSTERECTOMY atttempted;  Surgeon: Allena Katz, MD;  Location: [facility];  Service: Gynecology;  Laterality: N/A;  Attempted Laparoscopic assisted vaginal hysterectomy.   LAPAROSCOPIC LYSIS OF ADHESIONS N/A 06/11/2012   Procedure: LAPAROSCOPIC LYSIS OF ADHESIONS;  Surgeon: Allena Katz, MD;  Location: [facility];  Service: Gynecology;  Laterality: N/A;   SALPINGOOPHORECTOMY Bilateral 06/11/2012   Procedure: SALPINGO OOPHORECTOMY;  Surgeon: Allena Katz, MD;  Location: [facility];  Service: Gynecology;  Laterality: Bilateral;   Patient Active  Problem List   Diagnosis Date Noted   Spasticity as late effect of cerebrovascular accident (CVA) 10/21/2021   Abnormal gait due to muscle weakness 10/21/2021   Nausea without vomiting    Spastic hemiparesis (HCC)    History of hypertension    Neurogenic orthostatic hypotension (HCC)    Transaminitis    Right hemiplegia (HCC)    Hemorrhagic stroke (HCC)    Leucocytosis    Essential hypertension    Dyslipidemia    Right hemiparesis (HCC)    ICH (intracerebral hemorrhage) ([facility]) 12/01/2020   HYPERLIPIDEMIA 07/06/2009   OBESITY 07/06/2009    REFERRING DIAG: I61.2 (ICD-10-CM) - Nontraumatic hemorrhage of left cerebral hemisphere (HCC) I69.398,R25.2 (ICD-10-CM) - Spasticity as late effect of cerebrovascular accident (CVA) M62.81,R26.9 (ICD-10-  THERAPY DIAG:  Muscle weakness (generalized)  Unsteadiness on feet  Other abnormalities of gait and mobility  Other symptoms and signs involving the nervous system  Rationale for Evaluation and Treatment Rehabilitation  PERTINENT HISTORY: hx of R hemiplegia due to stroke in 10/22- secondary to L frontal ICH and small SAH, anxiety  PRECAUTIONS: fall, R hemi  SUBJECTIVE: Has been taking a Xanax this morning to help with anxiety due to starting a new medication.   PAIN:  Are you having pain? No "just generally sore"    TODAY'S TREATMENT:  NMR:  [facility] Adult PT Treatment/Exercise - 12/23/21 1203  Modalities   Modalities Teacher, English as a foreign language Location R Ant Tib - steering electrodes    Electrical Stimulation Action For strengthening, neuro re-ed, gait training    Electrical Stimulation Parameters See Tablet 1    Electrical Stimulation Goals Strength;Neuromuscular facilitation             Set pt up on Bioness to R anterior tib using steering electrodes, pt with tendency to have incr eversion. Unable to set up hamstring cuff today due to pt wearing long pants. Educated  on wearing shorts to next session .   GAIT: Gait pattern: step through pattern, decreased arm swing- Right, decreased step length- Left, decreased stance time- Right, decreased hip/knee flexion- Right, decreased ankle dorsiflexion- Right, circumduction- Right, Right hip hike, Right foot flat, decreased trunk rotation, and poor foot clearance- Right Distance walked: Clinic distances  Assistive device utilized: Quad cane small base Level of assistance: 345' x 1  Comments: concurrent with Bioness to R anterior tib for improved foot clearance. Cued to also have pt think about heel strike during gait. Pt still with decr heel strike, but overall still improved foot clearance compared with no E-Stim. Pt reporting feeling more steady during gait with use of E-Stim.    Concurrent with Bioness: Seated ankle DF on rockerboard, on time 5 seconds, off time 8 seconds, with cues to try to lift up foot from board, pt unable to lift up foot from board.  Staggered stance sit <> stands with RLE posteriorly for closed chain ankle DF strengthening, on time 8 seconds, off time 8 seconds, pt performing full sit to stand in 8 seconds   Upgraded bridge to HEP for bent to perform with RLE bent for incr RLE activation, and having LLE extended, discussed would need to have a family member hold onto her ankle to prevent it from sliding, performed 10 reps.  Pt verbalizing to therapist what she is performing as HEP currently (step ups, mini squats, sit to stands, seated/supine marching)   PATIENT EDUCATION: Education details: use of Bioness, continued use in future sessions  Person educated: Patient and Child(ren) - pt's daughter Education method: Explanation Education comprehension: verbalized understanding     HOME EXERCISE PROGRAM: [id number] (from previous bout of therapy, will update/revise as needed)       GOALS: Goals reviewed with patient? Yes   SHORT TERM GOALS: Target date: 01/06/2022   Pt will improve  gait speed with LRAD to at least 1.80 ft/sec in order to demo decr fall risk Baseline:21.22 seconds = 1.54 ft/sec with SBQC Goal status: INITIAL   2.  Pt will improve condition 4 of mCTSIB to at least 30 seconds in order to demo improved vestibular input for balance.  Baseline: 23 seconds Goal status: INITIAL   3.  Pt will undergo further assessment of balance test (DGI) with LTG written. Baseline:  Goal status: INITIAL   4.  Pt will improve TUG time to 18 seconds or less with LRAD in order to demo decrease fall risk. Baseline: 20.15 seconds with SBQC Goal status: INITIAL   5.  Pt will improve 30 second chair stand to at least 12 sit <> stands in order to demo improved functional strength with no UE support  Baseline: 11 sit <> stands Goal status: INITIAL       LONG TERM GOALS: Target date: 02/03/2022   Pt will be independent with final HEP in order to build upon functional gains made  in therapy. Baseline:  Goal status: INITIAL   2.  DGI goal to be written as appropriate.  Baseline:  Goal status: INITIAL   3.  Pt will improve TUG time to 16.5 seconds or less with LRAD in order to demo decrease fall risk. Baseline: 20.15 seconds with SBQC Goal status: INITIAL   4.  Pt will ambulate outdoors at least 300' over unlevel surfaces with LRAD and supervision in order to demo improved community mobility Baseline:  Goal status: INITIAL   5.  Pt will improve gait speed with LRAD to at least 2.0 ft/sec in order to demo improved community mobility. Baseline:  Goal status: INITIAL   ASSESSMENT:   CLINICAL IMPRESSION: Today's skilled session focused on getting pt set up for Bioness for R anterior tib for ankle DF strengthening and during gait. Unable to get hamstring cuff set up due to pt wearing pants. Pt with strong eversion response initially, but able to decr eversion with use of steering electrodes. Remainder of session focused on gait training and strengthening with use of  Bioness. Pt tolerated session well, will continue to progress towards LTGs.    OBJECTIVE IMPAIRMENTS Abnormal gait, decreased activity tolerance, decreased balance, decreased coordination, decreased endurance, decreased mobility, difficulty walking, decreased ROM, decreased strength, impaired flexibility, impaired sensation, impaired tone, and postural dysfunction.    ACTIVITY LIMITATIONS carrying, lifting, bending, and locomotion level   PARTICIPATION LIMITATIONS: driving, community activity, and yard work   PERSONAL FACTORS Behavior pattern, Past/current experiences, Time since onset of injury/illness/exacerbation, and 1-2 comorbidities: anxiety  are also affecting patient's functional outcome.    REHAB POTENTIAL: Good   CLINICAL DECISION MAKING: Stable/uncomplicated   EVALUATION COMPLEXITY: Low   PLAN: PT FREQUENCY: 1-2x/week   PT DURATION: 12 weeks   PLANNED INTERVENTIONS: Therapeutic exercises, Therapeutic activity, Neuromuscular re-education, Balance training, Gait training, Patient/Family education, Self Care, Joint mobilization, Stair training, Vestibular training, Orthotic/Fit training, DME instructions, Aquatic Therapy, Electrical stimulation, Manual therapy, and Re-evaluation   PLAN FOR NEXT SESSION: Get pt set up on Bioness for R ant tib/hamstring and work on Patent examiner. Work on RLE strengthening, balance, weight shifting to R. Trial different canes. Treadmill training with harness. Finalize and update HEP.   Arliss Journey, PT, DPTz 12/23/2021, 12:10 PM

## 2021-12-28 ENCOUNTER — Ambulatory Visit: Payer: 59 | Admitting: Physical Therapy

## 2021-12-28 ENCOUNTER — Encounter: Payer: Self-pay | Admitting: Physical Therapy

## 2021-12-28 DIAGNOSIS — R2681 Unsteadiness on feet: Secondary | ICD-10-CM

## 2021-12-28 DIAGNOSIS — M6281 Muscle weakness (generalized): Secondary | ICD-10-CM | POA: Diagnosis not present

## 2021-12-28 DIAGNOSIS — R29818 Other symptoms and signs involving the nervous system: Secondary | ICD-10-CM

## 2021-12-28 DIAGNOSIS — R2689 Other abnormalities of gait and mobility: Secondary | ICD-10-CM

## 2021-12-28 NOTE — Therapy (Signed)
OUTPATIENT PHYSICAL THERAPY TREATMENT NOTE   Patient Name: Felicia Chen MRN: [id number] DOB:[birth year], 56 y.o., female Today's Date: 12/28/2021  PCP: Aretta Nip, MD REFERRING PROVIDER: Courtney Heys, MD  END OF SESSION:   PT End of Session - 12/28/21 0939     Visit Number 4    Number of Visits 21    Date for PT Re-Evaluation 03/09/22    Authorization Type U[employer], VL:40, 20 USED    Authorization - Visit Number 2    Authorization - Number of Visits 20    PT Start Time [phone]   pt late   PT Stop Time 1016    PT Time Calculation (min) 42 min    Equipment Utilized During Treatment Gait belt    Activity Tolerance Patient tolerated treatment well    Behavior During Therapy WFL for tasks assessed/performed;Anxious              Past Medical History:  Diagnosis Date   Anxiety    Gilbert's syndrome    Past Surgical History:  Procedure Laterality Date   ABDOMINAL HYSTERECTOMY N/A 06/11/2012   Procedure: HYSTERECTOMY ABDOMINAL;  Surgeon: Allena Katz, MD;  Location: [facility];  Service: Gynecology;  Laterality: N/A;   CESAREAN SECTION     x 4   CYSTOSCOPY N/A 06/11/2012   Procedure: CYSTOSCOPY;  Surgeon: Allena Katz, MD;  Location: [facility];  Service: Gynecology;  Laterality: N/A;   HERNIA REPAIR     LAPAROSCOPIC ASSISTED VAGINAL HYSTERECTOMY N/A 06/11/2012   Procedure: LAPAROSCOPIC ASSISTED VAGINAL HYSTERECTOMY atttempted;  Surgeon: Allena Katz, MD;  Location: [facility];  Service: Gynecology;  Laterality: N/A;  Attempted Laparoscopic assisted vaginal hysterectomy.   LAPAROSCOPIC LYSIS OF ADHESIONS N/A 06/11/2012   Procedure: LAPAROSCOPIC LYSIS OF ADHESIONS;  Surgeon: Allena Katz, MD;  Location: [facility];  Service: Gynecology;  Laterality: N/A;   SALPINGOOPHORECTOMY Bilateral 06/11/2012   Procedure: SALPINGO OOPHORECTOMY;  Surgeon: Allena Katz, MD;  Location: [facility];  Service: Gynecology;  Laterality: Bilateral;   Patient Active Problem List    Diagnosis Date Noted   Spasticity as late effect of cerebrovascular accident (CVA) 10/21/2021   Abnormal gait due to muscle weakness 10/21/2021   Nausea without vomiting    Spastic hemiparesis (HCC)    History of hypertension    Neurogenic orthostatic hypotension (HCC)    Transaminitis    Right hemiplegia (HCC)    Hemorrhagic stroke (HCC)    Leucocytosis    Essential hypertension    Dyslipidemia    Right hemiparesis (HCC)    ICH (intracerebral hemorrhage) ([place]) 12/01/2020   HYPERLIPIDEMIA 07/06/2009   OBESITY 07/06/2009    REFERRING DIAG: I61.2 (ICD-10-CM) - Nontraumatic hemorrhage of left cerebral hemisphere (HCC) I69.398,R25.2 (ICD-10-CM) - Spasticity as late effect of cerebrovascular accident (CVA) M62.81,R26.9 (ICD-10-  THERAPY DIAG:  Muscle weakness (generalized)  Unsteadiness on feet  Other abnormalities of gait and mobility  Other symptoms and signs involving the nervous system  Rationale for Evaluation and Treatment Rehabilitation  PERTINENT HISTORY: hx of R hemiplegia due to stroke in 10/22- secondary to L frontal ICH and small SAH, anxiety  PRECAUTIONS: fall, R hemi  SUBJECTIVE:  Pt denies falls or acute issues at home.  She has tried the new addition to her HEP.  She has noticed her right toes are curling less and she is walking a bit in her home without her shoes on.  PAIN:  Are you having pain? No "just  generally sore"    TODAY'S TREATMENT:  -Prone eccentric hamstring lowers, pt has good contraction w/ therapist controlling lower into knee extension x6 > therapist preventing hip extension during prone hamstring curl to ~10-15 deg knee flexion x5 > returned to ecc lowers x5 w/ notable fatigue -Prone ER/IR attempted w/ pt able to ER, but compensating w/ adductors for IR > regressed to supine leg extended IR/ER x10 w/ 2 sec hold at end range, pt able to achieve full ROM no compensation for IR > attempted yellow band IR x4 w/ dec ROM > w/o band x10  -STS w/  2" step elevating LLE x6 > STS 2x8 LLE elevated on 4" step -In // bars, Adv/retreat over 2" hurdle w/ RLE in stance progressing from BUE to none, cues to prevent weight shift to left during limb advancement   PATIENT EDUCATION: Education details: use of Bioness, continued use in future sessions  Person educated: Patient and Child(ren) - pt's daughter Education method: Explanation Education comprehension: verbalized understanding     HOME EXERCISE PROGRAM: [id number] (from previous bout of therapy, will update/revise as needed)       GOALS: Goals reviewed with patient? Yes   SHORT TERM GOALS: Target date: 01/06/2022   Pt will improve gait speed with LRAD to at least 1.80 ft/sec in order to demo decr fall risk Baseline:21.22 seconds = 1.54 ft/sec with SBQC Goal status: INITIAL   2.  Pt will improve condition 4 of mCTSIB to at least 30 seconds in order to demo improved vestibular input for balance.  Baseline: 23 seconds Goal status: INITIAL   3.  Pt will undergo further assessment of balance test (DGI) with LTG written. Baseline:  Goal status: INITIAL   4.  Pt will improve TUG time to 18 seconds or less with LRAD in order to demo decrease fall risk. Baseline: 20.15 seconds with SBQC Goal status: INITIAL   5.  Pt will improve 30 second chair stand to at least 12 sit <> stands in order to demo improved functional strength with no UE support  Baseline: 11 sit <> stands Goal status: INITIAL       LONG TERM GOALS: Target date: 02/03/2022   Pt will be independent with final HEP in order to build upon functional gains made in therapy. Baseline:  Goal status: INITIAL   2.  DGI goal to be written as appropriate.  Baseline:  Goal status: INITIAL   3.  Pt will improve TUG time to 16.5 seconds or less with LRAD in order to demo decrease fall risk. Baseline: 20.15 seconds with SBQC Goal status: INITIAL   4.  Pt will ambulate outdoors at least 300' over unlevel surfaces with LRAD  and supervision in order to demo improved community mobility Baseline:  Goal status: INITIAL   5.  Pt will improve gait speed with LRAD to at least 2.0 ft/sec in order to demo improved community mobility. Baseline:  Goal status: INITIAL   ASSESSMENT:   CLINICAL IMPRESSION: Continued to address RLE strength and confidence in RLE stance this session.  Pt demonstrates steady improvement in symmetry of weight bearing during functional strengthening and pre-gait activities.  She would benefit from continued use of bioness with RLE in stance.  PT will continue to address deficits and progress towards LTGs as able.   OBJECTIVE IMPAIRMENTS Abnormal gait, decreased activity tolerance, decreased balance, decreased coordination, decreased endurance, decreased mobility, difficulty walking, decreased ROM, decreased strength, impaired flexibility, impaired sensation, impaired tone, and postural dysfunction.  ACTIVITY LIMITATIONS carrying, lifting, bending, and locomotion level   PARTICIPATION LIMITATIONS: driving, community activity, and yard work   PERSONAL FACTORS Behavior pattern, Past/current experiences, Time since onset of injury/illness/exacerbation, and 1-2 comorbidities: anxiety  are also affecting patient's functional outcome.    REHAB POTENTIAL: Good   CLINICAL DECISION MAKING: Stable/uncomplicated   EVALUATION COMPLEXITY: Low   PLAN: PT FREQUENCY: 1-2x/week   PT DURATION: 12 weeks   PLANNED INTERVENTIONS: Therapeutic exercises, Therapeutic activity, Neuromuscular re-education, Balance training, Gait training, Patient/Family education, Self Care, Joint mobilization, Stair training, Vestibular training, Orthotic/Fit training, DME instructions, Aquatic Therapy, Electrical stimulation, Manual therapy, and Re-evaluation   PLAN FOR NEXT SESSION: Get pt set up on Bioness for R ant tib/hamstring and work on exercises/gait-might be helpful w/ adv/retreat w/ RLE in stance. Work on RLE  strengthening, balance, weight shifting to R. Trial different canes. Treadmill training with harness. Finalize and update HEP.   Bary Richard, PT, DPT 12/28/2021, 1:47 PM

## 2021-12-30 ENCOUNTER — Ambulatory Visit: Payer: 59 | Admitting: Physical Therapy

## 2021-12-30 ENCOUNTER — Encounter: Payer: Self-pay | Admitting: Physical Therapy

## 2021-12-30 DIAGNOSIS — R29818 Other symptoms and signs involving the nervous system: Secondary | ICD-10-CM

## 2021-12-30 DIAGNOSIS — R2689 Other abnormalities of gait and mobility: Secondary | ICD-10-CM

## 2021-12-30 DIAGNOSIS — M6281 Muscle weakness (generalized): Secondary | ICD-10-CM

## 2021-12-30 DIAGNOSIS — R2681 Unsteadiness on feet: Secondary | ICD-10-CM

## 2021-12-30 NOTE — Therapy (Signed)
OUTPATIENT PHYSICAL THERAPY TREATMENT NOTE   Patient Name: Felicia Chen MRN: [id number] DOB:[birth year], 56 y.o., female Today's Date: 12/30/2021  PCP: Aretta Nip, MD REFERRING PROVIDER: Courtney Heys, MD  END OF SESSION:   PT End of Session - 12/30/21 1022     Visit Number 5    Number of Visits 21    Date for PT Re-Evaluation 03/09/22    Authorization Type UHC, VL:40, 20 USED    Authorization - Visit Number 4    Authorization - Number of Visits 20    PT Start Time 1020   pt late to session   PT Stop Time 1102    PT Time Calculation (min) 42 min    Equipment Utilized During Treatment Gait belt    Activity Tolerance Patient tolerated treatment well    Behavior During Therapy WFL for tasks assessed/performed;Anxious              Past Medical History:  Diagnosis Date   Anxiety    Gilbert's syndrome    Past Surgical History:  Procedure Laterality Date   ABDOMINAL HYSTERECTOMY N/A 06/11/2012   Procedure: HYSTERECTOMY ABDOMINAL;  Surgeon: Allena Katz, MD;  Location: [facility];  Service: Gynecology;  Laterality: N/A;   CESAREAN SECTION     x 4   CYSTOSCOPY N/A 06/11/2012   Procedure: CYSTOSCOPY;  Surgeon: Allena Katz, MD;  Location: [facility];  Service: Gynecology;  Laterality: N/A;   HERNIA REPAIR     LAPAROSCOPIC ASSISTED VAGINAL HYSTERECTOMY N/A 06/11/2012   Procedure: LAPAROSCOPIC ASSISTED VAGINAL HYSTERECTOMY atttempted;  Surgeon: Allena Katz, MD;  Location: [facility];  Service: Gynecology;  Laterality: N/A;  Attempted Laparoscopic assisted vaginal hysterectomy.   LAPAROSCOPIC LYSIS OF ADHESIONS N/A 06/11/2012   Procedure: LAPAROSCOPIC LYSIS OF ADHESIONS;  Surgeon: Allena Katz, MD;  Location: [facility];  Service: Gynecology;  Laterality: N/A;   SALPINGOOPHORECTOMY Bilateral 06/11/2012   Procedure: SALPINGO OOPHORECTOMY;  Surgeon: Allena Katz, MD;  Location: [facility];  Service: Gynecology;  Laterality: Bilateral;   Patient Active  Problem List   Diagnosis Date Noted   Spasticity as late effect of cerebrovascular accident (CVA) 10/21/2021   Abnormal gait due to muscle weakness 10/21/2021   Nausea without vomiting    Spastic hemiparesis (HCC)    History of hypertension    Neurogenic orthostatic hypotension (HCC)    Transaminitis    Right hemiplegia (HCC)    Hemorrhagic stroke (HCC)    Leucocytosis    Essential hypertension    Dyslipidemia    Right hemiparesis (HCC)    ICH (intracerebral hemorrhage) ([place]) 12/01/2020   HYPERLIPIDEMIA 07/06/2009   OBESITY 07/06/2009    REFERRING DIAG: I61.2 (ICD-10-CM) - Nontraumatic hemorrhage of left cerebral hemisphere (HCC) I69.398,R25.2 (ICD-10-CM) - Spasticity as late effect of cerebrovascular accident (CVA) M62.81,R26.9 (ICD-10-  THERAPY DIAG:  Muscle weakness (generalized)  Unsteadiness on feet  Other abnormalities of gait and mobility  Other symptoms and signs involving the nervous system  Rationale for Evaluation and Treatment Rehabilitation  PERTINENT HISTORY: hx of R hemiplegia due to stroke in 10/22- secondary to L frontal ICH and small SAH, anxiety  PRECAUTIONS: fall, R hemi  SUBJECTIVE:  Has been walking more at home, trying to walk without any shoes on. Trying to not use a cane in safe area.   PAIN:  Are you having pain? No "just generally sore"    TODAY'S TREATMENT:    [facility] Adult PT Treatment/Exercise -  Modalities    Modalities Sports coach Location R Ant Tib - steering electrodes and R Hamstring    Electrical Stimulation Action For strengthening, neuro re-ed, gait training     Electrical Stimulation Parameters See Tablet 1     Electrical Stimulation Goals Strength;Neuromuscular facilitation                  Set up hamstring cuff as well as addition to R anterior tib due to pt wearing shorts this session.    GAIT: Gait pattern: step through pattern,  decreased arm swing- Right, decreased step length- Left, decreased stance time- Right, decreased hip/knee flexion- Right, decreased ankle dorsiflexion- Right, circumduction- Right, Right hip hike, Right foot flat, decreased trunk rotation, and poor foot clearance- Right Distance walked: Clinic distances  Assistive device utilized: Quad cane small base and SPC with 4 prong tip Level of assistance: 460' x 1, plus additional distances.  Comments: concurrent with Bioness to R anterior tib for improved foot clearance and R hamstring for knee flexion during swing phase. Cued to also have pt think about heel strike during gait, bending knee during swing phase, and focusing on incr step length. Pt still with decr heel strike, but overall still improved foot clearance compared with no E-Stim and not having toe catch on the floor. Pt reporting feeling more steady during gait with use of E-Stim and more fluid gait pattern.    At end of session, also trialed [facility] with 4 prong tip compared to [facility] as pt reports feeling that she leans too far into [facility]. Pt able to walk with [facility] with 4 prong tip with supervision and pt reporting feeling like that she likes this cane. Will practice with it further at next session.    Concurrent with Bioness: Standing in // bars with 2" > 4" obstacle, during on time of 6 seconds stepping over obstacle with LLE and back over and then rest during off time, performed with RLE as stance leg with netural alignment. Progressing to UE support > none, with pt with improved weight shift compared to previous session and more confidence/stability in RLE, performed approx. 20 reps total. Then performed stepping over obstacle with RLE for incr hip flexion and ankle DF and obstacle negotiation during on time of 6 seconds and rest during off time. Performed x8 reps with pt having tendency to lift leg back over with hip hiking with PT helping pt with knee flexion when stepping back over. When performed  without Bioness, pt with difficulty clearing RLE fully over obstacle.    PATIENT EDUCATION: Education details: Purpose of Bioness and settings of Bioness with exercise/gait. Areas to continue work on in therapy.  Person educated: Patient and Child(ren) - pt's daughter Education method: Explanation Education comprehension: verbalized understanding     HOME EXERCISE PROGRAM: [id number] (from previous bout of therapy, will update/revise as needed)       GOALS: Goals reviewed with patient? Yes   SHORT TERM GOALS: Target date: 01/06/2022   Pt will improve gait speed with LRAD to at least 1.80 ft/sec in order to demo decr fall risk Baseline:21.22 seconds = 1.54 ft/sec with SBQC Goal status: INITIAL   2.  Pt will improve condition 4 of mCTSIB to at least 30 seconds in order to demo improved vestibular input for balance.  Baseline: 23 seconds Goal status: INITIAL   3.  Pt will undergo further assessment  of balance test (DGI) with LTG written. Baseline:  Goal status: INITIAL   4.  Pt will improve TUG time to 18 seconds or less with LRAD in order to demo decrease fall risk. Baseline: 20.15 seconds with SBQC Goal status: INITIAL   5.  Pt will improve 30 second chair stand to at least 12 sit <> stands in order to demo improved functional strength with no UE support  Baseline: 11 sit <> stands Goal status: INITIAL       LONG TERM GOALS: Target date: 02/03/2022   Pt will be independent with final HEP in order to build upon functional gains made in therapy. Baseline:  Goal status: INITIAL   2.  DGI goal to be written as appropriate.  Baseline:  Goal status: INITIAL   3.  Pt will improve TUG time to 16.5 seconds or less with LRAD in order to demo decrease fall risk. Baseline: 20.15 seconds with SBQC Goal status: INITIAL   4.  Pt will ambulate outdoors at least 300' over unlevel surfaces with LRAD and supervision in order to demo improved community mobility Baseline:  Goal  status: INITIAL   5.  Pt will improve gait speed with LRAD to at least 2.0 ft/sec in order to demo improved community mobility. Baseline:  Goal status: INITIAL   ASSESSMENT:   CLINICAL IMPRESSION: Pt wearing shorts today, so able to set up R hamstring cuff as well as R ant tib for improved knee flexion during swing phase during gait. With Bioness concurrent with gait, pt able to demo a more fluid gait pattern and improved RLE foot clearance. Pt also reporting feeling more steady with use of Bioness during gait. In sitting, pt with good ankle DF activation with Bioness (still some slight eversion), but during gait, pt still with decr heel strike. During activities concurrent Bioness with stance phase SLS on RLE, pt demonstrated improved stability and able to perform stepping over obstacles without UE support (improvement from previous session). Will continue to progress towards LTGs.    OBJECTIVE IMPAIRMENTS Abnormal gait, decreased activity tolerance, decreased balance, decreased coordination, decreased endurance, decreased mobility, difficulty walking, decreased ROM, decreased strength, impaired flexibility, impaired sensation, impaired tone, and postural dysfunction.    ACTIVITY LIMITATIONS carrying, lifting, bending, and locomotion level   PARTICIPATION LIMITATIONS: driving, community activity, and yard work   PERSONAL FACTORS Behavior pattern, Past/current experiences, Time since onset of injury/illness/exacerbation, and 1-2 comorbidities: anxiety  are also affecting patient's functional outcome.    REHAB POTENTIAL: Good   CLINICAL DECISION MAKING: Stable/uncomplicated   EVALUATION COMPLEXITY: Low   PLAN: PT FREQUENCY: 1-2x/week   PT DURATION: 12 weeks   PLANNED INTERVENTIONS: Therapeutic exercises, Therapeutic activity, Neuromuscular re-education, Balance training, Gait training, Patient/Family education, Self Care, Joint mobilization, Stair training, Vestibular training,  Orthotic/Fit training, DME instructions, Aquatic Therapy, Electrical stimulation, Manual therapy, and Re-evaluation   PLAN FOR NEXT SESSION: Continue Bioness for R ant tib/hamstring and work on exercises/gait-might be helpful w/ adv/retreat w/ RLE in stance. Work on RLE strengthening, balance, weight shifting to R. Trial different canes. Treadmill training with harness. Finalize and update HEP.   Arliss Journey, PT, DPT 12/30/2021, 12:14 PM

## 2022-01-03 ENCOUNTER — Encounter: Payer: Self-pay | Admitting: Physical Therapy

## 2022-01-04 ENCOUNTER — Encounter: Payer: Self-pay | Admitting: Physical Therapy

## 2022-01-04 ENCOUNTER — Ambulatory Visit: Payer: 59 | Admitting: Physical Therapy

## 2022-01-04 DIAGNOSIS — R2681 Unsteadiness on feet: Secondary | ICD-10-CM

## 2022-01-04 DIAGNOSIS — M6281 Muscle weakness (generalized): Secondary | ICD-10-CM | POA: Diagnosis not present

## 2022-01-04 DIAGNOSIS — R2689 Other abnormalities of gait and mobility: Secondary | ICD-10-CM

## 2022-01-04 NOTE — Therapy (Signed)
OUTPATIENT PHYSICAL THERAPY TREATMENT NOTE   Patient Name: Felicia Chen MRN: [id number] DOB:1965-06-18, 56 y.o., female Today's Date: 01/04/2022  PCP: Aretta Nip, MD REFERRING PROVIDER: Courtney Heys, MD  END OF SESSION:   PT End of Session - 01/04/22 0934     Visit Number 6    Number of Visits 21    Date for PT Re-Evaluation 03/09/22    Authorization Type UHC, VL:40, [place] - Visit Number 5    Authorization - Number of Visits 20    PT Start Time [phone]    PT Stop Time 1014    PT Time Calculation (min) 43 min    Equipment Utilized During Treatment Gait belt    Activity Tolerance Patient tolerated treatment well    Behavior During Therapy WFL for tasks assessed/performed;Anxious              Past Medical History:  Diagnosis Date   Anxiety    Gilbert's syndrome    Past Surgical History:  Procedure Laterality Date   ABDOMINAL HYSTERECTOMY N/A 06/11/2012   Procedure: HYSTERECTOMY ABDOMINAL;  Surgeon: Allena Katz, MD;  Location: [facility];  Service: Gynecology;  Laterality: N/A;   CESAREAN SECTION     x 4   CYSTOSCOPY N/A 06/11/2012   Procedure: CYSTOSCOPY;  Surgeon: Allena Katz, MD;  Location: [facility];  Service: Gynecology;  Laterality: N/A;   HERNIA REPAIR     LAPAROSCOPIC ASSISTED VAGINAL HYSTERECTOMY N/A 06/11/2012   Procedure: LAPAROSCOPIC ASSISTED VAGINAL HYSTERECTOMY atttempted;  Surgeon: Allena Katz, MD;  Location: [facility];  Service: Gynecology;  Laterality: N/A;  Attempted Laparoscopic assisted vaginal hysterectomy.   LAPAROSCOPIC LYSIS OF ADHESIONS N/A 06/11/2012   Procedure: LAPAROSCOPIC LYSIS OF ADHESIONS;  Surgeon: Allena Katz, MD;  Location: [facility];  Service: Gynecology;  Laterality: N/A;   SALPINGOOPHORECTOMY Bilateral 06/11/2012   Procedure: SALPINGO OOPHORECTOMY;  Surgeon: Allena Katz, MD;  Location: [facility];  Service: Gynecology;  Laterality: Bilateral;   Patient Active Problem List    Diagnosis Date Noted   Spasticity as late effect of cerebrovascular accident (CVA) 10/21/2021   Abnormal gait due to muscle weakness 10/21/2021   Nausea without vomiting    Spastic hemiparesis (HCC)    History of hypertension    Neurogenic orthostatic hypotension (HCC)    Transaminitis    Right hemiplegia (HCC)    Hemorrhagic stroke (HCC)    Leucocytosis    Essential hypertension    Dyslipidemia    Right hemiparesis (HCC)    ICH (intracerebral hemorrhage) ([place]) 12/01/2020   HYPERLIPIDEMIA 07/06/2009   OBESITY 07/06/2009    REFERRING DIAG: I61.2 (ICD-10-CM) - Nontraumatic hemorrhage of left cerebral hemisphere (HCC) I69.398,R25.2 (ICD-10-CM) - Spasticity as late effect of cerebrovascular accident (CVA) M62.81,R26.9 (ICD-10-  THERAPY DIAG:  Muscle weakness (generalized)  Unsteadiness on feet  Other abnormalities of gait and mobility  Rationale for Evaluation and Treatment Rehabilitation  PERTINENT HISTORY: hx of R hemiplegia due to stroke in 10/22- secondary to L frontal ICH and small SAH, anxiety  PRECAUTIONS: fall, R hemi  SUBJECTIVE:  Noticed that her toes were not as curled initially in standing. Noticing her dizziness from the meds is getting better. Has been trying to walk on different surfaces without shoes on.   PAIN:  Are you having pain? No     TODAY'S TREATMENT:   GAIT: Gait pattern: step through pattern, decreased arm swing- Right, decreased step length- Left,  decreased stance time- Right, decreased hip/knee flexion- Right, decreased ankle dorsiflexion- Right, circumduction- Right, Right hip hike, Right foot flat, decreased trunk rotation, and poor foot clearance- Right Distance walked: Clinic distances  Assistive device utilized: [facility] with 4 prong tip and use of R foot up brace Level of assistance: 230' x1 indoors, 300' outdoors on pavement, 50' outdoors on grass.  Comments:  Trialed SPC with 4 prong tip and R foot up brace for improved foot clearance (pt  has one at home but did not bring it in today). Worked on gait training with incr stance time on RLE and bigger step on LLE and focusing on RLE terminal stance > swing phase with focus on bending R knee and using R hip flexion to bring leg through vs compensating with circumduction/hip hike. Pt responded well to this cue and ambulated slower to focus on gait. Performed gait outdoors on paved surfaces with supervision with continued focus on using R hip flexors to bring leg through. Pt wanting to try ambulating on grass with pt needing CGA for safety, pt with decr stance time on RLE, discussed that if pt needs to ambulate on grass at home to make sure she has someone with her and to wear her foot up brace for improved foot clearance. Pt overall preferring SPC with quad tip compared to [facility] and pt ambulating well with it.     Showed pt and pt's daughter where to purchase SPC with quad tip vs using SBQC. Pt plans to order and use for home and graduate from a [employer]     Treadmill training at speed: .8 mph (initially varying from 1.0 - 1.3, but decided on .8 mph due to improved gait mechanics going more slowly vs. Fast), pt holding on with BUE. Pt has a treadmill at home and has been using it at times for gait. Used blue shoe cover on RLE to help prevent pt's toe from getting caught. With faster speeds, pt demonstrating more compensatory gait such as RLE circumduction to clear foot. Pt with improved gait mechanics at speed .73mh, continued to focus on incr stride length for terminal stance and for incr R knee flexion and hip flexion during swing phase to clear leg. Performed a total of 7 minutes. Pt able to verbalize when not performing correctly. Discussed continuing to work on gait at home on treadmill with a slowed pace for improved gait mechanics. Would rather have pt go slower on the treadmill at less time at focus on improved quality vs. Distance. Pt verbalized understanding and plans to work on this at home.  Also gave pt some blue shoe covers to use for home on the treadmill.    PATIENT EDUCATION: Education details: where to purchase SPC with quad tip, working on gait training at home  with cane and on treadmill (see above for more further details) Person educated: Patient and Child(ren) - pt's daughter Education method: Explanation Education comprehension: verbalized understanding     HOME EXERCISE PROGRAM: [id number] (from previous bout of therapy, will update/revise as needed)       GOALS: Goals reviewed with patient? Yes   SHORT TERM GOALS: Target date: 01/06/2022   Pt will improve gait speed with LRAD to at least 1.80 ft/sec in order to demo decr fall risk Baseline:21.22 seconds = 1.54 ft/sec with SBQC Goal status: INITIAL   2.  Pt will improve condition 4 of mCTSIB to at least 30 seconds in order to demo improved vestibular input for balance.  Baseline:  23 seconds Goal status: INITIAL   3.  Pt will undergo further assessment of balance test (DGI) with LTG written. Baseline:  Goal status: INITIAL   4.  Pt will improve TUG time to 18 seconds or less with LRAD in order to demo decrease fall risk. Baseline: 20.15 seconds with SBQC Goal status: INITIAL   5.  Pt will improve 30 second chair stand to at least 12 sit <> stands in order to demo improved functional strength with no UE support  Baseline: 11 sit <> stands Goal status: INITIAL       LONG TERM GOALS: Target date: 02/03/2022   Pt will be independent with final HEP in order to build upon functional gains made in therapy. Baseline:  Goal status: INITIAL   2.  DGI goal to be written as appropriate.  Baseline:  Goal status: INITIAL   3.  Pt will improve TUG time to 16.5 seconds or less with LRAD in order to demo decrease fall risk. Baseline: 20.15 seconds with SBQC Goal status: INITIAL   4.  Pt will ambulate outdoors at least 300' over unlevel surfaces with LRAD and supervision in order to demo improved community  mobility Baseline:  Goal status: INITIAL   5.  Pt will improve gait speed with LRAD to at least 2.0 ft/sec in order to demo improved community mobility. Baseline:  Goal status: INITIAL   ASSESSMENT:   CLINICAL IMPRESSION: Worked on gait training with use of SPC with 4 prong tip. Pt able to ambulate indoors and outdoors on paved surfaces with supervision. Did need CGA for grass surfaces. Focused on gait training with knee flexion and hip flexion during terminal stance > swing to assist with foot clearance and decr compensatory circumduction. Pt responding well to these cues when she focuses. Graduated pt from [facility] to [facility] with 4 prong tip (pt preferring this cane as well). Showed pt purchase information for home. Will continue to progress towards LTGs.    OBJECTIVE IMPAIRMENTS Abnormal gait, decreased activity tolerance, decreased balance, decreased coordination, decreased endurance, decreased mobility, difficulty walking, decreased ROM, decreased strength, impaired flexibility, impaired sensation, impaired tone, and postural dysfunction.    ACTIVITY LIMITATIONS carrying, lifting, bending, and locomotion level   PARTICIPATION LIMITATIONS: driving, community activity, and yard work   PERSONAL FACTORS Behavior pattern, Past/current experiences, Time since onset of injury/illness/exacerbation, and 1-2 comorbidities: anxiety  are also affecting patient's functional outcome.    REHAB POTENTIAL: Good   CLINICAL DECISION MAKING: Stable/uncomplicated   EVALUATION COMPLEXITY: Low   PLAN: PT FREQUENCY: 1-2x/week   PT DURATION: 12 weeks   PLANNED INTERVENTIONS: Therapeutic exercises, Therapeutic activity, Neuromuscular re-education, Balance training, Gait training, Patient/Family education, Self Care, Joint mobilization, Stair training, Vestibular training, Orthotic/Fit training, DME instructions, Aquatic Therapy, Electrical stimulation, Manual therapy, and Re-evaluation   PLAN FOR NEXT  SESSION: Continue Bioness for R ant tib/hamstring and work on exercises/gait-might be helpful w/ adv/retreat w/ RLE in stance. Work on RLE strengthening, balance, weight shifting to R. Treadmill training with harness. Finalize and update HEP.   Arliss Journey, PT, DPT 01/04/2022, 12:22 PM

## 2022-01-06 ENCOUNTER — Encounter: Payer: Self-pay | Admitting: Physical Therapy

## 2022-01-06 ENCOUNTER — Ambulatory Visit: Payer: 59 | Admitting: Physical Therapy

## 2022-01-06 DIAGNOSIS — R2689 Other abnormalities of gait and mobility: Secondary | ICD-10-CM

## 2022-01-06 DIAGNOSIS — M6281 Muscle weakness (generalized): Secondary | ICD-10-CM | POA: Diagnosis not present

## 2022-01-06 DIAGNOSIS — R29818 Other symptoms and signs involving the nervous system: Secondary | ICD-10-CM

## 2022-01-06 DIAGNOSIS — R2681 Unsteadiness on feet: Secondary | ICD-10-CM

## 2022-01-06 NOTE — Therapy (Signed)
OUTPATIENT PHYSICAL THERAPY TREATMENT NOTE   Patient Name: Felicia Chen MRN: [id number] DOB:21-Sep-1965, 56 y.o., female Today's Date: 01/06/2022  PCP: Aretta Nip, MD REFERRING PROVIDER: Courtney Heys, MD  END OF SESSION:   PT End of Session - 01/06/22 1023     Visit Number 7    Number of Visits 21    Date for PT Re-Evaluation 03/09/22    Authorization Type UHC, VL:40, 20 USED    Authorization - Visit Number 6    Authorization - Number of Visits 20    PT Start Time 1020   pt late to session   PT Stop Time 1100    PT Time Calculation (min) 40 min    Equipment Utilized During Treatment Gait belt    Activity Tolerance Patient tolerated treatment well    Behavior During Therapy WFL for tasks assessed/performed;Anxious              Past Medical History:  Diagnosis Date   Anxiety    Gilbert's syndrome    Past Surgical History:  Procedure Laterality Date   ABDOMINAL HYSTERECTOMY N/A 06/11/2012   Procedure: HYSTERECTOMY ABDOMINAL;  Surgeon: Allena Katz, MD;  Location: [facility];  Service: Gynecology;  Laterality: N/A;   CESAREAN SECTION     x 4   CYSTOSCOPY N/A 06/11/2012   Procedure: CYSTOSCOPY;  Surgeon: Allena Katz, MD;  Location: [facility];  Service: Gynecology;  Laterality: N/A;   HERNIA REPAIR     LAPAROSCOPIC ASSISTED VAGINAL HYSTERECTOMY N/A 06/11/2012   Procedure: LAPAROSCOPIC ASSISTED VAGINAL HYSTERECTOMY atttempted;  Surgeon: Allena Katz, MD;  Location: [facility];  Service: Gynecology;  Laterality: N/A;  Attempted Laparoscopic assisted vaginal hysterectomy.   LAPAROSCOPIC LYSIS OF ADHESIONS N/A 06/11/2012   Procedure: LAPAROSCOPIC LYSIS OF ADHESIONS;  Surgeon: Allena Katz, MD;  Location: [facility];  Service: Gynecology;  Laterality: N/A;   SALPINGOOPHORECTOMY Bilateral 06/11/2012   Procedure: SALPINGO OOPHORECTOMY;  Surgeon: Allena Katz, MD;  Location: [facility];  Service: Gynecology;  Laterality: Bilateral;   Patient Active  Problem List   Diagnosis Date Noted   Spasticity as late effect of cerebrovascular accident (CVA) 10/21/2021   Abnormal gait due to muscle weakness 10/21/2021   Nausea without vomiting    Spastic hemiparesis (HCC)    History of hypertension    Neurogenic orthostatic hypotension (HCC)    Transaminitis    Right hemiplegia (HCC)    Hemorrhagic stroke (HCC)    Leucocytosis    Essential hypertension    Dyslipidemia    Right hemiparesis (HCC)    ICH (intracerebral hemorrhage) ([place]) 12/01/2020   HYPERLIPIDEMIA 07/06/2009   OBESITY 07/06/2009    REFERRING DIAG: I61.2 (ICD-10-CM) - Nontraumatic hemorrhage of left cerebral hemisphere (HCC) I69.398,R25.2 (ICD-10-CM) - Spasticity as late effect of cerebrovascular accident (CVA) M62.81,R26.9 (ICD-10-  THERAPY DIAG:  Muscle weakness (generalized)  Unsteadiness on feet  Other abnormalities of gait and mobility  Other symptoms and signs involving the nervous system  Rationale for Evaluation and Treatment Rehabilitation  PERTINENT HISTORY: hx of R hemiplegia due to stroke in 10/22- secondary to L frontal ICH and small SAH, anxiety  PRECAUTIONS: fall, R hemi  SUBJECTIVE:  Has been working on her walking at home. Ordered the cane from [place]:  Are you having pain? No     TODAY'S TREATMENT:    [facility] Adult PT Treatment/Exercise -  Modalities    Modalities Emergency planning/management officer Location R Ant Tib - steering electrodes and R Hamstring    Electrical Stimulation Action For strengthening, neuro re-ed, gait training     Electrical Stimulation Parameters See Tablet 1     Electrical Stimulation Goals Strength;Neuromuscular facilitation                  GAIT: Gait pattern: step through pattern, decreased arm swing- Right, decreased step length- Left, decreased stance time- Right, decreased hip/knee flexion- Right, decreased ankle dorsiflexion-  Right, circumduction- Right, Right hip hike, Right foot flat, decreased trunk rotation, and poor foot clearance- Right Distance walked: Clinic distances  Assistive device utilized:  [facility] with 4 prong tip Level of assistance: 115' x 1 Pt ambulating in and out of session with [facility] with pt focusing on incr hip knee flexion during swing phase and decr compensatory circumduction.  Comments: Concurrent with Bioness to R anterior tib for improved foot clearance and R hamstring for knee flexion during swing phase. Cued to also have pt think about heel strike during gait, bending knee during swing phase and hip flexion with RLE. ,Pt still with decr heel strike, but overall still improved foot clearance compared with no E-Stim. Pt with one episode of taking too large of a step with LLE and then getting R foot caught on the floor, needing min A for balance. Pt reporting feeling more anxious with gait today with more people in therapy gym. Cues for deep breathing and trying to relax during gait   Concurrent with Bioness to RLE: Supine on mat table: Hamstring heel slides during on time for 8 seconds and off for 6 seconds - with pt's R foot on sliding board with pillow over it for improved motion and therapist helping into knee flexion position, cues to keep heel on the board- performed x10 reps total, bilat hamstring heel digs on red physioball and bringing knees into chest and then extending during off time, x10 reps, pt able to manage physioball without assist from therapist.  SLS on RLE during on time and rest during off times, x15 reps  - having LLE tap to 6" tap and tap 3 colorful floor dots for incr SLS time on RLE. Beginning with UE support > none. Min guard/min A for balance.    PATIENT EDUCATION: Education details: Continue working on gait at home with cane.  Person educated: Patient and Child(ren) - pt's daughter Education method: Explanation Education comprehension: verbalized understanding     HOME  EXERCISE PROGRAM: [id number] (from previous bout of therapy, will update/revise as needed)       GOALS: Goals reviewed with patient? Yes   SHORT TERM GOALS: Target date: 01/06/2022   Pt will improve gait speed with LRAD to at least 1.80 ft/sec in order to demo decr fall risk Baseline:21.22 seconds = 1.54 ft/sec with SBQC Goal status: INITIAL   2.  Pt will improve condition 4 of mCTSIB to at least 30 seconds in order to demo improved vestibular input for balance.  Baseline: 23 seconds Goal status: INITIAL   3.  Pt will undergo further assessment of balance test (DGI) with LTG written. Baseline:  Goal status: INITIAL   4.  Pt will improve TUG time to 18 seconds or less with LRAD in order to demo decrease fall risk. Baseline: 20.15 seconds with SBQC Goal status: INITIAL   5.  Pt will improve 30 second chair stand to at least 12 sit <> stands in order to demo improved functional strength with no UE support  Baseline: 11 sit <> stands Goal status: INITIAL       LONG TERM GOALS: Target date: 02/03/2022   Pt will be independent with final HEP in order to build upon functional gains made in therapy. Baseline:  Goal status: INITIAL   2.  DGI goal to be written as appropriate.  Baseline:  Goal status: INITIAL   3.  Pt will improve TUG time to 16.5 seconds or less with LRAD in order to demo decrease fall risk. Baseline: 20.15 seconds with SBQC Goal status: INITIAL   4.  Pt will ambulate outdoors at least 300' over unlevel surfaces with LRAD and supervision in order to demo improved community mobility Baseline:  Goal status: INITIAL   5.  Pt will improve gait speed with LRAD to at least 2.0 ft/sec in order to demo improved community mobility. Baseline:  Goal status: INITIAL   ASSESSMENT:   CLINICAL IMPRESSION: Continued to use Bioness concurrent with gait and RLE strengthening exercises for hamstring and for standing balance. Pt more anxious while ambulating today with  Bioness and had one episode of getting R toe stuck due to taking too large of a step with LLE. PT needing to provide min guard/min A for balance. Overall, pt able to tolerate exercises with Bioness well. Pt able to incr SLS time without UE support on RLE concurrent with Bioness. Will continue to progress towards LTGs.    OBJECTIVE IMPAIRMENTS Abnormal gait, decreased activity tolerance, decreased balance, decreased coordination, decreased endurance, decreased mobility, difficulty walking, decreased ROM, decreased strength, impaired flexibility, impaired sensation, impaired tone, and postural dysfunction.    ACTIVITY LIMITATIONS carrying, lifting, bending, and locomotion level   PARTICIPATION LIMITATIONS: driving, community activity, and yard work   PERSONAL FACTORS Behavior pattern, Past/current experiences, Time since onset of injury/illness/exacerbation, and 1-2 comorbidities: anxiety  are also affecting patient's functional outcome.    REHAB POTENTIAL: Good   CLINICAL DECISION MAKING: Stable/uncomplicated   EVALUATION COMPLEXITY: Low   PLAN: PT FREQUENCY: 1-2x/week   PT DURATION: 12 weeks   PLANNED INTERVENTIONS: Therapeutic exercises, Therapeutic activity, Neuromuscular re-education, Balance training, Gait training, Patient/Family education, Self Care, Joint mobilization, Stair training, Vestibular training, Orthotic/Fit training, DME instructions, Aquatic Therapy, Electrical stimulation, Manual therapy, and Re-evaluation   PLAN FOR NEXT SESSION: Continue Bioness for R ant tib/hamstring and work on exercises/gait-might be helpful w/ adv/retreat w/ RLE in stance. Work on RLE strengthening, balance, weight shifting to R. Gait training and improving quality of gait.   Arliss Journey, PT, DPT 01/06/2022, 12:17 PM

## 2022-01-11 ENCOUNTER — Ambulatory Visit: Payer: 59

## 2022-01-12 ENCOUNTER — Encounter: Payer: Self-pay | Admitting: Physical Therapy

## 2022-01-12 ENCOUNTER — Ambulatory Visit: Payer: 59 | Admitting: Physical Therapy

## 2022-01-12 DIAGNOSIS — R2681 Unsteadiness on feet: Secondary | ICD-10-CM

## 2022-01-12 DIAGNOSIS — R2689 Other abnormalities of gait and mobility: Secondary | ICD-10-CM

## 2022-01-12 DIAGNOSIS — M6281 Muscle weakness (generalized): Secondary | ICD-10-CM

## 2022-01-12 DIAGNOSIS — R29818 Other symptoms and signs involving the nervous system: Secondary | ICD-10-CM

## 2022-01-12 NOTE — Therapy (Signed)
OUTPATIENT PHYSICAL THERAPY TREATMENT NOTE   Patient Name: Felicia Chen MRN: [id number] DOB:09/19/1965, 56 y.o., female Today's Date: 01/12/2022  PCP: Aretta Nip, MD REFERRING PROVIDER: Courtney Heys, MD  END OF SESSION:   PT End of Session - 01/12/22 0937     Visit Number 8    Number of Visits 21    Date for PT Re-Evaluation 03/09/22    Authorization Type UHC, [id number], 20 USED    Authorization - Visit Number 7    Authorization - Number of Visits 20    PT Start Time 6712   pt late to session   Equipment Utilized During Treatment Gait belt    Activity Tolerance Patient tolerated treatment well    Behavior During Therapy [facility] for tasks assessed/performed;Anxious              Past Medical History:  Diagnosis Date   Anxiety    Gilbert's syndrome    Past Surgical History:  Procedure Laterality Date   ABDOMINAL HYSTERECTOMY N/A 06/11/2012   Procedure: HYSTERECTOMY ABDOMINAL;  Surgeon: Allena Katz, MD;  Location: [facility];  Service: Gynecology;  Laterality: N/A;   CESAREAN SECTION     x 4   CYSTOSCOPY N/A 06/11/2012   Procedure: CYSTOSCOPY;  Surgeon: Allena Katz, MD;  Location: [facility];  Service: Gynecology;  Laterality: N/A;   HERNIA REPAIR     LAPAROSCOPIC ASSISTED VAGINAL HYSTERECTOMY N/A 06/11/2012   Procedure: LAPAROSCOPIC ASSISTED VAGINAL HYSTERECTOMY atttempted;  Surgeon: Allena Katz, MD;  Location: [facility];  Service: Gynecology;  Laterality: N/A;  Attempted Laparoscopic assisted vaginal hysterectomy.   LAPAROSCOPIC LYSIS OF ADHESIONS N/A 06/11/2012   Procedure: LAPAROSCOPIC LYSIS OF ADHESIONS;  Surgeon: Allena Katz, MD;  Location: [facility];  Service: Gynecology;  Laterality: N/A;   SALPINGOOPHORECTOMY Bilateral 06/11/2012   Procedure: SALPINGO OOPHORECTOMY;  Surgeon: Allena Katz, MD;  Location: [facility];  Service: Gynecology;  Laterality: Bilateral;   Patient Active Problem List   Diagnosis Date Noted   Spasticity as late  effect of cerebrovascular accident (CVA) 10/21/2021   Abnormal gait due to muscle weakness 10/21/2021   Nausea without vomiting    Spastic hemiparesis (HCC)    History of hypertension    Neurogenic orthostatic hypotension (HCC)    Transaminitis    Right hemiplegia (HCC)    Hemorrhagic stroke (HCC)    Leucocytosis    Essential hypertension    Dyslipidemia    Right hemiparesis (HCC)    ICH (intracerebral hemorrhage) ([place]) 12/01/2020   HYPERLIPIDEMIA 07/06/2009   OBESITY 07/06/2009    REFERRING DIAG: I61.2 (ICD-10-CM) - Nontraumatic hemorrhage of left cerebral hemisphere (HCC) I69.398,R25.2 (ICD-10-CM) - Spasticity as late effect of cerebrovascular accident (CVA) M62.81,R26.9 (ICD-10-  THERAPY DIAG:  Muscle weakness (generalized)  Unsteadiness on feet  Other abnormalities of gait and mobility  Other symptoms and signs involving the nervous system  Rationale for Evaluation and Treatment Rehabilitation  PERTINENT HISTORY: hx of R hemiplegia due to stroke in 10/22- secondary to L frontal ICH and small SAH, anxiety  PRECAUTIONS: fall, R hemi  SUBJECTIVE:  Went to [facility] for their PT program. Ordered her cane, but did not bring it in today.   PAIN:  Are you having pain? No     TODAY'S TREATMENT:   Gait Assessment: Gait speed with SBQC: 20 seconds = 1.64 ft/sec  30 second chair stand: 12 sit <> stands with feet equal distance with no UE support  TUG with SBQC: 15.4 seconds    GAIT: Gait pattern: step through pattern, decreased arm swing- Right, decreased step length- Left, decreased stance time- Right, decreased hip/knee flexion- Right, decreased ankle dorsiflexion- Right, circumduction- Right, Right hip hike, Right foot flat, decreased trunk rotation, and poor foot clearance- Right Distance walked: Clinic distances  Assistive device utilized:  SBQC Level of assistance:Supervision.  Pt ambulating in and out of session with [facility] with pt focusing on incr hip knee  flexion during swing phase and decr compensatory circumduction.  NMR:  Performed floor transfer with pt turning to face mat table, using BUE and stepping back with LLE and lowering through RLE  into a half kneel position, from there pt getting into tall kneeling, turning and then getting in half kneel position again with RLE posteriorly. Pt needing CGA when going down to the floor and min A at times to help get legs into position. When standing back up from mat table, pt stepping forwards with RLE into a half kneel position and pressing up with BUE from mat table to stand. Pt needing CGA/min A initially in standing due to incr RLE tremors/shaking after exercises. Pt then able to hold onto chair and turn to sit down on mat table.    Half kneel weight shifting with RLE posteriorly - cued for scap retraction and posture x10 reps with pt intermittently needing UE support for balance, 10 reps head turns, 10 reps head nods. Pt more challenged with head turns for balance. Tall kneeling mini squats with 1# ball toss with 2nd PT (tossing ball when coming up into hip extension) then progressing to kneeling on air ex for a compliant surface and biasing R side when coming down into a tall kneel squat, pt with incr difficulty initially coming down to R, needing intermittent UE support, but improving with incr reps. Performed approx. 20 reps total.  Eyes closed on air ex in standing static stance with feet apart x30 seconds - cues for incr weight shift through RLE, 30 seconds EC with weight shifting from R to L.  10 reps mini squats with hip ADD with pillow with focus on equal weight bearing (pt to work on adding pillow between legs for mini squats at home).    PATIENT EDUCATION: Education details: Progress towards goals.  Person educated: Patient and pt's Husband  Education method: Explanation Education comprehension: verbalized understanding     HOME EXERCISE PROGRAM: [email or web address] (from previous bout of therapy,  will update/revise as needed)       GOALS: Goals reviewed with patient? Yes   SHORT TERM GOALS: Target date: 01/06/2022   Pt will improve gait speed with LRAD to at least 1.80 ft/sec in order to demo decr fall risk Baseline:21.22 seconds = 1.54 ft/sec with SBQC; 20 seconds = 1.64 ft/sec on 01/12/22 Goal status: NOT MET  2.  Pt will improve condition 4 of mCTSIB to at least 30 seconds in order to demo improved vestibular input for balance.  Baseline: 23 seconds Goal status: INITIAL   3.  Pt will undergo further assessment of balance test (DGI) with LTG written. Baseline:  Goal status: DEFERRED   4.  Pt will improve TUG time to 18 seconds or less with LRAD in order to demo decrease fall risk. Baseline: 20.15 seconds with SBQC; 15.4 seconds on 01/12/22 Goal status: MET   5.  Pt will improve 30 second chair stand to at least 12 sit <> stands in order to demo improved functional strength with no  UE support  Baseline: 11 sit <> stands; 12 sit <> stands on 01/02/22 Goal status: MET     LONG TERM GOALS: Target date: 02/03/2022   Pt will be independent with final HEP in order to build upon functional gains made in therapy. Baseline:  Goal status: INITIAL   2.  DGI goal to be written as appropriate.  Baseline:  Goal status: INITIAL   3.  Pt will improve TUG time to 14.5 seconds or less with LRAD in order to demo decrease fall risk. Baseline: 20.15 seconds with SBQC Goal status: REVISED   4.  Pt will ambulate outdoors at least 300' over unlevel surfaces with LRAD and supervision in order to demo improved community mobility Baseline:  Goal status: INITIAL   5.  Pt will improve gait speed with LRAD to at least 2.0 ft/sec in order to demo improved community mobility. Baseline:  Goal status: INITIAL   ASSESSMENT:   CLINICAL IMPRESSION: Began to check STGs today. Pt met STGs #4 and #5 in regards to TUG and 30 second chair stand. Pt able to perform 12 sit <> stands in 30 seconds  with improved weight bearing between legs. Pt did not meet goal in regards to gait speed. However, pt did improve gait speed with SBQC to 1.64 ft/sec (previously 1.54 ft/sec) and pt's overall gait quality has improved since starting therapy. Remainder of session focused on RLE>LLE strengthening and balance tasks in half/tall kneel and on foam. Pt challenged by half kneeling tasks with RLE posteriorly. Will continue to progress towards LTGs.    OBJECTIVE IMPAIRMENTS Abnormal gait, decreased activity tolerance, decreased balance, decreased coordination, decreased endurance, decreased mobility, difficulty walking, decreased ROM, decreased strength, impaired flexibility, impaired sensation, impaired tone, and postural dysfunction.    ACTIVITY LIMITATIONS carrying, lifting, bending, and locomotion level   PARTICIPATION LIMITATIONS: driving, community activity, and yard work   PERSONAL FACTORS Behavior pattern, Past/current experiences, Time since onset of injury/illness/exacerbation, and 1-2 comorbidities: anxiety  are also affecting patient's functional outcome.    REHAB POTENTIAL: Good   CLINICAL DECISION MAKING: Stable/uncomplicated   EVALUATION COMPLEXITY: Low   PLAN: PT FREQUENCY: 1-2x/week   PT DURATION: 12 weeks   PLANNED INTERVENTIONS: Therapeutic exercises, Therapeutic activity, Neuromuscular re-education, Balance training, Gait training, Patient/Family education, Self Care, Joint mobilization, Stair training, Vestibular training, Orthotic/Fit training, DME instructions, Aquatic Therapy, Electrical stimulation, Manual therapy, and Re-evaluation   PLAN FOR NEXT SESSION: *UPDATE AND REVIEW HEP* Continue Bioness for R ant tib/hamstring and work on exercises/gait-might be helpful w/ adv/retreat w/ RLE in stance. Work on RLE strengthening, balance, weight shifting to R. Gait training and improving quality of gait.   Arliss Journey, PT, DPT 01/12/2022, 9:37 AM

## 2022-01-13 ENCOUNTER — Ambulatory Visit: Payer: 59 | Admitting: Physical Therapy

## 2022-01-13 ENCOUNTER — Encounter: Payer: Self-pay | Admitting: Physical Therapy

## 2022-01-13 DIAGNOSIS — M6281 Muscle weakness (generalized): Secondary | ICD-10-CM | POA: Diagnosis not present

## 2022-01-13 DIAGNOSIS — R2681 Unsteadiness on feet: Secondary | ICD-10-CM

## 2022-01-13 DIAGNOSIS — R29818 Other symptoms and signs involving the nervous system: Secondary | ICD-10-CM

## 2022-01-13 DIAGNOSIS — R2689 Other abnormalities of gait and mobility: Secondary | ICD-10-CM

## 2022-01-13 NOTE — Therapy (Signed)
OUTPATIENT PHYSICAL THERAPY TREATMENT NOTE   Patient Name: QUINTA EIMER MRN: [id number] DOB:1965/06/10, 56 y.o., female Today's Date: 01/13/2022  PCP: Aretta Nip, MD REFERRING PROVIDER: Courtney Heys, MD  END OF SESSION:   PT End of Session - 01/13/22 1110     Visit Number 9    Number of Visits 21    Date for PT Re-Evaluation 03/09/22    Authorization Type UHC, VL:40, 20 USED    Authorization - Visit Number 8    Authorization - Number of Visits 20    PT Start Time 1107   pt late to session   PT Stop Time 1148    PT Time Calculation (min) 41 min    Equipment Utilized During Treatment Gait belt    Activity Tolerance Patient tolerated treatment well    Behavior During Therapy WFL for tasks assessed/performed;Anxious              Past Medical History:  Diagnosis Date   Anxiety    Gilbert's syndrome    Past Surgical History:  Procedure Laterality Date   ABDOMINAL HYSTERECTOMY N/A 06/11/2012   Procedure: HYSTERECTOMY ABDOMINAL;  Surgeon: Allena Katz, MD;  Location: [facility];  Service: Gynecology;  Laterality: N/A;   CESAREAN SECTION     x 4   CYSTOSCOPY N/A 06/11/2012   Procedure: CYSTOSCOPY;  Surgeon: Allena Katz, MD;  Location: [facility];  Service: Gynecology;  Laterality: N/A;   HERNIA REPAIR     LAPAROSCOPIC ASSISTED VAGINAL HYSTERECTOMY N/A 06/11/2012   Procedure: LAPAROSCOPIC ASSISTED VAGINAL HYSTERECTOMY atttempted;  Surgeon: Allena Katz, MD;  Location: [facility];  Service: Gynecology;  Laterality: N/A;  Attempted Laparoscopic assisted vaginal hysterectomy.   LAPAROSCOPIC LYSIS OF ADHESIONS N/A 06/11/2012   Procedure: LAPAROSCOPIC LYSIS OF ADHESIONS;  Surgeon: Allena Katz, MD;  Location: [facility];  Service: Gynecology;  Laterality: N/A;   SALPINGOOPHORECTOMY Bilateral 06/11/2012   Procedure: SALPINGO OOPHORECTOMY;  Surgeon: Allena Katz, MD;  Location: [facility];  Service: Gynecology;  Laterality: Bilateral;   Patient Active  Problem List   Diagnosis Date Noted   Spasticity as late effect of cerebrovascular accident (CVA) 10/21/2021   Abnormal gait due to muscle weakness 10/21/2021   Nausea without vomiting    Spastic hemiparesis (HCC)    History of hypertension    Neurogenic orthostatic hypotension (HCC)    Transaminitis    Right hemiplegia (HCC)    Hemorrhagic stroke (HCC)    Leucocytosis    Essential hypertension    Dyslipidemia    Right hemiparesis (HCC)    ICH (intracerebral hemorrhage) ([place]) 12/01/2020   HYPERLIPIDEMIA 07/06/2009   OBESITY 07/06/2009    REFERRING DIAG: I61.2 (ICD-10-CM) - Nontraumatic hemorrhage of left cerebral hemisphere (HCC) I69.398,R25.2 (ICD-10-CM) - Spasticity as late effect of cerebrovascular accident (CVA) M62.81,R26.9 (ICD-10-  THERAPY DIAG:  Muscle weakness (generalized)  Unsteadiness on feet  Other abnormalities of gait and mobility  Other symptoms and signs involving the nervous system  Rationale for Evaluation and Treatment Rehabilitation  PERTINENT HISTORY: hx of R hemiplegia due to stroke in 10/22- secondary to L frontal ICH and small SAH, anxiety  PRECAUTIONS: fall, R hemi  SUBJECTIVE:  Brought her new cane in. It is a SPC with a larger base. Feeling good with it.   PAIN:  Are you having pain? No     TODAY'S TREATMENT:     GAIT: Gait pattern: step through pattern, decreased arm swing- Right, decreased step  length- Left, decreased stance time- Right, decreased hip/knee flexion- Right, decreased ankle dorsiflexion- Right, circumduction- Right, Right hip hike, Right foot flat, decreased trunk rotation, and poor foot clearance- Right Distance walked: Clinic distances  Assistive device utilized:  SPC Level of assistance:Supervision.  Pt ambulating in and out of session with SPC (with slightly larger base than the normal SPC that pt had ordered from [facility]) with pt focusing on incr hip knee flexion during swing phase and decr compensatory  circumduction. Pt reporting feeling better with this cane vs. SBQC   Therapeutic Exercise: Access Code: [email or web address] URL: [email or web address] Date: 01/13/2022 Prepared by: Janann August  Reviewed/updated HEP from prior bout of therapy. See MedBridge for further details.   Exercises - Supine Bridge with Mini Swiss Ball Between Knees  - 2 x daily - 5 x weekly - 1-2 sets - 10 reps - Supine Hip Flexion (Mirrored)  - 2 x daily - 5 x weekly - 1-2 sets - 10 reps - with RLE and 2# > 3# ankle weight lifting leg into hip flexion and stepping it in and out - Sit to Stand with Armchair  - 2 x daily - 5 x weekly - 1 sets - 10 reps - Mini Squat with Counter Support  - 2 x daily - 5 x weekly - 1-2 sets - 10 reps - with squeezing ball/pillow for hip ADD during mini squats  - Single Leg Balance with Clock Reach  - 1 x daily - 5 x weekly - 1-2 sets - 10 reps - with stance leg on RLE, with intermittent taps to counter as needed, tapping forward to 12, 9, 6:00  - Step Taps on High Step  - 1 x daily - 5 x weekly - 1-2 sets - 10 reps - Forward Step Up  - 1 x daily - 5 x weekly - 1-2 sets - 10 reps - Clamshell with Resistance  - 1 x daily - 5 x weekly - 1-2 sets - 10 reps - with use of red tband  - Supine Hip Internal and External Rotation  - 1 x daily - 5 x weekly - 1-2 sets - 10 reps - with incr focus on internal rotation   PT also performed supine hip IR stretch 3 x 30 seconds, pt reporting feeling better after this stretch. Demonstrated to pt's daughter how to perform at home.    PATIENT EDUCATION: Education details: Reviewed and updated HEP/provided new handout Person educated: Patient and pt's daughter Education method: Explanation, Demonstration, Handout, Verbal Cues Education comprehension: verbalized/demonstrated understanding     HOME EXERCISE PROGRAM: [email or web address]        GOALS: Goals reviewed with patient? Yes   SHORT TERM GOALS: Target date: 01/06/2022   Pt will improve gait  speed with LRAD to at least 1.80 ft/sec in order to demo decr fall risk Baseline:21.22 seconds = 1.54 ft/sec with SBQC; 20 seconds = 1.64 ft/sec on 01/12/22 Goal status: NOT MET  2.  Pt will improve condition 4 of mCTSIB to at least 30 seconds in order to demo improved vestibular input for balance.  Baseline: 23 seconds Goal status: INITIAL   3.  Pt will undergo further assessment of balance test (DGI) with LTG written. Baseline:  Goal status: DEFERRED   4.  Pt will improve TUG time to 18 seconds or less with LRAD in order to demo decrease fall risk. Baseline: 20.15 seconds with SBQC; 15.4 seconds on 01/12/22 Goal status: MET   5.  Pt will improve 30  second chair stand to at least 12 sit <> stands in order to demo improved functional strength with no UE support  Baseline: 11 sit <> stands; 12 sit <> stands on 01/02/22 Goal status: MET     LONG TERM GOALS: Target date: 02/03/2022   Pt will be independent with final HEP in order to build upon functional gains made in therapy. Baseline:  Goal status: INITIAL   2.  DGI goal to be written as appropriate.  Baseline:  Goal status: INITIAL   3.  Pt will improve TUG time to 14.5 seconds or less with LRAD in order to demo decrease fall risk. Baseline: 20.15 seconds with SBQC Goal status: REVISED   4.  Pt will ambulate outdoors at least 300' over unlevel surfaces with LRAD and supervision in order to demo improved community mobility Baseline:  Goal status: INITIAL   5.  Pt will improve gait speed with LRAD to at least 2.0 ft/sec in order to demo improved community mobility. Baseline:  Goal status: INITIAL   ASSESSMENT:   CLINICAL IMPRESSION: Today's skilled session focused on reviewing and updating pt's HEP for strength, ROM, and balance for SLS. Pt tolerated well with updates and revisions. Will continue to progress towards LTGs.    OBJECTIVE IMPAIRMENTS Abnormal gait, decreased activity tolerance, decreased balance, decreased  coordination, decreased endurance, decreased mobility, difficulty walking, decreased ROM, decreased strength, impaired flexibility, impaired sensation, impaired tone, and postural dysfunction.    ACTIVITY LIMITATIONS carrying, lifting, bending, and locomotion level   PARTICIPATION LIMITATIONS: driving, community activity, and yard work   PERSONAL FACTORS Behavior pattern, Past/current experiences, Time since onset of injury/illness/exacerbation, and 1-2 comorbidities: anxiety  are also affecting patient's functional outcome.    REHAB POTENTIAL: Good   CLINICAL DECISION MAKING: Stable/uncomplicated   EVALUATION COMPLEXITY: Low   PLAN: PT FREQUENCY: 1-2x/week   PT DURATION: 12 weeks   PLANNED INTERVENTIONS: Therapeutic exercises, Therapeutic activity, Neuromuscular re-education, Balance training, Gait training, Patient/Family education, Self Care, Joint mobilization, Stair training, Vestibular training, Orthotic/Fit training, DME instructions, Aquatic Therapy, Electrical stimulation, Manual therapy, and Re-evaluation   PLAN FOR NEXT SESSION: *add balance on compliant surfaces to HEP. Perform step ups.  Continue Bioness for R ant tib/hamstring and work on exercises/gait-might be helpful w/ adv/retreat w/ RLE in stance. Work on RLE strengthening, balance, weight shifting to R. Gait training and improving quality of gait.   Arliss Journey, PT, DPT 01/13/2022, 11:55 AM

## 2022-01-18 ENCOUNTER — Ambulatory Visit: Payer: 59 | Attending: Physical Medicine and Rehabilitation | Admitting: Physical Therapy

## 2022-01-18 ENCOUNTER — Encounter: Payer: Self-pay | Admitting: Physical Therapy

## 2022-01-18 DIAGNOSIS — R2689 Other abnormalities of gait and mobility: Secondary | ICD-10-CM | POA: Insufficient documentation

## 2022-01-18 DIAGNOSIS — R278 Other lack of coordination: Secondary | ICD-10-CM | POA: Insufficient documentation

## 2022-01-18 DIAGNOSIS — M6281 Muscle weakness (generalized): Secondary | ICD-10-CM | POA: Insufficient documentation

## 2022-01-18 DIAGNOSIS — R29818 Other symptoms and signs involving the nervous system: Secondary | ICD-10-CM | POA: Insufficient documentation

## 2022-01-18 DIAGNOSIS — R2681 Unsteadiness on feet: Secondary | ICD-10-CM | POA: Diagnosis not present

## 2022-01-18 NOTE — Therapy (Signed)
OUTPATIENT PHYSICAL THERAPY TREATMENT NOTE   Patient Name: Felicia Chen MRN: [id number] DOB:Jan 16, 1966, 56 y.o., female Today's Date: 01/18/2022  PCP: Aretta Nip, MD REFERRING PROVIDER: Courtney Heys, MD  END OF SESSION:   PT End of Session - 01/18/22 1021     Visit Number 10    Number of Visits 21    Date for PT Re-Evaluation 03/09/22    Authorization Type UHC, VL:40, 20 USED    Authorization - Visit Number 9    Authorization - Number of Visits 20    PT Start Time 1017    PT Stop Time 1058    PT Time Calculation (min) 41 min    Equipment Utilized During Treatment Gait belt    Activity Tolerance Patient tolerated treatment well    Behavior During Therapy WFL for tasks assessed/performed;Anxious              Past Medical History:  Diagnosis Date   Anxiety    Gilbert's syndrome    Past Surgical History:  Procedure Laterality Date   ABDOMINAL HYSTERECTOMY N/A 06/11/2012   Procedure: HYSTERECTOMY ABDOMINAL;  Surgeon: Allena Katz, MD;  Location: [facility];  Service: Gynecology;  Laterality: N/A;   CESAREAN SECTION     x 4   CYSTOSCOPY N/A 06/11/2012   Procedure: CYSTOSCOPY;  Surgeon: Allena Katz, MD;  Location: [facility];  Service: Gynecology;  Laterality: N/A;   HERNIA REPAIR     LAPAROSCOPIC ASSISTED VAGINAL HYSTERECTOMY N/A 06/11/2012   Procedure: LAPAROSCOPIC ASSISTED VAGINAL HYSTERECTOMY atttempted;  Surgeon: Allena Katz, MD;  Location: [facility];  Service: Gynecology;  Laterality: N/A;  Attempted Laparoscopic assisted vaginal hysterectomy.   LAPAROSCOPIC LYSIS OF ADHESIONS N/A 06/11/2012   Procedure: LAPAROSCOPIC LYSIS OF ADHESIONS;  Surgeon: Allena Katz, MD;  Location: [facility];  Service: Gynecology;  Laterality: N/A;   SALPINGOOPHORECTOMY Bilateral 06/11/2012   Procedure: SALPINGO OOPHORECTOMY;  Surgeon: Allena Katz, MD;  Location: [facility];  Service: Gynecology;  Laterality: Bilateral;   Patient Active Problem List    Diagnosis Date Noted   Spasticity as late effect of cerebrovascular accident (CVA) 10/21/2021   Abnormal gait due to muscle weakness 10/21/2021   Nausea without vomiting    Spastic hemiparesis (HCC)    History of hypertension    Neurogenic orthostatic hypotension (HCC)    Transaminitis    Right hemiplegia (HCC)    Hemorrhagic stroke (HCC)    Leucocytosis    Essential hypertension    Dyslipidemia    Right hemiparesis (HCC)    ICH (intracerebral hemorrhage) ([place]) 12/01/2020   HYPERLIPIDEMIA 07/06/2009   OBESITY 07/06/2009    REFERRING DIAG: I61.2 (ICD-10-CM) - Nontraumatic hemorrhage of left cerebral hemisphere (HCC) I69.398,R25.2 (ICD-10-CM) - Spasticity as late effect of cerebrovascular accident (CVA) M62.81,R26.9 (ICD-10-  THERAPY DIAG:  Unsteadiness on feet  Muscle weakness (generalized)  Other abnormalities of gait and mobility  Other symptoms and signs involving the nervous system  Rationale for Evaluation and Treatment Rehabilitation  PERTINENT HISTORY: hx of R hemiplegia due to stroke in 10/22- secondary to L frontal ICH and small SAH, anxiety  PRECAUTIONS: fall, R hemi  SUBJECTIVE:  Doing well. Wants to review the hip flexion exercise. Still figuring out anxiety meds. Had a slip the other day, but did not fall.   PAIN:  Are you having pain? No     TODAY'S TREATMENT:     GAIT: Gait pattern: step through pattern, decreased arm swing- Right,  decreased step length- Left, decreased stance time- Right, decreased hip/knee flexion- Right, decreased ankle dorsiflexion- Right, circumduction- Right, Right hip hike, Right foot flat, decreased trunk rotation, and poor foot clearance- Right Distance walked: Clinic distances  Assistive device utilized:  SPC Level of assistance:Supervision.  Pt ambulating in and out of session with SPC (with slightly larger base than the normal SPC that pt had ordered from [facility]) with pt focusing on incr hip knee flexion during swing  phase and decr compensatory circumduction. Pt reporting having to think more and be more challenged with this cane. Discussed that pt should feel stable with cane, so showed pt where to purchase [facility] for quad tip off [place] for incr stability. Pt plans on ordering one of these as well for home to use as needed when having a more off balance day.    NMR:   Access Code: [email or web address] URL: [email or web address] Date: 01/13/2022 Prepared by: Janann August  Reviewed/progressed exercises below to pt's HEP (per pt request).   Exercises - Supine Bridge with Mini Swiss Ball Between Knees  - 2 x daily - 5 x weekly - 1-2 sets - 10 reps - Supine Hip Flexion (Mirrored)  - 2 x daily - 5 x weekly - 1-2 sets - 10 reps - reviewed technique with 3# ankle weight, with lifting into hip flexion and then stepping out to side, cues for slowed pace  - Sit to Stand with Armchair  - 2 x daily - 5 x weekly - 1 sets - 10 reps - Mini Squat with Counter Support  - 2 x daily - 5 x weekly - 1-2 sets - 10 reps - with squeezing ball/pillow for hip ADD during mini squats  - Single Leg Balance with Clock Reach  - 1 x daily - 5 x weekly - 1-2 sets - 10 reps - with stance leg on RLE, with intermittent taps to counter as needed, tapping forward to 12, 9, 6:00  - Step Taps on High Step  - 1 x daily - 5 x weekly - 1-2 sets - 10 reps - with RLE as stance leg, tapping LLE to 6" step with no UE support x10 reps, then first and 2nd step for improved SLS time x10 reps. Discussed having someone with pt at home when performing.  - Forward Step Up  - 1 x daily - 5 x weekly - 1-2 sets - 10 reps - with RLE, progressed to floating LLE as stance leg x10 reps, performed with UE support.  - Clamshell with Resistance  - 1 x daily - 5 x weekly - 1-2 sets - 10 reps - with use of red tband  - Supine Hip Internal and External Rotation  - 1 x daily - 5 x weekly - 1-2 sets - 10 reps - with incr focus on internal rotation    Standing on air ex  with feet apart EO: 10 reps head turns, 10 head head nods. Cued for incr weight bearing through medial R foot as pt with tendency for incr weight shift on lateral border of foot.   PATIENT EDUCATION: Education details: Reviewed exercises on HEP per pt request  Person educated: Patient and pt's daughter Education method: Explanation, Demonstration, Handout, Verbal Cues Education comprehension: verbalized/demonstrated understanding     HOME EXERCISE PROGRAM: [email or web address]        GOALS: Goals reviewed with patient? Yes   SHORT TERM GOALS: Target date: 01/06/2022   Pt will improve gait speed with LRAD to at least 1.80  ft/sec in order to demo decr fall risk Baseline:21.22 seconds = 1.54 ft/sec with SBQC; 20 seconds = 1.64 ft/sec on 01/12/22 Goal status: NOT MET  2.  Pt will improve condition 4 of mCTSIB to at least 30 seconds in order to demo improved vestibular input for balance.  Baseline: 23 seconds Goal status: INITIAL   3.  Pt will undergo further assessment of balance test (DGI) with LTG written. Baseline:  Goal status: DEFERRED   4.  Pt will improve TUG time to 18 seconds or less with LRAD in order to demo decrease fall risk. Baseline: 20.15 seconds with SBQC; 15.4 seconds on 01/12/22 Goal status: MET   5.  Pt will improve 30 second chair stand to at least 12 sit <> stands in order to demo improved functional strength with no UE support  Baseline: 11 sit <> stands; 12 sit <> stands on 01/02/22 Goal status: MET     LONG TERM GOALS: Target date: 02/03/2022   Pt will be independent with final HEP in order to build upon functional gains made in therapy. Baseline:  Goal status: INITIAL   2.  DGI goal to be written as appropriate.  Baseline:  Goal status: INITIAL   3.  Pt will improve TUG time to 14.5 seconds or less with LRAD in order to demo decrease fall risk. Baseline: 20.15 seconds with SBQC Goal status: REVISED   4.  Pt will ambulate outdoors at least 300' over  unlevel surfaces with LRAD and supervision in order to demo improved community mobility Baseline:  Goal status: INITIAL   5.  Pt will improve gait speed with LRAD to at least 2.0 ft/sec in order to demo improved community mobility. Baseline:  Goal status: INITIAL   ASSESSMENT:   CLINICAL IMPRESSION: Today's skilled session focused on balance and reviewing remainder of exercises on HEP with step ups and SLS tasks and progressing as appropriate. Pt initially with more anxiety with SLS when taking UE support away, but improved with incr reps. Will continue to progress towards LTGs.    OBJECTIVE IMPAIRMENTS Abnormal gait, decreased activity tolerance, decreased balance, decreased coordination, decreased endurance, decreased mobility, difficulty walking, decreased ROM, decreased strength, impaired flexibility, impaired sensation, impaired tone, and postural dysfunction.    ACTIVITY LIMITATIONS carrying, lifting, bending, and locomotion level   PARTICIPATION LIMITATIONS: driving, community activity, and yard work   PERSONAL FACTORS Behavior pattern, Past/current experiences, Time since onset of injury/illness/exacerbation, and 1-2 comorbidities: anxiety  are also affecting patient's functional outcome.    REHAB POTENTIAL: Good   CLINICAL DECISION MAKING: Stable/uncomplicated   EVALUATION COMPLEXITY: Low   PLAN: PT FREQUENCY: 1-2x/week   PT DURATION: 12 weeks   PLANNED INTERVENTIONS: Therapeutic exercises, Therapeutic activity, Neuromuscular re-education, Balance training, Gait training, Patient/Family education, Self Care, Joint mobilization, Stair training, Vestibular training, Orthotic/Fit training, DME instructions, Aquatic Therapy, Electrical stimulation, Manual therapy, and Re-evaluation   PLAN FOR NEXT SESSION: Continue Bioness for R ant tib/hamstring and work on exercises/gait-might be helpful w/ adv/retreat w/ RLE in stance. Work on RLE strengthening, balance, weight shifting to  R. Gait training and improving quality of gait.   Arliss Journey, PT, DPT 01/18/2022, 12:04 PM

## 2022-01-20 ENCOUNTER — Ambulatory Visit: Payer: 59 | Admitting: Physical Therapy

## 2022-01-20 ENCOUNTER — Encounter: Payer: Self-pay | Admitting: Physical Therapy

## 2022-01-20 DIAGNOSIS — R29818 Other symptoms and signs involving the nervous system: Secondary | ICD-10-CM

## 2022-01-20 DIAGNOSIS — M6281 Muscle weakness (generalized): Secondary | ICD-10-CM

## 2022-01-20 DIAGNOSIS — R2681 Unsteadiness on feet: Secondary | ICD-10-CM | POA: Diagnosis not present

## 2022-01-20 DIAGNOSIS — R2689 Other abnormalities of gait and mobility: Secondary | ICD-10-CM

## 2022-01-20 NOTE — Therapy (Signed)
OUTPATIENT PHYSICAL THERAPY TREATMENT NOTE   Patient Name: Felicia Chen MRN: [id number] DOB:1965/08/16, 56 y.o., female Today's Date: 01/20/2022  PCP: Aretta Nip, MD REFERRING PROVIDER: Courtney Heys, MD  END OF SESSION:   PT End of Session - 01/20/22 1026     Visit Number 11    Number of Visits 21    Date for PT Re-Evaluation 03/09/22    Authorization Type UHC, VL:40, 17 USED    Authorization - Visit Number 10    Authorization - Number of Visits 20    PT Start Time 1024   PT running late   PT Stop Time 1103    PT Time Calculation (min) 39 min    Equipment Utilized During Treatment Gait belt    Activity Tolerance Patient tolerated treatment well    Behavior During Therapy WFL for tasks assessed/performed;Anxious              Past Medical History:  Diagnosis Date   Anxiety    Gilbert's syndrome    Past Surgical History:  Procedure Laterality Date   ABDOMINAL HYSTERECTOMY N/A 06/11/2012   Procedure: HYSTERECTOMY ABDOMINAL;  Surgeon: Allena Katz, MD;  Location: [facility];  Service: Gynecology;  Laterality: N/A;   CESAREAN SECTION     x 4   CYSTOSCOPY N/A 06/11/2012   Procedure: CYSTOSCOPY;  Surgeon: Allena Katz, MD;  Location: [facility];  Service: Gynecology;  Laterality: N/A;   HERNIA REPAIR     LAPAROSCOPIC ASSISTED VAGINAL HYSTERECTOMY N/A 06/11/2012   Procedure: LAPAROSCOPIC ASSISTED VAGINAL HYSTERECTOMY atttempted;  Surgeon: Allena Katz, MD;  Location: [facility];  Service: Gynecology;  Laterality: N/A;  Attempted Laparoscopic assisted vaginal hysterectomy.   LAPAROSCOPIC LYSIS OF ADHESIONS N/A 06/11/2012   Procedure: LAPAROSCOPIC LYSIS OF ADHESIONS;  Surgeon: Allena Katz, MD;  Location: [facility];  Service: Gynecology;  Laterality: N/A;   SALPINGOOPHORECTOMY Bilateral 06/11/2012   Procedure: SALPINGO OOPHORECTOMY;  Surgeon: Allena Katz, MD;  Location: [facility];  Service: Gynecology;  Laterality: Bilateral;   Patient Active  Problem List   Diagnosis Date Noted   Spasticity as late effect of cerebrovascular accident (CVA) 10/21/2021   Abnormal gait due to muscle weakness 10/21/2021   Nausea without vomiting    Spastic hemiparesis (HCC)    History of hypertension    Neurogenic orthostatic hypotension (HCC)    Transaminitis    Right hemiplegia (HCC)    Hemorrhagic stroke (HCC)    Leucocytosis    Essential hypertension    Dyslipidemia    Right hemiparesis (HCC)    ICH (intracerebral hemorrhage) ([place]) 12/01/2020   HYPERLIPIDEMIA 07/06/2009   OBESITY 07/06/2009    REFERRING DIAG: I61.2 (ICD-10-CM) - Nontraumatic hemorrhage of left cerebral hemisphere (HCC) I69.398,R25.2 (ICD-10-CM) - Spasticity as late effect of cerebrovascular accident (CVA) M62.81,R26.9 (ICD-10-  THERAPY DIAG:  Unsteadiness on feet  Muscle weakness (generalized)  Other abnormalities of gait and mobility  Other symptoms and signs involving the nervous system  Rationale for Evaluation and Treatment Rehabilitation  PERTINENT HISTORY: hx of R hemiplegia due to stroke in 10/22- secondary to L frontal ICH and small SAH, anxiety  PRECAUTIONS: fall, R hemi  SUBJECTIVE:  Doing well. Did some exercise on the treadmill and that went well. Has been doing the new exercises.   PAIN:  Are you having pain? No     TODAY'S TREATMENT:     GAIT: Gait pattern: step through pattern, decreased arm swing- Right, decreased step  length- Left, decreased stance time- Right, decreased hip/knee flexion- Right, decreased ankle dorsiflexion- Right, circumduction- Right, Right hip hike, Right foot flat, decreased trunk rotation, and poor foot clearance- Right Distance walked: Clinic distances  Assistive device utilized:  SPC Level of assistance:Supervision.  Pt ambulating in and out of session with SPC (with slightly larger base than the normal SPC)  PT was planning on using Bioness during session, but lower cuff was not charged, so was unable to set  up and use.   NMR:   On rockerboard in A/P direction: -With RLE as stance leg, stepping LLE on and off x10 reps without UE support  -SLS taps to forward cone with RLE as stance leg x15 reps with UE support > none and then forward/cross body tap x15 reps, pt more challenged with this and needing frequent UE support  -Keeping balance and ball toss with pt's daughter x20 reps with progressing to PT performing random perturbations and pt needing to keep balance, intermittent min guard as needed, otherwise pt able to maintain with ankle/hip strategy.  On blue foam beam: -With RLE as stance leg; lateral stepping with LLE x10 reps and then stepping over obstacle for incr SLS time x12 reps, with no UE support  -With wide BOS - reaching across body with LUE or weight shifting and reaching with RUE to grab bean bag and then alternating stepping off foam with either R/L leg and tossing into crate, performed x12 reps. Pt alternating throwing between R/L hand   PATIENT EDUCATION: Education details: Continue HEP, pt to bring electrodes to next session to trial for ant tib  Person educated: Patient and pt's daughter Education method: Explanation, Demonstration, Handout, Verbal Cues Education comprehension: verbalized/demonstrated understanding     HOME EXERCISE PROGRAM: [email or web address]        GOALS: Goals reviewed with patient? Yes   SHORT TERM GOALS: Target date: 01/06/2022   Pt will improve gait speed with LRAD to at least 1.80 ft/sec in order to demo decr fall risk Baseline:21.22 seconds = 1.54 ft/sec with SBQC; 20 seconds = 1.64 ft/sec on 01/12/22 Goal status: NOT MET  2.  Pt will improve condition 4 of mCTSIB to at least 30 seconds in order to demo improved vestibular input for balance.  Baseline: 23 seconds Goal status: INITIAL   3.  Pt will undergo further assessment of balance test (DGI) with LTG written. Baseline:  Goal status: DEFERRED   4.  Pt will improve TUG time to 18 seconds or less  with LRAD in order to demo decrease fall risk. Baseline: 20.15 seconds with SBQC; 15.4 seconds on 01/12/22 Goal status: MET   5.  Pt will improve 30 second chair stand to at least 12 sit <> stands in order to demo improved functional strength with no UE support  Baseline: 11 sit <> stands; 12 sit <> stands on 01/02/22 Goal status: MET     LONG TERM GOALS: Target date: 02/03/2022   Pt will be independent with final HEP in order to build upon functional gains made in therapy. Baseline:  Goal status: INITIAL   2.  DGI goal to be written as appropriate.  Baseline:  Goal status: INITIAL   3.  Pt will improve TUG time to 14.5 seconds or less with LRAD in order to demo decrease fall risk. Baseline: 20.15 seconds with SBQC Goal status: REVISED   4.  Pt will ambulate outdoors at least 300' over unlevel surfaces with LRAD and supervision in order to demo improved  community mobility Baseline:  Goal status: INITIAL   5.  Pt will improve gait speed with LRAD to at least 2.0 ft/sec in order to demo improved community mobility. Baseline:  Goal status: INITIAL   ASSESSMENT:   CLINICAL IMPRESSION: Was initially planning to use Bioness during session, but lower cuff was not fully charged, so was unable to use. Instead focused on standing balance strategies on compliant surfaces with incr RLE weight shifting. Pt initially more fearful standing on RLE without UE support, but improved with incr reps. Pt tolerated session well, will continue to progress towards LTGs.    OBJECTIVE IMPAIRMENTS Abnormal gait, decreased activity tolerance, decreased balance, decreased coordination, decreased endurance, decreased mobility, difficulty walking, decreased ROM, decreased strength, impaired flexibility, impaired sensation, impaired tone, and postural dysfunction.    ACTIVITY LIMITATIONS carrying, lifting, bending, and locomotion level   PARTICIPATION LIMITATIONS: driving, community activity, and yard work    PERSONAL FACTORS Behavior pattern, Past/current experiences, Time since onset of injury/illness/exacerbation, and 1-2 comorbidities: anxiety  are also affecting patient's functional outcome.    REHAB POTENTIAL: Good   CLINICAL DECISION MAKING: Stable/uncomplicated   EVALUATION COMPLEXITY: Low   PLAN: PT FREQUENCY: 1-2x/week   PT DURATION: 12 weeks   PLANNED INTERVENTIONS: Therapeutic exercises, Therapeutic activity, Neuromuscular re-education, Balance training, Gait training, Patient/Family education, Self Care, Joint mobilization, Stair training, Vestibular training, Orthotic/Fit training, DME instructions, Aquatic Therapy, Electrical stimulation, Manual therapy, and Re-evaluation   PLAN FOR NEXT SESSION: Pt planning on bringing her own electrodes from home. Continue Bioness for R ant tib/hamstring and work on exercises/gait-might be helpful w/ adv/retreat w/ RLE in stance. Work on RLE strengthening, balance, weight shifting to R. Gait training and improving quality of gait.   Arliss Journey, PT, DPT 01/20/2022, 12:11 PM

## 2022-01-23 ENCOUNTER — Ambulatory Visit: Payer: 59

## 2022-01-23 DIAGNOSIS — M6281 Muscle weakness (generalized): Secondary | ICD-10-CM

## 2022-01-23 DIAGNOSIS — R29818 Other symptoms and signs involving the nervous system: Secondary | ICD-10-CM

## 2022-01-23 DIAGNOSIS — R2681 Unsteadiness on feet: Secondary | ICD-10-CM | POA: Diagnosis not present

## 2022-01-23 DIAGNOSIS — R2689 Other abnormalities of gait and mobility: Secondary | ICD-10-CM

## 2022-01-23 DIAGNOSIS — R278 Other lack of coordination: Secondary | ICD-10-CM

## 2022-01-23 NOTE — Therapy (Signed)
OUTPATIENT PHYSICAL THERAPY TREATMENT NOTE   Patient Name: Felicia Chen MRN: [id number] DOB:10-25-1965, 56 y.o., female Today's Date: 01/23/2022  PCP: Aretta Nip, MD REFERRING PROVIDER: Courtney Heys, MD  END OF SESSION:   PT End of Session - 01/23/22 1324     Visit Number 12    Number of Visits 21    Date for PT Re-Evaluation 03/09/22    Authorization Type UHC, VL:40, 20 USED    Authorization - Visit Number 10    Authorization - Number of Visits 20    PT Start Time 9485    PT Stop Time 1400    PT Time Calculation (min) 43 min    Equipment Utilized During Treatment Gait belt    Activity Tolerance Patient tolerated treatment well    Behavior During Therapy WFL for tasks assessed/performed;Anxious              Past Medical History:  Diagnosis Date   Anxiety    Gilbert's syndrome    Past Surgical History:  Procedure Laterality Date   ABDOMINAL HYSTERECTOMY N/A 06/11/2012   Procedure: HYSTERECTOMY ABDOMINAL;  Surgeon: Allena Katz, MD;  Location: [facility];  Service: Gynecology;  Laterality: N/A;   CESAREAN SECTION     x 4   CYSTOSCOPY N/A 06/11/2012   Procedure: CYSTOSCOPY;  Surgeon: Allena Katz, MD;  Location: [facility];  Service: Gynecology;  Laterality: N/A;   HERNIA REPAIR     LAPAROSCOPIC ASSISTED VAGINAL HYSTERECTOMY N/A 06/11/2012   Procedure: LAPAROSCOPIC ASSISTED VAGINAL HYSTERECTOMY atttempted;  Surgeon: Allena Katz, MD;  Location: [facility];  Service: Gynecology;  Laterality: N/A;  Attempted Laparoscopic assisted vaginal hysterectomy.   LAPAROSCOPIC LYSIS OF ADHESIONS N/A 06/11/2012   Procedure: LAPAROSCOPIC LYSIS OF ADHESIONS;  Surgeon: Allena Katz, MD;  Location: [facility];  Service: Gynecology;  Laterality: N/A;   SALPINGOOPHORECTOMY Bilateral 06/11/2012   Procedure: SALPINGO OOPHORECTOMY;  Surgeon: Allena Katz, MD;  Location: [facility];  Service: Gynecology;  Laterality: Bilateral;   Patient Active Problem List    Diagnosis Date Noted   Spasticity as late effect of cerebrovascular accident (CVA) 10/21/2021   Abnormal gait due to muscle weakness 10/21/2021   Nausea without vomiting    Spastic hemiparesis (HCC)    History of hypertension    Neurogenic orthostatic hypotension (HCC)    Transaminitis    Right hemiplegia (HCC)    Hemorrhagic stroke (HCC)    Leucocytosis    Essential hypertension    Dyslipidemia    Right hemiparesis (HCC)    ICH (intracerebral hemorrhage) ([place]) 12/01/2020   HYPERLIPIDEMIA 07/06/2009   OBESITY 07/06/2009    REFERRING DIAG: I61.2 (ICD-10-CM) - Nontraumatic hemorrhage of left cerebral hemisphere (HCC) I69.398,R25.2 (ICD-10-CM) - Spasticity as late effect of cerebrovascular accident (CVA) M62.81,R26.9 (ICD-10-  THERAPY DIAG:  Unsteadiness on feet  Muscle weakness (generalized)  Other abnormalities of gait and mobility  Other symptoms and signs involving the nervous system  Other lack of coordination  Rationale for Evaluation and Treatment Rehabilitation  PERTINENT HISTORY: hx of R hemiplegia due to stroke in 10/22- secondary to L frontal ICH and small SAH, anxiety  PRECAUTIONS: fall, R hemi  SUBJECTIVE:  Patient doing well. Multiple questions regarding walking without an AD. Brought e-stim today.   PAIN:  Are you having pain? No     TODAY'S TREATMENT:     GAIT: -gait without AD; emphasis on L knee extension in stance to minimize vaulting and R  circumduction  -230' with no AD + CGA   -multimodal cuing to increase L knee extension in stance and "drive" R knee forward through swing phase    -discussed walking on the treadmill and implications of minimizing vertical excursions  NMR:   Attempted to use patients person Saebo, but was not charged  Prone hamstring curls with noted increased contraction of R glutes to compensate   PATIENT EDUCATION: Education details: Continue HEP, gait on treadmill  Person educated: Patient and pt's  daughter Education method: Explanation, Demonstration, Handout, Verbal Cues Education comprehension: verbalized/demonstrated understanding     HOME EXERCISE PROGRAM: [id number]        GOALS: Goals reviewed with patient? Yes   SHORT TERM GOALS: Target date: 01/06/2022   Pt will improve gait speed with LRAD to at least 1.80 ft/sec in order to demo decr fall risk Baseline:21.22 seconds = 1.54 ft/sec with SBQC; 20 seconds = 1.64 ft/sec on 01/12/22 Goal status: NOT MET  2.  Pt will improve condition 4 of mCTSIB to at least 30 seconds in order to demo improved vestibular input for balance.  Baseline: 23 seconds Goal status: INITIAL   3.  Pt will undergo further assessment of balance test (DGI) with LTG written. Baseline:  Goal status: DEFERRED   4.  Pt will improve TUG time to 18 seconds or less with LRAD in order to demo decrease fall risk. Baseline: 20.15 seconds with SBQC; 15.4 seconds on 01/12/22 Goal status: MET   5.  Pt will improve 30 second chair stand to at least 12 sit <> stands in order to demo improved functional strength with no UE support  Baseline: 11 sit <> stands; 12 sit <> stands on 01/02/22 Goal status: MET     LONG TERM GOALS: Target date: 02/03/2022   Pt will be independent with final HEP in order to build upon functional gains made in therapy. Baseline:  Goal status: INITIAL   2.  DGI goal to be written as appropriate.  Baseline:  Goal status: INITIAL   3.  Pt will improve TUG time to 14.5 seconds or less with LRAD in order to demo decrease fall risk. Baseline: 20.15 seconds with SBQC Goal status: REVISED   4.  Pt will ambulate outdoors at least 300' over unlevel surfaces with LRAD and supervision in order to demo improved community mobility Baseline:  Goal status: INITIAL   5.  Pt will improve gait speed with LRAD to at least 2.0 ft/sec in order to demo improved community mobility. Baseline:  Goal status: INITIAL   ASSESSMENT:   CLINICAL  IMPRESSION: Patient seen for skilled PT session with emphasis on gait retraining. Patient did bring her personal Saebo unit from home, but it was not charged, so unable to use. Discussed gait without an AD- patient safest using SPC at this time for increased balance and stability, but discussed how it does change her gait mechanics. Patient able to minimize circumduction, but does then compensate for decreased R knee flexion through swing phase by vaulting on L. Prone hamstring curls noted to be difficult with increased general R LE muscle activation, not isolated to hamstrings. Patient my benefit from powder board gait cycle training in gravity eliminated position. Continue POC.    OBJECTIVE IMPAIRMENTS Abnormal gait, decreased activity tolerance, decreased balance, decreased coordination, decreased endurance, decreased mobility, difficulty walking, decreased ROM, decreased strength, impaired flexibility, impaired sensation, impaired tone, and postural dysfunction.    ACTIVITY LIMITATIONS carrying, lifting, bending, and locomotion level  PARTICIPATION LIMITATIONS: driving, community activity, and yard work   PERSONAL FACTORS Behavior pattern, Past/current experiences, Time since onset of injury/illness/exacerbation, and 1-2 comorbidities: anxiety  are also affecting patient's functional outcome.    REHAB POTENTIAL: Good   CLINICAL DECISION MAKING: Stable/uncomplicated   EVALUATION COMPLEXITY: Low   PLAN: PT FREQUENCY: 1-2x/week   PT DURATION: 12 weeks   PLANNED INTERVENTIONS: Therapeutic exercises, Therapeutic activity, Neuromuscular re-education, Balance training, Gait training, Patient/Family education, Self Care, Joint mobilization, Stair training, Vestibular training, Orthotic/Fit training, DME instructions, Aquatic Therapy, Electrical stimulation, Manual therapy, and Re-evaluation   PLAN FOR NEXT SESSION: Pt planning on bringing her own electrodes from home. Continue Bioness for R ant  tib/hamstring and work on exercises/gait-might be helpful w/ adv/retreat w/ RLE in stance. Work on RLE strengthening, balance, weight shifting to R. Gait training and improving quality of gait, powderboard gait cycle  Debbora Dus, PT, DPT Debbora Dus, PT, DPT, CBIS  01/23/2022, 2:52 PM

## 2022-01-25 ENCOUNTER — Encounter: Payer: Self-pay | Admitting: Physical Therapy

## 2022-01-25 ENCOUNTER — Ambulatory Visit: Payer: 59 | Admitting: Physical Therapy

## 2022-01-25 DIAGNOSIS — R29818 Other symptoms and signs involving the nervous system: Secondary | ICD-10-CM

## 2022-01-25 DIAGNOSIS — R2681 Unsteadiness on feet: Secondary | ICD-10-CM | POA: Diagnosis not present

## 2022-01-25 DIAGNOSIS — M6281 Muscle weakness (generalized): Secondary | ICD-10-CM

## 2022-01-25 DIAGNOSIS — R2689 Other abnormalities of gait and mobility: Secondary | ICD-10-CM

## 2022-01-25 NOTE — Therapy (Signed)
OUTPATIENT PHYSICAL THERAPY TREATMENT NOTE   Patient Name: Felicia Chen MRN: [id number] DOB:03-05-1966, 56 y.o., female Today's Date: 01/25/2022  PCP: Aretta Nip, MD REFERRING PROVIDER: Courtney Heys, MD  END OF SESSION:   PT End of Session - 01/25/22 1536     Visit Number 13    Number of Visits 21    Date for PT Re-Evaluation 03/09/22    Authorization Type UHC, VL:40, 18 USED    Authorization - Visit Number 10    Authorization - Number of Visits 20    PT Start Time 1532    PT Stop Time 1615    PT Time Calculation (min) 43 min    Equipment Utilized During Treatment Gait belt    Activity Tolerance Patient tolerated treatment well    Behavior During Therapy WFL for tasks assessed/performed;Anxious              Past Medical History:  Diagnosis Date   Anxiety    Gilbert's syndrome    Past Surgical History:  Procedure Laterality Date   ABDOMINAL HYSTERECTOMY N/A 06/11/2012   Procedure: HYSTERECTOMY ABDOMINAL;  Surgeon: Allena Katz, MD;  Location: [facility];  Service: Gynecology;  Laterality: N/A;   CESAREAN SECTION     x 4   CYSTOSCOPY N/A 06/11/2012   Procedure: CYSTOSCOPY;  Surgeon: Allena Katz, MD;  Location: [facility];  Service: Gynecology;  Laterality: N/A;   HERNIA REPAIR     LAPAROSCOPIC ASSISTED VAGINAL HYSTERECTOMY N/A 06/11/2012   Procedure: LAPAROSCOPIC ASSISTED VAGINAL HYSTERECTOMY atttempted;  Surgeon: Allena Katz, MD;  Location: [facility];  Service: Gynecology;  Laterality: N/A;  Attempted Laparoscopic assisted vaginal hysterectomy.   LAPAROSCOPIC LYSIS OF ADHESIONS N/A 06/11/2012   Procedure: LAPAROSCOPIC LYSIS OF ADHESIONS;  Surgeon: Allena Katz, MD;  Location: [facility];  Service: Gynecology;  Laterality: N/A;   SALPINGOOPHORECTOMY Bilateral 06/11/2012   Procedure: SALPINGO OOPHORECTOMY;  Surgeon: Allena Katz, MD;  Location: [facility];  Service: Gynecology;  Laterality: Bilateral;   Patient Active Problem List    Diagnosis Date Noted   Spasticity as late effect of cerebrovascular accident (CVA) 10/21/2021   Abnormal gait due to muscle weakness 10/21/2021   Nausea without vomiting    Spastic hemiparesis (HCC)    History of hypertension    Neurogenic orthostatic hypotension (HCC)    Transaminitis    Right hemiplegia (HCC)    Hemorrhagic stroke (HCC)    Leucocytosis    Essential hypertension    Dyslipidemia    Right hemiparesis (HCC)    ICH (intracerebral hemorrhage) ([place]) 12/01/2020   HYPERLIPIDEMIA 07/06/2009   OBESITY 07/06/2009    REFERRING DIAG: I61.2 (ICD-10-CM) - Nontraumatic hemorrhage of left cerebral hemisphere (HCC) I69.398,R25.2 (ICD-10-CM) - Spasticity as late effect of cerebrovascular accident (CVA) M62.81,R26.9 (ICD-10-  THERAPY DIAG:  Unsteadiness on feet  Muscle weakness (generalized)  Other abnormalities of gait and mobility  Other symptoms and signs involving the nervous system  Rationale for Evaluation and Treatment Rehabilitation  PERTINENT HISTORY: hx of R hemiplegia due to stroke in 10/22- secondary to L frontal ICH and small SAH, anxiety  PRECAUTIONS: fall, R hemi  SUBJECTIVE:  Pt is fatigued this afternoon as she has gone to [employer] and [employer].  Nervous about upcoming trip to [place], [place] to see daughter without her husband.  She is worried she is going to be too tired today.  PAIN:  Are you having pain? No   TODAY'S TREATMENT:  Onset  of session spent discussing managing going into open public spaces again due to pt subjective, managing anxiety related to holding up walking traffic, and problem solving when to jump into flow of walking traffic and navigating busy spaces.    GAIT: Gait pattern: step to pattern, decreased arm swing- Right, decreased arm swing- Left, decreased step length- Left, decreased stance time- Right, decreased stride length, decreased hip/knee flexion- Right, decreased ankle dorsiflexion- Right, genu recurvatum- Right, and poor  foot clearance- Right Distance walked: 230' Assistive device utilized: None Level of assistance: SBA and CGA Comments: Performed as dynamic warmup.  Pt is very anxious when therapist is not holding gait belt.  PT encourages inc stance time on RLE w/ time spent providing edu on feeling weight traverse from heel to toe with progression of pushing toe down into ground as able during terminal stance.  Pt tends to achieve just beyond midstance then shifts weight completely to left shortening stance time on RLE.  At counter pt practices stride weight shift w/ RLE in front, limits of counter prevent achieving terminal stance so ambulated to // bars where activity continued.  Pt progresses from BUE support to none focusing on very large steps to force anterior translation of weight and achieve terminal stance w/ PT facilitating right knee bend and pressure through foot during pre-swing.    In // bars pt continues lunging style activity to force right knee bend and pushing through right toe.  Cued to engage calf muscle fully in attempt to rely on active insufficiency in order to push toe down and unlock knee w/ minimal improvement.  PT sets up bench behind pt in // bars using black mobilizing theraband anchored to chair w/ weight to perform spanish squats x20 allowing pt to sink farther into symmetrical squat using mirror feedback.  Pt reports feeling more comfortable with this than existing HEP squat so provided band for home use, problem solved setup with pt and daughter.  Edu that she should feel this in hamstrings and gluts.  PATIENT EDUCATION: Education details: Continue HEP, gait on treadmill w/ focus on increased right stance.  Edu provided on weight traversing from heel-to-toe and supination > pronation activity in foot during phases on gait. Person educated: Patient and pt's daughter Education method: Explanation, Demonstration, Handout, Verbal Cues Education comprehension: verbalized/demonstrated  understanding     HOME EXERCISE PROGRAM: [id number]        GOALS: Goals reviewed with patient? Yes   SHORT TERM GOALS: Target date: 01/06/2022   Pt will improve gait speed with LRAD to at least 1.80 ft/sec in order to demo decr fall risk Baseline:21.22 seconds = 1.54 ft/sec with SBQC; 20 seconds = 1.64 ft/sec on 01/12/22 Goal status: NOT MET  2.  Pt will improve condition 4 of mCTSIB to at least 30 seconds in order to demo improved vestibular input for balance.  Baseline: 23 seconds Goal status: INITIAL   3.  Pt will undergo further assessment of balance test (DGI) with LTG written. Baseline:  Goal status: DEFERRED   4.  Pt will improve TUG time to 18 seconds or less with LRAD in order to demo decrease fall risk. Baseline: 20.15 seconds with SBQC; 15.4 seconds on 01/12/22 Goal status: MET   5.  Pt will improve 30 second chair stand to at least 12 sit <> stands in order to demo improved functional strength with no UE support  Baseline: 11 sit <> stands; 12 sit <> stands on 01/02/22 Goal status: MET  LONG TERM GOALS: Target date: 02/03/2022   Pt will be independent with final HEP in order to build upon functional gains made in therapy. Baseline:  Goal status: INITIAL   2.  DGI goal to be written as appropriate.  Baseline:  Goal status: INITIAL   3.  Pt will improve TUG time to 14.5 seconds or less with LRAD in order to demo decrease fall risk. Baseline: 20.15 seconds with SBQC Goal status: REVISED   4.  Pt will ambulate outdoors at least 300' over unlevel surfaces with LRAD and supervision in order to demo improved community mobility Baseline:  Goal status: INITIAL   5.  Pt will improve gait speed with LRAD to at least 2.0 ft/sec in order to demo improved community mobility. Baseline:  Goal status: INITIAL   ASSESSMENT:   CLINICAL IMPRESSION: Focus of skilled session today on NMR style work to address prolonged stance on right LE and problem solve terminal  stance and unlocking knee to improve overall mechanics.  Pt tolerates spanish squats well this session demonstrating good assisted control and symmetrical weight bearing during task to fully engage gluts and hamstrings.  She continue to require skilled PT to further address hip IR, hamstring activation, decreased compensation during gait and compound movements.    OBJECTIVE IMPAIRMENTS Abnormal gait, decreased activity tolerance, decreased balance, decreased coordination, decreased endurance, decreased mobility, difficulty walking, decreased ROM, decreased strength, impaired flexibility, impaired sensation, impaired tone, and postural dysfunction.    ACTIVITY LIMITATIONS carrying, lifting, bending, and locomotion level   PARTICIPATION LIMITATIONS: driving, community activity, and yard work   PERSONAL FACTORS Behavior pattern, Past/current experiences, Time since onset of injury/illness/exacerbation, and 1-2 comorbidities: anxiety  are also affecting patient's functional outcome.    REHAB POTENTIAL: Good   CLINICAL DECISION MAKING: Stable/uncomplicated   EVALUATION COMPLEXITY: Low   PLAN: PT FREQUENCY: 1-2x/week   PT DURATION: 12 weeks   PLANNED INTERVENTIONS: Therapeutic exercises, Therapeutic activity, Neuromuscular re-education, Balance training, Gait training, Patient/Family education, Self Care, Joint mobilization, Stair training, Vestibular training, Orthotic/Fit training, DME instructions, Aquatic Therapy, Electrical stimulation, Manual therapy, and Re-evaluation   PLAN FOR NEXT SESSION: Pt planning on bringing her own electrodes from home. Continue Bioness for R ant tib/hamstring and work on exercises/gait-might be helpful w/ adv/retreat w/ RLE in stance. Work on RLE strengthening, balance, weight shifting to R. Gait training and improving quality of gait, powderboard gait cycle-NMES electrodes, can trial spanish squats again, could try stair lunge or half kneeling core work, right  internal rotator strength  Bary Richard, PT, DPT  01/25/2022, 4:52 PM

## 2022-01-27 ENCOUNTER — Ambulatory Visit: Payer: 59

## 2022-02-01 ENCOUNTER — Encounter: Payer: Self-pay | Admitting: Physical Therapy

## 2022-02-01 ENCOUNTER — Ambulatory Visit: Payer: 59 | Admitting: Physical Therapy

## 2022-02-01 DIAGNOSIS — M6281 Muscle weakness (generalized): Secondary | ICD-10-CM

## 2022-02-01 DIAGNOSIS — R2689 Other abnormalities of gait and mobility: Secondary | ICD-10-CM

## 2022-02-01 DIAGNOSIS — R2681 Unsteadiness on feet: Secondary | ICD-10-CM | POA: Diagnosis not present

## 2022-02-01 DIAGNOSIS — R29818 Other symptoms and signs involving the nervous system: Secondary | ICD-10-CM

## 2022-02-01 NOTE — Therapy (Signed)
OUTPATIENT PHYSICAL THERAPY TREATMENT NOTE   Patient Name: Felicia Chen MRN: [id number] DOB:10-21-1965, 56 y.o., female Today's Date: 02/01/2022  PCP: Aretta Nip, MD REFERRING PROVIDER: Courtney Heys, MD  END OF SESSION:   PT End of Session - 02/01/22 1019     Visit Number 14    Number of Visits 21    Date for PT Re-Evaluation 03/09/22    Authorization Type U[employer], VL:40, 20 USED    Authorization - Visit Number 10    Authorization - Number of Visits 20    PT Start Time 1017    PT Stop Time 1100    PT Time Calculation (min) 43 min    Equipment Utilized During Treatment Gait belt    Activity Tolerance Patient tolerated treatment well    Behavior During Therapy WFL for tasks assessed/performed;Anxious              Past Medical History:  Diagnosis Date   Anxiety    Gilbert's syndrome    Past Surgical History:  Procedure Laterality Date   ABDOMINAL HYSTERECTOMY N/A 06/11/2012   Procedure: HYSTERECTOMY ABDOMINAL;  Surgeon: Allena Katz, MD;  Location: [facility];  Service: Gynecology;  Laterality: N/A;   CESAREAN SECTION     x 4   CYSTOSCOPY N/A 06/11/2012   Procedure: CYSTOSCOPY;  Surgeon: Allena Katz, MD;  Location: [facility];  Service: Gynecology;  Laterality: N/A;   HERNIA REPAIR     LAPAROSCOPIC ASSISTED VAGINAL HYSTERECTOMY N/A 06/11/2012   Procedure: LAPAROSCOPIC ASSISTED VAGINAL HYSTERECTOMY atttempted;  Surgeon: Allena Katz, MD;  Location: [facility];  Service: Gynecology;  Laterality: N/A;  Attempted Laparoscopic assisted vaginal hysterectomy.   LAPAROSCOPIC LYSIS OF ADHESIONS N/A 06/11/2012   Procedure: LAPAROSCOPIC LYSIS OF ADHESIONS;  Surgeon: Allena Katz, MD;  Location: [facility];  Service: Gynecology;  Laterality: N/A;   SALPINGOOPHORECTOMY Bilateral 06/11/2012   Procedure: SALPINGO OOPHORECTOMY;  Surgeon: Allena Katz, MD;  Location: [facility];  Service: Gynecology;  Laterality: Bilateral;   Patient Active Problem List    Diagnosis Date Noted   Spasticity as late effect of cerebrovascular accident (CVA) 10/21/2021   Abnormal gait due to muscle weakness 10/21/2021   Nausea without vomiting    Spastic hemiparesis (HCC)    History of hypertension    Neurogenic orthostatic hypotension (HCC)    Transaminitis    Right hemiplegia (HCC)    Hemorrhagic stroke (HCC)    Leucocytosis    Essential hypertension    Dyslipidemia    Right hemiparesis (HCC)    ICH (intracerebral hemorrhage) ([place]) 12/01/2020   HYPERLIPIDEMIA 07/06/2009   OBESITY 07/06/2009    REFERRING DIAG: I61.2 (ICD-10-CM) - Nontraumatic hemorrhage of left cerebral hemisphere (HCC) I69.398,R25.2 (ICD-10-CM) - Spasticity as late effect of cerebrovascular accident (CVA) M62.81,R26.9 (ICD-10-  THERAPY DIAG:  Unsteadiness on feet  Other abnormalities of gait and mobility  Other symptoms and signs involving the nervous system  Muscle weakness (generalized)  Rationale for Evaluation and Treatment Rehabilitation  PERTINENT HISTORY: hx of R hemiplegia due to stroke in 10/22- secondary to L frontal ICH and small SAH, anxiety  PRECAUTIONS: fall, R hemi  SUBJECTIVE: Had a good trip to [facility]. Planning on going to see the lights at the [facility]. Had a good birthday - made her own dinner.   PAIN:  Are you having pain? No   TODAY'S TREATMENT:  NMR: Prior to gait training, performed L sidelying with use of powderboard to  RPLE to over exaggerate gait cycle in gravity eliminated position, performed 20 reps (beginning at terminal stance with imagining push off and then "driving" leg through for hip/knee flexion and kicking RLE out for initial contact), then performed the reverse after initial contact with performing knee flexion for hamstring activation and then hip extension/knee extension with bringing leg back to starting point. Pt most challenged with extending hip back and had tendency to compensate with hip external rotation.  Needed manual cues for proper alignment.    GAIT: Gait pattern: step to pattern, decreased arm swing- Right, decreased arm swing- Left, decreased step length- Left, decreased stance time- Right, decreased stride length, decreased hip/knee flexion- Right, decreased ankle dorsiflexion- Right, genu recurvatum- Right, and poor foot clearance- Right Distance walked: Clinic distances  Assistive device utilized: Single point cane and with quad tip Level of assistance: SBA Comments: Gait training on treadmill with harness on to overhead system for support for ambulation with no UE support (pt felt more comfortable holding on with LUE). Performed at .52mh for 10 mins 30 seconds  with focus on L knee extension in stance phase and then R knee flexion during swing phase and bringing leg through instead of circumduction.   After treadmill training when ambulating out of session, performed an additional 115' around track with cane with focusing on same mechanics of gait.   PATIENT EDUCATION: Education details: Continue HEP, gait on treadmill w/ focus on increased right stance.  Edu provided on weight traversing from heel-to-toe and supination > pronation activity in foot during phases on gait. Person educated: Patient and pt's daughter Education method: Explanation, Demonstration, Handout, Verbal Cues Education comprehension: verbalized/demonstrated understanding     HOME EXERCISE PROGRAM: [id number]        GOALS: Goals reviewed with patient? Yes   SHORT TERM GOALS: Target date: 01/06/2022   Pt will improve gait speed with LRAD to at least 1.80 ft/sec in order to demo decr fall risk Baseline:21.22 seconds = 1.54 ft/sec with SBQC; 20 seconds = 1.64 ft/sec on 01/12/22 Goal status: NOT MET  2.  Pt will improve condition 4 of mCTSIB to at least 30 seconds in order to demo improved vestibular input for balance.  Baseline: 23 seconds Goal status: INITIAL   3.  Pt will undergo further assessment of  balance test (DGI) with LTG written. Baseline:  Goal status: DEFERRED   4.  Pt will improve TUG time to 18 seconds or less with LRAD in order to demo decrease fall risk. Baseline: 20.15 seconds with SBQC; 15.4 seconds on 01/12/22 Goal status: MET   5.  Pt will improve 30 second chair stand to at least 12 sit <> stands in order to demo improved functional strength with no UE support  Baseline: 11 sit <> stands; 12 sit <> stands on 01/02/22 Goal status: MET     LONG TERM GOALS: Target date: 02/03/2022   Pt will be independent with final HEP in order to build upon functional gains made in therapy. Baseline:  Goal status: INITIAL   2.  DGI goal to be written as appropriate.  Baseline:  Goal status: INITIAL   3.  Pt will improve TUG time to 14.5 seconds or less with LRAD in order to demo decrease fall risk. Baseline: 20.15 seconds with SBQC Goal status: REVISED   4.  Pt will ambulate outdoors at least 300' over unlevel surfaces with LRAD and supervision in order to demo improved community mobility Baseline:  Goal status: INITIAL  5.  Pt will improve gait speed with LRAD to at least 2.0 ft/sec in order to demo improved community mobility. Baseline:  Goal status: INITIAL   ASSESSMENT:   CLINICAL IMPRESSION: Today's skilled session focused on gait training with use of powder board for gait cycle in a gravity eliminated position as well as on the treadmill. Had pt in the harness over the treadmill to work on longer bouts of gait with no UE support, but pt preferred to use UE support. Worked on focus on knee extension in LLE stance phase to help decr compensatory circumduction and pt focusing on bending R knee through for swing phase. Pt tolerated well, did best with focusing on cues to just straighten L knee and bend R knee through during swing. Will continue to progress towards LTGs.    OBJECTIVE IMPAIRMENTS Abnormal gait, decreased activity tolerance, decreased balance, decreased  coordination, decreased endurance, decreased mobility, difficulty walking, decreased ROM, decreased strength, impaired flexibility, impaired sensation, impaired tone, and postural dysfunction.    ACTIVITY LIMITATIONS carrying, lifting, bending, and locomotion level   PARTICIPATION LIMITATIONS: driving, community activity, and yard work   PERSONAL FACTORS Behavior pattern, Past/current experiences, Time since onset of injury/illness/exacerbation, and 1-2 comorbidities: anxiety  are also affecting patient's functional outcome.    REHAB POTENTIAL: Good   CLINICAL DECISION MAKING: Stable/uncomplicated   EVALUATION COMPLEXITY: Low   PLAN: PT FREQUENCY: 1-2x/week   PT DURATION: 12 weeks   PLANNED INTERVENTIONS: Therapeutic exercises, Therapeutic activity, Neuromuscular re-education, Balance training, Gait training, Patient/Family education, Self Care, Joint mobilization, Stair training, Vestibular training, Orthotic/Fit training, DME instructions, Aquatic Therapy, Electrical stimulation, Manual therapy, and Re-evaluation   PLAN FOR NEXT SESSION: Pt planning on bringing her own electrodes from home. Continue Bioness for R ant tib/hamstring and work on exercises/gait-might be helpful w/ adv/retreat w/ RLE in stance. Work on RLE strengthening, balance, weight shifting to R. Gait training and improving quality of gait, powderboard gait cycle-NMES electrodes, can trial spanish squats again, could try stair lunge or half kneeling core work, right internal rotator strength  Arliss Journey, PT, DPT  02/01/2022, 11:53 AM

## 2022-02-03 ENCOUNTER — Encounter: Payer: Self-pay | Admitting: Physical Therapy

## 2022-02-03 ENCOUNTER — Ambulatory Visit: Payer: 59 | Admitting: Physical Therapy

## 2022-02-03 DIAGNOSIS — R2689 Other abnormalities of gait and mobility: Secondary | ICD-10-CM

## 2022-02-03 DIAGNOSIS — R29818 Other symptoms and signs involving the nervous system: Secondary | ICD-10-CM

## 2022-02-03 DIAGNOSIS — R2681 Unsteadiness on feet: Secondary | ICD-10-CM | POA: Diagnosis not present

## 2022-02-03 DIAGNOSIS — M6281 Muscle weakness (generalized): Secondary | ICD-10-CM

## 2022-02-03 NOTE — Therapy (Signed)
OUTPATIENT PHYSICAL THERAPY TREATMENT NOTE   Patient Name: Felicia Chen MRN: [id number] DOB:1965/12/30, 56 y.o., female Today's Date: 02/03/2022  PCP: Aretta Nip, MD REFERRING PROVIDER: Courtney Heys, MD  END OF SESSION:   PT End of Session - 02/03/22 1022     Visit Number 15    Number of Visits 21    Date for PT Re-Evaluation 03/09/22    Authorization Type UHC, VL:40, 20 USED    Authorization - Visit Number 14    Authorization - Number of Visits 20    PT Start Time 7322    PT Stop Time 1100    PT Time Calculation (min) 42 min    Equipment Utilized During Treatment Gait belt    Activity Tolerance Patient tolerated treatment well    Behavior During Therapy WFL for tasks assessed/performed;Anxious              Past Medical History:  Diagnosis Date   Anxiety    Gilbert's syndrome    Past Surgical History:  Procedure Laterality Date   ABDOMINAL HYSTERECTOMY N/A 06/11/2012   Procedure: HYSTERECTOMY ABDOMINAL;  Surgeon: Allena Katz, MD;  Location: [facility];  Service: Gynecology;  Laterality: N/A;   CESAREAN SECTION     x 4   CYSTOSCOPY N/A 06/11/2012   Procedure: CYSTOSCOPY;  Surgeon: Allena Katz, MD;  Location: [facility];  Service: Gynecology;  Laterality: N/A;   HERNIA REPAIR     LAPAROSCOPIC ASSISTED VAGINAL HYSTERECTOMY N/A 06/11/2012   Procedure: LAPAROSCOPIC ASSISTED VAGINAL HYSTERECTOMY atttempted;  Surgeon: Allena Katz, MD;  Location: [facility];  Service: Gynecology;  Laterality: N/A;  Attempted Laparoscopic assisted vaginal hysterectomy.   LAPAROSCOPIC LYSIS OF ADHESIONS N/A 06/11/2012   Procedure: LAPAROSCOPIC LYSIS OF ADHESIONS;  Surgeon: Allena Katz, MD;  Location: [facility];  Service: Gynecology;  Laterality: N/A;   SALPINGOOPHORECTOMY Bilateral 06/11/2012   Procedure: SALPINGO OOPHORECTOMY;  Surgeon: Allena Katz, MD;  Location: [facility];  Service: Gynecology;  Laterality: Bilateral;   Patient Active Problem List    Diagnosis Date Noted   Spasticity as late effect of cerebrovascular accident (CVA) 10/21/2021   Abnormal gait due to muscle weakness 10/21/2021   Nausea without vomiting    Spastic hemiparesis (HCC)    History of hypertension    Neurogenic orthostatic hypotension (HCC)    Transaminitis    Right hemiplegia (HCC)    Hemorrhagic stroke (HCC)    Leucocytosis    Essential hypertension    Dyslipidemia    Right hemiparesis (HCC)    ICH (intracerebral hemorrhage) ([place]) 12/01/2020   HYPERLIPIDEMIA 07/06/2009   OBESITY 07/06/2009    REFERRING DIAG: I61.2 (ICD-10-CM) - Nontraumatic hemorrhage of left cerebral hemisphere (HCC) I69.398,R25.2 (ICD-10-CM) - Spasticity as late effect of cerebrovascular accident (CVA) M62.81,R26.9 (ICD-10-  THERAPY DIAG:  Unsteadiness on feet  Other abnormalities of gait and mobility  Other symptoms and signs involving the nervous system  Muscle weakness (generalized)  Rationale for Evaluation and Treatment Rehabilitation  PERTINENT HISTORY: hx of R hemiplegia due to stroke in 10/22- secondary to L frontal ICH and small SAH, anxiety  PRECAUTIONS: fall, R hemi  SUBJECTIVE: Has been feeling more like herself. Was doing a lot of her exercises yesterday and thinks she might have over did it. Has been working on her walking at home.   PAIN:  Are you having pain? No   TODAY'S TREATMENT:  NMR:  On red mat: -Half kneel with RLE posteriorly (  PT helped RLE get into proper position); A/P weight shifting x20 reps, keeping still with incr weight shift in RLE x10 reps head turns R/L , needing intermittent taps to mat/chair for balance.  -Half kneel with RLE anteriorly, very mini lunges x10 reps, with BUE support on chair and mat table, tactile/verbal cues for trying to keep RLE in midline as it tends to abduct when performing -In quadruped, x10 reps LLE hip extension for incr weight bearing through RLE, x10 reps RLE hip extension with cues needed to keep ASIS  pointed towards the ground  Pt able to get on and off the floor with supervision. Performs with standing and using BUE support on mat and getting into half kneel position with RLE anteriorly. Pt stands up the same way.   Standing on level ground: -Standing on RLE for incr weight bearing and UE support, rolling ball forwards and back with LLE 3 times, performed x10 reps, with cues to stand tall through RLE -Soccer ball kicks with 2nd PT and primary PT providing min guard, trying to alternate between R and L leg to kick and trying to perform without UE support, approx. 15 kicks each leg.  -With stance leg on RLE, gentle single toe tap and then trying to do double toe taps to soccer ball x10 reps, with trying to perform without UE support, min guard as needed for balance. Most challenged by double toe taps.   GAIT: Gait pattern: step to pattern, decreased arm swing- Right, decreased arm swing- Left, decreased step length- Left, decreased stance time- Right, decreased stride length, decreased hip/knee flexion- Right, decreased ankle dorsiflexion- Right, genu recurvatum- Right, and poor foot clearance- Right Distance walked: Clinic distances  Assistive device utilized: Single point cane and with quad tip Level of assistance: SBA Comments: Pt focusing on L knee extension in stance phase and bending/driving RLE through during swing phase.     PATIENT EDUCATION: Education details: Continue HEP, can try half kneel exercises with family supervision at home and having a sturdy chair/couch nearby for safety and to help with getting on and off the floor.  Person educated: Patient and pt's daughter Education method: Explanation, Demonstration, Handout, Verbal Cues Education comprehension: verbalized/demonstrated understanding     HOME EXERCISE PROGRAM: [id number]        GOALS: Goals reviewed with patient? Yes   SHORT TERM GOALS: Target date: 01/06/2022   Pt will improve gait speed with LRAD to at  least 1.80 ft/sec in order to demo decr fall risk Baseline:21.22 seconds = 1.54 ft/sec with SBQC; 20 seconds = 1.64 ft/sec on 01/12/22 Goal status: NOT MET  2.  Pt will improve condition 4 of mCTSIB to at least 30 seconds in order to demo improved vestibular input for balance.  Baseline: 23 seconds Goal status: INITIAL   3.  Pt will undergo further assessment of balance test (DGI) with LTG written. Baseline:  Goal status: DEFERRED   4.  Pt will improve TUG time to 18 seconds or less with LRAD in order to demo decrease fall risk. Baseline: 20.15 seconds with SBQC; 15.4 seconds on 01/12/22 Goal status: MET   5.  Pt will improve 30 second chair stand to at least 12 sit <> stands in order to demo improved functional strength with no UE support  Baseline: 11 sit <> stands; 12 sit <> stands on 01/02/22 Goal status: MET     LONG TERM GOALS: Target date: 02/03/2022   Pt will be independent with final HEP in order  to build upon functional gains made in therapy. Baseline:  Goal status: INITIAL   2.  DGI goal to be written as appropriate.  Baseline:  Goal status: INITIAL   3.  Pt will improve TUG time to 14.5 seconds or less with LRAD in order to demo decrease fall risk. Baseline: 20.15 seconds with SBQC Goal status: REVISED   4.  Pt will ambulate outdoors at least 300' over unlevel surfaces with LRAD and supervision in order to demo improved community mobility Baseline:  Goal status: INITIAL   5.  Pt will improve gait speed with LRAD to at least 2.0 ft/sec in order to demo improved community mobility. Baseline:  Goal status: INITIAL   ASSESSMENT:   CLINICAL IMPRESSION: Today's skilled session focused on NMR for RLE strengthening in standing positions and in half kneel/quadruped position. Pt challenged in half kneel position with RLE posteriorly and needing intermittent UE support as needed for balance. Pt to try this at home with a sturdy surface for UE support and family  supervision. Pt improving with floor to stand transfers, able to perform with supervision. Will continue to progress towards LTGs.    OBJECTIVE IMPAIRMENTS Abnormal gait, decreased activity tolerance, decreased balance, decreased coordination, decreased endurance, decreased mobility, difficulty walking, decreased ROM, decreased strength, impaired flexibility, impaired sensation, impaired tone, and postural dysfunction.    ACTIVITY LIMITATIONS carrying, lifting, bending, and locomotion level   PARTICIPATION LIMITATIONS: driving, community activity, and yard work   PERSONAL FACTORS Behavior pattern, Past/current experiences, Time since onset of injury/illness/exacerbation, and 1-2 comorbidities: anxiety  are also affecting patient's functional outcome.    REHAB POTENTIAL: Good   CLINICAL DECISION MAKING: Stable/uncomplicated   EVALUATION COMPLEXITY: Low   PLAN: PT FREQUENCY: 1-2x/week   PT DURATION: 12 weeks   PLANNED INTERVENTIONS: Therapeutic exercises, Therapeutic activity, Neuromuscular re-education, Balance training, Gait training, Patient/Family education, Self Care, Joint mobilization, Stair training, Vestibular training, Orthotic/Fit training, DME instructions, Aquatic Therapy, Electrical stimulation, Manual therapy, and Re-evaluation   PLAN FOR NEXT SESSION: Pt planning on bringing her own electrodes from home. Continue Bioness for R ant tib/hamstring and work on exercises/gait-might be helpful w/ adv/retreat w/ RLE in stance. Work on RLE strengthening, balance, weight shifting to R. Gait training and improving quality of gait, try Elliptical?   Arliss Journey, PT, DPT  02/03/2022, 11:05 AM

## 2022-02-06 ENCOUNTER — Encounter: Payer: 59 | Admitting: Physical Medicine and Rehabilitation

## 2022-02-15 ENCOUNTER — Ambulatory Visit: Payer: 59 | Admitting: Physical Therapy

## 2022-02-15 ENCOUNTER — Encounter: Payer: Self-pay | Admitting: Physical Therapy

## 2022-02-15 DIAGNOSIS — M6281 Muscle weakness (generalized): Secondary | ICD-10-CM

## 2022-02-15 DIAGNOSIS — R2689 Other abnormalities of gait and mobility: Secondary | ICD-10-CM

## 2022-02-15 DIAGNOSIS — R2681 Unsteadiness on feet: Secondary | ICD-10-CM

## 2022-02-15 DIAGNOSIS — R29818 Other symptoms and signs involving the nervous system: Secondary | ICD-10-CM

## 2022-02-15 NOTE — Therapy (Signed)
OUTPATIENT PHYSICAL THERAPY TREATMENT NOTE   Patient Name: Felicia Chen MRN: [id number] DOB:September 19, 1965, 56 y.o., female Today's Date: 02/15/2022  PCP: Aretta Nip, MD REFERRING PROVIDER: Courtney Heys, MD  END OF SESSION:   PT End of Session - 02/15/22 1023     Visit Number 16    Number of Visits 21    Date for PT Re-Evaluation 03/09/22    Authorization Type UHC, VL:[id number], 20 USED    Authorization - Visit Number 16   treatments plus eval   Authorization - Number of Visits 20    PT Start Time 6010   pt late to eval   PT Stop Time 1101    PT Time Calculation (min) 40 min    Equipment Utilized During Treatment Gait belt    Activity Tolerance Patient tolerated treatment well    Behavior During Therapy WFL for tasks assessed/performed;Anxious              Past Medical History:  Diagnosis Date   Anxiety    Gilbert's syndrome    Past Surgical History:  Procedure Laterality Date   ABDOMINAL HYSTERECTOMY N/A 06/11/2012   Procedure: HYSTERECTOMY ABDOMINAL;  Surgeon: Allena Katz, MD;  Location: [facility];  Service: Gynecology;  Laterality: N/A;   CESAREAN SECTION     x 4   CYSTOSCOPY N/A 06/11/2012   Procedure: CYSTOSCOPY;  Surgeon: Allena Katz, MD;  Location: [facility];  Service: Gynecology;  Laterality: N/A;   HERNIA REPAIR     LAPAROSCOPIC ASSISTED VAGINAL HYSTERECTOMY N/A 06/11/2012   Procedure: LAPAROSCOPIC ASSISTED VAGINAL HYSTERECTOMY atttempted;  Surgeon: Allena Katz, MD;  Location: [facility];  Service: Gynecology;  Laterality: N/A;  Attempted Laparoscopic assisted vaginal hysterectomy.   LAPAROSCOPIC LYSIS OF ADHESIONS N/A 06/11/2012   Procedure: LAPAROSCOPIC LYSIS OF ADHESIONS;  Surgeon: Allena Katz, MD;  Location: [facility];  Service: Gynecology;  Laterality: N/A;   SALPINGOOPHORECTOMY Bilateral 06/11/2012   Procedure: SALPINGO OOPHORECTOMY;  Surgeon: Allena Katz, MD;  Location: [facility];  Service: Gynecology;  Laterality: Bilateral;    Patient Active Problem List   Diagnosis Date Noted   Spasticity as late effect of cerebrovascular accident (CVA) 10/21/2021   Abnormal gait due to muscle weakness 10/21/2021   Nausea without vomiting    Spastic hemiparesis (HCC)    History of hypertension    Neurogenic orthostatic hypotension (HCC)    Transaminitis    Right hemiplegia (HCC)    Hemorrhagic stroke (HCC)    Leucocytosis    Essential hypertension    Dyslipidemia    Right hemiparesis (HCC)    ICH (intracerebral hemorrhage) ([place]) 12/01/2020   HYPERLIPIDEMIA 07/06/2009   OBESITY 07/06/2009    REFERRING DIAG: I61.2 (ICD-10-CM) - Nontraumatic hemorrhage of left cerebral hemisphere (HCC) I69.398,R25.2 (ICD-10-CM) - Spasticity as late effect of cerebrovascular accident (CVA) M62.81,R26.9 (ICD-10-  THERAPY DIAG:  Unsteadiness on feet  Other abnormalities of gait and mobility  Muscle weakness (generalized)  Other symptoms and signs involving the nervous system  Rationale for Evaluation and Treatment Rehabilitation  PERTINENT HISTORY: hx of R hemiplegia due to stroke in 10/22- secondary to L frontal ICH and small SAH, anxiety  PRECAUTIONS: fall, R hemi  SUBJECTIVE: Had a very eventful Thanksgiving. Had a fall down the stairs (was only 2 steps),  misjudged the steps and it was dark. Was holding onto her husband and fell to her R, did not hurt herself.   PAIN:  Are you having pain?  No   TODAY'S TREATMENT:    [facility] Adult PT Treatment/Exercise - 02/15/22 1027       Modalities   Modalities Teacher, English as a foreign language Location R Ant Tib    Electrical Stimulation Action For strengthening, neuro re-ed, gait training    Electrical Stimulation Parameters See Tablet 1    Electrical Stimulation Goals Strength;Neuromuscular facilitation            NMR:  GAIT: Gait pattern: step to pattern, decreased arm swing- Right, decreased arm swing- Left, decreased step  length- Left, decreased stance time- Right, decreased stride length, decreased hip/knee flexion- Right, decreased ankle dorsiflexion- Right, and poor foot clearance- Right Distance walked: Clinic distances  Assistive device utilized: Single point cane and with quad tip and no AD with Bioness  Level of assistance: SBA  Concurrent with Bioness to R ant tib/hamstring: 115' x 1 with cane with focus on knee extension on LLE during stance time, incr stance time on RLE and driving RLE through for incr knee flexion. 115' x 2 without AD and PT providing min guard at pt's pelvis with continued cues, pt spending less time in terminal stance on RLE and would have tendency to circumduct to help clear it vs. Spending more time in terminal stance and incr knee flexion. Had pt's daughter record a video of pt so that she could see the different. During 2nd lap, pt focusing on spending more time on RLE and pt did demonstrate improved foot clearance/more fluid gait pattern during swing phase. One episode of min A needed at one point for balance.  230' x 1 with no AD, with pt focusing more on the general task of walking besides thinking of each step during gait as pt tends to overthink cues, pt ambulating more fluidly with last rep   Concurrent with Bioness to RLE:  Staggered stance sit <> stands with RLE posteriorly, performing during on time of 10 seconds and resting during off time of 8 seconds, performed approx. 15 reps total of sit <> stands, focus on slowed pace, and incr weight through heel/eccentric control when lowering down to mat table.     PATIENT EDUCATION: Education details: Dropping down to 1x week for 4 weeks for remainder of December due to visit limit, continue with HEP. Gait training  Person educated: Patient and pt's daughter Education method: Explanation Education comprehension: verbalized/demonstrated understanding     HOME EXERCISE PROGRAM: [id number]        GOALS: Goals reviewed with  patient? Yes   SHORT TERM GOALS: Target date: 01/06/2022   Pt will improve gait speed with LRAD to at least 1.80 ft/sec in order to demo decr fall risk Baseline:21.22 seconds = 1.54 ft/sec with SBQC; 20 seconds = 1.64 ft/sec on 01/12/22 Goal status: NOT MET  2.  Pt will improve condition 4 of mCTSIB to at least 30 seconds in order to demo improved vestibular input for balance.  Baseline: 23 seconds Goal status: INITIAL   3.  Pt will undergo further assessment of balance test (DGI) with LTG written. Baseline:  Goal status: DEFERRED   4.  Pt will improve TUG time to 18 seconds or less with LRAD in order to demo decrease fall risk. Baseline: 20.15 seconds with SBQC; 15.4 seconds on 01/12/22 Goal status: MET   5.  Pt will improve 30 second chair stand to at least 12 sit <> stands in order to demo improved functional strength with no UE  support  Baseline: 11 sit <> stands; 12 sit <> stands on 01/02/22 Goal status: MET     LONG TERM GOALS: Target date: 02/03/2022   Pt will be independent with final HEP in order to build upon functional gains made in therapy. Baseline:  Goal status: INITIAL   2.  DGI goal to be written as appropriate.  Baseline:  Goal status: INITIAL   3.  Pt will improve TUG time to 14.5 seconds or less with LRAD in order to demo decrease fall risk. Baseline: 20.15 seconds with SBQC Goal status: REVISED   4.  Pt will ambulate outdoors at least 300' over unlevel surfaces with LRAD and supervision in order to demo improved community mobility Baseline:  Goal status: INITIAL   5.  Pt will improve gait speed with LRAD to at least 2.0 ft/sec in order to demo improved community mobility. Baseline:  Goal status: INITIAL   ASSESSMENT:   CLINICAL IMPRESSION: Today's skilled session focused on NMR with use of Bioness to R ant tib and hamstring during gait training and NMR for incr RLE weight bearing/strengthening. During gait continued to focus on knee extension with  LLE during stance, incr stance time on RLE and driving RLE forward with incr knee flexion vs. Compensatory circumduction. Pt's daughter videotaped her first lap of gait and pt responded well to visual demonstration on what to work on for remainder of laps (incr stance time of RLE esp into terminal stance). Pt needing a couple instances of min A for balance when ambulating with Bioness with no AD.  Will continue to progress towards LTGs.    OBJECTIVE IMPAIRMENTS Abnormal gait, decreased activity tolerance, decreased balance, decreased coordination, decreased endurance, decreased mobility, difficulty walking, decreased ROM, decreased strength, impaired flexibility, impaired sensation, impaired tone, and postural dysfunction.    ACTIVITY LIMITATIONS carrying, lifting, bending, and locomotion level   PARTICIPATION LIMITATIONS: driving, community activity, and yard work   PERSONAL FACTORS Behavior pattern, Past/current experiences, Time since onset of injury/illness/exacerbation, and 1-2 comorbidities: anxiety  are also affecting patient's functional outcome.    REHAB POTENTIAL: Good   CLINICAL DECISION MAKING: Stable/uncomplicated   EVALUATION COMPLEXITY: Low   PLAN: PT FREQUENCY: 1-2x/week   PT DURATION: 12 weeks   PLANNED INTERVENTIONS: Therapeutic exercises, Therapeutic activity, Neuromuscular re-education, Balance training, Gait training, Patient/Family education, Self Care, Joint mobilization, Stair training, Vestibular training, Orthotic/Fit training, DME instructions, Aquatic Therapy, Electrical stimulation, Manual therapy, and Re-evaluation   PLAN FOR NEXT SESSION: Need to check LTGs and update Pt planning on bringing her own electrodes from home. Continue Bioness for R ant tib/hamstring and work on exercises/gait-might be helpful w/ adv/retreat w/ RLE in stance. Work on RLE strengthening, balance, weight shifting to R. Gait training and improving quality of gait, try Elliptical?    Arliss Journey, PT, DPT  02/15/2022, 12:28 PM

## 2022-02-17 ENCOUNTER — Ambulatory Visit: Payer: 59 | Admitting: Physical Therapy

## 2022-02-22 ENCOUNTER — Ambulatory Visit: Payer: 59 | Admitting: Physical Therapy

## 2022-02-24 ENCOUNTER — Ambulatory Visit: Payer: 59 | Admitting: Physical Therapy

## 2022-03-01 ENCOUNTER — Ambulatory Visit: Payer: 59 | Attending: Physical Medicine and Rehabilitation | Admitting: Physical Therapy

## 2022-03-01 ENCOUNTER — Encounter: Payer: Self-pay | Admitting: Physical Therapy

## 2022-03-01 ENCOUNTER — Ambulatory Visit: Payer: 59 | Admitting: Physical Therapy

## 2022-03-01 DIAGNOSIS — R2681 Unsteadiness on feet: Secondary | ICD-10-CM | POA: Diagnosis present

## 2022-03-01 DIAGNOSIS — R29818 Other symptoms and signs involving the nervous system: Secondary | ICD-10-CM | POA: Diagnosis present

## 2022-03-01 DIAGNOSIS — R2689 Other abnormalities of gait and mobility: Secondary | ICD-10-CM | POA: Diagnosis present

## 2022-03-01 DIAGNOSIS — M6281 Muscle weakness (generalized): Secondary | ICD-10-CM | POA: Insufficient documentation

## 2022-03-01 NOTE — Therapy (Signed)
OUTPATIENT PHYSICAL THERAPY TREATMENT NOTE   Patient Name: Felicia Chen MRN: [id number] DOB:[birth year], 56 y.o., female Today's Date: 03/01/2022  PCP: Aretta Nip, MD REFERRING PROVIDER: Courtney Heys, MD  END OF SESSION:   PT End of Session - 03/01/22 1021     Visit Number 17    Number of Visits 21    Date for PT Re-Evaluation 03/09/22    Authorization Type UHC, VL:40, 59 USED    Authorization - Visit Number 17    Authorization - Number of Visits 20    PT Start Time 1019    PT Stop Time 1102    PT Time Calculation (min) 43 min    Equipment Utilized During Treatment Gait belt    Activity Tolerance Patient tolerated treatment well    Behavior During Therapy WFL for tasks assessed/performed;Anxious              Past Medical History:  Diagnosis Date   Anxiety    Gilbert's syndrome    Past Surgical History:  Procedure Laterality Date   ABDOMINAL HYSTERECTOMY N/A 06/11/2012   Procedure: HYSTERECTOMY ABDOMINAL;  Surgeon: Allena Katz, MD;  Location: [facility];  Service: Gynecology;  Laterality: N/A;   CESAREAN SECTION     x 4   CYSTOSCOPY N/A 06/11/2012   Procedure: CYSTOSCOPY;  Surgeon: Allena Katz, MD;  Location: [facility];  Service: Gynecology;  Laterality: N/A;   HERNIA REPAIR     LAPAROSCOPIC ASSISTED VAGINAL HYSTERECTOMY N/A 06/11/2012   Procedure: LAPAROSCOPIC ASSISTED VAGINAL HYSTERECTOMY atttempted;  Surgeon: Allena Katz, MD;  Location: [facility];  Service: Gynecology;  Laterality: N/A;  Attempted Laparoscopic assisted vaginal hysterectomy.   LAPAROSCOPIC LYSIS OF ADHESIONS N/A 06/11/2012   Procedure: LAPAROSCOPIC LYSIS OF ADHESIONS;  Surgeon: Allena Katz, MD;  Location: [facility];  Service: Gynecology;  Laterality: N/A;   SALPINGOOPHORECTOMY Bilateral 06/11/2012   Procedure: SALPINGO OOPHORECTOMY;  Surgeon: Allena Katz, MD;  Location: [facility];  Service: Gynecology;  Laterality: Bilateral;   Patient Active Problem List    Diagnosis Date Noted   Spasticity as late effect of cerebrovascular accident (CVA) 10/21/2021   Abnormal gait due to muscle weakness 10/21/2021   Nausea without vomiting    Spastic hemiparesis (HCC)    History of hypertension    Neurogenic orthostatic hypotension (HCC)    Transaminitis    Right hemiplegia (HCC)    Hemorrhagic stroke (HCC)    Leucocytosis    Essential hypertension    Dyslipidemia    Right hemiparesis (HCC)    ICH (intracerebral hemorrhage) ([place]) 12/01/2020   HYPERLIPIDEMIA 07/06/2009   OBESITY 07/06/2009    REFERRING DIAG: I61.2 (ICD-10-CM) - Nontraumatic hemorrhage of left cerebral hemisphere (HCC) I69.398,R25.2 (ICD-10-CM) - Spasticity as late effect of cerebrovascular accident (CVA) M62.81,R26.9 (ICD-10-  THERAPY DIAG:  Unsteadiness on feet  Other abnormalities of gait and mobility  Muscle weakness (generalized)  Other symptoms and signs involving the nervous system  Rationale for Evaluation and Treatment Rehabilitation  PERTINENT HISTORY: hx of R hemiplegia due to stroke in 10/22- secondary to L frontal ICH and small SAH, anxiety  PRECAUTIONS: fall, R hemi  SUBJECTIVE: Went to [employer] again, started last week and on Monday went back and now her L leg is really sore. Reports that the PT at [place] reports they have noticed a big difference since she was last there in the summer.   PAIN:  Are you having pain? No  TODAY'S TREATMENT:   Goal Assessment: Gait speed with SPC: 21.47 seconds = 1.52 ft/sec (pt more anxious with gait in therapy) TUG: with no AD: 28.78 seconds, 18.78 seconds with cane   NMR:  Elliptical: Level 1.0 for 5 minutes total, 4 minutes forward, 1 minute backwards, with focusing on knee flexion with RLE and mimicking gait cycle. PT needing to help pt re-position her feet a couple times with RLE for proper alignment. Pt took incr time getting started, but improved once pt discovered technique. Pt needing min A when getting on  and off the elliptical.   With use of Blaze pods for reaction time, SLS :Performed on level ground with 3 blaze pods on a 4" step for incr height and 3 on the floor; tapped 4 with use of LLE for incr SLS time on RLE and 1 with RLE, performed 2 minutes x2 bouts: 18 taps during 1st bout, 29 taps during 2nd bout. Pt able to progress from UE support > none.   GAIT: Gait pattern: step to pattern, decreased arm swing- Right, decreased arm swing- Left, decreased step length- Left, decreased stance time- Right, decreased stride length, decreased hip/knee flexion- Right, decreased ankle dorsiflexion- Right, and poor foot clearance- Right Distance walked: Clinic distances, and 300' outdoors on grass, pavement, and gravel/mulch  Assistive device utilized: Single point cane and with quad tip  Level of assistance: SBA  Ambulated outdoors on unlevel surfaces like grass, mulch, and gravel with SPC and quad tip with supervision. When ambulating outdoors today, pt more balanced than previously and able to incr step length. Pt with no LOB.   Pt asking about review of what to focus on during gait, discussed standing tall through LLE during stance and focus on spending time on RLE to "drive" RLE through with incr RLE hip and knee flexion. Pt sometimes thinking too much about steps during gait and gait does not come out as fluid. Discussed with pt that pt's gait looks best when she is not thinking about every little step.   When pt ambulating out of therapy gym, got RLE stuck a couple times on the floor with pt needing cues to incr LLE step length as she was trying to think more about the other steps with gait.    PATIENT EDUCATION: Education details: Results of goals, adding in another appt due to missed appt last week, gait training.  Person educated: Patient and pt's daughter Education method: Explanation Education comprehension: verbalized/demonstrated understanding     HOME EXERCISE PROGRAM: [id number]         GOALS: Goals reviewed with patient? Yes   SHORT TERM GOALS: Target date: 01/06/2022   Pt will improve gait speed with LRAD to at least 1.80 ft/sec in order to demo decr fall risk Baseline:21.22 seconds = 1.54 ft/sec with SBQC; 20 seconds = 1.64 ft/sec on 01/12/22 Goal status: NOT MET  2.  Pt will improve condition 4 of mCTSIB to at least 30 seconds in order to demo improved vestibular input for balance.  Baseline: 23 seconds Goal status: INITIAL   3.  Pt will undergo further assessment of balance test (DGI) with LTG written. Baseline:  Goal status: DEFERRED   4.  Pt will improve TUG time to 18 seconds or less with LRAD in order to demo decrease fall risk. Baseline: 20.15 seconds with SBQC; 15.4 seconds on 01/12/22 Goal status: MET   5.  Pt will improve 30 second chair stand to at least 12 sit <> stands in order  to demo improved functional strength with no UE support  Baseline: 11 sit <> stands; 12 sit <> stands on 01/02/22 Goal status: MET     LONG TERM GOALS: Target date: 02/03/2022   Pt will be independent with final HEP in order to build upon functional gains made in therapy. Baseline:  Goal status: IN PROGRESS   2.  DGI goal to be written as appropriate.  Baseline:  Goal status: DEFERRED    3.  Pt will improve TUG time to 14.5 seconds or less with LRAD in order to demo decrease fall risk. Baseline: 20.15 seconds with SBQC  TUG: with no AD: 28.78 seconds, 18.78 seconds with cane on 03/01/22 Goal status: NOT MET   4.  Pt will ambulate outdoors at least 300' over unlevel surfaces with LRAD and supervision in order to demo improved community mobility Baseline: on grass/mulch/gravel surfaces and pavement on 03/01/22 Goal status: MET   5.  Pt will improve gait speed with LRAD to at least 2.0 ft/sec in order to demo improved community mobility. Baseline: Gait speed with SPC: 21.47 seconds = 1.52 ft/sec Goal status: NOT MET    On-going LTGs for 12 week POC  LONG  TERM GOALS: Target date: 03/09/2022   Pt will be independent with final HEP in order to build upon functional gains made in therapy. Baseline:  Goal status: ONGOING   2.  Pt will improve TUG time to 23 seconds with no AD in order to demo decrease fall risk. Baseline: with no AD: 28.78 seconds, Goal status: REVISED   3.  Pt will improve gait speed with LRAD to at least 2.0 ft/sec in order to demo improved community mobility. Baseline: Gait speed with SPC: 21.47 seconds = 1.52 ft/sec Goal status: ONGOING   ASSESSMENT:   CLINICAL IMPRESSION: Checked pt's LTGs at start of session. HEP goal is ongoing and DGI goal is deferred. Pt met LTG #4 - able to ambulate outdoors on unlevel surfaces with supervision. Pt more steady and able to take longer steps while ambulating on the grass. Pt did not meet goals in regards to TUG with no AD and gait speed with cane. Pt reports that she is more anxious when coming to therapy and has a tendency to ambulate slower in here than when she is home. LTGs updated for 12 week POC.  Remainder of session focused on using the elliptical for incr knee flexion through RLE as to mimick gait cycle and SLS balance/reactive balance. Pt tolerated session well, pt enjoyed the elliptical. Will continue to progress towards LTGs.    OBJECTIVE IMPAIRMENTS Abnormal gait, decreased activity tolerance, decreased balance, decreased coordination, decreased endurance, decreased mobility, difficulty walking, decreased ROM, decreased strength, impaired flexibility, impaired sensation, impaired tone, and postural dysfunction.    ACTIVITY LIMITATIONS carrying, lifting, bending, and locomotion level   PARTICIPATION LIMITATIONS: driving, community activity, and yard work   PERSONAL FACTORS Behavior pattern, Past/current experiences, Time since onset of injury/illness/exacerbation, and 1-2 comorbidities: anxiety  are also affecting patient's functional outcome.    REHAB POTENTIAL: Good    CLINICAL DECISION MAKING: Stable/uncomplicated   EVALUATION COMPLEXITY: Low   PLAN: PT FREQUENCY: 1-2x/week   PT DURATION: 12 weeks   PLANNED INTERVENTIONS: Therapeutic exercises, Therapeutic activity, Neuromuscular re-education, Balance training, Gait training, Patient/Family education, Self Care, Joint mobilization, Stair training, Vestibular training, Orthotic/Fit training, DME instructions, Aquatic Therapy, Electrical stimulation, Manual therapy, and Re-evaluation   PLAN FOR NEXT SESSION: Continue Bioness for R ant tib/hamstring. Work on RLE strengthening,  balance, weight shifting to R. Gait training and improving quality of gait, try Elliptical again   Arliss Journey, PT, DPT  03/01/2022, 12:16 PM

## 2022-03-08 ENCOUNTER — Ambulatory Visit: Payer: 59 | Admitting: Physical Therapy

## 2022-03-08 ENCOUNTER — Encounter: Payer: Self-pay | Admitting: Physical Therapy

## 2022-03-08 DIAGNOSIS — R29818 Other symptoms and signs involving the nervous system: Secondary | ICD-10-CM

## 2022-03-08 DIAGNOSIS — M6281 Muscle weakness (generalized): Secondary | ICD-10-CM

## 2022-03-08 DIAGNOSIS — R2681 Unsteadiness on feet: Secondary | ICD-10-CM | POA: Diagnosis not present

## 2022-03-08 DIAGNOSIS — R2689 Other abnormalities of gait and mobility: Secondary | ICD-10-CM

## 2022-03-08 NOTE — Therapy (Signed)
OUTPATIENT PHYSICAL THERAPY TREATMENT NOTE   Patient Name: Felicia Chen MRN: [id number] DOB:1966/03/12, 56 y.o., female Today's Date: 03/08/2022  PCP: Aretta Nip, MD REFERRING PROVIDER: Courtney Heys, MD  END OF SESSION:   PT End of Session - 03/08/22 1023     Visit Number 18    Number of Visits 21    Date for PT Re-Evaluation 03/09/22    Authorization Type U[employer], [id number], 20 USED    Authorization - Visit Number 18    Authorization - Number of Visits 20    PT Start Time 1020    PT Stop Time 1100    PT Time Calculation (min) 40 min    Equipment Utilized During Treatment Gait belt    Activity Tolerance Patient tolerated treatment well    Behavior During Therapy WFL for tasks assessed/performed;Anxious              Past Medical History:  Diagnosis Date   Anxiety    Gilbert's syndrome    Past Surgical History:  Procedure Laterality Date   ABDOMINAL HYSTERECTOMY N/A 06/11/2012   Procedure: HYSTERECTOMY ABDOMINAL;  Surgeon: Allena Katz, MD;  Location: [facility];  Service: Gynecology;  Laterality: N/A;   CESAREAN SECTION     x 4   CYSTOSCOPY N/A 06/11/2012   Procedure: CYSTOSCOPY;  Surgeon: Allena Katz, MD;  Location: [facility];  Service: Gynecology;  Laterality: N/A;   HERNIA REPAIR     LAPAROSCOPIC ASSISTED VAGINAL HYSTERECTOMY N/A 06/11/2012   Procedure: LAPAROSCOPIC ASSISTED VAGINAL HYSTERECTOMY atttempted;  Surgeon: Allena Katz, MD;  Location: [facility];  Service: Gynecology;  Laterality: N/A;  Attempted Laparoscopic assisted vaginal hysterectomy.   LAPAROSCOPIC LYSIS OF ADHESIONS N/A 06/11/2012   Procedure: LAPAROSCOPIC LYSIS OF ADHESIONS;  Surgeon: Allena Katz, MD;  Location: [facility];  Service: Gynecology;  Laterality: N/A;   SALPINGOOPHORECTOMY Bilateral 06/11/2012   Procedure: SALPINGO OOPHORECTOMY;  Surgeon: Allena Katz, MD;  Location: [facility];  Service: Gynecology;  Laterality: Bilateral;   Patient Active Problem List    Diagnosis Date Noted   Spasticity as late effect of cerebrovascular accident (CVA) 10/21/2021   Abnormal gait due to muscle weakness 10/21/2021   Nausea without vomiting    Spastic hemiparesis (HCC)    History of hypertension    Neurogenic orthostatic hypotension (HCC)    Transaminitis    Right hemiplegia (HCC)    Hemorrhagic stroke (HCC)    Leucocytosis    Essential hypertension    Dyslipidemia    Right hemiparesis (HCC)    ICH (intracerebral hemorrhage) ([place]) 12/01/2020   HYPERLIPIDEMIA 07/06/2009   OBESITY 07/06/2009    REFERRING DIAG: I61.2 (ICD-10-CM) - Nontraumatic hemorrhage of left cerebral hemisphere (HCC) I69.398,R25.2 (ICD-10-CM) - Spasticity as late effect of cerebrovascular accident (CVA) M62.81,R26.9 (ICD-10-  THERAPY DIAG:  Unsteadiness on feet  Other abnormalities of gait and mobility  Muscle weakness (generalized)  Other symptoms and signs involving the nervous system  Rationale for Evaluation and Treatment Rehabilitation  PERTINENT HISTORY: hx of R hemiplegia due to stroke in 10/22- secondary to L frontal ICH and small SAH, anxiety  PRECAUTIONS: fall, R hemi  SUBJECTIVE: Wore her Ugg slippes and reports that they felt better than sneakers. No falls.   PAIN:  Are you having pain? No   TODAY'S TREATMENT:    [facility] Adult PT Treatment/Exercise - 03/08/22 1435       Electrical Stimulation   Electrical Stimulation Location R Ant Tib  Chartered certified accountant For strengthening, neuro re-ed, gait training    Electrical Stimulation Parameters See Tablet 1    Electrical Stimulation Goals Strength;Neuromuscular facilitation             NMR  GAIT: Gait pattern: step to pattern, decreased arm swing- Right, decreased arm swing- Left, decreased step length- Left, decreased stance time- Right, decreased stride length, decreased hip/knee flexion- Right, decreased ankle dorsiflexion- Right, and poor foot clearance- Right Distance walked: Clinic  distances  Assistive device utilized: Single point cane and with quad tip and no AD with Bioness  Level of assistance: SBA  Without use of Bioness, pt wanted to show PT how she ambulates at home without her shoes and socks on (pt stated that PT at [facility] told her to practice walking at home to help with toe curling), performed 115', with pt demonstrating less toe curling as distance went on and PT educating on purpose of toe extension during gait    Concurrent with Bioness to R ant tib/hamstring: 115' x 1 with cane with focus on knee extension on LLE during stance time, incr stance time on RLE and driving RLE through for incr knee flexion. 115' x 3 without AD and PT providing supervision/min guard, intermittent cues needed for knee extension during stance of LLE and to not "bounce" as pt with tendency to have too much knee flexion on LLE and then suddenly come up into knee extension. Intermittent cues to "drive" RLE through for incr hip flexion to decr compensatory circumduction. During last lap, pt had incr toe out with RLE during stance phase, likely due to fatigue.   With gait with Bioness, minimal cues given (as pt tends to overthink walking steps and gait is not as fluid), with pt focusing on incr step length with RLE and stance time on LLE      Concurrent with Bioness to RLE:  With on time of 10 seconds and off time of 6 seconds: With RLE as stance leg and tapping LLE to 1st and then 2" 6 step with focus on going slowed and controlled and not using UE support, performed approx. 10 times of cycle. Also performed without use of Bioness per pt request to stimulate performing at home. Pt able to perform without UE support x10 reps which improved pt's confidence of being able to perform at home  With RLE on 6" step, performed step ups and having LLE tap 2nd step, performed x8 sets with pt performing what she can in the 10 seconds with focus on knee extension when standing tall and slow eccentric  lowering with RLE, pt needing UE support for this for balance.    PATIENT EDUCATION: Education details: Discussing with Dr. Dagoberto Ligas again regarding her spasticity and toes curling as pt reports not able to tolerate Baclofen well due to feeling dizzy.   Person educated: Patient and pt's daughter Education method: Explanation Education comprehension: verbalized/demonstrated understanding     HOME EXERCISE PROGRAM: [id number]        GOALS: Goals reviewed with patient? Yes   SHORT TERM GOALS: Target date: 01/06/2022   Pt will improve gait speed with LRAD to at least 1.80 ft/sec in order to demo decr fall risk Baseline:21.22 seconds = 1.54 ft/sec with SBQC; 20 seconds = 1.64 ft/sec on 01/12/22 Goal status: NOT MET  2.  Pt will improve condition 4 of mCTSIB to at least 30 seconds in order to demo improved vestibular input for balance.  Baseline: 23 seconds Goal status: INITIAL  3.  Pt will undergo further assessment of balance test (DGI) with LTG written. Baseline:  Goal status: DEFERRED   4.  Pt will improve TUG time to 18 seconds or less with LRAD in order to demo decrease fall risk. Baseline: 20.15 seconds with SBQC; 15.4 seconds on 01/12/22 Goal status: MET   5.  Pt will improve 30 second chair stand to at least 12 sit <> stands in order to demo improved functional strength with no UE support  Baseline: 11 sit <> stands; 12 sit <> stands on 01/02/22 Goal status: MET     On-going LTGs for 12 week POC  LONG TERM GOALS: Target date: 03/09/2022   Pt will be independent with final HEP in order to build upon functional gains made in therapy. Baseline:  Goal status: ONGOING   2.  Pt will improve TUG time to 23 seconds with no AD in order to demo decrease fall risk. Baseline: with no AD: 28.78 seconds, Goal status: REVISED   3.  Pt will improve gait speed with LRAD to at least 2.0 ft/sec in order to demo improved community mobility. Baseline: Gait speed with SPC: 21.47  seconds = 1.52 ft/sec Goal status: ONGOING   ASSESSMENT:   CLINICAL IMPRESSION: Today's skilled session focused on use of Bioness to R ant tib and hamstring during gait and NMR for strengthening. Practiced gait with no AD and to just focus on the task of walking vs. Thinking about every step (as pt tends to do this and gait will not be as fluid). Pt overall demonstrating less vaulting, but still having some circumduction to clear RLE. Also focused on use of Bioness during closed chain strengthening and balance tasks with trying to decr use of UE for support. Will continue to progress towards LTGs.    OBJECTIVE IMPAIRMENTS Abnormal gait, decreased activity tolerance, decreased balance, decreased coordination, decreased endurance, decreased mobility, difficulty walking, decreased ROM, decreased strength, impaired flexibility, impaired sensation, impaired tone, and postural dysfunction.    ACTIVITY LIMITATIONS carrying, lifting, bending, and locomotion level   PARTICIPATION LIMITATIONS: driving, community activity, and yard work   PERSONAL FACTORS Behavior pattern, Past/current experiences, Time since onset of injury/illness/exacerbation, and 1-2 comorbidities: anxiety  are also affecting patient's functional outcome.    REHAB POTENTIAL: Good   CLINICAL DECISION MAKING: Stable/uncomplicated   EVALUATION COMPLEXITY: Low   PLAN: PT FREQUENCY: 1-2x/week   PT DURATION: 12 weeks   PLANNED INTERVENTIONS: Therapeutic exercises, Therapeutic activity, Neuromuscular re-education, Balance training, Gait training, Patient/Family education, Self Care, Joint mobilization, Stair training, Vestibular training, Orthotic/Fit training, DME instructions, Aquatic Therapy, Electrical stimulation, Manual therapy, and Re-evaluation   PLAN FOR NEXT SESSION: Continue Bioness for R ant tib/hamstring. Work on RLE strengthening, balance, weight shifting to R. Gait training and improving quality of gait, try Elliptical  again. Will need to do re-cert.   Arliss Journey, PT, DPT  03/08/2022, 1:05 PM

## 2022-03-15 ENCOUNTER — Ambulatory Visit: Payer: 59 | Admitting: Physical Therapy

## 2022-03-15 ENCOUNTER — Encounter: Payer: Self-pay | Admitting: Physical Therapy

## 2022-03-15 DIAGNOSIS — R2681 Unsteadiness on feet: Secondary | ICD-10-CM | POA: Diagnosis not present

## 2022-03-15 DIAGNOSIS — M6281 Muscle weakness (generalized): Secondary | ICD-10-CM

## 2022-03-15 DIAGNOSIS — R2689 Other abnormalities of gait and mobility: Secondary | ICD-10-CM

## 2022-03-15 DIAGNOSIS — R29818 Other symptoms and signs involving the nervous system: Secondary | ICD-10-CM

## 2022-03-15 NOTE — Therapy (Signed)
OUTPATIENT PHYSICAL THERAPY TREATMENT NOTE/RE-CERT   Patient Name: Felicia Chen MRN: [id number] DOB:1965-12-22, 56 y.o., female Today's Date: 03/15/2022  PCP: Aretta Nip, MD REFERRING PROVIDER: Courtney Heys, MD  END OF SESSION:   PT End of Session - 03/15/22 1023     Visit Number 19    Number of Visits 28    Date for PT Re-Evaluation 05/14/22    Authorization Type UHC, VL:40, 62 USED    Authorization - Visit Number 19    Authorization - Number of Visits 20    PT Start Time 1021    PT Stop Time 1100    PT Time Calculation (min) 39 min    Equipment Utilized During Treatment Gait belt    Activity Tolerance Patient tolerated treatment well   tearful   Behavior During Therapy WFL for tasks assessed/performed;Anxious              Past Medical History:  Diagnosis Date   Anxiety    Gilbert's syndrome    Past Surgical History:  Procedure Laterality Date   ABDOMINAL HYSTERECTOMY N/A 06/11/2012   Procedure: HYSTERECTOMY ABDOMINAL;  Surgeon: Allena Katz, MD;  Location: [facility];  Service: Gynecology;  Laterality: N/A;   CESAREAN SECTION     x 4   CYSTOSCOPY N/A 06/11/2012   Procedure: CYSTOSCOPY;  Surgeon: Allena Katz, MD;  Location: [facility];  Service: Gynecology;  Laterality: N/A;   HERNIA REPAIR     LAPAROSCOPIC ASSISTED VAGINAL HYSTERECTOMY N/A 06/11/2012   Procedure: LAPAROSCOPIC ASSISTED VAGINAL HYSTERECTOMY atttempted;  Surgeon: Allena Katz, MD;  Location: [facility];  Service: Gynecology;  Laterality: N/A;  Attempted Laparoscopic assisted vaginal hysterectomy.   LAPAROSCOPIC LYSIS OF ADHESIONS N/A 06/11/2012   Procedure: LAPAROSCOPIC LYSIS OF ADHESIONS;  Surgeon: Allena Katz, MD;  Location: [facility];  Service: Gynecology;  Laterality: N/A;   SALPINGOOPHORECTOMY Bilateral 06/11/2012   Procedure: SALPINGO OOPHORECTOMY;  Surgeon: Allena Katz, MD;  Location: [facility];  Service: Gynecology;  Laterality: Bilateral;   Patient Active  Problem List   Diagnosis Date Noted   Spasticity as late effect of cerebrovascular accident (CVA) 10/21/2021   Abnormal gait due to muscle weakness 10/21/2021   Nausea without vomiting    Spastic hemiparesis (HCC)    History of hypertension    Neurogenic orthostatic hypotension (HCC)    Transaminitis    Right hemiplegia (HCC)    Hemorrhagic stroke (HCC)    Leucocytosis    Essential hypertension    Dyslipidemia    Right hemiparesis (HCC)    ICH (intracerebral hemorrhage) ([place]) 12/01/2020   HYPERLIPIDEMIA 07/06/2009   OBESITY 07/06/2009    REFERRING DIAG: I61.2 (ICD-10-CM) - Nontraumatic hemorrhage of left cerebral hemisphere (HCC) I69.398,R25.2 (ICD-10-CM) - Spasticity as late effect of cerebrovascular accident (CVA) M62.81,R26.9 (ICD-10-  THERAPY DIAG:  Unsteadiness on feet  Other abnormalities of gait and mobility  Muscle weakness (generalized)  Other symptoms and signs involving the nervous system  Rationale for Evaluation and Treatment Rehabilitation  PERTINENT HISTORY: hx of R hemiplegia due to stroke in 10/22- secondary to L frontal ICH and small SAH, anxiety  PRECAUTIONS: fall, R hemi  SUBJECTIVE: Had a good Christmas. Has been feeling more comfortable walking as she feels her R ankle is bending more.   PAIN:  Are you having pain? No   TODAY'S TREATMENT:     GAIT: Gait pattern: step to pattern, decreased arm swing- Right, decreased arm swing- Left, decreased step  length- Left, decreased stance time- Right, decreased stride length, decreased hip/knee flexion- Right, decreased ankle dorsiflexion- Right, and poor foot clearance- Right Distance walked: Clinic distances  Assistive device utilized: Single point cane and with quad tip  Level of assistance: SBA  When ambulating into session, pt with one episode of getting R toe caught on floor, pt able to maintain balance on her own. Pt reporting not liking the shoes that she wears coming into therapy and either  ambulates at home in her Ugg slippers or no shoes at all. Discussed looking into getting a more comfortable pair of shoes now that pt does not have to wear AFO (pt reports these shoes are too big for her) and potentially getting a new toe cap added to R shoe to help with toe catching as needed. Pt used to have a pair of shoes like that, but they have worn down.       NMR: With red mat on the floor, pt able to get on the floor with supervision with use of BUE support  Half kneel with RLE posteriorly (PT needing to give cues to shift towards R side and bringing LLE forwards and giving pt HHA), holding 1# ball performing 10 reps of chest presses with min guard for balance and pt needing intermittent UE support to chair and to mat table. Then performed with pt holding hands out clasped in front of her and bringing them across body horizontally for trunk rotations, performed x5 reps with pt needing intermittent UE support. At one point pt sitting back to RLE with reporting Tall kneel Half kneel and blaze pods   Therapeutic Activity:  Discussed POC going forwards   Reviewed again discussing with Dr. Dagoberto Ligas again regarding her spasticity and toes curling as pt reports not able to tolerate Baclofen well due to feeling dizzy.    PATIENT EDUCATION: Education details: See therapeutic activity section above, adding more visits for 1x week for 8 weeks  Person educated: Patient and pt's daughter Education method: Explanation Education comprehension: verbalized/demonstrated understanding     HOME EXERCISE PROGRAM: [id number]        GOALS: Goals reviewed with patient? Yes   SHORT TERM GOALS: Target date: 01/06/2022   Pt will improve gait speed with LRAD to at least 1.80 ft/sec in order to demo decr fall risk Baseline:21.22 seconds = 1.54 ft/sec with SBQC; 20 seconds = 1.64 ft/sec on 01/12/22 Goal status: NOT MET  2.  Pt will improve condition 4 of mCTSIB to at least 30 seconds in order to demo  improved vestibular input for balance.  Baseline: 23 seconds Goal status: INITIAL   3.  Pt will undergo further assessment of balance test (DGI) with LTG written. Baseline:  Goal status: DEFERRED   4.  Pt will improve TUG time to 18 seconds or less with LRAD in order to demo decrease fall risk. Baseline: 20.15 seconds with SBQC; 15.4 seconds on 01/12/22 Goal status: MET   5.  Pt will improve 30 second chair stand to at least 12 sit <> stands in order to demo improved functional strength with no UE support  Baseline: 11 sit <> stands; 12 sit <> stands on 01/02/22 Goal status: MET     On-going LTGs for 12 week POC  LONG TERM GOALS: Target date: 03/09/2022   Pt will be independent with final HEP in order to build upon functional gains made in therapy. Baseline:  Goal status: ONGOING   2.  Pt will improve TUG time  to 23 seconds with no AD in order to demo decrease fall risk. Baseline: with no AD: 28.78 seconds, Goal status: REVISED   3.  Pt will improve gait speed with LRAD to at least 2.0 ft/sec in order to demo improved community mobility. Baseline: Gait speed with SPC: 21.47 seconds = 1.52 ft/sec Goal status: ONGOING   ASSESSMENT:   CLINICAL IMPRESSION: Today's skilled session focused on use of Bioness to R ant tib and hamstring during gait and NMR for strengthening. Practiced gait with no AD and to just focus on the task of walking vs. Thinking about every step (as pt tends to do this and gait will not be as fluid). Pt overall demonstrating less vaulting, but still having some circumduction to clear RLE. Also focused on use of Bioness during closed chain strengthening and balance tasks with trying to decr use of UE for support. Will continue to progress towards LTGs.    OBJECTIVE IMPAIRMENTS Abnormal gait, decreased activity tolerance, decreased balance, decreased coordination, decreased endurance, decreased mobility, difficulty walking, decreased ROM, decreased strength,  impaired flexibility, impaired sensation, impaired tone, and postural dysfunction.    ACTIVITY LIMITATIONS carrying, lifting, bending, and locomotion level   PARTICIPATION LIMITATIONS: driving, community activity, and yard work   PERSONAL FACTORS Behavior pattern, Past/current experiences, Time since onset of injury/illness/exacerbation, and 1-2 comorbidities: anxiety  are also affecting patient's functional outcome.    REHAB POTENTIAL: Good   CLINICAL DECISION MAKING: Stable/uncomplicated   EVALUATION COMPLEXITY: Low   PLAN: PT FREQUENCY: 1-2x/week   PT DURATION: 8 weeks   PLANNED INTERVENTIONS: Therapeutic exercises, Therapeutic activity, Neuromuscular re-education, Balance training, Gait training, Patient/Family education, Self Care, Joint mobilization, Stair training, Vestibular training, Orthotic/Fit training, DME instructions, Aquatic Therapy, Electrical stimulation, Manual therapy, and Re-evaluation   PLAN FOR NEXT SESSION: Continue Bioness for R ant tib/hamstring. Work on RLE strengthening, balance, weight shifting to R. Gait training and improving quality of gait, try Elliptical again. Will need to do re-cert.   Arliss Journey, PT, DPT  03/15/2022, 2:57 PM

## 2022-03-16 ENCOUNTER — Encounter: Payer: Self-pay | Admitting: Physical Therapy

## 2022-03-16 ENCOUNTER — Ambulatory Visit: Payer: 59 | Admitting: Physical Therapy

## 2022-03-16 DIAGNOSIS — R29818 Other symptoms and signs involving the nervous system: Secondary | ICD-10-CM

## 2022-03-16 DIAGNOSIS — M6281 Muscle weakness (generalized): Secondary | ICD-10-CM

## 2022-03-16 DIAGNOSIS — R2689 Other abnormalities of gait and mobility: Secondary | ICD-10-CM

## 2022-03-16 DIAGNOSIS — R2681 Unsteadiness on feet: Secondary | ICD-10-CM

## 2022-03-16 NOTE — Therapy (Signed)
OUTPATIENT PHYSICAL THERAPY TREATMENT NOTE   Patient Name: Felicia Chen MRN: [id number] DOB:30-Jan-1966, 56 y.o., female Today's Date: 03/16/2022  PCP: Aretta Nip, MD REFERRING PROVIDER: Courtney Heys, MD  END OF SESSION:   PT End of Session - 03/16/22 1108     Visit Number 20    Number of Visits 28    Date for PT Re-Evaluation 05/14/22    Authorization Type UHC, [id number], 20 USED    Authorization - Visit Number 20    Authorization - Number of Visits 20    PT Start Time 0254    PT Stop Time 1145    PT Time Calculation (min) 40 min    Equipment Utilized During Treatment Gait belt    Activity Tolerance Patient tolerated treatment well    Behavior During Therapy WFL for tasks assessed/performed;Anxious              Past Medical History:  Diagnosis Date   Anxiety    Gilbert's syndrome    Past Surgical History:  Procedure Laterality Date   ABDOMINAL HYSTERECTOMY N/A 06/11/2012   Procedure: HYSTERECTOMY ABDOMINAL;  Surgeon: Allena Katz, MD;  Location: [facility];  Service: Gynecology;  Laterality: N/A;   CESAREAN SECTION     x 4   CYSTOSCOPY N/A 06/11/2012   Procedure: CYSTOSCOPY;  Surgeon: Allena Katz, MD;  Location: [facility];  Service: Gynecology;  Laterality: N/A;   HERNIA REPAIR     LAPAROSCOPIC ASSISTED VAGINAL HYSTERECTOMY N/A 06/11/2012   Procedure: LAPAROSCOPIC ASSISTED VAGINAL HYSTERECTOMY atttempted;  Surgeon: Allena Katz, MD;  Location: [facility];  Service: Gynecology;  Laterality: N/A;  Attempted Laparoscopic assisted vaginal hysterectomy.   LAPAROSCOPIC LYSIS OF ADHESIONS N/A 06/11/2012   Procedure: LAPAROSCOPIC LYSIS OF ADHESIONS;  Surgeon: Allena Katz, MD;  Location: [facility];  Service: Gynecology;  Laterality: N/A;   SALPINGOOPHORECTOMY Bilateral 06/11/2012   Procedure: SALPINGO OOPHORECTOMY;  Surgeon: Allena Katz, MD;  Location: [facility];  Service: Gynecology;  Laterality: Bilateral;   Patient Active Problem List    Diagnosis Date Noted   Spasticity as late effect of cerebrovascular accident (CVA) 10/21/2021   Abnormal gait due to muscle weakness 10/21/2021   Nausea without vomiting    Spastic hemiparesis (HCC)    History of hypertension    Neurogenic orthostatic hypotension (HCC)    Transaminitis    Right hemiplegia (HCC)    Hemorrhagic stroke (HCC)    Leucocytosis    Essential hypertension    Dyslipidemia    Right hemiparesis (HCC)    ICH (intracerebral hemorrhage) ([place]) 12/01/2020   HYPERLIPIDEMIA 07/06/2009   OBESITY 07/06/2009    REFERRING DIAG: I61.2 (ICD-10-CM) - Nontraumatic hemorrhage of left cerebral hemisphere (HCC) I69.398,R25.2 (ICD-10-CM) - Spasticity as late effect of cerebrovascular accident (CVA) M62.81,R26.9 (ICD-10-  THERAPY DIAG:  Unsteadiness on feet  Other abnormalities of gait and mobility  Muscle weakness (generalized)  Other symptoms and signs involving the nervous system  Rationale for Evaluation and Treatment Rehabilitation  PERTINENT HISTORY: hx of R hemiplegia due to stroke in 10/22- secondary to L frontal ICH and small SAH, anxiety  PRECAUTIONS: fall, R hemi  SUBJECTIVE: Wore her Ugg slippers today. Reports R knee is feeling better today after tall kneeling. Was icing her knee last night.   PAIN:  Are you having pain? No   TODAY'S TREATMENT:      GAIT: Gait pattern: step to pattern, decreased arm swing- Right, decreased arm swing- Left, decreased  step length- Left, decreased stance time- Right, decreased stride length, decreased hip/knee flexion- Right, decreased ankle dorsiflexion- Right, and poor foot clearance- Right Distance walked: Clinic distances  Assistive device utilized: Single point cane and with quad tip  Level of assistance: SBA    NMR: With red mat on the floor, pt able to get on the floor with supervision with use of BUE support   In quadruped position:  Alternating hip extension x6 reps each side, verbal/tactile cues to keep  R hip pointed to the floor as pt initially performs with compensatory rotation  Bird dog x5 reps each side. Pt with difficulty trying to lift up her L arm when her whole weight if shifted to R side. Needing min guard/min A for steadying, but pt improved with incr reps. Can't keep LUE off the ground for >1 second  Alternating hip extensions with trying to keep knee bent x7 reps bilat, pt with decr knee flexion with RLE, but able to perform with leg slightly bent. Needs verbal/tactile cues for proper positioning   At bottom of stair case standing on 2 pillows: Step tap to 6" and 2nd 12" step, performed x10 reps with cues for slowed and controlled movement. Discussed this is how pt can progress this at home by standing on a compliant surface as standing on level ground got too easy  Step tap to 12" step forward and cross body tap, x12 reps each side, min guard at times for balance when pt had to cross body    With RLE on air ex, stepping LLE up onto air ex 10 reps without UE support   PATIENT EDUCATION: Education details: Additions to HEP (quadruped on floor) Person educated: Patient and pt's daughter Education method: Explanation, Demonstration  Education comprehension: verbalized/demonstrated understanding     HOME EXERCISE PROGRAM: [id number]  Tall kneeling mini squats with bias towards R side Quadruped hip extensions with leg straight and attempting with knee bent        GOALS: Goals reviewed with patient? Yes   NEW STGS FOR RE-CERT   SHORT TERM GOALS: Target Date: 04/13/2022    Pt will initiate aquatic therapy.  Baseline: Goal status: NEW  2.  Pt will improve TUG time to 23 seconds with no AD in order to demo decrease fall risk. Baseline: with no AD: 28.78 seconds, Goal status: NEW  3.  Pt will improve gait speed with LRAD to at least 1.7 ft/sec in order to demo improved community mobility/decr fall risk Baseline: Gait speed with SPC: 21.47 seconds = 1.52 ft/sec Goal status:  NEW  4. Pt will perform cog TUG and manual TUG with LTG written for improved dual tasking.  Baseline:  Goal status: NEW    Updated/ongoing LTGs for re-cert:  LONG TERM GOALS: Target date: 05/11/2022   Pt will be independent with final HEP for land/aquatic therapy in order to build upon functional gains made in therapy. Baseline:  Goal status: ONGOING   2.  Pt will improve TUG time to 20 seconds with no AD in order to demo decrease fall risk. Baseline: with no AD: 28.78 seconds, Goal status: REVISED   3.  Pt will improve gait speed with LRAD vs. No AD to at least 2.0 ft/sec in order to demo improved community mobility. Baseline: Gait speed with SPC: 21.47 seconds = 1.52 ft/sec Goal status: ONGOING  4.  Cog TUG/manual TUG goal to be written for improved dual tasking  Baseline:  Goal status: NEW  5.  Pt  will ambulate at least 500' over unlevel grass surfaces with no AD vs. LRAD with supervision in order to demo improved community mobility over unlevel surfaces  Baseline:  Goal status: NEW    6.  Pt will improve SLS on RLE to at least 2-3 seconds in order to demo improved SLS.  Baseline: <1 second   Goal status: NEW    ASSESSMENT:   CLINICAL IMPRESSION: Today's skilled session focused on NMR/strengthening/balance for RLE. Pt tolerated session much better today compared to yesterday and was not as anxious. Performed quadruped exercises that she can safely perform at home for strengthening and progressed toe tap exercise at the staircase (standing on 2 pillows for a compliant surface). Pt tolerated session well, will continue to progress towards LTGs.   OBJECTIVE IMPAIRMENTS Abnormal gait, decreased activity tolerance, decreased balance, decreased coordination, decreased endurance, decreased mobility, difficulty walking, decreased ROM, decreased strength, impaired flexibility, impaired sensation, impaired tone, and postural dysfunction.    ACTIVITY LIMITATIONS carrying, lifting,  bending, and locomotion level   PARTICIPATION LIMITATIONS: driving, community activity, and yard work   PERSONAL FACTORS Behavior pattern, Past/current experiences, Time since onset of injury/illness/exacerbation, and 1-2 comorbidities: anxiety  are also affecting patient's functional outcome.    REHAB POTENTIAL: Good   CLINICAL DECISION MAKING: Stable/uncomplicated   EVALUATION COMPLEXITY: Low   PLAN: PT FREQUENCY: 1-2x/week   PT DURATION: 8 weeks   PLANNED INTERVENTIONS: Therapeutic exercises, Therapeutic activity, Neuromuscular re-education, Balance training, Gait training, Patient/Family education, Self Care, Joint mobilization, Stair training, Vestibular training, Orthotic/Fit training, DME instructions, Aquatic Therapy, Electrical stimulation, Manual therapy, and Re-evaluation   PLAN FOR NEXT SESSION: Ask about pool on Tuesday the 9th  Continue Bioness for R ant tib/hamstring as needed. Work on RLE strengthening, balance, weight shifting to R. Gait training and improving quality of gait, try Elliptical again. Get pt in for aquatics. Add balance to HEP   Arliss Journey, PT, DPT  03/16/2022, 1:01 PM

## 2022-03-21 ENCOUNTER — Ambulatory Visit: Payer: 59 | Admitting: Physical Therapy

## 2022-03-28 ENCOUNTER — Encounter: Payer: Self-pay | Admitting: Physical Therapy

## 2022-03-28 ENCOUNTER — Ambulatory Visit: Payer: 59 | Attending: Physical Medicine and Rehabilitation | Admitting: Physical Therapy

## 2022-03-28 DIAGNOSIS — R2681 Unsteadiness on feet: Secondary | ICD-10-CM

## 2022-03-28 DIAGNOSIS — M6281 Muscle weakness (generalized): Secondary | ICD-10-CM

## 2022-03-28 DIAGNOSIS — R2689 Other abnormalities of gait and mobility: Secondary | ICD-10-CM

## 2022-03-28 DIAGNOSIS — I69351 Hemiplegia and hemiparesis following cerebral infarction affecting right dominant side: Secondary | ICD-10-CM | POA: Insufficient documentation

## 2022-03-28 DIAGNOSIS — R29818 Other symptoms and signs involving the nervous system: Secondary | ICD-10-CM | POA: Diagnosis present

## 2022-03-28 NOTE — Therapy (Signed)
OUTPATIENT PHYSICAL THERAPY TREATMENT NOTE   Patient Name: Felicia Chen MRN: [id number] DOB:June 01, 1965, 57 y.o., female Today's Date: 03/28/2022  PCP: Aretta Nip, MD REFERRING PROVIDER: Courtney Heys, MD  END OF SESSION:   PT End of Session - 03/28/22 1024     Visit Number 21    Number of Visits 28    Date for PT Re-Evaluation 05/14/22    Authorization Type UHC,    PT Start Time 1022   pt late to session   Equipment Utilized During Treatment Gait belt    Activity Tolerance Patient tolerated treatment well    Behavior During Therapy [facility] for tasks assessed/performed;Anxious              Past Medical History:  Diagnosis Date   Anxiety    Gilbert's syndrome    Past Surgical History:  Procedure Laterality Date   ABDOMINAL HYSTERECTOMY N/A 06/11/2012   Procedure: HYSTERECTOMY ABDOMINAL;  Surgeon: Allena Katz, MD;  Location: [facility];  Service: Gynecology;  Laterality: N/A;   CESAREAN SECTION     x 4   CYSTOSCOPY N/A 06/11/2012   Procedure: CYSTOSCOPY;  Surgeon: Allena Katz, MD;  Location: [facility];  Service: Gynecology;  Laterality: N/A;   HERNIA REPAIR     LAPAROSCOPIC ASSISTED VAGINAL HYSTERECTOMY N/A 06/11/2012   Procedure: LAPAROSCOPIC ASSISTED VAGINAL HYSTERECTOMY atttempted;  Surgeon: Allena Katz, MD;  Location: [facility];  Service: Gynecology;  Laterality: N/A;  Attempted Laparoscopic assisted vaginal hysterectomy.   LAPAROSCOPIC LYSIS OF ADHESIONS N/A 06/11/2012   Procedure: LAPAROSCOPIC LYSIS OF ADHESIONS;  Surgeon: Allena Katz, MD;  Location: [facility];  Service: Gynecology;  Laterality: N/A;   SALPINGOOPHORECTOMY Bilateral 06/11/2012   Procedure: SALPINGO OOPHORECTOMY;  Surgeon: Allena Katz, MD;  Location: [facility];  Service: Gynecology;  Laterality: Bilateral;   Patient Active Problem List   Diagnosis Date Noted   Spasticity as late effect of cerebrovascular accident (CVA) 10/21/2021   Abnormal gait due to muscle weakness  10/21/2021   Nausea without vomiting    Spastic hemiparesis (HCC)    History of hypertension    Neurogenic orthostatic hypotension (HCC)    Transaminitis    Right hemiplegia (HCC)    Hemorrhagic stroke (HCC)    Leucocytosis    Essential hypertension    Dyslipidemia    Right hemiparesis (HCC)    ICH (intracerebral hemorrhage) ([place]) 12/01/2020   HYPERLIPIDEMIA 07/06/2009   OBESITY 07/06/2009    REFERRING DIAG: I61.2 (ICD-10-CM) - Nontraumatic hemorrhage of left cerebral hemisphere (HCC) I69.398,R25.2 (ICD-10-CM) - Spasticity as late effect of cerebrovascular accident (CVA) M62.81,R26.9 (ICD-10-  THERAPY DIAG:  Other abnormalities of gait and mobility  Unsteadiness on feet  Muscle weakness (generalized)  Other symptoms and signs involving the nervous system  Rationale for Evaluation and Treatment Rehabilitation  PERTINENT HISTORY: hx of R hemiplegia due to stroke in 10/22- secondary to L frontal ICH and small SAH, anxiety  PRECAUTIONS: fall, R hemi  SUBJECTIVE: Was able to get up from a low couch and it went well. Had to spend a day and a half by herself at home and was able to take care of the dogs and everything went well. Things have been going well with the balance.   PAIN:  Are you having pain? No   TODAY'S TREATMENT:      GAIT: Gait pattern: step to pattern, decreased arm swing- Right, decreased arm swing- Left, decreased step length- Left, decreased stance time-  Right, decreased stride length, decreased hip/knee flexion- Right, decreased ankle dorsiflexion- Right, and poor foot clearance- Right Distance walked: Clinic distances  Assistive device utilized: Single point cane and with quad tip  Level of assistance: SBA  Pt wearing sneakers with R toe cap today, so pt demonstrating improved foot clearance and not getting her toe caught.     NMR:  Elliptical: Level 1.0 for 6 minutes total, 4 minutes forward, 2 minute backwards, with focusing on knee flexion  with RLE and mimicking gait cycle. PT needing to help pt re-position her feet a couple times with RLE for proper alignment. Cues for knee extension prior to bending RLE, especially when going backwards instead of performing in a crouched position. Pt needing min guard/min A when getting off of elliptical.   With red mat on the floor, pt able to get on the floor with supervision with use of BUE support   In quadruped position (reviewed from last session when given as HEP, per pt request) Alternating hip extension x8 reps each side, verbal/tactile cues to keep R hip pointed to the floor as pt initially performs with compensatory rotation. During one rep, fell forward straight onto the floor, and ended up laying on her R side with her face close to the floor. Pt not hurt and pt unsure what happened, feels like that she lost her balance. Pt has been in this position many times before and has not lost her balance in quadruped position.  Pt able to get back into quadruped position, and requesting to perform a couple more reps, performed 2 more each side. Otherwise pt able to perform all reps with supervision.   Standing on rockerboard in A/P direction: With RLE as stance leg on rockerboard:  Stepping LLE up and then back down to floor x15 reps for incr stance time on RLE, beginning with UE support > none. Pt RLE shaking at times due to fatigue. Modified SLS with RLE on board and LLE on 6" step, static balance x30 seconds, then trying for 2 sets of 5 reps head turns with close min guard for balance. Pt with tendency to have weight shifted more anteriorly onto LLE when performing. Cues for incr weight shift to R side.   PATIENT EDUCATION: Education details: Discussed starting aquatic therapy next week (currently have a spot held for pt next Tuesday). Pt asking about POC going forwards- will have more land/aquatic therapy sessions in the next 8 weeks and then take a break from OPPT and work on things at home  before coming back again in a few months.  Person educated: Patient and pt's daughter Education method: Explanation, Demonstration  Education comprehension: verbalized/demonstrated understanding     HOME EXERCISE PROGRAM: [id number]  Tall kneeling mini squats with bias towards R side Quadruped hip extensions with leg straight and attempting with knee bent        GOALS: Goals reviewed with patient? Yes   NEW STGS FOR RE-CERT   SHORT TERM GOALS: Target Date: 04/13/2022    Pt will initiate aquatic therapy.  Baseline: Goal status: NEW  2.  Pt will improve TUG time to 23 seconds with no AD in order to demo decrease fall risk. Baseline: with no AD: 28.78 seconds, Goal status: NEW  3.  Pt will improve gait speed with LRAD to at least 1.7 ft/sec in order to demo improved community mobility/decr fall risk Baseline: Gait speed with SPC: 21.47 seconds = 1.52 ft/sec Goal status: NEW  4. Pt will perform  cog TUG and manual TUG with LTG written for improved dual tasking.  Baseline:  Goal status: NEW    Updated/ongoing LTGs for re-cert:  LONG TERM GOALS: Target date: 05/11/2022   Pt will be independent with final HEP for land/aquatic therapy in order to build upon functional gains made in therapy. Baseline:  Goal status: ONGOING   2.  Pt will improve TUG time to 20 seconds with no AD in order to demo decrease fall risk. Baseline: with no AD: 28.78 seconds, Goal status: REVISED   3.  Pt will improve gait speed with LRAD vs. No AD to at least 2.0 ft/sec in order to demo improved community mobility. Baseline: Gait speed with SPC: 21.47 seconds = 1.52 ft/sec Goal status: ONGOING  4.  Cog TUG/manual TUG goal to be written for improved dual tasking  Baseline:  Goal status: NEW  5.  Pt will ambulate at least 500' over unlevel grass surfaces with no AD vs. LRAD with supervision in order to demo improved community mobility over unlevel surfaces  Baseline:  Goal status: NEW    6.  Pt  will improve SLS on RLE to at least 2-3 seconds in order to demo improved SLS.  Baseline: <1 second   Goal status: NEW    ASSESSMENT:   CLINICAL IMPRESSION: Today's skilled session focused on NMR/strengthening/balance for RLE. Started session on the elliptical today with pt working on going forwards/backwards to mimick the gait cycle with focusing on knee extension and knee flexion instead of staying in a crouched position. Pt able to tolerate well, pt's legs more fatigued after starting with elliptical. Pt lost her balance today in quadruped position on floor with alternating leg extensions and fell forwards, but pt not injured and able to get back up to quadruped with supervision and pt laughing about falling forwards. Pt then requesting to do more alternating leg extensions. Pt wearing her shoes with R toe cap and able to demonstrate less toe catching today compared to wearing her other shoes. Intermittent cues for incr stance time on LLE. Will continue per POC.   OBJECTIVE IMPAIRMENTS Abnormal gait, decreased activity tolerance, decreased balance, decreased coordination, decreased endurance, decreased mobility, difficulty walking, decreased ROM, decreased strength, impaired flexibility, impaired sensation, impaired tone, and postural dysfunction.    ACTIVITY LIMITATIONS carrying, lifting, bending, and locomotion level   PARTICIPATION LIMITATIONS: driving, community activity, and yard work   PERSONAL FACTORS Behavior pattern, Past/current experiences, Time since onset of injury/illness/exacerbation, and 1-2 comorbidities: anxiety  are also affecting patient's functional outcome.    REHAB POTENTIAL: Good   CLINICAL DECISION MAKING: Stable/uncomplicated   EVALUATION COMPLEXITY: Low   PLAN: PT FREQUENCY: 1-2x/week   PT DURATION: 8 weeks   PLANNED INTERVENTIONS: Therapeutic exercises, Therapeutic activity, Neuromuscular re-education, Balance training, Gait training, Patient/Family  education, Self Care, Joint mobilization, Stair training, Vestibular training, Orthotic/Fit training, DME instructions, Aquatic Therapy, Electrical stimulation, Manual therapy, and Re-evaluation   PLAN FOR NEXT SESSION:  Work on RLE strengthening, balance, weight shifting to R. Gait training and improving quality of gait, try Elliptical again. Get pt in for aquatics. Add balance to HEP as needed.   Arliss Journey, PT, DPT  03/28/2022, 11:46 AM

## 2022-03-30 ENCOUNTER — Encounter: Payer: Self-pay | Admitting: Physical Therapy

## 2022-04-03 ENCOUNTER — Encounter: Payer: Self-pay | Admitting: Rehabilitation

## 2022-04-04 ENCOUNTER — Ambulatory Visit: Payer: 59 | Admitting: Rehabilitation

## 2022-04-05 ENCOUNTER — Ambulatory Visit: Payer: 59 | Admitting: Physical Therapy

## 2022-04-07 ENCOUNTER — Ambulatory Visit: Payer: 59 | Admitting: Physical Therapy

## 2022-04-07 ENCOUNTER — Encounter: Payer: Self-pay | Admitting: Physical Therapy

## 2022-04-07 DIAGNOSIS — M6281 Muscle weakness (generalized): Secondary | ICD-10-CM

## 2022-04-07 DIAGNOSIS — R2689 Other abnormalities of gait and mobility: Secondary | ICD-10-CM | POA: Diagnosis not present

## 2022-04-07 DIAGNOSIS — R2681 Unsteadiness on feet: Secondary | ICD-10-CM

## 2022-04-07 DIAGNOSIS — R29818 Other symptoms and signs involving the nervous system: Secondary | ICD-10-CM

## 2022-04-07 NOTE — Therapy (Signed)
OUTPATIENT PHYSICAL THERAPY TREATMENT NOTE   Patient Name: Felicia Chen MRN: [id number] DOB:[birth year], 57 y.o., female Today's Date: 04/07/2022  PCP: Aretta Nip, MD REFERRING PROVIDER: Courtney Heys, MD  END OF SESSION:   PT End of Session - 04/07/22 1501     Visit Number 22    Number of Visits 28    Date for PT Re-Evaluation 05/14/22    Authorization Type U[employer],    PT Start Time 1400    PT Stop Time 1443    PT Time Calculation (min) 43 min    Equipment Utilized During Treatment Gait belt    Activity Tolerance Patient tolerated treatment well    Behavior During Therapy WFL for tasks assessed/performed;Anxious              Past Medical History:  Diagnosis Date   Anxiety    Gilbert's syndrome    Past Surgical History:  Procedure Laterality Date   ABDOMINAL HYSTERECTOMY N/A 06/11/2012   Procedure: HYSTERECTOMY ABDOMINAL;  Surgeon: Allena Katz, MD;  Location: [facility];  Service: Gynecology;  Laterality: N/A;   CESAREAN SECTION     x 4   CYSTOSCOPY N/A 06/11/2012   Procedure: CYSTOSCOPY;  Surgeon: Allena Katz, MD;  Location: [facility];  Service: Gynecology;  Laterality: N/A;   HERNIA REPAIR     LAPAROSCOPIC ASSISTED VAGINAL HYSTERECTOMY N/A 06/11/2012   Procedure: LAPAROSCOPIC ASSISTED VAGINAL HYSTERECTOMY atttempted;  Surgeon: Allena Katz, MD;  Location: [facility];  Service: Gynecology;  Laterality: N/A;  Attempted Laparoscopic assisted vaginal hysterectomy.   LAPAROSCOPIC LYSIS OF ADHESIONS N/A 06/11/2012   Procedure: LAPAROSCOPIC LYSIS OF ADHESIONS;  Surgeon: Allena Katz, MD;  Location: [facility];  Service: Gynecology;  Laterality: N/A;   SALPINGOOPHORECTOMY Bilateral 06/11/2012   Procedure: SALPINGO OOPHORECTOMY;  Surgeon: Allena Katz, MD;  Location: [facility];  Service: Gynecology;  Laterality: Bilateral;   Patient Active Problem List   Diagnosis Date Noted   Spasticity as late effect of cerebrovascular accident (CVA) 10/21/2021    Abnormal gait due to muscle weakness 10/21/2021   Nausea without vomiting    Spastic hemiparesis (HCC)    History of hypertension    Neurogenic orthostatic hypotension (HCC)    Transaminitis    Right hemiplegia (HCC)    Hemorrhagic stroke (HCC)    Leucocytosis    Essential hypertension    Dyslipidemia    Right hemiparesis (HCC)    ICH (intracerebral hemorrhage) ([place]) 12/01/2020   HYPERLIPIDEMIA 07/06/2009   OBESITY 07/06/2009    REFERRING DIAG: I61.2 (ICD-10-CM) - Nontraumatic hemorrhage of left cerebral hemisphere (HCC) I69.398,R25.2 (ICD-10-CM) - Spasticity as late effect of cerebrovascular accident (CVA) M62.81,R26.9 (ICD-10-  THERAPY DIAG:  Other abnormalities of gait and mobility  Unsteadiness on feet  Muscle weakness (generalized)  Other symptoms and signs involving the nervous system  Rationale for Evaluation and Treatment Rehabilitation  PERTINENT HISTORY: hx of R hemiplegia due to stroke in 10/22- secondary to L frontal ICH and small SAH, anxiety  PRECAUTIONS: fall, R hemi  SUBJECTIVE: Will start pool next week. Wants to do the elliptical again today. Reports that it helps her R knee.   PAIN:  Are you having pain? No   TODAY'S TREATMENT:    GAIT: Gait pattern: step to pattern, decreased arm swing- Right, decreased arm swing- Left, decreased step length- Left, decreased stance time- Right, decreased stride length, decreased hip/knee flexion- Right, decreased ankle dorsiflexion- Right, and poor foot clearance- Right Distance  walked: 345' x 1 plus additional distances  Assistive device utilized: Single point cane and with quad tip  Level of assistance: SBA  Pt requesting to try just a SPC now that her balance has gotten better instead of SPC with quad tip. Worked on incr stance time on RLE, incr step length with LLE, and thinking about driving RLE through with hip/knee flexion to avoid compensatory circumduction. Pt with no issues with balance with SPC. Pt  wearing sneakers with R toe cap today, so pt demonstrating improved foot clearance and not getting her toe caught.   Therapeutic Activity: Pt addressing concerns with RLE stiffness and leg not being able to bend during gait. Educated on this in regards to pt's spasticity after her stroke. Discussed having pt follow-up with Dr. Dagoberto Ligas regarding what she thinks about Baclofen or Botox (they have discussed this before and wanted to wait on Botox). Pt recently tried Baclofen and stopped it as she did not tolerate way. Discussed joining a gym (like [employer]) for access to aerobic equipment such as a seated Stepper or recumbent bike for improved ROM and aerobic activity Gave pt information on [employer] yoga program that is starting in the [place] in March and handout. Pt is very interested in this.      NMR:  Elliptical: Level 1.0 for 6 minutes total, 4 minutes forward, 2 minute backwards, with focusing on knee flexion with RLE and mimicking gait cycle. PT needing to help pt re-position her feet a couple times with RLE for proper alignment. Cues for knee extension prior to bending RLE, especially when going backwards instead of performing in a crouched position. Pt needing min guard/min A when getting off of elliptical. Pt needing a rest break between forwards and backwards due to fatigue.    Standing on blue balance beam for incr weight bearing/closed chain strengthening of RLE and balance on compliant surfaces: With RLE as stance leg; lateral stepping with LLE and back to midline x20 reps, cues for incr foot clearance with LLE for incr stance time on RLE, beginning with UE support > none  With RLE as stance leg; stepping LLE forwards and then right into backwards step for weight shifting x20 reps, beginning with UE support > none     PATIENT EDUCATION: Education details: See therapeutic activity section, starting aquatic next week and cancelling land appt for next week.  Person  educated: Patient and pt's daughter Education method: Explanation, Demonstration  Education comprehension: verbalized/demonstrated understanding     HOME EXERCISE PROGRAM: [id number]  Tall kneeling mini squats with bias towards R side Quadruped hip extensions with leg straight and attempting with knee bent        GOALS: Goals reviewed with patient? Yes   NEW STGS FOR RE-CERT   SHORT TERM GOALS: Target Date: 04/13/2022    Pt will initiate aquatic therapy.  Baseline: Goal status: NEW  2.  Pt will improve TUG time to 23 seconds with no AD in order to demo decrease fall risk. Baseline: with no AD: 28.78 seconds, Goal status: NEW  3.  Pt will improve gait speed with LRAD to at least 1.7 ft/sec in order to demo improved community mobility/decr fall risk Baseline: Gait speed with SPC: 21.47 seconds = 1.52 ft/sec Goal status: NEW  4. Pt will perform cog TUG and manual TUG with LTG written for improved dual tasking.  Baseline:  Goal status: NEW    Updated/ongoing LTGs for re-cert:  LONG TERM GOALS: Target date: 05/11/2022  Pt will be independent with final HEP for land/aquatic therapy in order to build upon functional gains made in therapy. Baseline:  Goal status: ONGOING   2.  Pt will improve TUG time to 20 seconds with no AD in order to demo decrease fall risk. Baseline: with no AD: 28.78 seconds, Goal status: REVISED   3.  Pt will improve gait speed with LRAD vs. No AD to at least 2.0 ft/sec in order to demo improved community mobility. Baseline: Gait speed with SPC: 21.47 seconds = 1.52 ft/sec Goal status: ONGOING  4.  Cog TUG/manual TUG goal to be written for improved dual tasking  Baseline:  Goal status: NEW  5.  Pt will ambulate at least 500' over unlevel grass surfaces with no AD vs. LRAD with supervision in order to demo improved community mobility over unlevel surfaces  Baseline:  Goal status: NEW    6.  Pt will improve SLS on RLE to at least 2-3 seconds in  order to demo improved SLS.  Baseline: <1 second   Goal status: NEW    ASSESSMENT:   CLINICAL IMPRESSION: Today's skilled session focused on NMR/strengthening/balance for RLE and elliptical for aerobic activity and ROM. Pt fatigued on the elliptical and needing a standingr rest break halfway through and after. Pt more challenged with going backwards. Worked on [facility] today with pt with no issues with balance today. Pt does better with gait when wearing shoes with R toe cap for improved foot clearance. Pt tolerated session well. Will be starting aquatic therapy next week. Will continue per POC.   OBJECTIVE IMPAIRMENTS Abnormal gait, decreased activity tolerance, decreased balance, decreased coordination, decreased endurance, decreased mobility, difficulty walking, decreased ROM, decreased strength, impaired flexibility, impaired sensation, impaired tone, and postural dysfunction.    ACTIVITY LIMITATIONS carrying, lifting, bending, and locomotion level   PARTICIPATION LIMITATIONS: driving, community activity, and yard work   PERSONAL FACTORS Behavior pattern, Past/current experiences, Time since onset of injury/illness/exacerbation, and 1-2 comorbidities: anxiety  are also affecting patient's functional outcome.    REHAB POTENTIAL: Good   CLINICAL DECISION MAKING: Stable/uncomplicated   EVALUATION COMPLEXITY: Low   PLAN: PT FREQUENCY: 1-2x/week   PT DURATION: 8 weeks   PLANNED INTERVENTIONS: Therapeutic exercises, Therapeutic activity, Neuromuscular re-education, Balance training, Gait training, Patient/Family education, Self Care, Joint mobilization, Stair training, Vestibular training, Orthotic/Fit training, DME instructions, Aquatic Therapy, Electrical stimulation, Manual therapy, and Re-evaluation   PLAN FOR NEXT SESSION:  Work on RLE strengthening, balance, weight shifting to R. Gait training and improving quality of gait, try Elliptical again. Get pt in for aquatics. Add balance to  HEP as needed. Check STGs next time pt has land therapy   Arliss Journey, PT, DPT  04/07/2022, 3:02 PM

## 2022-04-11 ENCOUNTER — Encounter: Payer: Self-pay | Admitting: Rehabilitation

## 2022-04-11 ENCOUNTER — Ambulatory Visit: Payer: 59 | Admitting: Rehabilitation

## 2022-04-11 ENCOUNTER — Ambulatory Visit: Payer: 59 | Admitting: Physical Therapy

## 2022-04-11 DIAGNOSIS — R29818 Other symptoms and signs involving the nervous system: Secondary | ICD-10-CM

## 2022-04-11 DIAGNOSIS — I69351 Hemiplegia and hemiparesis following cerebral infarction affecting right dominant side: Secondary | ICD-10-CM

## 2022-04-11 DIAGNOSIS — M6281 Muscle weakness (generalized): Secondary | ICD-10-CM

## 2022-04-11 DIAGNOSIS — R2689 Other abnormalities of gait and mobility: Secondary | ICD-10-CM | POA: Diagnosis not present

## 2022-04-11 DIAGNOSIS — R2681 Unsteadiness on feet: Secondary | ICD-10-CM

## 2022-04-11 NOTE — Therapy (Signed)
OUTPATIENT PHYSICAL THERAPY TREATMENT NOTE   Patient Name: Felicia Chen MRN: [id number] DOB:[birth year], 57 y.o., female Today's Date: 04/11/2022  PCP: Aretta Nip, MD REFERRING PROVIDER: Courtney Heys, MD  END OF SESSION:   PT End of Session - 04/11/22 1440     Visit Number 23    Number of Visits 28    Date for PT Re-Evaluation 05/14/22    Authorization Type U[employer],    PT Start Time 1020    PT Stop Time 1100    PT Time Calculation (min) 40 min    Activity Tolerance Patient tolerated treatment well    Behavior During Therapy WFL for tasks assessed/performed              Past Medical History:  Diagnosis Date   Anxiety    Gilbert's syndrome    Past Surgical History:  Procedure Laterality Date   ABDOMINAL HYSTERECTOMY N/A 06/11/2012   Procedure: HYSTERECTOMY ABDOMINAL;  Surgeon: Allena Katz, MD;  Location: [facility];  Service: Gynecology;  Laterality: N/A;   CESAREAN SECTION     x 4   CYSTOSCOPY N/A 06/11/2012   Procedure: CYSTOSCOPY;  Surgeon: Allena Katz, MD;  Location: [facility];  Service: Gynecology;  Laterality: N/A;   HERNIA REPAIR     LAPAROSCOPIC ASSISTED VAGINAL HYSTERECTOMY N/A 06/11/2012   Procedure: LAPAROSCOPIC ASSISTED VAGINAL HYSTERECTOMY atttempted;  Surgeon: Allena Katz, MD;  Location: [facility];  Service: Gynecology;  Laterality: N/A;  Attempted Laparoscopic assisted vaginal hysterectomy.   LAPAROSCOPIC LYSIS OF ADHESIONS N/A 06/11/2012   Procedure: LAPAROSCOPIC LYSIS OF ADHESIONS;  Surgeon: Allena Katz, MD;  Location: [facility];  Service: Gynecology;  Laterality: N/A;   SALPINGOOPHORECTOMY Bilateral 06/11/2012   Procedure: SALPINGO OOPHORECTOMY;  Surgeon: Allena Katz, MD;  Location: [facility];  Service: Gynecology;  Laterality: Bilateral;   Patient Active Problem List   Diagnosis Date Noted   Spasticity as late effect of cerebrovascular accident (CVA) 10/21/2021   Abnormal gait due to muscle weakness 10/21/2021   Nausea  without vomiting    Spastic hemiparesis (HCC)    History of hypertension    Neurogenic orthostatic hypotension (HCC)    Transaminitis    Right hemiplegia (HCC)    Hemorrhagic stroke (HCC)    Leucocytosis    Essential hypertension    Dyslipidemia    Right hemiparesis (HCC)    ICH (intracerebral hemorrhage) ([place]) 12/01/2020   HYPERLIPIDEMIA 07/06/2009   OBESITY 07/06/2009    REFERRING DIAG: I61.2 (ICD-10-CM) - Nontraumatic hemorrhage of left cerebral hemisphere (HCC) I69.398,R25.2 (ICD-10-CM) - Spasticity as late effect of cerebrovascular accident (CVA) M62.81,R26.9 (ICD-10-  THERAPY DIAG:  Other abnormalities of gait and mobility  Unsteadiness on feet  Muscle weakness (generalized)  Other symptoms and signs involving the nervous system  Hemiplegia and hemiparesis following cerebral infarction affecting right dominant side (HCC)  Rationale for Evaluation and Treatment Rehabilitation  PERTINENT HISTORY: hx of R hemiplegia due to stroke in 10/22- secondary to L frontal ICH and small SAH, anxiety  PRECAUTIONS: fall, R hemi  SUBJECTIVE: Pt reports no changes, would like to do the elliptical and is looking into getting one for home.   PAIN:  Are you having pain? No   TODAY'S TREATMENT:   Patient seen for aquatic therapy today.  Treatment took place in water 3.6-4.0 feet deep depending upon activity.  Pt entered and exited the pool via stairs using B rails in alternating fashion.   In approx 4'  dept, had pt ambulate approx 16' x 4 laps without support, backwards x 4 laps (same distance), side stepping with squat (moving arms into abd with lateral step/squat and then add when moving legs in add) x 2 laps again without support and cues for technique.  Some difficulty moving RLE into add during task.  Noted some continued R ankle stiffness with gait, so attempted to stretch at side of pool however due to tone, unable to get foot into position, so had her stand on first step with  BUE support and lower B heels towards floor of pool x 2 sets of 30 secs.  Tolerated well.   NMR:  Standing near side of pool for UE support as needed; kept RLE in stance moving LLE into hip flex (with knee in ext) x 10 reps, then LLE into ext x 10 reps (again knee in ext).  Transitioned to RLE moving into flexion as above x 10 reps, extension x 10 reps.   R then LLE circles moving clockwise x 10 reps then counter clockwise x 10 reps.  All performed with light to no UE support.  Pt does need more intermittent support when in R LE stance.  Ended with Ai Chi modified Balancing posture with single UE support but moving RUE through flex/ext with RLE in stance while LLE moved from flex to ext x 10 reps.     Utilized small 4" step in pool with RLE placed on step (PT did assist somewhat to maintain R foot in correct position/alignment).  Forward step ups with L LE moving into march x 10 reps.  Pt attempted with RUE support, however needing more support so turned around to have LUE support.     Pt requires buoyancy of water for support for reduced fall risk and for unloading/reduced stress on joints (Rt ankle) as pt able to tolerate increased standing (esp R SLS)  and ambulation in water compared to that on land; viscosity of water is needed for resistance for strengthening and current of water provides perturbations for challenge for balance training      PATIENT EDUCATION: Education details: See therapeutic activity section, starting aquatic next week and cancelling land appt for next week.  Person educated: Patient and pt's daughter Education method: Explanation, Demonstration  Education comprehension: verbalized/demonstrated understanding     HOME EXERCISE PROGRAM: [id number]  Tall kneeling mini squats with bias towards R side Quadruped hip extensions with leg straight and attempting with knee bent        GOALS: Goals reviewed with patient? Yes   NEW STGS FOR RE-CERT   SHORT TERM GOALS: Target  Date: 04/13/2022    Pt will initiate aquatic therapy.  Baseline: Goal status: NEW  2.  Pt will improve TUG time to 23 seconds with no AD in order to demo decrease fall risk. Baseline: with no AD: 28.78 seconds, Goal status: NEW  3.  Pt will improve gait speed with LRAD to at least 1.7 ft/sec in order to demo improved community mobility/decr fall risk Baseline: Gait speed with SPC: 21.47 seconds = 1.52 ft/sec Goal status: NEW  4. Pt will perform cog TUG and manual TUG with LTG written for improved dual tasking.  Baseline:  Goal status: NEW    Updated/ongoing LTGs for re-cert:  LONG TERM GOALS: Target date: 05/11/2022   Pt will be independent with final HEP for land/aquatic therapy in order to build upon functional gains made in therapy. Baseline:  Goal status: ONGOING   2.  Pt will improve TUG time to 20 seconds with no AD in order to demo decrease fall risk. Baseline: with no AD: 28.78 seconds, Goal status: REVISED   3.  Pt will improve gait speed with LRAD vs. No AD to at least 2.0 ft/sec in order to demo improved community mobility. Baseline: Gait speed with SPC: 21.47 seconds = 1.52 ft/sec Goal status: ONGOING  4.  Cog TUG/manual TUG goal to be written for improved dual tasking  Baseline:  Goal status: NEW  5.  Pt will ambulate at least 500' over unlevel grass surfaces with no AD vs. LRAD with supervision in order to demo improved community mobility over unlevel surfaces  Baseline:  Goal status: NEW    6.  Pt will improve SLS on RLE to at least 2-3 seconds in order to demo improved SLS.  Baseline: <1 second   Goal status: NEW    ASSESSMENT:   CLINICAL IMPRESSION: Skilled session in pool today to utilize buoyancy of water for safety from falling and viscosity for resistance for strengthening and balance.  Emphasized RLE strengthening both in open and closed chain.  Pt had most difficulty with R SLS and performing R hip ext/knee flex motions, but tolerated session  well.  Would benefit from continued sessions to address remaining deficits.      OBJECTIVE IMPAIRMENTS Abnormal gait, decreased activity tolerance, decreased balance, decreased coordination, decreased endurance, decreased mobility, difficulty walking, decreased ROM, decreased strength, impaired flexibility, impaired sensation, impaired tone, and postural dysfunction.    ACTIVITY LIMITATIONS carrying, lifting, bending, and locomotion level   PARTICIPATION LIMITATIONS: driving, community activity, and yard work   PERSONAL FACTORS Behavior pattern, Past/current experiences, Time since onset of injury/illness/exacerbation, and 1-2 comorbidities: anxiety  are also affecting patient's functional outcome.    REHAB POTENTIAL: Good   CLINICAL DECISION MAKING: Stable/uncomplicated   EVALUATION COMPLEXITY: Low   PLAN: PT FREQUENCY: 1-2x/week   PT DURATION: 8 weeks   PLANNED INTERVENTIONS: Therapeutic exercises, Therapeutic activity, Neuromuscular re-education, Balance training, Gait training, Patient/Family education, Self Care, Joint mobilization, Stair training, Vestibular training, Orthotic/Fit training, DME instructions, Aquatic Therapy, Electrical stimulation, Manual therapy, and Re-evaluation   PLAN FOR NEXT SESSION:  Work on RLE strengthening, balance, weight shifting to R. Gait training and improving quality of gait, try Elliptical again. Get pt in for aquatics. Add balance to HEP as needed. Check STGs next time pt has land therapy   Cameron Sprang, PT, MPT [facility] [address] [place], [place], [address] Phone: [phone]   Fax:  [phone] 04/11/22, 2:41 PM

## 2022-04-18 ENCOUNTER — Encounter: Payer: Self-pay | Admitting: Rehabilitation

## 2022-04-18 ENCOUNTER — Ambulatory Visit: Payer: 59 | Admitting: Physical Therapy

## 2022-04-18 ENCOUNTER — Ambulatory Visit: Payer: 59 | Admitting: Rehabilitation

## 2022-04-18 DIAGNOSIS — R2681 Unsteadiness on feet: Secondary | ICD-10-CM

## 2022-04-18 DIAGNOSIS — R2689 Other abnormalities of gait and mobility: Secondary | ICD-10-CM | POA: Diagnosis not present

## 2022-04-18 DIAGNOSIS — I69351 Hemiplegia and hemiparesis following cerebral infarction affecting right dominant side: Secondary | ICD-10-CM

## 2022-04-18 DIAGNOSIS — M6281 Muscle weakness (generalized): Secondary | ICD-10-CM

## 2022-04-18 NOTE — Therapy (Signed)
OUTPATIENT PHYSICAL THERAPY TREATMENT NOTE   Patient Name: Felicia Chen MRN: [id number] DOB:Feb 10, 1966, 57 y.o., female Today's Date: 04/18/2022  PCP: Aretta Nip, MD REFERRING PROVIDER: Courtney Heys, MD  END OF SESSION:   PT End of Session - 04/18/22 1005     Visit Number 24    Number of Visits 28    Date for PT Re-Evaluation 05/14/22    Authorization Type [employer],    PT Start Time 1015    PT Stop Time 1100    PT Time Calculation (min) 45 min    Activity Tolerance Patient tolerated treatment well    Behavior During Therapy WFL for tasks assessed/performed              Past Medical History:  Diagnosis Date   Anxiety    Gilbert's syndrome    Past Surgical History:  Procedure Laterality Date   ABDOMINAL HYSTERECTOMY N/A 06/11/2012   Procedure: HYSTERECTOMY ABDOMINAL;  Surgeon: Allena Katz, MD;  Location: [facility];  Service: Gynecology;  Laterality: N/A;   CESAREAN SECTION     x 4   CYSTOSCOPY N/A 06/11/2012   Procedure: CYSTOSCOPY;  Surgeon: Allena Katz, MD;  Location: [facility];  Service: Gynecology;  Laterality: N/A;   HERNIA REPAIR     LAPAROSCOPIC ASSISTED VAGINAL HYSTERECTOMY N/A 06/11/2012   Procedure: LAPAROSCOPIC ASSISTED VAGINAL HYSTERECTOMY atttempted;  Surgeon: Allena Katz, MD;  Location: [facility];  Service: Gynecology;  Laterality: N/A;  Attempted Laparoscopic assisted vaginal hysterectomy.   LAPAROSCOPIC LYSIS OF ADHESIONS N/A 06/11/2012   Procedure: LAPAROSCOPIC LYSIS OF ADHESIONS;  Surgeon: Allena Katz, MD;  Location: [facility];  Service: Gynecology;  Laterality: N/A;   SALPINGOOPHORECTOMY Bilateral 06/11/2012   Procedure: SALPINGO OOPHORECTOMY;  Surgeon: Allena Katz, MD;  Location: [facility];  Service: Gynecology;  Laterality: Bilateral;   Patient Active Problem List   Diagnosis Date Noted   Spasticity as late effect of cerebrovascular accident (CVA) 10/21/2021   Abnormal gait due to muscle weakness 10/21/2021   Nausea  without vomiting    Spastic hemiparesis (HCC)    History of hypertension    Neurogenic orthostatic hypotension (HCC)    Transaminitis    Right hemiplegia (HCC)    Hemorrhagic stroke (HCC)    Leucocytosis    Essential hypertension    Dyslipidemia    Right hemiparesis (HCC)    ICH (intracerebral hemorrhage) ([place]) 12/01/2020   HYPERLIPIDEMIA 07/06/2009   OBESITY 07/06/2009    REFERRING DIAG: I61.2 (ICD-10-CM) - Nontraumatic hemorrhage of left cerebral hemisphere (HCC) I69.398,R25.2 (ICD-10-CM) - Spasticity as late effect of cerebrovascular accident (CVA) M62.81,R26.9 (ICD-10-  THERAPY DIAG:  Other abnormalities of gait and mobility  Unsteadiness on feet  Muscle weakness (generalized)  Hemiplegia and hemiparesis following cerebral infarction affecting right dominant side (HCC)  Rationale for Evaluation and Treatment Rehabilitation  PERTINENT HISTORY: hx of R hemiplegia due to stroke in 10/22- secondary to L frontal ICH and small SAH, anxiety  PRECAUTIONS: fall, R hemi  SUBJECTIVE: Pt reports she was sore yesterday, unsure as to reason as she did her normal land HEP.  Doing better now, no pain.   PAIN:  Are you having pain? No   TODAY'S TREATMENT:   Patient seen for aquatic therapy today.  Treatment took place in water 3.6-4.0 feet deep depending upon activity.  Pt entered and exited the pool via stairs using B rails in alternating fashion.  Pool temp at 92 deg.   Warm  up at edge of pool for support:  runner's stretch x 2 sets of 30 secs each side.  PT providing light support to ensure R foot placement.    In approx 4' dept, had pt ambulate approx 16' x 2 laps forwards without support, forwards marching with alt UE flex holding small dumbbells x 4 laps, backwards x 2 laps (same distance)-note some difficulty with toes curling today causing her to land on R toes with backwards step, side stepping with squat (moving arms into abd with lateral step/squat and then add when moving  legs in add) x 2 laps again while holding small set of dumbbells.  Some difficulty moving RLE into add during task.    NMR:  Standing near side of pool but using yellow dumbbells for support; kept RLE in stance moving LLE from hip flexion (knee ext) all the way into hip extension.  RLE moving into hip and knee flexion x 10 reps, knee flex with hip extension x 10 reps.   LLE circles moving clockwise x 10 reps then counter clockwise x 10 reps.       Utilized first step in pool with RLE placed on step (PT did assist somewhat to maintain R foot in correct position/alignment) and RUE support on rail.  Forward step ups with L LE moving onto first step x 10 reps.  Step downs with LUE support with LLE for controlled eccentric movement in RLE x 10 reps.  Lateral step downs with LLE x 10 reps with RUE support.  Had pt place back against pool wall and utilized smaller blue noodle for R hip/knee ext x 15 reps.  Cues for less hip external rotation throughout.    Ended session with core strengthening, stabilization sitting on yellow noodle "straddle position" without UE support then adding alt knee ext x 10 reps>moving LEs in bicycle position x 20 reps.  Then had her balance and keep R knee in flex and move foot outward for hip IR strength x 10 reps.   She did need support for IR exercise, otherwise did well with only intermittent support.    Pt requires buoyancy of water for support for reduced fall risk and for unloading/reduced stress on joints (Rt ankle) as pt able to tolerate increased standing (esp R SLS)  and ambulation in water compared to that on land; viscosity of water is needed for resistance for strengthening and current of water provides perturbations for challenge for balance training      PATIENT EDUCATION: Education details: See therapeutic activity section, starting aquatic next week and cancelling land appt for next week.  Person educated: Patient and pt's daughter Education method: Explanation,  Demonstration  Education comprehension: verbalized/demonstrated understanding     HOME EXERCISE PROGRAM: [id number]  Tall kneeling mini squats with bias towards R side Quadruped hip extensions with leg straight and attempting with knee bent        GOALS: Goals reviewed with patient? Yes   NEW STGS FOR RE-CERT   SHORT TERM GOALS: Target Date: 04/13/2022    Pt will initiate aquatic therapy.  Baseline: Goal status: NEW  2.  Pt will improve TUG time to 23 seconds with no AD in order to demo decrease fall risk. Baseline: with no AD: 28.78 seconds, Goal status: NEW  3.  Pt will improve gait speed with LRAD to at least 1.7 ft/sec in order to demo improved community mobility/decr fall risk Baseline: Gait speed with SPC: 21.47 seconds = 1.52 ft/sec Goal status: NEW  4. Pt will perform cog TUG and manual TUG with LTG written for improved dual tasking.  Baseline:  Goal status: NEW    Updated/ongoing LTGs for re-cert:  LONG TERM GOALS: Target date: 05/11/2022   Pt will be independent with final HEP for land/aquatic therapy in order to build upon functional gains made in therapy. Baseline:  Goal status: ONGOING   2.  Pt will improve TUG time to 20 seconds with no AD in order to demo decrease fall risk. Baseline: with no AD: 28.78 seconds, Goal status: REVISED   3.  Pt will improve gait speed with LRAD vs. No AD to at least 2.0 ft/sec in order to demo improved community mobility. Baseline: Gait speed with SPC: 21.47 seconds = 1.52 ft/sec Goal status: ONGOING  4.  Cog TUG/manual TUG goal to be written for improved dual tasking  Baseline:  Goal status: NEW  5.  Pt will ambulate at least 500' over unlevel grass surfaces with no AD vs. LRAD with supervision in order to demo improved community mobility over unlevel surfaces  Baseline:  Goal status: NEW    6.  Pt will improve SLS on RLE to at least 2-3 seconds in order to demo improved SLS.  Baseline: <1 second   Goal status:  NEW    ASSESSMENT:   CLINICAL IMPRESSION: Skilled session in pool today to utilize buoyancy of water for safety from falling and viscosity for resistance for strengthening and balance.  Emphasized RLE strengthening both in open and closed chain.  Pt had most difficulty with R SLS and performing R hip ext/knee flex motions, but tolerated session well.  Focused on decreasing amount of UE support in this session from wall to dumbbells in which she did well with.       OBJECTIVE IMPAIRMENTS Abnormal gait, decreased activity tolerance, decreased balance, decreased coordination, decreased endurance, decreased mobility, difficulty walking, decreased ROM, decreased strength, impaired flexibility, impaired sensation, impaired tone, and postural dysfunction.    ACTIVITY LIMITATIONS carrying, lifting, bending, and locomotion level   PARTICIPATION LIMITATIONS: driving, community activity, and yard work   PERSONAL FACTORS Behavior pattern, Past/current experiences, Time since onset of injury/illness/exacerbation, and 1-2 comorbidities: anxiety  are also affecting patient's functional outcome.    REHAB POTENTIAL: Good   CLINICAL DECISION MAKING: Stable/uncomplicated   EVALUATION COMPLEXITY: Low   PLAN: PT FREQUENCY: 1-2x/week   PT DURATION: 8 weeks   PLANNED INTERVENTIONS: Therapeutic exercises, Therapeutic activity, Neuromuscular re-education, Balance training, Gait training, Patient/Family education, Self Care, Joint mobilization, Stair training, Vestibular training, Orthotic/Fit training, DME instructions, Aquatic Therapy, Electrical stimulation, Manual therapy, and Re-evaluation   PLAN FOR NEXT SESSION:  Work on RLE strengthening, balance, weight shifting to R. Gait training and improving quality of gait, try Elliptical again. Get pt in for aquatics. Add balance to HEP as needed. Check STGs next time pt has land therapy   Cameron Sprang, PT, MPT [facility] [address] [place], [place], [address] Phone: [phone]   Fax:  [phone] 04/18/22, 11:11 AM

## 2022-04-25 ENCOUNTER — Ambulatory Visit: Payer: 59 | Attending: Physical Medicine and Rehabilitation | Admitting: Physical Therapy

## 2022-04-25 DIAGNOSIS — G8111 Spastic hemiplegia affecting right dominant side: Secondary | ICD-10-CM | POA: Insufficient documentation

## 2022-04-25 DIAGNOSIS — R41842 Visuospatial deficit: Secondary | ICD-10-CM | POA: Insufficient documentation

## 2022-04-25 DIAGNOSIS — R2689 Other abnormalities of gait and mobility: Secondary | ICD-10-CM | POA: Insufficient documentation

## 2022-04-25 DIAGNOSIS — R278 Other lack of coordination: Secondary | ICD-10-CM | POA: Insufficient documentation

## 2022-04-25 DIAGNOSIS — M6281 Muscle weakness (generalized): Secondary | ICD-10-CM | POA: Insufficient documentation

## 2022-04-25 DIAGNOSIS — R2681 Unsteadiness on feet: Secondary | ICD-10-CM | POA: Insufficient documentation

## 2022-04-25 DIAGNOSIS — I69351 Hemiplegia and hemiparesis following cerebral infarction affecting right dominant side: Secondary | ICD-10-CM | POA: Insufficient documentation

## 2022-04-27 ENCOUNTER — Ambulatory Visit: Payer: 59 | Admitting: Physical Therapy

## 2022-04-27 ENCOUNTER — Encounter: Payer: Self-pay | Admitting: Physical Therapy

## 2022-04-27 DIAGNOSIS — R2681 Unsteadiness on feet: Secondary | ICD-10-CM

## 2022-04-27 DIAGNOSIS — G8111 Spastic hemiplegia affecting right dominant side: Secondary | ICD-10-CM | POA: Diagnosis present

## 2022-04-27 DIAGNOSIS — R2689 Other abnormalities of gait and mobility: Secondary | ICD-10-CM

## 2022-04-27 DIAGNOSIS — R278 Other lack of coordination: Secondary | ICD-10-CM | POA: Diagnosis present

## 2022-04-27 DIAGNOSIS — R41842 Visuospatial deficit: Secondary | ICD-10-CM | POA: Diagnosis present

## 2022-04-27 DIAGNOSIS — M6281 Muscle weakness (generalized): Secondary | ICD-10-CM

## 2022-04-27 DIAGNOSIS — I69351 Hemiplegia and hemiparesis following cerebral infarction affecting right dominant side: Secondary | ICD-10-CM | POA: Diagnosis present

## 2022-04-27 NOTE — Therapy (Signed)
OUTPATIENT PHYSICAL THERAPY TREATMENT NOTE   Patient Name: Felicia Chen MRN: [id number] DOB:1966-02-07, 57 y.o., female Today's Date: 04/27/2022  PCP: Aretta Nip, MD REFERRING PROVIDER: Courtney Heys, MD  END OF SESSION:   PT End of Session - 04/27/22 0936     Visit Number 25    Number of Visits 28    Date for PT Re-Evaluation 05/14/22    Authorization Type U[employer],    PT Start Time 0933    PT Stop Time 1015    PT Time Calculation (min) 42 min    Equipment Utilized During Treatment Gait belt    Activity Tolerance Patient tolerated treatment well    Behavior During Therapy WFL for tasks assessed/performed              Past Medical History:  Diagnosis Date   Anxiety    Gilbert's syndrome    Past Surgical History:  Procedure Laterality Date   ABDOMINAL HYSTERECTOMY N/A 06/11/2012   Procedure: HYSTERECTOMY ABDOMINAL;  Surgeon: Allena Katz, MD;  Location: [facility];  Service: Gynecology;  Laterality: N/A;   CESAREAN SECTION     x 4   CYSTOSCOPY N/A 06/11/2012   Procedure: CYSTOSCOPY;  Surgeon: Allena Katz, MD;  Location: [facility];  Service: Gynecology;  Laterality: N/A;   HERNIA REPAIR     LAPAROSCOPIC ASSISTED VAGINAL HYSTERECTOMY N/A 06/11/2012   Procedure: LAPAROSCOPIC ASSISTED VAGINAL HYSTERECTOMY atttempted;  Surgeon: Allena Katz, MD;  Location: [facility];  Service: Gynecology;  Laterality: N/A;  Attempted Laparoscopic assisted vaginal hysterectomy.   LAPAROSCOPIC LYSIS OF ADHESIONS N/A 06/11/2012   Procedure: LAPAROSCOPIC LYSIS OF ADHESIONS;  Surgeon: Allena Katz, MD;  Location: [facility];  Service: Gynecology;  Laterality: N/A;   SALPINGOOPHORECTOMY Bilateral 06/11/2012   Procedure: SALPINGO OOPHORECTOMY;  Surgeon: Allena Katz, MD;  Location: [facility];  Service: Gynecology;  Laterality: Bilateral;   Patient Active Problem List   Diagnosis Date Noted   Spasticity as late effect of cerebrovascular accident (CVA) 10/21/2021   Abnormal  gait due to muscle weakness 10/21/2021   Nausea without vomiting    Spastic hemiparesis (HCC)    History of hypertension    Neurogenic orthostatic hypotension (HCC)    Transaminitis    Right hemiplegia (HCC)    Hemorrhagic stroke (HCC)    Leucocytosis    Essential hypertension    Dyslipidemia    Right hemiparesis (HCC)    ICH (intracerebral hemorrhage) ([place]) 12/01/2020   HYPERLIPIDEMIA 07/06/2009   OBESITY 07/06/2009    REFERRING DIAG: I61.2 (ICD-10-CM) - Nontraumatic hemorrhage of left cerebral hemisphere (HCC) I69.398,R25.2 (ICD-10-CM) - Spasticity as late effect of cerebrovascular accident (CVA) M62.81,R26.9 (ICD-10-  THERAPY DIAG:  Other abnormalities of gait and mobility  Unsteadiness on feet  Muscle weakness (generalized)  Rationale for Evaluation and Treatment Rehabilitation  PERTINENT HISTORY: hx of R hemiplegia due to stroke in 10/22- secondary to L frontal ICH and small SAH, anxiety  PRECAUTIONS: fall, R hemi  SUBJECTIVE: Reports that since she was in the pool, noticed a gnawing pain in her hip. Has been under a lot of stress, pt's mother in law recently passed away. Recently got an elliptical for the house, has not tried it yet. Currently wearing her AFO, feels like it gives her more confidence and feels less anxious.   PAIN:  Are you having pain? No   TODAY'S TREATMENT:   NMR: GAIT: Gait pattern: step to pattern, decreased arm swing- Right, decreased  arm swing- Left, decreased step length- Left, decreased stance time- Right, decreased stride length, decreased hip/knee flexion- Right, decreased ankle dorsiflexion- Right, and poor foot clearance- Right Distance walked: 230' x 1, plus 230' x 1  Assistive device utilized: Single point cane and with quad tip, some with no AD with CGA from therapist  Level of assistance: SBA  Pt brought in her AFO today to clinic due to incr confidence with ambulation. Took it off for below activities with use of T-band  Gait  with theraband wrap assist to facilitate hip and knee flexion and ankle DF around RLE, performed 230', one lap with cane with supervision, 2nd lap with CGA and no AD. Pt demonstrating improved foot clearance and ankle DF   Performed an additional 230' with green theraband around pt's ankle and then posteriorly tied up to pt's gait belt to help facilitate knee flexion. Pt demonstrating improved knee flexion during swing phase of gait and pt also reporting improvements. Performed Forwards/backwards gait down and back x5 reps in // bars with no UE support, pt demonstrating improved R knee flexion with stepping posteriorly with less compensatory hip movements.   Therapeutic Activity: Pt asking about strengthening exercises that she could do for her R hand (asking about using railing with R hand to strengthen it), discussed can ask for referral for OT for pt to get evaluated to determine deficits for exercises for pt. Pt in agreement with OT referral.  Pt asking about doing stairs with just holding onto R railing to strengthen R hand, performed going up forward and down stairs with alternating pattern and CGA. Performed a 2nd set of 4 stairs with step to pattern with pt demonstrating improved balance. Discussed that pt does not have to change the way she does at home - can continue with using both hands for balance for safety with stairs, pt in agreement with plan.  Pt reports that she has purchased an elliptical for home use, educated on proper safety, making sure someone is WITH HER when performing - getting on and off and having a chair nearby in case pt needs to rest. Also starting at 1.0 resistance and doing a total of 5 minutes to start  (as this is what pt has been doing in therapy gym) Pt brought L AFO into clinic today as she feels that it helps improve her walking and asking about wearing it. Discussed that since it helps her anxiety and stress while walking, that It can definitely be beneficial, and  that pt doesn't need to wear it all the time, but just when she feels like she may be walking farther or in a situation where she may be more anxious. Pt felt relieved about this information     PATIENT EDUCATION: Education details: See therapeutic activity section Person educated: Patient  Education method: Explanation, Demonstration  Education comprehension: verbalized/demonstrated understanding     HOME EXERCISE PROGRAM: [id number]  Tall kneeling mini squats with bias towards R side Quadruped hip extensions with leg straight and attempting with knee bent        GOALS: Goals reviewed with patient? Yes   NEW STGS FOR RE-CERT   SHORT TERM GOALS: Target Date: 04/13/2022    Pt will initiate aquatic therapy.  Baseline: Goal status: NEW  2.  Pt will improve TUG time to 23 seconds with no AD in order to demo decrease fall risk. Baseline: with no AD: 28.78 seconds, Goal status: NEW  3.  Pt will improve gait speed with  LRAD to at least 1.7 ft/sec in order to demo improved community mobility/decr fall risk Baseline: Gait speed with SPC: 21.47 seconds = 1.52 ft/sec Goal status: NEW  4. Pt will perform cog TUG and manual TUG with LTG written for improved dual tasking.  Baseline:  Goal status: NEW    Updated/ongoing LTGs for re-cert:  LONG TERM GOALS: Target date: 05/11/2022   Pt will be independent with final HEP for land/aquatic therapy in order to build upon functional gains made in therapy. Baseline:  Goal status: ONGOING   2.  Pt will improve TUG time to 20 seconds with no AD in order to demo decrease fall risk. Baseline: with no AD: 28.78 seconds, Goal status: REVISED   3.  Pt will improve gait speed with LRAD vs. No AD to at least 2.0 ft/sec in order to demo improved community mobility. Baseline: Gait speed with SPC: 21.47 seconds = 1.52 ft/sec Goal status: ONGOING  4.  Cog TUG/manual TUG goal to be written for improved dual tasking  Baseline:  Goal status:  NEW  5.  Pt will ambulate at least 500' over unlevel grass surfaces with no AD vs. LRAD with supervision in order to demo improved community mobility over unlevel surfaces  Baseline:  Goal status: NEW    6.  Pt will improve SLS on RLE to at least 2-3 seconds in order to demo improved SLS.  Baseline: <1 second   Goal status: NEW    ASSESSMENT:   CLINICAL IMPRESSION: Today's skilled session focused on NMR/strengthening/balance for RLE and pt education (see therapeutic activity section above). Used different variations of green theraband wrap to help facilitate hip and knee flexion during gait. What worked best for pt was having green tband around pt's ankle and then tying it to her gait belt for improved knee flexion during forward and backwards walking. Will continue to practice in session.  Will continue per POC.   OBJECTIVE IMPAIRMENTS Abnormal gait, decreased activity tolerance, decreased balance, decreased coordination, decreased endurance, decreased mobility, difficulty walking, decreased ROM, decreased strength, impaired flexibility, impaired sensation, impaired tone, and postural dysfunction.    ACTIVITY LIMITATIONS carrying, lifting, bending, and locomotion level   PARTICIPATION LIMITATIONS: driving, community activity, and yard work   PERSONAL FACTORS Behavior pattern, Past/current experiences, Time since onset of injury/illness/exacerbation, and 1-2 comorbidities: anxiety  are also affecting patient's functional outcome.    REHAB POTENTIAL: Good   CLINICAL DECISION MAKING: Stable/uncomplicated   EVALUATION COMPLEXITY: Low   PLAN: PT FREQUENCY: 1-2x/week   PT DURATION: 8 weeks   PLANNED INTERVENTIONS: Therapeutic exercises, Therapeutic activity, Neuromuscular re-education, Balance training, Gait training, Patient/Family education, Self Care, Joint mobilization, Stair training, Vestibular training, Orthotic/Fit training, DME instructions, Aquatic Therapy, Electrical  stimulation, Manual therapy, and Re-evaluation   PLAN FOR NEXT SESSION:  Work on RLE strengthening, balance, weight shifting to R. Gait training and improving quality of gait, try Elliptical again. Get pt in for aquatics. Add balance to HEP as needed. Next time pt has land therapy, will need to check goals and do re-cert. Continue gait with tband to help with knee flexion  Arliss Journey, PT, DPT  04/27/2022, 2:38 PM

## 2022-05-02 ENCOUNTER — Encounter: Payer: Self-pay | Admitting: Rehabilitation

## 2022-05-02 ENCOUNTER — Ambulatory Visit: Payer: 59 | Admitting: Rehabilitation

## 2022-05-02 ENCOUNTER — Ambulatory Visit: Payer: 59 | Admitting: Physical Therapy

## 2022-05-02 DIAGNOSIS — M6281 Muscle weakness (generalized): Secondary | ICD-10-CM

## 2022-05-02 DIAGNOSIS — R2681 Unsteadiness on feet: Secondary | ICD-10-CM

## 2022-05-02 DIAGNOSIS — I69351 Hemiplegia and hemiparesis following cerebral infarction affecting right dominant side: Secondary | ICD-10-CM

## 2022-05-02 DIAGNOSIS — R2689 Other abnormalities of gait and mobility: Secondary | ICD-10-CM

## 2022-05-02 NOTE — Therapy (Signed)
OUTPATIENT PHYSICAL THERAPY TREATMENT NOTE   Patient Name: Felicia Chen MRN: [id number] DOB:07-03-1965, 57 y.o., female Today's Date: 05/02/2022  PCP: Aretta Nip, MD REFERRING PROVIDER: Courtney Heys, MD  END OF SESSION:   PT End of Session - 05/02/22 0818     Visit Number 26    Number of Visits 28    Date for PT Re-Evaluation 05/14/22    Authorization Type UHC,    PT Start Time 1018    PT Stop Time 1100    PT Time Calculation (min) 42 min    Equipment Utilized During Treatment Gait belt    Activity Tolerance Patient tolerated treatment well    Behavior During Therapy WFL for tasks assessed/performed              Past Medical History:  Diagnosis Date   Anxiety    Gilbert's syndrome    Past Surgical History:  Procedure Laterality Date   ABDOMINAL HYSTERECTOMY N/A 06/11/2012   Procedure: HYSTERECTOMY ABDOMINAL;  Surgeon: Allena Katz, MD;  Location: [facility];  Service: Gynecology;  Laterality: N/A;   CESAREAN SECTION     x 4   CYSTOSCOPY N/A 06/11/2012   Procedure: CYSTOSCOPY;  Surgeon: Allena Katz, MD;  Location: [facility];  Service: Gynecology;  Laterality: N/A;   HERNIA REPAIR     LAPAROSCOPIC ASSISTED VAGINAL HYSTERECTOMY N/A 06/11/2012   Procedure: LAPAROSCOPIC ASSISTED VAGINAL HYSTERECTOMY atttempted;  Surgeon: Allena Katz, MD;  Location: [facility];  Service: Gynecology;  Laterality: N/A;  Attempted Laparoscopic assisted vaginal hysterectomy.   LAPAROSCOPIC LYSIS OF ADHESIONS N/A 06/11/2012   Procedure: LAPAROSCOPIC LYSIS OF ADHESIONS;  Surgeon: Allena Katz, MD;  Location: [facility];  Service: Gynecology;  Laterality: N/A;   SALPINGOOPHORECTOMY Bilateral 06/11/2012   Procedure: SALPINGO OOPHORECTOMY;  Surgeon: Allena Katz, MD;  Location: [facility];  Service: Gynecology;  Laterality: Bilateral;   Patient Active Problem List   Diagnosis Date Noted   Spasticity as late effect of cerebrovascular accident (CVA) 10/21/2021    Abnormal gait due to muscle weakness 10/21/2021   Nausea without vomiting    Spastic hemiparesis (HCC)    History of hypertension    Neurogenic orthostatic hypotension (HCC)    Transaminitis    Right hemiplegia (HCC)    Hemorrhagic stroke (HCC)    Leucocytosis    Essential hypertension    Dyslipidemia    Right hemiparesis (HCC)    ICH (intracerebral hemorrhage) ([place]) 12/01/2020   HYPERLIPIDEMIA 07/06/2009   OBESITY 07/06/2009    REFERRING DIAG: I61.2 (ICD-10-CM) - Nontraumatic hemorrhage of left cerebral hemisphere (HCC) I69.398,R25.2 (ICD-10-CM) - Spasticity as late effect of cerebrovascular accident (CVA) M62.81,R26.9 (ICD-10-  THERAPY DIAG:  Other abnormalities of gait and mobility  Unsteadiness on feet  Muscle weakness (generalized)  Hemiplegia and hemiparesis following cerebral infarction affecting right dominant side (HCC)  Rationale for Evaluation and Treatment Rehabilitation  PERTINENT HISTORY: hx of R hemiplegia due to stroke in 10/22- secondary to L frontal ICH and small SAH, anxiety  PRECAUTIONS: fall, R hemi  SUBJECTIVE: Reports no changes, has shoes on for session today.  Got on her personal elliptical yesterday.  Did ok but felt nauseated following.    PAIN:  Are you having pain? No   TODAY'S TREATMENT:   Patient seen for aquatic therapy today.  Treatment took place in water 3.6-4.0 feet deep depending upon activity.  Pt entered and exited the pool via stairs using B rails in alternating  fashion.  Pool temp at 92 deg.   Stretching at end of session: calf stretch with R toes placed in curve of pool wall with knee extension x 2 sets of 30 secs.   In approx 4' dept, had pt ambulate approx 16' x 2 laps forwards without support, backwards x 2 laps (same distance), Side stepping x 2 laps.  Ended walking in pool with marching forward with alt UE flex with small dumbbells x4 laps with min/guard to min A needed initially for proper alignment and increased WB  through LLE.  Note that coordination of UE movements difficult today so had her just hold dumbells without moving during last marching reps.    NMR:  Standing near side of pool: kept LLE in stance moving RLE into hip flex (with knee in ext) and ext x 10 reps (again knee in ext), holding B yellow dumbells for support only.  R then LLE circles moving clockwise x 10 reps then counter clockwise x 10 reps.  All performed with light to no UE support.  Pt does need more intermittent support when in L LE stance.  Ai chi postures: Enclosing, Gathering (needed assist for maintaining position of RLE throughout), and modified balancing postures all x 10 reps and cues for technique.  Pt had most difficulty with gathering with cues for improved core activation.    Utilized small 4" step in pool with RLE placed on step.   Forward step ups x 10 reps with light support only with RUE. Min tactile and verbal cues for more hip extension to allow better alignment and balance.  LLE step downs (to allow RLE controlled eccentric movement and to encourage knee flexion) x 10 reps with light UE support on wall and from PT.       Pt requires buoyancy of water for support for reduced fall risk and for unloading/reduced stress on joints (Rt ankle) as pt able to tolerate increased standing (esp R SLS)  and ambulation in water compared to that on land; viscosity of water is needed for resistance for strengthening and current of water provides perturbations for challenge for balance training      PATIENT EDUCATION: Education details: See therapeutic activity section, starting aquatic next week and cancelling land appt for next week.  Person educated: Patient and pt's daughter Education method: Explanation, Demonstration  Education comprehension: verbalized/demonstrated understanding     HOME EXERCISE PROGRAM: [id number]  Tall kneeling mini squats with bias towards R side Quadruped hip extensions with leg straight and attempting  with knee bent        GOALS: Goals reviewed with patient? Yes   NEW STGS FOR RE-CERT   SHORT TERM GOALS: Target Date: 04/13/2022    Pt will initiate aquatic therapy.  Baseline: Goal status: NEW  2.  Pt will improve TUG time to 23 seconds with no AD in order to demo decrease fall risk. Baseline: with no AD: 28.78 seconds, Goal status: NEW  3.  Pt will improve gait speed with LRAD to at least 1.7 ft/sec in order to demo improved community mobility/decr fall risk Baseline: Gait speed with SPC: 21.47 seconds = 1.52 ft/sec Goal status: NEW  4. Pt will perform cog TUG and manual TUG with LTG written for improved dual tasking.  Baseline:  Goal status: NEW    Updated/ongoing LTGs for re-cert:  LONG TERM GOALS: Target date: 05/11/2022   Pt will be independent with final HEP for land/aquatic therapy in order to build upon functional gains made  in therapy. Baseline:  Goal status: ONGOING   2.  Pt will improve TUG time to 20 seconds with no AD in order to demo decrease fall risk. Baseline: with no AD: 28.78 seconds, Goal status: REVISED   3.  Pt will improve gait speed with LRAD vs. No AD to at least 2.0 ft/sec in order to demo improved community mobility. Baseline: Gait speed with SPC: 21.47 seconds = 1.52 ft/sec Goal status: ONGOING  4.  Cog TUG/manual TUG goal to be written for improved dual tasking  Baseline:  Goal status: NEW  5.  Pt will ambulate at least 500' over unlevel grass surfaces with no AD vs. LRAD with supervision in order to demo improved community mobility over unlevel surfaces  Baseline:  Goal status: NEW    6.  Pt will improve SLS on RLE to at least 2-3 seconds in order to demo improved SLS.  Baseline: <1 second   Goal status: NEW    ASSESSMENT:   CLINICAL IMPRESSION: Skilled session in pool today to utilize buoyancy of water for safety from falling and viscosity for resistance for strengthening and balance.  Emphasized RLE strengthening both in open  and closed chain.  Pt had most difficulty with R SLS and performing R hip ext/knee flex motions, but tolerated session well.  Continue to focus on decreasing amount of UE support in this session and utilize RUE when able for more strengthening.  Discussed benefits of OT aquatic sessions and will talk with primary PT on getting these scheduled.   OBJECTIVE IMPAIRMENTS Abnormal gait, decreased activity tolerance, decreased balance, decreased coordination, decreased endurance, decreased mobility, difficulty walking, decreased ROM, decreased strength, impaired flexibility, impaired sensation, impaired tone, and postural dysfunction.    ACTIVITY LIMITATIONS carrying, lifting, bending, and locomotion level   PARTICIPATION LIMITATIONS: driving, community activity, and yard work   PERSONAL FACTORS Behavior pattern, Past/current experiences, Time since onset of injury/illness/exacerbation, and 1-2 comorbidities: anxiety  are also affecting patient's functional outcome.    REHAB POTENTIAL: Good   CLINICAL DECISION MAKING: Stable/uncomplicated   EVALUATION COMPLEXITY: Low   PLAN: PT FREQUENCY: 1-2x/week   PT DURATION: 8 weeks   PLANNED INTERVENTIONS: Therapeutic exercises, Therapeutic activity, Neuromuscular re-education, Balance training, Gait training, Patient/Family education, Self Care, Joint mobilization, Stair training, Vestibular training, Orthotic/Fit training, DME instructions, Aquatic Therapy, Electrical stimulation, Manual therapy, and Re-evaluation   PLAN FOR NEXT SESSION:  Work on RLE strengthening, balance, weight shifting to R. Gait training and improving quality of gait, try Elliptical again. Get pt in for aquatics. Add balance to HEP as needed. Check STGs next time pt has land therapy   Cameron Sprang, PT, MPT [facility] [address] [place], [place], [address] Phone: [phone]   Fax:  [phone] 05/02/22, 11:19 AM

## 2022-05-03 ENCOUNTER — Telehealth: Payer: Self-pay | Admitting: Physical Therapy

## 2022-05-03 DIAGNOSIS — G8111 Spastic hemiplegia affecting right dominant side: Secondary | ICD-10-CM

## 2022-05-03 NOTE — Telephone Encounter (Signed)
Have sent in referral- thanks- ML

## 2022-05-03 NOTE — Telephone Encounter (Signed)
Dr. Dagoberto Ligas, [place] has been seen by PT at [facility].  The patient would benefit from an OT evaluation for RUE strengthening from CVA.   If you agree, please place an order in [facility]que in [facility] or fax the order to [phone].  Thank you, Janann August, PT, DPT 05/03/22 10:22 AM    [facility] [place] [address] [place], [place]  [address] Phone:  [phone] Fax:  [phone]

## 2022-05-05 ENCOUNTER — Ambulatory Visit: Payer: 59 | Admitting: Occupational Therapy

## 2022-05-05 ENCOUNTER — Encounter: Payer: Self-pay | Admitting: Occupational Therapy

## 2022-05-05 DIAGNOSIS — M6281 Muscle weakness (generalized): Secondary | ICD-10-CM | POA: Diagnosis not present

## 2022-05-05 DIAGNOSIS — G8111 Spastic hemiplegia affecting right dominant side: Secondary | ICD-10-CM

## 2022-05-05 DIAGNOSIS — R41842 Visuospatial deficit: Secondary | ICD-10-CM

## 2022-05-05 DIAGNOSIS — R278 Other lack of coordination: Secondary | ICD-10-CM

## 2022-05-05 DIAGNOSIS — I69351 Hemiplegia and hemiparesis following cerebral infarction affecting right dominant side: Secondary | ICD-10-CM

## 2022-05-05 NOTE — Therapy (Unsigned)
OUTPATIENT OCCUPATIONAL THERAPY NEURO EVALUATION  Patient Name: Felicia Chen MRN: [id number] DOB:1966/02/09, 57 y.o., female Today's Date: 05/05/2022  PCP: Aretta Nip, MD REFERRING PROVIDER: Courtney Heys, MD  END OF SESSION:  OT End of Session - 05/05/22 1416     Visit Number 1    Number of Visits 7    Date for OT Re-Evaluation 06/30/22    Authorization Type UHC - VL 40    OT Start Time 1026    OT Stop Time 1104    OT Time Calculation (min) 38 min    Activity Tolerance Patient tolerated treatment well    Behavior During Therapy WFL for tasks assessed/performed;Anxious             Past Medical History:  Diagnosis Date   Anxiety    Gilbert's syndrome    Past Surgical History:  Procedure Laterality Date   ABDOMINAL HYSTERECTOMY N/A 06/11/2012   Procedure: HYSTERECTOMY ABDOMINAL;  Surgeon: Allena Katz, MD;  Location: [facility];  Service: Gynecology;  Laterality: N/A;   CESAREAN SECTION     x 4   CYSTOSCOPY N/A 06/11/2012   Procedure: CYSTOSCOPY;  Surgeon: Allena Katz, MD;  Location: [facility];  Service: Gynecology;  Laterality: N/A;   HERNIA REPAIR     LAPAROSCOPIC ASSISTED VAGINAL HYSTERECTOMY N/A 06/11/2012   Procedure: LAPAROSCOPIC ASSISTED VAGINAL HYSTERECTOMY atttempted;  Surgeon: Allena Katz, MD;  Location: [facility];  Service: Gynecology;  Laterality: N/A;  Attempted Laparoscopic assisted vaginal hysterectomy.   LAPAROSCOPIC LYSIS OF ADHESIONS N/A 06/11/2012   Procedure: LAPAROSCOPIC LYSIS OF ADHESIONS;  Surgeon: Allena Katz, MD;  Location: [facility];  Service: Gynecology;  Laterality: N/A;   SALPINGOOPHORECTOMY Bilateral 06/11/2012   Procedure: SALPINGO OOPHORECTOMY;  Surgeon: Allena Katz, MD;  Location: [facility];  Service: Gynecology;  Laterality: Bilateral;   Patient Active Problem List   Diagnosis Date Noted   Spasticity as late effect of cerebrovascular accident (CVA) 10/21/2021   Abnormal gait due to muscle weakness  10/21/2021   Nausea without vomiting    Spastic hemiparesis (HCC)    History of hypertension    Neurogenic orthostatic hypotension (HCC)    Transaminitis    Right hemiplegia (HCC)    Hemorrhagic stroke (HCC)    Leucocytosis    Essential hypertension    Dyslipidemia    Right hemiparesis (HCC)    ICH (intracerebral hemorrhage) ([place]) 12/01/2020   HYPERLIPIDEMIA 07/06/2009   OBESITY 07/06/2009    ONSET DATE: 12/01/2020  REFERRING DIAG: G81.11 (ICD-10-CM) - Right spastic hemiparesis  THERAPY DIAG:  Muscle weakness (generalized)  Hemiplegia and hemiparesis following cerebral infarction affecting right dominant side (HCC)  Right spastic hemiparesis (HCC)  Other lack of coordination  Visuospatial deficit  Rationale for Evaluation and Treatment: Rehabilitation  SUBJECTIVE:   SUBJECTIVE STATEMENT: Pt reports she has not been the most compliant with her RUE HEP since last being seen by OT and would like to work on improving RUE function again.  Pt accompanied by:  [name]  PERTINENT HISTORY: hx of R hemiplegia due to stroke in 10/22- secondary to L frontal ICH and small SAH, anxiety.    OT eval for RUE strengthening after stroke  PRECAUTIONS: Fall  WEIGHT BEARING RESTRICTIONS: No  PAIN:  Are you having pain? No  FALLS: Has patient fallen in last 6 months? Yes. 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; was driving  PATIENT GOALS: shoulder mobility, strength, use of RUE  OBJECTIVE:   HAND DOMINANCE: Right  ADLs: Overall ADLs: SBA Equipment: Shower seat with back, Walk  in shower, bed side commode, and [place]  IADLs: Shopping: min A Light housekeeping: able to do dishes; requires assistance for vacuuming but has light vacuum that she has not yet tried to use Meal Prep: light meal prep Community mobility: dependent Medication management: mod I with pill boxes Financial management: I Handwriting: 100% legible improved since onset of stroke  MOBILITY STATUS: Needs Assist: SBA to CGA  with use of Hurrycane  ACTIVITY TOLERANCE: Activity tolerance: good  UPPER EXTREMITY ROM:    BUE AROM WFL  UPPER EXTREMITY MMT:     BUE - WFL with exception to R shoulder flexion 4-/5  HAND FUNCTION: Grip strength: Right: 57 lbs; Left: 64.5 lbs  COORDINATION: 9 Hole Peg test: Right: 45 sec; Left: 27 sec  SENSATION: WFL  EDEMA: mild on R  MUSCLE TONE: RUE: Within functional limits  COGNITION: Overall cognitive status: History of cognitive impairments - at baseline  VISION: Subjective report: reports vision is back; denies double vision  PERCEPTION: Improved attention to R side  PRAXIS: WFL  OBSERVATIONS: Pt appears well-kept and has glasses and mask donned. Uses cane with altered gait though no LOB. Pt anxious and provides narrative answers to yew/no questions. Requires cues to remain on task/topic at times.    TODAY'S TREATMENT:                                                                                                                               N/a - eval only  PATIENT EDUCATION: Education details: OT POC Person educated: Patient and Child(ren) Education method: Explanation Education comprehension: verbalized understanding  HOME EXERCISE PROGRAM: Not yet initiated   GOALS:  SHORT TERM GOALS: Target date: 06/02/2022   Patient will be independent with RUE theraband HEP. Baseline: no recall Goal status: INITIAL  2.  Pt will independently recall at least 2 tremor reduction strategies.  Baseline:  Goal status: INITIAL  LONG TERM  GOALS: Target date: 06/30/2022  Patient will demonstrate updated RUE HEP with 25% verbal cues or less for proper execution. Baseline:  Goal status: INITIAL  2.  Patient will complete nine-hole peg with use of R in 35 seconds or less. Baseline: 45 seconds Goal status: INITIAL  3.  Pt will demonstrate ability to place 3 lb item on overhead shelf using RUE and good safety. Baseline: unable Goal status: INITIAL  4.  OT to set appropriate outcome measure based goal as needed.  Baseline:  Goal status: INITIAL  ASSESSMENT:  CLINICAL IMPRESSION: Patient is a 57 y.o. female who was seen today for occupational therapy evaluation for RUE strengthening after stroke (hemorrhagic; 2022). Hx includes HLD, HTN, and R hemiplegia. Patient currently presents near baseline level of functioning but experiences regression with RUE functional use with impairments as noted below. Pt would benefit from skilled OT services in the outpatient setting to work on impairments as noted below to help pt return to PLOF as able.    PERFORMANCE DEFICITS: in functional skills including ADLs, IADLs, coordination, sensation, edema, tone, ROM, strength, pain, Fine motor control, Gross motor control, mobility, balance, endurance, and UE functional use.  IMPAIRMENTS: are limiting patient from ADLs, IADLs, work, leisure, and social participation.   CO-MORBIDITIES: may have co-morbidities  that affects occupational performance. Patient will benefit from skilled OT to address above impairments and improve overall function.  MODIFICATION OR ASSISTANCE TO COMPLETE EVALUATION: Min-Moderate modification of tasks or assist with assess necessary to complete an evaluation.  OT OCCUPATIONAL PROFILE AND HISTORY: Problem focused assessment: Including review of records relating to presenting problem.  CLINICAL DECISION MAKING: LOW - limited treatment options, no task modification necessary  REHAB POTENTIAL: Fair given chronicity of  stroke  EVALUATION COMPLEXITY: Low    PLAN:  OT FREQUENCY: 1x/week  OT DURATION:  6 sessions  PLANNED INTERVENTIONS: self care/ADL training, therapeutic exercise, therapeutic activity, neuromuscular re-education, manual therapy, functional mobility training, ultrasound, paraffin, fluidotherapy, moist heat, contrast bath, patient/family education, visual/perceptual remediation/compensation, energy conservation, coping strategies training, DME and/or AE instructions, and Re-evaluation  RECOMMENDED OTHER SERVICES: none at this time  CONSULTED AND AGREED WITH PLAN OF CARE: Patient and family member/caregiver  PLAN FOR NEXT SESSION: Review theraband HEP from previous episode; coordination HEP; tremor reduction strategies   Dennis Bast, OT 05/05/2022, 2:21 PM

## 2022-05-09 ENCOUNTER — Ambulatory Visit: Payer: 59 | Admitting: Physical Therapy

## 2022-05-09 ENCOUNTER — Ambulatory Visit: Payer: 59 | Admitting: Rehabilitation

## 2022-05-09 DIAGNOSIS — R2681 Unsteadiness on feet: Secondary | ICD-10-CM

## 2022-05-09 DIAGNOSIS — M6281 Muscle weakness (generalized): Secondary | ICD-10-CM | POA: Diagnosis not present

## 2022-05-09 DIAGNOSIS — R2689 Other abnormalities of gait and mobility: Secondary | ICD-10-CM

## 2022-05-09 DIAGNOSIS — I69351 Hemiplegia and hemiparesis following cerebral infarction affecting right dominant side: Secondary | ICD-10-CM

## 2022-05-09 NOTE — Therapy (Addendum)
OUTPATIENT PHYSICAL THERAPY TREATMENT NOTE/RE-CERT   Patient Name: Felicia Chen MRN: [id number] DOB:May 18, 1965, 57 y.o., female Today's Date: 05/09/2022  PCP: Aretta Nip, MD REFERRING PROVIDER: Courtney Heys, MD  END OF SESSION:     05/09/22 0833  PT Visits / Re-Eval  Visit Number 27  Number of Visits 35 (per re-cert on [id number])  Date for PT Re-Evaluation 07/11/22  Authorization  Authorization Type [employer],  PT Time Calculation  PT Start Time 1016  PT Stop Time 1100  PT Time Calculation (min) 44 min  PT - End of Session  Equipment Utilized During Treatment Gait belt  Activity Tolerance Patient tolerated treatment well  Behavior During Therapy WFL for tasks assessed/performed        Past Medical History:  Diagnosis Date   Anxiety    Gilbert's syndrome    Past Surgical History:  Procedure Laterality Date   ABDOMINAL HYSTERECTOMY N/A 06/11/2012   Procedure: HYSTERECTOMY ABDOMINAL;  Surgeon: Allena Katz, MD;  Location: [facility];  Service: Gynecology;  Laterality: N/A;   CESAREAN SECTION     x 4   CYSTOSCOPY N/A 06/11/2012   Procedure: CYSTOSCOPY;  Surgeon: Allena Katz, MD;  Location: [facility];  Service: Gynecology;  Laterality: N/A;   HERNIA REPAIR     LAPAROSCOPIC ASSISTED VAGINAL HYSTERECTOMY N/A 06/11/2012   Procedure: LAPAROSCOPIC ASSISTED VAGINAL HYSTERECTOMY atttempted;  Surgeon: Allena Katz, MD;  Location: [facility];  Service: Gynecology;  Laterality: N/A;  Attempted Laparoscopic assisted vaginal hysterectomy.   LAPAROSCOPIC LYSIS OF ADHESIONS N/A 06/11/2012   Procedure: LAPAROSCOPIC LYSIS OF ADHESIONS;  Surgeon: Allena Katz, MD;  Location: [facility];  Service: Gynecology;  Laterality: N/A;   SALPINGOOPHORECTOMY Bilateral 06/11/2012   Procedure: SALPINGO OOPHORECTOMY;  Surgeon: Allena Katz, MD;  Location: [facility];  Service: Gynecology;  Laterality: Bilateral;   Patient Active Problem List   Diagnosis Date Noted   Spasticity  as late effect of cerebrovascular accident (CVA) 10/21/2021   Abnormal gait due to muscle weakness 10/21/2021   Nausea without vomiting    Spastic hemiparesis (HCC)    History of hypertension    Neurogenic orthostatic hypotension (HCC)    Transaminitis    Right hemiplegia (HCC)    Hemorrhagic stroke (HCC)    Leucocytosis    Essential hypertension    Dyslipidemia    Right hemiparesis (HCC)    ICH (intracerebral hemorrhage) ([place]) 12/01/2020   HYPERLIPIDEMIA 07/06/2009   OBESITY 07/06/2009    REFERRING DIAG: I61.2 (ICD-10-CM) - Nontraumatic hemorrhage of left cerebral hemisphere (HCC) I69.398,R25.2 (ICD-10-CM) - Spasticity as late effect of cerebrovascular accident (CVA) M62.81,R26.9 (ICD-10-  THERAPY DIAG:  Muscle weakness (generalized)  Hemiplegia and hemiparesis following cerebral infarction affecting right dominant side (HCC)  Other abnormalities of gait and mobility  Unsteadiness on feet  Rationale for Evaluation and Treatment Rehabilitation  PERTINENT HISTORY: hx of R hemiplegia due to stroke in 10/22- secondary to L frontal ICH and small SAH, anxiety  PRECAUTIONS: fall, R hemi  SUBJECTIVE: Reports she is loving her elliptical and can tell a difference from day to day now.  She has questions about getting into pool with OT, this PT will contact OT about this.      PAIN:  Are you having pain? No   TODAY'S TREATMENT:   Patient seen for aquatic therapy today.  Treatment took place in water 3.6-4.0 feet deep depending upon activity.  Pt entered and exited the pool via stairs using B  rails in alternating fashion.  Pool temp at 92 deg.   Stretching at beginning of session: calf stretch with R toes placed in curve of pool wall with knee extension x 2 sets of 30 secs.   In approx 4' dept, had pt ambulate approx 18' x 2 laps forwards without support, backwards x 2 laps (same distance),  side stepping with squat and UE abd/add with small dumbbells.  Ended walking in pool with  marching forward with alt UE flex with small dumbbells x4 laps with min/guard to min A needed initially for proper alignment and increased WB through LLE. Marked improvement in coordination today with UE/LE movements.     NMR:  Standing near side of pool: kept LLE in stance moving RLE into hip flex (with knee in ext) and ext x 10 reps, only placing a few fingers on wall.  R then LLE circles moving clockwise x 10 reps then counter clockwise x 10 reps (added ankle fins today for more resistance).  All performed with light to no UE support.  Pt does need more intermittent support when in L LE stance.  Ai chi postures: Modified "Balancing" with one arm on wall for support while moving opposite arm and same side leg.  Did with ankle fins as well for resistance.  Pt demo'd improved ROM in RLE and improved ability to stand on RLE with less support.   Single leg squat on RLE x 10 reps to break extension tone pattern in RLE.  BLE heel raises x 10 reps, squats with support of barbells but heel raises without support.     Utilized small 4" step in pool with RLE placed on step.   Forward step ups x 10 reps with light support only with RUE. Added LLE march x 10 reps.  Min tactile and verbal cues for more hip extension to allow better alignment and balance.     Pt requires buoyancy of water for support for reduced fall risk and for unloading/reduced stress on joints (Rt ankle) as pt able to tolerate increased standing (esp R SLS)  and ambulation in water compared to that on land; viscosity of water is needed for resistance for strengthening and current of water provides perturbations for challenge for balance training      PATIENT EDUCATION: Education details: Continue HEP  Person educated: Patient and pt's daughter Education method: Explanation, Demonstration  Education comprehension: verbalized/demonstrated understanding     HOME EXERCISE PROGRAM: [id number]  Tall kneeling mini squats with bias towards R  side Quadruped hip extensions with leg straight and attempting with knee bent        GOALS: Goals reviewed with patient? Yes   UPDATED GOAL DATES FOR RE-CERT: **Unable to assess STGs/LTGs due to pt being in the pool, updated/revised goal dates for re-cert   SHORT TERM GOALS: ALL STGS = 06/09/2022    Pt will initiate aquatic therapy.  Baseline: Goal status: MET  2.  Pt will improve TUG time to 23 seconds with no AD in order to demo decrease fall risk. Baseline: with no AD: 28.78 seconds, Goal status: NEW  3.  Pt will improve gait speed with LRAD to at least 1.7 ft/sec in order to demo improved community mobility/decr fall risk Baseline: Gait speed with SPC: 21.47 seconds = 1.52 ft/sec Goal status: NEW   LONG TERM GOALS: ALL LTGS = 07/07/2022    Pt will be independent with final HEP for land/aquatic therapy in order to build upon functional gains made in  therapy. Baseline:  Goal status: ONGOING   2.  Pt will improve TUG time to 20 seconds with no AD in order to demo decrease fall risk. Baseline: with no AD: 28.78 seconds, Goal status: REVISED   3.  Pt will improve gait speed with LRAD vs. No AD to at least 2.0 ft/sec in order to demo improved community mobility. Baseline: Gait speed with SPC: 21.47 seconds = 1.52 ft/sec Goal status: ONGOING   4.  Pt will ambulate at least 500' over unlevel grass surfaces with no AD vs. LRAD with supervision in order to demo improved community mobility over unlevel surfaces  Baseline:  Goal status: NEW    5.  Pt will improve SLS on RLE to at least 2-3 seconds in order to demo improved SLS.  Baseline: <1 second   Goal status: NEW    ASSESSMENT:   CLINICAL IMPRESSION: Skilled session in pool today to utilize buoyancy of water for safety from falling and viscosity for resistance for strengthening and balance.  Emphasized RLE strengthening both in open and closed chain.  Pt demonstrating improvement in RLE ROM and ability to maintain in  R SLS vs previous sessions.  Continue to focus on decreasing amount of UE support in this session and utilize RUE when able for more strengthening.  Will plan to see her one time per week through most of march and DC so she can work on her own either at [employer] or [place].    Primary PT, Janann August, PT, DPT updated STG/LTG date for re-cert and to cover remainder of aquatic visits through end of March as well as additional land visit for D/C.   OBJECTIVE IMPAIRMENTS Abnormal gait, decreased activity tolerance, decreased balance, decreased coordination, decreased endurance, decreased mobility, difficulty walking, decreased ROM, decreased strength, impaired flexibility, impaired sensation, impaired tone, and postural dysfunction.    ACTIVITY LIMITATIONS carrying, lifting, bending, and locomotion level   PARTICIPATION LIMITATIONS: driving, community activity, and yard work   PERSONAL FACTORS Behavior pattern, Past/current experiences, Time since onset of injury/illness/exacerbation, and 1-2 comorbidities: anxiety  are also affecting patient's functional outcome.    REHAB POTENTIAL: Good   CLINICAL DECISION MAKING: Stable/uncomplicated   EVALUATION COMPLEXITY: Low   PLAN: PT FREQUENCY: 1-2x/week   PT DURATION: 8 weeks   PLANNED INTERVENTIONS: Therapeutic exercises, Therapeutic activity, Neuromuscular re-education, Balance training, Gait training, Patient/Family education, Self Care, Joint mobilization, Stair training, Vestibular training, Orthotic/Fit training, DME instructions, Aquatic Therapy, Electrical stimulation, Manual therapy, and Re-evaluation   PLAN FOR NEXT SESSION:  Work on RLE strengthening, balance, weight shifting to R. Gait training and improving quality of gait, try Elliptical again. Get pt in for aquatics. Add balance to HEP as needed. Check STGs next time pt has land therapy   Cameron Sprang, PT, MPT [facility] [address] [place], [place], [address] Phone: [phone]   Fax:  [phone], [phone] PM   Re-cert done by: Janann August, PT, DPT 05/12/22 12:03 PM

## 2022-05-12 NOTE — Addendum Note (Signed)
Addended by: Arliss Journey on: 05/12/2022 12:10 PM   Modules accepted: Orders

## 2022-05-16 ENCOUNTER — Ambulatory Visit: Payer: 59 | Admitting: Rehabilitation

## 2022-05-16 DIAGNOSIS — R2689 Other abnormalities of gait and mobility: Secondary | ICD-10-CM

## 2022-05-16 DIAGNOSIS — I69351 Hemiplegia and hemiparesis following cerebral infarction affecting right dominant side: Secondary | ICD-10-CM

## 2022-05-16 DIAGNOSIS — M6281 Muscle weakness (generalized): Secondary | ICD-10-CM

## 2022-05-16 DIAGNOSIS — G8111 Spastic hemiplegia affecting right dominant side: Secondary | ICD-10-CM

## 2022-05-16 DIAGNOSIS — R2681 Unsteadiness on feet: Secondary | ICD-10-CM

## 2022-05-16 NOTE — Therapy (Signed)
OUTPATIENT PHYSICAL THERAPY TREATMENT NOTE   Patient Name: Felicia Chen MRN: [id number] DOB:06/07/1965, 57 y.o., female Today's Date: 05/16/2022  PCP: Aretta Nip, MD REFERRING PROVIDER: Courtney Heys, MD  END OF SESSION:      PT End of Session - 05/16/22 0817     Visit Number 28    Number of Visits 35   per re-cert on [id number]   Date for PT Re-Evaluation 07/11/22    Authorization Type UHC,    PT Start Time 1019    PT Stop Time 1100    PT Time Calculation (min) 41 min    Equipment Utilized During Treatment --   Floatation devices as needed for safety.   Activity Tolerance Patient tolerated treatment well    Behavior During Therapy [facility] for tasks assessed/performed               Past Medical History:  Diagnosis Date   Anxiety    Gilbert's syndrome    Past Surgical History:  Procedure Laterality Date   ABDOMINAL HYSTERECTOMY N/A 06/11/2012   Procedure: HYSTERECTOMY ABDOMINAL;  Surgeon: Allena Katz, MD;  Location: [facility];  Service: Gynecology;  Laterality: N/A;   CESAREAN SECTION     x 4   CYSTOSCOPY N/A 06/11/2012   Procedure: CYSTOSCOPY;  Surgeon: Allena Katz, MD;  Location: [facility];  Service: Gynecology;  Laterality: N/A;   HERNIA REPAIR     LAPAROSCOPIC ASSISTED VAGINAL HYSTERECTOMY N/A 06/11/2012   Procedure: LAPAROSCOPIC ASSISTED VAGINAL HYSTERECTOMY atttempted;  Surgeon: Allena Katz, MD;  Location: [facility];  Service: Gynecology;  Laterality: N/A;  Attempted Laparoscopic assisted vaginal hysterectomy.   LAPAROSCOPIC LYSIS OF ADHESIONS N/A 06/11/2012   Procedure: LAPAROSCOPIC LYSIS OF ADHESIONS;  Surgeon: Allena Katz, MD;  Location: [facility];  Service: Gynecology;  Laterality: N/A;   SALPINGOOPHORECTOMY Bilateral 06/11/2012   Procedure: SALPINGO OOPHORECTOMY;  Surgeon: Allena Katz, MD;  Location: [facility];  Service: Gynecology;  Laterality: Bilateral;   Patient Active Problem List   Diagnosis Date Noted   Spasticity as  late effect of cerebrovascular accident (CVA) 10/21/2021   Abnormal gait due to muscle weakness 10/21/2021   Nausea without vomiting    Spastic hemiparesis (HCC)    History of hypertension    Neurogenic orthostatic hypotension (HCC)    Transaminitis    Right hemiplegia (HCC)    Hemorrhagic stroke (HCC)    Leucocytosis    Essential hypertension    Dyslipidemia    Right hemiparesis (HCC)    ICH (intracerebral hemorrhage) ([place]) 12/01/2020   HYPERLIPIDEMIA 07/06/2009   OBESITY 07/06/2009    REFERRING DIAG: I61.2 (ICD-10-CM) - Nontraumatic hemorrhage of left cerebral hemisphere (HCC) I69.398,R25.2 (ICD-10-CM) - Spasticity as late effect of cerebrovascular accident (CVA) M62.81,R26.9 (ICD-10-  THERAPY DIAG:  Muscle weakness (generalized)  Hemiplegia and hemiparesis following cerebral infarction affecting right dominant side (HCC)  Other abnormalities of gait and mobility  Unsteadiness on feet  Right spastic hemiparesis (HCC)  Rationale for Evaluation and Treatment Rehabilitation  PERTINENT HISTORY: hx of R hemiplegia due to stroke in 10/22- secondary to L frontal ICH and small SAH, anxiety  PRECAUTIONS: fall, R hemi  SUBJECTIVE: Reports she begins OT treatment in March.  Reports doing well with elliptical.  Questions about strapping foot with theraband.  Feel that she can do this with assist from family.   PAIN:  Are you having pain? No   TODAY'S TREATMENT:   Patient seen for aquatic therapy  today.  Treatment took place in water 3.6-4.0 feet deep depending upon activity.  Pt entered and exited the pool via stairs using B rails in alternating fashion.  Pool temp at 92 deg.   Stretching towards end of session: calf stretch with B heels hanging off bottom step with single UE support x 2 sets of 30 secs.   In approx 4' dept, had pt ambulate approx 18' x 2 laps forwards without support, backwards x 2 laps (same distance),  side stepping with squat and UE abd/add with small  dumbbells.  Ended walking in pool with marching forward with alt UE flex with small dumbbells x4 laps.  No assisted needed today and she did very well coordinating motions.  Min cues to avoid squatting on single limb prior to marching.      NMR:  Standing near side of pool: kept RLE in stance moving LLE through hip flex/ext with knee ext x 10 reps, holding yellow noodle for support.  LLE in stance, moving R hip into flex/ext this time with knee flexed to 90 deg x 10 reps with support of noodle.  R then LLE circles moving clockwise x 10 reps then counter clockwise x 10 reps holding yellow noodle.  All performed with light to no UE support.  Standing at wall for support, performed jumping jacks (without UEs) x 20 reps.  Pt initially having marked difficulty even moving RLE, but this improved markedly during task.    Had pt sit on 3rd step, feet on first step, performing sit<>stands x 10 reps without UE support.  Facing rail with RUE support only lowering LLE to floor and back up for RLE eccentric control x 10 reps.  RLE placed on first step with RUE support on rail stepping up and bringing LLE into march.  Pt doing much better with RLE stability but needing min cues for improved forward weight shift at times.      Pt requires buoyancy of water for support for reduced fall risk and for unloading/reduced stress on joints (Rt ankle) as pt able to tolerate increased standing (esp R SLS)  and ambulation in water compared to that on land; viscosity of water is needed for resistance for strengthening and current of water provides perturbations for challenge for balance training      PATIENT EDUCATION: Education details: Continue HEP  Person educated: Patient and pt's daughter Education method: Explanation, Demonstration  Education comprehension: verbalized/demonstrated understanding     HOME EXERCISE PROGRAM: [id number]  Tall kneeling mini squats with bias towards R side Quadruped hip extensions with leg  straight and attempting with knee bent        GOALS: Goals reviewed with patient? Yes   UPDATED GOAL DATES FOR RE-CERT: **Unable to assess STGs/LTGs due to pt being in the pool, updated/revised goal dates for re-cert   SHORT TERM GOALS: ALL STGS = 06/09/2022    Pt will initiate aquatic therapy.  Baseline: Goal status: MET  2.  Pt will improve TUG time to 23 seconds with no AD in order to demo decrease fall risk. Baseline: with no AD: 28.78 seconds, Goal status: NEW  3.  Pt will improve gait speed with LRAD to at least 1.7 ft/sec in order to demo improved community mobility/decr fall risk Baseline: Gait speed with SPC: 21.47 seconds = 1.52 ft/sec Goal status: NEW   LONG TERM GOALS: ALL LTGS = 07/07/2022    Pt will be independent with final HEP for land/aquatic therapy in order to build  upon functional gains made in therapy. Baseline:  Goal status: ONGOING   2.  Pt will improve TUG time to 20 seconds with no AD in order to demo decrease fall risk. Baseline: with no AD: 28.78 seconds, Goal status: REVISED   3.  Pt will improve gait speed with LRAD vs. No AD to at least 2.0 ft/sec in order to demo improved community mobility. Baseline: Gait speed with SPC: 21.47 seconds = 1.52 ft/sec Goal status: ONGOING   4.  Pt will ambulate at least 500' over unlevel grass surfaces with no AD vs. LRAD with supervision in order to demo improved community mobility over unlevel surfaces  Baseline:  Goal status: NEW    5.  Pt will improve SLS on RLE to at least 2-3 seconds in order to demo improved SLS.  Baseline: <1 second   Goal status: NEW    ASSESSMENT:   CLINICAL IMPRESSION: Skilled session in pool today to utilize buoyancy of water for safety from falling and viscosity for resistance for strengthening and balance.  Emphasized RLE strengthening both in open and closed chain.  Pt continues to tolerate spending more time in SLS and relying less on UE support for tasks.      OBJECTIVE IMPAIRMENTS Abnormal gait, decreased activity tolerance, decreased balance, decreased coordination, decreased endurance, decreased mobility, difficulty walking, decreased ROM, decreased strength, impaired flexibility, impaired sensation, impaired tone, and postural dysfunction.    ACTIVITY LIMITATIONS carrying, lifting, bending, and locomotion level   PARTICIPATION LIMITATIONS: driving, community activity, and yard work   PERSONAL FACTORS Behavior pattern, Past/current experiences, Time since onset of injury/illness/exacerbation, and 1-2 comorbidities: anxiety  are also affecting patient's functional outcome.    REHAB POTENTIAL: Good   CLINICAL DECISION MAKING: Stable/uncomplicated   EVALUATION COMPLEXITY: Low   PLAN: PT FREQUENCY: 1-2x/week   PT DURATION: 8 weeks   PLANNED INTERVENTIONS: Therapeutic exercises, Therapeutic activity, Neuromuscular re-education, Balance training, Gait training, Patient/Family education, Self Care, Joint mobilization, Stair training, Vestibular training, Orthotic/Fit training, DME instructions, Aquatic Therapy, Electrical stimulation, Manual therapy, and Re-evaluation   PLAN FOR NEXT SESSION:  Work on RLE strengthening, balance, weight shifting to R. Gait training and improving quality of gait, try Elliptical again. Get pt in for aquatics. Add balance to HEP as needed. Check STGs next time pt has land therapy   Cameron Sprang, PT, MPT [facility] [address] [place], [place], [address] Phone: [phone]   Fax:  [phone] 05/16/22, 2:01 PM

## 2022-05-23 ENCOUNTER — Encounter: Payer: Self-pay | Admitting: Rehabilitation

## 2022-05-23 ENCOUNTER — Ambulatory Visit: Payer: 59 | Attending: Physical Medicine and Rehabilitation | Admitting: Rehabilitation

## 2022-05-23 DIAGNOSIS — G8111 Spastic hemiplegia affecting right dominant side: Secondary | ICD-10-CM | POA: Insufficient documentation

## 2022-05-23 DIAGNOSIS — M6281 Muscle weakness (generalized): Secondary | ICD-10-CM

## 2022-05-23 DIAGNOSIS — I69351 Hemiplegia and hemiparesis following cerebral infarction affecting right dominant side: Secondary | ICD-10-CM

## 2022-05-23 DIAGNOSIS — R2681 Unsteadiness on feet: Secondary | ICD-10-CM

## 2022-05-23 DIAGNOSIS — I69318 Other symptoms and signs involving cognitive functions following cerebral infarction: Secondary | ICD-10-CM | POA: Diagnosis present

## 2022-05-23 DIAGNOSIS — R278 Other lack of coordination: Secondary | ICD-10-CM | POA: Diagnosis present

## 2022-05-23 DIAGNOSIS — R41842 Visuospatial deficit: Secondary | ICD-10-CM | POA: Insufficient documentation

## 2022-05-23 DIAGNOSIS — R2689 Other abnormalities of gait and mobility: Secondary | ICD-10-CM | POA: Diagnosis present

## 2022-05-23 DIAGNOSIS — R29818 Other symptoms and signs involving the nervous system: Secondary | ICD-10-CM | POA: Diagnosis present

## 2022-05-23 NOTE — Therapy (Signed)
OUTPATIENT PHYSICAL THERAPY TREATMENT NOTE   Patient Name: Felicia Chen MRN: [id number] DOB:Jan 05, 1966, 57 y.o., female Today's Date: 05/23/2022  PCP: Aretta Nip, MD REFERRING PROVIDER: Courtney Heys, MD  END OF SESSION:      PT End of Session - 05/23/22 0830     Visit Number 29    Number of Visits 35   per re-cert on [id number]   Date for PT Re-Evaluation 07/11/22    Authorization Type [employer],    PT Start Time 1017    PT Stop Time 1100    PT Time Calculation (min) 43 min    Equipment Utilized During Treatment --   Floatation devices as needed for safety.   Activity Tolerance Patient tolerated treatment well    Behavior During Therapy [facility] for tasks assessed/performed               Past Medical History:  Diagnosis Date   Anxiety    Gilbert's syndrome    Past Surgical History:  Procedure Laterality Date   ABDOMINAL HYSTERECTOMY N/A 06/11/2012   Procedure: HYSTERECTOMY ABDOMINAL;  Surgeon: Allena Katz, MD;  Location: [facility];  Service: Gynecology;  Laterality: N/A;   CESAREAN SECTION     x 4   CYSTOSCOPY N/A 06/11/2012   Procedure: CYSTOSCOPY;  Surgeon: Allena Katz, MD;  Location: [facility];  Service: Gynecology;  Laterality: N/A;   HERNIA REPAIR     LAPAROSCOPIC ASSISTED VAGINAL HYSTERECTOMY N/A 06/11/2012   Procedure: LAPAROSCOPIC ASSISTED VAGINAL HYSTERECTOMY atttempted;  Surgeon: Allena Katz, MD;  Location: [facility];  Service: Gynecology;  Laterality: N/A;  Attempted Laparoscopic assisted vaginal hysterectomy.   LAPAROSCOPIC LYSIS OF ADHESIONS N/A 06/11/2012   Procedure: LAPAROSCOPIC LYSIS OF ADHESIONS;  Surgeon: Allena Katz, MD;  Location: [facility];  Service: Gynecology;  Laterality: N/A;   SALPINGOOPHORECTOMY Bilateral 06/11/2012   Procedure: SALPINGO OOPHORECTOMY;  Surgeon: Allena Katz, MD;  Location: [facility];  Service: Gynecology;  Laterality: Bilateral;   Patient Active Problem List   Diagnosis Date Noted   Spasticity as  late effect of cerebrovascular accident (CVA) 10/21/2021   Abnormal gait due to muscle weakness 10/21/2021   Nausea without vomiting    Spastic hemiparesis (HCC)    History of hypertension    Neurogenic orthostatic hypotension (HCC)    Transaminitis    Right hemiplegia (HCC)    Hemorrhagic stroke (HCC)    Leucocytosis    Essential hypertension    Dyslipidemia    Right hemiparesis (HCC)    ICH (intracerebral hemorrhage) ([place]) 12/01/2020   HYPERLIPIDEMIA 07/06/2009   OBESITY 07/06/2009    REFERRING DIAG: I61.2 (ICD-10-CM) - Nontraumatic hemorrhage of left cerebral hemisphere (HCC) I69.398,R25.2 (ICD-10-CM) - Spasticity as late effect of cerebrovascular accident (CVA) M62.81,R26.9 (ICD-10-  THERAPY DIAG:  Muscle weakness (generalized)  Hemiplegia and hemiparesis following cerebral infarction affecting right dominant side (HCC)  Other abnormalities of gait and mobility  Unsteadiness on feet  Rationale for Evaluation and Treatment Rehabilitation  PERTINENT HISTORY: hx of R hemiplegia due to stroke in 10/22- secondary to L frontal ICH and small SAH, anxiety  PRECAUTIONS: fall, R hemi  SUBJECTIVE: Reports she begins OT treatment in March.  Reports doing well with elliptical.  Questions about strapping foot with theraband.  Feel that she can do this with assist from family.   PAIN:  Are you having pain? No   TODAY'S TREATMENT:   Patient seen for aquatic therapy today.  Treatment took place  in water 3.6-4.0 feet deep depending upon activity.  Pt entered and exited the pool via stairs using B rails in alternating fashion.  Pool temp at 92 deg.   In approx 4' dept, had pt ambulate approx 18' x 2 laps forwards without support, backwards x 2 laps (same distance),  side stepping with squat and UE abd/add with small dumbbells x 2 laps (min cues for ensuring RLE doesn't IR during squat).  Ended walking in pool with marching forward with alt UE flex with small dumbbells x2 laps.  Again,  no assisted needed today and she did very well coordinating motions.  Min cues to avoid squatting on single limb prior to marching.   Side stepping with 2.5 lbs donned on each ankle without UE support for LE strengthening x 2 laps, braiding x 2 laps with support of noodle initially fading to no support x 3 laps total.     NMR:  Standing near side of pool: kept RLE in stance moving LLE through hip flex/ext with knee ext x 10 reps, holding yellow noodle for support.  LLE in stance, moving R hip into flex/ext this time with knee flexed to 90 deg x 10 reps with support of noodle.  Performed these with 2.5 lb ankle weights donned for strengthening.  All performed with light to no UE support.  Standing at wall for support but not needed today, performed jumping jacks (without UEs) x 20 reps.  Then scissor jumps x 20 reps without support initially but then with light support on wall (did modify and had her return to midline prior to switching feet).  Both jumping tasks done with 2.5lb ankle weight donned.  Standing at pool bench with 2.5lb ankle weights donned, tapping RLE to bench x 10 reps, LLE to bench x 10 reps, then hip/knee flex and hip abd to bench x 20 reps each side with cues for improved stance on RLE.    Forward jogging x 4 laps with cues for "pushing off toes" with min/guard to close S throughout.  Forward lunges x 4 laps with cues and demo for technique and correct R LE placement.     Pt requires buoyancy of water for support for reduced fall risk and for unloading/reduced stress on joints (Rt ankle) as pt able to tolerate increased standing (esp R SLS)  and ambulation in water compared to that on land; viscosity of water is needed for resistance for strengthening and current of water provides perturbations for challenge for balance training      PATIENT EDUCATION: Education details: Continue HEP  Person educated: Patient and pt's daughter Education method: Explanation, Demonstration  Education  comprehension: verbalized/demonstrated understanding     HOME EXERCISE PROGRAM: [id number]  Tall kneeling mini squats with bias towards R side Quadruped hip extensions with leg straight and attempting with knee bent        GOALS: Goals reviewed with patient? Yes   UPDATED GOAL DATES FOR RE-CERT: **Unable to assess STGs/LTGs due to pt being in the pool, updated/revised goal dates for re-cert   SHORT TERM GOALS: ALL STGS = 06/09/2022    Pt will initiate aquatic therapy.  Baseline: Goal status: MET  2.  Pt will improve TUG time to 23 seconds with no AD in order to demo decrease fall risk. Baseline: with no AD: 28.78 seconds, Goal status: NEW  3.  Pt will improve gait speed with LRAD to at least 1.7 ft/sec in order to demo improved community mobility/decr fall risk Baseline:  Gait speed with SPC: 21.47 seconds = 1.52 ft/sec Goal status: NEW   LONG TERM GOALS: ALL LTGS = 07/07/2022    Pt will be independent with final HEP for land/aquatic therapy in order to build upon functional gains made in therapy. Baseline:  Goal status: ONGOING   2.  Pt will improve TUG time to 20 seconds with no AD in order to demo decrease fall risk. Baseline: with no AD: 28.78 seconds, Goal status: REVISED   3.  Pt will improve gait speed with LRAD vs. No AD to at least 2.0 ft/sec in order to demo improved community mobility. Baseline: Gait speed with SPC: 21.47 seconds = 1.52 ft/sec Goal status: ONGOING   4.  Pt will ambulate at least 500' over unlevel grass surfaces with no AD vs. LRAD with supervision in order to demo improved community mobility over unlevel surfaces  Baseline:  Goal status: NEW    5.  Pt will improve SLS on RLE to at least 2-3 seconds in order to demo improved SLS.  Baseline: <1 second   Goal status: NEW    ASSESSMENT:   CLINICAL IMPRESSION: Skilled session in pool today to utilize buoyancy of water for safety from falling and viscosity for resistance for strengthening  and balance.  Pt continues to demonstrate ability to complete R SLS activities with less support and even able to add resistance today.  Pts endurance also seems to be improving.  Discussed that we will have 2 more visits with PT printing HEP at last session.    OBJECTIVE IMPAIRMENTS Abnormal gait, decreased activity tolerance, decreased balance, decreased coordination, decreased endurance, decreased mobility, difficulty walking, decreased ROM, decreased strength, impaired flexibility, impaired sensation, impaired tone, and postural dysfunction.    ACTIVITY LIMITATIONS carrying, lifting, bending, and locomotion level   PARTICIPATION LIMITATIONS: driving, community activity, and yard work   PERSONAL FACTORS Behavior pattern, Past/current experiences, Time since onset of injury/illness/exacerbation, and 1-2 comorbidities: anxiety  are also affecting patient's functional outcome.    REHAB POTENTIAL: Good   CLINICAL DECISION MAKING: Stable/uncomplicated   EVALUATION COMPLEXITY: Low   PLAN: PT FREQUENCY: 1-2x/week   PT DURATION: 8 weeks   PLANNED INTERVENTIONS: Therapeutic exercises, Therapeutic activity, Neuromuscular re-education, Balance training, Gait training, Patient/Family education, Self Care, Joint mobilization, Stair training, Vestibular training, Orthotic/Fit training, DME instructions, Aquatic Therapy, Electrical stimulation, Manual therapy, and Re-evaluation   PLAN FOR NEXT SESSION:  Work on RLE strengthening, balance, weight shifting to R. Gait training and improving quality of gait, try Elliptical again. Get pt in for aquatics. Add balance to HEP as needed. Check STGs next time pt has land therapy   Cameron Sprang, PT, MPT [facility] [address] [place], [place], [address] Phone: [phone]   Fax:  [phone] 05/23/22, 2:25 PM

## 2022-05-24 ENCOUNTER — Encounter: Payer: Self-pay | Admitting: Physical Medicine and Rehabilitation

## 2022-05-24 ENCOUNTER — Encounter: Payer: 59 | Attending: Physical Medicine and Rehabilitation | Admitting: Physical Medicine and Rehabilitation

## 2022-05-24 VITALS — BP 132/85 | HR 98 | Ht 68.0 in | Wt 226.0 lb

## 2022-05-24 DIAGNOSIS — I69398 Other sequelae of cerebral infarction: Secondary | ICD-10-CM | POA: Diagnosis not present

## 2022-05-24 DIAGNOSIS — R252 Cramp and spasm: Secondary | ICD-10-CM | POA: Insufficient documentation

## 2022-05-24 DIAGNOSIS — I619 Nontraumatic intracerebral hemorrhage, unspecified: Secondary | ICD-10-CM | POA: Diagnosis not present

## 2022-05-24 DIAGNOSIS — G8110 Spastic hemiplegia affecting unspecified side: Secondary | ICD-10-CM | POA: Insufficient documentation

## 2022-05-24 DIAGNOSIS — G8191 Hemiplegia, unspecified affecting right dominant side: Secondary | ICD-10-CM | POA: Diagnosis not present

## 2022-05-24 DIAGNOSIS — M6281 Muscle weakness (generalized): Secondary | ICD-10-CM | POA: Insufficient documentation

## 2022-05-24 DIAGNOSIS — R269 Unspecified abnormalities of gait and mobility: Secondary | ICD-10-CM | POA: Insufficient documentation

## 2022-05-24 DIAGNOSIS — F411 Generalized anxiety disorder: Secondary | ICD-10-CM | POA: Insufficient documentation

## 2022-05-24 NOTE — Patient Instructions (Addendum)
Pt is a 57 yr old female with hx of R hemiplegia due to stroke in 10/22- secondary to L frontal ICH and small SAH- with associated spasticity and post STROKE hypotension- was on Florinef and Midodrine in hospital. Off Florinef and prn midodrine.  Here for f/u on spasticity and stroke.     1. 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 To work Radio producer.    2. WEAR FOOT UP brace whenever walking!!! Will reduce fatigue from how much energy spending walking.   3. Should not wear backless shoes until I say otherwise.    4. Doesn't need traditional AFO- but keep just in case in future.    5. Can sit on exercise ball- knees at 90 degrees- do instead of sitting at dining chair, use ball for chair; use while watching instead of couch-  Goal 30-60 minutes/day most days- after you are comfortable, can start lifting one leg at a time to work on more balance. .    6. Dancing is good- just have a counter nearby to catch yourself.    7. Remember, needs to work on having fun not just 'getting better'   8. Baclofen makes her dizzy- but take as needed if having muscle spasms, will take at night. Can take 1/2 tab  9. On Trintellix 20 mg nightly- for mood. Continue per PCP.    10.  Have weaned Gabapentin to 200 mg nightly, but increased for mother in law passing to 300 mg QHS again. Trying to get off.   11. Has gone to therapy for anxiety- goes q2-4 weeks for this. Agree with this.   12. Work on Advertising copywriter some daily to work on Administrator, Civil Service.   13. F/U in 3 months-   14. Acupuncture usually doesn't help stroke Sx's- and stem cells don't use until approved in [place]  15. If going to do massage- suggest asking for medical massage. Myofascial muscle pain more than anything.

## 2022-05-24 NOTE — Progress Notes (Signed)
Subjective:    Patient ID: Felicia Chen, female    DOB: [birth year], 57 y.o.   MRN: [id number]  HPI  Pt is a 57 yr old female with hx of R hemiplegia due to stroke in 10/22- secondary to L frontal ICH and small SAH- with associated spasticity and post STROKE hypotension- was on Florinef and Midodrine in hospital. Off Florinef and prn midodrine.  Here for f/u on spasticity and stroke.     At 3 months, didn't feel like improved enough, so cancelled appointment.  Also mother in law was in hospital at Thanksgiving, so had to cancel appointment as well. But she actually ending up passing away and planned entire funeral.     Is "all right"- is trying- baby steps- still making improvements.    In tune with body and what's going on.   Able to do a lot of new things. But upset when cannot remember something- even though some of this is just normal.   Can now feel bottom of both feet- because walking without shoes- and feel toes flexing to "hold on".   Thinks stamina is much bette-r abl to stay up and out of house a lot longer.    Family has backed off helping some now.   Always wakes up with anxiety-  Trying to accomplish more.   Starting yoga  Doing PT and OT again-in pool for PT and going to for OT after finishes for PT.  Started OT because still weak on RUE and shoulders/back "issues".  Going to put her in pool Still weak on RLE as well.    Doesn't regularly drive- daughter Felicia Chen usually drives.   Daughter agrees pt hyperfocused on trying to get better; some days can relax more.   Got ellipitcal- using it- every day for 6 minutes also has treadmill but has poor gait and PT doesn't want her to reinforce "bad gait"- and other doctor said just keep walking- so getting different opinions.    A lot of her issues are fear of falling and anxiety Worried about handwriting- thinks it's a little shaky.    Toes curling some on R foot.   Takes vistaril rarely and has Xanax  for anxiety- 1-2/day- takes rarely- anxiety is more situational.   Takes Ambien to sleep  Social Hx: Dog in palliative care- having bowel and bladder incontinence Sense of accomplishment can take care of self more.  Son moving to [place] for wife going to medical school-  Oldest daughter getting doctorate- making plans for graduation.   Pain Inventory Average Pain 1 Pain Right Now 1 My pain is intermittent and aching  LOCATION OF PAIN  knee, hip  BOWEL Number of stools per week: 7 Oral laxative use Yes    BLADDER Normal    Mobility use a cane how many minutes can you walk? 60 ability to climb steps?  yes  Function employed # of hrs/week not currently working Do you have any goals in this area?  yes  Neuro/Psych weakness trouble walking dizziness anxiety  Prior Studies Any changes since last visit?  no  Physicians involved in your care Any changes since last visit?  no   History reviewed. No pertinent family history. Social History   Socioeconomic History   Marital status: Married    Spouse name: Not on file   Number of children: 5   Years of education: Not on file   Highest education level: Not on file  Occupational History   Not  on file  Tobacco Use   Smoking status: Never   Smokeless tobacco: Never  Vaping Use   Vaping Use: Never used  Substance and Sexual Activity   Alcohol use: Not Currently    Comment: rarely   Drug use: No   Sexual activity: Yes    Partners: Male  Other Topics Concern   Not on file  Social History Narrative   Not on file   Social Determinants of Health   Financial Resource Strain: Not on file  Food Insecurity: Not on file  Transportation Needs: Not on file  Physical Activity: Not on file  Stress: Not on file  Social Connections: Not on file   Past Surgical History:  Procedure Laterality Date   ABDOMINAL HYSTERECTOMY N/A 06/11/2012   Procedure: HYSTERECTOMY ABDOMINAL;  Surgeon: Allena Katz, MD;  Location:  [facility];  Service: Gynecology;  Laterality: N/A;   CESAREAN SECTION     x 4   CYSTOSCOPY N/A 06/11/2012   Procedure: CYSTOSCOPY;  Surgeon: Allena Katz, MD;  Location: [facility];  Service: Gynecology;  Laterality: N/A;   HERNIA REPAIR     LAPAROSCOPIC ASSISTED VAGINAL HYSTERECTOMY N/A 06/11/2012   Procedure: LAPAROSCOPIC ASSISTED VAGINAL HYSTERECTOMY atttempted;  Surgeon: Allena Katz, MD;  Location: [facility];  Service: Gynecology;  Laterality: N/A;  Attempted Laparoscopic assisted vaginal hysterectomy.   LAPAROSCOPIC LYSIS OF ADHESIONS N/A 06/11/2012   Procedure: LAPAROSCOPIC LYSIS OF ADHESIONS;  Surgeon: Allena Katz, MD;  Location: [facility];  Service: Gynecology;  Laterality: N/A;   SALPINGOOPHORECTOMY Bilateral 06/11/2012   Procedure: SALPINGO OOPHORECTOMY;  Surgeon: Allena Katz, MD;  Location: [facility];  Service: Gynecology;  Laterality: Bilateral;   Past Medical History:  Diagnosis Date   Anxiety    Gilbert's syndrome    Ht '5\' 8"'$  (1.727 m)   Wt 226 lb (102.5 kg)   BMI 34.36 kg/m   Opioid Risk Score:   Fall Risk Score:  `1  Depression screen [facility] 2/9     05/24/2022   10:10 AM 10/21/2021    9:26 AM 04/29/2021   11:49 AM 01/24/2021   12:03 PM  Depression screen PHQ 2/9  Decreased Interest 0 0 3 0  Down, Depressed, Hopeless 0 0 3 2  PHQ - 2 Score 0 0 6 2  Altered sleeping    0  Tired, decreased energy    3  Change in appetite    3  Feeling bad or failure about yourself     0  Trouble concentrating    0  Moving slowly or fidgety/restless    0  Suicidal thoughts    0  PHQ-9 Score    8    Review of Systems  Neurological:  Positive for dizziness and weakness.  Psychiatric/Behavioral:         Anxiety  All other systems reviewed and are negative.      Objective:   Physical Exam  Awake, alert, tearful at times; accompanied by daughter Felicia Chen, NAD Using quad cane to get around Pressured speech - not real signs of aphasia anymore  LUE 5/5 RUE- 5/5 except FA  4+/5 LLE_ 5/5 RLE- HF 4/5; KE 4+/5; KF 4/5; DF 4+/5 and PF 4+/5  Trigger point- bad one- between 1st/2nd toes- between rays Neuro: Hoffman's barely noticeable on RUE 3-4 beats clonus in RLE MAS of 1 in RLE  Gait: Circumducts RLE Also has R hip hike due to trying to clear R foot/toes  Mainly step to gait with Left foot first, but CAN do step over step        Assessment & Plan:   Pt is a 57 yr old female with hx of R hemiplegia due to stroke in 10/22- secondary to L frontal ICH and small SAH- with associated spasticity and post STROKE hypotension- was on Florinef and Midodrine in hospital. Off Florinef and prn midodrine.  Here for f/u on spasticity and stroke.     1. 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 To work Radio producer.    2. WEAR FOOT UP brace whenever walking!!! Will reduce fatigue from how much energy spending walking.   3. Should not wear backless shoes until I say otherwise.    4. Doesn't need traditional AFO- but keep just in case in future.    5. Can sit on exercise ball- knees at 90 degrees- do instead of sitting at dining chair, use ball for chair; use while watching instead of couch-  Goal 30-60 minutes/day most days- after you are comfortable, can start lifting one leg at a time to work on more balance. .    6. Dancing is good- just have a counter nearby to catch yourself.    7. Remember, needs to work on having fun not just 'getting better'   8. Baclofen makes her dizzy- but take as needed if having muscle spasms, will take at night. Can take 1/2 tab  9. On Trintellix 20 mg nightly- for mood. Continue per PCP.    10.  Have weaned Gabapentin to 200 mg nightly, but increased for mother in law passing to 300 mg QHS again. Trying to get off.   11. Has gone to therapy for anxiety- goes q2-4 weeks for this. Agree with this.   12. Work on Advertising copywriter some daily to work on Administrator, Civil Service.   13. F/U in 3 months-   14.  Acupuncture usually doesn't help stroke Sx's- and stem cells don't use until approved in [place] 15. If going to do massage- suggest asking for medical massage. Myofascial muscle pain more than anything.   I spent a total of  42  minutes on total care today- >50% coordination of care- due to discussion about living, foot up brace; working on balance and R foot strengthening.

## 2022-05-25 ENCOUNTER — Ambulatory Visit: Payer: 59 | Admitting: Occupational Therapy

## 2022-05-30 ENCOUNTER — Encounter: Payer: Self-pay | Admitting: Rehabilitation

## 2022-05-30 ENCOUNTER — Ambulatory Visit: Payer: 59 | Admitting: Rehabilitation

## 2022-05-30 DIAGNOSIS — R2689 Other abnormalities of gait and mobility: Secondary | ICD-10-CM

## 2022-05-30 DIAGNOSIS — I69351 Hemiplegia and hemiparesis following cerebral infarction affecting right dominant side: Secondary | ICD-10-CM

## 2022-05-30 DIAGNOSIS — M6281 Muscle weakness (generalized): Secondary | ICD-10-CM | POA: Diagnosis not present

## 2022-05-30 DIAGNOSIS — R2681 Unsteadiness on feet: Secondary | ICD-10-CM

## 2022-05-30 NOTE — Therapy (Signed)
OUTPATIENT PHYSICAL THERAPY TREATMENT NOTE   Patient Name: Felicia Chen MRN: [id number] DOB:03/29/1965, 57 y.o., female Today's Date: 05/30/2022  PCP: Aretta Nip, MD REFERRING PROVIDER: Courtney Heys, MD  END OF SESSION:      PT End of Session - 05/30/22 0829     Visit Number 30    Number of Visits 35   per re-cert on [id number]   Date for PT Re-Evaluation 07/11/22    Authorization Type UHC,    PT Start Time 1015    PT Stop Time 1100    PT Time Calculation (min) 45 min    Equipment Utilized During Treatment --   Floatation devices as needed for safety.   Activity Tolerance Patient tolerated treatment well    Behavior During Therapy [facility] for tasks assessed/performed               Past Medical History:  Diagnosis Date   Anxiety    Gilbert's syndrome    Past Surgical History:  Procedure Laterality Date   ABDOMINAL HYSTERECTOMY N/A 06/11/2012   Procedure: HYSTERECTOMY ABDOMINAL;  Surgeon: Allena Katz, MD;  Location: [facility];  Service: Gynecology;  Laterality: N/A;   CESAREAN SECTION     x 4   CYSTOSCOPY N/A 06/11/2012   Procedure: CYSTOSCOPY;  Surgeon: Allena Katz, MD;  Location: [facility];  Service: Gynecology;  Laterality: N/A;   HERNIA REPAIR     LAPAROSCOPIC ASSISTED VAGINAL HYSTERECTOMY N/A 06/11/2012   Procedure: LAPAROSCOPIC ASSISTED VAGINAL HYSTERECTOMY atttempted;  Surgeon: Allena Katz, MD;  Location: [facility];  Service: Gynecology;  Laterality: N/A;  Attempted Laparoscopic assisted vaginal hysterectomy.   LAPAROSCOPIC LYSIS OF ADHESIONS N/A 06/11/2012   Procedure: LAPAROSCOPIC LYSIS OF ADHESIONS;  Surgeon: Allena Katz, MD;  Location: [facility];  Service: Gynecology;  Laterality: N/A;   SALPINGOOPHORECTOMY Bilateral 06/11/2012   Procedure: SALPINGO OOPHORECTOMY;  Surgeon: Allena Katz, MD;  Location: [facility];  Service: Gynecology;  Laterality: Bilateral;   Patient Active Problem List   Diagnosis Date Noted   Generalized  anxiety disorder 05/24/2022   Spasticity as late effect of cerebrovascular accident (CVA) 10/21/2021   Abnormal gait due to muscle weakness 10/21/2021   Nausea without vomiting    Spastic hemiparesis (HCC)    History of hypertension    Neurogenic orthostatic hypotension (HCC)    Transaminitis    Right hemiplegia (HCC)    Hemorrhagic stroke (HCC)    Leucocytosis    Essential hypertension    Dyslipidemia    Right hemiparesis (HCC)    ICH (intracerebral hemorrhage) ([place]) 12/01/2020   HYPERLIPIDEMIA 07/06/2009   OBESITY 07/06/2009    REFERRING DIAG: I61.2 (ICD-10-CM) - Nontraumatic hemorrhage of left cerebral hemisphere (HCC) I69.398,R25.2 (ICD-10-CM) - Spasticity as late effect of cerebrovascular accident (CVA) M62.81,R26.9 (ICD-10-  THERAPY DIAG:  Muscle weakness (generalized)  Hemiplegia and hemiparesis following cerebral infarction affecting right dominant side (HCC)  Unsteadiness on feet  Other abnormalities of gait and mobility  Rationale for Evaluation and Treatment Rehabilitation  PERTINENT HISTORY: hx of R hemiplegia due to stroke in 10/22- secondary to L frontal ICH and small SAH, anxiety  PRECAUTIONS: fall, R hemi  SUBJECTIVE: She reports on how Dr. Wille Glaser visit went and was told to wear foot up brace at all times and to "live her life" rather than focus on getting better.  We did discuss foot up brace and that if it does help it could definitely reduce fall risk but  also decrease how much energy she uses to clear foot which in turn could help reduce tone.  Pt verbalized understanding.   PAIN:  Are you having pain? No   TODAY'S TREATMENT:   Patient seen for aquatic therapy today.  Treatment took place in water 3.6-4.0 feet deep depending upon activity.  Pt entered and exited the pool via stairs using B rails in alternating fashion.  Pool temp at 92 deg.   In approx 4' dept, had pt ambulate approx 18' x 2 laps forwards without support, backwards x 2 laps (same  distance),  side stepping with squat and UE abd/add with small dumbbells x 2 laps (min cues for ensuring RLE doesn't IR during squat).  Ended walking in pool with marching forward with alt UE flex with small dumbbells x2 laps.  Min cues for ensuring midline squat.    NMR:  Standing near side of pool: kept RLE in stance moving LLE through hip flex/ext with knee ext x 10 reps, holding yellow noodle for support.  LLE in stance, moving R hip into flex/ext this time with knee flexed to 90 deg x 10 reps with support of noodle.  Pt needing less support from noodle today but still not quite ready to do without UE support.  In SLS performing LE circles with opposite LE x 10 reps clockwise and 10 reps counterclockwise.  Standing on blue pool noodle, with feet apart by wall for support as needed, maintaining balance x 2 sets of 20 secs>alt UE raises x 20 reps.  Min cues for improved hip strategy and foot placement to avoid heels touching pool floor.   Ended with having pt stand without support and tap RLE to target called out by PT (utilized clock-which was in front of [place] for visual and would call out "2 o clock, etc) x 10 reps on each side.    Core/UE strengthening:  standing with feet together holding smaller barbells moving UEs from shoulder flex to ext x 10 reps slowly for core stability.  Progressed to moving arms in opposite directions (flex/ext) slowly x 10 reps then attempting to move faster for more trunk control.  Note that it was difficult to move barbells fast so removed and had her keep hands flat and move arms in opposite directions.  Performed 2 sets of 15 secs with cues for improved core activation and to keep knees "soft."  Plank position on noodle with PT providing mod A initially to get into correct position, moving noodle into flex/ext x 10 reps with PT assisting lightly to maintain foot position and to assist RUE.    Forward resisted jogging x 2 laps with cues for "pushing off toes" with min/guard to  close S throughout.  Forward resisted walking with PT providing random/varied external perturbations for stepping strategy.  Pt able to self recover.     Pt requires buoyancy of water for support for reduced fall risk and for unloading/reduced stress on joints (Rt ankle) as pt able to tolerate increased standing (esp R SLS)  and ambulation in water compared to that on land; viscosity of water is needed for resistance for strengthening and current of water provides perturbations for challenge for balance training      PATIENT EDUCATION: Education details: Continue HEP  Person educated: Patient and pt's daughter Education method: Explanation, Demonstration  Education comprehension: verbalized/demonstrated understanding     HOME EXERCISE PROGRAM: [id number]  Tall kneeling mini squats with bias towards R side Quadruped hip extensions with leg  straight and attempting with knee bent        GOALS: Goals reviewed with patient? Yes   UPDATED GOAL DATES FOR RE-CERT: **Unable to assess STGs/LTGs due to pt being in the pool, updated/revised goal dates for re-cert   SHORT TERM GOALS: ALL STGS = 06/09/2022    Pt will initiate aquatic therapy.  Baseline: Goal status: MET  2.  Pt will improve TUG time to 23 seconds with no AD in order to demo decrease fall risk. Baseline: with no AD: 28.78 seconds, Goal status: NEW  3.  Pt will improve gait speed with LRAD to at least 1.7 ft/sec in order to demo improved community mobility/decr fall risk Baseline: Gait speed with SPC: 21.47 seconds = 1.52 ft/sec Goal status: NEW   LONG TERM GOALS: ALL LTGS = 07/07/2022    Pt will be independent with final HEP for land/aquatic therapy in order to build upon functional gains made in therapy. Baseline:  Goal status: ONGOING   2.  Pt will improve TUG time to 20 seconds with no AD in order to demo decrease fall risk. Baseline: with no AD: 28.78 seconds, Goal status: REVISED   3.  Pt will improve gait  speed with LRAD vs. No AD to at least 2.0 ft/sec in order to demo improved community mobility. Baseline: Gait speed with SPC: 21.47 seconds = 1.52 ft/sec Goal status: ONGOING   4.  Pt will ambulate at least 500' over unlevel grass surfaces with no AD vs. LRAD with supervision in order to demo improved community mobility over unlevel surfaces  Baseline:  Goal status: NEW    5.  Pt will improve SLS on RLE to at least 2-3 seconds in order to demo improved SLS.  Baseline: <1 second   Goal status: NEW    ASSESSMENT:   CLINICAL IMPRESSION: Skilled session in pool today to utilize buoyancy of water for safety from falling and viscosity for resistance for strengthening and balance.  Pt continues to demonstrate improved balance with needing less support overall, ability to maintain R SLS and increase challenge of tasks.   Will plan to see her next week for last visit and will provide handout for HEP.    OBJECTIVE IMPAIRMENTS Abnormal gait, decreased activity tolerance, decreased balance, decreased coordination, decreased endurance, decreased mobility, difficulty walking, decreased ROM, decreased strength, impaired flexibility, impaired sensation, impaired tone, and postural dysfunction.    ACTIVITY LIMITATIONS carrying, lifting, bending, and locomotion level   PARTICIPATION LIMITATIONS: driving, community activity, and yard work   PERSONAL FACTORS Behavior pattern, Past/current experiences, Time since onset of injury/illness/exacerbation, and 1-2 comorbidities: anxiety  are also affecting patient's functional outcome.    REHAB POTENTIAL: Good   CLINICAL DECISION MAKING: Stable/uncomplicated   EVALUATION COMPLEXITY: Low   PLAN: PT FREQUENCY: 1-2x/week   PT DURATION: 8 weeks   PLANNED INTERVENTIONS: Therapeutic exercises, Therapeutic activity, Neuromuscular re-education, Balance training, Gait training, Patient/Family education, Self Care, Joint mobilization, Stair training, Vestibular  training, Orthotic/Fit training, DME instructions, Aquatic Therapy, Electrical stimulation, Manual therapy, and Re-evaluation   PLAN FOR NEXT SESSION:  Remind her to schedule week of April with Chloe. Work on RLE strengthening, balance, weight shifting to R. Gait training and improving quality of gait, try Elliptical again. Get pt in for aquatics. Add balance to HEP as needed. Check STGs next time pt has land therapy   Cameron Sprang, PT, MPT [facility] [address] [place], [place], [address] Phone: [phone]   Fax:  [phone] 05/30/22, 12:02 PM

## 2022-06-02 ENCOUNTER — Encounter: Payer: Self-pay | Admitting: Occupational Therapy

## 2022-06-02 ENCOUNTER — Ambulatory Visit: Payer: 59 | Admitting: Occupational Therapy

## 2022-06-02 DIAGNOSIS — R278 Other lack of coordination: Secondary | ICD-10-CM

## 2022-06-02 DIAGNOSIS — M6281 Muscle weakness (generalized): Secondary | ICD-10-CM

## 2022-06-02 DIAGNOSIS — R29818 Other symptoms and signs involving the nervous system: Secondary | ICD-10-CM

## 2022-06-02 DIAGNOSIS — R41842 Visuospatial deficit: Secondary | ICD-10-CM

## 2022-06-02 DIAGNOSIS — I69318 Other symptoms and signs involving cognitive functions following cerebral infarction: Secondary | ICD-10-CM

## 2022-06-02 DIAGNOSIS — G8111 Spastic hemiplegia affecting right dominant side: Secondary | ICD-10-CM

## 2022-06-02 DIAGNOSIS — I69351 Hemiplegia and hemiparesis following cerebral infarction affecting right dominant side: Secondary | ICD-10-CM

## 2022-06-02 NOTE — Therapy (Signed)
OUTPATIENT OCCUPATIONAL THERAPY NEURO TREATMENT  Patient Name: Felicia BRAZELTON MRN: [id number] DOB:Nov 23, 1965, 57 y.o., female Today's Date: 06/02/2022  PCP: Aretta Nip, MD REFERRING PROVIDER: Courtney Heys, MD  END OF SESSION:  OT End of Session - 06/02/22 1047     Visit Number 2    Number of Visits 7    Date for OT Re-Evaluation 06/30/22    Authorization Type UHC - VL 40    OT Start Time 1022    OT Stop Time 1101    OT Time Calculation (min) 39 min    Activity Tolerance Patient tolerated treatment well    Behavior During Therapy WFL for tasks assessed/performed              Past Medical History:  Diagnosis Date   Anxiety    Gilbert's syndrome    Past Surgical History:  Procedure Laterality Date   ABDOMINAL HYSTERECTOMY N/A 06/11/2012   Procedure: HYSTERECTOMY ABDOMINAL;  Surgeon: Allena Katz, MD;  Location: [facility];  Service: Gynecology;  Laterality: N/A;   CESAREAN SECTION     x 4   CYSTOSCOPY N/A 06/11/2012   Procedure: CYSTOSCOPY;  Surgeon: Allena Katz, MD;  Location: [facility];  Service: Gynecology;  Laterality: N/A;   HERNIA REPAIR     LAPAROSCOPIC ASSISTED VAGINAL HYSTERECTOMY N/A 06/11/2012   Procedure: LAPAROSCOPIC ASSISTED VAGINAL HYSTERECTOMY atttempted;  Surgeon: Allena Katz, MD;  Location: [facility];  Service: Gynecology;  Laterality: N/A;  Attempted Laparoscopic assisted vaginal hysterectomy.   LAPAROSCOPIC LYSIS OF ADHESIONS N/A 06/11/2012   Procedure: LAPAROSCOPIC LYSIS OF ADHESIONS;  Surgeon: Allena Katz, MD;  Location: [facility];  Service: Gynecology;  Laterality: N/A;   SALPINGOOPHORECTOMY Bilateral 06/11/2012   Procedure: SALPINGO OOPHORECTOMY;  Surgeon: Allena Katz, MD;  Location: [facility];  Service: Gynecology;  Laterality: Bilateral;   Patient Active Problem List   Diagnosis Date Noted   Generalized anxiety disorder 05/24/2022   Spasticity as late effect of cerebrovascular accident (CVA) 10/21/2021    Abnormal gait due to muscle weakness 10/21/2021   Nausea without vomiting    Spastic hemiparesis (HCC)    History of hypertension    Neurogenic orthostatic hypotension (HCC)    Transaminitis    Right hemiplegia (HCC)    Hemorrhagic stroke (HCC)    Leucocytosis    Essential hypertension    Dyslipidemia    Right hemiparesis (HCC)    ICH (intracerebral hemorrhage) ([place]) 12/01/2020   HYPERLIPIDEMIA 07/06/2009   OBESITY 07/06/2009    ONSET DATE: 12/01/2020  REFERRING DIAG: G81.11 (ICD-10-CM) - Right spastic hemiparesis  THERAPY DIAG:  Muscle weakness (generalized)  Hemiplegia and hemiparesis following cerebral infarction affecting right dominant side (HCC)  Right spastic hemiparesis (HCC)  Other lack of coordination  Visuospatial deficit  Other symptoms and signs involving the nervous system  Other symptoms and signs involving cognitive functions following cerebral infarction  Rationale for Evaluation and Treatment: Rehabilitation  SUBJECTIVE:   SUBJECTIVE STATEMENT: Pt reports she feels she needs to strengthen R upper arm.  Pt accompanied by:  [name]  PERTINENT HISTORY: hx of R hemiplegia due to stroke in 10/22- secondary to L frontal ICH and small SAH, anxiety.    OT eval for RUE strengthening after stroke  PRECAUTIONS: Fall  WEIGHT BEARING RESTRICTIONS: No  PAIN:  Are you having pain? No  FALLS: Has patient fallen in last 6 months? Yes. 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; was driving  PATIENT GOALS: shoulder mobility, strength, use of RUE  OBJECTIVE:    HAND DOMINANCE: Right  ADLs: Overall ADLs: SBA Equipment: Shower seat with back, Walk in shower, bed side commode, and [place]  IADLs: Shopping: min A Light housekeeping: able to do dishes; requires assistance for vacuuming but has light vacuum that she has not yet tried to use Meal Prep: light meal prep Community mobility: dependent Medication management: mod I with pill boxes Financial management: I Handwriting: 100% legible improved since onset of stroke  MOBILITY STATUS: Needs Assist: SBA to CGA  with use of Hurrycane  ACTIVITY TOLERANCE: Activity tolerance: good  UPPER EXTREMITY ROM:    BUE AROM WFL  UPPER EXTREMITY MMT:     BUE - WFL with exception to R shoulder flexion 4-/5  HAND FUNCTION: Grip strength: Right: 57 lbs; Left: 64.5 lbs  COORDINATION: 9 Hole Peg test: Right: 45 sec; Left: 27 sec  FUNCTIONAL OUTCOME MEASURE: Upper Extremity Functional Index: 61/80    SENSATION: WFL  EDEMA: mild on R  MUSCLE TONE: RUE: Within functional limits  COGNITION: Overall cognitive status: History of cognitive impairments - at baseline  VISION: Subjective report: reports vision is back; denies double vision  PERCEPTION: Improved attention to R side  PRAXIS: WFL  OBSERVATIONS: Pt appears well-kept and has glasses and mask donned. Uses cane with altered gait though no LOB. Pt anxious and provides narrative answers to yew/no questions. Requires cues to remain on task/topic at times.    TODAY'S TREATMENT:                                                                                                                              - Therapeutic exercises completed for duration as noted below including:  OT initiated R red Thera-Band HEP including shoulder flexion, horizontal abd/add, bicep curl, and tricep extension to promote strengthening of affected extremity and overall endurance as noted in pt instructions.   - Self-care/home management completed for duration as  noted below including: Pt completed upper extremity functional index as noted above to determine areas of impairment with respect to RUE use.   PATIENT EDUCATION: Education details: R red theraband HEP Person educated: Patient and Child(ren) Education method: Explanation and Handouts Education comprehension: verbalized understanding, returned demonstration, and needs further education  HOME EXERCISE PROGRAM: 06/02/2022: R Red theraband    GOALS:  SHORT TERM GOALS: Target date: 06/02/2022   Patient will be independent with RUE theraband HEP. Baseline: no recall Goal status: INITIAL  2.  Pt will independently recall at least 2 tremor reduction strategies.  Baseline:  Goal status: INITIAL  LONG TERM GOALS: Target date: 06/30/2022  Patient will demonstrate updated RUE HEP with 25% verbal cues or less for proper execution. Baseline:  Goal status: INITIAL  2.  Patient will complete nine-hole peg with use of R in 35 seconds or less. Baseline: 45 seconds Goal status: INITIAL  3.  Pt will demonstrate ability to place 3 lb item on overhead shelf using RUE and good safety. Baseline: unable Goal status: INITIAL  4.  Pt will improve Upper Extremity Functional Index by at least 8 points. Baseline: 61/80 Goal status: INITIAL   ASSESSMENT:  CLINICAL IMPRESSION: Pt would benefit from skilled OT services in the outpatient setting to work on impairments as noted below to help pt return to PLOF as able.    PERFORMANCE DEFICITS: in functional skills including ADLs, IADLs, coordination, sensation, edema, tone, ROM, strength, pain, Fine motor control, Gross motor control, mobility, balance, endurance, and UE functional use.  IMPAIRMENTS: are limiting patient from ADLs, IADLs, work, leisure, and social participation.   CO-MORBIDITIES: may have co-morbidities  that affects occupational performance. Patient will benefit from skilled OT to address above impairments and improve overall  function.  REHAB POTENTIAL: Fair given chronicity of stroke  PLAN:  OT FREQUENCY: 1x/week  OT DURATION:  6 sessions  PLANNED INTERVENTIONS: self care/ADL training, therapeutic exercise, therapeutic activity, neuromuscular re-education, manual therapy, functional mobility training, ultrasound, paraffin, fluidotherapy, moist heat, contrast bath, patient/family education, visual/perceptual remediation/compensation, energy conservation, coping strategies training, DME and/or AE instructions, and Re-evaluation  RECOMMENDED OTHER SERVICES: none at this time  CONSULTED AND AGREED WITH PLAN OF CARE: Patient and family member/caregiver  PLAN FOR NEXT SESSION: Review theraband HEP; coordination HEP; tremor reduction strategies   Dennis Bast, OT 06/02/2022, 12:17 PM

## 2022-06-05 ENCOUNTER — Encounter: Payer: Self-pay | Admitting: Physical Therapy

## 2022-06-06 ENCOUNTER — Ambulatory Visit: Payer: 59 | Admitting: Rehabilitation

## 2022-06-06 ENCOUNTER — Encounter: Payer: Self-pay | Admitting: Rehabilitation

## 2022-06-06 DIAGNOSIS — R2689 Other abnormalities of gait and mobility: Secondary | ICD-10-CM

## 2022-06-06 DIAGNOSIS — I69351 Hemiplegia and hemiparesis following cerebral infarction affecting right dominant side: Secondary | ICD-10-CM

## 2022-06-06 DIAGNOSIS — R2681 Unsteadiness on feet: Secondary | ICD-10-CM

## 2022-06-06 DIAGNOSIS — M6281 Muscle weakness (generalized): Secondary | ICD-10-CM | POA: Diagnosis not present

## 2022-06-06 NOTE — Therapy (Signed)
OUTPATIENT PHYSICAL THERAPY TREATMENT NOTE   Patient Name: Felicia Chen MRN: [id number] DOB:06/17/1965, 57 y.o., female Today's Date: 06/06/2022  PCP: Aretta Nip, MD REFERRING PROVIDER: Courtney Heys, MD  END OF SESSION:      PT End of Session - 06/06/22 0823     Visit Number 31    Number of Visits 35   per re-cert on [id number]   Date for PT Re-Evaluation 07/11/22    Authorization Type UHC,    PT Start Time 1018    PT Stop Time 1100    PT Time Calculation (min) 42 min    Equipment Utilized During Treatment --   Floatation devices as needed for safety.   Activity Tolerance Patient tolerated treatment well    Behavior During Therapy [facility] for tasks assessed/performed               Past Medical History:  Diagnosis Date   Anxiety    Gilbert's syndrome    Past Surgical History:  Procedure Laterality Date   ABDOMINAL HYSTERECTOMY N/A 06/11/2012   Procedure: HYSTERECTOMY ABDOMINAL;  Surgeon: Allena Katz, MD;  Location: [facility];  Service: Gynecology;  Laterality: N/A;   CESAREAN SECTION     x 4   CYSTOSCOPY N/A 06/11/2012   Procedure: CYSTOSCOPY;  Surgeon: Allena Katz, MD;  Location: [facility];  Service: Gynecology;  Laterality: N/A;   HERNIA REPAIR     LAPAROSCOPIC ASSISTED VAGINAL HYSTERECTOMY N/A 06/11/2012   Procedure: LAPAROSCOPIC ASSISTED VAGINAL HYSTERECTOMY atttempted;  Surgeon: Allena Katz, MD;  Location: [facility];  Service: Gynecology;  Laterality: N/A;  Attempted Laparoscopic assisted vaginal hysterectomy.   LAPAROSCOPIC LYSIS OF ADHESIONS N/A 06/11/2012   Procedure: LAPAROSCOPIC LYSIS OF ADHESIONS;  Surgeon: Allena Katz, MD;  Location: [facility];  Service: Gynecology;  Laterality: N/A;   SALPINGOOPHORECTOMY Bilateral 06/11/2012   Procedure: SALPINGO OOPHORECTOMY;  Surgeon: Allena Katz, MD;  Location: [facility];  Service: Gynecology;  Laterality: Bilateral;   Patient Active Problem List   Diagnosis Date Noted   Generalized  anxiety disorder 05/24/2022   Spasticity as late effect of cerebrovascular accident (CVA) 10/21/2021   Abnormal gait due to muscle weakness 10/21/2021   Nausea without vomiting    Spastic hemiparesis (HCC)    History of hypertension    Neurogenic orthostatic hypotension (HCC)    Transaminitis    Right hemiplegia (HCC)    Hemorrhagic stroke (HCC)    Leucocytosis    Essential hypertension    Dyslipidemia    Right hemiparesis (HCC)    ICH (intracerebral hemorrhage) ([place]) 12/01/2020   HYPERLIPIDEMIA 07/06/2009   OBESITY 07/06/2009    REFERRING DIAG: I61.2 (ICD-10-CM) - Nontraumatic hemorrhage of left cerebral hemisphere (HCC) I69.398,R25.2 (ICD-10-CM) - Spasticity as late effect of cerebrovascular accident (CVA) M62.81,R26.9 (ICD-10-  THERAPY DIAG:  Muscle weakness (generalized)  Hemiplegia and hemiparesis following cerebral infarction affecting right dominant side (HCC)  Unsteadiness on feet  Other abnormalities of gait and mobility  Rationale for Evaluation and Treatment Rehabilitation  PERTINENT HISTORY: hx of R hemiplegia due to stroke in 10/22- secondary to L frontal ICH and small SAH, anxiety  PRECAUTIONS: fall, R hemi  SUBJECTIVE: Reports doing well, no changes, is sad about today being last pool visit.   PAIN:  Are you having pain? No   TODAY'S TREATMENT:   Patient seen for aquatic therapy today.  Treatment took place in water 3.6-4.0 feet deep depending upon activity.  Pt entered and  exited the pool via stairs using B rails in alternating fashion.  Pool temp at 92 deg.   In approx 4' dept, had pt ambulate approx 18' x 2 laps forwards without support, backwards x 2 laps (same distance),  side stepping with squat and UE abd/add with small dumbbells x 2 laps (min cues for ensuring RLE doesn't IR during squat).  Ended walking in pool with marching forward with alt UE flex with small dumbbells x2 laps.  Min cues for ensuring midline squat.    NMR:  Standing near side  of pool: kept RLE in stance moving LLE through hip flex/ext with knee ext x 10 reps, holding yellow noodle for support.  LLE in stance, moving R hip into flex/ext this time with knee flexed to 90 deg x 10 reps with support of noodle. Pt having slightly more difficulty today, needing intermittent min A from PT to stabilize noodle, however she is able to self correct balance with stepping strategy.  Standing on blue pool noodle with feet wide, maintaining balance x 2 sets of 20 secs>alt UE raises x 10 reps, and finally side stepping on noodle x 4 reps L and R with intermitent UE support from wall.  Utilized Ai Chi during session for improved midline WB, R knee flex and core strength: "Enclosing" , "Soothing" and modified "Balancing" with use of pool wall for single UE support.   Cues for correct RLE placement and posture throughout.  She tends to want to go into RLE squat when doing "Balancing" rather than hinging from hips.  Improved with increased reps.  Did 10 reps of each posture.     Core/UE strengthening:  standing with feet together holding smaller barbells moving UEs from shoulder flex to ext x 10 reps slowly for core stability.  Progressed to moving arms in opposite directions (flex/ext) slowly x 10 reps then attempting to move faster for more trunk control.  Note that it was difficult to move barbells fast so removed and had her keep hands flat and move arms in opposite directions.  Performed 2 sets of 15 secs with cues for improved core activation and to keep knees "soft."      Pt requires buoyancy of water for support for reduced fall risk and for unloading/reduced stress on joints (Rt ankle) as pt able to tolerate increased standing (esp R SLS)  and ambulation in water compared to that on land; viscosity of water is needed for resistance for strengthening and current of water provides perturbations for challenge for balance training      PATIENT EDUCATION: Education details: Continue HEP   Person educated: Patient and pt's daughter Education method: Explanation, Demonstration  Education comprehension: verbalized/demonstrated understanding     HOME EXERCISE PROGRAM: [id number]  Tall kneeling mini squats with bias towards R side Quadruped hip extensions with leg straight and attempting with knee bent   Emailed to pt 06/06/22: Access Code: [email or web address] URL: [email or web address] Date: 06/06/2022 Prepared by: Cameron Sprang  Exercises - Forward March with Hand Floats  - 1 x daily - 7 x weekly - 1 sets - 10 reps - Standing Hip Flexion Extension at [employer]  - 1 x daily - 7 x weekly - 1 sets - 10 reps - Standing Single Leg Hip Circles with Hand Floats  - 1 x daily - 7 x weekly - 1 sets - 10 reps - Standing Balance on Noodle at [employer]  - 1 x daily - 7 x weekly - 1 sets -  3 reps - 20 secs hold - Standing Balance with Arm Raise on Noodle at [employer]  - 1 x daily - 7 x weekly - 1 sets - 10 reps - Forward Toe Walk with Hand Floats  - 1 x daily - 7 x weekly - 3 sets - 10 reps - Side Stepping with Hand Floats  - 1 x daily - 7 x weekly - 3 sets - 10 reps       GOALS: Goals reviewed with patient? Yes   UPDATED GOAL DATES FOR RE-CERT: **Unable to assess STGs/LTGs due to pt being in the pool, updated/revised goal dates for re-cert   SHORT TERM GOALS: ALL STGS = 06/09/2022    Pt will initiate aquatic therapy.  Baseline: Goal status: MET  2.  Pt will improve TUG time to 23 seconds with no AD in order to demo decrease fall risk. Baseline: with no AD: 28.78 seconds, Goal status: NEW  3.  Pt will improve gait speed with LRAD to at least 1.7 ft/sec in order to demo improved community mobility/decr fall risk Baseline: Gait speed with SPC: 21.47 seconds = 1.52 ft/sec Goal status: NEW   LONG TERM GOALS: ALL LTGS = 07/07/2022    Pt will be independent with final HEP for land/aquatic therapy in order to build upon functional gains made in therapy. Baseline:  Goal  status: ONGOING   2.  Pt will improve TUG time to 20 seconds with no AD in order to demo decrease fall risk. Baseline: with no AD: 28.78 seconds, Goal status: REVISED   3.  Pt will improve gait speed with LRAD vs. No AD to at least 2.0 ft/sec in order to demo improved community mobility. Baseline: Gait speed with SPC: 21.47 seconds = 1.52 ft/sec Goal status: ONGOING   4.  Pt will ambulate at least 500' over unlevel grass surfaces with no AD vs. LRAD with supervision in order to demo improved community mobility over unlevel surfaces  Baseline:  Goal status: NEW    5.  Pt will improve SLS on RLE to at least 2-3 seconds in order to demo improved SLS.  Baseline: <1 second   Goal status: NEW    ASSESSMENT:   CLINICAL IMPRESSION: Skilled session in pool today to utilize buoyancy of water for safety from falling and viscosity for resistance for strengthening and balance.  Pt continues to demonstrate improved balance with needing less support overall, ability to maintain R SLS and increase challenge of tasks.   PT reviewed HEP and emailed to patient.  Pt will see primary PT for one more land session for DC.    OBJECTIVE IMPAIRMENTS Abnormal gait, decreased activity tolerance, decreased balance, decreased coordination, decreased endurance, decreased mobility, difficulty walking, decreased ROM, decreased strength, impaired flexibility, impaired sensation, impaired tone, and postural dysfunction.    ACTIVITY LIMITATIONS carrying, lifting, bending, and locomotion level   PARTICIPATION LIMITATIONS: driving, community activity, and yard work   PERSONAL FACTORS Behavior pattern, Past/current experiences, Time since onset of injury/illness/exacerbation, and 1-2 comorbidities: anxiety  are also affecting patient's functional outcome.    REHAB POTENTIAL: Good   CLINICAL DECISION MAKING: Stable/uncomplicated   EVALUATION COMPLEXITY: Low   PLAN: PT FREQUENCY: 1-2x/week   PT DURATION: 8 weeks    PLANNED INTERVENTIONS: Therapeutic exercises, Therapeutic activity, Neuromuscular re-education, Balance training, Gait training, Patient/Family education, Self Care, Joint mobilization, Stair training, Vestibular training, Orthotic/Fit training, DME instructions, Aquatic Therapy, Electrical stimulation, Manual therapy, and Re-evaluation   PLAN FOR NEXT SESSION:  Remind her to schedule week of April with Chloe. Work on RLE strengthening, balance, weight shifting to R. Gait training and improving quality of gait, try Elliptical again. Get pt in for aquatics. Add balance to HEP as needed. Check STGs next time pt has land therapy   Cameron Sprang, PT, MPT [facility] [address] [place], [place], [address] Phone: [phone]   Fax:  [phone] 06/06/22, 2:09 PM

## 2022-06-09 ENCOUNTER — Encounter: Payer: Self-pay | Admitting: Occupational Therapy

## 2022-06-09 ENCOUNTER — Ambulatory Visit: Payer: 59 | Admitting: Occupational Therapy

## 2022-06-09 DIAGNOSIS — I69318 Other symptoms and signs involving cognitive functions following cerebral infarction: Secondary | ICD-10-CM

## 2022-06-09 DIAGNOSIS — M6281 Muscle weakness (generalized): Secondary | ICD-10-CM | POA: Diagnosis not present

## 2022-06-09 DIAGNOSIS — I69351 Hemiplegia and hemiparesis following cerebral infarction affecting right dominant side: Secondary | ICD-10-CM

## 2022-06-09 DIAGNOSIS — G8111 Spastic hemiplegia affecting right dominant side: Secondary | ICD-10-CM

## 2022-06-09 DIAGNOSIS — R41842 Visuospatial deficit: Secondary | ICD-10-CM

## 2022-06-09 DIAGNOSIS — R29818 Other symptoms and signs involving the nervous system: Secondary | ICD-10-CM

## 2022-06-09 DIAGNOSIS — R278 Other lack of coordination: Secondary | ICD-10-CM

## 2022-06-09 NOTE — Therapy (Signed)
OUTPATIENT OCCUPATIONAL THERAPY NEURO TREATMENT  Patient Name: Felicia Chen MRN: [id number] DOB:1965-06-26, 57 y.o., female Today's Date: 06/09/2022  PCP: Aretta Nip, MD REFERRING PROVIDER: Courtney Heys, MD  END OF SESSION:  OT End of Session - 06/09/22 1029     Visit Number 3    Number of Visits 7    Date for OT Re-Evaluation 06/30/22    Authorization Type UHC - VL 40    OT Start Time 1028    OT Stop Time 1100    OT Time Calculation (min) 32 min    Activity Tolerance Patient tolerated treatment well    Behavior During Therapy WFL for tasks assessed/performed              Past Medical History:  Diagnosis Date   Anxiety    Gilbert's syndrome    Past Surgical History:  Procedure Laterality Date   ABDOMINAL HYSTERECTOMY N/A 06/11/2012   Procedure: HYSTERECTOMY ABDOMINAL;  Surgeon: Allena Katz, MD;  Location: [facility];  Service: Gynecology;  Laterality: N/A;   CESAREAN SECTION     x 4   CYSTOSCOPY N/A 06/11/2012   Procedure: CYSTOSCOPY;  Surgeon: Allena Katz, MD;  Location: [facility];  Service: Gynecology;  Laterality: N/A;   HERNIA REPAIR     LAPAROSCOPIC ASSISTED VAGINAL HYSTERECTOMY N/A 06/11/2012   Procedure: LAPAROSCOPIC ASSISTED VAGINAL HYSTERECTOMY atttempted;  Surgeon: Allena Katz, MD;  Location: [facility];  Service: Gynecology;  Laterality: N/A;  Attempted Laparoscopic assisted vaginal hysterectomy.   LAPAROSCOPIC LYSIS OF ADHESIONS N/A 06/11/2012   Procedure: LAPAROSCOPIC LYSIS OF ADHESIONS;  Surgeon: Allena Katz, MD;  Location: [facility];  Service: Gynecology;  Laterality: N/A;   SALPINGOOPHORECTOMY Bilateral 06/11/2012   Procedure: SALPINGO OOPHORECTOMY;  Surgeon: Allena Katz, MD;  Location: [facility];  Service: Gynecology;  Laterality: Bilateral;   Patient Active Problem List   Diagnosis Date Noted   Generalized anxiety disorder 05/24/2022   Spasticity as late effect of cerebrovascular accident (CVA) 10/21/2021    Abnormal gait due to muscle weakness 10/21/2021   Nausea without vomiting    Spastic hemiparesis (HCC)    History of hypertension    Neurogenic orthostatic hypotension (HCC)    Transaminitis    Right hemiplegia (HCC)    Hemorrhagic stroke (HCC)    Leucocytosis    Essential hypertension    Dyslipidemia    Right hemiparesis (HCC)    ICH (intracerebral hemorrhage) ([place]) 12/01/2020   HYPERLIPIDEMIA 07/06/2009   OBESITY 07/06/2009    ONSET DATE: 12/01/2020  REFERRING DIAG: G81.11 (ICD-10-CM) - Right spastic hemiparesis  THERAPY DIAG:  Muscle weakness (generalized)  Hemiplegia and hemiparesis following cerebral infarction affecting right dominant side (HCC)  Right spastic hemiparesis (HCC)  Other lack of coordination  Visuospatial deficit  Other symptoms and signs involving the nervous system  Other symptoms and signs involving cognitive functions following cerebral infarction  Rationale for Evaluation and Treatment: Rehabilitation  SUBJECTIVE:   SUBJECTIVE STATEMENT: Pt reports she feels had pain in R shoulder with theraband shoulder flexion exercise. She modified the exercise by just not lifting her arm so high.   Pt accompanied by:  daughter Felicia Chen  PERTINENT HISTORY: hx of R hemiplegia due to stroke in 10/22- secondary to L frontal ICH and small SAH, anxiety.    OT eval for RUE strengthening after stroke  PRECAUTIONS: Fall  WEIGHT BEARING RESTRICTIONS: No  PAIN:  Are you having pain? No  FALLS: Has patient  fallen in last 6 months? Yes. 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; was driving  PATIENT GOALS: shoulder mobility, strength, use of RUE  OBJECTIVE:   HAND DOMINANCE: Right  ADLs: Overall ADLs: SBA Equipment: Shower seat with back, Walk in shower, bed side commode, and [place]  IADLs: Shopping: min A Light housekeeping: able to do dishes; requires assistance for vacuuming but has light vacuum that she has not yet tried to use Meal Prep: light meal prep Community mobility: dependent Medication management: mod I with pill boxes Financial management: I Handwriting: 100% legible improved since onset of stroke  MOBILITY STATUS: Needs Assist: SBA to CGA  with use of Hurrycane  ACTIVITY TOLERANCE: Activity tolerance: good  UPPER EXTREMITY ROM:    BUE AROM WFL  UPPER EXTREMITY MMT:     BUE - WFL with exception to R shoulder flexion 4-/5  HAND FUNCTION: Grip strength: Right: 57 lbs; Left: 64.5 lbs  COORDINATION: 9 Hole Peg test: Right: 45 sec; Left: 27 sec  FUNCTIONAL OUTCOME MEASURE: Upper Extremity Functional Index: 61/80    SENSATION: WFL  EDEMA: mild on R  MUSCLE TONE: RUE: Within functional limits  COGNITION: Overall cognitive status: History of cognitive impairments - at baseline  VISION: Subjective report: reports vision is back; denies double vision  PERCEPTION: Improved attention to R side  PRAXIS: WFL  OBSERVATIONS: Pt appears well-kept and has glasses and mask donned. Uses cane with altered gait though no LOB. Pt anxious and provides narrative answers to yew/no questions. Requires cues to remain on task/topic at times.    TODAY'S TREATMENT:                                                                                                                              - Therapeutic exercises completed for duration as noted below including:  OT reviewed R red Thera-Band HEP including shoulder flexion, horizontal abd/add, bicep curl, and tricep extension to promote strengthening of affected extremity and overall  endurance as noted in pt instructions.   OT reviewed R shoulder flexion cane exercise. Pt completing in sitting for  OT initiated [place] as noted in patient instructions including pick up and placement of items, shuffling and turning cards, item stacking and unstacking, rolling a piece of tissue paper into a ball, rolling golf balls in hand, rolling a pen in between fingers and thumb, picking up and storing items in hand,  and then translating stored items to tips of thumb and index finger before placing them individually into a container. Patient able to return demonstration  of all exercises. Handout provided.  PATIENT EDUCATION: Education details: R red theraband HEP review; cane shoulder flex review; coordination HEP Person educated: Patient and Child(ren) Education method: Explanation and Handouts Education comprehension: verbalized understanding, returned demonstration, and needs further education  HOME EXERCISE PROGRAM: 06/02/2022: R Red theraband  06/09/2022: Shoulder flexion cane; R coordination   GOALS:  SHORT TERM GOALS: Target date: 06/02/2022   Patient will be independent with RUE theraband HEP. Baseline: no recall Goal status: MET  2.  Pt will independently recall at least 2 tremor reduction strategies.  Baseline:  Goal status: INITIAL  LONG TERM GOALS: Target date: 06/30/2022  Patient will demonstrate updated RUE HEP with 25% verbal cues or less for proper execution. Baseline:  Goal status: INITIAL  2.  Patient will complete nine-hole peg with use of R in 35 seconds or less. Baseline: 45 seconds Goal status: INITIAL  3.  Pt will demonstrate ability to place 3 lb item on overhead shelf using RUE and good safety. Baseline: unable Goal status: INITIAL  4.  Pt will improve Upper Extremity Functional Index by at least 8 points. Baseline: 61/80 Goal status: INITIAL   ASSESSMENT:  CLINICAL IMPRESSION: Pt would benefit from skilled  OT services in the outpatient setting to work on impairments as noted below to help pt return to PLOF as able.    PERFORMANCE DEFICITS: in functional skills including ADLs, IADLs, coordination, sensation, edema, tone, ROM, strength, pain, Fine motor control, Gross motor control, mobility, balance, endurance, and UE functional use.  IMPAIRMENTS: are limiting patient from ADLs, IADLs, work, leisure, and social participation.   CO-MORBIDITIES: may have co-morbidities  that affects occupational performance. Patient will benefit from skilled OT to address above impairments and improve overall function.  REHAB POTENTIAL: Fair given chronicity of stroke  PLAN:  OT FREQUENCY: 1x/week  OT DURATION:  6 sessions  PLANNED INTERVENTIONS: self care/ADL training, therapeutic exercise, therapeutic activity, neuromuscular re-education, manual therapy, functional mobility training, ultrasound, paraffin, fluidotherapy, moist heat, contrast bath, patient/family education, visual/perceptual remediation/compensation, energy conservation, coping strategies training, DME and/or AE instructions, and Re-evaluation  RECOMMENDED OTHER SERVICES: none at this time  CONSULTED AND AGREED WITH PLAN OF CARE: Patient and family member/caregiver  PLAN FOR NEXT SESSION: review coordination HEP; tremor reduction strategies; ASSESS APPROPRIATENESS [place], OT 06/09/2022, 10:32 AM

## 2022-06-15 ENCOUNTER — Ambulatory Visit: Payer: 59 | Admitting: Occupational Therapy

## 2022-06-15 ENCOUNTER — Encounter: Payer: Self-pay | Admitting: Occupational Therapy

## 2022-06-15 DIAGNOSIS — R278 Other lack of coordination: Secondary | ICD-10-CM

## 2022-06-15 DIAGNOSIS — M6281 Muscle weakness (generalized): Secondary | ICD-10-CM

## 2022-06-15 DIAGNOSIS — I69351 Hemiplegia and hemiparesis following cerebral infarction affecting right dominant side: Secondary | ICD-10-CM

## 2022-06-15 DIAGNOSIS — G8111 Spastic hemiplegia affecting right dominant side: Secondary | ICD-10-CM

## 2022-06-15 NOTE — Therapy (Addendum)
OUTPATIENT OCCUPATIONAL THERAPY NEURO TREATMENT  Patient Name: Felicia Chen MRN: [id number] DOB:17-Feb-1966, 57 y.o., female Today's Date: 06/09/2022  PCP: Felicia Nip, MD REFERRING PROVIDER: Courtney Heys, MD  END OF SESSION:  OT End of Session - 06/09/22 1029     Visit Number 3    Number of Visits 7    Date for OT Re-Evaluation 06/30/22    Authorization Type [employer] - VL 40    OT Start Time 1028    OT Stop Time 1100    OT Time Calculation (min) 32 min    Activity Tolerance Patient tolerated treatment well    Behavior During Therapy WFL for tasks assessed/performed              Past Medical History:  Diagnosis Date   Anxiety    Gilbert's syndrome    Past Surgical History:  Procedure Laterality Date   ABDOMINAL HYSTERECTOMY N/A 06/11/2012   Procedure: HYSTERECTOMY ABDOMINAL;  Surgeon: Felicia Katz, MD;  Location: [facility];  Service: Gynecology;  Laterality: N/A;   CESAREAN SECTION     x 4   CYSTOSCOPY N/A 06/11/2012   Procedure: CYSTOSCOPY;  Surgeon: Felicia Katz, MD;  Location: [facility];  Service: Gynecology;  Laterality: N/A;   HERNIA REPAIR     LAPAROSCOPIC ASSISTED VAGINAL HYSTERECTOMY N/A 06/11/2012   Procedure: LAPAROSCOPIC ASSISTED VAGINAL HYSTERECTOMY atttempted;  Surgeon: Felicia Katz, MD;  Location: [facility];  Service: Gynecology;  Laterality: N/A;  Attempted Laparoscopic assisted vaginal hysterectomy.   LAPAROSCOPIC LYSIS OF ADHESIONS N/A 06/11/2012   Procedure: LAPAROSCOPIC LYSIS OF ADHESIONS;  Surgeon: Felicia Katz, MD;  Location: [facility];  Service: Gynecology;  Laterality: N/A;   SALPINGOOPHORECTOMY Bilateral 06/11/2012   Procedure: SALPINGO OOPHORECTOMY;  Surgeon: Felicia Katz, MD;  Location: [facility];  Service: Gynecology;  Laterality: Bilateral;   Patient Active Problem List   Diagnosis Date Noted   Generalized anxiety disorder 05/24/2022   Spasticity as late effect of cerebrovascular accident (CVA) 10/21/2021    Abnormal gait due to muscle weakness 10/21/2021   Nausea without vomiting    Spastic hemiparesis (HCC)    History of hypertension    Neurogenic orthostatic hypotension (HCC)    Transaminitis    Right hemiplegia (HCC)    Hemorrhagic stroke (HCC)    Leucocytosis    Essential hypertension    Dyslipidemia    Right hemiparesis (HCC)    ICH (intracerebral hemorrhage) ([place]) 12/01/2020   HYPERLIPIDEMIA 07/06/2009   OBESITY 07/06/2009    ONSET DATE: 12/01/2020  REFERRING DIAG: G81.11 (ICD-10-CM) - Right spastic hemiparesis  THERAPY DIAG:  Muscle weakness (generalized)  Hemiplegia and hemiparesis following cerebral infarction affecting right dominant side (HCC)  Right spastic hemiparesis (HCC)  Other lack of coordination  Visuospatial deficit  Other symptoms and signs involving the nervous system  Other symptoms and signs involving cognitive functions following cerebral infarction  Rationale for Evaluation and Treatment: Rehabilitation  SUBJECTIVE:   SUBJECTIVE STATEMENT: Pt reports she feels had pain in R shoulder with theraband shoulder flexion exercise. She modified the exercise by just not lifting her arm so high.   Pt accompanied by:  daughter Felicia Chen  PERTINENT HISTORY: hx of R hemiplegia due to stroke in 10/22- secondary to L frontal ICH and small SAH, anxiety.    OT eval for RUE strengthening after stroke  PRECAUTIONS: Fall  WEIGHT BEARING RESTRICTIONS: No  PAIN:  Are you having pain? No  FALLS: Has patient  fallen in last 6 months? Yes. 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; was driving  PATIENT GOALS: shoulder mobility, strength, use of RUE  OBJECTIVE:   HAND DOMINANCE: Right  ADLs: Overall ADLs: SBA Equipment: Shower seat with back, Walk in shower, bed side commode, and [place]  IADLs: Shopping: min A Light housekeeping: able to do dishes; requires assistance for vacuuming but has light vacuum that she has not yet tried to use Meal Prep: light meal prep Community mobility: dependent Medication management: mod I with pill boxes Financial management: I Handwriting: 100% legible improved since onset of stroke  MOBILITY STATUS: Needs Assist: SBA to CGA  with use of Hurrycane  ACTIVITY TOLERANCE: Activity tolerance: good  UPPER EXTREMITY ROM:    BUE AROM WFL  UPPER EXTREMITY MMT:     BUE - WFL with exception to R shoulder flexion 4-/5  HAND FUNCTION: Grip strength: Right: 57 lbs; Left: 64.5 lbs  COORDINATION: 9 Hole Peg test: Right: 45 sec; Left: 27 sec  FUNCTIONAL OUTCOME MEASURE: Upper Extremity Functional Index: 61/80    SENSATION: WFL  EDEMA: mild on R  MUSCLE TONE: RUE: Within functional limits  COGNITION: Overall cognitive status: History of cognitive impairments - at baseline  VISION: Subjective report: reports vision is back; denies double vision  PERCEPTION: Improved attention to R side  PRAXIS: WFL  OBSERVATIONS: Pt appears well-kept and has glasses and mask donned. Uses cane with altered gait though no LOB. Pt anxious and provides narrative answers to yew/no questions. Requires cues to remain on task/topic at times.    TODAY'S TREATMENT:                                                                                                                              06/15/22 Patient here with daughter.  Discussion regarding aquatic therapy, potential goals and needs.  Patient is familiar from prior PT episode in pool.   Worked on biomechanics of reach in right shoulder - high reach.  Patient with excessive  tension in joint limiting overhead reach or sustained high reach.  Worked on improving external rotation of humerus in closed chain conditions - in sitting then in quadruped.   Guided movement to address overhead reach with shoulder moving downward to allow humerus to move upward at distal end.  Patient with improved overhead reach at end of session.    PATIENT EDUCATION: Education details: R red theraband HEP review; cane shoulder flex review; coordination HEP Person educated: Patient and Child(ren) Education method: Explanation and Handouts Education comprehension: verbalized understanding, returned demonstration, and needs further  education  HOME EXERCISE PROGRAM: 06/02/2022: R Red theraband  06/09/2022: Shoulder flexion cane; R coordination   GOALS:  SHORT TERM GOALS: Target date: 06/02/2022   Patient will be independent with RUE theraband HEP. Baseline: no recall Goal status: MET  2.  Pt will independently complete aquatics HEP Baseline:  Goal status: INITIAL  LONG TERM GOALS: Target date: 06/30/2022  Patient will demonstrate updated RUE HEP with 25% verbal cues or less for proper execution. Baseline:  Goal status: INITIAL  2.  Patient will complete nine-hole peg with use of R in 35 seconds or less. Baseline: 45 seconds Goal status: INITIAL  3.  Pt will demonstrate ability to place 3 lb item on overhead shelf using RUE and good safety. Baseline: unable Goal status: INITIAL  4.  Pt will improve Upper Extremity Functional Index by at least 8 points. Baseline: 61/80 Goal status: INITIAL   ASSESSMENT:  CLINICAL IMPRESSION: Pt would benefit from skilled OT services in the outpatient setting to work on impairments as noted below to help pt return to PLOF as able.    PERFORMANCE DEFICITS: in functional skills including ADLs, IADLs, coordination, sensation, edema, tone, ROM, strength, pain, Fine motor control, Gross motor control, mobility, balance, endurance, and UE  functional use.  IMPAIRMENTS: are limiting patient from ADLs, IADLs, work, leisure, and social participation.   CO-MORBIDITIES: may have co-morbidities  that affects occupational performance. Patient will benefit from skilled OT to address above impairments and improve overall function.  REHAB POTENTIAL: Fair given chronicity of stroke  PLAN:  OT FREQUENCY: 1x/week  OT DURATION:  8 additional  sessions  PLANNED INTERVENTIONS: self care/ADL training, therapeutic exercise, therapeutic activity, neuromuscular re-education, manual therapy, functional mobility training, aquatic therapy, ultrasound, paraffin, fluidotherapy, moist heat, contrast bath, patient/family education, visual/perceptual remediation/compensation, energy conservation, coping strategies training, DME and/or AE instructions, and Re-evaluation  RECOMMENDED OTHER SERVICES: none at this time  CONSULTED AND AGREED WITH PLAN OF CARE: Patient and family member/caregiver  PLAN FOR NEXT SESSION: aquatics for RUE, review coordination HEP; tremor reduction strategies;    Antony Salmon, OTR/L 06/15/22 5:04 PM Phone: [phone] Fax: [phone]

## 2022-06-15 NOTE — Addendum Note (Signed)
Addended by: Mariah Milling on: 06/15/2022 05:05 PM   Modules accepted: Orders

## 2022-06-19 ENCOUNTER — Encounter: Payer: Self-pay | Admitting: Occupational Therapy

## 2022-06-19 ENCOUNTER — Ambulatory Visit: Payer: 59 | Attending: Physical Medicine and Rehabilitation | Admitting: Occupational Therapy

## 2022-06-19 ENCOUNTER — Other Ambulatory Visit: Payer: Self-pay | Admitting: Physical Medicine and Rehabilitation

## 2022-06-19 DIAGNOSIS — R278 Other lack of coordination: Secondary | ICD-10-CM | POA: Diagnosis present

## 2022-06-19 DIAGNOSIS — M6281 Muscle weakness (generalized): Secondary | ICD-10-CM | POA: Insufficient documentation

## 2022-06-19 DIAGNOSIS — I69351 Hemiplegia and hemiparesis following cerebral infarction affecting right dominant side: Secondary | ICD-10-CM | POA: Diagnosis present

## 2022-06-19 DIAGNOSIS — R41842 Visuospatial deficit: Secondary | ICD-10-CM | POA: Diagnosis present

## 2022-06-19 DIAGNOSIS — R2681 Unsteadiness on feet: Secondary | ICD-10-CM | POA: Insufficient documentation

## 2022-06-19 DIAGNOSIS — G8111 Spastic hemiplegia affecting right dominant side: Secondary | ICD-10-CM | POA: Diagnosis present

## 2022-06-19 NOTE — Therapy (Signed)
OUTPATIENT OCCUPATIONAL THERAPY NEURO TREATMENT  Patient Name: Felicia Chen MRN: [id number] DOB:12/30/1965, 57 y.o., female Today's Date: 06/19/2022  PCP: Felicia Nip, MD REFERRING PROVIDER: Courtney Heys, MD  END OF SESSION:  OT End of Session - 06/19/22 1802     Visit Number 5    Number of Visits 12    Date for OT Re-Evaluation 08/15/22    Authorization Type [employer] - VL 40    OT Start Time [id number]    OT Stop Time 1630    OT Time Calculation (min) 45 min    Behavior During Therapy WFL for tasks assessed/performed              Past Medical History:  Diagnosis Date   Anxiety    Gilbert's syndrome    Past Surgical History:  Procedure Laterality Date   ABDOMINAL HYSTERECTOMY N/A 06/11/2012   Procedure: HYSTERECTOMY ABDOMINAL;  Surgeon: Allena Katz, MD;  Location: [facility];  Service: Gynecology;  Laterality: N/A;   CESAREAN SECTION     x 4   CYSTOSCOPY N/A 06/11/2012   Procedure: CYSTOSCOPY;  Surgeon: Allena Katz, MD;  Location: [facility];  Service: Gynecology;  Laterality: N/A;   HERNIA REPAIR     LAPAROSCOPIC ASSISTED VAGINAL HYSTERECTOMY N/A 06/11/2012   Procedure: LAPAROSCOPIC ASSISTED VAGINAL HYSTERECTOMY atttempted;  Surgeon: Allena Katz, MD;  Location: [facility];  Service: Gynecology;  Laterality: N/A;  Attempted Laparoscopic assisted vaginal hysterectomy.   LAPAROSCOPIC LYSIS OF ADHESIONS N/A 06/11/2012   Procedure: LAPAROSCOPIC LYSIS OF ADHESIONS;  Surgeon: Allena Katz, MD;  Location: [facility];  Service: Gynecology;  Laterality: N/A;   SALPINGOOPHORECTOMY Bilateral 06/11/2012   Procedure: SALPINGO OOPHORECTOMY;  Surgeon: Allena Katz, MD;  Location: [facility];  Service: Gynecology;  Laterality: Bilateral;   Patient Active Problem List   Diagnosis Date Noted   Generalized anxiety disorder 05/24/2022   Spasticity as late effect of cerebrovascular accident (CVA) 10/21/2021   Abnormal gait due to muscle weakness 10/21/2021   Nausea  without vomiting    Spastic hemiparesis    History of hypertension    Neurogenic orthostatic hypotension    Transaminitis    Right hemiplegia    Hemorrhagic stroke    Leucocytosis    Essential hypertension    Dyslipidemia    Right hemiparesis    ICH (intracerebral hemorrhage) 12/01/2020   HYPERLIPIDEMIA 07/06/2009   OBESITY 07/06/2009    ONSET DATE: 12/01/2020  REFERRING DIAG: G81.11 (ICD-10-CM) - Right spastic hemiparesis  THERAPY DIAG:  Hemiplegia and hemiparesis following cerebral infarction affecting right dominant side  Right spastic hemiparesis  Other lack of coordination  Muscle weakness (generalized)  Visuospatial deficit  Unsteadiness on feet  Rationale for Evaluation and Treatment: Rehabilitation  SUBJECTIVE:   SUBJECTIVE STATEMENT: Pt reports she feels had pain in R shoulder with theraband shoulder flexion exercise. She modified the exercise by just not lifting her arm so high.   Pt accompanied by:  daughter Felicia Chen  PERTINENT HISTORY: hx of R hemiplegia due to stroke in 10/22- secondary to L frontal ICH and small SAH, anxiety.    OT eval for RUE strengthening after stroke  PRECAUTIONS: Fall  WEIGHT BEARING RESTRICTIONS: No  PAIN:  Are you having pain? No  FALLS: Has patient fallen in last 6 months? Yes. 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; was driving  PATIENT GOALS: shoulder mobility, strength, use of RUE  OBJECTIVE:   HAND DOMINANCE: Right  ADLs: Overall ADLs: SBA Equipment: Shower seat with back, Walk in shower, bed side commode, and  [place]  IADLs: Shopping: min A Light housekeeping: able to do dishes; requires assistance for vacuuming but has light vacuum that she has not yet tried to use Meal Prep: light meal prep Community mobility: dependent Medication management: mod I with pill boxes Financial management: I Handwriting: 100% legible improved since onset of stroke  MOBILITY STATUS: Needs Assist: SBA to CGA  with use of Hurrycane  ACTIVITY TOLERANCE: Activity tolerance: good  UPPER EXTREMITY ROM:    BUE AROM WFL  UPPER EXTREMITY MMT:     BUE - WFL with exception to R shoulder flexion 4-/5  HAND FUNCTION: Grip strength: Right: 57 lbs; Left: 64.5 lbs  COORDINATION: 9 Hole Peg test: Right: 45 sec; Left: 27 sec  FUNCTIONAL OUTCOME MEASURE: Upper Extremity Functional Index: 61/80    SENSATION: WFL  EDEMA: mild on R  MUSCLE TONE: RUE: Within functional limits  COGNITION: Overall cognitive status: History of cognitive impairments - at baseline  VISION: Subjective report: reports vision is back; denies double vision  PERCEPTION: Improved attention to R side  PRAXIS: WFL  OBSERVATIONS: Pt appears well-kept and has glasses and mask donned. Uses cane with altered gait though no LOB. Pt anxious and provides narrative answers to yew/no questions. Requires cues to remain on task/topic at times.    TODAY'S TREATMENT:                                                                                                                              06/19/22 Patient here for first aquatic therapy visit.  Patient here with daughter.  Patient entered and exited the pool via stairs and one railing.  Patient familiar with water therapy as she recently completed program for PT.  Patient eager to complete HEP at her mom's pool starting in May.   Using properties of water - buoyancy and turbulence worked on water walking as warm up.  Followed with modified plank position on pool seat, working to improve shoulder  flexion stretch with humerus in externally rotated position and elbow straight.  Patient having difficulty maintaining her RLE on floor in this position.  Had to consciously work to control activity in leg to maintain contact with pool surface.  Worked toward thread the needle to right and then side plank on right.  Patient needs further work on postural control and controlling / connecting upper and lower trunk. Worked on dynamic balance by modifying [employer] exercises in 4 ft of water (chest deep)  Patient able to maintain  balance for brief periods when allowed a very wide base of support - when base narrowed, patient overcome by even mild turbulence.  Worked on stair rail hangs to pull ups. Followed with active overhead reach in improved range and decreased effort.     PATIENT EDUCATION: Education details: R red theraband HEP review; cane shoulder flex review; coordination HEP Person educated: Patient and Child(ren) Education method: Explanation and Handouts Education comprehension: verbalized understanding, returned demonstration, and needs further education  HOME EXERCISE PROGRAM: 06/02/2022: R Red theraband  06/09/2022: Shoulder flexion cane; R coordination   GOALS:  SHORT TERM GOALS: Target date: 06/02/2022   Patient will be independent with RUE theraband HEP. Baseline: no recall Goal status: MET  2.  Pt will independently complete aquatics HEP Baseline:  Goal status: INITIAL  LONG TERM GOALS: Target date: 08/15/2022  Patient will demonstrate updated RUE HEP with 25% verbal cues or less for proper execution. Baseline:  Goal status: INITIAL  2.  Patient will complete nine-hole peg with use of R in 35 seconds or less. Baseline: 45 seconds Goal status: INITIAL  3.  Pt will demonstrate ability to place 3 lb item on overhead shelf using RUE and good safety. Baseline: unable Goal status: INITIAL  4.  Pt will improve Upper Extremity Functional Index by at least 8  points. Baseline: 61/80 Goal status: INITIAL   ASSESSMENT:  CLINICAL IMPRESSION: Pt is highly motivated to improve overhead reach and gross motor coordination skills.    PERFORMANCE DEFICITS: in functional skills including ADLs, IADLs, coordination, sensation, edema, tone, ROM, strength, pain, Fine motor control, Gross motor control, mobility, balance, endurance, and UE functional use.  IMPAIRMENTS: are limiting patient from ADLs, IADLs, work, leisure, and social participation.   CO-MORBIDITIES: may have co-morbidities  that affects occupational performance. Patient will benefit from skilled OT to address above impairments and improve overall function.  REHAB POTENTIAL: Fair given chronicity of stroke  PLAN:  OT FREQUENCY: 1x/week  OT DURATION:  8 additional  sessions  PLANNED INTERVENTIONS: self care/ADL training, therapeutic exercise, therapeutic activity, neuromuscular re-education, manual therapy, functional mobility training, aquatic therapy, ultrasound, paraffin, fluidotherapy, moist heat, contrast bath, patient/family education, visual/perceptual remediation/compensation, energy conservation, coping strategies training, DME and/or AE instructions, and Re-evaluation  RECOMMENDED OTHER SERVICES: none at this time  CONSULTED AND AGREED WITH PLAN OF CARE: Patient and family member/caregiver  PLAN FOR NEXT SESSION: aquatics for RUE, review coordination HEP; tremor reduction strategies;    Antony Salmon, OTR/L 06/19/22 6:05 PM Phone: [phone] Fax: [phone]

## 2022-06-22 ENCOUNTER — Ambulatory Visit: Payer: 59 | Admitting: Occupational Therapy

## 2022-06-26 ENCOUNTER — Ambulatory Visit: Payer: 59 | Admitting: Occupational Therapy

## 2022-06-26 ENCOUNTER — Encounter: Payer: Self-pay | Admitting: Occupational Therapy

## 2022-06-26 DIAGNOSIS — R2681 Unsteadiness on feet: Secondary | ICD-10-CM

## 2022-06-26 DIAGNOSIS — G8111 Spastic hemiplegia affecting right dominant side: Secondary | ICD-10-CM

## 2022-06-26 DIAGNOSIS — I69351 Hemiplegia and hemiparesis following cerebral infarction affecting right dominant side: Secondary | ICD-10-CM

## 2022-06-26 DIAGNOSIS — M6281 Muscle weakness (generalized): Secondary | ICD-10-CM

## 2022-06-26 DIAGNOSIS — R278 Other lack of coordination: Secondary | ICD-10-CM

## 2022-06-26 NOTE — Therapy (Signed)
OUTPATIENT OCCUPATIONAL THERAPY NEURO TREATMENT  Patient Name: Felicia Chen MRN: [id number] DOB:1965/04/16, 57 y.o., female Today's Date: 06/26/2022  PCP: Clayborn Heron, MD REFERRING PROVIDER: Genice Rouge, MD  END OF SESSION:  OT End of Session - 06/26/22 1658     Visit Number 6    Number of Visits 12    Date for OT Re-Evaluation 08/15/22    Authorization Type [employer] - VL 40    OT Start Time 1415    OT Stop Time 1500    OT Time Calculation (min) 45 min    Equipment Utilized During Treatment floatation equipment    Activity Tolerance Patient tolerated treatment well    Behavior During Therapy WFL for tasks assessed/performed              Past Medical History:  Diagnosis Date   Anxiety    Gilbert's syndrome    Past Surgical History:  Procedure Laterality Date   ABDOMINAL HYSTERECTOMY N/A 06/11/2012   Procedure: HYSTERECTOMY ABDOMINAL;  Surgeon: [name], MD;  Location: [facility];  Service: Gynecology;  Laterality: N/A;   CESAREAN SECTION     x 4   CYSTOSCOPY N/A 06/11/2012   Procedure: CYSTOSCOPY;  Surgeon: [name], MD;  Location: [facility];  Service: Gynecology;  Laterality: N/A;   HERNIA REPAIR     LAPAROSCOPIC ASSISTED VAGINAL HYSTERECTOMY N/A 06/11/2012   Procedure: LAPAROSCOPIC ASSISTED VAGINAL HYSTERECTOMY atttempted;  Surgeon: [name], MD;  Location: [facility];  Service: Gynecology;  Laterality: N/A;  Attempted Laparoscopic assisted vaginal hysterectomy.   LAPAROSCOPIC LYSIS OF ADHESIONS N/A 06/11/2012   Procedure: LAPAROSCOPIC LYSIS OF ADHESIONS;  Surgeon: [name], MD;  Location: [facility];  Service: Gynecology;  Laterality: N/A;   SALPINGOOPHORECTOMY Bilateral 06/11/2012   Procedure: SALPINGO OOPHORECTOMY;  Surgeon: [name], MD;  Location: [facility];  Service: Gynecology;  Laterality: Bilateral;   Patient Active Problem List   Diagnosis Date Noted   Generalized anxiety disorder 05/24/2022   Spasticity as late  effect of cerebrovascular accident (CVA) 10/21/2021   Abnormal gait due to muscle weakness 10/21/2021   Nausea without vomiting    Spastic hemiparesis    History of hypertension    Neurogenic orthostatic hypotension    Transaminitis    Right hemiplegia    Hemorrhagic stroke    Leucocytosis    Essential hypertension    Dyslipidemia    Right hemiparesis    ICH (intracerebral hemorrhage) 12/01/2020   HYPERLIPIDEMIA 07/06/2009   OBESITY 07/06/2009    ONSET DATE: 12/01/2020  REFERRING DIAG: G81.11 (ICD-10-CM) - Right spastic hemiparesis  THERAPY DIAG:  Hemiplegia and hemiparesis following cerebral infarction affecting right dominant side  Right spastic hemiparesis  Other lack of coordination  Muscle weakness (generalized)  Unsteadiness on feet  Rationale for Evaluation and Treatment: Rehabilitation  SUBJECTIVE:   SUBJECTIVE STATEMENT: Pt reports she feels had pain in R shoulder with theraband shoulder flexion exercise. She modified the exercise by just not lifting her arm so high.   Pt accompanied by:  daughter Felicia Chen  PERTINENT HISTORY: hx of R hemiplegia due to stroke in 10/22- secondary to L frontal ICH and small SAH, anxiety.    OT eval for RUE strengthening after stroke  PRECAUTIONS: Fall  WEIGHT BEARING RESTRICTIONS: No  PAIN:  Are you having pain? No  FALLS: Has patient fallen in last 6 months? Yes. 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; was driving  PATIENT GOALS: shoulder mobility, strength, use of RUE  OBJECTIVE:   HAND DOMINANCE: Right  ADLs: Overall  ADLs: SBA Equipment: Shower seat with back, Walk in shower, bed side commode, and HHSH  IADLs: Shopping: min A Light housekeeping: able to do dishes; requires assistance for vacuuming but has light vacuum that she has not yet tried to use Meal Prep: light meal prep Community mobility: dependent Medication management: mod I with pill boxes Financial management: I Handwriting: 100% legible improved since onset of stroke  MOBILITY STATUS: Needs Assist: SBA to CGA  with use of Hurrycane  ACTIVITY TOLERANCE: Activity tolerance: good  UPPER EXTREMITY ROM:    BUE AROM WFL  UPPER EXTREMITY MMT:     BUE - WFL with exception to R shoulder flexion 4-/5  HAND FUNCTION: Grip strength: Right: 57 lbs; Left: 64.5 lbs  COORDINATION: 9 Hole Peg test: Right: 45 sec; Left: 27 sec  FUNCTIONAL OUTCOME MEASURE: Upper Extremity Functional Index: 61/80    SENSATION: WFL  EDEMA: mild on R  MUSCLE TONE: RUE: Within functional limits  COGNITION: Overall cognitive status: History of cognitive impairments - at baseline  VISION: Subjective report: reports vision is back; denies double vision  PERCEPTION: Improved attention to R side  PRAXIS: WFL  OBSERVATIONS: Pt appears well-kept and has glasses and mask donned. Uses cane with altered gait though no LOB. Pt anxious and provides narrative answers to yew/no questions. Requires cues to remain on task/topic at times.    TODAY'S TREATMENT:                                                                                                                              06/26/22 Patient arrived with daughter for aquatic therapy visit.  Patient entered and exited the pool via stairs and one railing.   Using properties of water - buoyancy and turbulence worked on water walking as warm up.  Followed with dynamic standing and balance activity emphasizing more sustained activation in her RLE.  Patient reports doing quite a bit of exercise prior to pool  therapy this afternoon, and plans to attend yoga later today.   Worked in standing on yoga poses - Mountain and tree to draw her attention and muscle activation to her base of support whether double or single limb stance.  Patient with decreased proprioceptive awareness of RLE - so difficult to accurate;y place on LLE,  but improved stance on Right leg noted with slowed movement and simple repetition.   Worked in modified plank position with ball on wall to address core stabilization, and weight shifting left and  right.  Worked on throwing catching water ball to challenge movement outside base of support and to encourage rotational patterns.   Supine floatation to address shoulder range of motion with emphasis on end range IR/ER.     PATIENT EDUCATION: Education details: R red theraband HEP review; cane shoulder flex review; coordination HEP Person educated: Patient and Child(ren) Education method: Explanation and Handouts Education comprehension: verbalized understanding, returned demonstration, and needs further education  HOME EXERCISE PROGRAM: 06/02/2022: R Red theraband  06/09/2022: Shoulder flexion cane; R coordination   GOALS:  SHORT TERM GOALS: Target date: 06/02/2022   Patient will be independent with RUE theraband HEP. Baseline: no recall Goal status: MET  2.  Pt will independently complete aquatics HEP Baseline:  Goal status: INITIAL  LONG TERM GOALS: Target date: 08/15/2022  Patient will demonstrate updated RUE HEP with 25% verbal cues or less for proper execution. Baseline:  Goal status: INITIAL  2.  Patient will complete nine-hole peg with use of R in 35 seconds or less. Baseline: 45 seconds Goal status: INITIAL  3.  Pt will demonstrate ability to place 3 lb item on overhead shelf using RUE and good safety. Baseline: unable Goal status: INITIAL  4.  Pt will improve Upper Extremity Functional Index by at least 8 points. Baseline: 61/80 Goal status:  INITIAL   ASSESSMENT:  CLINICAL IMPRESSION: Pt is highly motivated to improve overhead reach and gross motor coordination skills.    PERFORMANCE DEFICITS: in functional skills including ADLs, IADLs, coordination, sensation, edema, tone, ROM, strength, pain, Fine motor control, Gross motor control, mobility, balance, endurance, and UE functional use.  IMPAIRMENTS: are limiting patient from ADLs, IADLs, work, leisure, and social participation.   CO-MORBIDITIES: may have co-morbidities  that affects occupational performance. Patient will benefit from skilled OT to address above impairments and improve overall function.  REHAB POTENTIAL: Fair given chronicity of stroke  PLAN:  OT FREQUENCY: 1x/week  OT DURATION:  8 additional  sessions  PLANNED INTERVENTIONS: self care/ADL training, therapeutic exercise, therapeutic activity, neuromuscular re-education, manual therapy, functional mobility training, aquatic therapy, ultrasound, paraffin, fluidotherapy, moist heat, contrast bath, patient/family education, visual/perceptual remediation/compensation, energy conservation, coping strategies training, DME and/or AE instructions, and Re-evaluation  RECOMMENDED OTHER SERVICES: none at this time  CONSULTED AND AGREED WITH PLAN OF CARE: Patient and family member/caregiver  PLAN FOR NEXT SESSION: aquatics for RUE, review coordination HEP; tremor reduction strategies;    Bretta BangKris Kyriaki Moder, OTR/L 06/26/22 4:59 PM Phone: [phone] Fax: [phone]

## 2022-06-29 ENCOUNTER — Ambulatory Visit: Payer: 59 | Admitting: Occupational Therapy

## 2022-07-03 ENCOUNTER — Ambulatory Visit: Payer: 59 | Admitting: Occupational Therapy

## 2022-07-03 ENCOUNTER — Encounter: Payer: Self-pay | Admitting: Occupational Therapy

## 2022-07-03 DIAGNOSIS — M6281 Muscle weakness (generalized): Secondary | ICD-10-CM

## 2022-07-03 DIAGNOSIS — R2681 Unsteadiness on feet: Secondary | ICD-10-CM

## 2022-07-03 DIAGNOSIS — R278 Other lack of coordination: Secondary | ICD-10-CM

## 2022-07-03 DIAGNOSIS — I69351 Hemiplegia and hemiparesis following cerebral infarction affecting right dominant side: Secondary | ICD-10-CM

## 2022-07-03 DIAGNOSIS — G8111 Spastic hemiplegia affecting right dominant side: Secondary | ICD-10-CM

## 2022-07-03 NOTE — Therapy (Signed)
OUTPATIENT OCCUPATIONAL THERAPY NEURO TREATMENT  Patient Name: Felicia Chen MRN: [id number] DOB:[birth year], 57 y.o., female Today's Date: 07/03/2022  PCP: Clayborn Heron, MD REFERRING PROVIDER: Genice Rouge, MD  END OF SESSION:  OT End of Session - 07/03/22 2013     Visit Number 7    Number of Visits 12    Date for OT Re-Evaluation 08/15/22    Authorization Type [employer] - VL 40    OT Start Time 1545    OT Stop Time 1625    OT Time Calculation (min) 40 min    Equipment Utilized During Treatment floatation equipment    Activity Tolerance Patient tolerated treatment well    Behavior During Therapy WFL for tasks assessed/performed              Past Medical History:  Diagnosis Date   Anxiety    Gilbert's syndrome    Past Surgical History:  Procedure Laterality Date   ABDOMINAL HYSTERECTOMY N/A 06/11/2012   Procedure: HYSTERECTOMY ABDOMINAL;  Surgeon: [name], MD;  Location: [facility];  Service: Gynecology;  Laterality: N/A;   CESAREAN SECTION     x 4   CYSTOSCOPY N/A 06/11/2012   Procedure: CYSTOSCOPY;  Surgeon: [name], MD;  Location: [facility];  Service: Gynecology;  Laterality: N/A;   HERNIA REPAIR     LAPAROSCOPIC ASSISTED VAGINAL HYSTERECTOMY N/A 06/11/2012   Procedure: LAPAROSCOPIC ASSISTED VAGINAL HYSTERECTOMY atttempted;  Surgeon: [name], MD;  Location: [facility];  Service: Gynecology;  Laterality: N/A;  Attempted Laparoscopic assisted vaginal hysterectomy.   LAPAROSCOPIC LYSIS OF ADHESIONS N/A 06/11/2012   Procedure: LAPAROSCOPIC LYSIS OF ADHESIONS;  Surgeon: [name], MD;  Location: [facility];  Service: Gynecology;  Laterality: N/A;   SALPINGOOPHORECTOMY Bilateral 06/11/2012   Procedure: SALPINGO OOPHORECTOMY;  Surgeon: [name], MD;  Location: [facility];  Service: Gynecology;  Laterality: Bilateral;   Patient Active Problem List   Diagnosis Date Noted   Generalized anxiety disorder 05/24/2022   Spasticity as late  effect of cerebrovascular accident (CVA) 10/21/2021   Abnormal gait due to muscle weakness 10/21/2021   Nausea without vomiting    Spastic hemiparesis    History of hypertension    Neurogenic orthostatic hypotension    Transaminitis    Right hemiplegia    Hemorrhagic stroke    Leucocytosis    Essential hypertension    Dyslipidemia    Right hemiparesis    ICH (intracerebral hemorrhage) 12/01/2020   HYPERLIPIDEMIA 07/06/2009   OBESITY 07/06/2009    ONSET DATE: 12/01/2020  REFERRING DIAG: G81.11 (ICD-10-CM) - Right spastic hemiparesis  THERAPY DIAG:  Hemiplegia and hemiparesis following cerebral infarction affecting right dominant side  Right spastic hemiparesis  Other lack of coordination  Muscle weakness (generalized)  Unsteadiness on feet  Rationale for Evaluation and Treatment: Rehabilitation  SUBJECTIVE:   SUBJECTIVE STATEMENT: I keep trying to remember everything I am learning   Pt accompanied by:  daughter Felicia Chen  PERTINENT HISTORY: hx of R hemiplegia due to stroke in 10/22- secondary to L frontal ICH and small SAH, anxiety.    OT eval for RUE strengthening after stroke  PRECAUTIONS: Fall  WEIGHT BEARING RESTRICTIONS: No  PAIN:  Are you having pain? No  FALLS: Has patient fallen in last 6 months? Yes. 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; was driving  PATIENT GOALS: shoulder mobility, strength, use of RUE  OBJECTIVE:   HAND DOMINANCE: Right  ADLs: Overall ADLs: SBA Equipment: Shower seat with back, Walk in shower, bed side commode, and  HHSH  IADLs: Shopping: min A Light housekeeping: able to do dishes; requires assistance for vacuuming but has light vacuum that she has not yet tried to use Meal Prep: light meal prep Community mobility: dependent Medication management: mod I with pill boxes Financial management: I Handwriting: 100% legible improved since onset of stroke  MOBILITY STATUS: Needs Assist: SBA to CGA  with use of Hurrycane  ACTIVITY TOLERANCE: Activity tolerance: good  UPPER EXTREMITY ROM:    BUE AROM WFL  UPPER EXTREMITY MMT:     BUE - WFL with exception to R shoulder flexion 4-/5  HAND FUNCTION: Grip strength: Right: 57 lbs; Left: 64.5 lbs  COORDINATION: 9 Hole Peg test: Right: 45 sec; Left: 27 sec  FUNCTIONAL OUTCOME MEASURE: Upper Extremity Functional Index: 61/80    SENSATION: WFL  EDEMA: mild on R  MUSCLE TONE: RUE: Within functional limits  COGNITION: Overall cognitive status: History of cognitive impairments - at baseline  VISION: Subjective report: reports vision is back; denies double vision  PERCEPTION: Improved attention to R side  PRAXIS: WFL  OBSERVATIONS: Pt appears well-kept and has glasses and mask donned. Uses cane with altered gait though no LOB. Pt anxious and provides narrative answers to yew/no questions. Requires cues to remain on task/topic at times.    TODAY'S TREATMENT:                                                                                                                              07/03/22 Patient arrived with daughter for aquatic therapy visit.  Patient entered and exited the pool via stairs and one railing.   Using properties of water - buoyancy and turbulence worked on water walking as warm up.  Followed with dynamic standing and balance activity emphasizing more sustained activation in her RLE.  Worked on importance of weight shift versus pulling right leg forward with each step.   Worked in standing on yoga poses - Mountain and tree to  draw her attention and muscle activation to her posture and R hip activation whether double or single limb stance.  Improved over last week.  Worked in plank position on bench and weight shifting left and right.  Transitions from plank to active side plank.   PATIENT EDUCATION: Education details: R red theraband HEP review; cane shoulder flex review; coordination HEP Person educated: Patient and Child(ren) Education method: Explanation and Handouts Education comprehension: verbalized understanding, returned demonstration, and needs further education  HOME EXERCISE PROGRAM: 06/02/2022: R Red theraband  06/09/2022: Shoulder flexion cane; R coordination   GOALS:  SHORT TERM GOALS: Target date: 06/02/2022   Patient will be independent with RUE theraband HEP. Baseline: no recall Goal status: MET  2.  Pt will independently complete aquatics HEP Baseline:  Goal status: INITIAL  LONG TERM GOALS: Target date: 08/15/2022  Patient will demonstrate updated RUE HEP with 25% verbal cues or less for proper execution. Baseline:  Goal status: INITIAL  2.  Patient will complete nine-hole peg with use of R in 35 seconds or less. Baseline: 45 seconds Goal status: INITIAL  3.  Pt will demonstrate ability to place 3 lb item on overhead shelf using RUE and good safety. Baseline: unable Goal status: INITIAL  4.  Pt will improve Upper Extremity Functional Index by at least 8 points. Baseline: 61/80 Goal status: INITIAL   ASSESSMENT:  CLINICAL IMPRESSION: Pt is highly motivated to improve overhead reach and gross motor coordination skills.    PERFORMANCE DEFICITS: in functional skills including ADLs, IADLs, coordination, sensation, edema, tone, ROM, strength, pain, Fine motor control, Gross motor control, mobility, balance, endurance, and UE functional use.  IMPAIRMENTS: are limiting patient from ADLs, IADLs, work, leisure, and social participation.   CO-MORBIDITIES: may have co-morbidities   that affects occupational performance. Patient will benefit from skilled OT to address above impairments and improve overall function.  REHAB POTENTIAL: Fair given chronicity of stroke  PLAN:  OT FREQUENCY: 1x/week  OT DURATION:  8 additional  sessions  PLANNED INTERVENTIONS: self care/ADL training, therapeutic exercise, therapeutic activity, neuromuscular re-education, manual therapy, functional mobility training, aquatic therapy, ultrasound, paraffin, fluidotherapy, moist heat, contrast bath, patient/family education, visual/perceptual remediation/compensation, energy conservation, coping strategies training, DME and/or AE instructions, and Re-evaluation  RECOMMENDED OTHER SERVICES: none at this time  CONSULTED AND AGREED WITH PLAN OF CARE: Patient and family member/caregiver  PLAN FOR NEXT SESSION: aquatics for RUE, review coordination HEP; tremor reduction strategies;    Bretta Bang, OTR/L 07/03/22 8:14 PM Phone: [phone] Fax: [phone]

## 2022-07-10 ENCOUNTER — Ambulatory Visit: Payer: 59 | Admitting: Occupational Therapy

## 2022-07-10 ENCOUNTER — Encounter: Payer: Self-pay | Admitting: Occupational Therapy

## 2022-07-10 DIAGNOSIS — R41842 Visuospatial deficit: Secondary | ICD-10-CM

## 2022-07-10 DIAGNOSIS — I69351 Hemiplegia and hemiparesis following cerebral infarction affecting right dominant side: Secondary | ICD-10-CM

## 2022-07-10 DIAGNOSIS — R278 Other lack of coordination: Secondary | ICD-10-CM

## 2022-07-10 DIAGNOSIS — R2681 Unsteadiness on feet: Secondary | ICD-10-CM

## 2022-07-10 DIAGNOSIS — G8111 Spastic hemiplegia affecting right dominant side: Secondary | ICD-10-CM

## 2022-07-10 DIAGNOSIS — M6281 Muscle weakness (generalized): Secondary | ICD-10-CM

## 2022-07-10 NOTE — Therapy (Signed)
OUTPATIENT OCCUPATIONAL THERAPY NEURO TREATMENT  Patient Name: Felicia Chen MRN: [id number] DOB:1965/03/26, 57 y.o., female Today's Date: 07/10/2022  PCP: Clayborn Heron, MD REFERRING PROVIDER: Genice Rouge, MD  END OF SESSION:  OT End of Session - 07/10/22 1703     Visit Number 8    Number of Visits 12    Date for OT Re-Evaluation 08/15/22    Authorization Type [employer] - VL 40    OT Start Time 1502    OT Stop Time 1545    OT Time Calculation (min) 43 min    Activity Tolerance Patient tolerated treatment well    Behavior During Therapy WFL for tasks assessed/performed              Past Medical History:  Diagnosis Date   Anxiety    Gilbert's syndrome    Past Surgical History:  Procedure Laterality Date   ABDOMINAL HYSTERECTOMY N/A 06/11/2012   Procedure: HYSTERECTOMY ABDOMINAL;  Surgeon: [name], MD;  Location: [facility];  Service: Gynecology;  Laterality: N/A;   CESAREAN SECTION     x 4   CYSTOSCOPY N/A 06/11/2012   Procedure: CYSTOSCOPY;  Surgeon: [name], MD;  Location: [facility];  Service: Gynecology;  Laterality: N/A;   HERNIA REPAIR     LAPAROSCOPIC ASSISTED VAGINAL HYSTERECTOMY N/A 06/11/2012   Procedure: LAPAROSCOPIC ASSISTED VAGINAL HYSTERECTOMY atttempted;  Surgeon: [name], MD;  Location: [facility];  Service: Gynecology;  Laterality: N/A;  Attempted Laparoscopic assisted vaginal hysterectomy.   LAPAROSCOPIC LYSIS OF ADHESIONS N/A 06/11/2012   Procedure: LAPAROSCOPIC LYSIS OF ADHESIONS;  Surgeon: [name], MD;  Location: [facility];  Service: Gynecology;  Laterality: N/A;   SALPINGOOPHORECTOMY Bilateral 06/11/2012   Procedure: SALPINGO OOPHORECTOMY;  Surgeon: [name], MD;  Location: [facility];  Service: Gynecology;  Laterality: Bilateral;   Patient Active Problem List   Diagnosis Date Noted   Generalized anxiety disorder 05/24/2022   Spasticity as late effect of cerebrovascular accident (CVA) 10/21/2021    Abnormal gait due to muscle weakness 10/21/2021   Nausea without vomiting    Spastic hemiparesis    History of hypertension    Neurogenic orthostatic hypotension    Transaminitis    Right hemiplegia    Hemorrhagic stroke    Leucocytosis    Essential hypertension    Dyslipidemia    Right hemiparesis    ICH (intracerebral hemorrhage) 12/01/2020   HYPERLIPIDEMIA 07/06/2009   OBESITY 07/06/2009    ONSET DATE: 12/01/2020  REFERRING DIAG: G81.11 (ICD-10-CM) - Right spastic hemiparesis  THERAPY DIAG:  Hemiplegia and hemiparesis following cerebral infarction affecting right dominant side  Right spastic hemiparesis  Other lack of coordination  Muscle weakness (generalized)  Unsteadiness on feet  Visuospatial deficit  Rationale for Evaluation and Treatment: Rehabilitation  SUBJECTIVE:   SUBJECTIVE STATEMENT: I keep trying to remember everything I am learning   Pt accompanied by:  daughter Felicia Chen  PERTINENT HISTORY: hx of R hemiplegia due to stroke in 10/22- secondary to L frontal ICH and small SAH, anxiety.    OT eval for RUE strengthening after stroke  PRECAUTIONS: Fall  WEIGHT BEARING RESTRICTIONS: No  PAIN:  Are you having pain? No  FALLS: Has patient fallen in last 6 months? Yes. 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; was driving  PATIENT GOALS: shoulder mobility, strength, use of RUE  OBJECTIVE:   HAND DOMINANCE: Right  ADLs: Overall ADLs: SBA Equipment: Shower seat with back, Walk in shower, bed side commode, and HHSH  IADLs: Shopping: min A Light  housekeeping: able to do dishes; requires assistance for vacuuming but has light vacuum that she has not yet tried to use Meal Prep: light meal prep Community mobility: dependent Medication management: mod I with pill boxes Financial management: I Handwriting: 100% legible improved since onset of stroke  MOBILITY STATUS: Needs Assist: SBA to CGA  with use of Hurrycane  ACTIVITY TOLERANCE: Activity tolerance: good  UPPER EXTREMITY ROM:    BUE AROM WFL  UPPER EXTREMITY MMT:     BUE - WFL with exception to R shoulder flexion 4-/5  HAND FUNCTION: Grip strength: Right: 57 lbs; Left: 64.5 lbs  COORDINATION: 9 Hole Peg test: Right: 45 sec; Left: 27 sec  FUNCTIONAL OUTCOME MEASURE: Upper Extremity Functional Index: 61/80    SENSATION: WFL  EDEMA: mild on R  MUSCLE TONE: RUE: Within functional limits  COGNITION: Overall cognitive status: History of cognitive impairments - at baseline  VISION: Subjective report: reports vision is back; denies double vision  PERCEPTION: Improved attention to R side  PRAXIS: WFL  OBSERVATIONS: Pt appears well-kept and has glasses and mask donned. Uses cane with altered gait though no LOB. Pt anxious and provides narrative answers to yew/no questions. Requires cues to remain on task/topic at times.    TODAY'S TREATMENT:                                                                                                                              07/10/22 Patient arrived with daughter for aquatic therapy visit.  Patient entered and exited the pool via stairs and one railing.   Using properties of water - buoyancy and turbulence worked on water walking as warm up.  Followed with dynamic standing and balance activity emphasizing more sustained activation in her RLE.  Worked on importance of weight shift versus pulling right leg forward with each step.   Worked on standing yoga poses - Mountain and tree to draw her attention and muscle activation  to her posture and R hip activation whether double or single limb stance.   More challenging this session - different water shoes and men working on scaffolding with large, loud power equipment during session.   Worked on [employer] exercises to address more dynamic stand balance with water resistance.  Worked in both wide stance and altered stride positions   Discussed benefit of HEP designed to improve shoulder range of motion.  Hope to demonstrate on plinth next session.      PATIENT EDUCATION: Education details: R red  theraband HEP review; cane shoulder flex review; coordination HEP Person educated: Patient and Child(ren) Education method: Explanation and Handouts Education comprehension: verbalized understanding, returned demonstration, and needs further education  HOME EXERCISE PROGRAM: 06/02/2022: R Red theraband  06/09/2022: Shoulder flexion cane; R coordination   GOALS:  SHORT TERM GOALS: Target date: 06/02/2022   Patient will be independent with RUE theraband HEP. Baseline: no recall Goal status: MET  2.  Pt will independently complete aquatics HEP Baseline:  Goal status: INITIAL  LONG TERM GOALS: Target date: 08/15/2022  Patient will demonstrate updated RUE HEP with 25% verbal cues or less for proper execution. Baseline:  Goal status: INITIAL  2.  Patient will complete nine-hole peg with use of R in 35 seconds or less. Baseline: 45 seconds Goal status: INITIAL  3.  Pt will demonstrate ability to place 3 lb item on overhead shelf using RUE and good safety. Baseline: unable Goal status: INITIAL  4.  Pt will improve Upper Extremity Functional Index by at least 8 points. Baseline: 61/80 Goal status: INITIAL   ASSESSMENT:  CLINICAL IMPRESSION: Pt  PERFORMANCE DEFICITS: in functional skills including ADLs, IADLs, coordination, sensation, edema, tone, ROM, strength, pain, Fine motor control, Gross motor control, mobility, balance, endurance, and UE functional  use.  IMPAIRMENTS: are limiting patient from ADLs, IADLs, work, leisure, and social participation.   CO-MORBIDITIES: may have co-morbidities  that affects occupational performance. Patient will benefit from skilled OT to address above impairments and improve overall function.  REHAB POTENTIAL: Fair given chronicity of stroke  PLAN:  OT FREQUENCY: 1x/week  OT DURATION:  8 additional  sessions  PLANNED INTERVENTIONS: self care/ADL training, therapeutic exercise, therapeutic activity, neuromuscular re-education, manual therapy, functional mobility training, aquatic therapy, ultrasound, paraffin, fluidotherapy, moist heat, contrast bath, patient/family education, visual/perceptual remediation/compensation, energy conservation, coping strategies training, DME and/or AE instructions, and Re-evaluation  RECOMMENDED OTHER SERVICES: none at this time  CONSULTED AND AGREED WITH PLAN OF CARE: Patient and family member/caregiver  PLAN FOR NEXT SESSION: aquatics for RUE/ postural control and balance,    Bretta Bang, OTR/L 07/10/2022 5:09 PM Phone: [phone] Fax: [phone]

## 2022-07-17 ENCOUNTER — Encounter: Payer: Self-pay | Admitting: Occupational Therapy

## 2022-07-17 ENCOUNTER — Ambulatory Visit: Payer: 59 | Admitting: Occupational Therapy

## 2022-07-17 DIAGNOSIS — R278 Other lack of coordination: Secondary | ICD-10-CM

## 2022-07-17 DIAGNOSIS — R2681 Unsteadiness on feet: Secondary | ICD-10-CM

## 2022-07-17 DIAGNOSIS — R41842 Visuospatial deficit: Secondary | ICD-10-CM

## 2022-07-17 DIAGNOSIS — I69351 Hemiplegia and hemiparesis following cerebral infarction affecting right dominant side: Secondary | ICD-10-CM

## 2022-07-17 DIAGNOSIS — M6281 Muscle weakness (generalized): Secondary | ICD-10-CM

## 2022-07-17 DIAGNOSIS — G8111 Spastic hemiplegia affecting right dominant side: Secondary | ICD-10-CM

## 2022-07-17 NOTE — Therapy (Signed)
OUTPATIENT OCCUPATIONAL THERAPY NEURO TREATMENT  Patient Name: Felicia Chen MRN: [id number] DOB:1965/03/26, 57 y.o., female Today's Date: 07/10/2022  PCP: Clayborn Heron, MD REFERRING PROVIDER: Genice Rouge, MD  END OF SESSION:  OT End of Session - 07/10/22 1703     Visit Number 8    Number of Visits 12    Date for OT Re-Evaluation 08/15/22    Authorization Type [employer] - VL 40    OT Start Time 1502    OT Stop Time 1545    OT Time Calculation (min) 43 min    Activity Tolerance Patient tolerated treatment well    Behavior During Therapy WFL for tasks assessed/performed              Past Medical History:  Diagnosis Date   Anxiety    Gilbert's syndrome    Past Surgical History:  Procedure Laterality Date   ABDOMINAL HYSTERECTOMY N/A 06/11/2012   Procedure: HYSTERECTOMY ABDOMINAL;  Surgeon: [name], MD;  Location: [facility];  Service: Gynecology;  Laterality: N/A;   CESAREAN SECTION     x 4   CYSTOSCOPY N/A 06/11/2012   Procedure: CYSTOSCOPY;  Surgeon: [name], MD;  Location: [facility];  Service: Gynecology;  Laterality: N/A;   HERNIA REPAIR     LAPAROSCOPIC ASSISTED VAGINAL HYSTERECTOMY N/A 06/11/2012   Procedure: LAPAROSCOPIC ASSISTED VAGINAL HYSTERECTOMY atttempted;  Surgeon: [name], MD;  Location: [facility];  Service: Gynecology;  Laterality: N/A;  Attempted Laparoscopic assisted vaginal hysterectomy.   LAPAROSCOPIC LYSIS OF ADHESIONS N/A 06/11/2012   Procedure: LAPAROSCOPIC LYSIS OF ADHESIONS;  Surgeon: [name], MD;  Location: [facility];  Service: Gynecology;  Laterality: N/A;   SALPINGOOPHORECTOMY Bilateral 06/11/2012   Procedure: SALPINGO OOPHORECTOMY;  Surgeon: [name], MD;  Location: [facility];  Service: Gynecology;  Laterality: Bilateral;   Patient Active Problem List   Diagnosis Date Noted   Generalized anxiety disorder 05/24/2022   Spasticity as late effect of cerebrovascular accident (CVA) 10/21/2021    Abnormal gait due to muscle weakness 10/21/2021   Nausea without vomiting    Spastic hemiparesis    History of hypertension    Neurogenic orthostatic hypotension    Transaminitis    Right hemiplegia    Hemorrhagic stroke    Leucocytosis    Essential hypertension    Dyslipidemia    Right hemiparesis    ICH (intracerebral hemorrhage) 12/01/2020   HYPERLIPIDEMIA 07/06/2009   OBESITY 07/06/2009    ONSET DATE: 12/01/2020  REFERRING DIAG: G81.11 (ICD-10-CM) - Right spastic hemiparesis  THERAPY DIAG:  Hemiplegia and hemiparesis following cerebral infarction affecting right dominant side  Right spastic hemiparesis  Other lack of coordination  Muscle weakness (generalized)  Unsteadiness on feet  Visuospatial deficit  Rationale for Evaluation and Treatment: Rehabilitation  SUBJECTIVE:   SUBJECTIVE STATEMENT: I keep trying to remember everything I am learning   Pt accompanied by:  daughter Felicia Chen  PERTINENT HISTORY: hx of R hemiplegia due to stroke in 10/22- secondary to L frontal ICH and small SAH, anxiety.    OT eval for RUE strengthening after stroke  PRECAUTIONS: Fall  WEIGHT BEARING RESTRICTIONS: No  PAIN:  Are you having pain? No  FALLS: Has patient fallen in last 6 months? Yes. 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; was driving  PATIENT GOALS: shoulder mobility, strength, use of RUE  OBJECTIVE:   HAND DOMINANCE: Right  ADLs: Overall ADLs: SBA Equipment: Shower seat with back, Walk in shower, bed side commode, and HHSH  IADLs: Shopping: min A Light  housekeeping: able to do dishes; requires assistance for vacuuming but has light vacuum that she has not yet tried to use Meal Prep: light meal prep Community mobility: dependent Medication management: mod I with pill boxes Financial management: I Handwriting: 100% legible improved since onset of stroke  MOBILITY STATUS: Needs Assist: SBA to CGA  with use of Hurrycane  ACTIVITY TOLERANCE: Activity tolerance: good  UPPER EXTREMITY ROM:    BUE AROM WFL  UPPER EXTREMITY MMT:     BUE - WFL with exception to R shoulder flexion 4-/5  HAND FUNCTION: Grip strength: Right: 57 lbs; Left: 64.5 lbs  COORDINATION: 9 Hole Peg test: Right: 45 sec; Left: 27 sec  FUNCTIONAL OUTCOME MEASURE: Upper Extremity Functional Index: 61/80    SENSATION: WFL  EDEMA: mild on R  MUSCLE TONE: RUE: Within functional limits  COGNITION: Overall cognitive status: History of cognitive impairments - at baseline  VISION: Subjective report: reports vision is back; denies double vision  PERCEPTION: Improved attention to R side  PRAXIS: WFL  OBSERVATIONS: Pt appears well-kept and has glasses and mask donned. Uses cane with altered gait though no LOB. Pt anxious and provides narrative answers to yew/no questions. Requires cues to remain on task/topic at times.    TODAY'S TREATMENT:                                                                                                                              07/17/22 Patient arrived with daughter for aquatic therapy visit.  Patient entered and exited the pool via stairs and one railing.   Using properties of water - buoyancy and turbulence worked on water walking as warm up.  Followed with dynamic standing and balance activity emphasizing more sustained activation in her RLE.     Continued work on standing yoga poses - Mountain and tree to draw her attention and muscle activation to her posture and R hip activation whether double or single limb stance.   Modified plantigrade to address plank and side plank for more sustained core activation and balance.    Discussed benefit of HEP designed to improve shoulder range of motion.  4Point to child's pose.   Resistance bands to address scapular stabilization in both prone and supine positions against wall.    PATIENT EDUCATION: Education details: R red theraband HEP review; cane shoulder flex review; coordination HEP Person educated: Patient and Child(ren) Education method: Explanation and Handouts Education comprehension: verbalized understanding, returned demonstration, and needs further education  HOME  EXERCISE PROGRAM: 06/02/2022: R Red theraband  06/09/2022: Shoulder flexion cane; R coordination   GOALS:  SHORT TERM GOALS: Target date: 06/02/2022   Patient will be independent with RUE theraband HEP. Baseline: no recall Goal status: MET  2.  Pt will independently complete aquatics HEP Baseline:  Goal status: INITIAL  LONG TERM GOALS: Target date: 08/15/2022  Patient will demonstrate updated RUE HEP with 25% verbal cues or less for proper execution. Baseline:  Goal status: INITIAL  2.  Patient will complete nine-hole peg with use of R in 35 seconds or less. Baseline: 45 seconds Goal status: INITIAL  3.  Pt will demonstrate ability to place 3 lb item on overhead shelf using RUE and good safety. Baseline: unable Goal status: INITIAL  4.  Pt will improve Upper Extremity Functional Index by at least 8 points. Baseline: 61/80 Goal status: INITIAL   ASSESSMENT:  CLINICAL IMPRESSION: Pt continues to show functional improvement as balance as use of RUE improves.  "I just make myself do stuff - like emptying the dishwasher, and that helps." PERFORMANCE DEFICITS: in functional skills including ADLs, IADLs, coordination, sensation, edema, tone, ROM, strength, pain, Fine motor control, Gross motor control, mobility, balance, endurance, and UE functional use.  IMPAIRMENTS: are  limiting patient from ADLs, IADLs, work, leisure, and social participation.   CO-MORBIDITIES: may have co-morbidities  that affects occupational performance. Patient will benefit from skilled OT to address above impairments and improve overall function.  REHAB POTENTIAL: Fair given chronicity of stroke  PLAN:  OT FREQUENCY: 1x/week  OT DURATION:  8 additional  sessions  PLANNED INTERVENTIONS: self care/ADL training, therapeutic exercise, therapeutic activity, neuromuscular re-education, manual therapy, functional mobility training, aquatic therapy, ultrasound, paraffin, fluidotherapy, moist heat, contrast bath, patient/family education, visual/perceptual remediation/compensation, energy conservation, coping strategies training, DME and/or AE instructions, and Re-evaluation  RECOMMENDED OTHER SERVICES: none at this time  CONSULTED AND AGREED WITH PLAN OF CARE: Patient and family member/caregiver  PLAN FOR NEXT SESSION: aquatics for RUE/ postural control and balance,    Bretta Bang, OTR/L 07/10/2022 5:09 PM Phone: [phone] Fax: [phone]

## 2022-07-31 ENCOUNTER — Ambulatory Visit: Payer: 59 | Admitting: Occupational Therapy

## 2022-08-23 ENCOUNTER — Encounter: Payer: Self-pay | Admitting: Physical Medicine and Rehabilitation

## 2022-08-23 ENCOUNTER — Encounter: Payer: 59 | Attending: Physical Medicine and Rehabilitation | Admitting: Physical Medicine and Rehabilitation

## 2022-08-23 VITALS — BP 129/86 | HR 85 | Ht 68.0 in | Wt 232.0 lb

## 2022-08-23 DIAGNOSIS — M6281 Muscle weakness (generalized): Secondary | ICD-10-CM | POA: Diagnosis present

## 2022-08-23 DIAGNOSIS — H8110 Benign paroxysmal vertigo, unspecified ear: Secondary | ICD-10-CM | POA: Insufficient documentation

## 2022-08-23 DIAGNOSIS — I612 Nontraumatic intracerebral hemorrhage in hemisphere, unspecified: Secondary | ICD-10-CM | POA: Diagnosis present

## 2022-08-23 DIAGNOSIS — R269 Unspecified abnormalities of gait and mobility: Secondary | ICD-10-CM | POA: Insufficient documentation

## 2022-08-23 DIAGNOSIS — G8110 Spastic hemiplegia affecting unspecified side: Secondary | ICD-10-CM | POA: Diagnosis present

## 2022-08-23 DIAGNOSIS — G8191 Hemiplegia, unspecified affecting right dominant side: Secondary | ICD-10-CM | POA: Insufficient documentation

## 2022-08-23 MED ORDER — MECLIZINE HCL 12.5 MG PO TABS: 12.5000 mg | ORAL_TABLET | Freq: Three times a day (TID) | ORAL | 1 refills | Status: DC | PRN

## 2022-08-23 NOTE — Patient Instructions (Addendum)
Pt is a 57 yr old female with hx of R hemiplegia due to stroke in 10/22- secondary to L frontal ICH and small SAH- with associated spasticity and post STROKE hypotension- was on Florinef and Midodrine in hospital. Off Florinef and prn midodrine.  Here for f/u on spasticity and stroke.   Worth trying Meclizine- 12.5 mg up to 3x/day as needed- don't take with Xanax.   2. Don't take with Xanax- with the Meclizine   3.  Still on Gabapentin 100 mg QHS- weaning off it- suggest increasing to 200 mg nightly- for now until anxiety calms down.  I would take a little more for 1-2 weeks, then go back to 100 mg nightly.   4.  Doesn't need Baclofen anymore- for spasticity  5.  Con't Trintellix for mood-    6. Daughters did Eppley maneuver for her. Will need to get from them- or call PT and see if they can help with Eppley maneuver and vertigo testing.   7. If they cannot do Vertigo testing and treatment with current PT order, let me know, and I can order.   8. F/U in 3 months- - last fall 2 weeks ago- prior to that, was December 2023.  Of note.   9. Bring AFO to walk in sand.

## 2022-08-23 NOTE — Progress Notes (Signed)
Subjective:    Patient ID: Felicia Chen, female    DOB: [birth year], 57 y.o.   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 Off Florinef and prn midodrine.  Here for f/u on spasticity and stroke.   Bad 2 weeks-  Moving and walking and doing dishes-  Walking with out the cane frequently-   Has gone back to dancing-   Was in kitchen- carrying [place] and making lunch- foot got caught and fell on R shoulder-good therapy avoiding the 4 dogs- 2 pittbull mixes 1 a border collie and 1 a stray- part coyote.   No fractures, no major damage  Doing [facility] therapy again.    Felt OK, but started to be dizzy and be nauseated and having HA's  Having to walk with someone now hold onto her and walk with cane at the same time.   Got Eppley maneuver- was getting better daily- but not back to baseline-   Feels like she's bobbing- like moving Not eating well and nausea has gotten better and HA's have gone away.   Read might help to sleep sitting up - so was sleeping sitting up last few days.  Thinks it's helped some- last 2 days was able to get up to bathroom on own.   [name]- daughter graduating Friday from high school-  Thinks going to go in w/c- just  Eldest got her doctorate and had to be careful where to put her energy.   - Feeling a little off right now- daily.  Didn't call PCP - doesn't  have Meclizine-   Muscle tightness- sometimes jerking when walks down steps- mainly in AM- once stretches, it's pretty much gone/    Writing more- handwriting- making lists mainly- hands shake when nervous.   Pain Inventory Average Pain 1 Pain Right Now 1 My pain is intermittent and aching  LOCATION OF PAIN  legs & arms  BOWEL Number of stools per week:  7   BLADDER Normal    Mobility walk with assistance use a cane how many minutes can you walk? unknown ability to climb steps?  yes do you drive?  no Do you have any goals in this area?  yes  Function I need assistance with the following:  meal prep and household duties Do you have any goals in this area?  yes  Neuro/Psych weakness trouble walking dizziness depression  Prior Studies Any changes since last visit?  no  Physicians involved in your care Any changes since last visit?  no   History reviewed. No pertinent family history. Social History   Socioeconomic History   Marital status: Married    Spouse name: Not on file   Number of children: 5   Years of education: Not on file   Highest education level: Not on file  Occupational History   Not on file  Tobacco Use   Smoking status: Never   Smokeless tobacco: Never  Vaping Use   Vaping Use: Never used  Substance and Sexual Activity   Alcohol use: Not Currently    Comment: rarely   Drug use: No   Sexual activity: Yes    Partners: Male  Other Topics Concern   Not on file  Social History Narrative   Not on file   Social  Determinants of Health   Financial Resource Strain: Not on file  Food Insecurity: Not on file  Transportation Needs: Not on file  Physical Activity: Not on file  Stress: Not on file  Social Connections: Not on file   Past Surgical History:  Procedure Laterality Date   ABDOMINAL HYSTERECTOMY N/A 06/11/2012   Procedure: HYSTERECTOMY ABDOMINAL;  Surgeon: [name], MD;  Location: [facility];  Service: Gynecology;  Laterality: N/A;   CESAREAN SECTION     x 4   CYSTOSCOPY N/A 06/11/2012   Procedure: CYSTOSCOPY;  Surgeon: [name], MD;  Location: [facility];  Service: Gynecology;  Laterality: N/A;   HERNIA REPAIR     LAPAROSCOPIC ASSISTED VAGINAL HYSTERECTOMY N/A 06/11/2012   Procedure: LAPAROSCOPIC ASSISTED VAGINAL HYSTERECTOMY atttempted;  Surgeon: [name], MD;   Location: [facility];  Service: Gynecology;  Laterality: N/A;  Attempted Laparoscopic assisted vaginal hysterectomy.   LAPAROSCOPIC LYSIS OF ADHESIONS N/A 06/11/2012   Procedure: LAPAROSCOPIC LYSIS OF ADHESIONS;  Surgeon: [name], MD;  Location: [facility];  Service: Gynecology;  Laterality: N/A;   SALPINGOOPHORECTOMY Bilateral 06/11/2012   Procedure: SALPINGO OOPHORECTOMY;  Surgeon: [name], MD;  Location: [facility];  Service: Gynecology;  Laterality: Bilateral;   Past Medical History:  Diagnosis Date   Anxiety    Gilbert's syndrome    BP 129/86   Pulse 85   Ht 5\' 8"  (1.727 m)   Wt 232 lb (105.2 kg)   SpO2 95%   BMI 35.28 kg/m   Opioid Risk Score:   Fall Risk Score:  `1  Depression screen [facility] 2/9     08/23/2022   10:48 AM 05/24/2022   10:10 AM 10/21/2021    9:26 AM 04/29/2021   11:49 AM 01/24/2021   12:03 PM  Depression screen PHQ 2/9  Decreased Interest 1 0 0 3 0  Down, Depressed, Hopeless 1 0 0 3 2  PHQ - 2 Score 2 0 0 6 2  Altered sleeping     0  Tired, decreased energy     3  Change in appetite     3  Feeling bad or failure about yourself      0  Trouble concentrating     0  Moving slowly or fidgety/restless     0  Suicidal thoughts     0  PHQ-9 Score     8    Review of Systems  Musculoskeletal:  Positive for gait problem.       Off balance  Neurological:  Positive for dizziness and weakness.  Psychiatric/Behavioral:         Depression, anxiety  All other systems reviewed and are negative.      Objective:   Physical Exam Awake, alert, appropriate, tearful at times; accompanied by 2 daughters, NAD Has SBQC is using.   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 Off Florinef and prn midodrine.  Here for  f/u on spasticity and stroke.   Worth trying Meclizine- 12.5 mg up to 3x/day as needed- don't take with Xanax.   2. Don't take with Xanax- with the Meclizine   3.  Still on Gabapentin 100 mg QHS- weaning off it- suggest increasing to 200 mg nightly- for now until anxiety calms down.  I would take a little more for 1-2 weeks, then go back to 100 mg nightly.   4.  Doesn't need Baclofen anymore- for spasticity  5.  Con't Trintellix for mood-    6. Daughters did Eppley maneuver for her. Will need to get from them- or call PT and see if they can help with Eppley maneuver and vertigo testing.   7. If they cannot do Vertigo testing and treatment with current PT order, let me know, and I can order.   8. F/U in 3 months- - last fall 2 weeks ago- prior to that, was December 2023.  Of note.   9. Bring AFO to walk in sand   I spent a total of 31   minutes on total care today- >50% coordination of care- due to  CVA and new vertigo- to discuss how to get treatment- and anxiety and adding Meclizine.

## 2022-08-25 ENCOUNTER — Encounter: Payer: Self-pay | Admitting: Physical Therapy

## 2022-09-01 ENCOUNTER — Ambulatory Visit: Payer: 59 | Attending: Physical Medicine and Rehabilitation | Admitting: Physical Therapy

## 2022-09-01 ENCOUNTER — Encounter: Payer: Self-pay | Admitting: Physical Therapy

## 2022-09-01 VITALS — BP 122/82 | HR 86

## 2022-09-01 DIAGNOSIS — R2689 Other abnormalities of gait and mobility: Secondary | ICD-10-CM | POA: Diagnosis present

## 2022-09-01 DIAGNOSIS — R2681 Unsteadiness on feet: Secondary | ICD-10-CM | POA: Diagnosis present

## 2022-09-01 DIAGNOSIS — R42 Dizziness and giddiness: Secondary | ICD-10-CM | POA: Insufficient documentation

## 2022-09-01 DIAGNOSIS — I69351 Hemiplegia and hemiparesis following cerebral infarction affecting right dominant side: Secondary | ICD-10-CM | POA: Diagnosis present

## 2022-09-01 DIAGNOSIS — H8110 Benign paroxysmal vertigo, unspecified ear: Secondary | ICD-10-CM | POA: Insufficient documentation

## 2022-09-01 NOTE — Therapy (Signed)
OUTPATIENT PHYSICAL THERAPY VESTIBULAR EVALUATION     Patient Name: Felicia Chen MRN: [id number] DOB:10/19/1965, 57 y.o., female Today's Date: 09/01/2022  END OF SESSION:  PT End of Session - 09/01/22 1225     Visit Number 1    Number of Visits 9    Date for PT Re-Evaluation 10/01/22    Authorization Type [employer]    PT Start Time 1103    PT Stop Time 1213    PT Time Calculation (min) 70 min    Activity Tolerance Patient tolerated treatment well   limited by nausea/dizziness   Behavior During Therapy Anxious             Past Medical History:  Diagnosis Date   Anxiety    Gilbert's syndrome    Past Surgical History:  Procedure Laterality Date   ABDOMINAL HYSTERECTOMY N/A 06/11/2012   Procedure: HYSTERECTOMY ABDOMINAL;  Surgeon: [name], MD;  Location: [facility];  Service: Gynecology;  Laterality: N/A;   CESAREAN SECTION     x 4   CYSTOSCOPY N/A 06/11/2012   Procedure: CYSTOSCOPY;  Surgeon: [name], MD;  Location: [facility];  Service: Gynecology;  Laterality: N/A;   HERNIA REPAIR     LAPAROSCOPIC ASSISTED VAGINAL HYSTERECTOMY N/A 06/11/2012   Procedure: LAPAROSCOPIC ASSISTED VAGINAL HYSTERECTOMY atttempted;  Surgeon: [name], MD;  Location: [facility];  Service: Gynecology;  Laterality: N/A;  Attempted Laparoscopic assisted vaginal hysterectomy.   LAPAROSCOPIC LYSIS OF ADHESIONS N/A 06/11/2012   Procedure: LAPAROSCOPIC LYSIS OF ADHESIONS;  Surgeon: [name], MD;  Location: [facility];  Service: Gynecology;  Laterality: N/A;   SALPINGOOPHORECTOMY Bilateral 06/11/2012   Procedure: SALPINGO OOPHORECTOMY;  Surgeon: [name], MD;  Location: [facility];  Service: Gynecology;  Laterality: Bilateral;   Patient Active Problem List   Diagnosis Date Noted   Generalized anxiety disorder 05/24/2022   Spasticity as late effect of cerebrovascular accident (CVA) 10/21/2021   Abnormal gait due to muscle weakness 10/21/2021   Nausea without vomiting     Spastic hemiparesis (HCC)    History of hypertension    Neurogenic orthostatic hypotension (HCC)    Transaminitis    Right hemiplegia (HCC)    Hemorrhagic stroke (HCC)    Leucocytosis    Essential hypertension    Dyslipidemia    Right hemiparesis (HCC)    ICH (intracerebral hemorrhage) (HCC) 12/01/2020   HYPERLIPIDEMIA 07/06/2009   OBESITY 07/06/2009    PCP: [name], PA-C  REFERRING PROVIDER:  Genice Rouge, MD  REFERRING DIAG: H81.10 (ICD-10-CM) - Benign paroxysmal positional vertigo, unspecified laterality  THERAPY DIAG:  Dizziness and giddiness  Unsteadiness on feet  Other abnormalities of gait and mobility  ONSET DATE: 08/24/2022  Rationale for Evaluation and Treatment: Rehabilitation  SUBJECTIVE:   SUBJECTIVE STATEMENT: Reports everything was going good. Was in the kitchen and was making lunch, something happened with her ankle and just went down on her side. Landed on her R shoulder. No fractures or any damage to her R shoulder. Did not hit her head. Then felt really off balance and got progressively worse to the point of feeling nausea and headaches all the time. Tried the Epley maneuver and did not notice a difference. Is now sleeping in a recliner since she can't lay on her back. Leaves for the beach tomorrow morning. Tried taking Meclizine and reports it made her feel worse. Feeling nausea all the time - not taking anything for her nausea. This  fall happened 3 weeks ago. Using a cane and also having to hold onto one of her daughters for balance. Is very fearful of falling again. Feels like everything is moving. Does not feel good laying flat. Feels like she is just bobbing when sitting on a chair. Going up the stairs is really hard. Not moving as much, feeling stiffer.  Pt accompanied by: self and 2 daughters  PERTINENT HISTORY: PMH: hx of R hemiplegia due to stroke in 10/22- secondary to L frontal ICH and small SAH- with associated spasticity, anxiety    PAIN:  Are you having pain? No  Vitals:   09/01/22 1123  BP: 122/82  Pulse: 86     PRECAUTIONS: Fall  WEIGHT BEARING RESTRICTIONS: No  FALLS: Has patient fallen in last 6 months? Yes. Number of falls 1  LIVING ENVIRONMENT: Lives with: lives with their family and lives with their spouse Lives in: House/apartment Stairs: Yes: Internal: 20 steps; on right going up and on left going up Has following equipment at home: Quad cane small base, SPC with 4 prong tip   PLOF: Independent with basic ADLs, Independent with household mobility with device, and Independent with community mobility with device   PATIENT GOALS: Wants to improve the confidence in her walking, get rid of the dizziness.   OBJECTIVE:   COGNITION: Overall cognitive status: Within functional limits for tasks assessed   MUSCLE TONE:  RLE: Hypertonic  POSTURE:  rounded shoulders   BED MOBILITY:  Pt needing min A at times for rolling or bringing legs on and off   TRANSFERS: Assistive device utilized: None  Sit to stand: CGA Stand to sit: CGA Pt taking incr time to stand up today due to dizziness   GAIT: Gait pattern: step through pattern, decreased step length- Left, decreased stance time- Right, and decreased stride length Distance walked: Clinic distances in and out of session.  Assistive device utilized: Single point cane and with 4 prong tip  Level of assistance: CGA and Min A Comments: Pt needing min A and HHA for balance due to dizziness. Pt guarded with gait and does not turn her head    VESTIBULAR ASSESSMENT:  GENERAL OBSERVATION: Anxious, ambulates in with SPC with 4 prong tip and needing HHA from therapist for balance, pt more unsteady and guarded movements.    SYMPTOM BEHAVIOR:  Subjective history: See above.   Non-Vestibular symptoms: headaches, nausea/vomiting, and close to emesis, but not quite   Type of dizziness: Imbalance (Disequilibrium), Spinning/Vertigo, and "wonky",  "wooo"  Frequency: Daily, after walking feels nauseous   Duration: All the time, exaggerated during movement   Aggravating factors: Induced by position change: lying supine and sit to stand and Induced by motion: occur when walking, looking up at the ceiling, and turning body quickly, tries to avoid head movements   Relieving factors: head stationary and sitting   Progression of symptoms: Unsure   OCULOMOTOR EXAM:  Ocular Alignment: normal  Ocular ROM: No Limitations  Spontaneous Nystagmus: absent  Gaze-Induced Nystagmus: absent  Smooth Pursuits: intact and feel a little nauseous   Saccades: intact and feel a little nauseous   VESTIBULAR - OCULAR REFLEX:   Slow VOR: Normal and Comment: mild dizziness   POSITIONAL TESTING: Right Dix-Hallpike: no nystagmus and pt reporting dizziness in this position  Left Dix-Hallpike: no nystagmus and pt with incr dizziness in this position  Right Roll Test: no nystagmus and mild dizziness  Left Roll Test: no nystagmus and pt more symptomatic when on  L side, looked like there could have been nystagmus when rolling onto back, but unable to determine direction  Right Sidelying: no nystagmus and dizziness Left Sidelying: no nystagmus and dizziness, more so on the L side   Pt more sweaty and anxious after positional testing. Cued for deep breathing. Pt denied use of cold wash cloth   MOTION SENSITIVITY:  Motion Sensitivity Quotient Intensity: 0 = none, 1 = Lightheaded, 2 = Mild, 3 = Moderate, 4 = Severe, 5 = Vomiting  Intensity  1. Sitting to supine 2-3  2. Supine to L side 3  3. Supine to R side 2  4. Supine to sitting 3  5. L Hallpike-Dix 3  6. Up from L  3  7. R Hallpike-Dix 2  8. Up from R  2  9. Sitting, head tipped to L knee   10. Head up from L knee   11. Sitting, head tipped to R knee   12. Head up from R knee   13. Sitting head turns x5   14.Sitting head nods x5   15. In stance, 180 turn to L    16. In stance, 180 turn to R       VESTIBULAR TREATMENT:                                                                                                   DATE: 09/01/22  Access Code: [email or web address] URL: [email or web address] Date: 09/01/2022 Prepared by: Sherlie Ban  Due to pt testing negative for BPPV, but pt still with dizziness and motion sensitivity, provided habituation exercises for pt.  Pt more symptomatic going to the L side, but did improve with incr reps and pt not as symptomatic. Provided as HEP   Program Notes Work on repeated rolling: rolling to the right and waiting for dizziness to go away, then on your back and then roll to the left side. Waiting for dizziness to go away. Perform 4-5 times each side   Exercises - Brandt-Daroff Vestibular Exercise  - 2 x daily - 5 x weekly - 4-5 reps  PATIENT EDUCATION: Education details: Clinical findings, POC, HEP for habituation for rolling and [employer] exercises  Person educated: Patient and pt's 2 daughters  Education method: Programmer, multimedia, Facilities manager, Verbal cues, and Handouts Education comprehension: verbalized understanding and returned demonstration  HOME EXERCISE PROGRAM: For habituation:   Access Code: [email or web address] URL: [email or web address] Date: 09/01/2022 Prepared by: Sherlie Ban  Program Notes Work on repeated rolling: rolling to the right and waiting for dizziness to go away, then on your back and then roll to the left side. Waiting for dizziness to go away. Perform 4-5 times each side   Exercises - Brandt-Daroff Vestibular Exercise  - 2 x daily - 5 x weekly - 4-5 reps  GOALS: Goals reviewed with patient? Yes  SHORT TERM GOALS: ALL STGS = LTGS  LONG TERM GOALS: Target date: 09/29/2022  Pt will be independent with final vestibular HEP to decr dizziness.  Baseline:  Goal status: INITIAL  2.  Pt will report items on MSQ as a 1/5 or less in  order to demo improved dizziness/decr motion sensitivity.   Baseline: 2-3/5 Goal status: INITIAL  3.  Further balance testing to be assessed with LTG written.  Baseline:  Goal status: INITIAL  4.  DVA to be assessed with goal written.  Baseline:  Goal status: INITIAL    ASSESSMENT:  CLINICAL IMPRESSION: Patient is a 57 year old female referred to Neuro OPPT for BPPV/Vertigo.   Pt's PMH is significant for: hx of R hemiplegia due to stroke in 10/22- secondary to L frontal ICH and small SAH- with associated spasticity, anxiety . The following deficits were present during the exam: impaired balance, gait abnormalities with guarding with gait, fear of falling, decr activity tolerance. Pt reporting mild nausea/dizziness with smooth pursuit, saccades, and slow VOR testing. Pt negative for BPPV with positional testing, but pt did have more dizziness with L>R sides, but no nystagmus was noted. Pt also with dizziness with return to upright position. Discussed with pt that it sounds like pt could have had BPPV, but then it was treated with the Epley at home and now it is resolved and that pt now has lingering motion sensitivity/fear of movement. Pt leaving for the beach tomorrow, so kept pt longer to work on habituation exercises for rolling and [employer] and rolling. Pt initially more symptomatic when going to the L, but does improve and is less symptomatic when performing approx. 3 reps each side. Gave pt these to perform at home for habituation. At end of session, pt did report feeling better than when she came in. Will wait until pt gets back from the beach to schedule for more appts and will do further vestibular and balance testing. Pt would benefit from skilled PT to address these impairments and functional limitations to maximize functional mobility independence and decr dizziness.    OBJECTIVE IMPAIRMENTS: Abnormal gait, decreased activity tolerance, decreased balance, decreased coordination, decreased mobility, difficulty walking, and dizziness.    ACTIVITY LIMITATIONS: carrying, lifting, bending, standing, transfers, bed mobility, locomotion level, and caring for others  PARTICIPATION LIMITATIONS: cleaning, laundry, driving, shopping, and community activity  PERSONAL FACTORS: Age, Behavior pattern, Past/current experiences, Time since onset of injury/illness/exacerbation, and 1-2 comorbidities: hx of CVA, anxiety   are also affecting patient's functional outcome.   REHAB POTENTIAL: Good  CLINICAL DECISION MAKING: Evolving/moderate complexity  EVALUATION COMPLEXITY: Moderate   PLAN:  PT FREQUENCY: 2x/week  PT DURATION: 4 weeks  PLANNED INTERVENTIONS: Therapeutic exercises, Therapeutic activity, Neuromuscular re-education, Balance training, Gait training, Patient/Family education, Self Care, Vestibular training, Canalith repositioning, DME instructions, and Re-evaluation  PLAN FOR NEXT SESSION: How is pt's dizziness doing? Finish MSQ, assess balance/DVA as able and add to HEP    Drake Leach, PT, DPT  09/01/2022, 12:37 PM

## 2022-09-13 ENCOUNTER — Encounter: Payer: Self-pay | Admitting: Physical Therapy

## 2022-09-13 ENCOUNTER — Ambulatory Visit: Payer: 59 | Admitting: Physical Therapy

## 2022-09-13 DIAGNOSIS — I69351 Hemiplegia and hemiparesis following cerebral infarction affecting right dominant side: Secondary | ICD-10-CM

## 2022-09-13 DIAGNOSIS — R2681 Unsteadiness on feet: Secondary | ICD-10-CM

## 2022-09-13 DIAGNOSIS — R2689 Other abnormalities of gait and mobility: Secondary | ICD-10-CM

## 2022-09-13 DIAGNOSIS — R42 Dizziness and giddiness: Secondary | ICD-10-CM | POA: Diagnosis not present

## 2022-09-13 NOTE — Patient Instructions (Signed)
Gaze Stabilization: Sitting    Keeping eyes on target on wall a few feet away, tilt head down 15-30 and move head side to side for __30__ seconds. Repeat while moving head up and down for __30__ seconds.  Perform 3 sets of each  Do _1-2___ sessions per day.    Gaze Stabilization: Tip Card  1.Target must remain in focus, not blurry, and appear stationary while head is in motion. 2.Perform exercises with small head movements (45 to either side of midline). 3.Increase speed of head motion so long as target is in focus. 4.If you wear eyeglasses, be sure you can see target through lens (therapist will give specific instructions for bifocal / progressive lenses). 5.These exercises may provoke dizziness or nausea. Work through these symptoms. If too dizzy, slow head movement slightly. Rest between each exercise. 6.Exercises demand concentration; avoid distractions. 7.For safety, perform standing exercises close to a counter, wall, corner, or next to someone.  Copyright  [employer]. All rights reserved.

## 2022-09-13 NOTE — Therapy (Signed)
OUTPATIENT PHYSICAL THERAPY VESTIBULAR TREATMENT     Patient Name: Felicia Chen MRN: [id number] DOB:11-Feb-1966, 57 y.o., female Today's Date: 09/13/2022  END OF SESSION:  PT End of Session - 09/13/22 1535     Visit Number 2    Number of Visits 9    Date for PT Re-Evaluation 10/01/22    Authorization Type [employer]    PT Start Time 1531    PT Stop Time 1612    PT Time Calculation (min) 41 min    Equipment Utilized During Treatment Gait belt    Activity Tolerance Patient tolerated treatment well    Behavior During Therapy --             Past Medical History:  Diagnosis Date   Anxiety    Gilbert's syndrome    Past Surgical History:  Procedure Laterality Date   ABDOMINAL HYSTERECTOMY N/A 06/11/2012   Procedure: HYSTERECTOMY ABDOMINAL;  Surgeon: [name], MD;  Location: [facility];  Service: Gynecology;  Laterality: N/A;   CESAREAN SECTION     x 4   CYSTOSCOPY N/A 06/11/2012   Procedure: CYSTOSCOPY;  Surgeon: [name], MD;  Location: [facility];  Service: Gynecology;  Laterality: N/A;   HERNIA REPAIR     LAPAROSCOPIC ASSISTED VAGINAL HYSTERECTOMY N/A 06/11/2012   Procedure: LAPAROSCOPIC ASSISTED VAGINAL HYSTERECTOMY atttempted;  Surgeon: [name], MD;  Location: [facility];  Service: Gynecology;  Laterality: N/A;  Attempted Laparoscopic assisted vaginal hysterectomy.   LAPAROSCOPIC LYSIS OF ADHESIONS N/A 06/11/2012   Procedure: LAPAROSCOPIC LYSIS OF ADHESIONS;  Surgeon: [name], MD;  Location: [facility];  Service: Gynecology;  Laterality: N/A;   SALPINGOOPHORECTOMY Bilateral 06/11/2012   Procedure: SALPINGO OOPHORECTOMY;  Surgeon: [name], MD;  Location: [facility];  Service: Gynecology;  Laterality: Bilateral;   Patient Active Problem List   Diagnosis Date Noted   Generalized anxiety disorder 05/24/2022   Spasticity as late effect of cerebrovascular accident (CVA) 10/21/2021   Abnormal gait due to muscle weakness 10/21/2021   Nausea  without vomiting    Spastic hemiparesis (HCC)    History of hypertension    Neurogenic orthostatic hypotension (HCC)    Transaminitis    Right hemiplegia (HCC)    Hemorrhagic stroke (HCC)    Leucocytosis    Essential hypertension    Dyslipidemia    Right hemiparesis (HCC)    ICH (intracerebral hemorrhage) (HCC) 12/01/2020   HYPERLIPIDEMIA 07/06/2009   OBESITY 07/06/2009    PCP: [name], PA-C  REFERRING PROVIDER:  Genice Rouge, MD  REFERRING DIAG: H81.10 (ICD-10-CM) - Benign paroxysmal positional vertigo, unspecified laterality  THERAPY DIAG:  Dizziness and giddiness  Unsteadiness on feet  Other abnormalities of gait and mobility  Hemiplegia and hemiparesis following cerebral infarction affecting right dominant side (HCC)  ONSET DATE: 08/24/2022  Rationale for Evaluation and Treatment: Rehabilitation  SUBJECTIVE:   SUBJECTIVE STATEMENT: Feels a lot better since she was last here and has been doing her exercises. Had a good time at the beach. Dizziness is doing much better. Still feeling a little dizzy from the exercises, but not as bad as it was. Back to sleeping in the bed. Does not get as dizzy with laying down. Doesn't feel as off anymore when standing up.   Pt accompanied by: self and daughter, Felicia Chen  PERTINENT HISTORY: PMH: hx of R hemiplegia due to stroke in 10/22- secondary to L frontal ICH and small SAH- with associated spasticity, anxiety  PAIN:  Are you having pain? No  There were no vitals filed for this visit.    PRECAUTIONS: Fall  WEIGHT BEARING RESTRICTIONS: No  FALLS: Has patient fallen in last 6 months? Yes. Number of falls 1  LIVING ENVIRONMENT: Lives with: lives with their family and lives with their spouse Lives in: House/apartment Stairs: Yes: Internal: 20 steps; on right going up and on left going up Has following equipment at home: Quad cane small base, SPC with 4 prong tip   PLOF: Independent with basic ADLs, Independent  with household mobility with device, and Independent with community mobility with device   PATIENT GOALS: Wants to improve the confidence in her walking, get rid of the dizziness.   OBJECTIVE:   COGNITION: Overall cognitive status: Within functional limits for tasks assessed  TRANSFERS: Assistive device utilized: None  Sit to stand: CGA Stand to sit: CGA Pt taking incr time to stand up today due to dizziness    VESTIBULAR TREATMENT:                                                                                                   DATE: 09/13/22   TRANSFERS: Assistive device utilized: None  Sit to stand: SBA Stand to sit: SBA Pt not as dizzy standing up   GAIT: Gait pattern: step through pattern, decreased step length- Left, decreased stance time- Right, and decreased stride length Distance walked: Clinic distances in and out of session.  Assistive device utilized: Single point cane and with 4 prong tip  Level of assistance: Supervision  Comments: Pt not feeling as dizzy with gait   Bolded performed on 09/13/22  MOTION SENSITIVITY:  Motion Sensitivity Quotient Intensity: 0 = none, 1 = Lightheaded, 2 = Mild, 3 = Moderate, 4 = Severe, 5 = Vomiting  Intensity  1. Sitting to supine 2-3  2. Supine to L side 3  3. Supine to R side 2  4. Supine to sitting 3  5. L Hallpike-Dix 3  6. Up from L  3  7. R Hallpike-Dix 2  8. Up from R  2  9. Sitting, head tipped to L knee 0  10. Head up from L knee 0  11. Sitting, head tipped to R knee 1/2  12. Head up from R knee 1/2  13. Sitting head turns x5 1  14.Sitting head nods x5 .5  15. In stance, 180 turn to L  0  16. In stance, 180 turn to R 0  Standing 5 reps head turns: 1/2 Standing 5 reps head nods: 1/2   VESTIBULAR - OCULAR REFLEX:   Slow VOR: Normal  VOR Cancellation: Normal, 1.5 dizziness  Head Impulse Test: R: negative, L: positive, 2/5 dizziness     M-CTSIB  Condition 1: Firm Surface, EO 30 Sec, Normal Sway   Condition 2: Firm Surface, EC 30 Sec, Mild Sway  Condition 3: Foam Surface, EO 30 Sec, Mild Sway  Condition 4: Foam Surface, EC 30 Sec, Mild/Moderate Sway      Gaze Adaptation: x1 Viewing Horizontal: Position: Seated, Time: 30 seconds, Reps: 2, and  Comment: Feeling off, not as bad as the first time  x1 Viewing Vertical:  Position: Seated, Time: 30, Reps: 2, and Comment: harder to keep eyes on the X initially, not as symptomatic as horizontal   PATIENT EDUCATION: Education details: Assessment findings, addition of seated VOR to HEP, purpose of vestibular system  Person educated: Patient and pt's daughter  Education method: Explanation, Demonstration, Verbal cues, and Handouts Education comprehension: verbalized understanding and returned demonstration  HOME EXERCISE PROGRAM: Seated VOR horizontal and vertical direction x30 seconds   For habituation:   Access Code: [email or web address] URL: [email or web address] Date: 09/01/2022 Prepared by: Sherlie Ban  Program Notes Work on repeated rolling: rolling to the right and waiting for dizziness to go away, then on your back and then roll to the left side. Waiting for dizziness to go away. Perform 4-5 times each side   Exercises - Brandt-Daroff Vestibular Exercise  - 2 x daily - 5 x weekly - 4-5 reps  GOALS: Goals reviewed with patient? Yes  SHORT TERM GOALS: ALL STGS = LTGS  LONG TERM GOALS: Target date: 09/29/2022  Pt will be independent with final vestibular HEP to decr dizziness.  Baseline:  Goal status: INITIAL  2.  Pt will report items on MSQ as a 1/5 or less in order to demo improved dizziness/decr motion sensitivity.  Baseline: 2-3/5 Goal status: INITIAL  3.  Pt will perform condition 4 of mCTSIB for 30 seconds with normal/mild sway in order to demo improved vestibular input.  Baseline: moderate sway Goal status: INITIAL  4.  DVA to be assessed with goal written.  Baseline:  Goal status:  INITIAL    ASSESSMENT:  CLINICAL IMPRESSION: Pt returns to PT after vacation at the beach and pt reports improvements in her dizziness since she was last here after moving more and performing habituation exercises Francee Piccolo Daroff and repeated rolling). 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 Pt able to hold condition 4 of mCTSIB for 30 seconds, but did demo moderate postural sway, indicating decr vestibular input for balance. Added seated VOR exercises to pt's HEP with pt having more difficulty in the horizontal direction. Added to pt's HEP. Will continue per POC.    OBJECTIVE IMPAIRMENTS: Abnormal gait, decreased activity tolerance, decreased balance, decreased coordination, decreased mobility, difficulty walking, and dizziness.   ACTIVITY LIMITATIONS: carrying, lifting, bending, standing, transfers, bed mobility, locomotion level, and caring for others  PARTICIPATION LIMITATIONS: cleaning, laundry, driving, shopping, and community activity  PERSONAL FACTORS: Age, Behavior pattern, Past/current experiences, Time since onset of injury/illness/exacerbation, and 1-2 comorbidities: hx of CVA, anxiety   are also affecting patient's functional outcome.   REHAB POTENTIAL: Good  CLINICAL DECISION MAKING: Evolving/moderate complexity  EVALUATION COMPLEXITY: Moderate   PLAN:  PT FREQUENCY: 2x/week  PT DURATION: 4 weeks  PLANNED INTERVENTIONS: Therapeutic exercises, Therapeutic activity, Neuromuscular re-education, Balance training, Gait training, Patient/Family education, Self Care, Vestibular training, Canalith repositioning, DME instructions, and Re-evaluation  PLAN FOR NEXT SESSION: assess DVA, work on motion sensitivities, progress VOR, balance with EC, head motions    Drake Leach, PT, DPT  09/13/2022, 4:18 PM

## 2022-09-15 ENCOUNTER — Encounter: Payer: Self-pay | Admitting: Physical Therapy

## 2022-09-15 ENCOUNTER — Ambulatory Visit: Payer: 59 | Admitting: Physical Therapy

## 2022-09-15 DIAGNOSIS — R2681 Unsteadiness on feet: Secondary | ICD-10-CM

## 2022-09-15 DIAGNOSIS — R42 Dizziness and giddiness: Secondary | ICD-10-CM

## 2022-09-15 DIAGNOSIS — R2689 Other abnormalities of gait and mobility: Secondary | ICD-10-CM

## 2022-09-15 NOTE — Therapy (Signed)
OUTPATIENT PHYSICAL THERAPY VESTIBULAR TREATMENT     Patient Name: Felicia Chen MRN: [id number] DOB:January 07, 1966, 57 y.o., female Today's Date: 09/15/2022  END OF SESSION:  PT End of Session - 09/15/22 1023     Visit Number 3    Number of Visits 9    Date for PT Re-Evaluation 10/01/22    Authorization Type [employer]    PT Start Time 1019    PT Stop Time 1100    PT Time Calculation (min) 41 min    Equipment Utilized During Treatment Gait belt    Activity Tolerance Patient tolerated treatment well    Behavior During Therapy WFL for tasks assessed/performed             Past Medical History:  Diagnosis Date   Anxiety    Gilbert's syndrome    Past Surgical History:  Procedure Laterality Date   ABDOMINAL HYSTERECTOMY N/A 06/11/2012   Procedure: HYSTERECTOMY ABDOMINAL;  Surgeon: [name], MD;  Location: [facility];  Service: Gynecology;  Laterality: N/A;   CESAREAN SECTION     x 4   CYSTOSCOPY N/A 06/11/2012   Procedure: CYSTOSCOPY;  Surgeon: [name], MD;  Location: [facility];  Service: Gynecology;  Laterality: N/A;   HERNIA REPAIR     LAPAROSCOPIC ASSISTED VAGINAL HYSTERECTOMY N/A 06/11/2012   Procedure: LAPAROSCOPIC ASSISTED VAGINAL HYSTERECTOMY atttempted;  Surgeon: [name], MD;  Location: [facility];  Service: Gynecology;  Laterality: N/A;  Attempted Laparoscopic assisted vaginal hysterectomy.   LAPAROSCOPIC LYSIS OF ADHESIONS N/A 06/11/2012   Procedure: LAPAROSCOPIC LYSIS OF ADHESIONS;  Surgeon: [name], MD;  Location: [facility];  Service: Gynecology;  Laterality: N/A;   SALPINGOOPHORECTOMY Bilateral 06/11/2012   Procedure: SALPINGO OOPHORECTOMY;  Surgeon: [name], MD;  Location: [facility];  Service: Gynecology;  Laterality: Bilateral;   Patient Active Problem List   Diagnosis Date Noted   Generalized anxiety disorder 05/24/2022   Spasticity as late effect of cerebrovascular accident (CVA) 10/21/2021   Abnormal gait due to muscle  weakness 10/21/2021   Nausea without vomiting    Spastic hemiparesis (HCC)    History of hypertension    Neurogenic orthostatic hypotension (HCC)    Transaminitis    Right hemiplegia (HCC)    Hemorrhagic stroke (HCC)    Leucocytosis    Essential hypertension    Dyslipidemia    Right hemiparesis (HCC)    ICH (intracerebral hemorrhage) (HCC) 12/01/2020   HYPERLIPIDEMIA 07/06/2009   OBESITY 07/06/2009    PCP: [name], PA-C  REFERRING PROVIDER:  Genice Rouge, MD  REFERRING DIAG: H81.10 (ICD-10-CM) - Benign paroxysmal positional vertigo, unspecified laterality  THERAPY DIAG:  Dizziness and giddiness  Unsteadiness on feet  Other abnormalities of gait and mobility  ONSET DATE: 08/24/2022  Rationale for Evaluation and Treatment: Rehabilitation  SUBJECTIVE:   SUBJECTIVE STATEMENT: Going to the pool later today. Dizziness is improving.   accompanied by: self and daughter, [name]  PERTINENT HISTORY: PMH: hx of R hemiplegia due to stroke in 10/22- secondary to L frontal ICH and small SAH- with associated spasticity, anxiety   PAIN:  Are you having pain? No  There were no vitals filed for this visit.    PRECAUTIONS: Fall  WEIGHT BEARING RESTRICTIONS: No  FALLS: Has patient fallen in last 6 months? Yes. Number of falls 1  LIVING ENVIRONMENT: Lives with: lives with their family and lives with their spouse Lives in: House/apartment Stairs: Yes: Internal: 20 steps; on right  going up and on left going up Has following equipment at home: Quad cane small base, SPC with 4 prong tip   PLOF: Independent with basic ADLs, Independent with household mobility with device, and Independent with community mobility with device   PATIENT GOALS: Wants to improve the confidence in her walking, get rid of the dizziness.   OBJECTIVE:   COGNITION: Overall cognitive status: Within functional limits for tasks assessed  TRANSFERS: Assistive device utilized: None  Sit to  stand: CGA Stand to sit: CGA Pt taking incr time to stand up today due to dizziness    VESTIBULAR TREATMENT:                                                                                                   DATE: 09/15/22  Therapeutic Activity:   GAIT: Gait pattern: step through pattern, decreased step length- Left, decreased stance time- Right, and decreased stride length Distance walked: Clinic distances in and out of session.  Assistive device utilized: Single point cane and with 4 prong tip  Level of assistance: Supervision  Comments: Trialed the Ossur foot up brace for improved foot clearance to RLE during gait and improved stability when fatigued. Ambulated approx. 100' with foot up brace with pt demonstrating improvement and also reported that this feels better than the current foot up brace that she is using. Showed pt where to purchase for use for home.   DVA:  Static: Line 10 Dynamic: Line 5 Feeling off afterwards   NMR:  In corner On level ground: feet hip width > feet together, holding ball and making circles in CW and CCW directions for improved gaze stabilization, 2 x 10 reps each direction, no dizziness, just feeling off balance On air ex: Eyes closed; feet width x30 seconds, feet closer together x30 seconds (pt with incr R ankle inversion) With EO: feet hip width distance: x10 reps head turns, x10 reps head nods, x10 reps diagonals each direction    PATIENT EDUCATION: Education details: Results of DVA, where to purchase ossur foot up brace, can add balance with EC on pillow to HEP with feet apart, can also work on gait with head motions at home in the pool  Person educated: Patient and pt's daughter  Education method: Programmer, multimedia, Demonstration, Verbal cues, and Handouts Education comprehension: verbalized understanding and returned demonstration  HOME EXERCISE PROGRAM: Seated VOR horizontal and vertical direction x30 seconds  Standing on foam with eyes closed and  hip distance x30 seconds  For habituation:   Access Code: [email or web address] URL: [email or web address] Date: 09/01/2022 Prepared by: Sherlie Ban  Program Notes Work on repeated rolling: rolling to the right and waiting for dizziness to go away, then on your back and then roll to the left side. Waiting for dizziness to go away. Perform 4-5 times each side   Exercises - Brandt-Daroff Vestibular Exercise  - 2 x daily - 5 x weekly - 4-5 reps  GOALS: Goals reviewed with patient? Yes  SHORT TERM GOALS: ALL STGS = LTGS  LONG TERM GOALS: Target date: 09/29/2022  Pt will be independent with final vestibular  HEP to decr dizziness.  Baseline:  Goal status: INITIAL  2.  Pt will report items on MSQ as a 1/5 or less in order to demo improved dizziness/decr motion sensitivity.  Baseline: 2-3/5 Goal status: INITIAL  3.  Pt will perform condition 4 of mCTSIB for 30 seconds with normal/mild sway in order to demo improved vestibular input.  Baseline: moderate sway Goal status: INITIAL  4.  Pt will perform DVA in 3 line difference or less in order to demo improved VOR.  Baseline: 5 line difference  Goal status: INITIAL    ASSESSMENT:  CLINICAL IMPRESSION:  Pt still reporting improvements in her dizziness, main thing is just more of an unsteadiness. Assessed DVA with pt having a 5 line difference and pt feeling off afterwards, indicating impaired VOR. Updated goal as appropriate. Remainder of session trialed Ossur foot up brace for R foot clearance during gait and performed exercises for vestibular input for balance. Pt really liked the ossur foot up brace and felt like it helped clear her foot more than the one she currently has and plans to purchase. Will continue per POC.    OBJECTIVE IMPAIRMENTS: Abnormal gait, decreased activity tolerance, decreased balance, decreased coordination, decreased mobility, difficulty walking, and dizziness.   ACTIVITY LIMITATIONS: carrying,  lifting, bending, standing, transfers, bed mobility, locomotion level, and caring for others  PARTICIPATION LIMITATIONS: cleaning, laundry, driving, shopping, and community activity  PERSONAL FACTORS: Age, Behavior pattern, Past/current experiences, Time since onset of injury/illness/exacerbation, and 1-2 comorbidities: hx of CVA, anxiety   are also affecting patient's functional outcome.   REHAB POTENTIAL: Good  CLINICAL DECISION MAKING: Evolving/moderate complexity  EVALUATION COMPLEXITY: Moderate   PLAN:  PT FREQUENCY: 2x/week  PT DURATION: 4 weeks  PLANNED INTERVENTIONS: Therapeutic exercises, Therapeutic activity, Neuromuscular re-education, Balance training, Gait training, Patient/Family education, Self Care, Vestibular training, Canalith repositioning, DME instructions, and Re-evaluation  PLAN FOR NEXT SESSION: work on motion sensitivities, progress VOR, balance with EC, head motions    Drake Leach, PT, DPT  09/15/2022, 12:02 PM

## 2022-09-18 ENCOUNTER — Ambulatory Visit: Payer: 59 | Admitting: Physical Therapy

## 2022-09-22 ENCOUNTER — Ambulatory Visit: Payer: 59 | Attending: Physical Medicine and Rehabilitation | Admitting: Physical Therapy

## 2022-09-22 ENCOUNTER — Encounter: Payer: Self-pay | Admitting: Physical Therapy

## 2022-09-22 DIAGNOSIS — R2689 Other abnormalities of gait and mobility: Secondary | ICD-10-CM | POA: Diagnosis present

## 2022-09-22 DIAGNOSIS — I69351 Hemiplegia and hemiparesis following cerebral infarction affecting right dominant side: Secondary | ICD-10-CM | POA: Insufficient documentation

## 2022-09-22 DIAGNOSIS — R42 Dizziness and giddiness: Secondary | ICD-10-CM | POA: Insufficient documentation

## 2022-09-22 DIAGNOSIS — R2681 Unsteadiness on feet: Secondary | ICD-10-CM | POA: Diagnosis present

## 2022-09-22 NOTE — Therapy (Signed)
OUTPATIENT PHYSICAL THERAPY VESTIBULAR TREATMENT     Patient Name: Felicia Chen MRN: [id number] DOB:01-22-1966, 57 y.o., female Today's Date: 09/22/2022  END OF SESSION:  PT End of Session - 09/22/22 1020     Visit Number 4    Number of Visits 9    Date for PT Re-Evaluation 10/01/22    Authorization Type [employer]    PT Start Time 1018    PT Stop Time 1059    PT Time Calculation (min) 41 min    Equipment Utilized During Treatment Gait belt    Activity Tolerance Patient tolerated treatment well    Behavior During Therapy WFL for tasks assessed/performed             Past Medical History:  Diagnosis Date   Anxiety    Gilbert's syndrome    Past Surgical History:  Procedure Laterality Date   ABDOMINAL HYSTERECTOMY N/A 06/11/2012   Procedure: HYSTERECTOMY ABDOMINAL;  Surgeon: [name], MD;  Location: [facility];  Service: Gynecology;  Laterality: N/A;   CESAREAN SECTION     x 4   CYSTOSCOPY N/A 06/11/2012   Procedure: CYSTOSCOPY;  Surgeon: [name], MD;  Location: [facility];  Service: Gynecology;  Laterality: N/A;   HERNIA REPAIR     LAPAROSCOPIC ASSISTED VAGINAL HYSTERECTOMY N/A 06/11/2012   Procedure: LAPAROSCOPIC ASSISTED VAGINAL HYSTERECTOMY atttempted;  Surgeon: [name], MD;  Location: [facility];  Service: Gynecology;  Laterality: N/A;  Attempted Laparoscopic assisted vaginal hysterectomy.   LAPAROSCOPIC LYSIS OF ADHESIONS N/A 06/11/2012   Procedure: LAPAROSCOPIC LYSIS OF ADHESIONS;  Surgeon: [name], MD;  Location: [facility];  Service: Gynecology;  Laterality: N/A;   SALPINGOOPHORECTOMY Bilateral 06/11/2012   Procedure: SALPINGO OOPHORECTOMY;  Surgeon: [name], MD;  Location: [facility];  Service: Gynecology;  Laterality: Bilateral;   Patient Active Problem List   Diagnosis Date Noted   Generalized anxiety disorder 05/24/2022   Spasticity as late effect of cerebrovascular accident (CVA) 10/21/2021   Abnormal gait due to muscle  weakness 10/21/2021   Nausea without vomiting    Spastic hemiparesis (HCC)    History of hypertension    Neurogenic orthostatic hypotension (HCC)    Transaminitis    Right hemiplegia (HCC)    Hemorrhagic stroke (HCC)    Leucocytosis    Essential hypertension    Dyslipidemia    Right hemiparesis (HCC)    ICH (intracerebral hemorrhage) (HCC) 12/01/2020   HYPERLIPIDEMIA 07/06/2009   OBESITY 07/06/2009    PCP: [name], PA-C  REFERRING PROVIDER:  Genice Rouge, MD  REFERRING DIAG: H81.10 (ICD-10-CM) - Benign paroxysmal positional vertigo, unspecified laterality  THERAPY DIAG:  Dizziness and giddiness  Unsteadiness on feet  Other abnormalities of gait and mobility  Hemiplegia and hemiparesis following cerebral infarction affecting right dominant side (HCC)  ONSET DATE: 08/24/2022  Rationale for Evaluation and Treatment: Rehabilitation  SUBJECTIVE:   SUBJECTIVE STATEMENT: Dizziness is improving. Since doing the vestibular exercises, feels more balanced when standing up.   accompanied by: self   PERTINENT HISTORY: PMH: hx of R hemiplegia due to stroke in 10/22- secondary to L frontal ICH and small SAH- with associated spasticity, anxiety   PAIN:  Are you having pain? No  There were no vitals filed for this visit.    PRECAUTIONS: Fall  WEIGHT BEARING RESTRICTIONS: No  FALLS: Has patient fallen in last 6 months? Yes. Number of falls 1  LIVING ENVIRONMENT: Lives with: lives with their family  and lives with their spouse Lives in: House/apartment Stairs: Yes: Internal: 20 steps; on right going up and on left going up Has following equipment at home: Quad cane small base, SPC with 4 prong tip   PLOF: Independent with basic ADLs, Independent with household mobility with device, and Independent with community mobility with device   PATIENT GOALS: Wants to improve the confidence in her walking, get rid of the dizziness.   OBJECTIVE:   COGNITION: Overall  cognitive status: Within functional limits for tasks assessed  TRANSFERS: Assistive device utilized: None  Sit to stand: CGA Stand to sit: CGA Pt taking incr time to stand up today due to dizziness    VESTIBULAR TREATMENT:                                                                                                   DATE: 09/22/22  NMR:  Gaze Adaptation: x1 Viewing Horizontal: Position: Standing, Time: 30 seconds, Reps: 2, and Comment: cues for continuous head movement x1 Viewing Vertical:  Position: Standing, Time: 30, Reps: 2, and Comment: harder to keep eyes on the X initially, not as symptomatic as horizontal    On air ex: 5 reps sit <> stands EO with no UE support, 2 x 5 reps sit <> stands with EC for immediate standing balance, initially needing UE support > none On rockerboard in A/P direction:  EO: 2 x10 reps head turns, 2 x 10 reps head nods, pt initially more unsteady with head turns, but improved with incr reps  Self ball toss x25 reps with cued to look with head/eyes, cues to toss more with RUE     PATIENT EDUCATION: Education details: Adding more appts (only 2 more and then plan for D/C afterwards) for 1x week for 2 more weeks, progressing VOR x1 to standing at home instead of sitting, answered pt's question about shoes to wear (with pt planning on getting a new pair in future to get new leather toe cap on) and pt asking about minimalist shoes to wear around the house as walking with no shoes feels best (therapist educating that pt can try it but won't be able to wear a foot up brace with these for community ambulation), pt asking about ankle exercises - doing heel toe raises in standing but can't perform R ankle DF in standing (educated pt to continue to perform these and can also work on them in sitting) Person educated: Patient and pt's daughter  Education method: Programmer, multimedia, Demonstration, Verbal cues, and Handouts Education comprehension: verbalized understanding and  returned demonstration  HOME EXERCISE PROGRAM: Seated VOR horizontal and vertical direction x30 seconds  Standing on foam with eyes closed and hip distance x30 seconds  For habituation:   Access Code: [email or web address] URL: [email or web address] Date: 09/01/2022 Prepared by: Sherlie Ban  Program Notes Work on repeated rolling: rolling to the right and waiting for dizziness to go away, then on your back and then roll to the left side. Waiting for dizziness to go away. Perform 4-5 times each side   Exercises - Brandt-Daroff Vestibular Exercise  - 2 x  daily - 5 x weekly - 4-5 reps  GOALS: Goals reviewed with patient? Yes  SHORT TERM GOALS: ALL STGS = LTGS  LONG TERM GOALS: Target date: 09/29/2022  Pt will be independent with final vestibular HEP to decr dizziness.  Baseline:  Goal status: INITIAL  2.  Pt will report items on MSQ as a 1/5 or less in order to demo improved dizziness/decr motion sensitivity.  Baseline: 2-3/5 Goal status: INITIAL  3.  Pt will perform condition 4 of mCTSIB for 30 seconds with normal/mild sway in order to demo improved vestibular input.  Baseline: moderate sway Goal status: INITIAL  4.  Pt will perform DVA in 3 line difference or less in order to demo improved VOR.  Baseline: 5 line difference  Goal status: INITIAL    ASSESSMENT:  CLINICAL IMPRESSION:  Pt still reporting improvements in her dizziness, esp when she stands up. Progressed VOR exercises to standing for HEP and pt reporting not feeling off or dizzy. Addressed pt's questions (see education section above) and worked on balance tasks with unlevel surfaces and head motions. Pt is progressing well, scheduled 2 additional visits, with plan for D/C afterwards. Pt in agreement with this plan.   OBJECTIVE IMPAIRMENTS: Abnormal gait, decreased activity tolerance, decreased balance, decreased coordination, decreased mobility, difficulty walking, and dizziness.   ACTIVITY  LIMITATIONS: carrying, lifting, bending, standing, transfers, bed mobility, locomotion level, and caring for others  PARTICIPATION LIMITATIONS: cleaning, laundry, driving, shopping, and community activity  PERSONAL FACTORS: Age, Behavior pattern, Past/current experiences, Time since onset of injury/illness/exacerbation, and 1-2 comorbidities: hx of CVA, anxiety   are also affecting patient's functional outcome.   REHAB POTENTIAL: Good  CLINICAL DECISION MAKING: Evolving/moderate complexity  EVALUATION COMPLEXITY: Moderate   PLAN:  PT FREQUENCY: 2x/week  PT DURATION: 4 weeks  PLANNED INTERVENTIONS: Therapeutic exercises, Therapeutic activity, Neuromuscular re-education, Balance training, Gait training, Patient/Family education, Self Care, Vestibular training, Canalith repositioning, DME instructions, and Re-evaluation  PLAN FOR NEXT SESSION: check goals, will need re-cert for last 2 visits  Work on motion sensitivities, progress VOR, balance with EC, head motions    Drake Leach, PT, DPT  09/22/2022, 11:13 AM

## 2022-10-03 ENCOUNTER — Ambulatory Visit: Payer: 59 | Admitting: Physical Therapy

## 2022-10-03 ENCOUNTER — Encounter: Payer: Self-pay | Admitting: Physical Therapy

## 2022-10-03 DIAGNOSIS — R2681 Unsteadiness on feet: Secondary | ICD-10-CM

## 2022-10-03 DIAGNOSIS — R42 Dizziness and giddiness: Secondary | ICD-10-CM

## 2022-10-03 NOTE — Therapy (Signed)
OUTPATIENT PHYSICAL THERAPY VESTIBULAR TREATMENT/RE-CERT/DISCHARGE SUMMARY     Patient Name: Felicia Chen MRN: [id number] DOB:1965-11-23, 57 y.o., female Today's Date: 10/03/2022  END OF SESSION:  PT End of Session - 10/03/22 1539     Visit Number 5    Number of Visits 9    Date for PT Re-Evaluation 11/02/22    Authorization Type [employer]    PT Start Time 1536   pt late to session   PT Stop Time 1608   full time not used due to D/C   PT Time Calculation (min) 32 min    Equipment Utilized During Treatment Gait belt    Activity Tolerance Patient tolerated treatment well    Behavior During Therapy WFL for tasks assessed/performed              Past Medical History:  Diagnosis Date   Anxiety    Gilbert's syndrome    Past Surgical History:  Procedure Laterality Date   ABDOMINAL HYSTERECTOMY N/A 06/11/2012   Procedure: HYSTERECTOMY ABDOMINAL;  Surgeon: [name], MD;  Location: [facility];  Service: Gynecology;  Laterality: N/A;   CESAREAN SECTION     x 4   CYSTOSCOPY N/A 06/11/2012   Procedure: CYSTOSCOPY;  Surgeon: [name], MD;  Location: [facility];  Service: Gynecology;  Laterality: N/A;   HERNIA REPAIR     LAPAROSCOPIC ASSISTED VAGINAL HYSTERECTOMY N/A 06/11/2012   Procedure: LAPAROSCOPIC ASSISTED VAGINAL HYSTERECTOMY atttempted;  Surgeon: [name], MD;  Location: [facility];  Service: Gynecology;  Laterality: N/A;  Attempted Laparoscopic assisted vaginal hysterectomy.   LAPAROSCOPIC LYSIS OF ADHESIONS N/A 06/11/2012   Procedure: LAPAROSCOPIC LYSIS OF ADHESIONS;  Surgeon: [name], MD;  Location: [facility];  Service: Gynecology;  Laterality: N/A;   SALPINGOOPHORECTOMY Bilateral 06/11/2012   Procedure: SALPINGO OOPHORECTOMY;  Surgeon: [name], MD;  Location: [facility];  Service: Gynecology;  Laterality: Bilateral;   Patient Active Problem List   Diagnosis Date Noted   Generalized anxiety disorder 05/24/2022   Spasticity as late effect  of cerebrovascular accident (CVA) 10/21/2021   Abnormal gait due to muscle weakness 10/21/2021   Nausea without vomiting    Spastic hemiparesis (HCC)    History of hypertension    Neurogenic orthostatic hypotension (HCC)    Transaminitis    Right hemiplegia (HCC)    Hemorrhagic stroke (HCC)    Leucocytosis    Essential hypertension    Dyslipidemia    Right hemiparesis (HCC)    ICH (intracerebral hemorrhage) (HCC) 12/01/2020   HYPERLIPIDEMIA 07/06/2009   OBESITY 07/06/2009    PCP: [name], PA-C  REFERRING PROVIDER:  Genice Rouge, MD  REFERRING DIAG: H81.10 (ICD-10-CM) - Benign paroxysmal positional vertigo, unspecified laterality  THERAPY DIAG:  Dizziness and giddiness  Unsteadiness on feet  ONSET DATE: 08/24/2022  Rationale for Evaluation and Treatment: Rehabilitation  SUBJECTIVE:   SUBJECTIVE STATEMENT: Dizziness is improving. No falls. Got new toe shoes. Started wearing them today.   accompanied by: self   PERTINENT HISTORY: PMH: hx of R hemiplegia due to stroke in 10/22- secondary to L frontal ICH and small SAH- with associated spasticity, anxiety   PAIN:  Are you having pain? No  There were no vitals filed for this visit.    PRECAUTIONS: Fall  WEIGHT BEARING RESTRICTIONS: No  FALLS: Has patient fallen in last 6 months? Yes. Number of falls 1  LIVING ENVIRONMENT: Lives with: lives with their family and lives with their spouse  Lives in: House/apartment Stairs: Yes: Internal: 20 steps; on right going up and on left going up Has following equipment at home: Quad cane small base, SPC with 4 prong tip   PLOF: Independent with basic ADLs, Independent with household mobility with device, and Independent with community mobility with device   PATIENT GOALS: Wants to improve the confidence in her walking, get rid of the dizziness.   OBJECTIVE:   COGNITION: Overall cognitive status: Within functional limits for tasks  assessed  TRANSFERS: Assistive device utilized: None  Sit to stand: CGA Stand to sit: CGA Pt taking incr time to stand up today due to dizziness    VESTIBULAR TREATMENT:                                                                                                   DATE: 10/03/22  MOTION SENSITIVITY:            Motion Sensitivity Quotient Intensity: 0 = none, 1 = Lightheaded, 2 = Mild, 3 = Moderate, 4 = Severe, 5 = Vomiting   Intensity  1. Sitting to supine .5  2. Supine to L side 0  3. Supine to R side 0  4. Supine to sitting 1  5. L Hallpike-Dix 1  6. Up from L  1  7. R Hallpike-Dix 1  8. Up from R  2  9. Sitting, head tipped to L knee 0  10. Head up from L knee 0  11. Sitting, head tipped to R knee 0  12. Head up from R knee 0  13. Sitting head turns x5 0  14.Sitting head nods x5 0  15. In stance, 180 turn to L  0  16. In stance, 180 turn to R 0  Standing 5 reps head turns: 0 Standing 5 reps head nods: 0  DVA:  Static: Line 10 Dynamic: Line 7 No dizziness afterwards  Condition 4 of mCTSIB: 30 seconds with mild/moderate sway   PATIENT EDUCATION: Education details: Results of goals, continue HEP (from previous POC for balance and current for vestibular), continue working on exercises and walking in the pool, D/C from therapy and pt can return in a few months if needed. Pt verbalized understanding and in agreement with plan  Person educated: Patient and pt's daughter  Education method: Explanation, Demonstration, Verbal cues, and Handouts Education comprehension: verbalized understanding and returned demonstration  HOME EXERCISE PROGRAM: Standing VOR horizontal and vertical direction x30 seconds  Standing on foam with eyes closed and hip distance x30 seconds  For habituation:   Access Code: [email or web address] URL: [email or web address] Date: 09/01/2022 Prepared by: Sherlie Ban  Program Notes Work on repeated rolling: rolling to the right and  waiting for dizziness to go away, then on your back and then roll to the left side. Waiting for dizziness to go away. Perform 4-5 times each side   Exercises - Brandt-Daroff Vestibular Exercise  - 2 x daily - 5 x weekly - 4-5 reps  PHYSICAL THERAPY DISCHARGE SUMMARY  Visits from Start of Care: 5  Current functional level related to goals / functional outcomes:  See LTGs/Clinical Assessment Statement   Remaining deficits: Decr RLE strength, gait abnormalities, impaired balance    Education / Equipment: HEP   Patient agrees to discharge. Patient goals were met. Patient is being discharged due to meeting the stated rehab goals.   GOALS: Goals reviewed with patient? Yes  SHORT TERM GOALS: ALL STGS = LTGS  LONG TERM GOALS: Target date: 09/29/2022  Pt will be independent with final vestibular HEP to decr dizziness.  Baseline:  Goal status: MET  2.  Pt will report items on MSQ as a 1/5 or less in order to demo improved dizziness/decr motion sensitivity.  Baseline: 2-3/5  Only 1 item a 2/5, remainder 0-1/5 (primarily 0) Goal status: MET  3.  Pt will perform condition 4 of mCTSIB for 30 seconds with normal/mild sway in order to demo improved vestibular input.  Baseline: mild/mod sway Goal status: PARTIALLY MET  4.  Pt will perform DVA in 3 line difference or less in order to demo improved VOR.  Baseline: 5 line difference   3 line difference on 10/03/22 Goal status: MET    ASSESSMENT:  CLINICAL IMPRESSION:  Focus of today's skilled session was checking LTGs. Pt has met/partially met all LTGs with pt also having an improvement in her dizziness and balance. Pt with significant improvements in MSQ, indicating improved motion sensitivity and DVA, indicating improved VOR. Reviewed finalized exercise program with pt for vestibular impairments, with pt having no questions at this time. Pt pleased with her progress in therapy with her dizziness and feels her balance is better too. Pt  will be discharged at this time due to progress, pt in agreement with plan.   OBJECTIVE IMPAIRMENTS: Abnormal gait, decreased activity tolerance, decreased balance, decreased coordination, decreased mobility, difficulty walking, and dizziness.   ACTIVITY LIMITATIONS: carrying, lifting, bending, standing, transfers, bed mobility, locomotion level, and caring for others  PARTICIPATION LIMITATIONS: cleaning, laundry, driving, shopping, and community activity  PERSONAL FACTORS: Age, Behavior pattern, Past/current experiences, Time since onset of injury/illness/exacerbation, and 1-2 comorbidities: hx of CVA, anxiety   are also affecting patient's functional outcome.   REHAB POTENTIAL: Good  CLINICAL DECISION MAKING: Evolving/moderate complexity  EVALUATION COMPLEXITY: Moderate   PLAN:  PT FREQUENCY: 2x/week  PT DURATION: 4 weeks  PLANNED INTERVENTIONS: Therapeutic exercises, Therapeutic activity, Neuromuscular re-education, Balance training, Gait training, Patient/Family education, Self Care, Vestibular training, Canalith repositioning, DME instructions, and Re-evaluation  PLAN FOR NEXT SESSION: D/C   Drake Leach, PT, DPT  10/03/2022, 5:12 PM

## 2022-10-04 NOTE — Addendum Note (Signed)
Addended by: Drake Leach on: 10/04/2022 07:48 AM   Modules accepted: Orders

## 2022-10-11 ENCOUNTER — Encounter: Payer: Self-pay | Admitting: Physical Therapy

## 2022-11-27 ENCOUNTER — Encounter: Payer: 59 | Attending: Physical Medicine and Rehabilitation | Admitting: Physical Medicine and Rehabilitation

## 2022-11-27 ENCOUNTER — Encounter: Payer: Self-pay | Admitting: Physical Medicine and Rehabilitation

## 2022-11-27 VITALS — BP 136/81 | HR 75 | Ht 68.0 in | Wt 215.0 lb

## 2022-11-27 DIAGNOSIS — G8191 Hemiplegia, unspecified affecting right dominant side: Secondary | ICD-10-CM

## 2022-11-27 DIAGNOSIS — I612 Nontraumatic intracerebral hemorrhage in hemisphere, unspecified: Secondary | ICD-10-CM

## 2022-11-27 DIAGNOSIS — I619 Nontraumatic intracerebral hemorrhage, unspecified: Secondary | ICD-10-CM

## 2022-11-27 DIAGNOSIS — M6281 Muscle weakness (generalized): Secondary | ICD-10-CM | POA: Insufficient documentation

## 2022-11-27 DIAGNOSIS — F411 Generalized anxiety disorder: Secondary | ICD-10-CM | POA: Diagnosis not present

## 2022-11-27 DIAGNOSIS — R269 Unspecified abnormalities of gait and mobility: Secondary | ICD-10-CM

## 2022-11-27 NOTE — Progress Notes (Signed)
Subjective:    Patient ID: Felicia Chen, female    DOB: [birth year], 57 y.o.   MRN: [id number]  HPI  An audio/video tele-health visit is felt to be the most appropriate encounter for this patient at this time. This is a follow up tele-visit via phone. The patient is at home. MD is at office. Prior to scheduling this appointment, our staff discussed the limitations of evaluation and management by telemedicine and the availability of in-person appointments. The patient expressed understanding and agreed to proceed. Patient has COVID.   Pt is a 57 yr old female with hx of R hemiplegia due to stroke in 10/22- secondary to L frontal ICH and small SAH- with associated spasticity and post STROKE hypotension- was on Florinef and Midodrine in hospital. Off Florinef and prn midodrine.  Here for f/u on spasticity and stroke.    Here on phone due to testing positive for covid.   Feels sick but not horrible  Spasticity worse today. Was real loose yesterday after walking water 2 hours, but tighter today.   Went for vertigo eval- and was fixed- and did all exercises.  Anxiety much worse- mother broke hip and 2 years since had covid and had stroke.  So scared  Went back on Sertraline since anxiety so high-  Doing somewhat better  Anxiety debilitating for her. Physical for her.   Did walk from bathroom and went into home- no shoes/no cane, but worse when wearing shoes/using cane.    Pain Inventory Average Pain 1 Pain Right Now 0 My pain is aching  LOCATION OF PAIN  legs  BOWEL Number of stools per week: 7 Oral laxative use No  Type of laxative . Enema or suppository use No  History of colostomy No  Incontinent No   BLADDER Normal In and out cath, frequency . Able to self cath  . Bladder incontinence No  Frequent urination No  Leakage with coughing No  Difficulty starting stream No  Incomplete bladder emptying No    Mobility use a cane how many minutes can you walk?  30-60 ability to climb steps?  yes do you drive?  yes  Function not employed: date last employed .  Neuro/Psych weakness trouble walking anxiety  Prior Studies Any changes since last visit?  no  Physicians involved in your care Any changes since last visit?  no   No family history on file. Social History   Socioeconomic History   Marital status: Married    Spouse name: Not on file   Number of children: 5   Years of education: Not on file   Highest education level: Not on file  Occupational History   Not on file  Tobacco Use   Smoking status: Never   Smokeless tobacco: Never  Vaping Use   Vaping status: Never Used  Substance and Sexual Activity   Alcohol use: Not Currently    Comment: rarely   Drug use: No   Sexual activity: Yes    Partners: Male  Other Topics Concern   Not on file  Social History Narrative   Not on file   Social Determinants of Health   Financial Resource Strain: Not on file  Food Insecurity: Not on file  Transportation Needs: Not on file  Physical Activity: Not on file  Stress: Not on file  Social Connections: Not on file   Past Surgical History:  Procedure Laterality Date   ABDOMINAL HYSTERECTOMY N/A 06/11/2012   Procedure: HYSTERECTOMY ABDOMINAL;  Surgeon: Fayrene Fearing  Jamal Collin II, MD;  Location: [facility];  Service: Gynecology;  Laterality: N/A;   CESAREAN SECTION     x 4   CYSTOSCOPY N/A 06/11/2012   Procedure: CYSTOSCOPY;  Surgeon: [name], MD;  Location: [facility];  Service: Gynecology;  Laterality: N/A;   HERNIA REPAIR     LAPAROSCOPIC ASSISTED VAGINAL HYSTERECTOMY N/A 06/11/2012   Procedure: LAPAROSCOPIC ASSISTED VAGINAL HYSTERECTOMY atttempted;  Surgeon: [name], MD;  Location: [facility];  Service: Gynecology;  Laterality: N/A;  Attempted Laparoscopic assisted vaginal hysterectomy.   LAPAROSCOPIC LYSIS OF ADHESIONS N/A 06/11/2012   Procedure: LAPAROSCOPIC LYSIS OF ADHESIONS;  Surgeon: [name], MD;  Location:  [facility];  Service: Gynecology;  Laterality: N/A;   SALPINGOOPHORECTOMY Bilateral 06/11/2012   Procedure: SALPINGO OOPHORECTOMY;  Surgeon: [name], MD;  Location: [facility];  Service: Gynecology;  Laterality: Bilateral;   Past Medical History:  Diagnosis Date   Anxiety    Gilbert's syndrome    BP 136/81 Comment: virtual visit, pt states  Pulse 75 Comment: virtual visit, pt states  Ht 5\' 8"  (1.727 m)   Wt 215 lb (97.5 kg)   BMI 32.69 kg/m   Opioid Risk Score:   Fall Risk Score:  `1  Depression screen [facility] 2/9     08/23/2022   10:48 AM 05/24/2022   10:10 AM 10/21/2021    9:26 AM 04/29/2021   11:49 AM 01/24/2021   12:03 PM  Depression screen PHQ 2/9  Decreased Interest 1 0 0 3 0  Down, Depressed, Hopeless 1 0 0 3 2  PHQ - 2 Score 2 0 0 6 2  Altered sleeping     0  Tired, decreased energy     3  Change in appetite     3  Feeling bad or failure about yourself      0  Trouble concentrating     0  Moving slowly or fidgety/restless     0  Suicidal thoughts     0  PHQ-9 Score     8     Review of Systems  Musculoskeletal:  Positive for gait problem.  Neurological:  Positive for weakness.  Psychiatric/Behavioral:  The patient is nervous/anxious.   All other systems reviewed and are negative.     Objective:   Physical Exam   Web ex     Assessment & Plan:   Pt is a 57 yr old female with hx of R hemiplegia due to stroke in 10/22- secondary to L frontal ICH and small SAH- with associated spasticity and post STROKE hypotension- was on Florinef and Midodrine in hospital. Off Florinef and prn midodrine.  Here for f/u on spasticity and stroke.     Educated that spasticity can get worse from COVID, any illness  2. Anxiety is overwhelming for her right now- family trauma- issues- we discussed treating herself well- her "little person".  3. Send me a video of your gait- so I can marvel   4.  F/u in 6 months   I spent a total of 30   minutes on total care today- >50%  coordination of care- due to prolonged d/w about anxiety and function- and spasticity worsening with infection-

## 2023-02-05 ENCOUNTER — Emergency Department (HOSPITAL_COMMUNITY)
Admission: EM | Admit: 2023-02-05 | Discharge: 2023-02-06 | Disposition: A | Discharge status: Home / Self Care | Payer: 59 | Attending: Emergency Medicine | Admitting: Emergency Medicine

## 2023-02-05 ENCOUNTER — Emergency Department (HOSPITAL_COMMUNITY): Payer: 59

## 2023-02-05 ENCOUNTER — Other Ambulatory Visit: Payer: Self-pay

## 2023-02-05 DIAGNOSIS — R000 Tachycardia, unspecified: Secondary | ICD-10-CM | POA: Insufficient documentation

## 2023-02-05 DIAGNOSIS — R569 Unspecified convulsions: Secondary | ICD-10-CM | POA: Insufficient documentation

## 2023-02-05 LAB — COMPREHENSIVE METABOLIC PANEL
ALT: 34 U/L (ref 0–44)
AST: 22 U/L (ref 15–41)
Albumin: 4 g/dL (ref 3.5–5.0)
Alkaline Phosphatase: 58 U/L (ref 38–126)
Anion gap: 10 (ref 5–15)
BUN: 20 mg/dL (ref 6–20)
CO2: 22 mmol/L (ref 22–32)
Calcium: 9.5 mg/dL (ref 8.9–10.3)
Chloride: 107 mmol/L (ref 98–111)
Creatinine, Ser: 0.87 mg/dL (ref 0.44–1.00)
GFR, Estimated: >60 mL/min (ref >60)
Glucose, Bld: 117 mg/dL — ABNORMAL HIGH (ref 70–99)
Potassium: 3.7 mmol/L (ref 3.5–5.1)
Sodium: 139 mmol/L (ref 135–145)
Total Bilirubin: 1.6 mg/dL — ABNORMAL HIGH (ref <1.2)
Total Protein: 6.7 g/dL (ref 6.5–8.1)

## 2023-02-05 LAB — CBC
HCT: 43 % (ref 36.0–46.0)
Hemoglobin: 14.5 g/dL (ref 12.0–15.0)
MCH: 30.4 pg (ref 26.0–34.0)
MCHC: 33.7 g/dL (ref 30.0–36.0)
MCV: 90.1 fL (ref 80.0–100.0)
Platelets: 309 10*3/uL (ref 150–400)
RBC: 4.77 MIL/uL (ref 3.87–5.11)
RDW: 12.8 % (ref 11.5–15.5)
WBC: 11.6 10*3/uL — ABNORMAL HIGH (ref 4.0–10.5)
nRBC: 0 % (ref 0.0–0.2)

## 2023-02-05 LAB — PROTIME-INR
INR: 1 (ref 0.8–1.2)
Prothrombin Time: 13.5 s (ref 11.4–15.2)

## 2023-02-05 LAB — CBG MONITORING, ED: Glucose-Capillary: 132 mg/dL — ABNORMAL HIGH (ref 70–99)

## 2023-02-05 LAB — APTT: aPTT: 26 s (ref 24–36)

## 2023-02-05 NOTE — ED Triage Notes (Signed)
Pt presents to ED 21 via EMS from home for witnessed seizure.  EMS states family reports seizure lasted around 2 minutes.  No history of seizure.  Pt has been under a lot of stress lately; had to put her dog down today and seizure occurred about 20 minutes after.  History of stroke with right-sided deficits.  Pt appears back to mental baseline and is A/O x4 at this time.

## 2023-02-05 NOTE — ED Provider Notes (Signed)
[place] EMERGENCY DEPARTMENT AT [facility] Provider Note   CSN: [id number] Arrival date & time: 02/05/23  1954     History {Add pertinent medical, surgical, social history, OB history to HPI:1} Chief Complaint  Patient presents with   Seizures    Felicia Chen is a 57 y.o. female.  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  Patient states that she has been going through a lot of emotional stress over the last several days.  She has not been sleeping well.  She is on a phone call with family members, when she was observed to have seizure activity.  Her husband witnessed this episode.  He states that she had jerking of her bilateral upper extremities with head turning to the right.  She was not responsive.  Seizure activity lasted approximately 2 minutes.  Self resolved.  Patient was somnolent immediately after.  Once she became more alert, she was confused.  Patient returned to baseline approximately 20 to 25 minutes after seizure-like activity.  Patient denies any recent illnesses.  She has not had any cough, congestion, rhinorrhea.  She has been eating and drinking.  No vomiting or diarrhea.  Patient had no indications that she was going to have a seizure.  Has never had a seizure in the past.  Although, does note that she has had other family numbers who have had seizures.  The history is provided by the patient and the spouse.  Seizures      Home Medications Prior to Admission medications   Medication Sig Start Date End Date Taking? Authorizing Provider  acetaminophen (TYLENOL) 325 MG tablet Take 2 tablets (650 mg total) by mouth every 4 (four) hours as needed for mild pain (or temp > 37.5 C (99.5 F)). 01/04/21   Angiulli, Mcarthur Rossetti, PA-C  ALPRAZolam Prudy Feeler) 0.5 MG tablet Take 0.5 mg by mouth 2 (two) times daily as needed. 05/12/22   [provider]  atorvastatin (LIPITOR) 80 MG tablet 1 tablet Orally Once a day    [provider]  baclofen (LIORESAL) 10 MG tablet TAKE 1 TABLET BY MOUTH AT BEDTIME 06/20/22   Lovorn, Megan, MD  COVID-19 mRNA bivalent vaccine, Moderna, (MODERNA COVID-19 BIVAL BOOSTER) 50 MCG/0.5ML injection Inject into the muscle. 02/17/21   Judyann Munson, MD  gabapentin (NEURONTIN) 100 MG capsule Take 200 mg by mouth at bedtime. 03/26/22   [provider]  gabapentin (NEURONTIN) 300 MG capsule Take 300 mg by mouth 2 (two) times daily. 06/28/21   [provider]  meclizine (ANTIVERT) 12.5 MG tablet Take 1 tablet (12.5 mg total) by mouth 3 (three) times daily as needed for dizziness. 08/23/22   Lovorn, Aundra Millet, MD  TRINTELLIX 20 MG TABS tablet Take 20 mg by mouth daily. 04/26/22   [provider]  zolpidem (AMBIEN) 10 MG tablet 1 tablet at bedtime as needed Orally Once a day 04/18/21   [provider]      Allergies    Patient has no known allergies.    Review of Systems   As noted in HPI  Physical Exam Updated Vital Signs BP 124/82 (BP Location: Left Arm)   Pulse (!) 103   Temp 98.6 F (37 C) (Oral)   Resp 19   Ht 5\' 8"  (1.727 m)   Wt 97.5 kg   SpO2 96%   BMI 32.68 kg/m  Physical Exam Vitals reviewed.  Constitutional:      General: She is not in acute distress.    Appearance: Normal appearance. She is not ill-appearing, toxic-appearing or diaphoretic.  HENT:     Head: Normocephalic.     Nose: Nose normal.     Mouth/Throat:     Mouth: Mucous membranes are moist.     Pharynx: Oropharynx is clear.  Eyes:     General: No visual field deficit.    Extraocular Movements: Extraocular movements intact.     Conjunctiva/sclera: Conjunctivae normal.     Pupils: Pupils are equal, round, and reactive to light.  Cardiovascular:     Rate and Rhythm: Normal rate and regular rhythm.     Pulses: Normal pulses.          Radial pulses are 2+ on the right side and 2+ on the left side.        Dorsalis pedis pulses are 2+ on the right side and 2+ on the left side.     Heart sounds: Normal heart sounds. No murmur heard.    No friction rub. No gallop.  Pulmonary:     Effort: Pulmonary effort is normal. No respiratory distress.     Breath sounds: Normal breath sounds. No wheezing, rhonchi or rales.  Abdominal:     General: There is no distension.     Palpations: Abdomen is soft.     Tenderness: There is no abdominal tenderness. There is no guarding or rebound.  Musculoskeletal:     Right lower leg: No edema.     Left lower leg: No edema.  Skin:    General: Skin is warm and dry.  Neurological:     Mental Status: She is alert and oriented to person, place, and time.     GCS: GCS eye subscore is 4. GCS verbal subscore is 5. GCS motor subscore is 6.     Cranial Nerves: Cranial nerves 2-12 are intact. No cranial nerve deficit, dysarthria or facial asymmetry.     Sensory: Sensation is intact. No sensory deficit.     Motor: Pronator drift (On right) present. No weakness.     Coordination: Romberg sign positive. Finger-Nose-Finger Test normal.     ED Results / Procedures / Treatments   Labs (all labs ordered are listed, but only abnormal results are displayed) Labs Reviewed  COMPREHENSIVE METABOLIC PANEL - Abnormal; Notable for the following components:      Result Value   Glucose, Bld 117 (*)    Total Bilirubin 1.6 (*)    All other components within normal limits  CBC - Abnormal; Notable for the following components:   WBC 11.6 (*)    All other components within normal limits  CBG MONITORING, ED - Abnormal; Notable for the following components:   Glucose-Capillary 132 (*)    All other components within normal limits  POC URINE PREG, ED    EKG EKG Interpretation Date/Time:  Monday February 05 2023 21:34:40 EST Ventricular Rate:  101 PR Interval:  144 QRS Duration:  102 QT Interval:  360 QTC Calculation: 467 R Axis:   29  Text Interpretation: Sinus  tachycardia Borderline repolarization abnormality No acute changes Confirmed by Derwood Kaplan [phone]) on 02/05/2023 10:39:14 PM  Radiology CT Head Wo Contrast  Result Date: 02/05/2023 CLINICAL DATA:  Seizure EXAM: CT HEAD WITHOUT CONTRAST TECHNIQUE: Contiguous axial images were obtained from the base of the skull through the vertex without intravenous contrast. RADIATION DOSE REDUCTION: This exam was performed according to the departmental dose-optimization  program which includes automated exposure control, adjustment of the mA and/or kV according to patient size and/or use of iterative reconstruction technique. COMPARISON:  05/23/2021 FINDINGS: Brain: Possible trace hyperdense hemorrhage along the left occipital horn, adjacent to the area of encephalomalacia and gliosis in the left frontal and parietal region (series 4, images 21-22), new from the prior exam. No evidence of acute infarct, mass, mass effect, or midline shift. No hydrocephalus or additional extra-axial collection. Vascular: No hyperdense vessel. Skull: Negative for fracture or focal lesion. Sinuses/Orbits: Mucosal thickening in the right maxillary sinus. No acute finding in the orbits. Other: The mastoid air cells are well aerated. IMPRESSION: Possible trace hyperdense hemorrhage along the left occipital horn, adjacent to the area of encephalomalacia and gliosis in the left frontal and parietal region, new from the prior exam. Consider MRI for further evaluation if clinically indicated. These results were called by telephone at the time of interpretation on 02/05/2023 at 10:40 pm to provider [facility] , who verbally acknowledged these results. Electronically Signed   By: Wiliam Ke M.D.   On: 02/05/2023 22:41    Procedures Procedures  {Document cardiac monitor, telemetry assessment procedure when appropriate:1}  Medications Ordered in ED Medications - No data to display  ED Course/ Medical Decision Making/ A&P Clinical  Course as of 02/05/23 2313  Mon Feb 05, 2023  2310 EKG 12-Lead Sinus rhythm.  Rate of 101.  Prolonged QRS of 102 ms.  No axis deviation.  Nonspecific T wave abnormalities in 1, aVL, V1-V2.  No ST segment changes.  Nonischemic ECG. [JR]    Clinical Course User Index [JR] Rolla Flatten, MD   {   Click here for ABCD2, HEART and other calculatorsREFRESH Note before signing :1}                              Medical Decision Making 57 year old female presents here for seizure-like activity.  Patient slightly tachycardic on presentation.  Vitals otherwise reassuring.  On exam, patient awake, alert.  Answering questions appropriately.  Neurologic exam with right upper extremity pronator drift and positive Romberg.  However, she states that imbalance has been an issue for her recently due to vertigo.    Initial differential diagnosis includes spontaneous intracranial hemorrhage, electrolyte abnormality, hypoglycemia, arrhythmia, nonepileptic seizures  Patient has no prior history of seizures.  Therefore, we will get screening CT head without contrast given her history of hemorrhagic stroke.  Will also get labs to evaluate for electrolyte abnormality, anemia, coagulopathy.  I independently reviewed the patient's CT imaging, which is concerning for spontaneous intracranial hemorrhage.  No significant electrolyte abnormalities noted on labs.  Neurology consulted for management recommendations given findings concerning for ICH.  Amount and/or Complexity of Data Reviewed Labs: ordered. Radiology: ordered.   ***  {Document critical care time when appropriate:1} {Document review of labs and clinical decision tools ie heart score, Chads2Vasc2 etc:1}  {Document your independent review of radiology images, and any outside records:1} {Document your discussion with family members, caretakers, and with consultants:1} {Document social determinants of health affecting pt's care:1} {Document your decision  making why or why not admission, treatments were needed:1} Final Clinical Impression(s) / ED Diagnoses Final diagnoses:  None    Rx / DC Orders ED Discharge Orders     None

## 2023-02-06 ENCOUNTER — Emergency Department (HOSPITAL_COMMUNITY): Payer: 59

## 2023-02-06 MED ORDER — LEVETIRACETAM 500 MG PO TABS: 500.0000 mg | ORAL_TABLET | Freq: Two times a day (BID) | ORAL | 2 refills | Status: DC

## 2023-02-06 MED ORDER — LEVETIRACETAM IN NACL 1000 MG/100ML IV SOLN
1000.0000 mg | Freq: Once | INTRAVENOUS | Status: AC
  Administered 2023-02-06: 1000 mg via INTRAVENOUS
  Filled 2023-02-06: qty 100

## 2023-02-06 MED ORDER — LORAZEPAM 1 MG PO TABS
0.5000 mg | ORAL_TABLET | Freq: Once | ORAL | Status: AC
Start: 2023-02-06 — End: 2023-02-06
  Administered 2023-02-06: 0.5 mg via ORAL
  Filled 2023-02-06: qty 1

## 2023-02-06 NOTE — ED Provider Notes (Signed)
Physical Exam  BP 120/69   Pulse 99   Temp 98.6 F (37 C) (Oral)   Resp 19   Ht 5\' 8"  (1.727 m)   Wt 97.5 kg   SpO2 93%   BMI 32.68 kg/m   Physical Exam Vitals and nursing note reviewed.  Constitutional:      General: She is not in acute distress.    Appearance: She is not toxic-appearing.  HENT:     Mouth/Throat:     Mouth: Mucous membranes are moist.  Eyes:     General: No scleral icterus. Pulmonary:     Effort: Pulmonary effort is normal. No respiratory distress.  Skin:    General: Skin is warm and dry.  Neurological:     Mental Status: She is alert.     Procedures  Procedures  ED Course / MDM   Clinical Course as of 02/06/23 0235  Mon Feb 05, 2023  2310 EKG 12-Lead Sinus rhythm.  Rate of 101.  Prolonged QRS of 102 ms.  No axis deviation.  Nonspecific T wave abnormalities in 1, aVL, V1-V2.  No ST segment changes.  Nonischemic ECG. [JR]    Clinical Course User Index [JR] Rolla Flatten, MD   Medical Decision Making Amount and/or Complexity of Data Reviewed Labs: ordered. Radiology: ordered. ECG/medicine tests:  Decision-making details documented in ED Course.  Risk Prescription drug management.   Accepted handoff at shift change from Rolla Flatten, MD. Please see prior provider note for more detail.   Briefly: Patient is 57 y.o. F presenting to the ER for evaluation of new onset seizure. Patient is back to baseline now. CT read shows possible new bleed versus calcification of previous CVA. Recommending MRI imaging.   Plan: MRI  Dr. Amada Jupiter with neurology was consulted and recommended giving 1000mg  of Keppra IV. Also recommending MRI for further clarification of the CT imaging.   MRI shows 1. No acute intracranial process. 2. No definite acute hemorrhage is seen to correlate with the hyperdensity noted on the 02/05/2023 CT, which may have represented mineralization from more remote hemorrhage. Per radiologist's interpretation.    I consulted  neurology again. Recommends Keppra 500mg  BID and follow up with neurology. Will give seizure precautions and driving as well.   At bedside, patient's husband reports that she is at her baseline. Has not had any seizure like activity since.  We discussed driving restrictions as well as things to avoid while seizures, like samples, etc.  Patient reports that she has not been sleeping well due to tending to her ill dog as well as putting the dog down today.  She also reports that she is not doing a lot of stressors at home as her 49 year old mother is also been living with her since she recently broke her hip.  The patient reports that she does not have any new or worsening symptoms.  She is already given the Keppra IV.  Discussed with her about the new medication Keppra 5 and milligrams twice daily.  She was recently seen by [facility] neurology after her first stroke.  Will have her refollow-up with them continuing for workup for seizures outpatient.  We discussed return precautions or back symptoms.  Patient and family verbalized understanding agree with the plan.  Patient is stable being discharged home and to condition.  Results for orders placed or performed during the hospital encounter of 02/05/23  Comprehensive metabolic panel  Result Value Ref Range   Sodium 139 135 - 145 mmol/L   Potassium  3.7 3.5 - 5.1 mmol/L   Chloride 107 98 - 111 mmol/L   CO2 22 22 - 32 mmol/L   Glucose, Bld 117 (H) 70 - 99 mg/dL   BUN 20 6 - 20 mg/dL   Creatinine, Ser 9.14 0.44 - 1.00 mg/dL   Calcium 9.5 8.9 - 78.2 mg/dL   Total Protein 6.7 6.5 - 8.1 g/dL   Albumin 4.0 3.5 - 5.0 g/dL   AST 22 15 - 41 U/L   ALT 34 0 - 44 U/L   Alkaline Phosphatase 58 38 - 126 U/L   Total Bilirubin 1.6 (H) <1.2 mg/dL   GFR, Estimated >95 >62 mL/min   Anion gap 10 5 - 15  CBC  Result Value Ref Range   WBC 11.6 (H) 4.0 - 10.5 K/uL   RBC 4.77 3.87 - 5.11 MIL/uL   Hemoglobin 14.5 12.0 - 15.0 g/dL   HCT 13.0 86.5 - 78.4 %   MCV 90.1  80.0 - 100.0 fL   MCH 30.4 26.0 - 34.0 pg   MCHC 33.7 30.0 - 36.0 g/dL   RDW 69.6 29.5 - 28.4 %   Platelets 309 150 - 400 K/uL   nRBC 0.0 0.0 - 0.2 %  Protime-INR  Result Value Ref Range   Prothrombin Time 13.5 11.4 - 15.2 seconds   INR 1.0 0.8 - 1.2  APTT  Result Value Ref Range   aPTT 26 24 - 36 seconds  POC CBG, ED  Result Value Ref Range   Glucose-Capillary 132 (H) 70 - 99 mg/dL   MR BRAIN WO CONTRAST  Result Date: 02/06/2023 CLINICAL DATA:  Seizure, no history of trauma EXAM: MRI HEAD WITHOUT CONTRAST TECHNIQUE: Multiplanar, multiecho pulse sequences of the brain and surrounding structures were obtained without intravenous contrast. COMPARISON:  03/11/2021 MRI head, correlation is also made with 02/05/2023 CT head FINDINGS: Brain: Redemonstrated sequela of prior left parietal parenchymal hematoma, with encephalomalacia, gliosis, and hemosiderin deposition in the left parietal and posterior frontal lobe. No definite acute hemorrhage is seen in this area to correlate with the hyperdensity noted on the 02/05/2023 CT, which may have represented mineralization from more remote hemorrhage. No evidence of acute infarct, mass, mass effect, or midline shift. No hydrocephalus or extra-axial collection. Pituitary and craniocervical junction within normal limits. Vascular: Normal arterial flow voids. Skull and upper cervical spine: Normal marrow signal. Sinuses/Orbits: Mucosal thickening in the right maxillary sinus. No acute finding in the orbits. Other: The mastoids are well aerated. IMPRESSION: 1. No acute intracranial process. 2. No definite acute hemorrhage is seen to correlate with the hyperdensity noted on the 02/05/2023 CT, which may have represented mineralization from more remote hemorrhage. Electronically Signed   By: Wiliam Ke M.D.   On: 02/06/2023 02:42   CT Head Wo Contrast  Result Date: 02/05/2023 CLINICAL DATA:  Seizure EXAM: CT HEAD WITHOUT CONTRAST TECHNIQUE: Contiguous axial  images were obtained from the base of the skull through the vertex without intravenous contrast. RADIATION DOSE REDUCTION: This exam was performed according to the departmental dose-optimization program which includes automated exposure control, adjustment of the mA and/or kV according to patient size and/or use of iterative reconstruction technique. COMPARISON:  05/23/2021 FINDINGS: Brain: Possible trace hyperdense hemorrhage along the left occipital horn, adjacent to the area of encephalomalacia and gliosis in the left frontal and parietal region (series 4, images 21-22), new from the prior exam. No evidence of acute infarct, mass, mass effect, or midline shift. No hydrocephalus or additional extra-axial collection. Vascular:  No hyperdense vessel. Skull: Negative for fracture or focal lesion. Sinuses/Orbits: Mucosal thickening in the right maxillary sinus. No acute finding in the orbits. Other: The mastoid air cells are well aerated. IMPRESSION: Possible trace hyperdense hemorrhage along the left occipital horn, adjacent to the area of encephalomalacia and gliosis in the left frontal and parietal region, new from the prior exam. Consider MRI for further evaluation if clinically indicated. These results were called by telephone at the time of interpretation on 02/05/2023 at 10:40 pm to provider [facility] , who verbally acknowledged these results. Electronically Signed   By: Wiliam Ke M.D.   On: 02/05/2023 22:41         Achille Rich, PA-C 02/06/23 4742    Gilda Crease, MD 02/07/23 [phone]

## 2023-02-06 NOTE — ED Notes (Signed)
Pt endorsing nausea upon return from MRI.  This RN to notify provider of same.

## 2023-02-06 NOTE — Discharge Instructions (Addendum)
You were seen it the ER for evaluation of your seizure. I am starting you on a medication to help with these called Keppra. Please take twice a day. Additionally, I have included the information for the neurologist for you to call to schedule an appointment with.  Please call to schedule an appointment.  I do not advise swimming or driving.  Included more information on the [facility] statutes for seizure-like activity.  Additionally, I have also included more information on seizures into the discharge paperwork.  Please review.  If you have any concerns, new or worsening symptoms, please return to your nearest emergency department for reevaluation.  Department of Motor Vehicle [facility]) of [place] regulations for seizures - It is the patient's responsibility to report the incidence of the seizure in the state of [place]. [place] has no statutory provision requiring physicians to report patients diagnosed with epilepsy or seizures to a central state agency.  The recommended DMV regulation requirement for a driver in [place] for an individual with a seizure is that they be seizure-free for 6-12 months. However, the DMV may consider the following exceptions to this general rule where: (1) a physician-directed change in medication causes a seizure and the individual immediately resumes the previous therapy which controlled seizures; (2) there is a history of nocturnal seizures or seizures which do not involve loss of consciousness, loss of control of motor function, or loss of appropriate sensation and information process; and (3) an individual has a seizure disorder preceded by an aura (warning) lasting 2-3 minutes. While the [facility] may also give consideration to other unusual circumstances which may affect the general requirement that drivers be seizure-free for 6-12 months, interpretation of these circumstances and assignment of restrictions is at the discretion of the Medical Advisor. The DMV also considers compliance with medical  therapy essential for safe driving. [facility] [place] Physician's Guide to [employer] (June, 1995 ed.)] The Department learns of an individual's condition by inquiring on the application form or renewal form, a physician's report to the [facility], an accident report or from correspondence from the individual. The person may be required to submit a Medical Report Form either annually or semi-annually.  Contact a doctor if: You have another seizure or seizures. Call the doctor each time you have a seizure. The pattern of your seizures changes. You keep having seizures with treatment. You have symptoms of being sick or having an infection. You are not able to take your medicine. Get help right away if: You have any of these problems: A seizure that lasts longer than 5 minutes. Many seizures in a row and you do not feel better between seizures. A seizure that makes it harder to breathe. A seizure and you can no longer speak or use part of your body. You do not wake up right after a seizure. You get hurt during a seizure. You feel confused or have pain right after a seizure. These symptoms may be an emergency. Get help right away. Call your local emergency services (911 in the [place] Do not wait to see if the symptoms will go away. Do not drive yourself to the hospital.

## 2023-02-26 ENCOUNTER — Encounter (INDEPENDENT_AMBULATORY_CARE_PROVIDER_SITE_OTHER): Payer: Self-pay | Admitting: Otolaryngology

## 2023-03-01 ENCOUNTER — Telehealth: Payer: Self-pay | Admitting: Adult Health

## 2023-03-01 NOTE — Telephone Encounter (Signed)
Pt inquired about making an appointment

## 2023-03-27 ENCOUNTER — Ambulatory Visit (INDEPENDENT_AMBULATORY_CARE_PROVIDER_SITE_OTHER): Payer: 59 | Admitting: Neurology

## 2023-03-27 ENCOUNTER — Encounter: Payer: Self-pay | Admitting: Neurology

## 2023-03-27 VITALS — BP 116/82 | HR 88 | Ht 68.0 in | Wt 233.0 lb

## 2023-03-27 DIAGNOSIS — G40209 Localization-related (focal) (partial) symptomatic epilepsy and epileptic syndromes with complex partial seizures, not intractable, without status epilepticus: Secondary | ICD-10-CM | POA: Diagnosis not present

## 2023-03-27 DIAGNOSIS — H8110 Benign paroxysmal vertigo, unspecified ear: Secondary | ICD-10-CM

## 2023-03-27 MED ORDER — LEVETIRACETAM 500 MG PO TABS: 500.0000 mg | ORAL_TABLET | Freq: Two times a day (BID) | ORAL | 2 refills | Status: DC

## 2023-03-27 NOTE — Progress Notes (Signed)
 [facility] [address] [place]. [place] [address] [phone]       OFFICE CONSULT NOTE  Felicia Chen Date of Birth:  1965/04/20 Medical Record Number:  [id number]   Referring MD: Lonni Gutter  Reason for Referral: Seizure  HPI: Felicia Chen is a 58 year old pleasant Caucasian lady seen today for office consultation visit for seizure.  She is accompanied by 2 of her daughters.  History is obtained from them and review of electronic medical records.  I personally reviewed pertinent available imaging films in PACS.  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  She presented to emergency room on 02/05/2023 with witnessed seizure activity by family.  She had been undergoing through a lot of stressful.  She unfortunately had just put down her dog that day..  She started developing jerking and left leg subsequently it spread to involve all 4 extremities and she was unresponsive for 2 minutes and seizure activity resolved.  She was subsequently somnolent and confused and returned to her baseline in about 2025 minutes.  Patient was given Keppra  1 gram loading dose and subsequently discharged on 500 mg twice daily.  MRI scan of the brain was obtained which showed no acute abnormality.  Comprehensive metabolic panel labs and CBC were fairly unremarkable.  Patient is tolerating Keppra  well without any significant side effects of breakthrough seizures.  However she is states that she has had 2 minor falls and head the left side of her head and which has led to relapse of her prior benign paroxysmal positional vertigo.  She had obtained good benefit with vestibular stabilization exercises when she had been referred to physical therapy in the past for this.  She denies any prior history of seizures.  There is no  family history of seizures. She has past neurological history of left frontal parenchymal hemorrhage of indeterminate etiology-hypertension versus RCVS versus small vessel disease following COVID infection.  Follow-up MRI scan of the brain on 03/11/2021 showed stable expected evolutionary changes without any underlying mass lesion.  She was seen in office last by Felicia Chen on 02/08/2021. ROS:   14 system review of systems is positive for anxiety, tremors, dizziness, vertigo, gait difficulty, seizures all other systems negative  PMH:  Past Medical History:  Diagnosis Date   Anxiety    Gilbert's syndrome     Social History:  Social History   Socioeconomic History   Marital status: Married    Spouse name: Not on file   Number of children: 5   Years of education: Not on file   Highest education level: Not on file  Occupational History   Not on file  Tobacco Use   Smoking status: Never   Smokeless tobacco: Never  Vaping Use   Vaping status: Never Used  Substance and Sexual Activity   Alcohol use: Not Currently    Comment: rarely   Drug use: No   Sexual activity: Yes    Partners: Male  Other Topics Concern   Not on file  Social History Narrative   Not on file   Social Drivers of Health   Financial Resource Strain: Not on file  Food Insecurity: Not on file  Transportation Needs: Not on file  Physical Activity: Not on file  Stress: Not on file  Social Connections: Not on file  Intimate Partner Violence: Not on file  Medications:   Current Outpatient Medications on File Prior to Visit  Medication Sig Dispense Refill   ALPRAZolam  (XANAX ) 0.5 MG tablet Take 0.5 mg by mouth 2 (two) times daily as needed.     atorvastatin  (LIPITOR ) 80 MG tablet 1 tablet Orally Once a day     baclofen  (LIORESAL ) 10 MG tablet TAKE 1 TABLET BY MOUTH AT BEDTIME 90 tablet 1   gabapentin (NEURONTIN) 100 MG capsule Take 200 mg by mouth at bedtime. 1 per day     levETIRAcetam   (KEPPRA ) 500 MG tablet Take 1 tablet (500 mg total) by mouth 2 (two) times daily. 60 tablet 2   sertraline  (ZOLOFT ) 100 MG tablet Take 100 mg by mouth in the morning and at bedtime.     acetaminophen  (TYLENOL ) 325 MG tablet Take 2 tablets (650 mg total) by mouth every 4 (four) hours as needed for mild pain (or temp > 37.5 C (99.5 F)). (Patient not taking: Reported on 03/27/2023)     COVID-19 mRNA bivalent vaccine, Moderna, (MODERNA COVID-19 BIVAL BOOSTER) 50 MCG/0.5ML injection Inject into the muscle. (Patient not taking: Reported on 03/27/2023) 0.5 mL 0   gabapentin (NEURONTIN) 300 MG capsule Take 300 mg by mouth 2 (two) times daily. (Patient not taking: Reported on 03/27/2023)     meclizine  (ANTIVERT ) 12.5 MG tablet Take 1 tablet (12.5 mg total) by mouth 3 (three) times daily as needed for dizziness. (Patient not taking: Reported on 03/27/2023) 90 tablet 1   TRINTELLIX 20 MG TABS tablet Take 20 mg by mouth daily. (Patient not taking: Reported on 03/27/2023)     zolpidem  (AMBIEN ) 10 MG tablet 1 tablet at bedtime as needed Orally Once a day (Patient not taking: Reported on 03/27/2023)     No current facility-administered medications on file prior to visit.    Allergies:  No Known Allergies  Physical Exam General: well developed, well nourished, seated, in no evident distress except appears quite anxious. Head: head normocephalic and atraumatic.   Neck: supple with no carotid or supraclavicular bruits Cardiovascular: regular rate and rhythm, no murmurs Musculoskeletal: no deformity Skin:  no rash/petichiae Vascular:  Normal pulses all extremities  Neurologic Exam Mental Status: Awake and fully alert. Oriented to place and time. Recent and remote memory intact. Attention span, concentration and fund of knowledge appropriate. Mood and affect appropriate.  Cranial Nerves: Fundoscopic exam reveals sharp disc margins. Pupils equal, briskly reactive to light. Extraocular movements full without nystagmus.  Visual fields full to confrontation. Hearing intact. Facial sensation intact. Face, tongue, palate moves normally and symmetrically.  Motor: Normal bulk and tone. Normal strength in all tested extremity muscles except mild 4+/5 strength on the right with slight weakness of right grip.  Diminished fine finger movements on the right.  Orbits left or right upper extremity.  Slight increase in tone on the right..  Mild bilateral pain action tremor likely from anxiety. Sensory.: intact to touch , pinprick , position and vibratory sensation.  Coordination: Rapid alternating movements normal in all extremities. Finger-to-nose and heel-to-shin performed accurately bilaterally. Gait and Station: Sitting in a wheelchair.  She is too scared to get up and walk due to her dizziness. Reflexes: 1+ and symmetric. Toes downgoing.      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  She also has a history of benign paroxysmal positional vertigo with recent flareup following minor head trauma.  She has underlying gait  difficulties likely due to significant underlying anxiety.     PLAN:I had a long discussion with the patient and her 2 daughters regarding her new onset of seizure which is likely symptomatic from her remote history of intracerebral hemorrhage.  I recommend she continue Keppra  500  mg twice daily which she is tolerating well and check EEG.  She was advised to avoid seizure degrading factors like sleep deprivation, extremes of exertion and activity and stimulants like alcohol and street drugs.  She was advised not to drive for 6 months since her seizure as per [place]  law.  She also has had a flareup of her benign positional vertigo following her recent head injuries and will refer her to physical therapy for vestibular stabilization exercises.  Continue management of her  anxiety as per primary care physician.  Return for follow-up in the future with Felicia Chen, nurse Chen in 3 months or call earlier if necessary.I had a long discussion with the patient and her 2 daughters regarding her new onset of seizure which is likely symptomatic from her remote history of intracerebral hemorrhage.  I recommend she continue Keppra  500  mg twice daily which she is tolerating well and check EEG.  She was advised to avoid seizure provoking factors like sleep deprivation, extremes of exertion and activity and stimulants like alcohol and street drugs.  She was advised not to drive for 6 months since her seizure as per [place]  law.  She also has had a flareup of her benign positional vertigo following her recent head injuries and will refer her to physical therapy for vestibular stabilization exercises.  Continue Zoloft  and management of her anxiety as per primary care physician.  Return for follow-up in the future with Felicia Chen, nurse Chen in 3 months or call earlier if necessary. Greater than 50% time during this 45-minute consultation visit was spent in counseling and coordination of care about her remote intracerebral hemorrhage, new onset seizure and flareup of her vertigo and anxiety and answering questions Felicia Popp, MD Note: This document was prepared with digital dictation and possible smart phrase technology. Any transcriptional errors that result from this process are unintentional.

## 2023-03-27 NOTE — Patient Instructions (Signed)
 I had a long discussion with the patient and her 2 daughters regarding her new onset of seizure which is likely symptomatic from her remote history of intracerebral hemorrhage.  I recommend she continue Keppra  500  mg twice daily which she is tolerating well and check EEG.  She was advised to avoid seizure degrading factors like sleep deprivation, extremes of exertion and activity and stimulants like alcohol and street drugs.  She was advised not to drive for 6 months since her seizure as per [place]  law.  She also has had a flareup of her benign positional vertigo following her recent head injuries and will refer her to physical therapy for vestibular stabilization exercises.  Continue management of her anxiety as per primary care physician.  Return for follow-up in the future with Harlene, nurse practitioner in 3 months or call earlier if necessary.  Seizure, Adult A seizure is a sudden burst of abnormal activity in the brain. Seizures usually last from 30 seconds to 2 minutes. There are many types of seizures. And they can cause many different symptoms. What are the causes? Common causes of a seizure include: Fever or infection. Problems that affect the brain. These may include: A brain or head injury. A stroke. A brain tumor. Low levels of blood sugar or salt. Kidney problems or liver problems. Some inherited conditions. These are passed down from parent to child. Problems with a substance, such as: Having a reaction to a drug or a medicine. Stopping the use of a substance all of a sudden. When this causes problems, it's called withdrawal. Disorders that affect how you develop, such as autism spectrum disorder or cerebral palsy. Sometimes, the cause may not be known. Some people who have a seizure never have another one. A person who has repeated seizures over time without a clear cause has a condition called epilepsy. What increases the risk? Having a family history of  epilepsy. Having had a tonic-clonic seizure before. This type of seizure causes: The muscles of the whole body to tighten, or contract. Loss of consciousness. Having a head injury or a stroke in the past. Having had too little oxygen at birth. What are the signs or symptoms? The symptoms vary depending on the type of seizure you have. Symptoms during a seizure Having convulsions. This means shaking with fast, jerky movements of muscles. Stiffness of the body. Breathing problems. Being confused. Staring or not responding to sound or touch. Head nodding, eye blinking, eye twitching, or fast eye movements. Drooling, grunting, or making clicking sounds with your mouth. Losing control of when you pee or poop. Symptoms before a seizure Feeling afraid, worried, or nervous. Feeling like you may vomit. Vertigo. This feels like: You are moving when you're not. Things around you are moving when they're not. Dj vu. This is a feeling of having seen or heard something before. Odd tastes or smells. Changes in how you see. You may see flashing lights or spots. Symptoms after a seizure Being confused. Feeling sleepy. Headache. Sore muscles. How is this diagnosed? A seizure may be diagnosed based on: A description of your symptoms. Video of your seizures can be helpful. Your medical history. A physical exam. Tests, such as: Blood tests. CT scan. MRI. Electroencephalogram, or EEG. This test measures electrical activity in the brain. A test of your spinal fluid. This is called a spinal tap or lumbar puncture. How is this treated? If your seizure stops on its own, you will not need treatment. If your seizure  lasts longer than 5 minutes, you'll normally need treatment. This may include: Medicines given through an IV. Avoiding things, such as medicines, that are known to cause your seizures. Medicines to prevent seizures. These are called antiepileptics. A device to prevent or control  seizures. Eating foods that are low in carbohydrates and high in fat (ketogenic diet). Surgery. This is sometimes needed if you keep having seizures. Follow these instructions at home: Medicines Take your medicines only as told by your health care provider. Avoid anything that may keep your medicine from working, such as alcohol. Activity Follow your provider's advice about driving, swimming, and doing other things that would be dangerous if you had a seizure. Wait until your provider says it's safe for you to do these things. If you live in the [place] ask your local department of motor vehicles [facility]) when you can drive. Get enough rest and sleep. Not getting enough sleep can make seizures more likely to happen. Teaching others  Teach friends and family what to do if you have a seizure. Tell them to: Help you get down to the ground safely. Protect your head and body. Loosen any clothing around your neck. Turn you on your side. This helps keep your airway clear if you vomit. Know whether or not you need emergency care. Stay with you until you are better. Also, tell them what not to do if you have a seizure. Tell them: They should not hold you down. They should not put anything in your mouth. General instructions Avoid anything that has caused you to have seizures. Keep a seizure diary. Write down: What you remember about each seizure. What you think might have caused each seizure. Keep all follow-up visits. Your provider may need to monitor your progress. Contact a health care provider if: You have another seizure or seizures. Call each time you have a seizure. You have a change in how often or when you have seizures. You keep having seizures with treatment. You have symptoms of being sick or having an infection. You are not able to take your medicine. Get help right away if: You have or someone has seen you have: A seizure that lasts longer than 5 minutes. Many seizures in a row  and you don't feel better between seizures. A seizure that makes it harder to breathe. A seizure that leaves you unable to speak or use a part of your body. You didn't wake up right away after a seizure. You injure yourself during a seizure. You have confusion or pain right after a seizure. These symptoms may be an emergency. Call 911 right away. Do not wait to see if the symptoms will go away. Do not drive yourself to the hospital. This information is not intended to replace advice given to you by your health care provider. Make sure you discuss any questions you have with your health care provider. Document Revised: 04/19/2022 Document Reviewed: 04/19/2022 Elsevier Patient Education  2024 [employer].

## 2023-04-04 ENCOUNTER — Ambulatory Visit: Payer: 59 | Attending: Neurology | Admitting: Physical Therapy

## 2023-04-04 DIAGNOSIS — R29818 Other symptoms and signs involving the nervous system: Secondary | ICD-10-CM | POA: Insufficient documentation

## 2023-04-04 DIAGNOSIS — R2689 Other abnormalities of gait and mobility: Secondary | ICD-10-CM | POA: Insufficient documentation

## 2023-04-04 DIAGNOSIS — M6281 Muscle weakness (generalized): Secondary | ICD-10-CM | POA: Diagnosis present

## 2023-04-04 DIAGNOSIS — H8110 Benign paroxysmal vertigo, unspecified ear: Secondary | ICD-10-CM | POA: Insufficient documentation

## 2023-04-04 DIAGNOSIS — I69351 Hemiplegia and hemiparesis following cerebral infarction affecting right dominant side: Secondary | ICD-10-CM | POA: Diagnosis present

## 2023-04-04 DIAGNOSIS — R2681 Unsteadiness on feet: Secondary | ICD-10-CM | POA: Diagnosis present

## 2023-04-04 DIAGNOSIS — R42 Dizziness and giddiness: Secondary | ICD-10-CM | POA: Diagnosis present

## 2023-04-04 NOTE — Therapy (Signed)
OUTPATIENT PHYSICAL THERAPY NEURO EVALUATION   Patient Name: Felicia Chen MRN: [id number] DOB:1965/10/02, 58 y.o., female Today's Date: 04/05/2023   PCP: [name], PA-C  REFERRING PROVIDER: [name], MD   END OF SESSION:  PT End of Session - 04/05/23 0909     Visit Number 1    Number of Visits 9    Date for PT Re-Evaluation 06/04/23    Authorization Type [employer]    PT Start Time 1615    PT Stop Time 1700    PT Time Calculation (min) 45 min    Equipment Utilized During Treatment Gait belt    Activity Tolerance Patient tolerated treatment well    Behavior During Therapy WFL for tasks assessed/performed;Anxious             Past Medical History:  Diagnosis Date   Anxiety    Gilbert's syndrome    Past Surgical History:  Procedure Laterality Date   ABDOMINAL HYSTERECTOMY N/A 06/11/2012   Procedure: HYSTERECTOMY ABDOMINAL;  Surgeon: [name], MD;  Location: [facility];  Service: Gynecology;  Laterality: N/A;   CESAREAN SECTION     x 4   CYSTOSCOPY N/A 06/11/2012   Procedure: CYSTOSCOPY;  Surgeon: [name], MD;  Location: [facility];  Service: Gynecology;  Laterality: N/A;   HERNIA REPAIR     LAPAROSCOPIC ASSISTED VAGINAL HYSTERECTOMY N/A 06/11/2012   Procedure: LAPAROSCOPIC ASSISTED VAGINAL HYSTERECTOMY atttempted;  Surgeon: [name], MD;  Location: [facility];  Service: Gynecology;  Laterality: N/A;  Attempted Laparoscopic assisted vaginal hysterectomy.   LAPAROSCOPIC LYSIS OF ADHESIONS N/A 06/11/2012   Procedure: LAPAROSCOPIC LYSIS OF ADHESIONS;  Surgeon: [name], MD;  Location: [facility];  Service: Gynecology;  Laterality: N/A;   SALPINGOOPHORECTOMY Bilateral 06/11/2012   Procedure: SALPINGO OOPHORECTOMY;  Surgeon: [name], MD;  Location: [facility];  Service: Gynecology;  Laterality: Bilateral;   Patient Active Problem List   Diagnosis Date Noted   Generalized anxiety disorder 05/24/2022   Spasticity as late effect  of cerebrovascular accident (CVA) 10/21/2021   Abnormal gait due to muscle weakness 10/21/2021   Nausea without vomiting    Spastic hemiparesis (HCC)    History of hypertension    Neurogenic orthostatic hypotension (HCC)    Transaminitis    Right hemiplegia (HCC)    Hemorrhagic stroke (HCC)    Leucocytosis    Essential hypertension    Dyslipidemia    Right hemiparesis (HCC)    ICH (intracerebral hemorrhage) (HCC) 12/01/2020   HYPERLIPIDEMIA 07/06/2009   OBESITY 07/06/2009    ONSET DATE: 03/27/2023   REFERRING DIAG: H81.10 (ICD-10-CM) - BPPV (benign paroxysmal positional vertigo), unspecified laterality   THERAPY DIAG:  Dizziness and giddiness  Unsteadiness on feet  Other abnormalities of gait and mobility  Hemiplegia and hemiparesis following cerebral infarction affecting right dominant side (HCC)  Muscle weakness (generalized)  Other symptoms and signs involving the nervous system  Rationale for Evaluation and Treatment: Rehabilitation  SUBJECTIVE:  SUBJECTIVE STATEMENT: Had Covid last fall in September. And had a fall and noticed the dizziness again. Was really dizzy and nauseous. Was trying to see an ENT, but has still not seen them. Reports mother is staying with them because she broke her hip. Had a lot of stressful things going on. Had to put her dog down. Was sitting down and had a seizure. Taking Keppra now for seizures. Has had just a overall general decline because she wasn't moving. Had a fall with the cane around Christmas. Just got dizzy and fell into the wall. Reports the nausea was really bad in the first 2 months, now it isn't as bad. Has been trying to move, but doing so more carefully. Was doing really well and living her life and just went downhill. Was doing a lot and now  doing as much at home. Has a lot of fear and nervousness. Feels like she is currently bobbing in the ocean. 10 minutes into walking will get nauseous again. Has to have an EEG, is scheduled for this on 04/18/23. Feeling more anxious. Has been taking shorter steps, but has been feeling like she is more aligned. Has been going barefoot at home and feels like she has better balance.   Pt accompanied by:  pt's daughter   PERTINENT HISTORY: PMH:  hx of R hemiplegia due to stroke in 10/22- secondary to L frontal ICH and small SAH- with associated spasticity, anxiety, Gilbert's syndrome, HLD  Per Dr. Pearlean Brownie: She presented to emergency room on 02/05/2023 with witnessed seizure activity by family.  She had been undergoing through a lot of stressful.  She unfortunately had just put down her dog that day..  She started developing jerking and left leg subsequently it spread to involve all 4 extremities and she was unresponsive for 2 minutes and seizure activity resolved.  She was subsequently somnolent and confused and returned to her baseline.  Patient was given Keppra 1 gram loading dose and subsequently discharged on 500 mg twice daily.  MRI scan of the brain was obtained which showed no acute abnormality.  Comprehensive metabolic panel labs and CBC were fairly unremarkable.  Patient is tolerating Keppra well without any significant side effects of breakthrough seizures.   However she is states that she has had 2 minor falls and head the left side of her head and which has led to relapse of her prior benign paroxysmal positional vertigo   PAIN:  Are you having pain? No  PRECAUTIONS: Fall  RED FLAGS: None   WEIGHT BEARING RESTRICTIONS: No  FALLS: Has patient fallen in last 6 months? Yes. Number of falls 2  LIVING ENVIRONMENT: Lives with: lives with their family and lives with their spouse Lives in: House/apartment Stairs: Yes: Internal: 20 steps; on right going up and on left going up Has following  equipment at home: Quad cane small base, SPC with 4 prong tip   PLOF: Independent with household mobility with device and Independent with community mobility with device  PATIENT GOALS: Wants to get dizziness under control and getting strength back.   OBJECTIVE:  Note: Objective measures were completed at Evaluation unless otherwise noted.  DIAGNOSTIC FINDINGS: From 02/06/23: MRI shows 1. No acute intracranial process. 2. No definite acute hemorrhage is seen to correlate with the hyperdensity noted on the 02/05/2023 CT, which may have represented mineralization from more remote hemorrhage. Per radiologist's interpretation  COGNITION: Overall cognitive status: Within functional limits for tasks assessed   SENSATION: [facility]  Reports they  feel heavier   COORDINATION: Heel to shin: slower to perform with RLE   MUSCLE TONE: RLE: Hypertonic   POSTURE: rounded shoulders  LOWER EXTREMITY ROM:    Decr AROM with RLE due to weakness   LOWER EXTREMITY MMT:    MMT Right Eval Left Eval  Hip flexion 4- 5  Hip extension    Hip abduction 4+ 5  Hip adduction 5 5  Hip internal rotation    Hip external rotation    Knee flexion 3- 5  Knee extension 4- (compensates with external rotation) 5  Ankle dorsiflexion 3- 5  Ankle plantarflexion    Ankle inversion    Ankle eversion    (Blank rows = not tested)  All tested in sitting    TRANSFERS: Assistive device utilized: None  Sit to stand: SBA and CGA Stand to sit: SBA and CGA Pt to need BUE or single UE support to stand,pt anxious when performing.   Pt too fearful to stand without use of UE support, performs with more narrow BOS, needing 1-2 episodes of CGA initially for balance   GAIT: Gait pattern: step to pattern, step through pattern, decreased arm swing- Right, decreased step length- Left, decreased stance time- Right, decreased stride length, decreased hip/knee flexion- Right, decreased ankle dorsiflexion- Right, and Right foot  flat Distance walked: Clinic distances  Assistive device utilized: Single point cane with 4 prong tip  Level of assistance: SBA and CGA Comments: Pt guarded during gait, with decr step length with LLE and decr stance time on RLE.   FUNCTIONAL TESTS:  5 times sit to stand: 17.22 seconds, pt using single and BUE support to stand 10 meter walk test: 46.69 seconds = .70 ft/sec with SPC with 4 prong tip                                                                                                                               TREATMENT DATE:   N/A during eval.   PATIENT EDUCATION: Education details: Clinical findings, POC, will perform vestibular assessment at next session and for pt to hold off on vestibular exercises at this time. Pt also reports more difficulty using RUE, discussed possibility of OT at this time, pt wants to work on her OT exercises at home and then decide if she would like to pursue OT. Restarting LYB yoga in February for movement and meditation/breathing.  Person educated: Patient and Child(ren) Education method: Explanation Education comprehension: verbalized understanding  HOME EXERCISE PROGRAM: Will provide at future session.   GOALS: Goals reviewed with patient? Yes  SHORT TERM GOALS: Target date: 05/03/2023  Pt will be independent with initial HEP for strength, dizziness, balance in order to build upon functional gains made in therapy. Baseline: Goal status: INITIAL  2.  Further vestibular assessment to be performed with LTG written. Baseline:  Goal status: INITIAL  3.  TUG to be assessed with LTG written.  Baseline:  Goal status: INITIAL  4.  Pt will improve 5x sit<>stand to less than or equal to 15 sec to demonstrate improved functional strength and transfer efficiency.   Baseline: 17.22 seconds, pt using single and BUE support to stand Goal status: INITIAL  5.  Pt will improve gait speed with LRAD to at least 1.1 ft/sec in order to demo decr fall  risk.   Baseline: 46.69 seconds = .70 ft/sec with SPC with 4 prong tip  Goal status: INITIAL    LONG TERM GOALS: Target date: 05/31/2023  Pt will be independent with final HEP for strength, dizziness, balance in order to build upon functional gains made in therapy. Baseline:  Goal status: INITIAL  2.  Vestibular goal to be written as appropriate.  Baseline:  Goal status: INITIAL  3.  TUG goal to be written.  Baseline:  Goal status: INITIAL  4.   Pt will improve 5x sit<>stand to less than or equal to 15 sec with no UE support to demonstrate improved functional strength and transfer efficiency.  Baseline: 17.22 seconds, pt using single and BUE support to stand Goal status: INITIAL  5.   Pt will improve gait speed with LRAD to at least 1.8 ft/sec in order to demo decr fall risk.  Baseline: 46.69 seconds = .70 ft/sec with SPC with 4 prong tip  Goal status: INITIAL    ASSESSMENT:  CLINICAL IMPRESSION: Patient is a 58 year old female referred to Neuro OPPT for dizziness who is well known to this clinic.   Pt's PMH is significant for: hx of R hemiplegia due to stroke in 10/22- secondary to L frontal ICH and small SAH- with associated spasticity, anxiety, Gilbert's syndrome, HLD, recent seizure. Pt with recent seizure and also a couple falls and pt having dizziness. Pt has not been moving as much at home due to dizziness and fear of falling. Did not have the time to perform thorough vestibular assessment today. Pt anxious during eval. Will perform at next session. The following deficits were present during the exam: decr strength, decr ROM, difficulty with sit <> stands, impaired balance, gait abnormalities, spasticity of RLE.  Based on hx of falls, 5x sit <> stand, and gait speed, pt is an incr risk for falls. Pt would benefit from skilled PT to address these impairments and functional limitations to maximize functional mobility independence, decr dizziness and decr fall risk.     OBJECTIVE IMPAIRMENTS: Abnormal gait, decreased activity tolerance, decreased balance, decreased coordination, decreased endurance, decreased mobility, difficulty walking, decreased ROM, decreased strength, dizziness, increased muscle spasms, impaired flexibility, impaired tone, impaired UE functional use, and postural dysfunction.   ACTIVITY LIMITATIONS: carrying, lifting, standing, stairs, transfers, and locomotion level  PARTICIPATION LIMITATIONS: meal prep, cleaning, driving, and community activity  PERSONAL FACTORS: Age, Past/current experiences, Time since onset of injury/illness/exacerbation, and 3+ comorbidities: hx of R hemiplegia due to stroke in 10/22- secondary to L frontal ICH and small SAH- with associated spasticity, anxiety, Gilbert's syndrome, HLD, recent seizure  are also affecting patient's functional outcome.   REHAB POTENTIAL: Good  CLINICAL DECISION MAKING: Evolving/moderate complexity  EVALUATION COMPLEXITY: Moderate  PLAN:  PT FREQUENCY: 1x/week  PT DURATION: 8 weeks  PLANNED INTERVENTIONS: 97164- PT Re-evaluation, 97110-Therapeutic exercises, 97530- Therapeutic activity, 97112- Neuromuscular re-education, 97535- Self Care, 16109- Manual therapy, [phone]- Gait training, [phone]- Orthotic Fit/training, [phone]- Canalith repositioning, Patient/Family education, Balance training, Stair training, Vestibular training, and DME instructions  PLAN FOR NEXT SESSION: Perform further vestibular assessment, perform TUG and write goals. Initial HEP for RLE  strengthening. Work on RLE weight shifting/bearing exercises  Drake Leach, PT, DPT 04/05/2023, 9:11 AM

## 2023-04-10 ENCOUNTER — Encounter: Payer: Self-pay | Admitting: Physical Therapy

## 2023-04-10 ENCOUNTER — Ambulatory Visit: Payer: 59 | Admitting: Physical Therapy

## 2023-04-16 ENCOUNTER — Other Ambulatory Visit: Payer: Self-pay | Admitting: Physical Medicine and Rehabilitation

## 2023-04-17 ENCOUNTER — Ambulatory Visit: Payer: 59 | Admitting: Physical Therapy

## 2023-04-17 ENCOUNTER — Encounter: Payer: Self-pay | Admitting: Physical Therapy

## 2023-04-17 VITALS — BP 112/68 | HR 79

## 2023-04-17 DIAGNOSIS — R2689 Other abnormalities of gait and mobility: Secondary | ICD-10-CM

## 2023-04-17 DIAGNOSIS — R42 Dizziness and giddiness: Secondary | ICD-10-CM | POA: Diagnosis not present

## 2023-04-17 DIAGNOSIS — R2681 Unsteadiness on feet: Secondary | ICD-10-CM

## 2023-04-17 NOTE — Therapy (Signed)
OUTPATIENT PHYSICAL THERAPY NEURO TREATMENT   Patient Name: Felicia Chen MRN: [id number] DOB:Jan 01, 1966, 58 y.o., female Today's Date: 04/17/2023   PCP: [name], PA-C  REFERRING PROVIDER: [name], MD   END OF SESSION:  PT End of Session - 04/17/23 1018     Visit Number 2    Number of Visits 9    Date for PT Re-Evaluation 06/04/23    Authorization Type [employer]    PT Start Time 1016    PT Stop Time 1058    PT Time Calculation (min) 42 min    Equipment Utilized During Treatment --    Activity Tolerance Patient tolerated treatment well    Behavior During Therapy WFL for tasks assessed/performed;Anxious             Past Medical History:  Diagnosis Date   Anxiety    Gilbert's syndrome    Past Surgical History:  Procedure Laterality Date   ABDOMINAL HYSTERECTOMY N/A 06/11/2012   Procedure: HYSTERECTOMY ABDOMINAL;  Surgeon: [name], MD;  Location: [facility];  Service: Gynecology;  Laterality: N/A;   CESAREAN SECTION     x 4   CYSTOSCOPY N/A 06/11/2012   Procedure: CYSTOSCOPY;  Surgeon: [name], MD;  Location: [facility];  Service: Gynecology;  Laterality: N/A;   HERNIA REPAIR     LAPAROSCOPIC ASSISTED VAGINAL HYSTERECTOMY N/A 06/11/2012   Procedure: LAPAROSCOPIC ASSISTED VAGINAL HYSTERECTOMY atttempted;  Surgeon: [name], MD;  Location: [facility];  Service: Gynecology;  Laterality: N/A;  Attempted Laparoscopic assisted vaginal hysterectomy.   LAPAROSCOPIC LYSIS OF ADHESIONS N/A 06/11/2012   Procedure: LAPAROSCOPIC LYSIS OF ADHESIONS;  Surgeon: [name], MD;  Location: [facility];  Service: Gynecology;  Laterality: N/A;   SALPINGOOPHORECTOMY Bilateral 06/11/2012   Procedure: SALPINGO OOPHORECTOMY;  Surgeon: [name], MD;  Location: [facility];  Service: Gynecology;  Laterality: Bilateral;   Patient Active Problem List   Diagnosis Date Noted   Generalized anxiety disorder 05/24/2022   Spasticity as late effect of  cerebrovascular accident (CVA) 10/21/2021   Abnormal gait due to muscle weakness 10/21/2021   Nausea without vomiting    Spastic hemiparesis (HCC)    History of hypertension    Neurogenic orthostatic hypotension (HCC)    Transaminitis    Right hemiplegia (HCC)    Hemorrhagic stroke (HCC)    Leucocytosis    Essential hypertension    Dyslipidemia    Right hemiparesis (HCC)    ICH (intracerebral hemorrhage) (HCC) 12/01/2020   HYPERLIPIDEMIA 07/06/2009   OBESITY 07/06/2009    ONSET DATE: 03/27/2023   REFERRING DIAG: H81.10 (ICD-10-CM) - BPPV (benign paroxysmal positional vertigo), unspecified laterality   THERAPY DIAG:  Dizziness and giddiness  Unsteadiness on feet  Other abnormalities of gait and mobility  Rationale for Evaluation and Treatment: Rehabilitation  SUBJECTIVE:  SUBJECTIVE STATEMENT: Had a fall last week, was at night and she took an Ambien and then was up in the middle night with her husband and hit the wall and then fell on the floor. Reports the dizziness is here all the time. Some days has terrible nausea and some has none. Has difficulty stepping with her R leg with holding on with R hand.   Pt accompanied by:  pt's daughter   PERTINENT HISTORY: PMH:  hx of R hemiplegia due to stroke in 10/22- secondary to L frontal ICH and small SAH- with associated spasticity, anxiety, Gilbert's syndrome, HLD  Per Dr. Pearlean Brownie: She presented to emergency room on 02/05/2023 with witnessed seizure activity by family.  She had been undergoing through a lot of stressful.  She unfortunately had just put down her dog that day..  She started developing jerking and left leg subsequently it spread to involve all 4 extremities and she was unresponsive for 2 minutes and seizure activity resolved.  She was  subsequently somnolent and confused and returned to her baseline.  Patient was given Keppra 1 gram loading dose and subsequently discharged on 500 mg twice daily.  MRI scan of the brain was obtained which showed no acute abnormality.  Comprehensive metabolic panel labs and CBC were fairly unremarkable.  Patient is tolerating Keppra well without any significant side effects of breakthrough seizures.   However she is states that she has had 2 minor falls and head the left side of her head and which has led to relapse of her prior benign paroxysmal positional vertigo   PAIN:  Are you having pain? No  Vitals:   04/17/23 1030  BP: 112/68  Pulse: 79     PRECAUTIONS: Fall  RED FLAGS: None   WEIGHT BEARING RESTRICTIONS: No  FALLS: Has patient fallen in last 6 months? Yes. Number of falls 2  LIVING ENVIRONMENT: Lives with: lives with their family and lives with their spouse Lives in: House/apartment Stairs: Yes: Internal: 20 steps; on right going up and on left going up Has following equipment at home: Quad cane small base, SPC with 4 prong tip   PLOF: Independent with household mobility with device and Independent with community mobility with device  PATIENT GOALS: Wants to get dizziness under control and getting strength back.   OBJECTIVE:  Note: Objective measures were completed at Evaluation unless otherwise noted.  DIAGNOSTIC FINDINGS: From 02/06/23: MRI shows 1. No acute intracranial process. 2. No definite acute hemorrhage is seen to correlate with the hyperdensity noted on the 02/05/2023 CT, which may have represented mineralization from more remote hemorrhage. Per radiologist's interpretation  COGNITION: Overall cognitive status: Within functional limits for tasks assessed   SENSATION: WFL  Reports they feel heavier   COORDINATION: Heel to shin: slower to perform with RLE   MUSCLE TONE: RLE: Hypertonic   POSTURE: rounded shoulders  LOWER EXTREMITY ROM:    Decr  AROM with RLE due to weakness   LOWER EXTREMITY MMT:    MMT Right Eval Left Eval  Hip flexion 4- 5  Hip extension    Hip abduction 4+ 5  Hip adduction 5 5  Hip internal rotation    Hip external rotation    Knee flexion 3- 5  Knee extension 4- (compensates with external rotation) 5  Ankle dorsiflexion 3- 5  Ankle plantarflexion    Ankle inversion    Ankle eversion    (Blank rows = not tested)  All tested in sitting  TRANSFERS: Assistive device utilized: None  Sit to stand: SBA and CGA Stand to sit: SBA and CGA Pt to need BUE or single UE support to stand,pt anxious when performing.   Pt too fearful to stand without use of UE support, performs with more narrow BOS, needing 1-2 episodes of CGA initially for balance   GAIT: Gait pattern: step to pattern, step through pattern, decreased arm swing- Right, decreased step length- Left, decreased stance time- Right, decreased stride length, decreased hip/knee flexion- Right, decreased ankle dorsiflexion- Right, and Right foot flat Distance walked: Clinic distances  Assistive device utilized: Single point cane with 4 prong tip  Level of assistance: SBA and CGA Comments: Pt guarded during gait, with decr step length with LLE and decr stance time on RLE.   FUNCTIONAL TESTS:  5 times sit to stand: 17.22 seconds, pt using single and BUE support to stand 10 meter walk test: 46.69 seconds = .70 ft/sec with SPC with 4 prong tip                                                                                                                               TREATMENT DATE:    VESTIBULAR ASSESSMENT   GENERAL OBSERVATION: Ambulates in with cane with 4 prong tip, anxious and needs CGA    SYMPTOM BEHAVIOR:   Subjective history: Does not even remember what it feels like to not feel good. Started in the fall    Non-Vestibular symptoms: nausea/vomiting and not as bad as it was.    Type of dizziness: "Funny feeling in the head" and sometimes  head feels heavy and feels like her head is a bobble head    Frequency: Daily    Duration: Happens when she is moving    Aggravating factors: Induced by position change: sit to stand and walking   Relieving factors: lying supine and or sitting in chair   Progression of symptoms: better   OCULOMOTOR EXAM:   Ocular Alignment: normal   Ocular ROM: No Limitations   Spontaneous Nystagmus: absent   Gaze-Induced Nystagmus: absent   Smooth Pursuits: intact   Saccades:  ~2 saccadic beats, reporting horizontal direction made pt feel a little off    VESTIBULAR - OCULAR REFLEX:    Slow VOR: Normal and Comment: pt reporting mild dizziness    VOR Cancellation: Normal, pt reporting a little dizzy    Head-Impulse Test: HIT Right: positive HIT Left: negative Pt reporting 6-7/10 dizziness      POSITIONAL TESTING: Right Dix-Hallpike: no nystagmus and pt dizzy and with return to upright, worse than L  Left Dix-Hallpike: no nystagmus and pt with some dizziness  Right Roll Test: no nystagmus Left Roll Test: no nystagmus Right Sidelying: no nystagmus and feeling a little dizzy in position, with return to upright reports a nausea feeling dizzy  Left Sidelying: no nystagmus and feeling a little off, not as bad as the R, dizzy with return to  upright     PATIENT EDUCATION: Education details: Vestibular assessment findings, discussed cause of pt's dizziness can be a mix of post COVID dizziness, vestibular hypofunction, motion sensitivity, anxiety, and from not moving. Discussed re-starting [employer] exercises for home for habituation and purpose of habituation exercises.  Person educated: Patient and Child(ren) Education method: Explanation, Demonstration, Verbal cues, and Handouts Education comprehension: verbalized understanding, returned demonstration, and verbal cues required  HOME EXERCISE PROGRAM: Access Code: [email or web address] URL: [email or web address] Date: 04/17/2023 Prepared by:  Sherlie Ban  Exercises - Brandt-Daroff Vestibular Exercise  - 2 x daily - 5 x weekly - 4-5 reps  GOALS: Goals reviewed with patient? Yes  SHORT TERM GOALS: Target date: 05/03/2023  Pt will be independent with initial HEP for strength, dizziness, balance in order to build upon functional gains made in therapy. Baseline: Goal status: INITIAL  2.  Further vestibular assessment to be performed with LTG written. Baseline:  Goal status: INITIAL  3.  TUG to be assessed with LTG written.  Baseline:  Goal status: INITIAL  4.  Pt will improve 5x sit<>stand to less than or equal to 15 sec to demonstrate improved functional strength and transfer efficiency.   Baseline: 17.22 seconds, pt using single and BUE support to stand Goal status: INITIAL  5.  Pt will improve gait speed with LRAD to at least 1.1 ft/sec in order to demo decr fall risk.   Baseline: 46.69 seconds = .70 ft/sec with SPC with 4 prong tip  Goal status: INITIAL    LONG TERM GOALS: Target date: 05/31/2023  Pt will be independent with final HEP for strength, dizziness, balance in order to build upon functional gains made in therapy. Baseline:  Goal status: INITIAL  2.  Vestibular goal to be written as appropriate.  Baseline:  Goal status: INITIAL  3.  TUG goal to be written.  Baseline:  Goal status: INITIAL  4.   Pt will improve 5x sit<>stand to less than or equal to 15 sec with no UE support to demonstrate improved functional strength and transfer efficiency.  Baseline: 17.22 seconds, pt using single and BUE support to stand Goal status: INITIAL  5.   Pt will improve gait speed with LRAD to at least 1.8 ft/sec in order to demo decr fall risk.  Baseline: 46.69 seconds = .70 ft/sec with SPC with 4 prong tip  Goal status: INITIAL    ASSESSMENT:  CLINICAL IMPRESSION:  Today's skilled session focused on vestibular assessment with pt having dizziness with slow VOR and VOR cancellation but WNL. Pt with  positive HIT to the R and dizziness, indicating vestibular hypofunction. Pt negative for BPPV testing, but did have dizziness with sidelying and DixHallpike testing and with return to upright, more so on the R than L. No nystagmus noted. Educated pt to restart [employer] exercises for habituation. Pt's dizziness is likely multi-factorial between a hypofunction, post COVID dizziness, anxiety, and motion sensitivities. Will continue per POC.     OBJECTIVE IMPAIRMENTS: Abnormal gait, decreased activity tolerance, decreased balance, decreased coordination, decreased endurance, decreased mobility, difficulty walking, decreased ROM, decreased strength, dizziness, increased muscle spasms, impaired flexibility, impaired tone, impaired UE functional use, and postural dysfunction.   ACTIVITY LIMITATIONS: carrying, lifting, standing, stairs, transfers, and locomotion level  PARTICIPATION LIMITATIONS: meal prep, cleaning, driving, and community activity  PERSONAL FACTORS: Age, Past/current experiences, Time since onset of injury/illness/exacerbation, and 3+ comorbidities: hx of R hemiplegia due to stroke in 10/22- secondary to L frontal ICH and small SAH- with  associated spasticity, anxiety, Gilbert's syndrome, HLD, recent seizure  are also affecting patient's functional outcome.   REHAB POTENTIAL: Good  CLINICAL DECISION MAKING: Evolving/moderate complexity  EVALUATION COMPLEXITY: Moderate  PLAN:  PT FREQUENCY: 1x/week  PT DURATION: 8 weeks  PLANNED INTERVENTIONS: 97164- PT Re-evaluation, 97110-Therapeutic exercises, 97530- Therapeutic activity, 97112- Neuromuscular re-education, 97535- Self Care, 13086- Manual therapy, [id number]- Gait training, [phone]- Orthotic Fit/training, [phone]- Canalith repositioning, Patient/Family education, Balance training, Stair training, Vestibular training, and DME instructions  PLAN FOR NEXT SESSION: PERFORM MSQ AND TUG AND WRITE GOALS.    Initial HEP for RLE  strengthening. Work on RLE [employer] shifting/bearing exercises  DO STAIRS  Drake Leach, PT, DPT 04/17/2023, 12:00 PM

## 2023-04-18 ENCOUNTER — Ambulatory Visit (INDEPENDENT_AMBULATORY_CARE_PROVIDER_SITE_OTHER): Payer: 59 | Admitting: Neurology

## 2023-04-18 DIAGNOSIS — G40209 Localization-related (focal) (partial) symptomatic epilepsy and epileptic syndromes with complex partial seizures, not intractable, without status epilepticus: Secondary | ICD-10-CM

## 2023-04-23 ENCOUNTER — Institutional Professional Consult (permissible substitution) (INDEPENDENT_AMBULATORY_CARE_PROVIDER_SITE_OTHER): Payer: 59

## 2023-04-24 ENCOUNTER — Ambulatory Visit: Payer: 59 | Attending: Neurology | Admitting: Physical Therapy

## 2023-04-24 ENCOUNTER — Encounter: Payer: Self-pay | Admitting: Physical Therapy

## 2023-04-24 VITALS — BP 104/67 | HR 98

## 2023-04-24 DIAGNOSIS — R42 Dizziness and giddiness: Secondary | ICD-10-CM | POA: Insufficient documentation

## 2023-04-24 DIAGNOSIS — R2689 Other abnormalities of gait and mobility: Secondary | ICD-10-CM | POA: Insufficient documentation

## 2023-04-24 DIAGNOSIS — R2681 Unsteadiness on feet: Secondary | ICD-10-CM | POA: Diagnosis present

## 2023-04-24 DIAGNOSIS — I69351 Hemiplegia and hemiparesis following cerebral infarction affecting right dominant side: Secondary | ICD-10-CM | POA: Insufficient documentation

## 2023-04-24 NOTE — Therapy (Signed)
 OUTPATIENT PHYSICAL THERAPY NEURO TREATMENT   Patient Name: Felicia Chen MRN: [id number] DOB:1966/01/04, 58 y.o., female Today's Date: 04/24/2023   PCP: Katina Pfeiffer, PA-C  REFERRING PROVIDER: Rosemarie Eather RAMAN, MD   END OF SESSION:  PT End of Session - 04/24/23 0935     Visit Number 3    Number of Visits 9    Date for PT Re-Evaluation 06/04/23    Authorization Type [employer]    PT Start Time 0932    PT Stop Time 1014    PT Time Calculation (min) 42 min    Equipment Utilized During Treatment Gait belt    Activity Tolerance Patient tolerated treatment well    Behavior During Therapy WFL for tasks assessed/performed;Anxious             Past Medical History:  Diagnosis Date   Anxiety    Gilbert's syndrome    Past Surgical History:  Procedure Laterality Date   ABDOMINAL HYSTERECTOMY N/A 06/11/2012   Procedure: HYSTERECTOMY ABDOMINAL;  Surgeon: Lynwood FORBES Curlene PONCE, MD;  Location: [facility];  Service: Gynecology;  Laterality: N/A;   CESAREAN SECTION     x 4   CYSTOSCOPY N/A 06/11/2012   Procedure: CYSTOSCOPY;  Surgeon: Lynwood FORBES Curlene PONCE, MD;  Location: [facility];  Service: Gynecology;  Laterality: N/A;   HERNIA REPAIR     LAPAROSCOPIC ASSISTED VAGINAL HYSTERECTOMY N/A 06/11/2012   Procedure: LAPAROSCOPIC ASSISTED VAGINAL HYSTERECTOMY atttempted;  Surgeon: Lynwood FORBES Curlene PONCE, MD;  Location: [facility];  Service: Gynecology;  Laterality: N/A;  Attempted Laparoscopic assisted vaginal hysterectomy.   LAPAROSCOPIC LYSIS OF ADHESIONS N/A 06/11/2012   Procedure: LAPAROSCOPIC LYSIS OF ADHESIONS;  Surgeon: Lynwood FORBES Curlene PONCE, MD;  Location: [facility];  Service: Gynecology;  Laterality: N/A;   SALPINGOOPHORECTOMY Bilateral 06/11/2012   Procedure: SALPINGO OOPHORECTOMY;  Surgeon: Lynwood FORBES Curlene PONCE, MD;  Location: [facility];  Service: Gynecology;  Laterality: Bilateral;   Patient Active Problem List   Diagnosis Date Noted   Generalized anxiety disorder 05/24/2022   Spasticity as late effect of  cerebrovascular accident (CVA) 10/21/2021   Abnormal gait due to muscle weakness 10/21/2021   Nausea without vomiting    Spastic hemiparesis (HCC)    History of hypertension    Neurogenic orthostatic hypotension (HCC)    Transaminitis    Right hemiplegia (HCC)    Hemorrhagic stroke (HCC)    Leucocytosis    Essential hypertension    Dyslipidemia    Right hemiparesis (HCC)    ICH (intracerebral hemorrhage) (HCC) 12/01/2020   HYPERLIPIDEMIA 07/06/2009   OBESITY 07/06/2009    ONSET DATE: 03/27/2023   REFERRING DIAG: H81.10 (ICD-10-CM) - BPPV (benign paroxysmal positional vertigo), unspecified laterality   THERAPY DIAG:  Dizziness and giddiness  Other abnormalities of gait and mobility  Unsteadiness on feet  Rationale for Evaluation and Treatment: Rehabilitation  SUBJECTIVE:  SUBJECTIVE STATEMENT: Went wedding dress shopping over the weekend with her daughter. Always feels off balance, when before she didn't. Also has been doing some of her right leg strength. Felt like she was coming down the stairs at a quicker pace today.   Pt accompanied by:  pt's daughter   PERTINENT HISTORY: PMH:  hx of R hemiplegia due to stroke in 10/22- secondary to L frontal ICH and small SAH- with associated spasticity, anxiety, Gilbert's syndrome, HLD  Per Dr. Rosemarie: She presented to emergency room on 02/05/2023 with witnessed seizure activity by family.  She had been undergoing through a lot of stressful.  She unfortunately had just put down her dog that day..  She started developing jerking and left leg subsequently it spread to involve all 4 extremities and she was unresponsive for 2 minutes and seizure activity resolved.  She was subsequently somnolent and confused and returned to her baseline.  Patient was given  Keppra  1 gram loading dose and subsequently discharged on 500 mg twice daily.  MRI scan of the brain was obtained which showed no acute abnormality.  Comprehensive metabolic panel labs and CBC were fairly unremarkable.  Patient is tolerating Keppra  well without any significant side effects of breakthrough seizures.   However she is states that she has had 2 minor falls and head the left side of her head and which has led to relapse of her prior benign paroxysmal positional vertigo   PAIN:  Are you having pain? No  Vitals:   04/24/23 0940 04/24/23 0941  BP: 116/74 104/67  Pulse: 88 98      PRECAUTIONS: Fall  RED FLAGS: None   WEIGHT BEARING RESTRICTIONS: No  FALLS: Has patient fallen in last 6 months? Yes. Number of falls 2  LIVING ENVIRONMENT: Lives with: lives with their family and lives with their spouse Lives in: House/apartment Stairs: Yes: Internal: 20 steps; on right going up and on left going up Has following equipment at home: Quad cane small base, SPC with 4 prong tip   PLOF: Independent with household mobility with device and Independent with community mobility with device  PATIENT GOALS: Wants to get dizziness under control and getting strength back.   OBJECTIVE:  Note: Objective measures were completed at Evaluation unless otherwise noted.  DIAGNOSTIC FINDINGS: From 02/06/23: MRI shows 1. No acute intracranial process. 2. No definite acute hemorrhage is seen to correlate with the hyperdensity noted on the 02/05/2023 CT, which may have represented mineralization from more remote hemorrhage. Per radiologist's interpretation  COGNITION: Overall cognitive status: Within functional limits for tasks assessed   SENSATION: WFL  Reports they feel heavier   COORDINATION: Heel to shin: slower to perform with RLE   MUSCLE TONE: RLE: Hypertonic   POSTURE: rounded shoulders  LOWER EXTREMITY ROM:    Decr AROM with RLE due to weakness   LOWER EXTREMITY MMT:     MMT Right Eval Left Eval  Hip flexion 4- 5  Hip extension    Hip abduction 4+ 5  Hip adduction 5 5  Hip internal rotation    Hip external rotation    Knee flexion 3- 5  Knee extension 4- (compensates with external rotation) 5  Ankle dorsiflexion 3- 5  Ankle plantarflexion    Ankle inversion    Ankle eversion    (Blank rows = not tested)  All tested in sitting    TRANSFERS: Assistive device utilized: None  Sit to stand: SBA and CGA Stand to sit: SBA and CGA  Pt to need BUE or single UE support to stand,pt anxious when performing.   Pt too fearful to stand without use of UE support, performs with more narrow BOS, needing 1-2 episodes of CGA initially for balance   GAIT: Gait pattern: step to pattern, step through pattern, decreased arm swing- Right, decreased step length- Left, decreased stance time- Right, decreased stride length, decreased hip/knee flexion- Right, decreased ankle dorsiflexion- Right, and Right foot flat Distance walked: Clinic distances  Assistive device utilized: Single point cane with 4 prong tip  Level of assistance: SBA and CGA Comments: Pt guarded during gait, with decr step length with LLE and decr stance time on RLE.   FUNCTIONAL TESTS:  5 times sit to stand: 17.22 seconds, pt using single and BUE support to stand 10 meter walk test: 46.69 seconds = .70 ft/sec with SPC with 4 prong tip                                                                                                                               TREATMENT DATE:    Vitals:   04/24/23 0940 04/24/23 0941  BP: 116/74 104/67  Pulse: 88 98   Sitting, Standing   Discussed that pt does not have orthostatic hypotension, but did have a little bit of a drop in her BP that could be contributing to her feeling a little off when standing up. Discussed making sure that pt is staying well hydrated (pt reports she is currently not drinking enough water ),to take time with transitions, and to  perform seated marching or ankle pumps before standing   Gaze Adaptation: x1 Viewing Horizontal: Position: Seated, Time: 30 seconds, Reps: 2, and Comment: Pt reporting feeling mildly off  x1 Viewing Vertical:  Position: Seated, Time: 30 seconds, Reps: 2, and Comment: pt reporting having a little more symptoms than side to side  Needing a brief rest break between each   Access Code: [email or web address] URL: [email or web address] Date: 04/24/2023 Prepared by: Blessen Kimbrough  Added bolded exercises below to address balance:   Exercises - Brandt-Daroff Vestibular Exercise  - 2 x daily - 5 x weekly - 4-5 reps - Romberg Stance with Eyes Closed  - 1 x daily - 5 x weekly - 3 sets - 30 hold - beginning with feet apart > feet closer together, pt able to perform with slight space between feet - Standing Romberg to 3/4 Tandem Stance (Mirrored)  - 1 x daily - 5 x weekly - 3 sets - 30 hold - attempted tandem stance with RLE posteriorly, but pt a little bit unsteady, so instead performed 3/4 tandem  - Romberg Stance with Head Rotation  - 1 x daily - 5 x weekly - 2 sets - 10 reps - with feet slightly apart, performed 10 reps head turns, performed 10 reps head nods   PATIENT EDUCATION: Education details: Discussed making sure pt stays well hydrated with drinking water , additions to HEP for gaze  stabilization and balance and purpose of exercises  Person educated: Patient and Child(ren) Education method: Explanation, Demonstration, Verbal cues, and Handouts Education comprehension: verbalized understanding, returned demonstration, and verbal cues required  HOME EXERCISE PROGRAM: Seated VOR x1 in horizontal and vertical direction for 30 seconds  Access Code: [email or web address] URL: [email or web address] Date: 04/24/2023 Prepared by: Sheffield Senate  Exercises - Brandt-Daroff Vestibular Exercise  - 2 x daily - 5 x weekly - 4-5 reps - Romberg Stance with Eyes Closed  - 1 x daily - 5 x weekly -  3 sets - 30 hold - Standing Romberg to 3/4 Tandem Stance (Mirrored)  - 1 x daily - 5 x weekly - 3 sets - 30 hold - Romberg Stance with Head Rotation  - 1 x daily - 5 x weekly - 2 sets - 10 reps  GOALS: Goals reviewed with patient? Yes  SHORT TERM GOALS: Target date: 05/03/2023  Pt will be independent with initial HEP for strength, dizziness, balance in order to build upon functional gains made in therapy. Baseline: Goal status: INITIAL  2.  Further vestibular assessment to be performed with LTG written. Baseline:  Goal status: INITIAL  3.  TUG to be assessed with LTG written.  Baseline:  Goal status: INITIAL  4.  Pt will improve 5x sit<>stand to less than or equal to 15 sec to demonstrate improved functional strength and transfer efficiency.   Baseline: 17.22 seconds, pt using single and BUE support to stand Goal status: INITIAL  5.  Pt will improve gait speed with LRAD to at least 1.1 ft/sec in order to demo decr fall risk.   Baseline: 46.69 seconds = .70 ft/sec with SPC with 4 prong tip  Goal status: INITIAL    LONG TERM GOALS: Target date: 05/31/2023  Pt will be independent with final HEP for strength, dizziness, balance in order to build upon functional gains made in therapy. Baseline:  Goal status: INITIAL  2.  Vestibular goal to be written as appropriate.  Baseline:  Goal status: INITIAL  3.  TUG goal to be written.  Baseline:  Goal status: INITIAL  4.   Pt will improve 5x sit<>stand to less than or equal to 15 sec with no UE support to demonstrate improved functional strength and transfer efficiency.  Baseline: 17.22 seconds, pt using single and BUE support to stand Goal status: INITIAL  5.   Pt will improve gait speed with LRAD to at least 1.8 ft/sec in order to demo decr fall risk.  Baseline: 46.69 seconds = .70 ft/sec with SPC with 4 prong tip  Goal status: INITIAL    ASSESSMENT:  CLINICAL IMPRESSION:  Assessed BP in sitting and standing today with  pt reporting feeling slightly unsteady when doing transitional movements. Pt with no orthostatics noted, but did have a slight drop in systolic BP between sitting and standing. Educated on making sure pt stays well hydrated (pt reports currently not drinking enough water ) and taking time with transitions. Remainder of session focused on adding balance exercises to HEP with more narrow BOS, vision removed, and head motions. Also worked on gaze stabilization tasks (pt felt dizzy with this during vestibular assessment at previous session), so added to HEP as well. Pt more symptomatic in the vertical direction. Will continue per POC.     OBJECTIVE IMPAIRMENTS: Abnormal gait, decreased activity tolerance, decreased balance, decreased coordination, decreased endurance, decreased mobility, difficulty walking, decreased ROM, decreased strength, dizziness, increased muscle spasms, impaired flexibility, impaired tone, impaired UE functional use, and  postural dysfunction.   ACTIVITY LIMITATIONS: carrying, lifting, standing, stairs, transfers, and locomotion level  PARTICIPATION LIMITATIONS: meal prep, cleaning, driving, and community activity  PERSONAL FACTORS: Age, Past/current experiences, Time since onset of injury/illness/exacerbation, and 3+ comorbidities: hx of R hemiplegia due to stroke in 10/22- secondary to L frontal ICH and small SAH- with associated spasticity, anxiety, Gilbert's syndrome, HLD, recent seizure  are also affecting patient's functional outcome.   REHAB POTENTIAL: Good  CLINICAL DECISION MAKING: Evolving/moderate complexity  EVALUATION COMPLEXITY: Moderate  PLAN:  PT FREQUENCY: 1x/week  PT DURATION: 8 weeks  PLANNED INTERVENTIONS: 97164- PT Re-evaluation, 97110-Therapeutic exercises, 97530- Therapeutic activity, 97112- Neuromuscular re-education, 97535- Self Care, [id number]- Manual therapy, [id number]- Gait training, [phone]- Orthotic Fit/training, [phone]- Canalith repositioning,  Patient/Family education, Balance training, Stair training, Vestibular training, and DME instructions  PLAN FOR NEXT SESSION: PERFORM MSQ AND TUG AND WRITE GOALS.    Initial HEP for RLE strengthening. Work on RLE weight shifting/bearing exercises, balance exercises in corner   DO [employer], PT, DPT 04/24/2023, 10:18 AM

## 2023-04-24 NOTE — Patient Instructions (Signed)
 Gaze Stabilization: Sitting    Keeping eyes on target on wall a few feet away, tilt head down 15-30 and move head side to side for __30__ seconds. Repeat while moving head up and down for ___30_ seconds. Perform 3 sets of each. Do _1-2___ sessions per day.

## 2023-05-01 ENCOUNTER — Ambulatory Visit: Payer: 59 | Admitting: Physical Therapy

## 2023-05-01 ENCOUNTER — Encounter: Payer: Self-pay | Admitting: Physical Therapy

## 2023-05-01 DIAGNOSIS — R2689 Other abnormalities of gait and mobility: Secondary | ICD-10-CM

## 2023-05-01 DIAGNOSIS — R2681 Unsteadiness on feet: Secondary | ICD-10-CM

## 2023-05-01 DIAGNOSIS — I69351 Hemiplegia and hemiparesis following cerebral infarction affecting right dominant side: Secondary | ICD-10-CM

## 2023-05-01 DIAGNOSIS — R42 Dizziness and giddiness: Secondary | ICD-10-CM | POA: Diagnosis not present

## 2023-05-01 NOTE — Therapy (Signed)
 OUTPATIENT PHYSICAL THERAPY NEURO TREATMENT   Patient Name: Felicia Chen MRN: [id number] DOB:1965-10-05, 58 y.o., female Today's Date: 05/01/2023   PCP: [name], PA-C  REFERRING PROVIDER: [name], MD   END OF SESSION:  PT End of Session - 05/01/23 1020     Visit Number 4    Number of Visits 9    Date for PT Re-Evaluation 06/04/23    Authorization Type [employer]    PT Start Time 1018    PT Stop Time 1058    PT Time Calculation (min) 40 min    Equipment Utilized During Treatment Gait belt    Activity Tolerance Patient tolerated treatment well    Behavior During Therapy WFL for tasks assessed/performed;Anxious             Past Medical History:  Diagnosis Date   Anxiety    Gilbert's syndrome    Past Surgical History:  Procedure Laterality Date   ABDOMINAL HYSTERECTOMY N/A 06/11/2012   Procedure: HYSTERECTOMY ABDOMINAL;  Surgeon: [name], MD;  Location: [facility];  Service: Gynecology;  Laterality: N/A;   CESAREAN SECTION     x 4   CYSTOSCOPY N/A 06/11/2012   Procedure: CYSTOSCOPY;  Surgeon: [name], MD;  Location: [facility];  Service: Gynecology;  Laterality: N/A;   HERNIA REPAIR     LAPAROSCOPIC ASSISTED VAGINAL HYSTERECTOMY N/A 06/11/2012   Procedure: LAPAROSCOPIC ASSISTED VAGINAL HYSTERECTOMY atttempted;  Surgeon: [name], MD;  Location: [facility];  Service: Gynecology;  Laterality: N/A;  Attempted Laparoscopic assisted vaginal hysterectomy.   LAPAROSCOPIC LYSIS OF ADHESIONS N/A 06/11/2012   Procedure: LAPAROSCOPIC LYSIS OF ADHESIONS;  Surgeon: [name], MD;  Location: [facility];  Service: Gynecology;  Laterality: N/A;   SALPINGOOPHORECTOMY Bilateral 06/11/2012   Procedure: SALPINGO OOPHORECTOMY;  Surgeon: [name], MD;  Location: [facility];  Service: Gynecology;  Laterality: Bilateral;   Patient Active Problem List   Diagnosis Date Noted   Generalized anxiety disorder 05/24/2022   Spasticity as late effect  of cerebrovascular accident (CVA) 10/21/2021   Abnormal gait due to muscle weakness 10/21/2021   Nausea without vomiting    Spastic hemiparesis (HCC)    History of hypertension    Neurogenic orthostatic hypotension (HCC)    Transaminitis    Right hemiplegia (HCC)    Hemorrhagic stroke (HCC)    Leucocytosis    Essential hypertension    Dyslipidemia    Right hemiparesis (HCC)    ICH (intracerebral hemorrhage) (HCC) 12/01/2020   HYPERLIPIDEMIA 07/06/2009   OBESITY 07/06/2009    ONSET DATE: 03/27/2023   REFERRING DIAG: H81.10 (ICD-10-CM) - BPPV (benign paroxysmal positional vertigo), unspecified laterality   THERAPY DIAG:  Dizziness and giddiness  Other abnormalities of gait and mobility  Hemiplegia and hemiparesis following cerebral infarction affecting right dominant side (HCC)  Unsteadiness on feet  Rationale for Evaluation and Treatment: Rehabilitation  SUBJECTIVE:  SUBJECTIVE STATEMENT: Has been working on her strength and vestibular exercises. When coming into session today, reports an episode of unsteadiness/dizziness where she had to catch herself, was turning her head to look at someone and it was in a busy environment. Woke up this morning feeling a little off. Doing a little more things for herself. Looking at getting another handrails to her stairs at home.   Pt accompanied by:  pt's daughter   PERTINENT HISTORY: PMH:  hx of R hemiplegia due to stroke in 10/22- secondary to L frontal ICH and small SAH- with associated spasticity, anxiety, Gilbert's syndrome, HLD  Per Dr. Pearlean Brownie: She presented to emergency room on 02/05/2023 with witnessed seizure activity by family.  She had been undergoing through a lot of stressful.  She unfortunately had just put down her dog that day..  She  started developing jerking and left leg subsequently it spread to involve all 4 extremities and she was unresponsive for 2 minutes and seizure activity resolved.  She was subsequently somnolent and confused and returned to her baseline.  Patient was given Keppra 1 gram loading dose and subsequently discharged on 500 mg twice daily.  MRI scan of the brain was obtained which showed no acute abnormality.  Comprehensive metabolic panel labs and CBC were fairly unremarkable.  Patient is tolerating Keppra well without any significant side effects of breakthrough seizures.   However she is states that she has had 2 minor falls and head the left side of her head and which has led to relapse of her prior benign paroxysmal positional vertigo   PAIN:  Are you having pain? No  There were no vitals filed for this visit.  PRECAUTIONS: Fall  RED FLAGS: None   WEIGHT BEARING RESTRICTIONS: No  FALLS: Has patient fallen in last 6 months? Yes. Number of falls 2  LIVING ENVIRONMENT: Lives with: lives with their family and lives with their spouse Lives in: House/apartment Stairs: Yes: Internal: 20 steps; on right going up and on left going up Has following equipment at home: Quad cane small base, SPC with 4 prong tip   PLOF: Independent with household mobility with device and Independent with community mobility with device  PATIENT GOALS: Wants to get dizziness under control and getting strength back.   OBJECTIVE:  Note: Objective measures were completed at Evaluation unless otherwise noted.  DIAGNOSTIC FINDINGS: From 02/06/23: MRI shows 1. No acute intracranial process. 2. No definite acute hemorrhage is seen to correlate with the hyperdensity noted on the 02/05/2023 CT, which may have represented mineralization from more remote hemorrhage. Per radiologist's interpretation  COGNITION: Overall cognitive status: Within functional limits for tasks assessed   SENSATION: WFL  Reports they feel heavier    COORDINATION: Heel to shin: slower to perform with RLE   MUSCLE TONE: RLE: Hypertonic   POSTURE: rounded shoulders  LOWER EXTREMITY ROM:    Decr AROM with RLE due to weakness   LOWER EXTREMITY MMT:    MMT Right Eval Left Eval  Hip flexion 4- 5  Hip extension    Hip abduction 4+ 5  Hip adduction 5 5  Hip internal rotation    Hip external rotation    Knee flexion 3- 5  Knee extension 4- (compensates with external rotation) 5  Ankle dorsiflexion 3- 5  Ankle plantarflexion    Ankle inversion    Ankle eversion    (Blank rows = not tested)  All tested in sitting    TRANSFERS: Assistive device utilized: None  Sit to stand: SBA and CGA Stand to sit: SBA and CGA Pt to need BUE or single UE support to stand,pt anxious when performing.   Pt too fearful to stand without use of UE support, performs with more narrow BOS, needing 1-2 episodes of CGA initially for balance   GAIT: Gait pattern: step to pattern, step through pattern, decreased arm swing- Right, decreased step length- Left, decreased stance time- Right, decreased stride length, decreased hip/knee flexion- Right, decreased ankle dorsiflexion- Right, and Right foot flat Distance walked: Clinic distances  Assistive device utilized: Single point cane with 4 prong tip  Level of assistance: SBA and CGA Comments: Pt guarded during gait, with decr step length with LLE and decr stance time on RLE.   FUNCTIONAL TESTS:  5 times sit to stand: 17.22 seconds, pt using single and BUE support to stand 10 meter walk test: 46.69 seconds = .70 ft/sec with SPC with 4 prong tip                                                                                                                               TREATMENT DATE:    There were no vitals filed for this visit.  Therapeutic Activity: Reviewed HEP given from previous session and technique/purpose of each exercise Discussion with pt about current functional status and pt able  to do more at home   NMR: 10 reps sit <> stands on air ex with EO, performed first 7 reps with UE support and last 3 reps without, performed 2 x 5 reps with EC (pt opening her eyes between each rep), pt needing to use BUE support to come to standing, CGA/min A for balance with pt with decr weight shift to R side, pt did improve balance with incr reps   In // bars:  On rockerboard in A/P direction: Weight shifting 15 reps with focus on hip/ankle strategy, decr ankle strategy with RLE, focus on midline positioning with equal weight shifting and using mirror as visual cue  2 x 10 reps head turns, 2 x 10 reps head nods, pt more challenged with head turns, needing intermittent UE support for balance  Alternating forward stepping strategy 2 x 10 reps with no UE support     PATIENT EDUCATION: Education details: See therapeutic activity section. Continue with HEP  Person educated: Patient and Child(ren) Education method: Explanation, Demonstration, and Verbal cues Education comprehension: verbalized understanding, returned demonstration, and verbal cues required  HOME EXERCISE PROGRAM: Seated VOR x1 in horizontal and vertical direction for 30 seconds  Access Code: [email or web address] URL: [email or web address] Date: 04/24/2023 Prepared by: Sherlie Ban  Exercises - Brandt-Daroff Vestibular Exercise  - 2 x daily - 5 x weekly - 4-5 reps - Romberg Stance with Eyes Closed  - 1 x daily - 5 x weekly - 3 sets - 30 hold - Standing Romberg to 3/4 Tandem Stance (Mirrored)  - 1 x daily - 5 x weekly - 3  sets - 30 hold - Romberg Stance with Head Rotation  - 1 x daily - 5 x weekly - 2 sets - 10 reps  GOALS: Goals reviewed with patient? Yes  SHORT TERM GOALS: Target date: 05/03/2023  Pt will be independent with initial HEP for strength, dizziness, balance in order to build upon functional gains made in therapy. Baseline: Goal status: INITIAL  2.  Further vestibular assessment to be performed  with LTG written. Baseline:  Goal status: INITIAL  3.  TUG to be assessed with LTG written.  Baseline:  Goal status: INITIAL  4.  Pt will improve 5x sit<>stand to less than or equal to 15 sec to demonstrate improved functional strength and transfer efficiency.   Baseline: 17.22 seconds, pt using single and BUE support to stand Goal status: INITIAL  5.  Pt will improve gait speed with LRAD to at least 1.1 ft/sec in order to demo decr fall risk.   Baseline: 46.69 seconds = .70 ft/sec with SPC with 4 prong tip  Goal status: INITIAL    LONG TERM GOALS: Target date: 05/31/2023  Pt will be independent with final HEP for strength, dizziness, balance in order to build upon functional gains made in therapy. Baseline:  Goal status: INITIAL  2.  Vestibular goal to be written as appropriate.  Baseline:  Goal status: INITIAL  3.  TUG goal to be written.  Baseline:  Goal status: INITIAL  4.   Pt will improve 5x sit<>stand to less than or equal to 15 sec with no UE support to demonstrate improved functional strength and transfer efficiency.  Baseline: 17.22 seconds, pt using single and BUE support to stand Goal status: INITIAL  5.   Pt will improve gait speed with LRAD to at least 1.8 ft/sec in order to demo decr fall risk.  Baseline: 46.69 seconds = .70 ft/sec with SPC with 4 prong tip  Goal status: INITIAL    ASSESSMENT:  CLINICAL IMPRESSION:  Reviewed technique for exercises for home, especially with VOR and standing balance with head motions. Pt with no further questions at this time. Pt reports she is able to do more for herself at home now. Today's skilled session focused on balance tasks with incr vestibular input/decr UE support in // bars. Pt challenged when performing sit <> stands with EC on air ex, needing CGA/min A for balance. Pt did improve with incr reps. Pt anxious when ambulating in and out of session due to busy environment. Will continue per POC.     OBJECTIVE  IMPAIRMENTS: Abnormal gait, decreased activity tolerance, decreased balance, decreased coordination, decreased endurance, decreased mobility, difficulty walking, decreased ROM, decreased strength, dizziness, increased muscle spasms, impaired flexibility, impaired tone, impaired UE functional use, and postural dysfunction.   ACTIVITY LIMITATIONS: carrying, lifting, standing, stairs, transfers, and locomotion level  PARTICIPATION LIMITATIONS: meal prep, cleaning, driving, and community activity  PERSONAL FACTORS: Age, Past/current experiences, Time since onset of injury/illness/exacerbation, and 3+ comorbidities: hx of R hemiplegia due to stroke in 10/22- secondary to L frontal ICH and small SAH- with associated spasticity, anxiety, Gilbert's syndrome, HLD, recent seizure  are also affecting patient's functional outcome.   REHAB POTENTIAL: Good  CLINICAL DECISION MAKING: Evolving/moderate complexity  EVALUATION COMPLEXITY: Moderate  PLAN:  PT FREQUENCY: 1x/week  PT DURATION: 8 weeks  PLANNED INTERVENTIONS: 97164- PT Re-evaluation, 97110-Therapeutic exercises, 97530- Therapeutic activity, 97112- Neuromuscular re-education, 97535- Self Care, 40981- Manual therapy, [phone]- Gait training, [phone]- Orthotic Fit/training, [phone]- Canalith repositioning, Patient/Family education, Balance training, Stair training,  Vestibular training, and DME instructions  PLAN FOR NEXT SESSION: CHECK STGS!!! PERFORM MSQ AND TUG AND WRITE GOALS.    Initial HEP for RLE strengthening. Work on RLE weight shifting/bearing exercises, balance exercises in corner   Drake Leach, PT, DPT 05/01/2023, 11:52 AM

## 2023-05-07 ENCOUNTER — Ambulatory Visit (INDEPENDENT_AMBULATORY_CARE_PROVIDER_SITE_OTHER): Payer: 59

## 2023-05-07 VITALS — BP 133/71 | HR 84 | Ht 68.0 in | Wt 233.0 lb

## 2023-05-07 DIAGNOSIS — R42 Dizziness and giddiness: Secondary | ICD-10-CM | POA: Diagnosis not present

## 2023-05-08 ENCOUNTER — Ambulatory Visit: Payer: 59 | Admitting: Physical Therapy

## 2023-05-08 ENCOUNTER — Encounter: Payer: Self-pay | Admitting: Physical Therapy

## 2023-05-08 DIAGNOSIS — R42 Dizziness and giddiness: Secondary | ICD-10-CM | POA: Diagnosis not present

## 2023-05-08 DIAGNOSIS — I69351 Hemiplegia and hemiparesis following cerebral infarction affecting right dominant side: Secondary | ICD-10-CM

## 2023-05-08 DIAGNOSIS — R2681 Unsteadiness on feet: Secondary | ICD-10-CM

## 2023-05-08 DIAGNOSIS — R2689 Other abnormalities of gait and mobility: Secondary | ICD-10-CM

## 2023-05-08 NOTE — Therapy (Signed)
 OUTPATIENT PHYSICAL THERAPY NEURO TREATMENT   Patient Name: Felicia Chen MRN: [id number] DOB:1965/09/12, 58 y.o., female Today's Date: 05/08/2023   PCP: [name], PA-C  REFERRING PROVIDER: [name], MD   END OF SESSION:  PT End of Session - 05/08/23 1026     Visit Number 5    Number of Visits 9    Date for PT Re-Evaluation 06/04/23    Authorization Type [employer]    PT Start Time 1024   pt late to session   PT Stop Time 1100    PT Time Calculation (min) 36 min    Equipment Utilized During Treatment Gait belt    Activity Tolerance Patient tolerated treatment well    Behavior During Therapy WFL for tasks assessed/performed;Anxious             Past Medical History:  Diagnosis Date   Anxiety    Gilbert's syndrome    Past Surgical History:  Procedure Laterality Date   ABDOMINAL HYSTERECTOMY N/A 06/11/2012   Procedure: HYSTERECTOMY ABDOMINAL;  Surgeon: [name], MD;  Location: [facility];  Service: Gynecology;  Laterality: N/A;   CESAREAN SECTION     x 4   CYSTOSCOPY N/A 06/11/2012   Procedure: CYSTOSCOPY;  Surgeon: [name], MD;  Location: [facility];  Service: Gynecology;  Laterality: N/A;   HERNIA REPAIR     LAPAROSCOPIC ASSISTED VAGINAL HYSTERECTOMY N/A 06/11/2012   Procedure: LAPAROSCOPIC ASSISTED VAGINAL HYSTERECTOMY atttempted;  Surgeon: [name], MD;  Location: [facility];  Service: Gynecology;  Laterality: N/A;  Attempted Laparoscopic assisted vaginal hysterectomy.   LAPAROSCOPIC LYSIS OF ADHESIONS N/A 06/11/2012   Procedure: LAPAROSCOPIC LYSIS OF ADHESIONS;  Surgeon: [name], MD;  Location: [facility];  Service: Gynecology;  Laterality: N/A;   SALPINGOOPHORECTOMY Bilateral 06/11/2012   Procedure: SALPINGO OOPHORECTOMY;  Surgeon: [name], MD;  Location: [facility];  Service: Gynecology;  Laterality: Bilateral;   Patient Active Problem List   Diagnosis Date Noted   Generalized anxiety disorder 05/24/2022    Spasticity as late effect of cerebrovascular accident (CVA) 10/21/2021   Abnormal gait due to muscle weakness 10/21/2021   Nausea without vomiting    Spastic hemiparesis (HCC)    History of hypertension    Neurogenic orthostatic hypotension (HCC)    Transaminitis    Right hemiplegia (HCC)    Hemorrhagic stroke (HCC)    Leucocytosis    Essential hypertension    Dyslipidemia    Right hemiparesis (HCC)    ICH (intracerebral hemorrhage) (HCC) 12/01/2020   HYPERLIPIDEMIA 07/06/2009   OBESITY 07/06/2009    ONSET DATE: 03/27/2023   REFERRING DIAG: H81.10 (ICD-10-CM) - BPPV (benign paroxysmal positional vertigo), unspecified laterality   THERAPY DIAG:  Dizziness and giddiness  Other abnormalities of gait and mobility  Hemiplegia and hemiparesis following cerebral infarction affecting right dominant side (HCC)  Unsteadiness on feet  Rationale for Evaluation and Treatment: Rehabilitation  SUBJECTIVE:  SUBJECTIVE STATEMENT: Saw Dr. Suszanne Conners yesterday. He thought dizziness was from post COVID and not moving. She thought it was a good visit.   Pt accompanied by:  pt's daughter   PERTINENT HISTORY: PMH:  hx of R hemiplegia due to stroke in 10/22- secondary to L frontal ICH and small SAH- with associated spasticity, anxiety, Gilbert's syndrome, HLD  Per Dr. Pearlean Brownie: She presented to emergency room on 02/05/2023 with witnessed seizure activity by family.  She had been undergoing through a lot of stressful.  She unfortunately had just put down her dog that day..  She started developing jerking and left leg subsequently it spread to involve all 4 extremities and she was unresponsive for 2 minutes and seizure activity resolved.  She was subsequently somnolent and confused and returned to her baseline.  Patient was  given Keppra 1 gram loading dose and subsequently discharged on 500 mg twice daily.  MRI scan of the brain was obtained which showed no acute abnormality.  Comprehensive metabolic panel labs and CBC were fairly unremarkable.  Patient is tolerating Keppra well without any significant side effects of breakthrough seizures.   However she is states that she has had 2 minor falls and head the left side of her head and which has led to relapse of her prior benign paroxysmal positional vertigo   PAIN:  Are you having pain? No  There were no vitals filed for this visit.  PRECAUTIONS: Fall  RED FLAGS: None   WEIGHT BEARING RESTRICTIONS: No  FALLS: Has patient fallen in last 6 months? Yes. Number of falls 2  LIVING ENVIRONMENT: Lives with: lives with their family and lives with their spouse Lives in: House/apartment Stairs: Yes: Internal: 20 steps; on right going up and on left going up Has following equipment at home: Quad cane small base, SPC with 4 prong tip   PLOF: Independent with household mobility with device and Independent with community mobility with device  PATIENT GOALS: Wants to get dizziness under control and getting strength back.   OBJECTIVE:  Note: Objective measures were completed at Evaluation unless otherwise noted.  DIAGNOSTIC FINDINGS: From 02/06/23: MRI shows 1. No acute intracranial process. 2. No definite acute hemorrhage is seen to correlate with the hyperdensity noted on the 02/05/2023 CT, which may have represented mineralization from more remote hemorrhage. Per radiologist's interpretation  COGNITION: Overall cognitive status: Within functional limits for tasks assessed   SENSATION: WFL  Reports they feel heavier   COORDINATION: Heel to shin: slower to perform with RLE   MUSCLE TONE: RLE: Hypertonic   POSTURE: rounded shoulders  LOWER EXTREMITY ROM:    Decr AROM with RLE due to weakness   LOWER EXTREMITY MMT:    MMT Right Eval Left Eval  Hip  flexion 4- 5  Hip extension    Hip abduction 4+ 5  Hip adduction 5 5  Hip internal rotation    Hip external rotation    Knee flexion 3- 5  Knee extension 4- (compensates with external rotation) 5  Ankle dorsiflexion 3- 5  Ankle plantarflexion    Ankle inversion    Ankle eversion    (Blank rows = not tested)  All tested in sitting    TRANSFERS: Assistive device utilized: None  Sit to stand: SBA and CGA Stand to sit: SBA and CGA Pt to need BUE or single UE support to stand,pt anxious when performing.   Pt too fearful to stand without use of UE support, performs with more narrow BOS, needing  1-2 episodes of CGA initially for balance   GAIT: Gait pattern: step to pattern, step through pattern, decreased arm swing- Right, decreased step length- Left, decreased stance time- Right, decreased stride length, decreased hip/knee flexion- Right, decreased ankle dorsiflexion- Right, and Right foot flat Distance walked: Clinic distances  Assistive device utilized: Single point cane with 4 prong tip  Level of assistance: SBA and CGA Comments: Pt guarded during gait, with decr step length with LLE and decr stance time on RLE.   FUNCTIONAL TESTS:  5 times sit to stand: 17.22 seconds, pt using single and BUE support to stand 10 meter walk test: 46.69 seconds = .70 ft/sec with SPC with 4 prong tip                                                                                                                               TREATMENT DATE:     Therapeutic Activity: Went over findings from ENT appt and performing vestibular exercises at home by pacing herself and trying not to rush through them until she is too symptomatic  Reviewed HEP and purpose of each exercise, re-starting Austin Miles as apart of HEP for habituation to return to upright   Goal Assessment: 5x sit <> stand: 19.7 seconds with no UE support, 14.3 with single and BUE support TUG: 25.8 seconds with SPC with 4 prong tip  Gait  speed: 37.8 seconds with SPC with 4 prong tip = .87 ft/sec  MOTION SENSITIVITY:  Pt reports feeling a baseline of off balance.    Motion Sensitivity Quotient Intensity: 0 = none, 1 = Lightheaded, 2 = Mild, 3 = Moderate, 4 = Severe, 5 = Vomiting  Intensity  1. Sitting to supine   2. Supine to L side   3. Supine to R side   4. Supine to sitting   5. L Hallpike-Dix (performed as L sidelying) 3  6. Up from L  3  7. R Hallpike-Dix (performed as R sidelying) 2.5  8. Up from R  3  9. Sitting, head tipped to L knee 0  10. Head up from L knee 0  11. Sitting, head tipped to R knee 0  12. Head up from R knee 0  13. Sitting head turns x5 2  14.Sitting head nods x5 1.5  15. In stance, 180 turn to L  1  16. In stance, 180 turn to R 2     PATIENT EDUCATION: Education details: See therapeutic activity section, results of goals. Continue with HEP  Person educated: Patient and Child(ren) Education method: Explanation, Demonstration, and Verbal cues Education comprehension: verbalized understanding, returned demonstration, and verbal cues required  HOME EXERCISE PROGRAM: Seated VOR x1 in horizontal and vertical direction for 30 seconds  Access Code: [email or web address] URL: [email or web address] Date: 04/24/2023 Prepared by: Sherlie Ban  Exercises - Brandt-Daroff Vestibular Exercise  - 2 x daily - 5 x weekly - 4-5 reps - Romberg Stance with Eyes Closed  -  1 x daily - 5 x weekly - 3 sets - 30 hold - Standing Romberg to 3/4 Tandem Stance (Mirrored)  - 1 x daily - 5 x weekly - 3 sets - 30 hold - Romberg Stance with Head Rotation  - 1 x daily - 5 x weekly - 2 sets - 10 reps  GOALS: Goals reviewed with patient? Yes  SHORT TERM GOALS: Target date: 05/03/2023  Pt will be independent with initial HEP for strength, dizziness, balance in order to build upon functional gains made in therapy. Baseline: Goal status: PARTIALLY MET  2.  Further vestibular assessment to be performed  with LTG written. Baseline: MSQ goal written.  Goal status: MET  3.  TUG to be assessed with LTG written.  Baseline: 25.8 seconds with SPC with 4 prong tip  Goal status: MET  4.  Pt will improve 5x sit<>stand to less than or equal to 15 sec to demonstrate improved functional strength and transfer efficiency.   Baseline: 17.22 seconds, pt using single and BUE support to stand  14.3 with single and BUE support (2/18) Goal status: MET   5.  Pt will improve gait speed with LRAD to at least 1.1 ft/sec in order to demo decr fall risk.   Baseline: 46.69 seconds = .70 ft/sec with SPC with 4 prong tip   37.8 seconds with SPC with 4 prong tip = .87 ft/sec (2/18) Goal status: NOT MET    LONG TERM GOALS: Target date: 05/31/2023  Pt will be independent with final HEP for strength, dizziness, balance in order to build upon functional gains made in therapy. Baseline:  Goal status: INITIAL  2.  Pt will report items as a 1-2/5 or less on MSQ in order to demo improved motion sensitivity.  Baseline:  Goal status: INITIAL  3.  Pt will improve TUG time to 21 seconds or less in order to demo decrease fall risk.  Baseline: 25.8 seconds with SPC with 4 prong tip  Goal status: INITIAL  4.   Pt will improve 5x sit<>stand to less than or equal to 15 sec with no UE support to demonstrate improved functional strength and transfer efficiency.  Baseline: 17.22 seconds, pt using single and BUE support to stand Goal status: INITIAL  5.   Pt will improve gait speed with LRAD to at least 1.3 ft/sec in order to demo decr fall risk.  Baseline: 46.69 seconds = .70 ft/sec with SPC with 4 prong tip   37.8 seconds with SPC with 4 prong tip = .87 ft/sec Goal status: REVISED    ASSESSMENT:  CLINICAL IMPRESSION:  Session limited today due to pt arriving late. Today's skilled session focused on assessing pt's STGs. Pt has met 3 out of 5 STGs. Pt improved 5x sit <> stand from 17.22 seconds to 14.3 seconds  with BUE support. Pt also able to perform without UE support today (previously could not do this at eval). Pt improved gait speed with SPC with 4 prong tip from .70 ft/sec > .87 ft/sec, but not quite to goal level. Assessed TUG at 25.8 seconds with SPC with 4 prong tip, indicating incr fall risk. Assessed MSQ with pt having most dizziness with sidelying <> sit, head turns, and turning to R. LTG updated as appropriate. Encouraged pt to re-start Austin Miles exercises for home for habituation. Will continue per POC.     OBJECTIVE IMPAIRMENTS: Abnormal gait, decreased activity tolerance, decreased balance, decreased coordination, decreased endurance, decreased mobility, difficulty walking, decreased ROM, decreased  strength, dizziness, increased muscle spasms, impaired flexibility, impaired tone, impaired UE functional use, and postural dysfunction.   ACTIVITY LIMITATIONS: carrying, lifting, standing, stairs, transfers, and locomotion level  PARTICIPATION LIMITATIONS: meal prep, cleaning, driving, and community activity  PERSONAL FACTORS: Age, Past/current experiences, Time since onset of injury/illness/exacerbation, and 3+ comorbidities: hx of R hemiplegia due to stroke in 10/22- secondary to L frontal ICH and small SAH- with associated spasticity, anxiety, Gilbert's syndrome, HLD, recent seizure  are also affecting patient's functional outcome.   REHAB POTENTIAL: Good  CLINICAL DECISION MAKING: Evolving/moderate complexity  EVALUATION COMPLEXITY: Moderate  PLAN:  PT FREQUENCY: 1x/week  PT DURATION: 8 weeks  PLANNED INTERVENTIONS: 97164- PT Re-evaluation, 97110-Therapeutic exercises, 97530- Therapeutic activity, [id number]- Neuromuscular re-education, 97535- Self Care, [id number]- Manual therapy, [id number]- Gait training, [phone]- Orthotic Fit/training, [phone]- Canalith repositioning, Patient/Family education, Balance training, Stair training, Vestibular training, and DME instructions  PLAN FOR NEXT SESSION:   Vestibular exercises, RLE strengthening. Work on RLE weight shifting/bearing exercises, balance exercises in corner   Drake Leach, PT, DPT 05/08/2023, 12:57 PM

## 2023-05-09 DIAGNOSIS — R42 Dizziness and giddiness: Secondary | ICD-10-CM | POA: Insufficient documentation

## 2023-05-09 NOTE — Progress Notes (Signed)
 Patient ID: Felicia Chen, female   DOB: 1965/11/07, 58 y.o.   MRN: [id number]  CC: Recurrent dizziness  HPI:  Felicia Chen is a 58 y.o. female who presents today complaining of recurrent dizziness in September 2022.  Symptoms started after she had a COVID infection and cerebrovascular accident.  As a result of the stroke, she had significant right-sided weakness.  She described her dizziness as a lightheaded and off-balance sensation.  She was treated by a physical therapist, with improvement in his symptoms.  In June 2024, she had an accidental fall.  After the fall, she started having more difficulty with her balance.  She was treated with vestibular rehab.  The patient had her second COVID infection in September 2024.  Since the second infection, she has noted increasing dizziness and nausea.  She also had an episode of seizure disorder.  She is currently seeing a neurologist.  Her MRI scan in November 2024 was negative for any intracranial lesion.  Only evidence of remote hemorrhage was noted.  Currently the patient denies any otalgia, otorrhea, spinning vertigo, or hearing difficulty.  She has no previous ENT surgery.  Past Medical History:  Diagnosis Date   Anxiety    Gilbert's syndrome     Past Surgical History:  Procedure Laterality Date   ABDOMINAL HYSTERECTOMY N/A 06/11/2012   Procedure: HYSTERECTOMY ABDOMINAL;  Surgeon: [name], MD;  Location: [facility];  Service: Gynecology;  Laterality: N/A;   CESAREAN SECTION     x 4   CYSTOSCOPY N/A 06/11/2012   Procedure: CYSTOSCOPY;  Surgeon: [name], MD;  Location: [facility];  Service: Gynecology;  Laterality: N/A;   HERNIA REPAIR     LAPAROSCOPIC ASSISTED VAGINAL HYSTERECTOMY N/A 06/11/2012   Procedure: LAPAROSCOPIC ASSISTED VAGINAL HYSTERECTOMY atttempted;  Surgeon: [name], MD;  Location: [facility];  Service: Gynecology;  Laterality: N/A;  Attempted Laparoscopic assisted vaginal hysterectomy.    LAPAROSCOPIC LYSIS OF ADHESIONS N/A 06/11/2012   Procedure: LAPAROSCOPIC LYSIS OF ADHESIONS;  Surgeon: [name], MD;  Location: [facility];  Service: Gynecology;  Laterality: N/A;   SALPINGOOPHORECTOMY Bilateral 06/11/2012   Procedure: SALPINGO OOPHORECTOMY;  Surgeon: [name], MD;  Location: [facility];  Service: Gynecology;  Laterality: Bilateral;    No family history on file.  Social History:  reports that she has never smoked. She has never used smokeless tobacco. She reports that she does not currently use alcohol. She reports that she does not use drugs.  Allergies: No Known Allergies  Prior to Admission medications   Medication Sig Start Date End Date Taking? Authorizing Provider  atorvastatin (LIPITOR) 80 MG tablet 1 tablet Orally Once a day   Yes [provider]  gabapentin (NEURONTIN) 100 MG capsule Take 200 mg by mouth at bedtime. 1 per day 03/26/22  Yes [provider]  levETIRAcetam (KEPPRA) 500 MG tablet Take 1 tablet (500 mg total) by mouth 2 (two) times daily. 03/27/23  Yes [name], MD  sertraline (ZOLOFT) 100 MG tablet Take 100 mg by mouth in the morning and at bedtime.   Yes [provider]  acetaminophen (TYLENOL) 325 MG tablet Take 2 tablets (650 mg total) by mouth every 4 (four) hours as needed for mild pain (or temp > 37.5 C (99.5 F)). Patient not taking: Reported on 05/07/2023 01/04/21   Angiulli, Mcarthur Rossetti, PA-C  ALPRAZolam Prudy Feeler) 0.5 MG tablet Take 0.5 mg by mouth 2 (two) times daily  as needed. Patient not taking: Reported on 05/07/2023 05/12/22   [provider]  baclofen (LIORESAL) 10 MG tablet TAKE 1 TABLET BY MOUTH AT BEDTIME Patient not taking: Reported on 05/07/2023 04/24/23   Lovorn, Aundra Millet, MD  COVID-19 mRNA bivalent vaccine, Moderna, (MODERNA COVID-19 BIVAL BOOSTER) 50 MCG/0.5ML injection Inject into the muscle. Patient not taking: Reported on 05/07/2023 02/17/21   Judyann Munson, MD  gabapentin (NEURONTIN) 300 MG  capsule Take 300 mg by mouth 2 (two) times daily. Patient not taking: Reported on 03/27/2023 06/28/21   [provider]  meclizine (ANTIVERT) 12.5 MG tablet Take 1 tablet (12.5 mg total) by mouth 3 (three) times daily as needed for dizziness. Patient not taking: Reported on 03/27/2023 08/23/22   Lovorn, Aundra Millet, MD  TRINTELLIX 20 MG TABS tablet Take 20 mg by mouth daily. Patient not taking: Reported on 03/27/2023 04/26/22   [provider]  zolpidem (AMBIEN) 10 MG tablet 1 tablet at bedtime as needed Orally Once a day Patient not taking: Reported on 03/27/2023 04/18/21   [provider]    Blood pressure 133/71, pulse 84, height 5\' 8"  (1.727 m), weight 233 lb (105.7 kg), SpO2 95%. Exam: General: Communicates without difficulty, well nourished, no acute distress. Head: Normocephalic, no evidence injury, no tenderness, facial buttresses intact without stepoff. Face/sinus: No tenderness to palpation and percussion. Facial movement is normal and symmetric. Eyes: PERRL, EOMI. No scleral icterus, conjunctivae clear. Neuro: CN II exam reveals vision grossly intact.  No nystagmus at any point of gaze. Ears: Auricles well formed without lesions.  Ear canals are intact without mass or lesion.  No erythema or edema is appreciated.  The TMs are intact without fluid. Nose: External evaluation reveals normal support and skin without lesions.  Dorsum is intact.  Anterior rhinoscopy reveals congested mucosa over anterior aspect of inferior turbinates and intact septum.  No purulence noted. Oral:  Oral cavity and oropharynx are intact, symmetric, without erythema or edema.  Mucosa is moist without lesions. Neck: Full range of motion without pain.  There is no significant lymphadenopathy.  No masses palpable.  Thyroid bed within normal limits to palpation.  Parotid glands and submandibular glands equal bilaterally without mass.  Trachea is midline. Neuro:  CN 2-12 grossly intact. Vestibular: No nystagmus at any  point of gaze. Dix Hallpike negative. Vestibular: There is no nystagmus with pneumatic pressure on either tympanic membrane or Valsalva. The cerebellar examination is unremarkable.    Assessment: 1.  Recurrent dizziness, likely secondary to multifactorial causes, such as sequelae from cerebrovascular accident, COVID infections, and seizure disorder.  Her MRI scan was negative for intracranial lesion. 2.  Other differential diagnoses include transient BPPV, vestibular migraine, Meniere's disease, peripheral vestibular dysfunction, or other central/systemic causes.   3.  Her ear canals, tympanic membranes, and middle ear spaces are normal.  Plan: 1.  The physical exam findings are reviewed with the patient. 2.  The pathophysiology of vestibular dysfunction and dizziness are discussed extensively with the patient. The possible differential diagnoses are reviewed. Questions are invited and answered.   3.  The patient will likely benefit from continuing her physical therapy/vestibular rehabilitation to improve the balancing function.  4.  If the patient continues to be symptomatic, she may benefit from vestibular neurodiagnostic testing at a tertiary care center to evaluate for possible vestibular dysfunction.   Aleza Pew W Arnell Mausolf 05/09/2023, 8:49 AM

## 2023-05-09 NOTE — Progress Notes (Signed)
 Kindly inform the patient that EEG or brainwave study was normal

## 2023-05-10 ENCOUNTER — Encounter: Payer: Self-pay | Admitting: Physical Therapy

## 2023-05-15 ENCOUNTER — Ambulatory Visit: Payer: 59 | Admitting: Physical Therapy

## 2023-05-22 ENCOUNTER — Ambulatory Visit: Payer: 59 | Admitting: Physical Therapy

## 2023-05-24 ENCOUNTER — Ambulatory Visit: Payer: 59 | Attending: Neurology | Admitting: Physical Therapy

## 2023-05-24 ENCOUNTER — Encounter: Payer: Self-pay | Admitting: Physical Therapy

## 2023-05-24 DIAGNOSIS — R2681 Unsteadiness on feet: Secondary | ICD-10-CM | POA: Insufficient documentation

## 2023-05-24 DIAGNOSIS — R2689 Other abnormalities of gait and mobility: Secondary | ICD-10-CM | POA: Insufficient documentation

## 2023-05-24 DIAGNOSIS — M6281 Muscle weakness (generalized): Secondary | ICD-10-CM | POA: Diagnosis present

## 2023-05-24 DIAGNOSIS — R42 Dizziness and giddiness: Secondary | ICD-10-CM | POA: Insufficient documentation

## 2023-05-24 NOTE — Therapy (Signed)
 OUTPATIENT PHYSICAL THERAPY NEURO TREATMENT   Patient Name: Felicia Chen MRN: [id number] DOB:1965/11/21, 58 y.o., female Today's Date: 05/25/2023   PCP: [name], PA-C  REFERRING PROVIDER: [name], MD   END OF SESSION:  PT End of Session - 05/24/23 1537     Visit Number 6    Number of Visits 9    Date for PT Re-Evaluation 06/04/23    Authorization Type [employer]    PT Start Time 1535   pt late to session   PT Stop Time 1615    PT Time Calculation (min) 40 min    Equipment Utilized During Treatment Gait belt    Activity Tolerance Patient tolerated treatment well    Behavior During Therapy [facility] for tasks assessed/performed;Anxious             Past Medical History:  Diagnosis Date   Anxiety    Gilbert's syndrome    Past Surgical History:  Procedure Laterality Date   ABDOMINAL HYSTERECTOMY N/A 06/11/2012   Procedure: HYSTERECTOMY ABDOMINAL;  Surgeon: [name], MD;  Location: [facility];  Service: Gynecology;  Laterality: N/A;   CESAREAN SECTION     x 4   CYSTOSCOPY N/A 06/11/2012   Procedure: CYSTOSCOPY;  Surgeon: [name], MD;  Location: [facility];  Service: Gynecology;  Laterality: N/A;   HERNIA REPAIR     LAPAROSCOPIC ASSISTED VAGINAL HYSTERECTOMY N/A 06/11/2012   Procedure: LAPAROSCOPIC ASSISTED VAGINAL HYSTERECTOMY atttempted;  Surgeon: [name], MD;  Location: [facility];  Service: Gynecology;  Laterality: N/A;  Attempted Laparoscopic assisted vaginal hysterectomy.   LAPAROSCOPIC LYSIS OF ADHESIONS N/A 06/11/2012   Procedure: LAPAROSCOPIC LYSIS OF ADHESIONS;  Surgeon: [name], MD;  Location: [facility];  Service: Gynecology;  Laterality: N/A;   SALPINGOOPHORECTOMY Bilateral 06/11/2012   Procedure: SALPINGO OOPHORECTOMY;  Surgeon: [name], MD;  Location: [facility];  Service: Gynecology;  Laterality: Bilateral;   Patient Active Problem List   Diagnosis Date Noted   Dizziness 05/09/2023   Generalized anxiety  disorder 05/24/2022   Spasticity as late effect of cerebrovascular accident (CVA) 10/21/2021   Abnormal gait due to muscle weakness 10/21/2021   Nausea without vomiting    Spastic hemiparesis (HCC)    History of hypertension    Neurogenic orthostatic hypotension (HCC)    Transaminitis    Right hemiplegia (HCC)    Hemorrhagic stroke (HCC)    Leucocytosis    Essential hypertension    Dyslipidemia    Right hemiparesis (HCC)    ICH (intracerebral hemorrhage) (HCC) 12/01/2020   HYPERLIPIDEMIA 07/06/2009   OBESITY 07/06/2009    ONSET DATE: 03/27/2023   REFERRING DIAG: H81.10 (ICD-10-CM) - BPPV (benign paroxysmal positional vertigo), unspecified laterality   THERAPY DIAG:  Dizziness and giddiness  Unsteadiness on feet  Other abnormalities of gait and mobility  Rationale for Evaluation and Treatment: Rehabilitation  SUBJECTIVE:  SUBJECTIVE STATEMENT: Took Baclofen last week for the first time in a while and it made her feel pretty bad and had to cancel her appt last week. Planning on getting a recumbent bike. Got toe separators to help with her toes. Feels dizziness is better and now doing better with her walking. Doing well with balance exercises. Wants to go over step ups exercise at the stairs. Doing more of the leg strength exercises   Pt accompanied by:  pt's [name]   PERTINENT HISTORY: PMH:  hx of R hemiplegia due to stroke in 10/22- secondary to L frontal ICH and small SAH- with associated spasticity, anxiety, Gilbert's syndrome, HLD  Per Dr. Pearlean Brownie: She presented to emergency room on 02/05/2023 with witnessed seizure activity by family.  She had been undergoing through a lot of stressful.  She unfortunately had just put down her dog that day..  She started developing jerking and left  leg subsequently it spread to involve all 4 extremities and she was unresponsive for 2 minutes and seizure activity resolved.  She was subsequently somnolent and confused and returned to her baseline.  Patient was given Keppra 1 gram loading dose and subsequently discharged on 500 mg twice daily.  MRI scan of the brain was obtained which showed no acute abnormality.  Comprehensive metabolic panel labs and CBC were fairly unremarkable.  Patient is tolerating Keppra well without any significant side effects of breakthrough seizures.   However she is states that she has had 2 minor falls and head the left side of her head and which has led to relapse of her prior benign paroxysmal positional vertigo   PAIN:  Are you having pain? No  There were no vitals filed for this visit.  PRECAUTIONS: Fall  RED FLAGS: None   WEIGHT BEARING RESTRICTIONS: No  FALLS: Has patient fallen in last 6 months? Yes. Number of falls 2  LIVING ENVIRONMENT: Lives with: lives with their family and lives with their spouse Lives in: House/apartment Stairs: Yes: Internal: 20 steps; on right going up and on left going up Has following equipment at home: Quad cane small base, SPC with 4 prong tip   PLOF: Independent with household mobility with device and Independent with community mobility with device  PATIENT GOALS: Wants to get dizziness under control and getting strength back.   OBJECTIVE:  Note: Objective measures were completed at Evaluation unless otherwise noted.  DIAGNOSTIC FINDINGS: From 02/06/23: MRI shows 1. No acute intracranial process. 2. No definite acute hemorrhage is seen to correlate with the hyperdensity noted on the 02/05/2023 CT, which may have represented mineralization from more remote hemorrhage. Per radiologist's interpretation  COGNITION: Overall cognitive status: Within functional limits for tasks assessed   SENSATION: WFL  Reports they feel heavier   COORDINATION: Heel to shin:  slower to perform with RLE   MUSCLE TONE: RLE: Hypertonic   POSTURE: rounded shoulders  LOWER EXTREMITY ROM:    Decr AROM with RLE due to weakness   LOWER EXTREMITY MMT:    MMT Right Eval Left Eval  Hip flexion 4- 5  Hip extension    Hip abduction 4+ 5  Hip adduction 5 5  Hip internal rotation    Hip external rotation    Knee flexion 3- 5  Knee extension 4- (compensates with external rotation) 5  Ankle dorsiflexion 3- 5  Ankle plantarflexion    Ankle inversion    Ankle eversion    (Blank rows = not tested)  All tested in  sitting    TRANSFERS: Assistive device utilized: None  Sit to stand: SBA and CGA Stand to sit: SBA and CGA Pt to need BUE or single UE support to stand,pt anxious when performing.   Pt too fearful to stand without use of UE support, performs with more narrow BOS, needing 1-2 episodes of CGA initially for balance   GAIT: Gait pattern: step to pattern, step through pattern, decreased arm swing- Right, decreased step length- Left, decreased stance time- Right, decreased stride length, decreased hip/knee flexion- Right, decreased ankle dorsiflexion- Right, and Right foot flat Distance walked: Clinic distances  Assistive device utilized: Single point cane with 4 prong tip  Level of assistance: SBA and CGA Comments: Pt guarded during gait, with decr step length with LLE and decr stance time on RLE.   FUNCTIONAL TESTS:  5 times sit to stand: 17.22 seconds, pt using single and BUE support to stand 10 meter walk test: 46.69 seconds = .70 ft/sec with SPC with 4 prong tip                                                                                                                               TREATMENT DATE:     NMR:  On air ex: With feet together working on [employer] in horizontal direction for saccades, 3 sets of 5 lines each. Cues to keep head still, first 2 sets performed from top to bottom, last set performed from bottom up (pt more challenged  from this direction, took incr time and had more difficulty finding place in line), incr postural sway at times, but able to maintain balance Feet wide BOS EC  2 x 5 reps head turns, cues for slowed head turns, pt with intermittent taps to wall for balance  At staircase: Pt holding on to railing and performing standing weight shift with stepping forwards and backwards with RLE and then forwards and backwards with LLE for incr weight bearing through RLE (pt demonstrated how she does these at home for stepping/weight shifting with UE support, but normally performs when holding onto countertop) Step ups (pt wanting to review these as she has not been working on these at home), holding on with LUE, performed 5 reps leading with LLE and 8 reps leading with RLE, when leading with RLE, focus on toes pointed forwards on step and with foot clearance when stepping up instead of compensatory hip flexion. Pt appreciated review as she had not been performing these at home.    Gait with SPC with 4 prong tip during session with SBA/CGA  PATIENT EDUCATION: Education details: Reviewed step up exercise for HEP for RLE>LLE strengthening, discussed progressing corner balance exercises at home starting on a compliant surface (can start with feet hip width before bringing them together) Person educated: Patient and Child(ren) Education method: Explanation, Demonstration, and Verbal cues Education comprehension: verbalized understanding, returned demonstration, and verbal cues required  HOME EXERCISE PROGRAM: Seated VOR x1 in  horizontal and vertical direction for 30 seconds  Access Code: [email or web address] URL: [email or web address] Date: 04/24/2023 Prepared by: Sherlie Ban  Progressing balance tasks to perform on foam for HEP, making sure pt performs in the corner with family supervision   Exercises - Brandt-Daroff Vestibular Exercise  - 2 x daily - 5 x weekly - 4-5 reps - Romberg Stance with Eyes  Closed  - 1 x daily - 5 x weekly - 3 sets - 30 hold - Standing Romberg to 3/4 Tandem Stance (Mirrored)  - 1 x daily - 5 x weekly - 3 sets - 30 hold - Romberg Stance with Head Rotation  - 1 x daily - 5 x weekly - 2 sets - 10 reps  GOALS: Goals reviewed with patient? Yes  SHORT TERM GOALS: Target date: 05/03/2023  Pt will be independent with initial HEP for strength, dizziness, balance in order to build upon functional gains made in therapy. Baseline: Goal status: PARTIALLY MET  2.  Further vestibular assessment to be performed with LTG written. Baseline: MSQ goal written.  Goal status: MET  3.  TUG to be assessed with LTG written.  Baseline: 25.8 seconds with SPC with 4 prong tip  Goal status: MET  4.  Pt will improve 5x sit<>stand to less than or equal to 15 sec to demonstrate improved functional strength and transfer efficiency.   Baseline: 17.22 seconds, pt using single and BUE support to stand  14.3 with single and BUE support (2/18) Goal status: MET   5.  Pt will improve gait speed with LRAD to at least 1.1 ft/sec in order to demo decr fall risk.   Baseline: 46.69 seconds = .70 ft/sec with SPC with 4 prong tip   37.8 seconds with SPC with 4 prong tip = .87 ft/sec (2/18) Goal status: NOT MET    LONG TERM GOALS: Target date: 05/31/2023  Pt will be independent with final HEP for strength, dizziness, balance in order to build upon functional gains made in therapy. Baseline:  Goal status: INITIAL  2.  Pt will report items as a 1-2/5 or less on MSQ in order to demo improved motion sensitivity.  Baseline:  Goal status: INITIAL  3.  Pt will improve TUG time to 21 seconds or less in order to demo decrease fall risk.  Baseline: 25.8 seconds with SPC with 4 prong tip  Goal status: INITIAL  4.   Pt will improve 5x sit<>stand to less than or equal to 15 sec with no UE support to demonstrate improved functional strength and transfer efficiency.  Baseline: 17.22 seconds, pt  using single and BUE support to stand Goal status: INITIAL  5.   Pt will improve gait speed with LRAD to at least 1.3 ft/sec in order to demo decr fall risk.  Baseline: 46.69 seconds = .70 ft/sec with SPC with 4 prong tip   37.8 seconds with SPC with 4 prong tip = .87 ft/sec Goal status: REVISED    ASSESSMENT:  CLINICAL IMPRESSION:  Today's skilled session focused on progressing balance tasks and working on RLE>LLE strengthening. Pt requesting to review step up exercise at staircase (pt was previously doing it as HEP, but pt reports has not done it in about 6 months). Pt able to perform at home with LUE support at staircase. Worked on horizontal saccades in standing on air ex, with pt having more difficulty finding place in [place] when going from the bottom up. Discussed progressing exercises on HEP to standing on a compliant  surface at home in the corner. Will continue per POC.     OBJECTIVE IMPAIRMENTS: Abnormal gait, decreased activity tolerance, decreased balance, decreased coordination, decreased endurance, decreased mobility, difficulty walking, decreased ROM, decreased strength, dizziness, increased muscle spasms, impaired flexibility, impaired tone, impaired UE functional use, and postural dysfunction.   ACTIVITY LIMITATIONS: carrying, lifting, standing, stairs, transfers, and locomotion level  PARTICIPATION LIMITATIONS: meal prep, cleaning, driving, and community activity  PERSONAL FACTORS: Age, Past/current experiences, Time since onset of injury/illness/exacerbation, and 3+ comorbidities: hx of R hemiplegia due to stroke in 10/22- secondary to L frontal ICH and small SAH- with associated spasticity, anxiety, Gilbert's syndrome, HLD, recent seizure  are also affecting patient's functional outcome.   REHAB POTENTIAL: Good  CLINICAL DECISION MAKING: Evolving/moderate complexity  EVALUATION COMPLEXITY: Moderate  PLAN:  PT FREQUENCY: 1x/week  PT DURATION: 8  weeks  PLANNED INTERVENTIONS: 97164- PT Re-evaluation, 97110-Therapeutic exercises, 97530- Therapeutic activity, 97112- Neuromuscular re-education, 97535- Self Care, 16109- Manual therapy, [phone]- Gait training, [phone]- Orthotic Fit/training, [phone]- Canalith repositioning, Patient/Family education, Balance training, Stair training, Vestibular training, and DME instructions  PLAN FOR NEXT SESSION:  CHECK LTGS, RE-CERT, ADD MORE VISITS   Vestibular exercises - work on horizontal saccades as these were a challenge for pt, RLE strengthening. Work on RLE weight shifting/bearing exercises, balance exercises in corner   [employer], PT, DPT 05/25/2023, 9:09 AM

## 2023-05-29 ENCOUNTER — Encounter: Payer: Self-pay | Admitting: Physical Therapy

## 2023-05-29 ENCOUNTER — Ambulatory Visit: Payer: 59 | Admitting: Physical Therapy

## 2023-05-29 VITALS — BP 111/74 | HR 99

## 2023-05-29 DIAGNOSIS — R2689 Other abnormalities of gait and mobility: Secondary | ICD-10-CM

## 2023-05-29 DIAGNOSIS — R2681 Unsteadiness on feet: Secondary | ICD-10-CM

## 2023-05-29 DIAGNOSIS — R42 Dizziness and giddiness: Secondary | ICD-10-CM | POA: Diagnosis not present

## 2023-05-29 DIAGNOSIS — M6281 Muscle weakness (generalized): Secondary | ICD-10-CM

## 2023-05-29 NOTE — Therapy (Signed)
 OUTPATIENT PHYSICAL THERAPY NEURO TREATMENT/RE-CERT   Patient Name: Felicia Chen MRN: [id number] DOB:February 13, 1966, 58 y.o., female Today's Date: 05/29/2023   PCP: [name], PA-C  REFERRING PROVIDER: [name], MD   END OF SESSION:  PT End of Session - 05/29/23 1020     Visit Number 7    Number of Visits 16    Date for PT Re-Evaluation 07/28/23   per re-cert on 7/82/95   Authorization Type [employer]    PT Start Time 1016    PT Stop Time 1058    PT Time Calculation (min) 42 min    Equipment Utilized During Treatment Gait belt    Activity Tolerance Patient tolerated treatment well   lightheadedness   Behavior During Therapy WFL for tasks assessed/performed;Anxious             Past Medical History:  Diagnosis Date   Anxiety    Gilbert's syndrome    Past Surgical History:  Procedure Laterality Date   ABDOMINAL HYSTERECTOMY N/A 06/11/2012   Procedure: HYSTERECTOMY ABDOMINAL;  Surgeon: [name], MD;  Location: [facility];  Service: Gynecology;  Laterality: N/A;   CESAREAN SECTION     x 4   CYSTOSCOPY N/A 06/11/2012   Procedure: CYSTOSCOPY;  Surgeon: [name], MD;  Location: [facility];  Service: Gynecology;  Laterality: N/A;   HERNIA REPAIR     LAPAROSCOPIC ASSISTED VAGINAL HYSTERECTOMY N/A 06/11/2012   Procedure: LAPAROSCOPIC ASSISTED VAGINAL HYSTERECTOMY atttempted;  Surgeon: [name], MD;  Location: [facility];  Service: Gynecology;  Laterality: N/A;  Attempted Laparoscopic assisted vaginal hysterectomy.   LAPAROSCOPIC LYSIS OF ADHESIONS N/A 06/11/2012   Procedure: LAPAROSCOPIC LYSIS OF ADHESIONS;  Surgeon: [name], MD;  Location: [facility];  Service: Gynecology;  Laterality: N/A;   SALPINGOOPHORECTOMY Bilateral 06/11/2012   Procedure: SALPINGO OOPHORECTOMY;  Surgeon: [name], MD;  Location: [facility];  Service: Gynecology;  Laterality: Bilateral;   Patient Active Problem List   Diagnosis Date Noted   Dizziness 05/09/2023    Generalized anxiety disorder 05/24/2022   Spasticity as late effect of cerebrovascular accident (CVA) 10/21/2021   Abnormal gait due to muscle weakness 10/21/2021   Nausea without vomiting    Spastic hemiparesis (HCC)    History of hypertension    Neurogenic orthostatic hypotension (HCC)    Transaminitis    Right hemiplegia (HCC)    Hemorrhagic stroke (HCC)    Leucocytosis    Essential hypertension    Dyslipidemia    Right hemiparesis (HCC)    ICH (intracerebral hemorrhage) (HCC) 12/01/2020   HYPERLIPIDEMIA 07/06/2009   OBESITY 07/06/2009    ONSET DATE: 03/27/2023   REFERRING DIAG: H81.10 (ICD-10-CM) - BPPV (benign paroxysmal positional vertigo), unspecified laterality   THERAPY DIAG:  Dizziness and giddiness  Unsteadiness on feet  Other abnormalities of gait and mobility  Muscle weakness (generalized)  Rationale for Evaluation and Treatment: Rehabilitation  SUBJECTIVE:  SUBJECTIVE STATEMENT: Wants to keep working on strengthening. Notes having some dizziness with head turns. Does see improvement. Got the railing on the other side of the stairs. Has noticed in her stamina   Pt accompanied by:  pt's [name]   PERTINENT HISTORY: PMH:  hx of R hemiplegia due to stroke in 10/22- secondary to L frontal ICH and small SAH- with associated spasticity, anxiety, Gilbert's syndrome, HLD  Per Dr. Pearlean Brownie: She presented to emergency room on 02/05/2023 with witnessed seizure activity by family.  She had been undergoing through a lot of stressful.  She unfortunately had just put down her dog that day..  She started developing jerking and left leg subsequently it spread to involve all 4 extremities and she was unresponsive for 2 minutes and seizure activity resolved.  She was subsequently  somnolent and confused and returned to her baseline.  Patient was given Keppra 1 gram loading dose and subsequently discharged on 500 mg twice daily.  MRI scan of the brain was obtained which showed no acute abnormality.  Comprehensive metabolic panel labs and CBC were fairly unremarkable.  Patient is tolerating Keppra well without any significant side effects of breakthrough seizures.   However she is states that she has had 2 minor falls and head the left side of her head and which has led to relapse of her prior benign paroxysmal positional vertigo   PAIN:  Are you having pain? No  Vitals:   05/29/23 1044 05/29/23 1047  BP: 135/72 111/74  Pulse: 86 99    PRECAUTIONS: Fall  RED FLAGS: None   WEIGHT BEARING RESTRICTIONS: No  FALLS: Has patient fallen in last 6 months? Yes. Number of falls 2  LIVING ENVIRONMENT: Lives with: lives with their family and lives with their spouse Lives in: House/apartment Stairs: Yes: Internal: 20 steps; on right going up and on left going up Has following equipment at home: Quad cane small base, SPC with 4 prong tip   PLOF: Independent with household mobility with device and Independent with community mobility with device  PATIENT GOALS: Wants to get dizziness under control and getting strength back.   OBJECTIVE:  Note: Objective measures were completed at Evaluation unless otherwise noted.  DIAGNOSTIC FINDINGS: From 02/06/23: MRI shows 1. No acute intracranial process. 2. No definite acute hemorrhage is seen to correlate with the hyperdensity noted on the 02/05/2023 CT, which may have represented mineralization from more remote hemorrhage. Per radiologist's interpretation  COGNITION: Overall cognitive status: Within functional limits for tasks assessed   SENSATION: WFL  Reports they feel heavier   COORDINATION: Heel to shin: slower to perform with RLE   MUSCLE TONE: RLE: Hypertonic   POSTURE: rounded shoulders  LOWER EXTREMITY ROM:     Decr AROM with RLE due to weakness   LOWER EXTREMITY MMT:    MMT Right Eval Left Eval  Hip flexion 4- 5  Hip extension    Hip abduction 4+ 5  Hip adduction 5 5  Hip internal rotation    Hip external rotation    Knee flexion 3- 5  Knee extension 4- (compensates with external rotation) 5  Ankle dorsiflexion 3- 5  Ankle plantarflexion    Ankle inversion    Ankle eversion    (Blank rows = not tested)  All tested in sitting    TRANSFERS: Assistive device utilized: None  Sit to stand: SBA and CGA Stand to sit: SBA and CGA Pt to need BUE or single UE support to stand,pt anxious  when performing.   Pt too fearful to stand without use of UE support, performs with more narrow BOS, needing 1-2 episodes of CGA initially for balance   GAIT: Gait pattern: step to pattern, step through pattern, decreased arm swing- Right, decreased step length- Left, decreased stance time- Right, decreased stride length, decreased hip/knee flexion- Right, decreased ankle dorsiflexion- Right, and Right foot flat Distance walked: Clinic distances  Assistive device utilized: Single point cane with 4 prong tip  Level of assistance: SBA and CGA Comments: Pt guarded during gait, with decr step length with LLE and decr stance time on RLE.   FUNCTIONAL TESTS:  5 times sit to stand: 17.22 seconds, pt using single and BUE support to stand 10 meter walk test: 46.69 seconds = .70 ft/sec with SPC with 4 prong tip                                                                                                                               TREATMENT DATE:     Therapeutic Activity:                Motion Sensitivity Quotient Intensity: 0 = none, 1 = Lightheaded, 2 = Mild, 3 = Moderate, 4 = Severe, 5 = Vomiting   Intensity (2/18) 3/11   1. Sitting to supine     2. Supine to L side     3. Supine to R side     4. Supine to sitting     5. L Hallpike-Dix (performed as L sidelying) 3 0  6. Up from L  3 3  7. R  Hallpike-Dix (performed as R sidelying) 2.5 0  8. Up from R  3 3  9. Sitting, head tipped to L knee 0   10. Head up from L knee 0   11. Sitting, head tipped to R knee 0   12. Head up from R knee 0   13. Sitting head turns x5 2 3  14.Sitting head nods x5 1.5 2  15. In stance, 180 turn to L  1 1  16. In stance, 180 turn to R 2 1      Goal Assessment: 5x sit <> stand: 18.7 seconds with no UE support, 12.7 seconds with single UE support   Gait speed: attempted to assess, but pt got more lightheaded and  PT brought a chair over for pt to sit down in    Vitals:   05/29/23 1044 05/29/23 1047  BP: 135/72 111/74  Pulse: 86 99  Sitting, Standing  Pt reporting lightheadedness in standing. Pt with a drop in systolic BP today (previously when assessed orthostatics in earlier session, pt with no orthostatics). Pt reporting that she did not drink enough water today. Provided pt with water. Educated on importance of drinking water, taking time with transitional movements, and performing seated marching or ankle pumps before standing. Also discussed potential use of compression socks (just trying to ankles)  to see if that would help with lightheadedness in standing. Also discussed with pt to talk about this with her PCP.    Gait with SPC with 4 prong tip during session with SBA/CGA  PATIENT EDUCATION: Education details: Discussed results of goals, POC going forwards and will continue to focus on balance and RLE strength and adding more PT appts, see above for orthostatics information  Person educated: Patient and Child(ren) Education method: Explanation, Demonstration, and Verbal cues Education comprehension: verbalized understanding, returned demonstration, and verbal cues required  HOME EXERCISE PROGRAM: Seated VOR x1 in horizontal and vertical direction for 30 seconds  Access Code: [email or web address] URL: [email or web address] Date: 04/24/2023 Prepared by: Sherlie Ban  Progressing balance tasks to perform on foam for HEP, making sure pt performs in the corner with family supervision   Exercises - Brandt-Daroff Vestibular Exercise  - 2 x daily - 5 x weekly - 4-5 reps - Romberg Stance with Eyes Closed  - 1 x daily - 5 x weekly - 3 sets - 30 hold - Standing Romberg to 3/4 Tandem Stance (Mirrored)  - 1 x daily - 5 x weekly - 3 sets - 30 hold - Romberg Stance with Head Rotation  - 1 x daily - 5 x weekly - 2 sets - 10 reps  GOALS: Goals reviewed with patient? Yes  SHORT TERM GOALS: Target date: 05/03/2023  Pt will be independent with initial HEP for strength, dizziness, balance in order to build upon functional gains made in therapy. Baseline: Goal status: PARTIALLY MET  2.  Further vestibular assessment to be performed with LTG written. Baseline: MSQ goal written.  Goal status: MET  3.  TUG to be assessed with LTG written.  Baseline: 25.8 seconds with SPC with 4 prong tip  Goal status: MET  4.  Pt will improve 5x sit<>stand to less than or equal to 15 sec to demonstrate improved functional strength and transfer efficiency.   Baseline: 17.22 seconds, pt using single and BUE support to stand  14.3 with single and BUE support (2/18) Goal status: MET   5.  Pt will improve gait speed with LRAD to at least 1.1 ft/sec in order to demo decr fall risk.   Baseline: 46.69 seconds = .70 ft/sec with SPC with 4 prong tip   37.8 seconds with SPC with 4 prong tip = .87 ft/sec (2/18) Goal status: NOT MET    LONG TERM GOALS: Target date: 05/31/2023  Pt will be independent with final HEP for strength, dizziness, balance in order to build upon functional gains made in therapy. Baseline:  Goal status: MET   2.  Pt will report items as a 1-2/5 or less on MSQ in order to demo improved motion sensitivity.  Baseline: see chart above on 3/11  Goal status: PARTIALLY MET  3.  Pt will improve TUG time to 21 seconds or less in order to demo decrease fall  risk.  Baseline: 25.8 seconds with SPC with 4 prong tip  Goal status: INITIAL  4.   Pt will improve 5x sit<>stand to less than or equal to 15 sec with no UE support to demonstrate improved functional strength and transfer efficiency.  Baseline: 17.22 seconds, pt using single and BUE support to stand  12.7 seconds with single UE support (3/11) Goal status: MET  5.   Pt will improve gait speed with LRAD to at least 1.3 ft/sec in order to demo decr fall risk.  Baseline: 46.69 seconds = .70 ft/sec with SPC with  4 prong tip   37.8 seconds with SPC with 4 prong tip = .87 ft/sec Goal status: REVISED   UPDATED STGS/LTGS FOR RE-CERT SHORT TERM GOALS: Target date:06/26/2023  1.  Pt will improve TUG time to 23 seconds or less in order to demo decrease fall risk.  Baseline: 25.8 seconds with SPC with 4 prong tip  Goal status: INITIAL  2.   Pt will improve gait speed with LRAD to at least 1.1 ft/sec in order to demo decr fall risk.  Baseline: 46.69 seconds = .70 ft/sec with SPC with 4 prong tip   37.8 seconds with SPC with 4 prong tip = .87 ft/sec Goal status: INITIAL     LONG TERM GOALS: Target date: 07/24/2023  Pt will be independent with final HEP for strength, dizziness, balance in order to build upon functional gains made in therapy. Baseline: will benefit from updates/additions as appropriate Goal status: ON-GOING  2.  Pt will report items as a 1-2/5 or less on MSQ in order to demo improved motion sensitivity.  Baseline: see chart above on 3/11  Goal status: ON-GOING  3.  Pt will improve TUG time to 19 seconds or less in order to demo decrease fall risk.  Baseline: 25.8 seconds with SPC with 4 prong tip  Goal status: REVISED  4.   Pt will improve 5x sit<>stand to less than or equal to 15 sec with no UE support to demonstrate improved functional strength and transfer efficiency.  Baseline: 18.7 seconds with no UE support Goal status: INITIAL  5.   Pt will improve gait speed  with LRAD to at least 1.3 ft/sec in order to demo decr fall risk.  Baseline: 46.69 seconds = .70 ft/sec with SPC with 4 prong tip   37.8 seconds with SPC with 4 prong tip = .87 ft/sec Goal status: INITIAL    ASSESSMENT:  CLINICAL IMPRESSION:  Today's skilled session focused on assessing LTGs for re-cert. Pt met LTG #4 today with 5x sit <> stand, able to decr time to 12.7 seconds with single UE support. Pt partially met goal for MSQ, pt still reporting more dizziness with supine>sit and with head motions. Attempted to perform gait speed with cane, but stopped halfway through due to pt having more lightheadedness today. Pt did demo a drop in her systolic BP that could be contributing to pt's lightheadedness today. Educated on orthostatics and maybe trying some gentle compression stockings to see if that helps with gait. Pt will continue to benefit from skilled PT to decr fall risk, address gait/balance/strength, and decr dizziness to improve functional mobility and independence. STGs/LTGs updated as appropriate.  OBJECTIVE IMPAIRMENTS: Abnormal gait, decreased activity tolerance, decreased balance, decreased coordination, decreased endurance, decreased mobility, difficulty walking, decreased ROM, decreased strength, dizziness, increased muscle spasms, impaired flexibility, impaired tone, impaired UE functional use, and postural dysfunction.   ACTIVITY LIMITATIONS: carrying, lifting, standing, stairs, transfers, and locomotion level  PARTICIPATION LIMITATIONS: meal prep, cleaning, driving, and community activity  PERSONAL FACTORS: Age, Past/current experiences, Time since onset of injury/illness/exacerbation, and 3+ comorbidities: hx of R hemiplegia due to stroke in 10/22- secondary to L frontal ICH and small SAH- with associated spasticity, anxiety, Gilbert's syndrome, HLD, recent seizure  are also affecting patient's functional outcome.   REHAB POTENTIAL: Good  CLINICAL DECISION MAKING:  Evolving/moderate complexity  EVALUATION COMPLEXITY: Moderate  PLAN:  PT FREQUENCY: 1x/week  PT DURATION: 8 weeks  PLANNED INTERVENTIONS: [id number]- PT Re-evaluation, 97110-Therapeutic exercises, 97530- Therapeutic activity, [id number]- Neuromuscular re-education, 97535- Self  Care, [id number]- Manual therapy, [phone]- Gait training, [phone]- Orthotic Fit/training, [phone]- Canalith repositioning, Patient/Family education, Balance training, Stair training, Vestibular training, and DME instructions  PLAN FOR NEXT SESSION:   Vestibular exercises - work on horizontal saccades as these were a challenge for pt, RLE strengthening. Work on RLE weight shifting/bearing exercises, balance exercises in corner   Drake Leach, PT, DPT 05/29/2023, 3:38 PM

## 2023-06-05 ENCOUNTER — Encounter: Payer: Self-pay | Admitting: Physical Therapy

## 2023-06-05 ENCOUNTER — Ambulatory Visit: Admitting: Physical Therapy

## 2023-06-05 DIAGNOSIS — R2689 Other abnormalities of gait and mobility: Secondary | ICD-10-CM

## 2023-06-05 DIAGNOSIS — M6281 Muscle weakness (generalized): Secondary | ICD-10-CM

## 2023-06-05 DIAGNOSIS — R2681 Unsteadiness on feet: Secondary | ICD-10-CM

## 2023-06-05 DIAGNOSIS — R42 Dizziness and giddiness: Secondary | ICD-10-CM | POA: Diagnosis not present

## 2023-06-05 NOTE — Therapy (Signed)
 OUTPATIENT PHYSICAL THERAPY NEURO TREATMENT  Patient Name: Felicia Chen MRN: [id number] DOB:[birth year], 58 y.o., female Today's Date: 06/05/2023   PCP: [name], PA-C  REFERRING PROVIDER: [name], MD   END OF SESSION:  PT End of Session - 06/05/23 1452     Visit Number 8    Number of Visits 16    Date for PT Re-Evaluation 07/28/23   per re-cert on 06/27/79   Authorization Type [employer]    PT Start Time 1450   pt late to session   PT Stop Time 1530    PT Time Calculation (min) 40 min    Equipment Utilized During Treatment Gait belt    Activity Tolerance Patient tolerated treatment well    Behavior During Therapy [facility] for tasks assessed/performed;Anxious             Past Medical History:  Diagnosis Date   Anxiety    Gilbert's syndrome    Past Surgical History:  Procedure Laterality Date   ABDOMINAL HYSTERECTOMY N/A 06/11/2012   Procedure: HYSTERECTOMY ABDOMINAL;  Surgeon: [name], MD;  Location: [facility];  Service: Gynecology;  Laterality: N/A;   CESAREAN SECTION     x 4   CYSTOSCOPY N/A 06/11/2012   Procedure: CYSTOSCOPY;  Surgeon: [name], MD;  Location: [facility];  Service: Gynecology;  Laterality: N/A;   HERNIA REPAIR     LAPAROSCOPIC ASSISTED VAGINAL HYSTERECTOMY N/A 06/11/2012   Procedure: LAPAROSCOPIC ASSISTED VAGINAL HYSTERECTOMY atttempted;  Surgeon: [name], MD;  Location: [facility];  Service: Gynecology;  Laterality: N/A;  Attempted Laparoscopic assisted vaginal hysterectomy.   LAPAROSCOPIC LYSIS OF ADHESIONS N/A 06/11/2012   Procedure: LAPAROSCOPIC LYSIS OF ADHESIONS;  Surgeon: [name], MD;  Location: [facility];  Service: Gynecology;  Laterality: N/A;   SALPINGOOPHORECTOMY Bilateral 06/11/2012   Procedure: SALPINGO OOPHORECTOMY;  Surgeon: [name], MD;  Location: [facility];  Service: Gynecology;  Laterality: Bilateral;   Patient Active Problem List   Diagnosis Date Noted   Dizziness 05/09/2023    Generalized anxiety disorder 05/24/2022   Spasticity as late effect of cerebrovascular accident (CVA) 10/21/2021   Abnormal gait due to muscle weakness 10/21/2021   Nausea without vomiting    Spastic hemiparesis (HCC)    History of hypertension    Neurogenic orthostatic hypotension (HCC)    Transaminitis    Right hemiplegia (HCC)    Hemorrhagic stroke (HCC)    Leucocytosis    Essential hypertension    Dyslipidemia    Right hemiparesis (HCC)    ICH (intracerebral hemorrhage) (HCC) 12/01/2020   HYPERLIPIDEMIA 07/06/2009   OBESITY 07/06/2009    ONSET DATE: 03/27/2023   REFERRING DIAG: H81.10 (ICD-10-CM) - BPPV (benign paroxysmal positional vertigo), unspecified laterality   THERAPY DIAG:  Dizziness and giddiness  Unsteadiness on feet  Other abnormalities of gait and mobility  Muscle weakness (generalized)  Rationale for Evaluation and Treatment: Rehabilitation  SUBJECTIVE:  SUBJECTIVE STATEMENT: Have been able to move her toes better. Dizziness has been not too bad. Wants to work on strengthening her legs and wants to have more stability.   Pt accompanied by:  pt's [name]   PERTINENT HISTORY: PMH:  hx of R hemiplegia due to stroke in 10/22- secondary to L frontal ICH and small SAH- with associated spasticity, anxiety, Gilbert's syndrome, HLD  Per Dr. Pearlean Brownie: She presented to emergency room on 02/05/2023 with witnessed seizure activity by family.  She had been undergoing through a lot of stressful.  She unfortunately had just put down her dog that day..  She started developing jerking and left leg subsequently it spread to involve all 4 extremities and she was unresponsive for 2 minutes and seizure activity resolved.  She was subsequently somnolent and confused and returned to her  baseline.  Patient was given Keppra 1 gram loading dose and subsequently discharged on 500 mg twice daily.  MRI scan of the brain was obtained which showed no acute abnormality.  Comprehensive metabolic panel labs and CBC were fairly unremarkable.  Patient is tolerating Keppra well without any significant side effects of breakthrough seizures.   However she is states that she has had 2 minor falls and head the left side of her head and which has led to relapse of her prior benign paroxysmal positional vertigo   PAIN:  Are you having pain? No  There were no vitals filed for this visit.   PRECAUTIONS: Fall  RED FLAGS: None   WEIGHT BEARING RESTRICTIONS: No  FALLS: Has patient fallen in last 6 months? Yes. Number of falls 2  LIVING ENVIRONMENT: Lives with: lives with their family and lives with their spouse Lives in: House/apartment Stairs: Yes: Internal: 20 steps; on right going up and on left going up Has following equipment at home: Quad cane small base, SPC with 4 prong tip   PLOF: Independent with household mobility with device and Independent with community mobility with device  PATIENT GOALS: Wants to get dizziness under control and getting strength back.   OBJECTIVE:  Note: Objective measures were completed at Evaluation unless otherwise noted.  DIAGNOSTIC FINDINGS: From 02/06/23: MRI shows 1. No acute intracranial process. 2. No definite acute hemorrhage is seen to correlate with the hyperdensity noted on the 02/05/2023 CT, which may have represented mineralization from more remote hemorrhage. Per radiologist's interpretation  COGNITION: Overall cognitive status: Within functional limits for tasks assessed   SENSATION: WFL  Reports they feel heavier   COORDINATION: Heel to shin: slower to perform with RLE   MUSCLE TONE: RLE: Hypertonic   POSTURE: rounded shoulders  LOWER EXTREMITY ROM:    Decr AROM with RLE due to weakness   LOWER EXTREMITY MMT:    MMT  Right Eval Left Eval  Hip flexion 4- 5  Hip extension    Hip abduction 4+ 5  Hip adduction 5 5  Hip internal rotation    Hip external rotation    Knee flexion 3- 5  Knee extension 4- (compensates with external rotation) 5  Ankle dorsiflexion 3- 5  Ankle plantarflexion    Ankle inversion    Ankle eversion    (Blank rows = not tested)  All tested in sitting    TRANSFERS: Assistive device utilized: None  Sit to stand: SBA and CGA Stand to sit: SBA and CGA Pt to need BUE or single UE support to stand,pt anxious when performing.   Pt too fearful to stand without use of  UE support, performs with more narrow BOS, needing 1-2 episodes of CGA initially for balance   GAIT: Gait pattern: step to pattern, step through pattern, decreased arm swing- Right, decreased step length- Left, decreased stance time- Right, decreased stride length, decreased hip/knee flexion- Right, decreased ankle dorsiflexion- Right, and Right foot flat Distance walked: Clinic distances  Assistive device utilized: Single point cane with 4 prong tip  Level of assistance: SBA and CGA Comments: Pt guarded during gait, with decr step length with LLE and decr stance time on RLE.   FUNCTIONAL TESTS:  5 times sit to stand: 17.22 seconds, pt using single and BUE support to stand 10 meter walk test: 46.69 seconds = .70 ft/sec with SPC with 4 prong tip                                                                                                                               TREATMENT DATE:     Therapeutic Exercise:  Bridging with hip ADD ball squeeze 2 x 10 reps, PT needing to help keep RLE in place  2 x 10 reps clamshells with red t-band with RLE Slow seated marching with red t-band 10 reps each side  SciFit with BUE/BLE at Gear 3.0 [place] for 8 minutes for strengthening, ROM, reciprocal movement patterns. Cues for hip ADD with RLE, pt reporting RPE as 5-6/10  Gait with SPC with 4 prong tip during session  with SBA/CGA  PATIENT EDUCATION: Education details: Added above exercises for strengthening, working at a moderate intensity on bike at home  Person educated: Patient and Child(ren) Education method: Explanation, Demonstration, and Verbal cues Education comprehension: verbalized understanding, returned demonstration, and verbal cues required  HOME EXERCISE PROGRAM: Seated VOR x1 in horizontal and vertical direction for 30 seconds  Access Code: [email or web address] URL: [email or web address] Date: 04/24/2023 Prepared by: Sherlie Ban  Progressing balance tasks to perform on foam for HEP, making sure pt performs in the corner with family supervision   Exercises - Brandt-Daroff Vestibular Exercise  - 2 x daily - 5 x weekly - 4-5 reps - Romberg Stance with Eyes Closed  - 1 x daily - 5 x weekly - 3 sets - 30 hold - Standing Romberg to 3/4 Tandem Stance (Mirrored)  - 1 x daily - 5 x weekly - 3 sets - 30 hold - Romberg Stance with Head Rotation  - 1 x daily - 5 x weekly - 2 sets - 10 reps  GOALS: Goals reviewed with patient? Yes    LONG TERM GOALS: Target date: 05/31/2023  Pt will be independent with final HEP for strength, dizziness, balance in order to build upon functional gains made in therapy. Baseline:  Goal status: MET   2.  Pt will report items as a 1-2/5 or less on MSQ in order to demo improved motion sensitivity.  Baseline: see chart above on 3/11  Goal status: PARTIALLY MET  3.  Pt will improve TUG time  to 21 seconds or less in order to demo decrease fall risk.  Baseline: 25.8 seconds with SPC with 4 prong tip  Goal status: INITIAL  4.   Pt will improve 5x sit<>stand to less than or equal to 15 sec with no UE support to demonstrate improved functional strength and transfer efficiency.  Baseline: 17.22 seconds, pt using single and BUE support to stand  12.7 seconds with single UE support (3/11) Goal status: MET  5.   Pt will improve gait speed with LRAD to at  least 1.3 ft/sec in order to demo decr fall risk.  Baseline: 46.69 seconds = .70 ft/sec with SPC with 4 prong tip   37.8 seconds with SPC with 4 prong tip = .87 ft/sec Goal status: REVISED   UPDATED STGS/LTGS FOR RE-CERT SHORT TERM GOALS: Target date:06/26/2023  1.  Pt will improve TUG time to 23 seconds or less in order to demo decrease fall risk.  Baseline: 25.8 seconds with SPC with 4 prong tip  Goal status: INITIAL  2.   Pt will improve gait speed with LRAD to at least 1.1 ft/sec in order to demo decr fall risk.  Baseline: 46.69 seconds = .70 ft/sec with SPC with 4 prong tip   37.8 seconds with SPC with 4 prong tip = .87 ft/sec Goal status: INITIAL     LONG TERM GOALS: Target date: 07/24/2023  Pt will be independent with final HEP for strength, dizziness, balance in order to build upon functional gains made in therapy. Baseline: will benefit from updates/additions as appropriate Goal status: ON-GOING  2.  Pt will report items as a 1-2/5 or less on MSQ in order to demo improved motion sensitivity.  Baseline: see chart above on 3/11  Goal status: ON-GOING  3.  Pt will improve TUG time to 19 seconds or less in order to demo decrease fall risk.  Baseline: 25.8 seconds with SPC with 4 prong tip  Goal status: REVISED  4.   Pt will improve 5x sit<>stand to less than or equal to 15 sec with no UE support to demonstrate improved functional strength and transfer efficiency.  Baseline: 18.7 seconds with no UE support Goal status: INITIAL  5.   Pt will improve gait speed with LRAD to at least 1.3 ft/sec in order to demo decr fall risk.  Baseline: 46.69 seconds = .70 ft/sec with SPC with 4 prong tip   37.8 seconds with SPC with 4 prong tip = .87 ft/sec Goal status: INITIAL    ASSESSMENT:  CLINICAL IMPRESSION:  Today's skilled session focused on BLE strengthening exercises, esp working on RLE. Reviewed exercises given in previous bout of therapy for strengthening and also  added resistance band (red) to clamshells and seated hip flexion. Remainder of session focused on SciFit for strength and reciprocal movement patterns. Pt reporting RPE as 5-6/10 afterwards. Will continue per POC.   OBJECTIVE IMPAIRMENTS: Abnormal gait, decreased activity tolerance, decreased balance, decreased coordination, decreased endurance, decreased mobility, difficulty walking, decreased ROM, decreased strength, dizziness, increased muscle spasms, impaired flexibility, impaired tone, impaired UE functional use, and postural dysfunction.   ACTIVITY LIMITATIONS: carrying, lifting, standing, stairs, transfers, and locomotion level  PARTICIPATION LIMITATIONS: meal prep, cleaning, driving, and community activity  PERSONAL FACTORS: Age, Past/current experiences, Time since onset of injury/illness/exacerbation, and 3+ comorbidities: hx of R hemiplegia due to stroke in 10/22- secondary to L frontal ICH and small SAH- with associated spasticity, anxiety, Gilbert's syndrome, HLD, recent seizure  are also affecting patient's functional outcome.  REHAB POTENTIAL: Good  CLINICAL DECISION MAKING: Evolving/moderate complexity  EVALUATION COMPLEXITY: Moderate  PLAN:  PT FREQUENCY: 1x/week  PT DURATION: 8 weeks  PLANNED INTERVENTIONS: 97164- PT Re-evaluation, 97110-Therapeutic exercises, 97530- Therapeutic activity, 97112- Neuromuscular re-education, 97535- Self Care, 62952- Manual therapy, [phone]- Gait training, [phone]- Orthotic Fit/training, [phone]- Canalith repositioning, Patient/Family education, Balance training, Stair training, Vestibular training, and DME instructions  PLAN FOR NEXT SESSION:   Vestibular exercises - work on horizontal saccades as these were a challenge for pt, RLE strengthening. Work on RLE weight shifting/bearing exercises, balance exercises in corner   Drake Leach, PT, DPT 06/05/2023, 3:47 PM

## 2023-06-12 ENCOUNTER — Ambulatory Visit: Admitting: Physical Therapy

## 2023-06-12 ENCOUNTER — Encounter: Payer: Self-pay | Admitting: Physical Therapy

## 2023-06-12 DIAGNOSIS — R2689 Other abnormalities of gait and mobility: Secondary | ICD-10-CM

## 2023-06-12 DIAGNOSIS — R42 Dizziness and giddiness: Secondary | ICD-10-CM | POA: Diagnosis not present

## 2023-06-12 DIAGNOSIS — R2681 Unsteadiness on feet: Secondary | ICD-10-CM

## 2023-06-12 NOTE — Therapy (Signed)
 OUTPATIENT PHYSICAL THERAPY NEURO TREATMENT  Patient Name: Felicia Chen MRN: [id number] DOB:23-Aug-1965, 58 y.o., female Today's Date: 06/12/2023   PCP: [name], PA-C  REFERRING PROVIDER: [name], MD   END OF SESSION:  PT End of Session - 06/12/23 1152     Visit Number 9    Number of Visits 16    Date for PT Re-Evaluation 07/28/23   per re-cert on 7/56/43   Authorization Type [employer]    PT Start Time 1149   pt late to session   PT Stop Time 1230    PT Time Calculation (min) 41 min    Equipment Utilized During Treatment Gait belt    Activity Tolerance Patient tolerated treatment well    Behavior During Therapy [facility] for tasks assessed/performed;Anxious             Past Medical History:  Diagnosis Date   Anxiety    Gilbert's syndrome    Past Surgical History:  Procedure Laterality Date   ABDOMINAL HYSTERECTOMY N/A 06/11/2012   Procedure: HYSTERECTOMY ABDOMINAL;  Surgeon: [name], MD;  Location: [facility];  Service: Gynecology;  Laterality: N/A;   CESAREAN SECTION     x 4   CYSTOSCOPY N/A 06/11/2012   Procedure: CYSTOSCOPY;  Surgeon: [name], MD;  Location: [facility];  Service: Gynecology;  Laterality: N/A;   HERNIA REPAIR     LAPAROSCOPIC ASSISTED VAGINAL HYSTERECTOMY N/A 06/11/2012   Procedure: LAPAROSCOPIC ASSISTED VAGINAL HYSTERECTOMY atttempted;  Surgeon: [name], MD;  Location: [facility];  Service: Gynecology;  Laterality: N/A;  Attempted Laparoscopic assisted vaginal hysterectomy.   LAPAROSCOPIC LYSIS OF ADHESIONS N/A 06/11/2012   Procedure: LAPAROSCOPIC LYSIS OF ADHESIONS;  Surgeon: [name], MD;  Location: [facility];  Service: Gynecology;  Laterality: N/A;   SALPINGOOPHORECTOMY Bilateral 06/11/2012   Procedure: SALPINGO OOPHORECTOMY;  Surgeon: [name], MD;  Location: [facility];  Service: Gynecology;  Laterality: Bilateral;   Patient Active Problem List   Diagnosis Date Noted   Dizziness 05/09/2023    Generalized anxiety disorder 05/24/2022   Spasticity as late effect of cerebrovascular accident (CVA) 10/21/2021   Abnormal gait due to muscle weakness 10/21/2021   Nausea without vomiting    Spastic hemiparesis (HCC)    History of hypertension    Neurogenic orthostatic hypotension (HCC)    Transaminitis    Right hemiplegia (HCC)    Hemorrhagic stroke (HCC)    Leucocytosis    Essential hypertension    Dyslipidemia    Right hemiparesis (HCC)    ICH (intracerebral hemorrhage) (HCC) 12/01/2020   HYPERLIPIDEMIA 07/06/2009   OBESITY 07/06/2009    ONSET DATE: 03/27/2023   REFERRING DIAG: H81.10 (ICD-10-CM) - BPPV (benign paroxysmal positional vertigo), unspecified laterality   THERAPY DIAG:  Dizziness and giddiness  Unsteadiness on feet  Other abnormalities of gait and mobility  Rationale for Evaluation and Treatment: Rehabilitation  SUBJECTIVE:  SUBJECTIVE STATEMENT: Got the bike and has been using it at home. Had a problem with her R leg sliding out. Ordered something to help keep her foot in place. Was sore after last session, but felt good to work her right leg. Has been getting on and off the floor with her exercises, does not want to work on this.   Pt accompanied by:  pt's [name]   PERTINENT HISTORY: PMH:  hx of R hemiplegia due to stroke in 10/22- secondary to L frontal ICH and small SAH- with associated spasticity, anxiety, Gilbert's syndrome, HLD  Per Dr. Pearlean Brownie: She presented to emergency room on 02/05/2023 with witnessed seizure activity by family.  She had been undergoing through a lot of stressful.  She unfortunately had just put down her dog that day..  She started developing jerking and left leg subsequently it spread to involve all 4 extremities and she was unresponsive for  2 minutes and seizure activity resolved.  She was subsequently somnolent and confused and returned to her baseline.  Patient was given Keppra 1 gram loading dose and subsequently discharged on 500 mg twice daily.  MRI scan of the brain was obtained which showed no acute abnormality.  Comprehensive metabolic panel labs and CBC were fairly unremarkable.  Patient is tolerating Keppra well without any significant side effects of breakthrough seizures.   However she is states that she has had 2 minor falls and head the left side of her head and which has led to relapse of her prior benign paroxysmal positional vertigo   PAIN:  Are you having pain? No  There were no vitals filed for this visit.   PRECAUTIONS: Fall  RED FLAGS: None   WEIGHT BEARING RESTRICTIONS: No  FALLS: Has patient fallen in last 6 months? Yes. Number of falls 2  LIVING ENVIRONMENT: Lives with: lives with their family and lives with their spouse Lives in: House/apartment Stairs: Yes: Internal: 20 steps; on right going up and on left going up Has following equipment at home: Quad cane small base, SPC with 4 prong tip   PLOF: Independent with household mobility with device and Independent with community mobility with device  PATIENT GOALS: Wants to get dizziness under control and getting strength back.   OBJECTIVE:  Note: Objective measures were completed at Evaluation unless otherwise noted.  DIAGNOSTIC FINDINGS: From 02/06/23: MRI shows 1. No acute intracranial process. 2. No definite acute hemorrhage is seen to correlate with the hyperdensity noted on the 02/05/2023 CT, which may have represented mineralization from more remote hemorrhage. Per radiologist's interpretation  COGNITION: Overall cognitive status: Within functional limits for tasks assessed   SENSATION: WFL  Reports they feel heavier   COORDINATION: Heel to shin: slower to perform with RLE   MUSCLE TONE: RLE: Hypertonic   POSTURE: rounded  shoulders  LOWER EXTREMITY ROM:    Decr AROM with RLE due to weakness   LOWER EXTREMITY MMT:    MMT Right Eval Left Eval  Hip flexion 4- 5  Hip extension    Hip abduction 4+ 5  Hip adduction 5 5  Hip internal rotation    Hip external rotation    Knee flexion 3- 5  Knee extension 4- (compensates with external rotation) 5  Ankle dorsiflexion 3- 5  Ankle plantarflexion    Ankle inversion    Ankle eversion    (Blank rows = not tested)  All tested in sitting    TRANSFERS: Assistive device utilized: None  Sit to stand:  SBA and CGA Stand to sit: SBA and CGA Pt to need BUE or single UE support to stand,pt anxious when performing.   Pt too fearful to stand without use of UE support, performs with more narrow BOS, needing 1-2 episodes of CGA initially for balance   GAIT: Gait pattern: step to pattern, step through pattern, decreased arm swing- Right, decreased step length- Left, decreased stance time- Right, decreased stride length, decreased hip/knee flexion- Right, decreased ankle dorsiflexion- Right, and Right foot flat Distance walked: Clinic distances  Assistive device utilized: Single point cane with 4 prong tip  Level of assistance: SBA and CGA Comments: Pt guarded during gait, with decr step length with LLE and decr stance time on RLE.   FUNCTIONAL TESTS:  5 times sit to stand: 17.22 seconds, pt using single and BUE support to stand 10 meter walk test: 46.69 seconds = .70 ft/sec with SPC with 4 prong tip                                                                                                                               TREATMENT DATE:     NMR:   Seated hip flexion/ABD over 5# kettlebell 10 reps no weight, 10 reps with 2# ankle weight, cues for slowed and controlled and not using UE support on mat table for balance, pt noting it was easier with coordination to perform with ankle weight compared to none  Split stance squats with RLE behind 5 reps with no  weight, 10 reps with 5# kettle bell, needing cues for proper technique and incr weight bearing posteriorly through RLE, CGA at times for balance. Discussed when performing squats at home to perform with chair/mat behind pt for incr posterior weight shift for more weight through RLE In // bars with green t-band around thighs: Side stepping down and back x3 reps for hip ABD, cues for larger step length sideways and proper alignment for hip ABD activation, pt using BUE support for balance Forward stepping and back to midline with RLE 10 reps, pt initially stepping too narrow, cued for wider step with RLE Backward stepping and back to midline with RLE 10 reps, also cued to try to make step a little wider and focus on glute/hamstring activation when stepping posteriorly   Gait with SPC with 4 prong tip during session with SBA/CGA  PATIENT EDUCATION: Education details: Continue HEP, working at a moderate intensity on bike at home  Person educated: Patient and Child(ren) Education method: Explanation, Demonstration, and Verbal cues Education comprehension: verbalized understanding, returned demonstration, and verbal cues required  HOME EXERCISE PROGRAM: Seated VOR x1 in horizontal and vertical direction for 30 seconds  Access Code: [email or web address] URL: [email or web address] Date: 04/24/2023 Prepared by: Sherlie Ban  Progressing balance tasks to perform on foam for HEP, making sure pt performs in the corner with family supervision   Exercises - Brandt-Daroff Vestibular Exercise  - 2 x daily - 5 x weekly -  4-5 reps - Romberg Stance with Eyes Closed  - 1 x daily - 5 x weekly - 3 sets - 30 hold - Standing Romberg to 3/4 Tandem Stance (Mirrored)  - 1 x daily - 5 x weekly - 3 sets - 30 hold - Romberg Stance with Head Rotation  - 1 x daily - 5 x weekly - 2 sets - 10 reps  GOALS: Goals reviewed with patient? Yes   UPDATED STGS/LTGS FOR RE-CERT SHORT TERM GOALS: Target  date:06/26/2023  1.  Pt will improve TUG time to 23 seconds or less in order to demo decrease fall risk.  Baseline: 25.8 seconds with SPC with 4 prong tip  Goal status: INITIAL  2.   Pt will improve gait speed with LRAD to at least 1.1 ft/sec in order to demo decr fall risk.  Baseline: 46.69 seconds = .70 ft/sec with SPC with 4 prong tip   37.8 seconds with SPC with 4 prong tip = .87 ft/sec Goal status: INITIAL     LONG TERM GOALS: Target date: 07/24/2023  Pt will be independent with final HEP for strength, dizziness, balance in order to build upon functional gains made in therapy. Baseline: will benefit from updates/additions as appropriate Goal status: ON-GOING  2.  Pt will report items as a 1-2/5 or less on MSQ in order to demo improved motion sensitivity.  Baseline: see chart above on 3/11  Goal status: ON-GOING  3.  Pt will improve TUG time to 19 seconds or less in order to demo decrease fall risk.  Baseline: 25.8 seconds with SPC with 4 prong tip  Goal status: REVISED  4.   Pt will improve 5x sit<>stand to less than or equal to 15 sec with no UE support to demonstrate improved functional strength and transfer efficiency.  Baseline: 18.7 seconds with no UE support Goal status: INITIAL  5.   Pt will improve gait speed with LRAD to at least 1.3 ft/sec in order to demo decr fall risk.  Baseline: 46.69 seconds = .70 ft/sec with SPC with 4 prong tip   37.8 seconds with SPC with 4 prong tip = .87 ft/sec Goal status: INITIAL    ASSESSMENT:  CLINICAL IMPRESSION:  Today's skilled session focused on RLE>LLE strengthening exercises in seated and standing position. Pt notes having less dizziness with daily tasks and wants to focus more on RLE strengthening and building strength up. With seated hip ABD/flexion over kettlebell, pt more challenged with coordination when performed without an ankle weight, requiring more control. Pt tolerated session well. Will continue per POC.    OBJECTIVE IMPAIRMENTS: Abnormal gait, decreased activity tolerance, decreased balance, decreased coordination, decreased endurance, decreased mobility, difficulty walking, decreased ROM, decreased strength, dizziness, increased muscle spasms, impaired flexibility, impaired tone, impaired UE functional use, and postural dysfunction.   ACTIVITY LIMITATIONS: carrying, lifting, standing, stairs, transfers, and locomotion level  PARTICIPATION LIMITATIONS: meal prep, cleaning, driving, and community activity  PERSONAL FACTORS: Age, Past/current experiences, Time since onset of injury/illness/exacerbation, and 3+ comorbidities: hx of R hemiplegia due to stroke in 10/22- secondary to L frontal ICH and small SAH- with associated spasticity, anxiety, Gilbert's syndrome, HLD, recent seizure  are also affecting patient's functional outcome.   REHAB POTENTIAL: Good  CLINICAL DECISION MAKING: Evolving/moderate complexity  EVALUATION COMPLEXITY: Moderate  PLAN:  PT FREQUENCY: 1x/week  PT DURATION: 8 weeks  PLANNED INTERVENTIONS: 97164- PT Re-evaluation, 97110-Therapeutic exercises, 97530- Therapeutic activity, [id number]- Neuromuscular re-education, 97535- Self Care, 56213- Manual therapy, [id number]- Gait training, [phone]- Orthotic  Fit/training, 54098- Canalith repositioning, Patient/Family education, Balance training, Stair training, Vestibular training, and DME instructions  PLAN FOR NEXT SESSION:   Vestibular exercises - work on horizontal saccades as these were a challenge for pt, RLE strengthening. Work on RLE weight shifting/bearing exercises, balance exercises in corner   Drake Leach, PT, DPT 06/12/2023, 12:38 PM

## 2023-06-19 ENCOUNTER — Encounter: Payer: Self-pay | Admitting: Physical Therapy

## 2023-06-19 ENCOUNTER — Ambulatory Visit: Attending: Neurology | Admitting: Physical Therapy

## 2023-06-19 DIAGNOSIS — R2681 Unsteadiness on feet: Secondary | ICD-10-CM | POA: Insufficient documentation

## 2023-06-19 DIAGNOSIS — R2689 Other abnormalities of gait and mobility: Secondary | ICD-10-CM | POA: Insufficient documentation

## 2023-06-19 DIAGNOSIS — M6281 Muscle weakness (generalized): Secondary | ICD-10-CM | POA: Diagnosis present

## 2023-06-19 NOTE — Therapy (Signed)
 OUTPATIENT PHYSICAL THERAPY NEURO TREATMENT  Patient Name: Felicia Chen MRN: [id number] DOB:11-May-1965, 58 y.o., female Today's Date: 06/19/2023   PCP: [name], PA-C  REFERRING PROVIDER: [name], MD   END OF SESSION:  PT End of Session - 06/19/23 1156     Visit Number 10    Number of Visits 16    Date for PT Re-Evaluation 07/28/23   per re-cert on 06/27/79   Authorization Type [employer]    PT Start Time 1154   pt late to session   PT Stop Time 1230    PT Time Calculation (min) 36 min    Equipment Utilized During Treatment Gait belt    Activity Tolerance Patient tolerated treatment well    Behavior During Therapy WFL for tasks assessed/performed;Anxious             Past Medical History:  Diagnosis Date   Anxiety    Gilbert's syndrome    Past Surgical History:  Procedure Laterality Date   ABDOMINAL HYSTERECTOMY N/A 06/11/2012   Procedure: HYSTERECTOMY ABDOMINAL;  Surgeon: [name], MD;  Location: [facility];  Service: Gynecology;  Laterality: N/A;   CESAREAN SECTION     x 4   CYSTOSCOPY N/A 06/11/2012   Procedure: CYSTOSCOPY;  Surgeon: [name], MD;  Location: [facility];  Service: Gynecology;  Laterality: N/A;   HERNIA REPAIR     LAPAROSCOPIC ASSISTED VAGINAL HYSTERECTOMY N/A 06/11/2012   Procedure: LAPAROSCOPIC ASSISTED VAGINAL HYSTERECTOMY atttempted;  Surgeon: [name], MD;  Location: [facility];  Service: Gynecology;  Laterality: N/A;  Attempted Laparoscopic assisted vaginal hysterectomy.   LAPAROSCOPIC LYSIS OF ADHESIONS N/A 06/11/2012   Procedure: LAPAROSCOPIC LYSIS OF ADHESIONS;  Surgeon: [name], MD;  Location: [facility];  Service: Gynecology;  Laterality: N/A;   SALPINGOOPHORECTOMY Bilateral 06/11/2012   Procedure: SALPINGO OOPHORECTOMY;  Surgeon: [name], MD;  Location: [facility];  Service: Gynecology;  Laterality: Bilateral;   Patient Active Problem List   Diagnosis Date Noted   Dizziness 05/09/2023    Generalized anxiety disorder 05/24/2022   Spasticity as late effect of cerebrovascular accident (CVA) 10/21/2021   Abnormal gait due to muscle weakness 10/21/2021   Nausea without vomiting    Spastic hemiparesis (HCC)    History of hypertension    Neurogenic orthostatic hypotension (HCC)    Transaminitis    Right hemiplegia (HCC)    Hemorrhagic stroke (HCC)    Leucocytosis    Essential hypertension    Dyslipidemia    Right hemiparesis (HCC)    ICH (intracerebral hemorrhage) (HCC) 12/01/2020   HYPERLIPIDEMIA 07/06/2009   OBESITY 07/06/2009    ONSET DATE: 03/27/2023   REFERRING DIAG: H81.10 (ICD-10-CM) - BPPV (benign paroxysmal positional vertigo), unspecified laterality   THERAPY DIAG:  Unsteadiness on feet  Muscle weakness (generalized)  Other abnormalities of gait and mobility  Rationale for Evaluation and Treatment: Rehabilitation  SUBJECTIVE:  SUBJECTIVE STATEMENT: No falls. Wants to ask questions about her exercises and wants to make sure that she is doing them right.   Pt accompanied by:  pt's [name]   PERTINENT HISTORY: PMH:  hx of R hemiplegia due to stroke in 10/22- secondary to L frontal ICH and small SAH- with associated spasticity, anxiety, Gilbert's syndrome, HLD  Per Dr. Pearlean Brownie: She presented to emergency room on 02/05/2023 with witnessed seizure activity by family.  She had been undergoing through a lot of stressful.  She unfortunately had just put down her dog that day..  She started developing jerking and left leg subsequently it spread to involve all 4 extremities and she was unresponsive for 2 minutes and seizure activity resolved.  She was subsequently somnolent and confused and returned to her baseline.  Patient was given Keppra 1 gram loading dose and subsequently  discharged on 500 mg twice daily.  MRI scan of the brain was obtained which showed no acute abnormality.  Comprehensive metabolic panel labs and CBC were fairly unremarkable.  Patient is tolerating Keppra well without any significant side effects of breakthrough seizures.   However she is states that she has had 2 minor falls and head the left side of her head and which has led to relapse of her prior benign paroxysmal positional vertigo   PAIN:  Are you having pain? No  There were no vitals filed for this visit.   PRECAUTIONS: Fall  RED FLAGS: None   WEIGHT BEARING RESTRICTIONS: No  FALLS: Has patient fallen in last 6 months? Yes. Number of falls 2  LIVING ENVIRONMENT: Lives with: lives with their family and lives with their spouse Lives in: House/apartment Stairs: Yes: Internal: 20 steps; on right going up and on left going up Has following equipment at home: Quad cane small base, SPC with 4 prong tip   PLOF: Independent with household mobility with device and Independent with community mobility with device  PATIENT GOALS: Wants to get dizziness under control and getting strength back.   OBJECTIVE:  Note: Objective measures were completed at Evaluation unless otherwise noted.  DIAGNOSTIC FINDINGS: From 02/06/23: MRI shows 1. No acute intracranial process. 2. No definite acute hemorrhage is seen to correlate with the hyperdensity noted on the 02/05/2023 CT, which may have represented mineralization from more remote hemorrhage. Per radiologist's interpretation  COGNITION: Overall cognitive status: Within functional limits for tasks assessed   SENSATION: WFL  Reports they feel heavier   COORDINATION: Heel to shin: slower to perform with RLE   MUSCLE TONE: RLE: Hypertonic   POSTURE: rounded shoulders  LOWER EXTREMITY ROM:    Decr AROM with RLE due to weakness   LOWER EXTREMITY MMT:    MMT Right Eval Left Eval  Hip flexion 4- 5  Hip extension    Hip abduction  4+ 5  Hip adduction 5 5  Hip internal rotation    Hip external rotation    Knee flexion 3- 5  Knee extension 4- (compensates with external rotation) 5  Ankle dorsiflexion 3- 5  Ankle plantarflexion    Ankle inversion    Ankle eversion    (Blank rows = not tested)  All tested in sitting    TRANSFERS: Assistive device utilized: None  Sit to stand: SBA and CGA Stand to sit: SBA and CGA Pt to need BUE or single UE support to stand,pt anxious when performing.   Pt too fearful to stand without use of UE support, performs with more narrow BOS,  needing 1-2 episodes of CGA initially for balance   GAIT: Gait pattern: step to pattern, step through pattern, decreased arm swing- Right, decreased step length- Left, decreased stance time- Right, decreased stride length, decreased hip/knee flexion- Right, decreased ankle dorsiflexion- Right, and Right foot flat Distance walked: Clinic distances  Assistive device utilized: Single point cane with 4 prong tip  Level of assistance: SBA and CGA Comments: Pt guarded during gait, with decr step length with LLE and decr stance time on RLE.   FUNCTIONAL TESTS:  5 times sit to stand: 17.22 seconds, pt using single and BUE support to stand 10 meter walk test: 46.69 seconds = .70 ft/sec with SPC with 4 prong tip                                                                                                                               TREATMENT DATE:     Therapeutic Activity:  Reviewed entirety of HEP, per pt request:  Verbally reviewed:  Forward and backwards step and weight shift with each leg, with BUE support Heel raises with BUE support Mini squats  Bridges with ball squeeze  Staggered stance sit <> stands with RLE  Seated marching with resistance band around thighs  Pt also using seated recumbent bike at home and walking, went over time on bike (starting at 15-20 minutes) and working at a moderate level of intensity. Also discussed can  try going backwards for incr hamstring activation   Access Code: [email or web address] URL: [email or web address] Date: 06/19/2023 Prepared by: Sherlie Ban  Reviewed balance exercises and verbally reviewed VOR, progressed standing balance to performing on foam   Exercises - Brandt-Daroff Vestibular Exercise  - 2 x daily - 5 x weekly - 4-5 reps - Standing Romberg to 3/4 Tandem Stance (Mirrored)  - 1 x daily - 5 x weekly - 3 sets - 30 hold - Standing Balance with Eyes Closed on Foam  - 1 x daily - 7 x weekly - 3 sets - 30 hold - Romberg Stance on Foam Pad with Head Rotation  - 1 x daily - 7 x weekly - 2 sets - 10 reps and head nods 10 reps   Gait with SPC with 4 prong tip during session with SBA/CGA  PATIENT EDUCATION: Education details: Reviewed entirety of HEP  Person educated: Patient and Child(ren) Education method: Explanation, Demonstration, and Verbal cues Education comprehension: verbalized understanding, returned demonstration, and verbal cues required  HOME EXERCISE PROGRAM: Seated VOR x1 in horizontal and vertical direction for 30 seconds  Access Code: [email or web address] URL: [email or web address] Date: 04/24/2023 Prepared by: Sherlie Ban  Progressing balance tasks to perform on foam for HEP, making sure pt performs in the corner with family supervision   Exercises - Brandt-Daroff Vestibular Exercise  - 2 x daily - 5 x weekly - 4-5 reps - Romberg Stance with Eyes Closed  - 1 x daily - 5 x weekly - 3 sets - 30 hold -  Standing Romberg to 3/4 Tandem Stance (Mirrored)  - 1 x daily - 5 x weekly - 3 sets - 30 hold - Romberg Stance with Head Rotation  - 1 x daily - 5 x weekly - 2 sets - 10 reps   GOALS: Goals reviewed with patient? Yes   UPDATED STGS/LTGS FOR RE-CERT SHORT TERM GOALS: Target date:06/26/2023  1.  Pt will improve TUG time to 23 seconds or less in order to demo decrease fall risk.  Baseline: 25.8 seconds with SPC with 4 prong tip  Goal  status: INITIAL  2.   Pt will improve gait speed with LRAD to at least 1.1 ft/sec in order to demo decr fall risk.  Baseline: 46.69 seconds = .70 ft/sec with SPC with 4 prong tip   37.8 seconds with SPC with 4 prong tip = .87 ft/sec Goal status: INITIAL     LONG TERM GOALS: Target date: 07/24/2023  Pt will be independent with final HEP for strength, dizziness, balance in order to build upon functional gains made in therapy. Baseline: will benefit from updates/additions as appropriate Goal status: ON-GOING  2.  Pt will report items as a 1-2/5 or less on MSQ in order to demo improved motion sensitivity.  Baseline: see chart above on 3/11  Goal status: ON-GOING  3.  Pt will improve TUG time to 19 seconds or less in order to demo decrease fall risk.  Baseline: 25.8 seconds with SPC with 4 prong tip  Goal status: REVISED  4.   Pt will improve 5x sit<>stand to less than or equal to 15 sec with no UE support to demonstrate improved functional strength and transfer efficiency.  Baseline: 18.7 seconds with no UE support Goal status: INITIAL  5.   Pt will improve gait speed with LRAD to at least 1.3 ft/sec in order to demo decr fall risk.  Baseline: 46.69 seconds = .70 ft/sec with SPC with 4 prong tip   37.8 seconds with SPC with 4 prong tip = .87 ft/sec Goal status: INITIAL    ASSESSMENT:  CLINICAL IMPRESSION:  Session limited today due to pt arriving late. Today's skilled session focused on reviewing entirety of HEP (per pt request). Went over RLE strengthening as well as corner balance. Progressed corner balance exercises to standing on foam for incr vestibular input. Pt feeling good with all her exercises. Will continue per POC.   OBJECTIVE IMPAIRMENTS: Abnormal gait, decreased activity tolerance, decreased balance, decreased coordination, decreased endurance, decreased mobility, difficulty walking, decreased ROM, decreased strength, dizziness, increased muscle spasms, impaired  flexibility, impaired tone, impaired UE functional use, and postural dysfunction.   ACTIVITY LIMITATIONS: carrying, lifting, standing, stairs, transfers, and locomotion level  PARTICIPATION LIMITATIONS: meal prep, cleaning, driving, and community activity  PERSONAL FACTORS: Age, Past/current experiences, Time since onset of injury/illness/exacerbation, and 3+ comorbidities: hx of R hemiplegia due to stroke in 10/22- secondary to L frontal ICH and small SAH- with associated spasticity, anxiety, Gilbert's syndrome, HLD, recent seizure  are also affecting patient's functional outcome.   REHAB POTENTIAL: Good  CLINICAL DECISION MAKING: Evolving/moderate complexity  EVALUATION COMPLEXITY: Moderate  PLAN:  PT FREQUENCY: 1x/week  PT DURATION: 8 weeks  PLANNED INTERVENTIONS: 97164- PT Re-evaluation, 97110-Therapeutic exercises, 97530- Therapeutic activity, 97112- Neuromuscular re-education, 97535- Self Care, [id number]- Manual therapy, [phone]- Gait training, [phone]- Orthotic Fit/training, [phone]- Canalith repositioning, Patient/Family education, Balance training, Stair training, Vestibular training, and DME instructions  PLAN FOR NEXT SESSION:   Vestibular exercises - work on horizontal saccades as these were  a challenge for pt, RLE strengthening. Work on RLE weight shifting/bearing exercises, balance exercises in corner   Drake Leach, PT, DPT 06/19/2023, 12:45 PM

## 2023-06-26 ENCOUNTER — Ambulatory Visit: Admitting: Physical Therapy

## 2023-07-02 ENCOUNTER — Encounter: Payer: Self-pay | Admitting: Physical Therapy

## 2023-07-03 ENCOUNTER — Ambulatory Visit: Admitting: Physical Therapy

## 2023-07-10 ENCOUNTER — Ambulatory Visit: Admitting: Physical Therapy

## 2023-07-17 ENCOUNTER — Ambulatory Visit: Admitting: Physical Therapy

## 2023-07-17 ENCOUNTER — Encounter: Payer: Self-pay | Admitting: Physical Therapy

## 2023-07-17 DIAGNOSIS — R2689 Other abnormalities of gait and mobility: Secondary | ICD-10-CM

## 2023-07-17 DIAGNOSIS — R2681 Unsteadiness on feet: Secondary | ICD-10-CM

## 2023-07-17 DIAGNOSIS — M6281 Muscle weakness (generalized): Secondary | ICD-10-CM

## 2023-07-17 NOTE — Therapy (Signed)
 OUTPATIENT PHYSICAL THERAPY NEURO TREATMENT  Patient Name: Felicia Chen MRN: [id number] DOB:1966-02-02, 58 y.o., female Today's Date: 07/17/2023   PCP: Darnelle Elders, PA-C  REFERRING PROVIDER: Lisabeth Rider, MD   END OF SESSION:  PT End of Session - 07/17/23 1152     Visit Number 11    Number of Visits 16    Date for PT Re-Evaluation 07/28/23   per re-cert on 06/27/79   Authorization Type [employer]    PT Start Time 1150   pt late to appt   PT Stop Time 1230    PT Time Calculation (min) 40 min    Equipment Utilized During Treatment Gait belt    Activity Tolerance Patient tolerated treatment well    Behavior During Therapy [facility] for tasks assessed/performed;Anxious             Past Medical History:  Diagnosis Date   Anxiety    Gilbert's syndrome    Past Surgical History:  Procedure Laterality Date   ABDOMINAL HYSTERECTOMY N/A 06/11/2012   Procedure: HYSTERECTOMY ABDOMINAL;  Surgeon: Jeanmarie Millet, MD;  Location: [facility];  Service: Gynecology;  Laterality: N/A;   CESAREAN SECTION     x 4   CYSTOSCOPY N/A 06/11/2012   Procedure: CYSTOSCOPY;  Surgeon: Jeanmarie Millet, MD;  Location: [facility];  Service: Gynecology;  Laterality: N/A;   HERNIA REPAIR     LAPAROSCOPIC ASSISTED VAGINAL HYSTERECTOMY N/A 06/11/2012   Procedure: LAPAROSCOPIC ASSISTED VAGINAL HYSTERECTOMY atttempted;  Surgeon: Jeanmarie Millet, MD;  Location: [facility];  Service: Gynecology;  Laterality: N/A;  Attempted Laparoscopic assisted vaginal hysterectomy.   LAPAROSCOPIC LYSIS OF ADHESIONS N/A 06/11/2012   Procedure: LAPAROSCOPIC LYSIS OF ADHESIONS;  Surgeon: Jeanmarie Millet, MD;  Location: [facility];  Service: Gynecology;  Laterality: N/A;   SALPINGOOPHORECTOMY Bilateral 06/11/2012   Procedure: SALPINGO OOPHORECTOMY;  Surgeon: Jeanmarie Millet, MD;  Location: [facility];  Service: Gynecology;  Laterality: Bilateral;   Patient Active Problem List   Diagnosis Date Noted   Dizziness 05/09/2023    Generalized anxiety disorder 05/24/2022   Spasticity as late effect of cerebrovascular accident (CVA) 10/21/2021   Abnormal gait due to muscle weakness 10/21/2021   Nausea without vomiting    Spastic hemiparesis (HCC)    History of hypertension    Neurogenic orthostatic hypotension (HCC)    Transaminitis    Right hemiplegia (HCC)    Hemorrhagic stroke (HCC)    Leucocytosis    Essential hypertension    Dyslipidemia    Right hemiparesis (HCC)    ICH (intracerebral hemorrhage) (HCC) 12/01/2020   HYPERLIPIDEMIA 07/06/2009   OBESITY 07/06/2009    ONSET DATE: 03/27/2023   REFERRING DIAG: H81.10 (ICD-10-CM) - BPPV (benign paroxysmal positional vertigo), unspecified laterality   THERAPY DIAG:  Unsteadiness on feet  Muscle weakness (generalized)  Other abnormalities of gait and mobility  Rationale for Evaluation and Treatment: Rehabilitation  SUBJECTIVE:  SUBJECTIVE STATEMENT: Had to miss recent visits due to being sick. Thinks she might have celiac or some kind of gluten intolerance. Stopped major gluten things and has felt better and not nauseous anymore. Dizziness has gone away, but still having the off balance. The really off balance has gotten better. Got an ankle brace for RLE to help with swelling and ankle stability. Feels like her R leg feels longer now and it is throwing her walking off. Feels like it is making a big difference. Just took a Xanax  before session today. One thing is he is having trouble with is getting R leg in and out of the car.   Pt accompanied by:  pt's daughter Carlon Chester   PERTINENT HISTORY: PMH:  hx of R hemiplegia due to stroke in 10/22- secondary to L frontal ICH and small SAH- with associated spasticity, anxiety, Gilbert's syndrome, HLD  Per Dr. Janett Medin: She presented to  emergency room on 02/05/2023 with witnessed seizure activity by family.  She had been undergoing through a lot of stressful.  She unfortunately had just put down her dog that day..  She started developing jerking and left leg subsequently it spread to involve all 4 extremities and she was unresponsive for 2 minutes and seizure activity resolved.  She was subsequently somnolent and confused and returned to her baseline.  Patient was given Keppra  1 gram loading dose and subsequently discharged on 500 mg twice daily.  MRI scan of the brain was obtained which showed no acute abnormality.  Comprehensive metabolic panel labs and CBC were fairly unremarkable.  Patient is tolerating Keppra  well without any significant side effects of breakthrough seizures.   However she is states that she has had 2 minor falls and head the left side of her head and which has led to relapse of her prior benign paroxysmal positional vertigo   PAIN:  Are you having pain? No  There were no vitals filed for this visit.   PRECAUTIONS: Fall  RED FLAGS: None   WEIGHT BEARING RESTRICTIONS: No  FALLS: Has patient fallen in last 6 months? Yes. Number of falls 2  LIVING ENVIRONMENT: Lives with: lives with their family and lives with their spouse Lives in: House/apartment Stairs: Yes: Internal: 20 steps; on right going up and on left going up Has following equipment at home: Quad cane small base, SPC with 4 prong tip   PLOF: Independent with household mobility with device and Independent with community mobility with device  PATIENT GOALS: Wants to get dizziness under control and getting strength back.   OBJECTIVE:  Note: Objective measures were completed at Evaluation unless otherwise noted.  DIAGNOSTIC FINDINGS: From 02/06/23: MRI shows 1. No acute intracranial process. 2. No definite acute hemorrhage is seen to correlate with the hyperdensity noted on the 02/05/2023 CT, which may have represented mineralization from  more remote hemorrhage. Per radiologist's interpretation  COGNITION: Overall cognitive status: Within functional limits for tasks assessed   SENSATION: WFL  Reports they feel heavier   COORDINATION: Heel to shin: slower to perform with RLE   MUSCLE TONE: RLE: Hypertonic   POSTURE: rounded shoulders  LOWER EXTREMITY ROM:    Decr AROM with RLE due to weakness   LOWER EXTREMITY MMT:    MMT Right Eval Left Eval  Hip flexion 4- 5  Hip extension    Hip abduction 4+ 5  Hip adduction 5 5  Hip internal rotation    Hip external rotation    Knee flexion 3- 5  Knee extension 4- (compensates with external rotation) 5  Ankle dorsiflexion 3- 5  Ankle plantarflexion    Ankle inversion    Ankle eversion    (Blank rows = not tested)  All tested in sitting    TRANSFERS: Assistive device utilized: None  Sit to stand: SBA and CGA Stand to sit: SBA and CGA Pt to need BUE or single UE support to stand,pt anxious when performing.   Pt too fearful to stand without use of UE support, performs with more narrow BOS, needing 1-2 episodes of CGA initially for balance   GAIT: Gait pattern: step to pattern, step through pattern, decreased arm swing- Right, decreased step length- Left, decreased stance time- Right, decreased stride length, decreased hip/knee flexion- Right, decreased ankle dorsiflexion- Right, and Right foot flat Distance walked: Clinic distances  Assistive device utilized: Single point cane with 4 prong tip  Level of assistance: SBA and CGA Comments: Pt guarded during gait, with decr step length with LLE and decr stance time on RLE.   FUNCTIONAL TESTS:  5 times sit to stand: 17.22 seconds, pt using single and BUE support to stand 10 meter walk test: 46.69 seconds = .70 ft/sec with SPC with 4 prong tip                                                                                                                               TREATMENT DATE:     Therapeutic  Activity: Pt asking about her recumbent bike at home and that RLE tends to slip out and she has trouble keeping it in the pedal, discussed having family member video tape at home so PT can better help problem solve a way to keep RLE on pedal  Pt recently got new ankle brace on RLE (started wearing it today) and reports it feels that RLE is longer than LLE, discussed initially trying to wear a sock on LLE to see if that makes it feel more even (as pt currently not wearing a sock on LLE) and that if that does not help then might benefit from maybe looking into a small heel lift    Initially attempted getting into tall kneeling on mat table with Ricke Charleston, pt stood from mat table and had UE support on chair when turning and then faced mat table and began to have BUE support on [employer] and stepped up LLE then RLE onto mat table. Pt's RLE started slipping due to tone and pt wearing leggings and then LLE started to slip out with pt then laying on mat table with legs extended off of table, with PT providing mod/max A, then 2nd PT came to help hold pt's legs and then pt rolled over onto her back at then sat up at edge of mat table   Instead of mat table, worked on tall kneeling on the floor, pt turning to face mat table and using BUE support on mat table and then  stepping LLE back to floor with CGA and pt then able to get into tall kneel position   In tall kneeling: 2 x 10 reps mini squats with BUE support, pt needing a rest break between each rep, pt reporting feeling nauseous afterwards, but was still willing to continue with exercises on the floor  With RLE as stance leg, 10 reps hip ABD with LLE with BUE support With getting up from the floor, pt stepping RLE forwards into half kneel (assist from therapist to place R foot in proper position) and then pushing up to stand with min A, pt then getting her balance and holding onto chair and then turning to sit down on mat table   In seated:  With 4# ankle  weight on LLE, 2 x 10 reps lateral step overs with hip flexion/ABD over 2" > 4" obstacle   At end of session, pt ambulating out with [facility] with 4 prong tip and HHA from daughter due to pt's legs feeling wobbly. Educated pt to have a seat and rest before leaving clinic, but pt stating that she was fine.   PATIENT EDUCATION: Education details: see therapeutic activity section above  Person educated: Patient and Child(ren) Education method: Explanation, Demonstration, and Verbal cues Education comprehension: verbalized understanding, returned demonstration, and verbal cues required  HOME EXERCISE PROGRAM: Seated VOR x1 in horizontal and vertical direction for 30 seconds  Access Code: [email or web address] URL: [email or web address] Date: 04/24/2023 Prepared by: Jonathan Neighbor  Progressing balance tasks to perform on foam for HEP, making sure pt performs in the corner with family supervision   Exercises - Brandt-Daroff Vestibular Exercise  - 2 x daily - 5 x weekly - 4-5 reps - Romberg Stance with Eyes Closed  - 1 x daily - 5 x weekly - 3 sets - 30 hold - Standing Romberg to 3/4 Tandem Stance (Mirrored)  - 1 x daily - 5 x weekly - 3 sets - 30 hold - Romberg Stance with Head Rotation  - 1 x daily - 5 x weekly - 2 sets - 10 reps   GOALS: Goals reviewed with patient? Yes   UPDATED STGS/LTGS FOR RE-CERT SHORT TERM GOALS: Target date:06/26/2023  1.  Pt will improve TUG time to 23 seconds or less in order to demo decrease fall risk.  Baseline: 25.8 seconds with SPC with 4 prong tip  Goal status: INITIAL  2.   Pt will improve gait speed with LRAD to at least 1.1 ft/sec in order to demo decr fall risk.  Baseline: 46.69 seconds = .70 ft/sec with SPC with 4 prong tip   37.8 seconds with SPC with 4 prong tip = .87 ft/sec Goal status: INITIAL     LONG TERM GOALS: Target date: 07/24/2023  Pt will be independent with final HEP for strength, dizziness, balance in order to build upon  functional gains made in therapy. Baseline: will benefit from updates/additions as appropriate Goal status: ON-GOING  2.  Pt will report items as a 1-2/5 or less on MSQ in order to demo improved motion sensitivity.  Baseline: see chart above on 3/11  Goal status: ON-GOING  3.  Pt will improve TUG time to 19 seconds or less in order to demo decrease fall risk.  Baseline: 25.8 seconds with SPC with 4 prong tip  Goal status: REVISED  4.   Pt will improve 5x sit<>stand to less than or equal to 15 sec with no UE support to demonstrate improved functional strength and transfer efficiency.  Baseline:  18.7 seconds with no UE support Goal status: INITIAL  5.   Pt will improve gait speed with LRAD to at least 1.3 ft/sec in order to demo decr fall risk.  Baseline: 46.69 seconds = .70 ft/sec with SPC with 4 prong tip   37.8 seconds with SPC with 4 prong tip = .87 ft/sec Goal status: INITIAL    ASSESSMENT:  CLINICAL IMPRESSION:  Pt returns to therapy today after missing a couple sessions due to being sick. Pt wearing new ankle brace on RLE that pt reports helps her ankle stability. Attempted to work on tall kneeling exercises on mat table, but due to RLE tone and pt having leggings on, was too slippery for pt to get into kneeling position and RLE with difficulty bending due to tone. Instead performed on the floor, with pt able to stand up and get down to the floor with min A. Pt reporting feeling some nausea with exercises on the floor, but was still willing to continue and try them. Pt going to re-start doing exercises at home now that she is feeling better. Will continue per POC.     OBJECTIVE IMPAIRMENTS: Abnormal gait, decreased activity tolerance, decreased balance, decreased coordination, decreased endurance, decreased mobility, difficulty walking, decreased ROM, decreased strength, dizziness, increased muscle spasms, impaired flexibility, impaired tone, impaired UE functional use, and  postural dysfunction.   ACTIVITY LIMITATIONS: carrying, lifting, standing, stairs, transfers, and locomotion level  PARTICIPATION LIMITATIONS: meal prep, cleaning, driving, and community activity  PERSONAL FACTORS: Age, Past/current experiences, Time since onset of injury/illness/exacerbation, and 3+ comorbidities: hx of R hemiplegia due to stroke in 10/22- secondary to L frontal ICH and small SAH- with associated spasticity, anxiety, Gilbert's syndrome, HLD, recent seizure  are also affecting patient's functional outcome.   REHAB POTENTIAL: Good  CLINICAL DECISION MAKING: Evolving/moderate complexity  EVALUATION COMPLEXITY: Moderate  PLAN:  PT FREQUENCY: 1x/week  PT DURATION: 8 weeks  PLANNED INTERVENTIONS: 97164- PT Re-evaluation, 97110-Therapeutic exercises, 97530- Therapeutic activity, [id number]- Neuromuscular re-education, 97535- Self Care, 11914- Manual therapy, [phone]- Gait training, [phone]- Orthotic Fit/training, [phone]- Canalith repositioning, Patient/Family education, Balance training, Stair training, Vestibular training, and DME instructions  PLAN FOR NEXT SESSION: check goals, re-cert or D/C?   Vestibular exercises - work on horizontal saccades as these were a challenge for pt, RLE strengthening. Work on RLE weight shifting/bearing exercises, balance exercises in corner   Se[place]ron [place], PT, DPT 07/17/2023, 2:31 PM

## 2023-07-24 ENCOUNTER — Ambulatory Visit: Attending: Neurology | Admitting: Physical Therapy

## 2023-07-24 ENCOUNTER — Encounter: Payer: Self-pay | Admitting: Physical Therapy

## 2023-07-24 DIAGNOSIS — M6281 Muscle weakness (generalized): Secondary | ICD-10-CM | POA: Diagnosis present

## 2023-07-24 DIAGNOSIS — R42 Dizziness and giddiness: Secondary | ICD-10-CM | POA: Diagnosis present

## 2023-07-24 DIAGNOSIS — R2689 Other abnormalities of gait and mobility: Secondary | ICD-10-CM | POA: Diagnosis present

## 2023-07-24 DIAGNOSIS — R2681 Unsteadiness on feet: Secondary | ICD-10-CM | POA: Diagnosis present

## 2023-07-24 NOTE — Therapy (Signed)
 OUTPATIENT PHYSICAL THERAPY NEURO TREATMENT/DISCHARGE SUMMARY  Patient Name: Felicia Chen MRN: [id number] DOB:[birth year], 58 y.o., female Today's Date: 07/24/2023   PCP: Darnelle Elders, PA-C  REFERRING PROVIDER: Lisabeth Rider, MD   END OF SESSION:  PT End of Session - 07/24/23 1155     Visit Number 12    Number of Visits 16    Date for PT Re-Evaluation 07/28/23   per re-cert on 5/36/64   Authorization Type [employer]    PT Start Time 1153   pt late to session   PT Stop Time 1231    PT Time Calculation (min) 38 min    Equipment Utilized During Treatment Gait belt    Activity Tolerance Patient tolerated treatment well    Behavior During Therapy WFL for tasks assessed/performed;Anxious             Past Medical History:  Diagnosis Date   Anxiety    Gilbert's syndrome    Past Surgical History:  Procedure Laterality Date   ABDOMINAL HYSTERECTOMY N/A 06/11/2012   Procedure: HYSTERECTOMY ABDOMINAL;  Surgeon: Jeanmarie Millet, MD;  Location: [facility];  Service: Gynecology;  Laterality: N/A;   CESAREAN SECTION     x 4   CYSTOSCOPY N/A 06/11/2012   Procedure: CYSTOSCOPY;  Surgeon: Jeanmarie Millet, MD;  Location: [facility];  Service: Gynecology;  Laterality: N/A;   HERNIA REPAIR     LAPAROSCOPIC ASSISTED VAGINAL HYSTERECTOMY N/A 06/11/2012   Procedure: LAPAROSCOPIC ASSISTED VAGINAL HYSTERECTOMY atttempted;  Surgeon: Jeanmarie Millet, MD;  Location: [facility];  Service: Gynecology;  Laterality: N/A;  Attempted Laparoscopic assisted vaginal hysterectomy.   LAPAROSCOPIC LYSIS OF ADHESIONS N/A 06/11/2012   Procedure: LAPAROSCOPIC LYSIS OF ADHESIONS;  Surgeon: Jeanmarie Millet, MD;  Location: [facility];  Service: Gynecology;  Laterality: N/A;   SALPINGOOPHORECTOMY Bilateral 06/11/2012   Procedure: SALPINGO OOPHORECTOMY;  Surgeon: Jeanmarie Millet, MD;  Location: [facility];  Service: Gynecology;  Laterality: Bilateral;   Patient Active Problem List   Diagnosis Date Noted   Dizziness  05/09/2023   Generalized anxiety disorder 05/24/2022   Spasticity as late effect of cerebrovascular accident (CVA) 10/21/2021   Abnormal gait due to muscle weakness 10/21/2021   Nausea without vomiting    Spastic hemiparesis (HCC)    History of hypertension    Neurogenic orthostatic hypotension (HCC)    Transaminitis    Right hemiplegia (HCC)    Hemorrhagic stroke (HCC)    Leucocytosis    Essential hypertension    Dyslipidemia    Right hemiparesis (HCC)    ICH (intracerebral hemorrhage) (HCC) 12/01/2020   HYPERLIPIDEMIA 07/06/2009   OBESITY 07/06/2009    ONSET DATE: 03/27/2023   REFERRING DIAG: H81.10 (ICD-10-CM) - BPPV (benign paroxysmal positional vertigo), unspecified laterality   THERAPY DIAG:  Unsteadiness on feet  Muscle weakness (generalized)  Other abnormalities of gait and mobility  Dizziness and giddiness  Rationale for Evaluation and Treatment: Rehabilitation  SUBJECTIVE:  SUBJECTIVE STATEMENT: Was able to lay on her stomach and bend her R leg for hamstring activation. Not having any dizziness, just feeling more off balance. Notes that her pool will be opening up soon. Got a video of her on the bike. Feels better wearing R ankle brace and a sock on the L foot.   Pt accompanied by:  pt's daughter Carlon Chester   PERTINENT HISTORY: PMH:  hx of R hemiplegia due to stroke in 10/22- secondary to L frontal ICH and small SAH- with associated spasticity, anxiety, Gilbert's syndrome, HLD  Per Dr. Janett Medin: She presented to emergency room on 02/05/2023 with witnessed seizure activity by family.  She had been undergoing through a lot of stressful.  She unfortunately had just put down her dog that day..  She started developing jerking and left leg subsequently it spread to involve all 4 extremities  and she was unresponsive for 2 minutes and seizure activity resolved.  She was subsequently somnolent and confused and returned to her baseline.  Patient was given Keppra  1 gram loading dose and subsequently discharged on 500 mg twice daily.  MRI scan of the brain was obtained which showed no acute abnormality.  Comprehensive metabolic panel labs and CBC were fairly unremarkable.  Patient is tolerating Keppra  well without any significant side effects of breakthrough seizures.   However she is states that she has had 2 minor falls and head the left side of her head and which has led to relapse of her prior benign paroxysmal positional vertigo   PAIN:  Are you having pain? No  There were no vitals filed for this visit.   PRECAUTIONS: Fall  RED FLAGS: None   WEIGHT BEARING RESTRICTIONS: No  FALLS: Has patient fallen in last 6 months? Yes. Number of falls 2  LIVING ENVIRONMENT: Lives with: lives with their family and lives with their spouse Lives in: House/apartment Stairs: Yes: Internal: 20 steps; on right going up and on left going up Has following equipment at home: Quad cane small base, SPC with 4 prong tip   PLOF: Independent with household mobility with device and Independent with community mobility with device  PATIENT GOALS: Wants to get dizziness under control and getting strength back.   OBJECTIVE:  Note: Objective measures were completed at Evaluation unless otherwise noted.  DIAGNOSTIC FINDINGS: From 02/06/23: MRI shows 1. No acute intracranial process. 2. No definite acute hemorrhage is seen to correlate with the hyperdensity noted on the 02/05/2023 CT, which may have represented mineralization from more remote hemorrhage. Per radiologist's interpretation  COGNITION: Overall cognitive status: Within functional limits for tasks assessed   SENSATION: WFL  Reports they feel heavier   COORDINATION: Heel to shin: slower to perform with RLE   MUSCLE TONE: RLE:  Hypertonic   POSTURE: rounded shoulders  LOWER EXTREMITY ROM:    Decr AROM with RLE due to weakness   LOWER EXTREMITY MMT:    MMT Right Eval Left Eval  Hip flexion 4- 5  Hip extension    Hip abduction 4+ 5  Hip adduction 5 5  Hip internal rotation    Hip external rotation    Knee flexion 3- 5  Knee extension 4- (compensates with external rotation) 5  Ankle dorsiflexion 3- 5  Ankle plantarflexion    Ankle inversion    Ankle eversion    (Blank rows = not tested)  All tested in sitting    TRANSFERS: Assistive device utilized: None  Sit to stand: SBA and CGA Stand to  sit: SBA and CGA Pt to need BUE or single UE support to stand,pt anxious when performing.   Pt too fearful to stand without use of UE support, performs with more narrow BOS, needing 1-2 episodes of CGA initially for balance   GAIT: Gait pattern: step to pattern, step through pattern, decreased arm swing- Right, decreased step length- Left, decreased stance time- Right, decreased stride length, decreased hip/knee flexion- Right, decreased ankle dorsiflexion- Right, and Right foot flat Distance walked: Clinic distances  Assistive device utilized: Single point cane with 4 prong tip  Level of assistance: SBA and CGA Comments: Pt guarded during gait, with decr step length with LLE and decr stance time on RLE.   FUNCTIONAL TESTS:  5 times sit to stand: 17.22 seconds, pt using single and BUE support to stand 10 meter walk test: 46.69 seconds = .70 ft/sec with SPC with 4 prong tip                                                                                                                               TREATMENT DATE:     Therapeutic Activity: Discussed bike that pt has at home as pt still with difficulty keeping her R foot in the strap, pt bought something off [employer] to help keep her foot on the strap, PT also provided pt with Dycem to see if that would help keep foot in the pedal, pt to try it  Discussed  POC going forwards - plan for D/C at this time for pt to continue to work on HEP at home and pt's pool is going to open soon that she will use for gait and previous aquatic PT, pt in agreement with this plan   Goal Assessment: 5x sit <> stand: 12 seconds with no UE support  TUG: 25.6 seconds with SPC with 4 prong tip Gait speed: 29.4 seconds with SPC with 4 prong tip = 1.12 ft/sec    Motion Sensitivity Quotient Intensity: 0 = none, 1 = Lightheaded, 2 = Mild, 3 = Moderate, 4 = Severe, 5 = Vomiting   Intensity (2/18) 3/11  5/6  1. Sitting to supine       2. Supine to L side       3. Supine to R side       4. Supine to sitting       5. L Hallpike-Dix (performed as L sidelying) 3 0   6. Up from L  3 3   7. R Hallpike-Dix (performed as R sidelying) 2.5 0   8. Up from R  3 3   9. Sitting, head tipped to L knee 0     10. Head up from L knee 0     11. Sitting, head tipped to R knee 0     12. Head up from R knee 0     13. Sitting head turns x5 2 3 0  14.Sitting head nods x5 1.5  2 0  15. In stance, 180 turn to L  1 1 0  16. In stance, 180 turn to R 2 1 0    PATIENT EDUCATION: Education details: see therapeutic activity section above, results of goals  Person educated: Patient and Child(ren) Education method: Explanation, Demonstration, and Verbal cues Education comprehension: verbalized understanding, returned demonstration, and verbal cues required  HOME EXERCISE PROGRAM: Seated VOR x1 in horizontal and vertical direction for 30 seconds  Access Code: [email or web address] URL: [email or web address] Date: 04/24/2023 Prepared by: Jonathan Neighbor  Progressing balance tasks to perform on foam for HEP, making sure pt performs in the corner with family supervision   Exercises - Brandt-Daroff Vestibular Exercise  - 2 x daily - 5 x weekly - 4-5 reps - Romberg Stance with Eyes Closed  - 1 x daily - 5 x weekly - 3 sets - 30 hold - Standing Romberg to 3/4 Tandem Stance (Mirrored)  - 1 x  daily - 5 x weekly - 3 sets - 30 hold - Romberg Stance with Head Rotation  - 1 x daily - 5 x weekly - 2 sets - 10 reps  PHYSICAL THERAPY DISCHARGE SUMMARY  Visits from Start of Care: 12  Current functional level related to goals / functional outcomes: See LTGs/Clinical Assessment Statement    Remaining deficits: Impaired balance, gait abnormalities, decr RLE strength, incr RLE ton   Education / Equipment: HEP   Patient agrees to discharge. Patient goals were partially met. Patient is being discharged due to being pleased with the current functional level.   GOALS: Goals reviewed with patient? Yes   UPDATED STGS/LTGS FOR RE-CERT SHORT TERM GOALS: Target date:06/26/2023  1.  Pt will improve TUG time to 23 seconds or less in order to demo decrease fall risk.  Baseline: 25.8 seconds with SPC with 4 prong tip  Goal status: INITIAL  2.   Pt will improve gait speed with LRAD to at least 1.1 ft/sec in order to demo decr fall risk.  Baseline: 46.69 seconds = .70 ft/sec with SPC with 4 prong tip   37.8 seconds with SPC with 4 prong tip = .87 ft/sec Goal status: INITIAL     LONG TERM GOALS: Target date: 07/24/2023  Pt will be independent with final HEP for strength, dizziness, balance in order to build upon functional gains made in therapy. Baseline: pt has been working on HEP  Goal status: MET  2.  Pt will report items as a 1-2/5 or less on MSQ in order to demo improved motion sensitivity.  Baseline: see chart above on 3/11   See chart above on 5/6 Goal status: MET  3.  Pt will improve TUG time to 19 seconds or less in order to demo decrease fall risk.  Baseline: 25.8 seconds with SPC with 4 prong tip   25.6 seconds with SPC with 4 prong tip (5/6) Goal status: NOT MET  4.   Pt will improve 5x sit<>stand to less than or equal to 15 sec with no UE support to demonstrate improved functional strength and transfer efficiency.  Baseline: 18.7 seconds with no UE support  12  seconds with no UE support (5/6) Goal status: MET  5.   Pt will improve gait speed with LRAD to at least 1.3 ft/sec in order to demo decr fall risk.  Baseline: 46.69 seconds = .70 ft/sec with SPC with 4 prong tip   37.8 seconds with SPC with 4 prong tip = .87 ft/sec  29.4 seconds with SPC  with 4 prong tip = 1.12 ft/sec (5/6) Goal status: NOT MET    ASSESSMENT:  CLINICAL IMPRESSION:  Today's skilled session focused on assessing LTGs. Pt has met 3 out of 5 LTGs in regards to HEP, MSQ, and 5x sit <> stand.  Pt improved gait speed with SPC with 4 prong tip, but not quite to goal level. Pt is pleased with her current progress and is in agreement to D/C at this time. Pt in agreement to take a break from PT for a few months and work on HEP at home as well as aquatic HEP once her pool opens.   OBJECTIVE IMPAIRMENTS: Abnormal gait, decreased activity tolerance, decreased balance, decreased coordination, decreased endurance, decreased mobility, difficulty walking, decreased ROM, decreased strength, dizziness, increased muscle spasms, impaired flexibility, impaired tone, impaired UE functional use, and postural dysfunction.   ACTIVITY LIMITATIONS: carrying, lifting, standing, stairs, transfers, and locomotion level  PARTICIPATION LIMITATIONS: meal prep, cleaning, driving, and community activity  PERSONAL FACTORS: Age, Past/current experiences, Time since onset of injury/illness/exacerbation, and 3+ comorbidities: hx of R hemiplegia due to stroke in 10/22- secondary to L frontal ICH and small SAH- with associated spasticity, anxiety, Gilbert's syndrome, HLD, recent seizure  are also affecting patient's functional outcome.   REHAB POTENTIAL: Good  CLINICAL DECISION MAKING: Evolving/moderate complexity  EVALUATION COMPLEXITY: Moderate  PLAN:  PT FREQUENCY: 1x/week  PT DURATION: 8 weeks  PLANNED INTERVENTIONS: 97164- PT Re-evaluation, 97110-Therapeutic exercises, 97530- Therapeutic activity,  97112- Neuromuscular re-education, 97535- Self Care, 91478- Manual therapy, [phone]- Gait training, [phone]- Orthotic Fit/training, [phone]- Canalith repositioning, Patient/Family education, Balance training, Stair training, Vestibular training, and DME instructions  PLAN FOR NEXT SESSION: D/C   Se[place]ron [place], PT, DPT 07/24/2023, 12:45 PM

## 2023-08-13 ENCOUNTER — Other Ambulatory Visit: Payer: Self-pay | Admitting: Neurology

## 2023-09-12 NOTE — Progress Notes (Unsigned)
 [facility] [address] [place]. [place] [address] [phone]       OFFICE FOLLOW UP NOTE  Felicia Chen Date of Birth:  1965-09-20 Medical Record Number:  [id number]   Primary neurologist: Dr. Rosemarie Reason for visit: seizure, stroke   SUBJECTIVE:   CHIEF COMPLAINT:  No chief complaint on file.   HPI:   Update 09/13/2023 JM: Patient returns for follow-up visit after prior visit 6 months ago.  Denies any additional seizure activity.  Remains on Keppra  500 mg twice daily.  EEG 04/2023 without evidence of seizure activity.  No new stroke/TIA symptoms.  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    Previously working with PT for BPPV, chronic hx with recurrent symptoms after head injury last year.     History provided for reference purposes only Update 03/27/2023 Dr. Rosemarie: Felicia Chen is a 58 year old pleasant Caucasian lady seen today for office consultation visit for seizure.  She is accompanied by 2 of her daughters.  History is obtained from them and review of electronic medical records.  I personally reviewed pertinent available imaging films in PACS.  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  She presented to emergency room on 02/05/2023 with witnessed seizure activity by family.  She had been undergoing through a lot of stressful.  She unfortunately had just put down her dog that day..  She started developing jerking and left leg subsequently it spread to involve all 4 extremities and she was unresponsive for 2 minutes and seizure activity resolved.  She was subsequently somnolent and confused and returned to her baseline in about 2025 minutes.  Patient was given Keppra  1 gram loading dose and subsequently discharged on 500 mg twice daily.  MRI scan of the brain was obtained which  showed no acute abnormality.  Comprehensive metabolic panel labs and CBC were fairly unremarkable.  Patient is tolerating Keppra  well without any significant side effects of breakthrough seizures.  However she is states that she has had 2 minor falls and head the left side of her head and which has led to relapse of her prior benign paroxysmal positional vertigo.  She had obtained good benefit with vestibular stabilization exercises when she had been referred to physical therapy in the past for this.  She denies any prior history of seizures.  There is no family history of seizures. She has past neurological history of left frontal parenchymal hemorrhage of indeterminate etiology-hypertension versus RCVS versus small vessel disease following COVID infection.  Follow-up MRI scan of the brain on 03/11/2021 showed stable expected evolutionary changes without any underlying mass lesion.  She was seen in office last by [facility] nurse practitioner on 02/08/2021.  Initial visit 02/08/2021 JM: Patient being seen for initial hospital follow-up accompanied by her husband and sister.    She has been making gradual improvement since discharge home Reports residual right hemiparesis and gait impairment -recently completed 3 weeks of HH PT/OT/SLP and scheduled for eval at neuro rehab by PT/OT/SLP on 11/30.  She does ambulate short distance with RW with 1 person assist otherwise will use w/c for long distance -denies any recent falls Does experience stiffness greater in hand and ankle (especially upon awakening) and tremor in RUE and RLE Believes her mood has been stable but still can experience some depression and anxiety.  She was started on citalopram  during CIR admission and as  needed Xanax  and BuSpar . - she questions ongoing use of citalopram  as her prior SSRI d/c'd due to possible cause of possible RCVS - she questions if there are any other options for treatment Lives with her husband who assists as needed - very  supportive family Denies new stroke/TIA symptoms  Compliant on atorvastatin  80 mg daily -denies side effects Blood pressure today 129/78 - use of Florinef  as needed but has not recently used Denies any smoking history, EtOH or drug use  Of note, sister mentions that she personally underwent embolization of cerebral aneurysm and father has hx of carotid stenosis requiring intervention.      Stroke admission 12/01/2020 Felicia Chen is a 58 y.o. female with history of significant for hyperlipidemia, obesity (BMI 39.5), and anxiety who presented on 12/01/2020 with confusion as well as headache in the setting of recent COVID infection.  Personally reviewed hospitalization pertinent progress notes, lab work and imaging.  Evaluated by Dr. Rosemarie for left frontal lobe ICH with small SAH, etiology unclear, possibly HTN vs small vessel disease given multiple risk factors vs ?RCVS.  CTA head/neck no evidence of vascular lesion underlying the lobar hematoma although scattered segmental narrowing suspect RCVS or similar vasculopathy.  Questional possibility RCVS triggered by sertraline  and COVID infection.  Recommended repeat CTA in 2 weeks to reevaluate for cerebral vasoconstriction versus atherosclerotic stenosis.  MRV and CTV no evidence of CVST.  Repeat CT head stable hematoma with mild SAH.  EF 60 to 65%.  TCD no evidence of vasospasm.  LDL 212.  A1c 5.4. UDS negative.  Cardiolipin antibodies negative.  No prior history of HTN, BP treated with Cleviprex  and initiated losartan .  Initiate atorvastatin  80 mg daily.  No prior stroke history.  Hospital course complicated by a fall on 9/18 with right facial and eyelid bruising and edema - CT head with no change.  Sertraline  discontinued due to suspicion of RCVS and ultimately placed on BuSpar  and as needed Atarax  and Xanax  (until BuSpar  kicks in).  Residual deficits of right hemiparesis and gait impairment.  Therapy eval's recommended CIR for functional  decline. During CIR admission, bouts of orthostasis with improvement after Florinef  and ProAmatine  and neurogenic bladder and Urecholine  initiated.  PERTINENT IMAGING  Per recent hospitalization CT head 1. 60 cc left frontal parietal lobar hematoma with regiona subarachnoid extension. CTA head & neck  . No spot sign or discrete vascular lesion underlying the lobar hematoma. 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 CTV negative for CVST MRI  brain : no evidence temporal lobe ischemia as initially deszcribed on head CT. Continues to show stable size of left parietal IPH with subarachnoid extension overlying bilateral cerebral hemispheres are well as cerebellar regions. Mild edema, no midline shift.  MRV: no evidence CVST  CT head 9/18 -stable hematoma and mild SAH 2D Echo LVEF 60-65%, no regional wall abnormalities, LVH which is mild, grade I diastolic dysfunction TCDs no evidence of vasospasm  LDL 212 HgbA1c 5.4    ROS:   14 system review of systems performed and negative with exception of those listed in HPI  PMH:  Past Medical History:  Diagnosis Date   Anxiety    Gilbert's syndrome     PSH:  Past Surgical History:  Procedure Laterality Date   ABDOMINAL HYSTERECTOMY N/A 06/11/2012   Procedure: HYSTERECTOMY ABDOMINAL;  Surgeon: Lynwood FORBES Curlene PONCE, MD;  Location: [facility];  Service: Gynecology;  Laterality: N/A;   CESAREAN SECTION  x 4   CYSTOSCOPY N/A 06/11/2012   Procedure: CYSTOSCOPY;  Surgeon: Lynwood FORBES Curlene PONCE, MD;  Location: [facility];  Service: Gynecology;  Laterality: N/A;   HERNIA REPAIR     LAPAROSCOPIC ASSISTED VAGINAL HYSTERECTOMY N/A 06/11/2012   Procedure: LAPAROSCOPIC ASSISTED VAGINAL HYSTERECTOMY atttempted;  Surgeon: Lynwood FORBES Curlene PONCE, MD;  Location: [facility];  Service: Gynecology;  Laterality: N/A;  Attempted Laparoscopic assisted vaginal hysterectomy.   LAPAROSCOPIC LYSIS OF ADHESIONS N/A 06/11/2012    Procedure: LAPAROSCOPIC LYSIS OF ADHESIONS;  Surgeon: Lynwood FORBES Curlene PONCE, MD;  Location: [facility];  Service: Gynecology;  Laterality: N/A;   SALPINGOOPHORECTOMY Bilateral 06/11/2012   Procedure: SALPINGO OOPHORECTOMY;  Surgeon: Lynwood FORBES Curlene PONCE, MD;  Location: [facility];  Service: Gynecology;  Laterality: Bilateral;    Social History:  Social History   Socioeconomic History   Marital status: Married    Spouse name: Not on file   Number of children: 5   Years of education: Not on file   Highest education level: Not on file  Occupational History   Not on file  Tobacco Use   Smoking status: Never   Smokeless tobacco: Never  Vaping Use   Vaping status: Never Used  Substance and Sexual Activity   Alcohol use: Not Currently    Comment: rarely   Drug use: No   Sexual activity: Yes    Partners: Male  Other Topics Concern   Not on file  Social History Narrative   Not on file   Social Drivers of Health   Financial Resource Strain: Not on file  Food Insecurity: Not on file  Transportation Needs: Not on file  Physical Activity: Not on file  Stress: Not on file  Social Connections: Not on file  Intimate Partner Violence: Not on file    Family History: No family history on file.  Medications:   Current Outpatient Medications on File Prior to Visit  Medication Sig Dispense Refill   acetaminophen  (TYLENOL ) 325 MG tablet Take 2 tablets (650 mg total) by mouth every 4 (four) hours as needed for mild pain (or temp > 37.5 C (99.5 F)). (Patient not taking: Reported on 05/07/2023)     ALPRAZolam  (XANAX ) 0.5 MG tablet Take 0.5 mg by mouth 2 (two) times daily as needed. (Patient not taking: Reported on 05/07/2023)     atorvastatin  (LIPITOR ) 80 MG tablet 1 tablet Orally Once a day     baclofen  (LIORESAL ) 10 MG tablet TAKE 1 TABLET BY MOUTH AT BEDTIME (Patient not taking: Reported on 05/07/2023) 90 tablet 5   COVID-19 mRNA bivalent vaccine, Moderna, (MODERNA COVID-19 BIVAL BOOSTER) 50 MCG/0.5ML  injection Inject into the muscle. (Patient not taking: Reported on 05/07/2023) 0.5 mL 0   gabapentin (NEURONTIN) 100 MG capsule Take 200 mg by mouth at bedtime. 1 per day     gabapentin (NEURONTIN) 300 MG capsule Take 300 mg by mouth 2 (two) times daily. (Patient not taking: Reported on 03/27/2023)     levETIRAcetam  (KEPPRA ) 500 MG tablet Take 1 tablet by mouth twice daily 60 tablet 0   meclizine  (ANTIVERT ) 12.5 MG tablet Take 1 tablet (12.5 mg total) by mouth 3 (three) times daily as needed for dizziness. (Patient not taking: Reported on 03/27/2023) 90 tablet 1   sertraline  (ZOLOFT ) 100 MG tablet Take 100 mg by mouth in the morning and at bedtime.     TRINTELLIX 20 MG TABS tablet Take 20 mg by mouth daily. (Patient not taking: Reported on 03/27/2023)  zolpidem  (AMBIEN ) 10 MG tablet 1 tablet at bedtime as needed Orally Once a day (Patient not taking: Reported on 03/27/2023)     No current facility-administered medications on file prior to visit.    Allergies:  No Known Allergies    OBJECTIVE:  Physical Exam  There were no vitals filed for this visit.  There is no height or weight on file to calculate BMI. No results found.  General: well developed, well nourished, very pleasant middle-age Caucasian female, seated, in no evident distress Head: head normocephalic and atraumatic.   Neck: supple with no carotid or supraclavicular bruits Cardiovascular: regular rate and rhythm, no murmurs Musculoskeletal: no deformity Skin:  no rash/petichiae Vascular:  Normal pulses all extremities   Neurologic Exam Mental Status: Awake and fully alert.  Fluent speech and language.  Oriented to place and time. Recent and remote memory intact. Attention span, concentration and fund of knowledge appropriate during visit. Mood and affect appropriate.  Cranial Nerves: Fundoscopic exam reveals sharp disc margins. Pupils equal, briskly reactive to light. Extraocular movements full without nystagmus. Visual fields  full to confrontation. Hearing intact. Facial sensation intact.  Mild right lower facial weakness.  Tongue, and palate moves normally and symmetrically.  Motor: Normal strength, bulk and tone on left side RUE: 3/5 deltoid, 3+/5 elbow extension and flexion, 2/5 grip (although limited assessment due to pain) -increased tone throughout and mild tremor with activation RLE: 4/5 HF, KE and KF, 1/5 ADF and APF- mild tremor with activation Sensory.: intact to touch , pinprick , position and vibratory sensation.  Coordination: Rapid alternating movements normal on left side. Finger-to-nose and heel-to-shin performed accurately on left side. Gait and Station: deferred as RW not present Reflexes: 2+ RUE and RLE, 1+ LUE and LLE. Toes downgoing.          ASSESSMENT: Felicia Chen is a 58 y.o. 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 She also has a history of benign paroxysmal positional vertigo with recent flareup following minor head trauma. She has underlying gait difficulties likely due to significant underlying anxiety.      PLAN:  Seizure, late effect of stroke: No additional seizures since 01/2023 Continue keppra  500mg  twice daily EEG 04/2023 no evidence of seizure activity Discussed avoidance of seizure provoking triggers and importance of medication compliance  L frontal ICH :  Residual deficit: Right spastic hemiparesis and gait impairment. Start outpatient PT/OT/SLP on 11/30. Discussed spasticity and hopeful improvement with therapies. Followed by PMR Repeat MR brain w/wo contrast to assess for underlying structures possibly contributing Continue atorvastatin  80 mg daily for secondary stroke prevention.   Discussed secondary stroke prevention measures and importance of close PCP follow up for aggressive stroke risk factor management. I have gone over the pathophysiology  of stroke, warning signs and symptoms, risk factors and their management in some detail with instructions to go to the closest emergency room for symptoms of concern. 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    Harlene Bogaert, AGNP-BC  [facility] [address] [place], [place] [address]  Phone [phone] Fax [phone] Note: This document was prepared with digital dictation and possible  smart Lobbyist. Any transcriptional errors that result from this process are unintentional.

## 2023-09-13 ENCOUNTER — Encounter: Payer: Self-pay | Admitting: Adult Health

## 2023-09-13 ENCOUNTER — Telehealth: Payer: 59 | Admitting: Adult Health

## 2023-09-13 DIAGNOSIS — R569 Unspecified convulsions: Secondary | ICD-10-CM

## 2023-09-13 DIAGNOSIS — I69398 Other sequelae of cerebral infarction: Secondary | ICD-10-CM | POA: Diagnosis not present

## 2023-09-13 DIAGNOSIS — I612 Nontraumatic intracerebral hemorrhage in hemisphere, unspecified: Secondary | ICD-10-CM

## 2023-09-13 MED ORDER — LEVETIRACETAM 500 MG PO TABS: 500.0000 mg | ORAL_TABLET | Freq: Two times a day (BID) | ORAL | 3 refills | Status: AC

## 2024-04-07 ENCOUNTER — Telehealth: Admitting: Adult Health

## 2024-05-22 ENCOUNTER — Telehealth: Payer: Self-pay | Admitting: Adult Health
